# Patient Record
Sex: Male | Born: 1960 | Race: Black or African American | Hispanic: No | Marital: Single | Smoking: Current every day smoker
Health system: Southern US, Community
[De-identification: ages and names within clinical notes are randomized; demographics above are authoritative.]

## PROBLEM LIST (undated history)

## (undated) DIAGNOSIS — A630 Anogenital (venereal) warts: Secondary | ICD-10-CM

## (undated) DIAGNOSIS — B20 Human immunodeficiency virus [HIV] disease: Secondary | ICD-10-CM

## (undated) DIAGNOSIS — M4802 Spinal stenosis, cervical region: Secondary | ICD-10-CM

## (undated) DIAGNOSIS — Z21 Asymptomatic human immunodeficiency virus [HIV] infection status: Secondary | ICD-10-CM

## (undated) HISTORY — PX: TENDON REPAIR: SHX5111

## (undated) HISTORY — DX: Anogenital (venereal) warts: A63.0

---

## 1995-01-10 HISTORY — PX: HERNIA REPAIR: SHX51

## 2001-05-21 ENCOUNTER — Encounter: Payer: Self-pay | Admitting: Emergency Medicine

## 2001-05-21 ENCOUNTER — Emergency Department (HOSPITAL_COMMUNITY): Admission: EM | Admit: 2001-05-21 | Discharge: 2001-05-21 | Payer: Self-pay | Admitting: Emergency Medicine

## 2001-05-23 ENCOUNTER — Emergency Department (HOSPITAL_COMMUNITY): Admission: EM | Admit: 2001-05-23 | Discharge: 2001-05-24 | Payer: Self-pay | Admitting: Emergency Medicine

## 2001-05-24 ENCOUNTER — Encounter: Payer: Self-pay | Admitting: Emergency Medicine

## 2001-05-27 ENCOUNTER — Encounter: Payer: Self-pay | Admitting: Emergency Medicine

## 2001-05-27 ENCOUNTER — Emergency Department (HOSPITAL_COMMUNITY): Admission: EM | Admit: 2001-05-27 | Discharge: 2001-05-27 | Payer: Self-pay | Admitting: Emergency Medicine

## 2002-01-25 ENCOUNTER — Emergency Department (HOSPITAL_COMMUNITY): Admission: EM | Admit: 2002-01-25 | Discharge: 2002-01-26 | Payer: Self-pay | Admitting: Emergency Medicine

## 2002-02-07 ENCOUNTER — Inpatient Hospital Stay (HOSPITAL_COMMUNITY): Admission: AD | Admit: 2002-02-07 | Discharge: 2002-02-11 | Payer: Self-pay | Admitting: Psychiatry

## 2002-03-25 ENCOUNTER — Encounter: Payer: Self-pay | Admitting: Emergency Medicine

## 2002-03-25 ENCOUNTER — Inpatient Hospital Stay (HOSPITAL_COMMUNITY): Admission: EM | Admit: 2002-03-25 | Discharge: 2002-03-31 | Payer: Self-pay | Admitting: Psychiatry

## 2002-04-20 ENCOUNTER — Emergency Department (HOSPITAL_COMMUNITY): Admission: EM | Admit: 2002-04-20 | Discharge: 2002-04-20 | Payer: Self-pay | Admitting: Emergency Medicine

## 2002-06-17 ENCOUNTER — Encounter: Payer: Self-pay | Admitting: *Deleted

## 2002-06-18 ENCOUNTER — Inpatient Hospital Stay (HOSPITAL_COMMUNITY): Admission: EM | Admit: 2002-06-18 | Discharge: 2002-06-26 | Payer: Self-pay | Admitting: Psychiatry

## 2002-06-27 ENCOUNTER — Emergency Department (HOSPITAL_COMMUNITY): Admission: EM | Admit: 2002-06-27 | Discharge: 2002-06-27 | Payer: Self-pay | Admitting: Emergency Medicine

## 2002-07-18 ENCOUNTER — Encounter: Payer: Self-pay | Admitting: Emergency Medicine

## 2002-07-18 ENCOUNTER — Inpatient Hospital Stay (HOSPITAL_COMMUNITY): Admission: EM | Admit: 2002-07-18 | Discharge: 2002-07-25 | Payer: Self-pay | Admitting: Psychiatry

## 2003-02-17 ENCOUNTER — Encounter: Payer: Self-pay | Admitting: *Deleted

## 2003-02-18 ENCOUNTER — Inpatient Hospital Stay (HOSPITAL_COMMUNITY): Admission: EM | Admit: 2003-02-18 | Discharge: 2003-02-24 | Payer: Self-pay | Admitting: Psychiatry

## 2003-02-18 ENCOUNTER — Encounter (HOSPITAL_COMMUNITY): Payer: Self-pay | Admitting: Psychiatry

## 2003-03-25 ENCOUNTER — Emergency Department (HOSPITAL_COMMUNITY): Admission: EM | Admit: 2003-03-25 | Discharge: 2003-03-25 | Payer: Self-pay | Admitting: Emergency Medicine

## 2003-04-04 ENCOUNTER — Emergency Department (HOSPITAL_COMMUNITY): Admission: EM | Admit: 2003-04-04 | Discharge: 2003-04-04 | Payer: Self-pay | Admitting: Emergency Medicine

## 2003-05-12 ENCOUNTER — Emergency Department (HOSPITAL_COMMUNITY): Admission: EM | Admit: 2003-05-12 | Discharge: 2003-05-12 | Payer: Self-pay | Admitting: Emergency Medicine

## 2003-10-14 ENCOUNTER — Inpatient Hospital Stay (HOSPITAL_COMMUNITY): Admission: EM | Admit: 2003-10-14 | Discharge: 2003-10-22 | Payer: Self-pay | Admitting: Psychiatry

## 2003-10-14 ENCOUNTER — Ambulatory Visit: Payer: Self-pay | Admitting: Psychiatry

## 2003-10-26 ENCOUNTER — Ambulatory Visit: Payer: Self-pay | Admitting: Internal Medicine

## 2003-11-05 ENCOUNTER — Ambulatory Visit: Payer: Self-pay | Admitting: Internal Medicine

## 2003-11-17 ENCOUNTER — Ambulatory Visit: Payer: Self-pay | Admitting: Internal Medicine

## 2003-12-09 ENCOUNTER — Emergency Department (HOSPITAL_COMMUNITY): Admission: EM | Admit: 2003-12-09 | Discharge: 2003-12-09 | Payer: Self-pay | Admitting: Emergency Medicine

## 2004-01-31 ENCOUNTER — Inpatient Hospital Stay (HOSPITAL_COMMUNITY): Admission: EM | Admit: 2004-01-31 | Discharge: 2004-02-11 | Payer: Self-pay | Admitting: Psychiatry

## 2004-01-31 ENCOUNTER — Encounter: Payer: Self-pay | Admitting: Emergency Medicine

## 2004-01-31 ENCOUNTER — Ambulatory Visit: Payer: Self-pay | Admitting: Psychiatry

## 2004-03-05 ENCOUNTER — Inpatient Hospital Stay (HOSPITAL_COMMUNITY): Admission: EM | Admit: 2004-03-05 | Discharge: 2004-03-11 | Payer: Self-pay | Admitting: Psychiatry

## 2004-03-05 ENCOUNTER — Ambulatory Visit: Payer: Self-pay | Admitting: Psychiatry

## 2004-03-15 ENCOUNTER — Emergency Department (HOSPITAL_COMMUNITY): Admission: EM | Admit: 2004-03-15 | Discharge: 2004-03-15 | Payer: Self-pay | Admitting: Cardiology

## 2004-04-13 ENCOUNTER — Emergency Department (HOSPITAL_COMMUNITY): Admission: EM | Admit: 2004-04-13 | Discharge: 2004-04-13 | Payer: Self-pay | Admitting: Emergency Medicine

## 2004-05-28 ENCOUNTER — Ambulatory Visit: Payer: Self-pay | Admitting: Psychiatry

## 2004-05-28 ENCOUNTER — Encounter: Payer: Self-pay | Admitting: Emergency Medicine

## 2004-05-28 ENCOUNTER — Inpatient Hospital Stay (HOSPITAL_COMMUNITY): Admission: AD | Admit: 2004-05-28 | Discharge: 2004-06-06 | Payer: Self-pay | Admitting: Psychiatry

## 2004-06-25 ENCOUNTER — Emergency Department (HOSPITAL_COMMUNITY): Admission: EM | Admit: 2004-06-25 | Discharge: 2004-06-25 | Payer: Self-pay | Admitting: Emergency Medicine

## 2004-07-08 ENCOUNTER — Emergency Department (HOSPITAL_COMMUNITY): Admission: EM | Admit: 2004-07-08 | Discharge: 2004-07-08 | Payer: Self-pay | Admitting: Emergency Medicine

## 2004-08-01 ENCOUNTER — Emergency Department (HOSPITAL_COMMUNITY): Admission: EM | Admit: 2004-08-01 | Discharge: 2004-08-01 | Payer: Self-pay | Admitting: Emergency Medicine

## 2004-10-21 ENCOUNTER — Inpatient Hospital Stay (HOSPITAL_COMMUNITY): Admission: EM | Admit: 2004-10-21 | Discharge: 2004-10-23 | Payer: Self-pay | Admitting: Emergency Medicine

## 2004-10-31 ENCOUNTER — Encounter: Admission: RE | Admit: 2004-10-31 | Discharge: 2005-01-29 | Payer: Self-pay | Admitting: Orthopedic Surgery

## 2005-01-14 ENCOUNTER — Emergency Department (HOSPITAL_COMMUNITY): Admission: EM | Admit: 2005-01-14 | Discharge: 2005-01-14 | Payer: Self-pay | Admitting: Emergency Medicine

## 2005-03-03 ENCOUNTER — Emergency Department (HOSPITAL_COMMUNITY): Admission: EM | Admit: 2005-03-03 | Discharge: 2005-03-03 | Payer: Self-pay | Admitting: Emergency Medicine

## 2005-03-05 ENCOUNTER — Emergency Department (HOSPITAL_COMMUNITY): Admission: EM | Admit: 2005-03-05 | Discharge: 2005-03-06 | Payer: Self-pay | Admitting: Emergency Medicine

## 2005-07-16 ENCOUNTER — Emergency Department (HOSPITAL_COMMUNITY): Admission: EM | Admit: 2005-07-16 | Discharge: 2005-07-17 | Payer: Self-pay | Admitting: Emergency Medicine

## 2005-10-19 ENCOUNTER — Ambulatory Visit: Payer: Self-pay | Admitting: Internal Medicine

## 2006-03-16 ENCOUNTER — Inpatient Hospital Stay (HOSPITAL_COMMUNITY): Admission: AD | Admit: 2006-03-16 | Discharge: 2006-03-21 | Payer: Self-pay | Admitting: Psychiatry

## 2006-03-16 ENCOUNTER — Ambulatory Visit: Payer: Self-pay | Admitting: Psychiatry

## 2006-10-22 ENCOUNTER — Ambulatory Visit: Payer: Self-pay | Admitting: Internal Medicine

## 2006-10-22 LAB — CONVERTED CEMR LAB
ALT: 18 units/L (ref 0–53)
AST: 29 units/L (ref 0–37)
Absolute CD4: 289 #/uL — ABNORMAL LOW (ref 381–1469)
Albumin: 4.8 g/dL (ref 3.5–5.2)
Alkaline Phosphatase: 49 units/L (ref 39–117)
BUN: 12 mg/dL (ref 6–23)
Basophils Absolute: 0 10*3/uL (ref 0.0–0.1)
Basophils Relative: 1 % (ref 0–1)
CD4 T Helper %: 16 % — ABNORMAL LOW (ref 32–62)
CO2: 24 meq/L (ref 19–32)
Calcium: 9.7 mg/dL (ref 8.4–10.5)
Chloride: 102 meq/L (ref 96–112)
Creatinine, Ser: 1.19 mg/dL (ref 0.40–1.50)
Eosinophils Absolute: 0.1 10*3/uL (ref 0.0–0.7)
Eosinophils Relative: 2 % (ref 0–5)
Glucose, Bld: 85 mg/dL (ref 70–99)
HCT: 44.7 % (ref 39.0–52.0)
HIV 1 RNA Quant: <50 copies/mL (ref <50)
HIV-1 RNA Quant, Log: <1.7 (ref <1.70)
Hemoglobin: 14 g/dL (ref 13.0–17.0)
Lymphocytes Relative: 43 % (ref 12–46)
Lymphs Abs: 1.8 10*3/uL (ref 0.7–3.3)
MCHC: 31.3 g/dL (ref 30.0–36.0)
MCV: 89.2 fL (ref 78.0–100.0)
Monocytes Absolute: 0.4 10*3/uL (ref 0.2–0.7)
Monocytes Relative: 9 % (ref 3–11)
Neutro Abs: 1.9 10*3/uL (ref 1.7–7.7)
Neutrophils Relative %: 45 % (ref 43–77)
Platelets: 311 10*3/uL (ref 150–400)
Potassium: 4.4 meq/L (ref 3.5–5.3)
RBC: 5.01 M/uL (ref 4.22–5.81)
RDW: 15.2 % — ABNORMAL HIGH (ref 11.5–14.0)
Sodium: 139 meq/L (ref 135–145)
Testosterone: 392.52 ng/dL (ref 350–890)
Total Bilirubin: 0.2 mg/dL — ABNORMAL LOW (ref 0.3–1.2)
Total Lymphocytes %: 43 % (ref 12–46)
Total Protein: 8.1 g/dL (ref 6.0–8.3)
Total lymphocyte count: 1806 cells/mcL (ref 700–3300)
WBC, lymph enumeration: 4.2 10*3/uL (ref 4.0–10.5)
WBC: 4.2 10*3/uL (ref 4.0–10.5)

## 2007-01-30 ENCOUNTER — Ambulatory Visit: Payer: Self-pay | Admitting: Psychiatry

## 2007-01-30 ENCOUNTER — Inpatient Hospital Stay (HOSPITAL_COMMUNITY): Admission: EM | Admit: 2007-01-30 | Discharge: 2007-02-05 | Payer: Self-pay | Admitting: Psychiatry

## 2007-01-30 ENCOUNTER — Other Ambulatory Visit: Payer: Self-pay | Admitting: Emergency Medicine

## 2007-03-15 ENCOUNTER — Inpatient Hospital Stay (HOSPITAL_COMMUNITY): Admission: EM | Admit: 2007-03-15 | Discharge: 2007-03-20 | Payer: Self-pay | Admitting: Emergency Medicine

## 2007-03-15 ENCOUNTER — Ambulatory Visit: Payer: Self-pay | Admitting: Internal Medicine

## 2007-03-20 ENCOUNTER — Inpatient Hospital Stay (HOSPITAL_COMMUNITY): Admission: RE | Admit: 2007-03-20 | Discharge: 2007-03-25 | Payer: Self-pay | Admitting: Psychiatry

## 2007-03-20 ENCOUNTER — Ambulatory Visit: Payer: Self-pay | Admitting: Psychiatry

## 2007-08-28 ENCOUNTER — Ambulatory Visit: Payer: Self-pay | Admitting: Psychiatry

## 2007-08-28 ENCOUNTER — Inpatient Hospital Stay (HOSPITAL_COMMUNITY): Admission: AD | Admit: 2007-08-28 | Discharge: 2007-09-03 | Payer: Self-pay | Admitting: Psychiatry

## 2007-09-17 ENCOUNTER — Emergency Department (HOSPITAL_COMMUNITY): Admission: EM | Admit: 2007-09-17 | Discharge: 2007-09-17 | Payer: Self-pay | Admitting: Emergency Medicine

## 2007-11-04 ENCOUNTER — Other Ambulatory Visit: Payer: Self-pay | Admitting: Emergency Medicine

## 2007-11-04 ENCOUNTER — Ambulatory Visit: Payer: Self-pay | Admitting: Psychiatry

## 2007-11-05 ENCOUNTER — Inpatient Hospital Stay (HOSPITAL_COMMUNITY): Admission: AD | Admit: 2007-11-05 | Discharge: 2007-11-11 | Payer: Self-pay | Admitting: Psychiatry

## 2007-11-18 ENCOUNTER — Ambulatory Visit: Payer: Self-pay | Admitting: Internal Medicine

## 2007-11-18 LAB — CONVERTED CEMR LAB
ALT: 38 units/L (ref 0–53)
AST: 27 units/L (ref 0–37)
Absolute CD4: 307 #/uL — ABNORMAL LOW (ref 381–1469)
Albumin: 4.7 g/dL (ref 3.5–5.2)
Alkaline Phosphatase: 46 units/L (ref 39–117)
BUN: 17 mg/dL (ref 6–23)
Basophils Absolute: 0 10*3/uL (ref 0.0–0.1)
Basophils Relative: 0 % (ref 0–1)
CD4 T Helper %: 18 % — ABNORMAL LOW (ref 32–62)
CO2: 23 meq/L (ref 19–32)
Calcium: 9.8 mg/dL (ref 8.4–10.5)
Chlamydia, Swab/Urine, PCR: NEGATIVE
Chloride: 101 meq/L (ref 96–112)
Creatinine, Ser: 1.22 mg/dL (ref 0.40–1.50)
Eosinophils Absolute: 0.1 10*3/uL (ref 0.0–0.7)
Eosinophils Relative: 1 % (ref 0–5)
GC Probe Amp, Urine: NEGATIVE
Glucose, Bld: 96 mg/dL (ref 70–99)
HCT: 42 % (ref 39.0–52.0)
HIV 1 RNA Quant: 663 copies/mL — ABNORMAL HIGH (ref <48)
HIV-1 RNA Quant, Log: 2.82 — ABNORMAL HIGH (ref <1.68)
Hemoglobin: 13.6 g/dL (ref 13.0–17.0)
Lymphocytes Relative: 28 % (ref 12–46)
Lymphs Abs: 1.7 10*3/uL (ref 0.7–4.0)
MCHC: 32.4 g/dL (ref 30.0–36.0)
MCV: 90.1 fL (ref 78.0–100.0)
Monocytes Absolute: 0.5 10*3/uL (ref 0.1–1.0)
Monocytes Relative: 8 % (ref 3–12)
Neutro Abs: 3.8 10*3/uL (ref 1.7–7.7)
Neutrophils Relative %: 63 % (ref 43–77)
Platelets: 345 10*3/uL (ref 150–400)
Potassium: 4.3 meq/L (ref 3.5–5.3)
RBC: 4.66 M/uL (ref 4.22–5.81)
RDW: 15 % (ref 11.5–15.5)
Sodium: 136 meq/L (ref 135–145)
Total Bilirubin: 0.5 mg/dL (ref 0.3–1.2)
Total Lymphocytes %: 28 % (ref 12–46)
Total Protein: 7.9 g/dL (ref 6.0–8.3)
Total lymphocyte count: 1708 cells/mcL (ref 700–3300)
WBC, lymph enumeration: 6.1 10*3/uL (ref 4.0–10.5)
WBC: 6.1 10*3/uL (ref 4.0–10.5)

## 2007-11-23 ENCOUNTER — Other Ambulatory Visit: Payer: Self-pay | Admitting: Emergency Medicine

## 2007-11-23 ENCOUNTER — Inpatient Hospital Stay (HOSPITAL_COMMUNITY): Admission: EM | Admit: 2007-11-23 | Discharge: 2007-12-02 | Payer: Self-pay | Admitting: *Deleted

## 2008-07-28 ENCOUNTER — Emergency Department (HOSPITAL_COMMUNITY): Admission: EM | Admit: 2008-07-28 | Discharge: 2008-07-28 | Payer: Self-pay | Admitting: Emergency Medicine

## 2008-07-29 ENCOUNTER — Ambulatory Visit: Payer: Self-pay | Admitting: Psychiatry

## 2008-07-29 ENCOUNTER — Encounter: Payer: Self-pay | Admitting: Emergency Medicine

## 2008-07-29 ENCOUNTER — Inpatient Hospital Stay (HOSPITAL_COMMUNITY): Admission: AD | Admit: 2008-07-29 | Discharge: 2008-08-03 | Payer: Self-pay | Admitting: Psychiatry

## 2008-11-09 DIAGNOSIS — B20 Human immunodeficiency virus [HIV] disease: Secondary | ICD-10-CM

## 2009-02-16 ENCOUNTER — Other Ambulatory Visit: Payer: Self-pay | Admitting: Emergency Medicine

## 2009-02-17 ENCOUNTER — Ambulatory Visit: Payer: Self-pay | Admitting: Psychiatry

## 2009-02-17 ENCOUNTER — Inpatient Hospital Stay (HOSPITAL_COMMUNITY): Admission: AD | Admit: 2009-02-17 | Discharge: 2009-02-19 | Payer: Self-pay | Admitting: Psychiatry

## 2009-03-01 ENCOUNTER — Emergency Department (HOSPITAL_COMMUNITY): Admission: EM | Admit: 2009-03-01 | Discharge: 2009-03-01 | Payer: Self-pay | Admitting: Emergency Medicine

## 2009-07-15 ENCOUNTER — Telehealth: Payer: Self-pay | Admitting: Internal Medicine

## 2009-07-15 ENCOUNTER — Encounter: Payer: Self-pay | Admitting: Internal Medicine

## 2009-08-16 ENCOUNTER — Other Ambulatory Visit: Payer: Self-pay

## 2009-08-17 ENCOUNTER — Inpatient Hospital Stay (HOSPITAL_COMMUNITY): Admission: RE | Admit: 2009-08-17 | Discharge: 2009-08-20 | Payer: Self-pay | Admitting: Psychiatry

## 2009-08-17 ENCOUNTER — Ambulatory Visit: Payer: Self-pay | Admitting: Psychiatry

## 2009-12-01 ENCOUNTER — Ambulatory Visit: Payer: Self-pay | Admitting: Internal Medicine

## 2009-12-07 ENCOUNTER — Encounter: Payer: Self-pay | Admitting: Internal Medicine

## 2009-12-10 ENCOUNTER — Encounter: Payer: Self-pay | Admitting: Internal Medicine

## 2009-12-20 ENCOUNTER — Ambulatory Visit: Payer: Self-pay | Admitting: Internal Medicine

## 2009-12-23 ENCOUNTER — Emergency Department (HOSPITAL_COMMUNITY)
Admission: EM | Admit: 2009-12-23 | Discharge: 2009-12-24 | Disposition: A | Discharge status: Other Acute Inpatient Hospital | Payer: Self-pay | Source: Home / Self Care | Admitting: Emergency Medicine

## 2009-12-24 ENCOUNTER — Inpatient Hospital Stay (HOSPITAL_COMMUNITY)
Admission: RE | Admit: 2009-12-24 | Discharge: 2009-12-28 | Payer: Self-pay | Source: Home / Self Care | Attending: Psychiatry | Admitting: Psychiatry

## 2010-01-14 ENCOUNTER — Encounter: Payer: Self-pay | Admitting: Internal Medicine

## 2010-01-15 ENCOUNTER — Emergency Department (HOSPITAL_COMMUNITY)
Admission: EM | Admit: 2010-01-15 | Discharge: 2010-01-16 | Disposition: A | Discharge status: Other Acute Inpatient Hospital | Payer: Self-pay | Source: Home / Self Care | Admitting: Emergency Medicine

## 2010-01-24 LAB — BASIC METABOLIC PANEL
BUN: 15 mg/dL (ref 6–23)
CO2: 23 mEq/L (ref 19–32)
Calcium: 9 mg/dL (ref 8.4–10.5)
Chloride: 102 mEq/L (ref 96–112)
Creatinine, Ser: 1.28 mg/dL (ref 0.4–1.5)
GFR calc Af Amer: >60 mL/min (ref >60)
GFR calc non Af Amer: 60 mL/min — ABNORMAL LOW (ref >60)
Glucose, Bld: 95 mg/dL (ref 70–99)
Potassium: 4.2 mEq/L (ref 3.5–5.1)
Sodium: 136 mEq/L (ref 135–145)

## 2010-01-24 LAB — TRICYCLICS SCREEN, URINE: TCA Scrn: NOT DETECTED

## 2010-01-24 LAB — CBC
HCT: 40 % (ref 39.0–52.0)
Hemoglobin: 13.2 g/dL (ref 13.0–17.0)
MCH: 28.6 pg (ref 26.0–34.0)
MCHC: 33 g/dL (ref 30.0–36.0)
MCV: 86.8 fL (ref 78.0–100.0)
Platelets: 308 10*3/uL (ref 150–400)
RBC: 4.61 MIL/uL (ref 4.22–5.81)
RDW: 14.5 % (ref 11.5–15.5)
WBC: 6 10*3/uL (ref 4.0–10.5)

## 2010-01-24 LAB — DIFFERENTIAL
Basophils Absolute: 0 10*3/uL (ref 0.0–0.1)
Basophils Relative: 0 % (ref 0–1)
Eosinophils Absolute: 0.1 10*3/uL (ref 0.0–0.7)
Eosinophils Relative: 1 % (ref 0–5)
Lymphocytes Relative: 47 % — ABNORMAL HIGH (ref 12–46)
Lymphs Abs: 2.9 10*3/uL (ref 0.7–4.0)
Monocytes Absolute: 0.4 10*3/uL (ref 0.1–1.0)
Monocytes Relative: 6 % (ref 3–12)
Neutro Abs: 2.7 10*3/uL (ref 1.7–7.7)
Neutrophils Relative %: 45 % (ref 43–77)

## 2010-01-24 LAB — RAPID URINE DRUG SCREEN, HOSP PERFORMED
Amphetamines: NOT DETECTED
Barbiturates: NOT DETECTED
Benzodiazepines: NOT DETECTED
Cocaine: POSITIVE — AB
Opiates: POSITIVE — AB
Tetrahydrocannabinol: NOT DETECTED

## 2010-01-24 LAB — ETHANOL: Alcohol, Ethyl (B): 215 mg/dL — ABNORMAL HIGH (ref 0–10)

## 2010-02-08 NOTE — Consult Note (Signed)
Summary: [name]   Imported By: Florinda Marker 12/10/2009 14:43:20  _____________________________________________________________________  External Attachment:    Type:   Image     Comment:   External Document

## 2010-02-08 NOTE — Miscellaneous (Signed)
Summary: Orders Update  Clinical Lists Changes  Orders: Added new Test order of T-Hepatitis B Surface Antigen [phone]) - Signed Added new Test order of T-Hepatitis B Surface Antibody [phone]) - Signed Added new Test order of T-Hepatitis C Antibody ([id number]) - Signed Added new Test order of T-RPR (Syphilis) ([id number]) - Signed Added new Test order of T-CD4SP [facility]) (CD4SP) - Signed Added new Test order of T-HIV Viral Load [phone]) - Signed Added new Test order of T-Lipid Profile ([id number]) - Signed Added new Test order of T-CBC w/Diff ([id number]) - Signed Added new Test order of T-Comprehensive Metabolic Panel ([id number]) - Signed Added new Test order of T-HIV Ab Confirmatory Test/Western Blot [phone]) - Signed Added new Test order of T-Chlamydia  Probe, urine [phone]) - Signed Added new Test order of T-GC Probe, urine [phone]) - Signed Added new Test order of T-Urinalysis ([id number]) - Signed Added new Test order of T-Hepatitis B Core Antibody ([id number]) - Signed Added new Test order of T-HIV Genotype ([id number]) - Signed        Process Orders Check Orders Results:     [facility]: ABN not required for this insurance Order queued for requisitioning for [facility]: July 15, 2009 3:01 PM  Tests Sent for requisitioning (July 15, 2009 3:01 PM):     07/20/2009: [facility] -- T-Hepatitis B Surface Antigen [[id number]] (signed)     07/20/2009: [facility] -- T-Hepatitis B Surface Antibody [[id number]] (signed)     07/20/2009: [facility] -- T-Hepatitis C Antibody [[id number]] (signed)     07/20/2009: [facility] -- T-RPR (Syphilis) [phone] (signed)     07/20/2009: [facility] -- T-HIV Viral Load [phone] (signed)     07/20/2009: [facility] -- T-Lipid Profile [phone] (signed)     07/20/2009: [facility] -- T-CBC w/Diff [[id number]] (signed)     07/20/2009: [facility] -- T-Comprehensive Metabolic Panel [[id number]] (signed)     07/20/2009: [facility] -- T-HIV Ab Confirmatory Test/Western Blot [phone] (signed)     07/20/2009: [facility] -- T-Chlamydia  Probe, urine [phone] (signed)     07/20/2009: [facility] -- T-GC Probe, urine [id number] (signed)     07/20/2009: [facility] -- T-Urinalysis [[id number]] (signed)     07/20/2009: [facility] -- T-Hepatitis B Core Antibody [[id number]] (signed)     07/20/2009: [facility] -- T-HIV Genotype [phone] (signed)

## 2010-02-08 NOTE — Progress Notes (Signed)
Summary: requesting refill  Phone Note Call from Patient   Caller: Patient Reason for Call: Refill Medication Summary of Call: Pt. called requesting refill for HIV meds.  Said he saw Dr. Philipp Deputy a year ago and has been traveling between [place] and [place].  Meds last filled by Dr. Andria Meuse at [facility].  Explained he needed a lab visit and then an appt. with Dr. for 2 weeks later.  Pt. was upset and did not want to schedule appt. Initial call taken by: Wendall Mola CMA Duncan Dull),  July 15, 2009 2:40 PM     Appended Document: requesting refill Pt. called back and scheduled lab and office visit

## 2010-02-08 NOTE — Miscellaneous (Signed)
Summary: HIPAA Restrictions  HIPAA Restrictions   Imported By: Florinda Marker 12/07/2009 09:27:46  _____________________________________________________________________  External Attachment:    Type:   Image     Comment:   External Document

## 2010-02-08 NOTE — Assessment & Plan Note (Signed)
Summary: routine office visit/TY   CC:  pt. to reestablish, seen at [facility] spot on liver, right inguinal hernia, and out of meds.  History of Present Illness: patient is here to reestablish.  He has been seen at [facility] for his HIV care.  He states that he had a study that showed that he may have a hernia.  I told him that we will need to have a release of information so that we can get the results of that x-ray.  He needs refills on his HIV medication as well as his albuterol inhaler.  He also needs labs which we are unable to do today because of the holiday so he will return next week for his labs.  Preventive Screening-Counseling & Management  Alcohol-Tobacco     Alcohol drinks/day: 6     Alcohol type: beer     Smoking Status: current     Packs/Day: 0.5     Year Started: 1990  Caffeine-Diet-Exercise     Caffeine use/day: coffee, soda 2 per day     Does Patient Exercise: yes     Type of exercise: walking  Safety-Violence-Falls     Seat Belt Use: yes      Sexual History:  currently monogamous.        Drug Use:  former.    Comments: pt. declined condoms   Updated Prior Medication List: KALETRA 200-50 MG TABS (LOPINAVIR-RITONAVIR) take two tablets twice a day TRUVADA 200-300 MG TABS (EMTRICITABINE-TENOFOVIR) Take 1 tablet by mouth once a day PROVENTIL HFA 108 (90 BASE) MCG/ACT AERS (ALBUTEROL SULFATE) as directed WELLBUTRIN XL 300 MG XR24H-TAB (BUPROPION HCL) Take 1 tablet by mouth once a day  Current Allergies (reviewed today): ! SULFA Past History:  Past Medical History: Last updated: 11/09/2008 HIV disease  Social History: Sexual History:  currently monogamous Drug Use:  former  Review of Systems  The patient denies anorexia, fever, and weight loss.    Vital Signs:  Patient profile:   50 year old male Height:      72 inches (182.88 cm) Weight:      158.8 pounds (72.18 kg) BMI:     21.62 Temp:     97.7 degrees F (36.50 degrees C) oral Pulse rate:    94 / minute BP sitting:   143 / 86  (right arm)  Vitals Entered By: Wendall Mola CMA Duncan Dull) (December 01, 2009 3:06 PM) CC: pt. to reestablish,  seen at [facility] spot on liver, right inguinal hernia, out of meds Is Patient Diabetic? No Pain Assessment Patient in pain? no      Nutritional Status BMI of 19 -24 = normal Nutritional Status Detail appetite"good"  Have you ever been in a relationship where you felt threatened, hurt or afraid?No   Does patient need assistance? Functional Status Self care Ambulation Normal   Physical Exam  General:  alert, well-developed, well-nourished, and well-hydrated.   Head:  normocephalic and atraumatic.   Mouth:  pharynx pink and moist.  no thrush  Lungs:  normal breath sounds.     Impression & Recommendations:  Problem # 1:  HIV DISEASE (ICD-042) He will return next week for labs he is to continue his current regimen of Truvada and currently truck.  We will also obtain his old records. Diagnostics Reviewed:  WBC: 6.1 (11/18/2007)   Hgb: 13.6 (11/18/2007)   HCT: 42.0 (11/18/2007)   Platelets: 345 (11/18/2007) HIV-1 RNA: 663 (11/18/2007)     Orders: T-Hepatitis B Surface Antibody ([id number])  T-Hepatitis B Surface Antigen [phone]) T-Hepatitis C Viral Load [id number])  Medications Added to Medication List This Visit: 1)  Kaletra 200-50 Mg Tabs (Lopinavir-ritonavir) .... Take two tablets twice a day 2)  Truvada 200-300 Mg Tabs (Emtricitabine-tenofovir) .... Take 1 tablet by mouth once a day 3)  Proventil Hfa 108 (90 Base) Mcg/act Aers (Albuterol sulfate) .... As directed 4)  Wellbutrin Xl 300 Mg Xr24h-tab (Bupropion hcl) .... Take 1 tablet by mouth once a day  Other Orders: T-CD4SP [facility]) (CD4SP) T-HIV Viral Load [phone]) T-Comprehensive Metabolic Panel [phone]) T-CBC w/Diff [id number]) T-RPR (Syphilis) ([id number]) Est. Patient Level III (03474) Pneumococcal Vaccine (25956) Admin 1st Vaccine  (38756)  Patient Instructions: 1)  Please schedule a follow-up appointment in 2 weeks.  Prescriptions: PROVENTIL HFA 108 (90 BASE) MCG/ACT AERS (ALBUTEROL SULFATE) as directed  #1 x 5   Entered and Authorized by:   Yisroel Ramming MD   Signed by:   Yisroel Ramming MD on 12/01/2009   Method used:   Print then Give to Patient   RxID:   [id number] TRUVADA 200-300 MG TABS (EMTRICITABINE-TENOFOVIR) Take 1 tablet by mouth once a day  #30 x 5   Entered and Authorized by:   Yisroel Ramming MD   Signed by:   Yisroel Ramming MD on 12/01/2009   Method used:   Print then Give to Patient   RxID:   [id number] KALETRA 200-50 MG TABS (LOPINAVIR-RITONAVIR) take two tablets twice a day  #120 x 5   Entered and Authorized by:   Yisroel Ramming MD   Signed by:   Yisroel Ramming MD on 12/01/2009   Method used:   Print then Give to Patient   RxID:   [id number]    Immunization History:  Influenza Immunization History:    Influenza:  historical (10/13/2009)  Immunizations Administered:  Pneumonia Vaccine:    Vaccine Type: Pneumovax    Site: right deltoid    Mfr: Merck    Dose: 0.5 ml    Route: IM    Given by: Wendall Mola CMA ( AAMA)    Exp. Date: 05/11/2011    Lot #: [id number]    VIS given: 12/14/08 version given December 01, 2009.

## 2010-02-10 NOTE — Miscellaneous (Signed)
Summary: RW Update  Clinical Lists Changes  Observations: Added new observation of RWPARTICIP: Yes (01/14/2010 10:09)

## 2010-02-10 NOTE — Miscellaneous (Signed)
Summary: [facility].  [employer].   Imported By: Florinda Marker 12/21/2009 09:56:01  _____________________________________________________________________  External Attachment:    Type:   Image     Comment:   External Document

## 2010-02-17 ENCOUNTER — Encounter (INDEPENDENT_AMBULATORY_CARE_PROVIDER_SITE_OTHER): Payer: Self-pay | Admitting: *Deleted

## 2010-02-22 ENCOUNTER — Emergency Department (HOSPITAL_COMMUNITY)
Admission: EM | Admit: 2010-02-22 | Discharge: 2010-02-24 | Disposition: A | Discharge status: Other Acute Inpatient Hospital | Payer: Medicare Other | Attending: Emergency Medicine | Admitting: Emergency Medicine

## 2010-02-22 DIAGNOSIS — Z21 Asymptomatic human immunodeficiency virus [HIV] infection status: Secondary | ICD-10-CM | POA: Insufficient documentation

## 2010-02-22 DIAGNOSIS — F329 Major depressive disorder, single episode, unspecified: Secondary | ICD-10-CM | POA: Insufficient documentation

## 2010-02-22 DIAGNOSIS — R45851 Suicidal ideations: Secondary | ICD-10-CM | POA: Insufficient documentation

## 2010-02-22 DIAGNOSIS — F17200 Nicotine dependence, unspecified, uncomplicated: Secondary | ICD-10-CM | POA: Insufficient documentation

## 2010-02-22 DIAGNOSIS — F3289 Other specified depressive episodes: Secondary | ICD-10-CM | POA: Insufficient documentation

## 2010-02-22 DIAGNOSIS — F1010 Alcohol abuse, uncomplicated: Secondary | ICD-10-CM | POA: Insufficient documentation

## 2010-02-22 DIAGNOSIS — F1020 Alcohol dependence, uncomplicated: Secondary | ICD-10-CM | POA: Insufficient documentation

## 2010-02-22 DIAGNOSIS — F1910 Other psychoactive substance abuse, uncomplicated: Secondary | ICD-10-CM | POA: Insufficient documentation

## 2010-02-22 LAB — RAPID URINE DRUG SCREEN, HOSP PERFORMED
Amphetamines: NOT DETECTED
Tetrahydrocannabinol: NOT DETECTED

## 2010-02-22 LAB — DIFFERENTIAL
Eosinophils Relative: 1 % (ref 0–5)
Lymphocytes Relative: 36 % (ref 12–46)
Lymphs Abs: 1.5 10*3/uL (ref 0.7–4.0)
Monocytes Absolute: 0.3 10*3/uL (ref 0.1–1.0)
Monocytes Relative: 8 % (ref 3–12)
Neutro Abs: 2.3 10*3/uL (ref 1.7–7.7)

## 2010-02-22 LAB — COMPREHENSIVE METABOLIC PANEL
ALT: 24 U/L (ref 0–53)
Albumin: 4.1 g/dL (ref 3.5–5.2)
Alkaline Phosphatase: 45 U/L (ref 39–117)
BUN: 15 mg/dL (ref 6–23)
Calcium: 9.5 mg/dL (ref 8.4–10.5)
Glucose, Bld: 119 mg/dL — ABNORMAL HIGH (ref 70–99)
Potassium: 3.6 mEq/L (ref 3.5–5.1)
Sodium: 133 mEq/L — ABNORMAL LOW (ref 135–145)
Total Protein: 8.2 g/dL (ref 6.0–8.3)

## 2010-02-22 LAB — ETHANOL: Alcohol, Ethyl (B): 14 mg/dL — ABNORMAL HIGH (ref 0–10)

## 2010-02-22 LAB — CBC
HCT: 41.3 % (ref 39.0–52.0)
Hemoglobin: 13.9 g/dL (ref 13.0–17.0)
MCHC: 33.7 g/dL (ref 30.0–36.0)
MCV: 86.2 fL (ref 78.0–100.0)
RDW: 14.1 % (ref 11.5–15.5)

## 2010-02-23 DIAGNOSIS — F1920 Other psychoactive substance dependence, uncomplicated: Secondary | ICD-10-CM

## 2010-02-24 NOTE — Miscellaneous (Signed)
  Clinical Lists Changes  Observations: Added new observation of PAYOR: Private (02/17/2010 14:18)

## 2010-02-24 NOTE — Miscellaneous (Signed)
  Clinical Lists Changes  Observations: Added new observation of RACE: African American (02/17/2010 11:54) Added new observation of LATINO/HISP: No (02/17/2010 11:54)

## 2010-02-24 NOTE — Miscellaneous (Signed)
  Clinical Lists Changes  Observations: Added new observation of [place]: Medicaid (02/17/2010 14:40) Added new observation of COMMERCINSUR: Private (02/17/2010 14:40) Added new observation of PAYOR: More than 1 (02/17/2010 14:40)

## 2010-02-28 ENCOUNTER — Encounter (INDEPENDENT_AMBULATORY_CARE_PROVIDER_SITE_OTHER): Payer: Self-pay | Admitting: *Deleted

## 2010-03-08 NOTE — Miscellaneous (Signed)
  Clinical Lists Changes  Observations: Added new observation of HIV RISK BEH: MSM (02/28/2010 11:04)

## 2010-03-14 ENCOUNTER — Emergency Department (HOSPITAL_COMMUNITY)
Admission: EM | Admit: 2010-03-14 | Discharge: 2010-03-16 | Disposition: A | Discharge status: Home / Self Care | Payer: Medicare Other | Attending: Emergency Medicine | Admitting: Emergency Medicine

## 2010-03-14 DIAGNOSIS — R45851 Suicidal ideations: Secondary | ICD-10-CM | POA: Insufficient documentation

## 2010-03-14 DIAGNOSIS — F1010 Alcohol abuse, uncomplicated: Secondary | ICD-10-CM | POA: Insufficient documentation

## 2010-03-14 LAB — BASIC METABOLIC PANEL
CO2: 28 mEq/L (ref 19–32)
Calcium: 9.3 mg/dL (ref 8.4–10.5)
Creatinine, Ser: 1.08 mg/dL (ref 0.4–1.5)
GFR calc Af Amer: >60 mL/min (ref >60)
GFR calc non Af Amer: >60 mL/min (ref >60)
Sodium: 139 mEq/L (ref 135–145)

## 2010-03-14 LAB — CBC
HCT: 40.5 % (ref 39.0–52.0)
Hemoglobin: 13.1 g/dL (ref 13.0–17.0)
MCH: 28.2 pg (ref 26.0–34.0)
MCHC: 32.3 g/dL (ref 30.0–36.0)
MCV: 87.1 fL (ref 78.0–100.0)
RBC: 4.65 MIL/uL (ref 4.22–5.81)

## 2010-03-14 LAB — DIFFERENTIAL
Basophils Relative: 0 % (ref 0–1)
Lymphs Abs: 2.9 10*3/uL (ref 0.7–4.0)
Monocytes Absolute: 0.4 10*3/uL (ref 0.1–1.0)
Monocytes Relative: 5 % (ref 3–12)
Neutro Abs: 4.1 10*3/uL (ref 1.7–7.7)
Neutrophils Relative %: 55 % (ref 43–77)

## 2010-03-14 LAB — RAPID URINE DRUG SCREEN, HOSP PERFORMED
Amphetamines: NOT DETECTED
Barbiturates: NOT DETECTED
Benzodiazepines: NOT DETECTED
Tetrahydrocannabinol: NOT DETECTED

## 2010-03-14 LAB — ETHANOL: Alcohol, Ethyl (B): 224 mg/dL — ABNORMAL HIGH (ref 0–10)

## 2010-03-15 DIAGNOSIS — F1910 Other psychoactive substance abuse, uncomplicated: Secondary | ICD-10-CM

## 2010-03-15 LAB — COMPREHENSIVE METABOLIC PANEL
ALT: 21 U/L (ref 0–53)
Albumin: 3.6 g/dL (ref 3.5–5.2)
Alkaline Phosphatase: 41 U/L (ref 39–117)
Calcium: 9.2 mg/dL (ref 8.4–10.5)
GFR calc Af Amer: >60 mL/min (ref >60)
Glucose, Bld: 97 mg/dL (ref 70–99)
Potassium: 4.3 mEq/L (ref 3.5–5.1)
Sodium: 138 mEq/L (ref 135–145)
Total Protein: 7.4 g/dL (ref 6.0–8.3)

## 2010-03-16 DIAGNOSIS — F1010 Alcohol abuse, uncomplicated: Secondary | ICD-10-CM

## 2010-03-21 LAB — RAPID URINE DRUG SCREEN, HOSP PERFORMED
Amphetamines: NOT DETECTED
Barbiturates: NOT DETECTED
Benzodiazepines: NOT DETECTED
Tetrahydrocannabinol: NOT DETECTED

## 2010-03-21 LAB — HEPATIC FUNCTION PANEL
ALT: 25 U/L (ref 0–53)
Bilirubin, Direct: 0.1 mg/dL (ref 0.0–0.3)
Indirect Bilirubin: 0.8 mg/dL (ref 0.3–0.9)

## 2010-03-21 LAB — URINALYSIS, ROUTINE W REFLEX MICROSCOPIC
Bilirubin Urine: NEGATIVE
Hgb urine dipstick: NEGATIVE
Nitrite: NEGATIVE
Protein, ur: NEGATIVE mg/dL
Urobilinogen, UA: 0.2 mg/dL (ref 0.0–1.0)

## 2010-03-21 LAB — ETHANOL
Alcohol, Ethyl (B): 235 mg/dL — ABNORMAL HIGH (ref 0–10)
Alcohol, Ethyl (B): 7 mg/dL (ref 0–10)

## 2010-03-21 LAB — CBC
MCH: 28.9 pg (ref 26.0–34.0)
MCV: 85.9 fL (ref 78.0–100.0)
Platelets: 250 10*3/uL (ref 150–400)
RDW: 14.4 % (ref 11.5–15.5)

## 2010-03-21 LAB — BASIC METABOLIC PANEL
BUN: 16 mg/dL (ref 6–23)
CO2: 21 mEq/L (ref 19–32)
Calcium: 9.1 mg/dL (ref 8.4–10.5)
Chloride: 102 mEq/L (ref 96–112)
Creatinine, Ser: 1.5 mg/dL (ref 0.4–1.5)

## 2010-03-21 LAB — DIFFERENTIAL
Basophils Absolute: 0 10*3/uL (ref 0.0–0.1)
Basophils Relative: 0 % (ref 0–1)
Eosinophils Absolute: 0 10*3/uL (ref 0.0–0.7)
Eosinophils Relative: 1 % (ref 0–5)

## 2010-03-25 LAB — URINALYSIS, ROUTINE W REFLEX MICROSCOPIC
Glucose, UA: NEGATIVE mg/dL
Ketones, ur: NEGATIVE mg/dL
pH: 5 (ref 5.0–8.0)

## 2010-03-25 LAB — DIFFERENTIAL
Lymphocytes Relative: 35 % (ref 12–46)
Lymphs Abs: 2.3 10*3/uL (ref 0.7–4.0)
Monocytes Absolute: 0.5 10*3/uL (ref 0.1–1.0)
Monocytes Relative: 7 % (ref 3–12)
Neutro Abs: 3.7 10*3/uL (ref 1.7–7.7)

## 2010-03-25 LAB — CBC
HCT: 39.5 % (ref 39.0–52.0)
Hemoglobin: 13.2 g/dL (ref 13.0–17.0)
MCHC: 33.5 g/dL (ref 30.0–36.0)
WBC: 6.5 10*3/uL (ref 4.0–10.5)

## 2010-03-25 LAB — HEPATIC FUNCTION PANEL
ALT: 28 U/L (ref 0–53)
AST: 37 U/L (ref 0–37)
Alkaline Phosphatase: 35 U/L — ABNORMAL LOW (ref 39–117)
Bilirubin, Direct: 0.1 mg/dL (ref 0.0–0.3)
Total Bilirubin: 0.8 mg/dL (ref 0.3–1.2)

## 2010-03-25 LAB — BASIC METABOLIC PANEL
GFR calc non Af Amer: >60 mL/min (ref >60)
Glucose, Bld: 110 mg/dL — ABNORMAL HIGH (ref 70–99)
Potassium: 3.4 mEq/L — ABNORMAL LOW (ref 3.5–5.1)
Sodium: 136 mEq/L (ref 135–145)

## 2010-03-25 LAB — RAPID URINE DRUG SCREEN, HOSP PERFORMED
Barbiturates: NOT DETECTED
Cocaine: POSITIVE — AB
Opiates: NOT DETECTED

## 2010-03-30 LAB — POCT I-STAT, CHEM 8
BUN: 13 mg/dL (ref 6–23)
Creatinine, Ser: 1.3 mg/dL (ref 0.4–1.5)
Hemoglobin: 14.6 g/dL (ref 13.0–17.0)
Potassium: 3.8 mEq/L (ref 3.5–5.1)
Sodium: 137 mEq/L (ref 135–145)

## 2010-03-30 LAB — COMPREHENSIVE METABOLIC PANEL
ALT: 25 U/L (ref 0–53)
AST: 32 U/L (ref 0–37)
Alkaline Phosphatase: 49 U/L (ref 39–117)
CO2: 24 mEq/L (ref 19–32)
Chloride: 101 mEq/L (ref 96–112)
GFR calc Af Amer: >60 mL/min (ref >60)
GFR calc non Af Amer: >60 mL/min (ref >60)
Potassium: 3.9 mEq/L (ref 3.5–5.1)
Sodium: 135 mEq/L (ref 135–145)
Total Bilirubin: 0.5 mg/dL (ref 0.3–1.2)

## 2010-03-30 LAB — URINALYSIS, ROUTINE W REFLEX MICROSCOPIC
Hgb urine dipstick: NEGATIVE
Protein, ur: NEGATIVE mg/dL
Urobilinogen, UA: 0.2 mg/dL (ref 0.0–1.0)

## 2010-03-30 LAB — CBC
MCHC: 33.3 g/dL (ref 30.0–36.0)
MCV: 88.2 fL (ref 78.0–100.0)
MCV: 88.5 fL (ref 78.0–100.0)
Platelets: 282 10*3/uL (ref 150–400)
RBC: 4.51 MIL/uL (ref 4.22–5.81)
WBC: 8.6 10*3/uL (ref 4.0–10.5)

## 2010-03-30 LAB — ETHANOL: Alcohol, Ethyl (B): 53 mg/dL — ABNORMAL HIGH (ref 0–10)

## 2010-03-30 LAB — POCT CARDIAC MARKERS
CKMB, poc: 2.1 ng/mL (ref 1.0–8.0)
Myoglobin, poc: 66.5 ng/mL (ref 12–200)

## 2010-03-30 LAB — RAPID URINE DRUG SCREEN, HOSP PERFORMED
Amphetamines: NOT DETECTED
Barbiturates: NOT DETECTED

## 2010-03-30 LAB — DIFFERENTIAL
Basophils Absolute: 0.1 10*3/uL (ref 0.0–0.1)
Eosinophils Absolute: 0.1 10*3/uL (ref 0.0–0.7)
Eosinophils Relative: 1 % (ref 0–5)
Lymphocytes Relative: 32 % (ref 12–46)
Lymphs Abs: 1.6 10*3/uL (ref 0.7–4.0)
Neutro Abs: 2.9 10*3/uL (ref 1.7–7.7)
Neutrophils Relative %: 57 % (ref 43–77)

## 2010-04-17 LAB — DIFFERENTIAL
Basophils Absolute: 0 10*3/uL (ref 0.0–0.1)
Eosinophils Relative: 3 % (ref 0–5)
Lymphocytes Relative: 37 % (ref 12–46)
Lymphs Abs: 2.2 10*3/uL (ref 0.7–4.0)
Neutro Abs: 3.2 10*3/uL (ref 1.7–7.7)

## 2010-04-17 LAB — HEPATIC FUNCTION PANEL
ALT: 25 U/L (ref 0–53)
AST: 38 U/L — ABNORMAL HIGH (ref 0–37)
Albumin: 3.8 g/dL (ref 3.5–5.2)
Alkaline Phosphatase: 37 U/L — ABNORMAL LOW (ref 39–117)
Indirect Bilirubin: 0.5 mg/dL (ref 0.3–0.9)
Total Protein: 6.8 g/dL (ref 6.0–8.3)

## 2010-04-17 LAB — CBC
HCT: 40.6 % (ref 39.0–52.0)
Platelets: 295 10*3/uL (ref 150–400)
RDW: 13.8 % (ref 11.5–15.5)
WBC: 5.9 10*3/uL (ref 4.0–10.5)

## 2010-04-17 LAB — BASIC METABOLIC PANEL
BUN: 11 mg/dL (ref 6–23)
Calcium: 8.8 mg/dL (ref 8.4–10.5)
GFR calc non Af Amer: >60 mL/min (ref >60)
Potassium: 4.3 mEq/L (ref 3.5–5.1)
Sodium: 140 mEq/L (ref 135–145)

## 2010-04-17 LAB — URINALYSIS, ROUTINE W REFLEX MICROSCOPIC
Bilirubin Urine: NEGATIVE
Ketones, ur: NEGATIVE mg/dL
Nitrite: NEGATIVE
Protein, ur: NEGATIVE mg/dL

## 2010-04-17 LAB — RAPID URINE DRUG SCREEN, HOSP PERFORMED
Benzodiazepines: NOT DETECTED
Tetrahydrocannabinol: POSITIVE — AB

## 2010-04-17 LAB — ACETAMINOPHEN LEVEL: Acetaminophen (Tylenol), Serum: <10 ug/mL — ABNORMAL LOW (ref 10–30)

## 2010-04-17 LAB — ETHANOL: Alcohol, Ethyl (B): 107 mg/dL — ABNORMAL HIGH (ref 0–10)

## 2010-05-24 NOTE — H&P (Signed)
[name], [name] NO.:  [id number]   MEDICAL RECORD NO.:  [id number]          PATIENT TYPE:  IPS   LOCATION:  0303                          FACILITY:  [facility]   PHYSICIAN:  Anselm Jungling, MD  DATE OF BIRTH:  18-Mar-1960   DATE OF ADMISSION:  01/30/2007  DATE OF DISCHARGE:                       PSYCHIATRIC ADMISSION ASSESSMENT   IDENTIFYING INFORMATION:  This is a 50 year old African American male  who is single.  This is a voluntary admission.   HISTORY OF PRESENT ILLNESS:  This is one of several admissions for this  50 year old gentleman who is well-known to [place], who was last here in  March 2008.  He reports that he developed suicidal thoughts after a  couple of severe losses in his life.  His partner with whom he shared an  apartment died suddenly of a heart attack in November 2008, and then his  nephew, who was 81 years old, was shot to death in [place] along with  his girlfriend and buried last week.  After the loss of his partner and  his nephew's death, he felt unable to go on, relapsed on cocaine  approximately 3 days ago and had been using alcohol to cope after about  a year of abstinence.  He reports he has been drinking somewhere between  two and six 24- ounce beers daily and last drank six 24-ounce beers on  the day prior to admission.  Cocaine use has been sporadic.  For 2 days  he has had suicidal thoughts of overdosing on pills.  He has taken  antidepressants in the past but stopped it after he was feeling better  about 8 months ago.  He is denying any homicidal thoughts today, denying  active suicidal thoughts today.   PAST PSYCHIATRIC HISTORY:  The patient has a history of polysubstance  abuse and depression, at least two prior suicide attempts, at least one  by overdose, the last one in 1996.  He has a history of several  admissions to [facility] with his last one being March 7 to 12, 2008.  Longest  abstinence since his early 47s has been up to 2  years at a time.   SOCIAL HISTORY:  A single African American male, disabled due to a  medical condition,  has supportive family in the area.  Previously was  living with a partner, now having some financial stress on his  disability check, not sure that he can continue paying for the apartment  on his own.  No legal charges.  He has an adequate support network in  his group of friends.  He is aware of resources for HIV support in the  area.   MEDICAL HISTORY:  The patient is followed by Dr. Yisroel Ramming at the  [facility] infectious disease clinic.   CURRENT MEDICAL PROBLEMS:  HIV.   CURRENT MEDICATIONS:  Truvada 1 tablet daily and Kaletra 2 tablets p.o.  b.i.d.   DRUG ALLERGIES:  SULFA, BACTRIM, DAPSONE, AND CLINDAMYCIN.   Positive physical findings, physical exam and review of systems done in  the emergency room.   DIAGNOSTIC STUDIES:  CBC:  WBC 5.2, hemoglobin 12.8, hematocrit 38.9,  platelets 242,000, MCV 87.8.  Chemistry:  Sodium 137, potassium 3.7,  chloride 105, carbon dioxide 27, BUN 13, creatinine 1.4, random glucose  85.  His alcohol level was 70.  Urine drug screen positive for cocaine  and marijuana.   MENTAL STATUS EXAM:  Fully alert male.  Pleasant, cooperative.  Fully  coherent and alert, oriented x4, soft-spoken, appears to be depressed  and sad.  Speech soft in tone, minimal production, somewhat slowed in  pace.  Mood is depressed, feeling a lot of loss with the tragic death of  his nephew,  reflecting on his life a lot, feeling very sad about this.  Thought process logical and coherent.  No hallucinations.  No active  suicidal thoughts today.  Has had suicidal thoughts since the funeral of  his nephew in [place] about 5 days ago.  Cognition is fully preserved.  Insight is adequate.  Impulse control and judgment within normal limits.   AXIS I:  Major depression, recurrent, severe.  Cocaine abuse, rule out  dependence.  ETOH abuse, rule out  dependence.  AXIS II:  Deferred.  AXIS III:  HIV positivity.  AXIS IV:  Moderate financial stressors, severe grief and loss issues.  AXIS V:  Current 42, past year not known.   The plan is to voluntarily admit the patient for a safe detox in 5 days  and to alleviate his suicidal thought. We have started him on a Librium  protocol.  We will continue his routine medications at this time.  We  will check a CD-4 count at his request along with a TSH and his liver  enzymes.  He is in our dual diagnosis program, and he has voiced his  agreement with the plan.  Estimated length of stay is 5 days.      Margaret A. Lorin Picket, N.P.      Anselm Jungling, MD  Electronically Signed    MAS/MEDQ  D:  01/30/2007  T:  01/30/2007  Job:  [id number]

## 2010-05-24 NOTE — Discharge Summary (Signed)
[name], [name] NO.:  [id number]   MEDICAL RECORD NO.:  [id number]          PATIENT TYPE:  IPS   LOCATION:  0404                          FACILITY:  [facility]   PHYSICIAN:  Anselm Jungling, MD  DATE OF BIRTH:  1960/02/18   DATE OF ADMISSION:  11/05/2007  DATE OF DISCHARGE:  11/11/2007                               DISCHARGE SUMMARY   IDENTIFYING DATA/REASON FOR ADMISSION:  The patient is a 50 year old  single African American male, HIV positive, who has had many previous  admissions to our inpatient facility for depressive disorder.  He  returns at this time due to exacerbation of depression with suicidal  ideation.  Please refer to the admission note for further details  pertaining to the symptoms, circumstances and history that led to his  hospitalization.  He was given an initial Axis I diagnosis of major  depressive disorder, recurrent.   MEDICAL/LABORATORY:  The patient was medically and physically assessed  by the psychiatric nurse practitioner.  He was continued on his usual  HIV regimen of Kaletra and Truvada.  There were no acute medical issues  during this brief inpatient psychiatric stay.  Motrin was used on a  p.r.n. basis for mild pain.   HOSPITAL COURSE:  The patient was admitted to the adult inpatient  psychiatric service.  He presented as a well-nourished, normally-  developed adult male who did not display any signs or symptoms of  psychosis or thought disorder.  He was depressed with sad affect.  He  acknowledged that he had been drinking very heavily for several weeks in  response to a downturn in a relationship that was very disappointing to  him.  He was placed on an alcohol detoxification protocol.  Concomitant  with this, his Wellbutrin was held due to seizure risk.  His  detoxification proceeded uneventfully.  He participated in various  therapeutic groups and activities, and was a good participant as  treatment proceeded.  His mood  gradually improved, he showed brighter  affect, better participation and he was absent any suicidal ideation for  most of his inpatient stay.   On the ninth hospital day, he indicated that he felt ready for  discharge.  He agreed to the following aftercare plan.   AFTERCARE:  1. The patient was to follow up at [facility] with      an appointment on November 14, 2007.  2. The patient was also instructed to follow up with Dr. Philipp Deputy for      additional medication prescriptions.   DISCHARGE MEDICATIONS:  1. Motrin 800 mg as needed for pain q.8 h.  2. Truvada 1 tablet daily.  3. Kaletra 2 tablets twice daily.  4. Wellbutrin XL 300 mg daily.   DISCHARGE DIAGNOSES:  AXIS I:  Major depressive disorder, recurrent.  AXIS II:  Deferred.  AXIS III:  Human immunodeficiency virus.  AXIS IV:  Stressors severe.  AXIS V:  Global assessment of functioning on discharge 65.      Anselm Jungling, MD  Electronically Signed     SPB/MEDQ  D:  11/14/2007  T:  11/14/2007  Job:  [id number]

## 2010-05-24 NOTE — H&P (Signed)
[name], [name] NO.:  [id number]   MEDICAL RECORD NO.:  [id number]          PATIENT TYPE:  IPS   LOCATION:  0402                          FACILITY:  [facility]   PHYSICIAN:  Anselm Jungling, MD  DATE OF BIRTH:  1960/06/01   DATE OF ADMISSION:  08/28/2007  DATE OF DISCHARGE:                       PSYCHIATRIC ADMISSION ASSESSMENT   IDENTIFICATION:  A 50 year old African American male who is single.  This is a voluntary admission.   HISTORY OF PRESENT ILLNESS:  [facility] admission for [name] who  presented with suicidal thoughts in the emergency room with a plan to  overdose on his medications.  He had relapsed on alcohol and is now  drinking 24 beers most days of the week, also had relapsed on cocaine  which he was using several times a week.  He said that he continues to  mourn the death of his partner who died in 12/06/2006.  On  presentation, his alcohol level was 14 and his urine drug screen  positive for cocaine.  He denies any homicidal thoughts.   PAST PSYCHIATRIC HISTORY:  [facility] admission, last admission here March 20, 2007 to March 25, 2007, also for alcohol and cocaine abuse and  suicidal thoughts.  He does have a history of one prior suicide attempt  in 1996, and his longest period abstinent from cocaine and alcohol was  two years about seven or eight years ago.  He has a history of  persistent use of alcohol and cocaine.  He has no current active  outpatient mental health treatment arrangement.   SOCIAL HISTORY:  Single African American male.  He says he has some  supportive family in the area, previously living with a partner, and now  having financial stress.  Even though he is on disability, it does not  quite cover all of his expenses.  No legal problems.  He has endorsed a  history of adequate support network with his friends and he is aware of  HIV resources for persons positive for HIV in this area.   CURRENT MEDICAL HISTORY:   Followed by Yisroel Ramming, M.D., at  [facility].  Medical problems are AIDS.   MEDICATIONS:  1. Truvada one tablet daily 200/300 mg.  2. Kaletra 200/50 mg 2 tablets daily at 8 a.m. and 8 p.m.   He has also taken Seroquel in the past to deal with agitation and  previously on Wellbutrin.  Medication compliance is not clear.   POSITIVE PHYSICAL FINDINGS:  Physical exam was done in the emergency  room and is noted in the record.  This is a slim-built African American  male in no distress.  On admission to our unit, 6 feet tall, 158 pounds,  temperature 97.1, pulse 73, respirations 16, blood pressure 147/89.   LABORATORY DATA:  CBC:  WBC 6.0, hemoglobin 12.1, hematocrit 36.5,  platelets 269,000.  Chemistries:  Sodium 137, potassium 3.8, chloride  104, carbon dioxide 24, BUN 17, creatinine 1.30, random glucose 114.  Liver enzymes:  SGOT 66, SGPT 33, alkaline phosphatase 47, and total  bilirubin 0.9.  Albumin 3.9 and  calcium 8.8.  Urine drug screen positive  for cocaine.  Routine urinalysis is unremarkable.   MENTAL STATUS EXAM:  [name] is quite sleepy this morning.  He has been  cooperative with staff, oriented x4.  Speech soft in tone, minimal in  production.  Affect slightly irritable.  He rouses easily from sleep but  gives little by way of history or insight.  He is fully oriented.  Endorses depressed mood and a lot of hopelessness.  No evidence of  psychosis.  Mood is very depressed.  Cognitively he is oriented x4.  Endorsing suicidal thoughts.   DIAGNOSES:  AXIS I:  Depressive disorder NOS, polysubstance abuse.  AXIS II:  Deferred.  AXIS III:  AIDS and rule out transaminitis secondary to alcohol abuse.  AXIS IV:  Protracted grief over relationship loss.  AXIS V:  Current 46, past year not known.   PLAN:  The plan is to voluntarily admit him to alleviate his suicidal  thoughts and give him a safe detoxification from alcohol.  We have  placed him on Librium 25 mg every 6 hours  p.r.n. for withdrawal  symptoms.  He is in our dual diagnosis program.  Will continue his  antiretroviral therapy and at this point will hold his Wellbutrin until  we can evaluate him for a safe withdrawal from alcohol as he makes with  detoxification progress.      Margaret A. Lorin Picket, N.P.      Anselm Jungling, MD  Electronically Signed    MAS/MEDQ  D:  08/29/2007  T:  08/29/2007  Job:  [id number]
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
## 2010-05-24 NOTE — H&P (Signed)
[name], SUR              ACCOUNT NO.:  [id number]   MEDICAL RECORD NO.:  [id number]          PATIENT TYPE:  IPS   LOCATION:  0502                          FACILITY:  [facility]   PHYSICIAN:  Margaret A. Scott, N.P.DATE OF BIRTH:  04-22-1960   DATE OF ADMISSION:  07/29/2008  DATE OF DISCHARGE:                       PSYCHIATRIC ADMISSION ASSESSMENT   DATE OF ASSESSMENT:  July 30, 2008 at 8:40 a.m.   IDENTIFYING INFORMATION:  A 50 year old single male.  This is a  voluntary admission.   HISTORY OF PRESENT ILLNESS:  This is one of several [facility] admissions for  this 50 year old gentleman who is well-known to [place].  He relapsed 3 weeks  ago on cocaine, marijuana and alcohol, grieving the death of his nephew  who was age 29 and shot in [place].  His nephew was involved in the drug  trade.  The patient feels bad about his death.  Relapsed about 3 weeks  ago and has been for the last week snorting cocaine most every day,  smoking marijuana, drinking large amounts of alcohol for the past week  and drinking for a total of 3 weeks.  It was beginning to feel out of  control and had been acutely suicidal for about 5 days, thinking of  overdosing.  He does have a history of previous suicide attempts by  overdose and an attempt at hanging.  Denies any homicidal thought.  Asking for help getting back on his feet.  He has lapsed on taking his  Wellbutrin which has previously worked well for him for the depression,  asking for help getting clean and safely detoxed from alcohol.   PAST PSYCHIATRIC HISTORY:  Last [facility] admission in November of 2009.  Also  [facility] admissions in October, March and August of 2009.  He has a history  of major depressive episode.  Previous trials of Wellbutrin XL 300 mg  daily and Benadryl 100 mg p.o. q.h.s.  Previously followed at  [facility].   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   Basic education with some  college.  Stable relationship until November of 2008 when his partner of  several years died.   FAMILY HISTORY:  Denies a family history of mental illness or substance  abuse.   MEDICAL HISTORY:  Primary care practitioner Yisroel Ramming, M.D.   MEDICAL PROBLEMS:  1. HIV positivity.  2. Acute bronchitis NOS.   CURRENT MEDICATIONS:  Taking Kaletra and Truvada.  Wellbutrin XL 300 mg  daily which he has lapsed for a few days.  Given albuterol inhaler in  the emergency room, 500 mg of azithromycin and Tussionex for cough in  the emergency room.   DRUG ALLERGIES:  SULFA WHICH CAUSED A HIVE REACTION.   POSITIVE PHYSICAL FINDINGS:  Physical exam was done in the emergency  room.  A slight built African-American male who appears healthy, in no  acute distress.  Full physical examination noted in the record.  Alcohol  level 107.  CBC - WBC 5.9, hemoglobin 13.7, hematocrit 40.6 and  platelets 295,000.  Chemistries normal.  Urine drug screen positive for  marijuana and cocaine.   MENTAL STATUS EXAM:  Fully alert male, pleasant, cooperative.  Good eye  contact.  Affect appropriate.  Speech normal.  Gives a detailed history.  Mood depressed.  Thought process logical, coherent.  No evidence of  thought disorder.  Thinking is linear.  Asking for help getting back off  the alcohol safely, achieving abstinence.  Plans to follow up with  [facility].  Complaining of suicidal thoughts between  3 and 5 days a week this week.  Thinking of possibly overdosing.  No  homicidal thoughts.  Cognition is preserved.   IMPRESSION:  AXIS I:  Major depressive disorder NOS.  Alcohol abuse,  rule out dependence.  Cocaine dependence.  AXIS II:  No diagnosis.  AXIS III:  Human immunodeficiency virus positivity.  Acute bronchitis  NOS.  AXIS IV:  Severe issues with social isolation and significant medical  issues.  AXIS V:  Current 46.  Past year 66 estimated.    PLAN:  The plan is to voluntarily admit him to our dual diagnosis unit  with the plan of a safe detox in 4 days.  Will continue his treatment  for bronchitis.  Will start him on a Librium protocol and plan on  restarting his Wellbutrin XL 300 mg daily on the 24th after he makes  some progress with detox.  He has voiced his agreement with the plan.  We are going to check hepatic function panel.      Margaret A. Lorin Picket, N.P.     MAS/MEDQ  D:  07/30/2008  T:  07/30/2008  Job:  [id number]
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
## 2010-05-24 NOTE — Discharge Summary (Signed)
NAMECRIT, OBREMSKI              ACCOUNT NO.:  [id number]   MEDICAL RECORD NO.:  [id number]          PATIENT TYPE:  INP   LOCATION:  5529                         FACILITY:  [facility]   PHYSICIAN:  Wayne A. Sheffield Slider, M.D.    DATE OF BIRTH:  1961/01/01   DATE OF ADMISSION:  03/15/2007  DATE OF DISCHARGE:  03/20/2007                               DISCHARGE SUMMARY   DISCHARGE MEDICATIONS:  1. Seroquel 50 mg p.o. q.h.s.  2. Kaletra (200/50) 2 tabs p.o. q 8 a.m. and q 8 p.m.  3. Truvada (200/300) 1 tab p.o. daily.  4. Multivitamin 1 tab p.o. daily.  5. Acetaminophen 650 mg p.o. q 6 hours p.r.n. pain and fever.  6. Ibuprofen 400 mg p.o. 6 hours p.r.n. pain and fever.   CONSULTS:  None.   PROCEDURES:  1. Chest x-ray on March 6 showed mild chronic bronchiectatic changes.  2. A CT of the chest with contrast also on March 6 showed no active      lung disease and mild nonspecific mediastinal and hilar adenopathy.   DISCHARGE DIAGNOSES:  1. AIDS.  2. Depression with suicidal ideation.  3. Pneumonia.  4. Polysubstance abuse.  5. Alcohol abuse.  6. Tobacco abuse.   LABS:  On admission the patient had a CBC with a white blood count of  4.5, hemoglobin 13.7, hematocrit 41.2, and platelets 277. His alcohol  level on admission was 133, and his drug screen was positive for  cocaine, opiate, and marijuana. Urinalysis at that time was within  normal limits. Rapid strep screen was negative. Influenza A and B virus  antigens were negative. His admission PT and INR were 12.6 and 0.9 with  a PTT of 31. Blood cultures were no growth. CD4 count 200. On March 7th,  his AST was elevated at 110 with an ALT of 86. On March 11, his CBC  showed a white blood count of 4.3 with a hemoglobin of 13.3, hematocrit  39.7, platelets 229. A comprehensive metabolic panel was significant for  a glucose of 115, an AST of 39, and an ALT of 33 with an albumin of 3.3,  otherwise within normal limits.   BRIEF HOSPITAL  COURSE:  This is a 50 year old male with AIDS and  depression with suicidal ideation and pneumonia.  1. AIDS. The patient was maintained on his antiretroviral treatment of      Kaletra 2 tabs twice daily and Truvada 1 tab daily. A CD4 count      came back at 200 which is down from his previous. Of note, he was      likely not taking his antiretrovirals prior to admission so would      expect this to improve with regular use. He should follow up with      his infectious disease doctor to determine whether new therapy is      needed or if his CD4 count continues to drop whether he will need      prophylaxis against PCP.  2. Pneumonia. The patient had a febrile illness when he came in, and  he was treated empirically with azithromycin for atypical      pneumonia; however, viral ideology was also likely. Sputum cultures      were attempted to be obtained, but were not obtained until the day      of discharge.  3. Transaminitis. The patient was admitted with transaminitis which      was likely thought to be due to alcohol. These were followed and      were normal on discharge.  4. Alcohol abuse. The patient was started on Ativan protocol. No signs      and symptoms of withdrawal noted throughout the hospitalization.  5. Depression with suicidal ideation. Of note the patient is not on      any antidepressants at this time. He is on Seroquel. The patient      voiced suicidal ideation every day throughout his admission, and a      sitter was kept at his bedside throughout. Arrangements were made      for his transfer to behavioral health for inpatient admission once      medically stable. He is now medically stable, and we will arrange      transfer.  6. Diarrhea. The patient had diarrhea throughout. Stool studies were      ordered and were still pending on discharge.  7. Polysubstance abuse. The patient's urine drug screen was positive      for multiple drugs based on admission. It should  be followed up as      part of his treatment at behavioral health.   FOLLOWUP:  Transfer is being arranged to behavioral health for suicidal  ideation. The patient is discharged to [facility] in  stable medical condition.      Romero Belling, MD  Electronically Signed      Arnette Norris. Sheffield Slider, M.D.  Electronically Signed    MO/MEDQ  D:  03/20/2007  T:  03/20/2007  Job:  [id number]

## 2010-05-24 NOTE — H&P (Signed)
NAMEBARTLEY, VUOLO NO.:  [id number]   MEDICAL RECORD NO.:  [id number]          PATIENT TYPE:  IPS   LOCATION:  0403                          FACILITY:  [facility]   PHYSICIAN:  Anselm Jungling, MD  DATE OF BIRTH:  17-Aug-1960   DATE OF ADMISSION:  03/20/2007  DATE OF DISCHARGE:                       PSYCHIATRIC ADMISSION ASSESSMENT   TIME OF ASSESSMENT:  11:30 a.m.   IDENTIFYING INFORMATION:  A 50 year old [place] American male.  This is  a voluntary admission.  He is single.   HISTORY OF PRESENT ILLNESS:  This is one of several admissions to [facility]  for this 50 year old who developed suicidal thoughts after relapsing on  alcohol, cocaine, marijuana and opiates.  He has a long history of  polysubstance abuse and last previous admission was in January to [facility],  also for polysubstance abuse and depression.  He has been struggling  with some depression since his partner of several years died suddenly of  a heart attack in November 2008.  While in the emergency room he spiked  a fever and was admitted to the medical unit from March 6 to March 20, 2007, for treatment of pneumonia.   PAST PSYCHIATRIC HISTORY:  He has a history of polysubstance abuse and  depression with at least two prior suicide attempts, one by overdose in  the past.  Last suicide attempt in 1996; has a history of polysubstance  abuse with longest period of complete abstinence has been up to 2 years  at a time, about 7 years ago.   SOCIAL HISTORY:  A single African American male disabled due to multiple  medical problems, has supportive family in the area, now recently has  had some additional financial stressors having lost his partner in  November of last year.  Has reported having a fairly good social support  system among his friends.  No known current legal problems.   PAST MEDICAL HISTORY:  The patient is followed by Dr. Yisroel Ramming at  [facility].  Medical problems  are pneumonia  currently under treatment, currently resolving, and HIV/AIDS.   CURRENT MEDICATIONS:  1. Are Seroquel 50 mg p.o. daily at bedtime.  2. Kaletra 200/50 2 tablets p.o. q. a.m. and 8 p.m.  3. Truvada 200/300 mg 1 tablet p.o. daily.  4. Multivitamin 1 tablet daily.  5. Acetaminophen 650 mg p.o. q.6 h. p.r.n. for pain or fever.  6. Ibuprofen 400 mg p.o. q.6 h. p.r.n. for pain or fever.  7. The patient has also taken Wellbutrin in the past but is not      currently taking it.   DRUG ALLERGIES:  SULFA drugs.   Physical examination was done in the medical unit, please refer to the  discharge summary by Dr. Zachery Dauer at the [facility].   DIAGNOSTIC STUDIES:  CBC revealed WBC 4.5, hemoglobin 13.7, hematocrit  41.2 and platelets 277,000.  Alcohol level on admission was 133.  Urine  drug screen was positive for cocaine, opiates and marijuana.  Urinalysis  was normal.  Rapid Strep was negative.  Influenza A and  B virus antigens  were negative.  Blood cultures revealed no growth.  CD-4 count was 200  compared to 560 in January of this year.  Liver enzymes were mildly  elevated with an SGOT of 110 and ALT of 86.  Metabolic panel was  significant for a glucose of 115 and AST of 39, ALT of 33 at discharge  and an albumin of 3.3.   MENTAL STATUS EXAM:  Fully alert, oriented x4, rather irritable and  dysphoric but has been somewhat reclusive but is making an effort to go  to groups.  Appetite is beginning to return.  No hallucinations.  No  evidence of psychosis.  No active suicidal thoughts today.  Insight is  adequate.  Impulse control and judgment within normal limits.   AXIS I:  Major depression recurrent, severe; EtOH abuse, rule out  dependence, cocaine abuse.  AXIS II:  Deferred.  AXIS III:  HIV, pneumonia.  AXIS IV:  Severe stressors with grief and relationship loss.  AXIS V:  Current 46 and past year 71.   PLAN:  Is to voluntarily admit the patient to  stabilize thinking and  alleviate his suicidal thoughts.  He is enrolled in our dual diagnosis  program and has been participating in groups, appropriate with peers and  staff, will consider restarting his Wellbutrin and estimated length of  stay is 5 days.      Margaret A. Lorin Picket, N.P.      Anselm Jungling, MD  Electronically Signed    MAS/MEDQ  D:  03/22/2007  T:  03/23/2007  Job:  [id number]

## 2010-05-24 NOTE — H&P (Signed)
NAMEREILEY, [name] NO.:  [id number]   MEDICAL RECORD NO.:  [id number]          PATIENT TYPE:  IPS   LOCATION:  0304                          FACILITY:  [facility]   PHYSICIAN:  Jasmine Pang, M.D. DATE OF BIRTH:  10-Jul-1960   DATE OF ADMISSION:  11/23/2007  DATE OF DISCHARGE:                       PSYCHIATRIC ADMISSION ASSESSMENT   This is a voluntary admission to the services of Dr. Milford Cage.   IDENTIFYING INFORMATION:  This is a 50 year old African American male  who just lost his partner to HIV/AIDS.  He presented to the emergency  department at [facility] on November 14 complaining that he had been  suicidal for the past week.  He says he buried his partner yesterday.  He died from HIV.  He reported a planned overdose on pills and requested  that he be readmitted here to [facility].   PAST PSYCHIATRIC HISTORY:  [name] was most recently with [place] October  27 to November 2.   SOCIAL HISTORY:  He gets disability and he just lost his partner.   FAMILY HISTORY:  None.   ALCOHOL AND DRUG HISTORY:  He smokes.  Occasionally he uses alcohol.  He  has no history for withdrawal seizures, DTs or blackouts and he has  snorted cocaine in the past.   PRIMARY CARE PHYSICIAN:  Dr. Philipp Deputy, telephone number is [phone].   MEDICAL PROBLEMS:  HIV.   MEDICATIONS:  He is currently on Kaletra and Truvada.  He has taken  Wellbutrin in the past.  He has been off of those medications recently  but was restarted.  At the time of his discharge on November 2 he was  on:  1. Motrin 800 mg as needed p.o. p.r.n. pain every 8 hours.  2. Truvada 1 tablet daily.  3. Kaletra 2 tablets b.i.d.  4. Wellbutrin XL 300 mg p.o. q. a.m.   DRUG ALLERGIES:  SULFA DRUGS.   POSITIVE PHYSICAL FINDINGS:  He was medically cleared in the ED at [facility]  He had an alcohol level of 5.  His other labs were unremarkable.  His vital signs on admission to the unit show he is 72 inches tall,  weighs  165.  Temperature is 97.2, blood pressure is 128/94 to 130/90,  pulse is 70-84, respirations are 18.   MENTAL STATUS EXAM:  He is alert and oriented.  He is appropriately  groomed, dressed and nourished.  His speech is soft and slow.  His mood  is depressed.  His affect is congruent with grieving the loss of his  partner.  His thought processes were clear, rational and goal oriented.  He states he just needs a few days to get his wits about him.  Judgment  and insight are good.  Concentration and memory are intact.  Intelligence is at least average.   DIAGNOSES:  AXIS I:  Grief reaction, major depressive disorder recurrent  severe without psychotic features.  AXIS II:  Deferred.  AXIS III:  HIV positive.  AXIS IV:  Primary support and grief.  AXIS V:  30.   PLAN:  To admit for safety  and stabilization.  The patient has requested  that he be transferred to care by Dr. Electa Sniff and assuming we can move  his bed over there we will.  He had plans to go to [facility] with other  family for Thanksgiving and we will revisit those plans in a day or two  once he feels less acutely depressed over his partner's death.  No  medication changes today.  Estimated length of stay is 3-5 days.      Mickie Leonarda Salon, P.A.-C.      Jasmine Pang, M.D.  Electronically Signed    MD/MEDQ  D:  11/24/2007  T:  11/24/2007  Job:  [id number]

## 2010-05-27 NOTE — Discharge Summary (Signed)
NAMEKOL, CONSUEGRA NO.:  [id number]   MEDICAL RECORD NO.:  [id number]          PATIENT TYPE:  IPS   LOCATION:  0406                          FACILITY:  [facility]   PHYSICIAN:  Anselm Jungling, MD  DATE OF BIRTH:  Feb 19, 1960   DATE OF ADMISSION:  11/23/2007  DATE OF DISCHARGE:  12/02/2007                               DISCHARGE SUMMARY   IDENTIFYING DATA AND REASON FOR ADMISSION:  The patient is a 50 year old  African American male, single, HIV positive, and this was one of many  inpatient psychiatric admissions for him.  He returned due to depression  that was triggered by the death of a close friend of AIDS.  Please refer  to the admission note for further details pertaining to the symptoms,  circumstances and history that led to his hospitalization.  He was given  an initial Axis I diagnosis of adjustment disorder with depressed mood,  alcohol abuse, last dependence, and bereavement.   MEDICAL AND LABORATORY:  The patient was medically and physically  assessed by the psychiatric nurse practitioner.  He was continued on his  usual HIV medications.  There were no significant medical issues during  this brief stay.   HOSPITAL COURSE:  The patient was admitted to the adult inpatient  psychiatric service.  He presented as a slender, but adequately  nourished and normally-developed African American male who was pleasant  and cooperative.  He was a good historian.  His mood was depressed, with  sad affect.  He denied any suicidal ideation, but indicated a degree of  hopelessness and feeling overwhelmed, and consumed with grief over the  death of his friend.  He verbalized a strong desire for help.  He also  admitted to having relapsed on alcohol and having been drinking heavily.   He was placed on a Librium protocol for alcohol withdrawal, and while  this was in progress, his usual Wellbutrin dose was held, due to  increased seizure risk.  Benadryl was utilized at  bedtime for sleep.  He  participated in various therapeutic groups and activities and was a good  participant.  His detoxification proceeded uneventfully.  He continued  to grieve appropriately.   The patient indicated that he felt much better and ready for discharge  on the tenth hospital day.  He agreed to the following aftercare plan.   AFTERCARE:  The patient was to follow up with the [facility] for  substance abuse/alcohol treatment, and also had a followup appointment  with PHP on December 03, 2007 a 2 p.m.   DISCHARGE MEDICATIONS:  1. Kaletra 2 tablets twice daily.  2. Truvada 1 tablet daily.  3. Wellbutrin XL 300 mg daily.  4. Benadryl 100 mg nightly.   DISCHARGE DIAGNOSES:  AXIS I:  1. Bereavement.  2. Major depressive disorder, recurrent.  3. Alcohol dependence, early remission.  AXIS II:  Deferred.  AXIS III:  Human immunodeficiency virus positive.  AXIS IV:  Stressors severe.  AXIS V:  GAF on discharge 55.      Anselm Jungling, MD  Electronically Signed  SPB/MEDQ  D:  12/12/2007  T:  12/12/2007  Job:  [id number]

## 2010-05-27 NOTE — H&P (Signed)
[name], [name] NO.:  [id number]   MEDICAL RECORD NO.:  [id number]                   PATIENT TYPE:  IPS   LOCATION:  0405                                 FACILITY:  [facility]   PHYSICIAN:  Geoffery Lyons, M.D.                   DATE OF BIRTH:  [birth year]   DATE OF ADMISSION:  06/18/2002  DATE OF DISCHARGE:                         PSYCHIATRIC ADMISSION ASSESSMENT   IDENTIFYING INFORMATION:  This is a 50 year old African-American male who is  single.  This is a voluntary admission.   CHIEF COMPLAINT:  I've given up.   HISTORY OF PRESENT ILLNESS:  This is the third admission to [facility] for this African-American male with a history of  alcohol and cocaine abuse.  He was brought into the emergency room by a  friend after he threatened to overdose on heroin.  He subsequently flushed  the heroin down the toilet and had tried to hang himself on the previous day  but apparently, according to the record, the belt had broken that he was  trying to use.  He is reclusive and suffering from symptoms of nausea this  morning and gives a limited interview.  He endorses hopelessness,  helplessness and has also been experiencing some auditory hallucinations  hearing his name called and it is unclear if they include commands.  He  endorses suicidal thoughts, feeling that he has no reason to go on living  any further.  He has been using cocaine intermittently with his last year  use being on Saturday, June 14, 2002 and he has also been using some alcohol  about four days a week with his last use on Sunday, June 15, 2002.  He denies  any homicidal ideation or paranoia.   PAST PSYCHIATRIC HISTORY:  This is the third [facility] admission for this patient with a history of AIDS and substance abuse  and depression.  He was last here in 19-Apr-2002 following the death of  his partner.  At that time, he was previously  discharged stable on Seroquel.  He has a history of two prior suicide attempts.   SOCIAL HISTORY:  This is a single African-American male who currently lives  alone.  It is unclear if he actually has a regular stable address.  At  times, he is apparently living on the street.  He is followed by case  Production designer, theatre/television/film at [employer].  He denies any current legal charges.  He  reports his greatest stressor is his HIV infection and his prophylactic  treatment, having to go the health department daily for TB prophylaxis.   FAMILY HISTORY:  Not clear.   ALCOHOL/DRUG HISTORY:  The patient has a history of cocaine and alcohol  abuse.  His urine was positive for cocaine in the emergency room.  Alcohol  level was less than 5.  Current usage noted above.  Longest period clean and  sober is unclear.   PAST MEDICAL HISTORY:  The patient is followed by Dr. Petra Kuba through the  [facility] for his prophylaxis for his tuberculosis  and his pulmonary status.  He is followed by Dr. Yisroel Ramming at [facility] for his HIV problems.  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  Past medical history is remarkable for  a right upper lobe infiltrate that developed around March.  We have spoken  with the Health Department about this today and they note that the patient  does have a negative TB skin test and has had negative serial sputum  cultures.  He is on mandatory TB prophylaxis because of the persistent  infiltrate in his status with active AIDS.  He has no evidence of active TB  at this point and they recommend no isolation or any other particular  treatment or care precautions other than his mandatory prophylaxis.   MEDICATIONS:  INH 300 mg p.o. q.d., pyrazinamide 1500 mg daily, pyridoxine  50 mg daily, ethambutol 1200 mg daily.  His antiviral medications are  Kaletra 133.3\33.3, 3 caps p.o. b.i.d., Viread 300 mg p.o. q.d. and   patient to be given dose today, Epivir 300 mg p.o. q.d., Zithromax 500 mg  p.o. q.d. and Mepron suspension 750 mg in 5 ml q.a.m.  The patient is on  Zithromax prophylactically for PCP.   ALLERGIES:  SULFA, CLINDAMYCIN and BACTRIM.   POSITIVE PHYSICAL FINDINGS:  GENERAL:  The patient's physical examination  was done at the [facility] and notes a thin, almost  cachectic, African-American male who is tall, weighed 143 pounds.  He is  approximately 6 feet tall.  VITAL SIGNS:  Vital signs are temperature 97.3, pulse 66, respirations 20,  blood pressure 110/69.  He had some mild nausea.  NEUROLOGIC:  Motor movements are smooth.  Physical examination is otherwise  generally unremarkable with a normal motor exam.   REVIEW OF SYSTEMS:  Today is remarkable for some mild nausea and feeling  somewhat tremulous and nauseous in his stomach.  He has not actually vomited  however.  No evidence of cough and patient denies any regular or productive  cough.   LABORATORY DATA:  CBC is remarkable of WBC of 2100.  His hemoglobin 13.3,  hematocrit 40.4, platelets 160,000.  Metabolic panel is remarkable for  normal electrolytes.  His SGOT is at 43, SGPT 27.  Urine drug screen  remarkable for cocaine.  No other substances.  Alcohol level was less than  5.   MENTAL STATUS EXAM:  This is a fully alert male who is having mild nausea.  He has blunted affect.  He is lying in the bed and opens his eyes for a  moment but, for the rest of the time, turns his face to me and speaks to me  with his eyes closed.  He says very little and is passively cooperative.  He  does show some psychomotor slowing.  Otherwise a normal motor exam.  Speech  is soft with low tone.  Does have some alogia.  Mood is depressed, hopeless  and helpless.  Some very mild irritability.  Thought processes are  remarkable for some thought paucity.  Positive suicidal ideation without any clear plan except possibly overdosing  on his medications.  No clear  homicidal ideation.  No visual hallucinations but patient is positive for  auditory  hallucinations, hearing his name occasionally and some few commands  intermittently to harm himself.  Cognitively, he is intact and oriented x 3.  Intelligence is above average.  Insight fair.  Impulse and judgment guarded.   DIAGNOSES:   AXIS I:  1. Major depressive disorder, recurrent, severe.  2. Cocaine abuse; rule out dependence.   AXIS II:  No diagnosis.   AXIS III:  AIDS.   AXIS IV:  Severe (grief due to loss of his mother, who was deceased in May 16, 2002 and patient just recently learned of this and grief over the loss of  his partner who is deceased in 04-15-02.  The patient also may have a  housing problem which is unclear.  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   AXIS V:  Current 29; past year 48, estimated.   PLAN:  Voluntarily admit the patient with 15-minute checks in place with our  goal of alleviating his suicidal ideation.  We are going to start him on  Librium 25 mg p.o. q.6h. p.r.n. for agitation or withdrawal symptoms.  We  are going to give him the dose now since it is not quite clear.  He could be  having some alcohol withdrawal given his mild nausea.  Meanwhile, we will  also start him on Lexapro 5 mg p.o. daily for his depression.  We have also  started him on Zyprexa 5 mg p.o. q.h.s. and q.6h. p.r.n. for hallucinations.  The Zyprexa has so far been successful in alleviating his hallucinations.  Meanwhile, we will restart his antiviral medication as he has been  previously taking them and we are going to restart his TB prophylaxis.   TENTATIVE LENGTH OF STAY:  Seven to 10 days.     Margaret A. Scott, N.P.                   Geoffery Lyons, M.D.    MAS/MEDQ  D:  06/18/2002  T:  06/18/2002  Job:  [id number]

## 2010-05-27 NOTE — Discharge Summary (Signed)
[name], [name] NO.:  [id number]   MEDICAL RECORD NO.:  [id number]                   PATIENT TYPE:  IPS   LOCATION:  0405                                 FACILITY:  [facility]   PHYSICIAN:  Geoffery Lyons, M.D.                   DATE OF BIRTH:  1960-11-05   DATE OF ADMISSION:  06/18/2002  DATE OF DISCHARGE:  06/26/2002                                 DISCHARGE SUMMARY   CHIEF COMPLAINT AND PRESENTING ILLNESS:  This was the third admission to  [facility]  for this 50 year old African-American male,  single.  Claimed that he had given up.  History of alcohol and cocaine  abuse.  He threatened to overdose on heroin, flushed the heroin down the  toilet and tried to hang himself, but the belt had broken down.  Reclusive,  suffering from symptoms of nausea.  Endorsed hopelessness, helplessness,  some auditory hallucinations hearing his name called.  No reason why to go  on.  He has been using cocaine intermittently and some alcohol.   PAST PSYCHIATRIC HISTORY:  Third time [facility],  history of substance abuse and depression.   ALCOHOL AND DRUG HISTORY:  As already stated, ongoing use of alcohol and  cocaine, and also some use of heroin.   PAST MEDICAL HISTORY:  HIV positive, AIDS, right upper lobe infiltrate.   MEDICATIONS:  TB prophylaxis medication Isoniazid 300 daily, pyrazinamide  1500 mg daily, pyridoxine 50 mg daily, ethambutol  1200 mg daily, Kaletra  133.3/33.3 three tabs twice a day, Viread 300 mg every day, Epivir 300 every  day, Zithromax  500 every day prophylactically, Mepron suspension 750/5 mL.   PHYSICAL EXAMINATION:  Performed, showed a thin, almost cachectic African-  American male, otherwise no acute findings.   MENTAL STATUS EXAM:  Reveals a fully alert male, having mild nausea, blunted  affect, lying in bed, opening his eyes for a moment.  Speaks with his eyes  closed, passively  cooperative.  Some psychomotor slowing.  Speech is soft,  with low tone, some alogia.  Mood is depressed, hopeless, helpless,  irritable.  Some thought paucity, reporting suicidal ideas, no clear plan.  No visual hallucinations, no auditory hallucinations.  Cognition well  preserved.   ADMISSION DIAGNOSES:   AXIS I:  1. Cocaine and alcohol dependence.  2. Major depression, recurrent, with psychotic features.   AXIS II:  No diagnosis.   AXIS III:  AIDS.   AXIS IV:  Severe.   AXIS V:  Global assessment of function upon admission 25, highest global  assessment of function in past year 65.   LABORATORY DATA:  CBC:  White blood cells 2.5, hemoglobin 12.2.  Blood  chemistries were within normal limits.  On lab work, he had a high viral  count and a low T4.   COURSE IN HOSPITAL:  He was admitted  and started on intensive individual and  group psychotherapy.  He was maintained on his medications.  He was placed  on Zyprexa Zydis 5 at night, Ambien 10 at bedtime for sleep.  He was given  some Ativan as needed for anxiety or withdrawal.  He was switched to Librium  25 every 6 hours as needed for withdrawal.  He was given Phenergan, Lexapro  5 mg daily, was kept on his HIV medications and his TB prophylaxis.  He was  hydrated with Gatorade.  Zyprexa was increased to 10 at bedtime and Lexapro  was increased to 10.  Eventually, Lexapro was increased to 20 daily.  Initially very guarded, very resistant, very irritable, feeling sick,  hopeless, helpless, a lot of distress, dysphoric.  But then as  hospitalization progressed, he started turning around, started sleeping  better, was more alert, up and about, started going to groups.  Still  hopeless and helpless because of his situation.  He was going to have a  appointment at the infectious disease clinic.  He was pushing himself to be  better by the time of the appointment.  On June 17, he was much improved, in  full contact with reality.   His affect was brighter, fuller, more positive  and more motivated, willing to pursue follow up and abstain from substances.  There were no suicidal or homicidal ideas, no delusions, so he was  discharged to outpatient follow-up.   DISCHARGE DIAGNOSES:   AXIS I:  1. Major depression, recurrent, with psychotic features.  2. Cocaine and alcohol dependence.   AXIS II:  No diagnosis.   AXIS III:  AIDS.   AXIS IV:  Moderate.   AXIS V:  Global assessment of function upon discharge 50.   DISCHARGE MEDICATIONS:  1. Nembutal 400 3 daily.  2. Vitamin B6 15 mg daily.  3. Isoniazid 300 mg 1 daily.  4. Pyrazinamide 500  three daily.  5. Kaletra 133/33.3 three tabs twice a day.  6. Viread 300 mg 1 daily.  7. Epivir 150 2 daily.  8. Mepron 750/5 mL.  9. Zithromax 600 mg daily.  10.      Zyprexa Zydis 10 at bedtime.  11.     Lexapro 20 daily.  12.      Ambien 10 for sleep.   DISPOSITION:  Follow up at [facility], Dr. Philipp Deputy.                                               Geoffery Lyons, M.D.    IL/MEDQ  D:  07/16/2002  T:  07/17/2002  Job:  [id number]

## 2010-05-27 NOTE — Discharge Summary (Signed)
NAMEKYLEY, LAUREL NO.:  [id number]   MEDICAL RECORD NO.:  [id number]                   PATIENT TYPE:  IPS   LOCATION:  0307                                 FACILITY:  [facility]   PHYSICIAN:  Jeanice Lim, M.D.              DATE OF BIRTH:  April 09, 1960   DATE OF ADMISSION:  02/18/2003  DATE OF DISCHARGE:  02/24/2003                                 DISCHARGE SUMMARY   IDENTIFYING DATA:  A 50 year old single African-American male, voluntarily  admitted, presenting with a history of depression, tried to hang himself  with a belt as per patient.  When failed, went to neighbor's house who took  him to the emergency room.  The patient had 6-10 beers daily.  Mother died  Apr 22, 2024grandmother died December 31, 2022.  Grandmother was best friend and life  support.  The patient also has medical problems.   ADMISSION MEDICATIONS:  Antivirals Epivir, Viread, Zithromax, Mepron.   ALLERGIES:  SULFA, BACTRIM, CLINDAMYCIN, DAPSONE.   PHYSICAL EXAMINATION:  Essentially within normal limits, neurologically  nonfocal.   ROUTINE ADMISSION LABS:  Within normal limits.   MENTAL STATUS EXAM:  Awake, tired, weak appearing male, poor eye contact.  Speech clear, mood tired, depressed.  Affect flat, thought process goal  directed, no hallucinations, cognitively intact.  Judgment and insight fair.   ADMISSION DIAGNOSES:   AXIS I:  1. Major depressive disorder, severe, recurrent.  2. Alcohol abuse.  3. Cocaine abuse.   AXIS II:  Deferred.   AXIS III:  AIDS.   AXIS IV:  Moderate, problems with primary support group and other  psychosocial issues.   AXIS V:  30/60.   HOSPITAL COURSE:  The patient was admitted and ordered routine p.r.n.  medications, underwent further monitoring, and was encouraged to participate  in individual, group and milieu therapy.  The patient was monitored for  safety, given supportive treatment.  The patient initially was isolative,  not  participating in treatment, feeling sick, nauseated, having difficulty  keeping down food, feeling weak and tired as he has in the past when  presenting following cocaine use.  The patient complained of chills, nausea,  vomiting, diarrhea, was unable to get out of bed.  Had diarrhea for 2-4  weeks.  Has a history of  PCP two times in the past and was treated for TB  despite cultures turning out negative.  The patient complained of productive  cough and was quite depressed, with psychomotor retardation and suicidal  ideation, feeling hopeless.  The patient gradually as he received supportive  intervention reported physically feeling better and began participating in  treatment and reported a positive response to clinical interventions and  medication changes.  The patient tolerated optimizing Zoloft and Seroquel,  has a history of responding to Zoloft in the past, reported a slight  increase in insight regarding the impact of substance use on his medical  condition and his mood, specifically.  The patient reported more hope,  improved frustration tolerance.  His affect was brighter and condition  improved at the time of discharge.  There was no dangerous ideation or  psychotic symptoms and the patient reported motivation to be compliant with  the aftercare plan, including remaining abstinent and had a relapse  prevention plan developed.  The patient was given medication education.   DISCHARGE MEDICATIONS:  1. Seroquel 100 mg 1.5 to 2 at 8:30.  2. Ambien 10 mg q.h.s.  3. Zoloft 50 mg q.a.m.  4. Nizoral shampoo applied to scalp each day.  5. Kaletra 3 caps in the morning.  6. Albuterol 2 puffs q.6 p.r.n.  7. Viread 300 mg q.a.m.  8. Mepron 750 mg per 5 mL per day.  9. Zithromax 500 mg 2 tabs once every 7 days.  10.      Epivir 300 mg q.a.m.   DISPOSITION:  The patient was to follow up at [facility],  Monday, February 28 at 11 a.m.   DISCHARGE DIAGNOSES:   AXIS I:   1. Major depressive disorder, severe, recurrent.  2. Alcohol abuse.  3. Cocaine abuse.   AXIS II:  Deferred.   AXIS III:  AIDS.   AXIS IV:  Moderate, problems with primary support group and other  psychosocial issues.   AXIS V:  Global assessment of function on discharge was 50-55.                                               Jeanice Lim, M.D.    JEM/MEDQ  D:  03/28/2003  T:  03/29/2003  Job:  [id number]

## 2010-05-27 NOTE — H&P (Signed)
[name], [name] NO.:  [id number]   MEDICAL RECORD NO.:  [id number]          PATIENT TYPE:  IPS   LOCATION:  0406                          FACILITY:  [facility]   PHYSICIAN:  Geoffery Lyons, M.D.      DATE OF BIRTH:  30-Jun-1960   DATE OF ADMISSION:  05/28/2004  DATE OF DISCHARGE:                         PSYCHIATRIC ADMISSION ASSESSMENT   Apparently the patient presented last evening to the emergency room at  [facility] complaining of suicidal ideation.  He states that he has had  suicidal ideation since Mother's Day.  He is hearing auditory hallucinations  of his deceased mother's voice.  He has a plan to overdose on Seroquel.  He  denies any follow up since his last admission and he had a plan to overdose  on Seroquel.  This is one of numerous admissions to our facility as well as  other facilities.  The patient acknowledges that he could use a case manager  and also is requesting a payee for his SSI check.  I will put in a request  for that with our case manager.   PAST PSYCHIATRIC HISTORY:  Numerous admissions here, [facility] and other  facilities.   SOCIAL HISTORY:  Please his prior records.   FAMILY HISTORY:  He states his father was an alcoholic.  Sister has  schizophrenia.   ALCOHOL AND DRUG HISTORY:  His urine was positive for cocaine.  His alcohol  on this admission was less than 5 although he had much higher alcohol levels  in the past.   PRIMARY CARE Khaleem Burchill:  [facility], Dr. Philipp Deputy.   MEDICAL PROBLEMS:  He does have HIV for which he is treated.   LABORATORY DATA:  His lab work is pending.  I do not have a recent CD-4 cell  count.   MENTAL STATUS EXAM:  He is a little bit sleepy but he is arousable.  He is  oriented x3.  He has appropriate grooming, dressing and he appears to be  adequately nourished.  His speech is not pressured.  His mood is somewhat  irritable.  His affect is congruent.  His thought processes are clear,  rationale and  goal oriented.  He agrees to the fact that he needs a case  Production designer, theatre/television/film.  Judgment and insight are fair.  Concentration and memory are  intact.  At the moment he is denying or suicidal or homicidal ideation.  He  did get into a frackis with another patient last night requiring sedation.  Intelligence is at least average.  He reports experiencing auditory  hallucinations although he does not appear to be responding to internal  stimuli.   DIAGNOSES:  Axis I.  Cocaine dependence, alcohol dependence, major  depressive disorder recurrent, severe with psychotic features secondary to  polysubstance abuse.  Axis II.  Deferred.  Axis III.  Acquired immunodeficiency syndrome with a history for a low CD-4  count.  Axis IV.  Moderate to severe.  Axis V.  30.   PLAN:  Support through withdrawal of substances.  Re-establish compliance  with currently prescribed medication.  Identify a case Production designer, theatre/television/film  for him.  We  will restart his medications as per his last admission in March.  Seroquel  100  to 200 mg at h.s., Wellbutrin XL 300 mg q.a.m., Cymbalta 30 mg a.m. and h.s.  Currently we restarted the meds he brought in with him.  Symmetrel 100 mg  b.i.d., Protonix 40 mg q.a.m., Epivir 150 mg two in the a.m., Viread 300 mg  q.a.m., Kaletra 200/50 two b.i.d. and ibuprofen 800 mg q.8h. p.r.n. shoulder  pain.      MD/MEDQ  D:  05/29/2004  T:  05/29/2004  Job:  [id number]

## 2010-05-27 NOTE — Discharge Summary (Signed)
[name], [name]                          ACCOUNT NO.:  [id number]   MEDICAL RECORD NO.:  [id number]                   PATIENT TYPE:  IPS   LOCATION:  0500                                 FACILITY:  BH   PHYSICIAN:  Jeanice Lim, M.D.              DATE OF BIRTH:  13-Oct-1960   DATE OF ADMISSION:  07/18/2002  DATE OF DISCHARGE:  07/25/2002                                 DISCHARGE SUMMARY   IDENTIFYING DATA:  This is a voluntary admission.  The neighbor found the  patient sitting on the floor, crying, after the patient had attempted to  hang himself.  He acknowledged being depressed.  Partner of 13 years died in  April 14, 2022 of AIDS.  The patient was very distressed and supposed to be on TB  prophylaxis at the time of admission but very low CD4 count.  This was the  fourth admission to [facility].   MEDICATIONS:  1. INH.  2. Pyrazinamide.  3. Pyridoxine.  4. Ethambutol.  5. Nystatin suspension.  6. Antiviral.  7. Kaletra.  8. Mepron.  9. Viread.  10.      Epivir.  11.      Zithromax.   DRUG ALLERGIES:  SULFA DRUGS.   PHYSICAL EXAMINATION:  GENERAL:  Essentially within normal limits.  NEUROLOGIC:  Nonfocal.   LABORATORY DATA:  Routine admission labs: Within normal limits except white  count 2.8, slightly low.   MENTAL STATUS EXAM:  Alert and oriented.  Appearance: Calm, somewhat  unkempt.  Speech was within normal limits.  There was some psychotic content  noted.  Mood: Depressed and although feeling hopeless, was oriented although  tried to be cooperative despite his irritability.  Thought process was clear  and goal directed.  He was specifically requesting help to be placed in a  group home after discharge, not feeling that he would be safe alone.  Cognitive: He was intact.  Judgment and insight were fair to poor.   ADMISSION DIAGNOSES:   AXIS I:  1. Major depressive disorder, recurrent, severe.  2. Substance abuse.  3. Cocaine abuse.  4. Alcohol abuse.  5.  History of noncompliance with medications including for medical     conditions.   AXIS II:  Deferred.   AXIS III:  1. Acquired immunodeficiency syndrome  2. History of pneumocystis.  3. Pneumocystis carinii pneumonia.   AXIS IV:  Severe problems with primary support group, partner recently died.  and suffering from human immunodeficiency virus, late stage.   AXIS V:  Global assessment of functioning was 30/55   HOSPITAL COURSE:  The patient was admitted, ordered routine p.r.n.  medications, underwent further monitoring, and was encouraged to participate  in individual, group, and milieu therapy.  Librium p.r.n. was ordered and  clarification regarding TB medications, which had been ordered by the Health  Department were continued and other medications resumed.  The patient was  restabilized on  Zyprexa and Effexor and gradually reported a positive  response with improvement in mood, was happy to hear his CD4 count had  risen, and the patient appeared motivation to remain abstinent,  participating fully in treatment, and exhibiting a positive response to  medications without side effects.  Zyprexa was further optimized.   CONDITION ON DISCHARGE:  The patient was discharged in improved condition.  Mood was more euthymic.  Affect: Brighter.  Thought processes: Goal  directed.  Thought content: Negative for dangerous ideation or psychotic  symptoms.  The patient reported motivation to be compliant with the  aftercare plan.   DISCHARGE MEDICATIONS:  1. Zyprexa 15 mg q.h.s.  2. Effexor 37.5 mg three q.a.m.  3. Seroquel 100 mg q.h.s.  4. Isoniazid 300 mg q.a.m.  5. Pyrazinamide 500 mg q.a.m.  6. Myambutol 400 mg three q.a.m.  7. Vitamin B6 50 mg in the morning.  8. Nystatin q.i.d.  9. Kaletra three b.i.d.  10.      Mepron daily, 5 mL suspension.  11.      Epivir 150 mg two q.a.m.  12.      Viread 300 mg q.a.m.  13.      Zithromax 600 mg b.i.d.  14.      Eucerin cream t.i.d.    The patient was to continue medical medications as previously prescribed and  psychotropics as adjusted in the hospital.   FOLLOWUP:  The patient was to follow up with [facility] on July 22 at 12:30.   DISCHARGE DIAGNOSES:   AXIS I:  1. Major depressive disorder, recurrent, severe.  2. Substance abuse.  3. Cocaine abuse.  4. Alcohol abuse.  5. History of noncompliance with medications including for medical     conditions.   AXIS II:  Deferred.   AXIS III:  1. Acquired immunodeficiency syndrome  2. History of pneumocystis.  3. Pneumocystis carinii pneumonia.   AXIS IV:  Severe problems with primary support group, partner recently died.  and suffering from human immunodeficiency virus, late stage.   AXIS V:  Global assessment of functioning on discharge was 55.                                               Jeanice Lim, M.D.    JEM/MEDQ  D:  09/01/2002  T:  09/01/2002  Job:  [id number]

## 2010-05-27 NOTE — H&P (Signed)
NAMEJOSIE, MESA NO.:  [id number]   MEDICAL RECORD NO.:  [id number]          PATIENT TYPE:  IPS   LOCATION:  0306                          FACILITY:  BH   PHYSICIAN:  Vic Ripper, P.A.-C.DATE OF BIRTH:  [birth year]   DATE OF ADMISSION:  03/16/2006  DATE OF DISCHARGE:                       PSYCHIATRIC ADMISSION ASSESSMENT   This is a voluntary admission to the services   DICTATION ENDED AT THIS POINT      Vic Ripper, P.A.-C.     MD/MEDQ  D:  03/17/2006  T:  03/17/2006  Job:  [id number]

## 2010-05-27 NOTE — Discharge Summary (Signed)
NAMELLIAM, HOH NO.:  [id number]   MEDICAL RECORD NO.:  [id number]          PATIENT TYPE:  IPS   LOCATION:  0403                          FACILITY:  BH   PHYSICIAN:  Anselm Jungling, MD  DATE OF BIRTH:  22-Dec-1960   DATE OF ADMISSION:  01/30/2007  DATE OF DISCHARGE:  02/05/2007                               DISCHARGE SUMMARY   IDENTIFYING DATA AND REASON FOR ADMISSION:  This was an inpatient  psychiatric admission for [name], a 50 year old single African American  male who was admitted for depression and suicidal ideation, and  substance abuse.  His nephew had just been killed.  In addition, his  partner died at Thanksgiving.  He had been abusing alcohol and cocaine.  He was last with [place] in March 2008.  He had been doing well on  Wellbutrin, but it was discontinued 6-7 months prior to admission.  The  patient is HIV positive, taking chemotherapy for this.  Please refer to  the admission note for further details pertaining to the symptoms,  circumstances and history that led to his hospitalization.  He was given  an initial Axis I diagnosis of major depressive disorder, recurrent, and  alcohol and cocaine abuse, and bereavement.   MEDICAL AND LABORATORY:  The patient is HIV positive.  He was continued  on his usual regimen of Kaletra and Truvada.  He was medically and  physically assessed by the psychiatric nurse practitioner.  His cell  count time of discharge was 560.  There were no acute medical issues.   HOSPITAL COURSE:  The patient was admitted to the adult inpatient  psychiatric service.  He presented as a slender, but adequately  nourished and normally developed [place] American male who was sad, but  very pleasant and cooperative.  There were no signs or symptoms of  psychosis or thought disorder.  He was a good participant in the  treatment program.   Once we were reasonably sure that he would not have any further alcohol  withdrawal  manifestations, we restarted him on Wellbutrin, which was  well tolerated.  He was also given Seroquel 50 mg q.h.s. to address  agitation and night time insomnia.   On the seventh hospital day the patient appeared appropriate for  discharge.  He agreed to the following aftercare plan.   AFTERCARE:  The patient was to follow-up with Dr. Lolly Mustache  in our  outpatient clinic with an appointment on February 16.  He was also to  follow-up with [facility] [address] in  [place].   DISCHARGE MEDICATIONS:  Kaletra 2 tablets twice daily, Truvada 1 tablet  daily, Seroquel 50 mg q.h.s., and Wellbutrin XL 150 mg daily.   DISCHARGE DIAGNOSES:  AXIS I: Major depressive disorder, recurrent, and  bereavement, and history of alcohol and cocaine abuse, currently in  remission.  AXIS II: Deferred.  AXIS III: HIV positive.  AXIS IV: Stressors severe.  AXIS V: GAF on discharge 60.      Anselm Jungling, MD  Electronically Signed     SPB/MEDQ  D:  02/09/2007  T:  02/09/2007  Job:  [id number]

## 2010-05-27 NOTE — Discharge Summary (Signed)
NAMEMITCHELL, IWANICKI NO.:  [id number]   MEDICAL RECORD NO.:  [id number]          PATIENT TYPE:  IPS   LOCATION:  0403                          FACILITY:  BH   PHYSICIAN:  Anselm Jungling, MD  DATE OF BIRTH:  1960-07-29   DATE OF ADMISSION:  03/20/2007  DATE OF DISCHARGE:  03/25/2007                               DISCHARGE SUMMARY   IDENTIFYING DATA AND REASON FOR ADMISSION:  This was the second  inpatient psychiatric admission to [facility] for [name], a 50 year old single  African American male, who was again admitted for depression.  Please  refer to the admission note for further details pertaining to the  symptoms, circumstances and history that led to his hospitalization.  He  was given initial an axis I diagnosis of major depressive disorder,  recurrent.   MEDICAL AND LABORATORY:  The patient is HIV positive.  He was continued  on his usual Kaletra and Truvada.  He was medically and physically  assessed by the psychiatric nurse practitioner.  There were no acute  medical issues.   HOSPITAL COURSE:  The patient was admitted to the adult inpatient  psychiatric service.  He presented as a slender, but normally developed  and adequately nourished [place] American male who was depressed, flat,  but generally pleasant and cooperative.  He was fully oriented.  There  were no signs or symptoms of psychosis or thought disorder.  He was a  good participant in the treatment program.   He was treated with a regimen of Wellbutrin and Seroquel, with Vistaril  at bedtime for sleep.   After approximately 5 days of inpatient treatment, he indicated that he  was feeling significantly improved and ready for discharge and  outpatient aftercare.  He agreed to the following aftercare plan.   AFTERCARE:  The patient was to follow up at the [facility] on April 02, 2007 for an appointment with their psychiatrist.   DISCHARGE MEDICATIONS:  Seroquel 50 mg q.h.s., Vistaril  50 mg q.h.s.,  Wellbutrin XL 300 mg p.o. daily, Kaletra 2 tablets twice daily, and  Truvada 1 tablet daily.   The patient was instructed to follow up with Loura Halt.   DISCHARGE DIAGNOSES:  AXIS I:  Major depressive disorder recurrent.  AXIS II:  Deferred.  AXIS III:  HIV positive.  AXIS IV:  Stressors, severe.  AXIS V:  GAF on discharge 60.     Anselm Jungling, MD  Electronically Signed    SPB/MEDQ  D:  04/19/2007  T:  04/19/2007  Job:  [id number]

## 2010-05-27 NOTE — Discharge Summary (Signed)
NAMESHADOW, SCHEDLER NO.:  [id number]   MEDICAL RECORD NO.:  [id number]          PATIENT TYPE:  IPS   LOCATION:  0400                          FACILITY:  BH   PHYSICIAN:  Jeanice Lim, M.D. DATE OF BIRTH:  12-09-1960   DATE OF ADMISSION:  01/31/2004  DATE OF DISCHARGE:  02/11/2004                                 DISCHARGE SUMMARY   IDENTIFYING DATA:  This is a 50 year old African-American male, well-known  to [facility] due to multiple admissions, presenting with  history of AIDS, noncompliant with HIV medications.  Noted after overdosing  on heroin and drinking and using cocaine, often on the streets using drugs,  not taking medical medications nor psychiatric medications and then becomes  suicidal.  Gets in mood, feeling that life is not worth living and that he  will soon die anyway.  Usually quite an unstable living situation, limited  support system, although there is community resources that have been helping  him.   MEDICATIONS:  Prescribed azithromycin, Viread, fluconazole, lamivudine and  Kaletra.  In the past, Zoloft and Seroquel.  The patient has been  noncompliant with these.   ALLERGIES:  SULFA, DAPSONE, CLINDAMYCIN.   PHYSICAL EXAMINATION:  Physical and neurologic exam within normal limits.   LABORATORY DATA:  Routine admission labs essentially within normal limits.   MENTAL STATUS EXAM:  Irritable affect.  Poor eye contact.  Some passive,  resistant.  Speech within normal limits.  Mood depressed.  Hopeless,  helpless, worthless.  Positive suicidal ideation.  Feeling that there is no  reason for him to go on.  Physically run down.  Fatigued, tired.  Feeling as  if he may die.  Worried that the illness may be progressing and that he is  facing death.  No overt psychotic symptoms.   ADMISSION DIAGNOSES:   AXIS I:  1.  Major depressive disorder, recurrent, severe with history of psychotic      features versus bipolar  disorder, type 2.  2.  Polysubstance abuse.  3.  Cocaine dependence.  4.  Alcohol abuse.  5.  Heroin abuse.   AXIS II:  Deferred.   AXIS III:  1.  AIDS.  2.  Oral candidiasis.   AXIS IV:  Severe (stressors including grief, loss, problems with primary  support group, housing problems, medical problems).   AXIS V:  30/60.   HOSPITAL COURSE:  The patient was admitted and ordered routine p.r.n.  medications and underwent further monitoring.  Was encouraged to participate  in individual, group and milieu therapy.  The patient was started on  Lexapro.  CD4 count checked.  The patient monitored medically as he was  stabilized on psychotropics.  As usual, the patient improved dramatically  after 2-3 days of being substance-free and on medications and sleeping.  Affect became brighter.  Became more motivated.  Willing to get help and to  be compliant with aftercare plan.  Also reporting motivation to do what was  recommended, including moving to [place] to rejoin his family where he has  healthy support and get out of [place].  However, each time this has been  set up, he has not made it to the bus station.  Often when he gets his  check, changing his mind and ending up using it on drugs and ending up in  the same kind of situation.  The patient advised that this pattern is  getting to the point where he may not live through the next episode and that  he will not be readmitted here.  Therefore, should take advantage of the  aftercare plans arranged and he insisted that he would be on the bus and  send a postcard from [place] and this was facilitated for him to get a cab,  to get the check, to get it cashed, to get to the bus station and then  dropped off and his family was expecting him on a specific bus.  Therefore,  things were in place as much as possible.  The patient still had the option  of course of changing his mind which, based on past history, is not  unlikely.  Therefore,  prognosis is guarded.   CONDITION ON DISCHARGE:  The patient was discharged in improved condition  with no dangerous ideation.  Mood improved.  Affect brighter.  No psychotic  symptoms.  Motivated to be compliant with the aftercare plan, which was set  up for him.  Medication education was given.   DISCHARGE MEDICATIONS:  1.  Neosporin ointment.  2.  Eucerin cream.  3.  Lamictal 25 mg b.i.d.  4.  Ambien 10 mg, 1-2 q.h.s. p.r.n.  5.  Valtrex 500 mg b.i.d.  6.  Diflucan 100 mg q.a.m.  7.  Kaletra 250 mg, 2 q.a.m. and 2 at 8 p.m.  8.  Cymbalta 60 mg q.a.m.  9.  Seroquel 200 mg, 2 at 9 p.m.  10. Viread 300 mg q.a.m.  11. Epivir 150 mg, 2 q.a.m.  12. Protonix 40 mg q.a.m.  13. Symmetrel 100 mg b.i.d.  14. Zithromax 600 mg at 1 p.m. and 6 p.m. every Monday.   FOLLOW UP:  The patient was to follow up with the [facility] if not making it to [facility] on Wednesday, February 10, 2004 at 10 a.m.   DISCHARGE DIAGNOSES:   AXIS I:  1.  Major depressive disorder, recurrent, severe with history of psychotic      features versus bipolar disorder, type 2.  2.  Polysubstance abuse.  3.  Cocaine dependence.  4.  Alcohol abuse.  5.  Heroin abuse.   AXIS II:  Deferred.   AXIS III:  1.  AIDS.  2.  Oral candidiasis.   AXIS IV:  Severe (stressors including grief, loss, problems with primary  support group, housing problems, medical problems).   AXIS V:  Global Assessment of Functioning on discharge 55-60.      JEM/MEDQ  D:  03/23/2004  T:  03/23/2004  Job:  [id number]

## 2010-05-27 NOTE — H&P (Signed)
[name], [name] NO.:  [id number]   MEDICAL RECORD NO.:  [id number]                   PATIENT TYPE:  IPS   LOCATION:  0507                                 FACILITY:  [facility]   PHYSICIAN:  Jeanice Lim, M.D.              DATE OF BIRTH:  09-12-1960   DATE OF ADMISSION:  07/18/2002  DATE OF DISCHARGE:                         PSYCHIATRIC ADMISSION ASSESSMENT   TYPE OF ADMISSION:  This is a voluntary admission.  The identifying  information comes from the patient and accompanying record.  Apparently a  neighbor found the patient in his apartment sitting on the floor crying  after he had attempted to hang himself.  He acknowledges being depressed.  He states his partner of 13 years died in 03/31/22 of AIDS.  His mother also  died shortly thereafter on 05-02-02.  He reports no reason to live.  He is tired of all of his meds.  He is treated for AIDS by Dr. Brayton El at  [facility].  He was also diagnosed with Pneumocystis carinii back in  December and is on TB prophylaxis due to this.  Apparently his last CD-4  count was 90.  Yesterday in the emergency room his urine drug screen was  positive for cocaine.  He was noted to have an exudate on his soft palate  and tonsils.  This was negative for strep.  Treatment for presumed oral  thrush was begun.  Today, the patient reports that he would like assistance  in being placed in a group home.  He no longer feels capable of providing  care for himself.  He continues to be depressed and hopeless, however today  he denies being suicidal.   PAST PSYCHIATRIC HISTORY:  This is his fourth admission to the [facility].  He has had prior admissions in [place] as well.  His last  admission here was June 6 to June 26, 2002.   SOCIAL HISTORY:  He acknowledges having finished high school.  He had been  employed as a Restaurant manager, fast food for 32 years.  As already stated,  his partner of 13 years  died in 2022/03/31 also of AIDS.   FAMILY HISTORY:  He states he has a sister who is treated for schizophrenia.  His father does abuse alcohol.  He denies any other relative with mental  illness.   ALCOHOL AND DRUG HISTORY:  At first the patient denies, however when  confronted with his urine drug screen report he remembers using powder  cocaine on Tuesday.  He minimizes any other alcohol or drug use.  He does  acknowledge some tobacco use.   MEDICAL HISTORY AND PRIMARY CARE Renata Gambino:  He states he was first diagnosed  with AIDS.  He never had a HIV diagnosis in 2003.  Apparently this was after  a diagnosis of pneumocystic carinii pneumonia was made in December.  He  states he is anemic, however his CBC in the emergency room does not reveal  that he is anemic.   CURRENT MEDICATIONS:  1. INH 300 mg p.o. q.d.  2. Pyrazinamide 1500 mg p.o. q.d.  3. Pyridoxine 50 mg p.o. q.d.  4. Ethambutol 1200 mg p.o. q.d.  5. He was prescribed Nystatin suspension 550 swish and swallow for two days     q.i.d. and he was to continue it for two days after his symptoms     resolved.  6. Antiviral - he takes Kaletra 133/3/33.33 p.o. b.i.d.  7. Mepron suspension one teaspoon q.d. p.o.  8. Viread 300 mg one p.o. q.d.  9. Epivir 300 mg one p.o. q.d.  10.      Zithromax 600 mg two tablets a week.  11.      At discharge back in June, he was discharged on Lexapro 20 mg p.o.     q.d. and Ambien 10 mg p.o. at h.s.  However, yesterday he was begun on     Effexor 75 mg one p.o. q.d. and Zyprexa 10 mg p.o. at h.s.   DRUG ALLERGIES:  He states he has a drug allergy to SULFA.   PHYSICAL EXAMINATION:  GENERAL:  This is a 50 year old African-American male  who appears to be his stated age.  He also appears chronically ill.  He is  somewhat thin.  His hygiene could be better.  He is somewhat unkempt.  Other  than being thin today, he had no remarkable physical findings.  VITAL SIGNS:  His temperature was 97.8.  His  pulse was 72.  Respirations 20,  and his weight was 151 pounds.  His blood pressure was 124/76.  HEENT:  Revealed some chapped lips.  No exudate was seen today.  He denied  any pain or discomfort in his throat or soft palate area.  CHEST:  Specifically, his chest was clear to auscultation and percussion.  HEART:  Revealed a regular rate and rhythm without murmurs, rubs, or  gallops.  ABDOMEN:  Soft and nontender with no hepatosplenomegaly.  No other masses or  tenderness are noted.  MUSCULOSKELETAL:  Revealed no clubbing, cyanosis, or edema.  Cranial nerves  II-XII are grossly intact.   MENTAL STATUS EXAMINATION:  He is alert and oriented x3.  His appearance and  clothing are somewhat unkempt.  His hygiene could be a little better.  His  speech was normal.  There was no psychotic content noted.  His mood was  depressed.  He does acknowledge feeling hopeless.  He is irritable and is  oriented enough to realize that being cooperative is more helpful than being  irritable.  His thought process is clear.  He was goal oriented.  Specifically, he asked for help in being placed in a group home post  discharge.  Cognitively, his memory and concentration are intact.  His  judgment and insight are poor.  His abstracting ability is concrete.   DIAGNOSES:   AXIS I:  1. Major depressive disorder, recurrent, severe.  2. Substance abuse - cocaine and alcohol.  3. Noncompliance with meds.   AXIS II:  Deferred.   AXIS III:  1. AIDS.  2. History for pneumocystis carinii.   AXIS IV:  Severe.  He had problems with primary support group who have all  recently died.  Problems related to social environment and housing problem.  He also has medical problems obviously with HIV.   AXIS V:  GAF at the moment is  30.    PLAN:  Admit to the floor for withdrawal from alcohol and cocaine.  He is to  provide safety to help resolve suicidal ideation.  Help arrange placement in a group home.  Continue meds  for AIDS and pneumocystis.  Re-establish  antidepressant therapy and compliance.  Tentative length of stay and  discharge plan is four to five days.      Vic Ripper, P.A.-C.               Jeanice Lim, M.D.    MD/MEDQ  D:  07/19/2002  T:  07/19/2002  Job:  [id number]

## 2010-05-27 NOTE — Discharge Summary (Signed)
NAMEKENYATTA, KEIDEL NO.:  [id number]   MEDICAL RECORD NO.:  [id number]          PATIENT TYPE:  IPS   LOCATION:  0400                          FACILITY:  [facility]   PHYSICIAN:  Jeanice Lim, M.D. DATE OF BIRTH:  02-25-1960   DATE OF ADMISSION:  05/28/2004  DATE OF DISCHARGE:  06/06/2004                                 DISCHARGE SUMMARY   IDENTIFYING DATA:  This patient presented in the emergency room at [facility] complaining of suicide ideation and reporting that he had had suicide  ideation since Mother's Day and auditory hallucinations with deceased  mother's voice.  Planned to overdose on Seroquel.  Denied any followup since  last admission.  One of numerous admissions to this facility and other  facilities.  The patient acknowledges that he could use a case manager and  was requesting a payee for his SSI.  Past psych history:  Numerous  admissions here at [facility] and other facilities.  Medical problems:  HIV  for which he is treated.   ROUTINE ADMISSION LABS:  Essentially within normal limits.  CD4 count  pending.   MENTAL STATUS EXAM:  A bit sedated, sleepy, but arousable.  Alert and  oriented x3 when aroused.  Appropriate grooming and dressing, appeared to be  adequately nourished.  Speech not pressured.  Mood somewhat irritable,  affect congruent.  Thought process mostly goal directed, agreed to need for  case manager.  Judgment and insight were fair.  Cognition intact.  Reported  auditory hallucinations but did not appear to be responding to any at this  time.  The patient admitted to conflict with another patient last night  requiring p.r.n. emergency sedation.   ADMISSION DIAGNOSES:   AXIS I:  1.  Cocaine dependence.  2.  Alcohol dependence.  3.  Major depressive disorder, severe, with psychotic features.  4.  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   AXIS II:  Deferred.   AXIS III:  Acquired immunodeficiency syndrome with history of a  low CD4  count.   AXIS IV:  Moderate to severe.   AXIS V:  30/50.   HOSPITAL COURSE:  The patient was admitted and ordered routine p.r.n.  medications, underwent further monitoring, and was encouraged to participate  in individual, group and milieu therapy.  The patient as usual reported  motivation to remain abstinent and live closer to his family or live with  friends, but get out of [facility] which he has said on numerous occasions  before, however.  He reported this in fact what he wanted to do at this  time.  The patient was stabilized on medications after several days of  adjusting medications and recovering with sleep and being detoxed.  The  patient was restarted on medications, reported a positive response and was  discharged in improved condition with no over psychotic symptoms, no  dangerous ideation, mood euthymic and affect stable, thought process goal  directed, apparently future oriented.  Risk/benefit ratio and alternative  treatments regarding medications, risk of substance use on mood and health  condition, and medications and precautions related to  medications were all  reviewed.  The patient again was given medication education and discharged  on:  1.  Protonix 40 mg daily.  2.  Epivir 150 mg 2 tablets daily.  3.  Viread 300 mg daily.  4.  Symmetrel 100 mg b.i.d.  5.  Wellbutrin XL 150 mg q.a.m.  6.  Kaletra 200 2 tablets twice a day.  7.  Eucerin cream 1-3 times daily.  8.  Cymbalta 30 mg daily.  9.  Seroquel 100 mg 1 q.h.s. p.r.n. agitation and as needed p.r.n.      agitation, up to once every 24 hours.   DISPOSITION:  The patient was to have mental health followup in [place],  was planning on going directly to a bus with his check and to get on the bus  with assistance from his case manager.  The patient had followup scheduled  for June 1 in case he did not make it out of [place], which is  unfortunately what usually happens despite a good aftercare  plan in place.  The patient had no risk issues at time of discharge.   DISCHARGE DIAGNOSES:   AXIS I:  1.  Cocaine dependence.  2.  Alcohol dependence.  3.  Major depressive disorder, severe, with psychotic features.  4.  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   AXIS II:  Deferred.   AXIS III:  Acquired immunodeficiency syndrome with history of a low CD4  count.   AXIS IV:  Moderate to severe.   AXIS V:  Global assessment of function on discharge was 55.       JEM/MEDQ  D:  07/12/2004  T:  07/12/2004  Job:  [id number]

## 2010-05-27 NOTE — H&P (Signed)
NAMEDUSAN, LIPFORD NO.:  [id number]   MEDICAL RECORD NO.:  [id number]          PATIENT TYPE:  IPS   LOCATION:  0405                          FACILITY:  [facility]   PHYSICIAN:  Jeanice Lim, M.D. DATE OF BIRTH:  08/14/1960   DATE OF ADMISSION:  03/05/2004  DATE OF DISCHARGE:                         PSYCHIATRIC ADMISSION ASSESSMENT   IDENTIFYING INFORMATION:  A 50 year old single African-American male  voluntarily admitted March 05, 2004.   HISTORY OF PRESENT ILLNESS:  The patient presents with a history of  depression, polysubstance abuse.  The patient reports he had recently been  discharged from [facility] approximately a month ago.  He  states that rather than going to [place] to live with a sister he decided to  move in with someone and relapsed on drinking and cocaine use.  He feels  very hopeless and was wanting to hurt himself.  He has not followed up with  his medical or psychiatric appointments and has not slept for the past few  days and reports decreased appetite.   PAST PSYCHIATRIC HISTORY:  The patient has had multiple admissions to  [facility], was here in January of 2006 for similar symptoms.   SOCIAL HISTORY:  He is a 50 year old single African-American male with no  children.  He lives alone.  He is on disability.   FAMILY HISTORY:  None.   ALCOHOL DRUG HISTORY:  The patient smokes, has been drinking, with his last  drink on March 05, 2004.  Drinks in the morning.  Reports DTs in the  past, denies any seizure activity.   PAST MEDICAL HISTORY:  Primary care Sadia Belfiore is Dr. Elita Quick at  [facility].  Medical problems are HIV.   MEDICATIONS:  Has been on Zithromax taking 600 mg twice a week, Protonix 40  mg daily, Kaletra 2 pills b.i.d., Epivir 300 mg 1 tab daily, Viread 300 mg 1  tab daily.   DRUG ALLERGIES:  SULFA, BACTRIM, DAPSONE, CLINDAMYCIN.   PHYSICAL EXAMINATION:  The patient was assessed at  [facility].  This is a  tired, somewhat unkempt male.  He has a whitish lesions noted to his lips.  His temperature is 97.8, 95 heart rate, 20 respirations, blood pressure is  120/90.  176 pounds.   LABORATORY DATA:  His alcohol level is 130.  Urine drug screen is positive  for cocaine.  Hemoglobin is 12.5, hematocrit 37.3.  TSH is 2.802.   MENTAL STATUS EXAM:  He is a sleepy middle-aged male, unsteady, tired  appearing.  Speech is clear, normal pace and tone.  The patient feels tired  and depressed.  The patient appears the same, very flat and tired appearing,  somewhat of an unsteady gait.  Thought processes are coherent, no evidence  of psychosis.  Cognitive function intact.  Memory is fair, judgment is poor,  insight is poor, poor impulse control.   ADMISSION DIAGNOSES:   AXIS I:  1.  Major depressive disorder, severe, recurrent.  2.  Polysubstance abuse.   AXIS II:  Deferred.   AXIS III:  HIV.   AXIS IV:  Problems related to social environment, lack of support, other  psychosocial problems related to chronic substance abuse and medical  problems.   AXIS V:  Current is 30, estimated this past year 55-60.   PLAN:  Stabile mood and thinking.  The patient will be placed on the 400  hall for close monitoring.  We will resume his medications, work on relapse  prevention.  Case manager is to look at living situation and potential rehab  programs available.  The patient is to be medication compliant with both  psychiatric and medical followup.   TENTATIVE LENGTH OF CARE:  5-7 days.      JO/MEDQ  D:  03/09/2004  T:  03/09/2004  Job:  [id number]
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
## 2010-05-27 NOTE — Discharge Summary (Signed)
NAMESHLOK, RAZ NO.:  [id number]   MEDICAL RECORD NO.:  [id number]          PATIENT TYPE:  IPS   LOCATION:  0502                          FACILITY:  [facility]   PHYSICIAN:  Geoffery Lyons, M.D.      DATE OF BIRTH:  Jan 03, 1961   DATE OF ADMISSION:  07/29/2008  DATE OF DISCHARGE:  08/03/2008                               DISCHARGE SUMMARY   CHIEF COMPLAINT AND PRESENT ILLNESS:  This was one of several admissions  to [facility] for this 50 year old single male  voluntarily admitted.  Relapsed 3 weeks prior to this admission on  cocaine, marijuana, and alcohol, grieving the death of his nephew who  was age 60 and he was shot in [place].  His nephew was apparently  involved in the drug trade.  He feels bad about his death.  He relapsed  about 3 weeks prior to this admission.  Had been for the last week  snorting cocaine most every day, smoking marijuana, drinking large  amounts of alcohol.  He was beginning to feel out of control.  He had  been acutely suicidal for about 5 days, drinking, thinking of  overdosing.  Has history of previous suicide attempts by overdose and an  attempt at hanging.  Asking for help getting him back on his feet.  He  had lapsed on taking his Wellbutrin which previously worked for him.   PAST PSYCHIATRIC HISTORY:  Last admission was November 2009.  History of  major depressive disorder, had been on Wellbutrin.  Followed up through  the [facility].   ALCOHOL AND DRUG HISTORY:  As already stated, persistent use of multiple  substances.   MEDICAL HISTORY:  1. HIV positive.  2. Acute bronchitis.   MEDICATION:  1. Truvada.  2. Kaletra.  3. Wellbutrin which he was not taking.  4. Albuterol inhaler.  5. Azithromycin.  6. Tussionex.   PHYSICAL EXAM:  Failed to show any acute findings.   LABORATORY WORKUP:  SGOT 38, SGPT 25, total bilirubin 0.6.  Alcohol  level 107.  CBC, white blood cells 5.9, hemoglobin 13.7.   blood  chemistry within normal limits.  UDS positive for marijuana and cocaine.   MENTAL STATUS EXAM:  Reveals a fully alert cooperative male.  Good eye  contact.  Speech was normal rate, tempo, and production.  Mood  depressed.  Affect depressed.  In bed not feeling well physically wise.  Concerned with his having relapsed, still grieving the death of his  nephew, pretty overwhelmed but no active thoughts of suicide.  No  homicidal ideas.  No delusions.  Cognition well preserved.   ADMITTING DIAGNOSES:  AXIS I:  1. Major depressive disorder.  2. Alcohol/cocaine abuse, rule out dependence.  AXIS II:  No diagnosis.  AXIS III:  1. HIV positive.  2. Acute bronchitis.  AXIS IV:  Moderate.  AXIS V:  Upon admission, 35.  Highest Global Assessment of Functioning  in the last year 60.   COURSE IN THE HOSPITAL:  He was admitted.  He was placed back on his  medication.  He  was detoxified with Librium.  July 30, 2008, he was  upset, tearful, dealing with the physical symptoms, the tiredness, the  weakness, the acute loss of his nephew, as well as the relapse and his  withdrawal.  July 31, 2008, he was still feeling pretty down, able to  open up and talk about the losses, his frustrations.  He felt he needed  to go to rehab with __________ and he was accepted to go to [facility].  On  August 02, 2008,  he was in full contact with reality.  There were no  active suicidal or homicidal ideas.  No hallucinations.  No delusions.  Objectively, he was better.  He has been able to talk and deal with the  deal with the loss of the nephew.  He was able to get himself back on  medications, get himself back together, and he was looking forward to  pursuing further work on recovery through [facility] in [place], [place].   DISCHARGE DIAGNOSES:  AXIS I:  1. Cocaine/alcohol dependence.  2. Major depressive disorder.  AXIS II:  No diagnosis.  AXIS III:  1. HIV  positive.  2. Bronchitis.  AXIS IV:  Moderate.  AXIS V:  Upon discharge, 50 to 55.   Discharged on:  1. Kaletra 2 tabs twice a day.  2. Truvada 200/300 daily.  3. Wellbutrin XL 300 mg per day.   FOLLOWUP:  Through [facility] in [place], [place].      Geoffery Lyons, M.D.  Electronically Signed     IL/MEDQ  D:  08/31/2008  T:  08/31/2008  Job:  [id number]

## 2010-05-27 NOTE — Discharge Summary (Signed)
NAMECHRLES, SELLEY NO.:  [id number]   MEDICAL RECORD NO.:  [id number]          PATIENT TYPE:  IPS   LOCATION:  0306                          FACILITY:  [facility]   PHYSICIAN:  Jeanice Lim, M.D. DATE OF BIRTH:  05/08/1960   DATE OF ADMISSION:  10/14/2003  DATE OF DISCHARGE:  10/22/2003                                 DISCHARGE SUMMARY   IDENTIFYING DATA:  This is a 50 year old single African-American male  voluntarily admitted.  Presented with a history of depression and suicidal  thoughts.  Been thinking of overdosing on heroin.  Reports recent death of  partner.  Had been drinking up to a six-pack a day for the past four weeks.  Admitted stopped taking HIV medications and had been using cocaine as well  for the last two days.  Presenting very depressed, psychomotor retarded with  multiple physical complaints, feeling weak and there was no point in going  on living.  The patient presenting as he has multiple times in the past,  following substance use, stopping HIV medications and worsening of  depressive symptoms.  The patient was last here in February of 2005 for  depression, drug and alcohol use and was noncompliant with outpatient  treatment.  Currently on disability.  The patient's medical history is  significant for AIDS.  He had been noncompliant with medications for almost  30 days, including antiviral medications and also past antidepressants.   ALLERGIES:  SULFA, BACTRIM, DAPSONE and CLINDAMYCIN.   PHYSICAL EXAMINATION:  Physical and neurologic exam within normal limits.   LABORATORY DATA:  Routine admission labs essentially within normal limits.  Drug screen positive for cocaine.  Alcohol level 42.  Sodium slightly low at  134.  B12 level was 326.   MENTAL STATUS EXAM:  Alert, middle-aged male.  No eye contact.  Speech  clear.  Feeling tired.  Some mild irritability.  Resistant, psychomotor  retarded with poverty of thought and speech.   Complaining of nausea,  weakness and fatigue.  There is no overt psychotic symptoms.  Cognitively  intact.  Judgment and insight were impaired.  He was a poor historian.  Somewhat unwilling to give details.  Presenting again as he has many times  in the past with often very quick improvement after two days of recovering  physically to showing improvement in mood and thinking and judgment and  insight, once he is restarted on medications and remains clean for several  days.   ADMISSION DIAGNOSES:   AXIS I:  1.  Major depressive disorder, recurrent, severe.  2.  Alcohol dependence.  3.  Cocaine dependence.   AXIS II:  Deferred.   AXIS III:  Acquired immune deficiency syndrome.   AXIS IV:  Psychosocial stressors related to severe chronic life-threatening  medical problems, lack of support, history of noncompliance with medical and  psychotropic medications and sequelae of substance use.   AXIS V:  30/55.   HOSPITAL COURSE:  The patient was admitted and ordered routine p.r.n.  medications and underwent further monitoring.  Was encouraged to participate  in individual, group and milieu therapy.  The patient was agreeable to  restart Zoloft and Seroquel.  The patient had had positive response to  medications in the past and, the longer he was abstinent from medications in  the hospital and started on psychotropics, the more dramatic he showed  improvement.  He, again, showed tolerance to medications and, after a couple  of days of multiple physical complaints, began to show a positive response  and tolerance with improvement in mood, brighter affect, sense of future,  hope, resolution of suicidal ideation, improved coping skills, judgment and  insight and awareness of the severity and impact of his addiction on his  mood and medical condition.   CONDITION ON DISCHARGE:  The patient was discharged in improved condition  with no dangerous ideation.  Mood was euthymic.  Affect bright.  Thought  processes goal directed.  Able to problem-solve.  Given medication  education.   DISCHARGE MEDICATIONS:  1.  Multivitamin daily.  2.  Symmetrel 100 mg b.i.d.  3.  Nizoral 2% shampoo daily.  4.  Viread 300 mg daily.  5.  Lamivudine 150 mg, 2 tablets daily.  6.  Protonix 40 mg daily.  7.  Kaletra 133.3/33.3 caps, 3 twice a day.  8.  Zithromax 600 mg after supper, taking 2 once per week.  The patient      reported usually only being able to tolerate one of these once per week.  9.  Lexapro 10 mg q.a.m.  10. Seroquel 100 mg q.h.s.  11. Lamictal 25 mg daily to target recurrent depressive episodes and      possible cyclicity and questionable bipolar 2 due to rapid response to      medications.   FOLLOW UP:  The patient was discharged to follow up with [facility] and [facility]  on [address] in [place].  To go to the walk-in clinic Monday  through Friday, 1-4 p.m.  To abstain from all substances of abuse and to  seek all substance abuse resources available to him.   The patient was discharged, again, in improved condition with no risk  issues.  Again, reporting verbal understanding of the importance of his  compliance with follow-up and the fact that he is likely to do well if he  can remain on medications regarding his medical condition as well as his  mood condition and abstain from substances of abuse.     Jame   JEM/MEDQ  D:  11/29/2003  T:  11/29/2003  Job:  [id number]

## 2010-05-27 NOTE — Discharge Summary (Signed)
NAMEFAIZ, WEBER NO.:  [id number]   MEDICAL RECORD NO.:  [id number]                   PATIENT TYPE:  IPS   LOCATION:  0502                                 FACILITY:  [facility]   PHYSICIAN:  Geoffery Lyons, M.D.                   DATE OF BIRTH:  July 15, 1960   DATE OF ADMISSION:  03/25/2002  DATE OF DISCHARGE:  03/31/2002                                 DISCHARGE SUMMARY   CHIEF COMPLAINT AND PRESENT ILLNESS:  This was the second admission to [facility] for this 50 year old African-American male, single,  voluntarily admitted.  Presented to the emergency room depressed, reporting  that he had been thinking about killing himself.  Partner of 12 years died  of AIDS two days prior to this admission.  After an extended illness,  grandmother died two months prior to this admission and mother has cancer.  Tried cocaine.  __________  before this admission.  Endorsed suicidal  ideation without a plan.   PAST PSYCHIATRIC HISTORY:  Second time at [facility], initially for  depression and substance use, paranoia.  Previous admission to [facility],  __________  and [facility].   ALCOHOL/DRUG HISTORY:  History of alcohol and cocaine abuse.   PAST MEDICAL HISTORY:  HIV positive.   MEDICATIONS:  Viread, Kaletra and Risperdal, although not taking.   PHYSICAL EXAMINATION:  Performed and failed to show any acute findings.   MENTAL STATUS EXAM:  Alert, cooperative male initially irritable and  guarded.  Then focused, pleasant.  Mood depressed.  Affect depressed.  Thought positive for suicidal ruminations, planning to overdose but more  hopeful.  Actively grieving all the losses.  No homicidal ideation.  No  psychosis.  Cognition well-preserved.   ADMISSION DIAGNOSES:   AXIS I:  1. Major depression with psychotic features.  2. Alcohol and cocaine abuse; rule out dependence.   AXIS II:  No diagnosis.   AXIS III:  1. Acquired immune deficiency  syndrome.  2. __________  candidiasis.   AXIS IV:  Moderate.   AXIS V:  Global Assessment of Functioning upon admission 49; highest Global  Assessment of Functioning in the last year 60.   HOSPITAL COURSE:  He was admitted and started intensive individual and group  psychotherapy.  He was given Ambien for sleep and Ativan as needed for  anxiety.  He was kept on the Kaletra, the Viread, Epivir, Zithromax, Mepron.  He was given some Claritin.  He was started on __________  6/25 mg daily and  he was given some Seroquel at bedtime.  On March 31, 2002, he was in full  contact with reality.  His mood had improved.  His affect was bright.  He  was denying any suicidal or homicidal ideation.  No hallucinations.  Mood  was euthymic.  Positive about the disposition of eventually going to stay  with his sister in  [place].  He was endorsing no suicidal or homicidal  ideation, feeling much better, for which he was discharged to outpatient  follow-up.   DISCHARGE DIAGNOSES:   AXIS I:  1. Major depression with psychotic features.  2. Alcohol and cocaine abuse.   AXIS II:  No diagnosis.   AXIS III:  Acquired immune deficiency syndrome.   AXIS IV:  Moderate.   AXIS V:  Global Assessment of Functioning upon discharge 55.   DISCHARGE MEDICATIONS:  1. Claritin-D 1 daily.  2. Kaletra 3 tabs twice a day.  3. Viread 300 mg, 1 daily.  4. Epivir 150 mg, 2 daily.  5. Zithromax 600 mg, 2 every day for seven days.  6. Mepron 750 mg daily.  7. __________ 6/25 at bedtime.  8. Seroquel 25 mg at bedtime.  9. Diflucan 100 mg daily.  10.      Protonix 40 mg daily.  11.      Ativan 0.5 mg every six hours as needed.   FOLLOW UP:  [facility].                                               Geoffery Lyons, M.D.    IL/MEDQ  D:  04/30/2002  T:  05/04/2002  Job:  [id number]

## 2010-05-27 NOTE — H&P (Signed)
[name], [name] NO.:  [id number]   MEDICAL RECORD NO.:  [id number]          PATIENT TYPE:  IPS   LOCATION:  0306                          FACILITY:  [facility]   PHYSICIAN:  Jeanice Lim, M.D. DATE OF BIRTH:  1960/02/26   DATE OF ADMISSION:  10/14/2003  DATE OF DISCHARGE:  10/22/2003                         PSYCHIATRIC ADMISSION ASSESSMENT   IDENTIFYING INFORMATION:  This is a 50 year old single African-American male  voluntarily admitted on October 14, 2003.   HISTORY OF PRESENT ILLNESS:  The patient presents with a history of  depression and suicidal thoughts.  Has been thinking about overdosing on  heroin.  The patient reports a recent death of his partner.  He has also  been drinking up to a six-pack a day for the past four weeks.  Has been  using cocaine as well with his last use two days prior to this admission.  He feels depressed.  He reports he had been having little sleep.  Denied any  psychotic symptoms.   PAST PSYCHIATRIC HISTORY:  The patient was here in February of 2005 for  depression.  Drug and alcohol use.  No current outpatient treatment.   SOCIAL HISTORY:  This is a 50 year old single African-American male with no  children.  Currently on disability.   FAMILY HISTORY:  Unknown.   ALCOHOL/DRUG HISTORY:  The patient smokes.  Has been drinking daily up to a  six-pack for the past four weeks.  Cocaine use as well.   PRIMARY CARE PHYSICIAN:  Unknown.   MEDICAL PROBLEMS:  The patient has AIDS.   MEDICATIONS:  Has been noncompliant with his medications for 30 days with  his antiviral medications and also on a past antidepressant.   ALLERGIES:  SULFA, BACTRIM, DAPSONE and CLINDAMYCIN.   PHYSICAL EXAMINATION:  The patient was assessed at [facility] which was  reviewed.  Vital signs were stable with temperature 98.5, heart rate 103,  respirations 20, blood pressure 110/61.  He is 158 pounds.  He appears well-  nourished.   LABORATORY  DATA:  Urine drug screen is positive for cocaine.  Hemoglobin  12.5.  Alcohol level was 42.  CMET is within normal limits except for sodium  at 134.  B12 level is 326.  Folate is 9.6.   MENTAL STATUS EXAM:  He is an alert, middle-aged male with no eye contact.  Speech is clear.  The patient feels tired.  Affect shows some mild  irritability.  Does not want to be questioned for too long of a length of  time.  Thought processes are coherent.  No evidence of psychosis.  Cognitive  function is intact.  Memory is fair.  Judgment is fair.  Insight is limited.  Poor historian and somewhat unwilling historian.   DIAGNOSES:   AXIS I:  1.  Major depressive disorder.  2.  Alcohol abuse.  3.  Cocaine abuse.   AXIS II:  Deferred.   AXIS III:  Acquired immune deficiency syndrome.   AXIS IV:  Other psychosocial problems related to losses, medical problems,  lack of support.   AXIS V:  Current  30; past year 31.   PLAN:  Admission for depression and suicidal thoughts and substance abuse.  Contract for safety.  Stabilize mood and thinking.  Will work on relapse  while patient is hospitalized.  The patient is to increase coping skills.  Will resume Zoloft and Seroquel, initially for symptoms and patient is to  follow up with [facility] for blood work and monitoring of  medications.  The patient is to remain alcohol and drug-free.  Medication  compliance was discussed with the patient.   TENTATIVE LENGTH OF STAY:  Five to six days.     Jani   JO/MEDQ  D:  10/22/2003  T:  10/23/2003  Job:  [id number]

## 2010-05-27 NOTE — H&P (Signed)
NAMEMAHAMUD, METTS NO.:  [id number]   MEDICAL RECORD NO.:  [id number]                   PATIENT TYPE:  IPS   LOCATION:  0302                                 FACILITY:  [facility]   PHYSICIAN:  Geoffery Lyons, M.D.                   DATE OF BIRTH:  04-02-1960   DATE OF ADMISSION:  02/07/2002  DATE OF DISCHARGE:  02/11/2002                         PSYCHIATRIC ADMISSION ASSESSMENT   IDENTIFYING INFORMATION:  This is a 50 year old African-American male, who  is single.  This is a voluntary admission.   HISTORY OF PRESENT ILLNESS:  This patient presented to mental health  requesting help.  He stated he was going to kill himself by overdosing on  heroin and had gone so far as buying the heroin but had not yet overdosed  himself.  He has a history of alcohol abuse and cocaine abuse and was  treated at alcohol and drug services through last week.  He reports that he  then, shortly after being released, relapsed which he feels was triggered by  grief over his grandmother's death.  The patient also reports he had been  off of medications for an unclear period, approximately 1-2 weeks.  Today,  the patient is resistant and uncooperative with interview.  He is irritable  and guarded and is generally a poor historian.  He turns his head, refuses  to speaks, says he does not want to be bothered with talking in spite of  much encouragement.  His history is primarily taken from the record.  We did  ask him what his goal was today and he reports that his goal is to get out  for a Tuesday physician appointment so that he can get some new medications  to address his HIV.  The patient endorses continued feelings of suicidal  ideation.  He denies that he is having auditory or visual hallucinations  today.   PAST PSYCHIATRIC HISTORY:  The patient was seen at [facility] from January 25, 2002  to January 28, 2002 according to the referral.  This is the patient's first  [facility] admission.   He has a history of prior admissions to [facility],  [facility] and [facility] in the distant past and mental health notes that  he has been previously hospitalized for paranoia, psychosis and cocaine and  alcohol abuse.   SOCIAL HISTORY:  This is a single African-American male who was born in [place].  Moved to [place] and grew up here.  He is currently living in  [place] but is not working.  He has been disabled for several years.  He  does have family in this area.  He denies any current legal charges.   FAMILY HISTORY:  Unclear.   ALCOHOL/DRUG HISTORY:  As noted above.  The patient declines to give any  further details.   PAST MEDICAL HISTORY:  The patient is followed by the  [facility] for  primary care and has been seen in the infectious disease clinic at  [facility] in November for evaluation of his HIV status.  Medical problems:  The patient reports he was diagnosed with AIDS in November of 2002.  He  reports a significant increase in viral load recently and decrease in T  cells.  He was last seen at [facility] in November of 2003 when they drew  labs on them.  He has to return Tuesday to obtain new medication.  His  appointment is Tuesday.  Past medical history is remarkable for a recent  history of Pneumocystis carinii pneumonia x 3.  He also has a history of  acid reflux, he reports.   MEDICATIONS:  In the past have been azithromycin, prophylaxis for PCP,  fluconazole for oral candidiasis and Metron.  The patient also reports, for  his mental health, he was taking Zoloft and Risperdal.  Doses unclear.  The  patient reports he has not taken any medications regularly in at least one  or two weeks.   ALLERGIES:  SULFA DRUGS, CECLOR, CLINDOMYCIN and BACTRIM.   REVIEW OF SYSTEMS:  Generalized body aches and patient reports that he is  coughing and he feels that he has general achiness throughout his chest and  feeling that his chest is tight and that  he is quite fatigued.   POSITIVE PHYSICAL FINDINGS:  The patient declines to cooperate with much of  a physical examination at this time.  Will not submit to full exam.  VITAL SIGNS:  On admission to the unit, temperature 97.7, pulse 87,  respirations 28, blood pressure 120/89.  He is 6 feet tall and weighs 146  pounds.  GENERAL:  This is a thin, somewhat cachectic appearing African-American male  who is in mild distress primarily coughing frequently with a cough  productive of thick sputum, which is gray in color.  HEAD:  Normocephalic and atraumatic.  EENT:  Pupils are generally equal.  Ocular tracking is normal.  His sclerae  is nonicteric.  Hearing is intact to normal voice.  No evidence of  rhinorrhea, nasal stuffiness or facial tenderness.  Oropharynx is remarkable  for some whitish lesions around the edges of his mouth with what appears to  be possibly aphthous ulcers.  He will not give [place] a very good view for a  full oral exam.  His AC nodes are remarkably enlarged between 1-2+.  PC  nodes nonpalpable.  CARDIOVASCULAR:  S1 and S2 heard.  No clicks, murmurs or gallops.  Apical  pulse is synchronous with radial pulse.  No evidence of peripheral edema.  Peripheral pulses are 2+/5.  LUNGS:  Scattered coarse rhonchi and mild finely scattered wheezes but  cleared with deep breathing and coughing.  ABDOMEN:  Flat, nontender.  GENITALIA:  Deferred.  MUSCULOSKELETAL:  Gait is grossly normal.  Arm swing normal.  No joint  swelling or erythema observed.  NEURO:  Full neuro exam not done.  The patient's facial symmetry is  definitely present.  The patient's balance is normal.  Arm swing normal.  Grip strength equal bilaterally.  Motor is smooth.  Sensory appears grossly  intact.  No focal findings.   LABORATORY DATA:  Grossly normal CBC.  His hemoglobin is 12.4, hematocrit 38.7, MCV 88.9, platelets 175,000.  Metabolic panel generally within normal  limits.  Electrolytes normal.  BUN 14,  creatinine 1.1.  His bilirubin is  normal.  His SGOT was mildly elevated at 44.  His  SGPT was normal at 25.  Alcohol level was less than 5.  Urine drug screen positive for cocaine.  TSH  normal at 1.367.   MENTAL STATUS EXAM:  This is a thin, disheveled, fully alert male with an  irritable and guarded affect.  He is resistant to interview.  He does agree  to come to the treatment room with me but is generally uncooperative.  He is  angry that he has to answer questions.  He says he just does not feel well  enough and insists on going back to bed and not speaking to me.  He answers  very few questions.  Speech displays mild pressure.  Affect is guarded and  irritable.  Mood is depressed and irritable.  Thought processes are  remarkable for auditory hallucinations which consist of mumbling voices that  seem to be, when asked directly about this, some vague sounds in the  background, nothing that is distinguishable.  He is positive for suicidal  ideation and displays signs of paranoia.  No evidence of homicidal ideation  or visual hallucinations.  Cognitively, he is intact and oriented x 3.  Intelligence is average.  Insight poor.  Judgment and impulse control  impaired.   DIAGNOSES:   AXIS I:  1. Rule out schizoaffective disorder.  2. Polysubstance abuse and dependence.   AXIS II:  Deferred.   AXIS III:  1. Acquired immune deficiency syndrome by history.  2. Acid reflux.  3. Respiratory infection not otherwise specified.   AXIS IV:  Moderate to severe (medical problems and grief following the death  of his grandmother).   AXIS V:  Current 18; past year 36.   PLAN:  Voluntarily admit the patient with 15-minute checks in place with a  goal of alleviating his suicidal ideation.  We have elected to start him on  Symmetrel 100 mg p.o. b.i.d. for his cocaine dependence and will start him  on Librium 25 mg p.o. q.6h. p.r.n. for any signs of alcohol withdrawal,  although he is  displaying none of these signs so far.  We have placed him on  trazodone 50 mg q.h.s. p.r.n. for insomnia and will restart his Zoloft 25 mg  daily and Risperdal 0.25 mg b.i.d. and 0.5 mg q.h.s.  We will not do any STD  testing of this patient at this time since he reports that he is current and  regular with his primary care Kaylaann Mountz, who he will see Tuesday.  Meanwhile,  for his physical problems, we will restart his azithromycin with 500 mg p.o.  today and will continue him on 250 mg daily until he sees his primary care  doctor.  Will restart his Diflucan at 200 mg p.o. today and then 100 mg p.o.  x 3 weeks.  Will give him Mepron 1500 mg p.o. daily and monitor his vitals  signs every shift for any signs of spike in fever.  Meanwhile, we will give  him some Pepcid AC 20 mg p.o. b.i.d. for GERD.   ESTIMATED LENGTH OF STAY:  Three to five days.    Margaret A. Scott, N.P.                   Geoffery Lyons, M.D.    MAS/MEDQ  D:  02/11/2002  T:  02/11/2002  Job:  [id number]
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
## 2010-05-27 NOTE — Discharge Summary (Signed)
[name], [name] NO.:  [id number]   MEDICAL RECORD NO.:  [id number]                   PATIENT TYPE:  IPS   LOCATION:  0302                                 FACILITY:  [facility]   PHYSICIAN:  Geoffery Lyons, M.D.                   DATE OF BIRTH:  1960-05-16   DATE OF ADMISSION:  02/07/2002  DATE OF DISCHARGE:  02/11/2002                                 DISCHARGE SUMMARY   CHIEF COMPLAINT AND PRESENT ILLNESS:  This was first admission to [facility] for this 50 year old African-American male, single,  voluntarily admitted.  Presented to mental health requesting help, going to  kill himself by overdosing on heroin and had thoughts of finding and buying  the heroin.  History of alcohol abuse and cocaine abuse.  [facility].  After being released, he relapsed,  triggered by grief over his grandmother's death.  Off medications for an  unclear period of time.  Initially irritable and guarded.  Generally poor  historian and turned his head and refused to speak.  Says he does not want  to be bothered.  He is HIV positive and he is going to have an appointment  for new medications for his HIV condition.   PAST PSYCHIATRIC HISTORY:  ADS January 17 through January 20.  First time at  [facility].  Had been at [facility], [place] and [place].   ALCOHOL/DRUG HISTORY:  Extensive use of alcohol, cocaine and heroin.  Declined to give many details.   PAST MEDICAL HISTORY:  AIDS in November of 2002.   MEDICATIONS:  Azithromycin, prophylaxis, fluconazole and Midrin.  Has taken  Zoloft and Risperdal but noncompliant.   PHYSICAL EXAMINATION:  Performed and failed to show any acute findings.   MENTAL STATUS EXAM:  Thin, disheveled, fully alert male with an irritable,  guarded affect.  Resistant to interview.  He does agree to come to the  treatment room but uncooperative and angry.  Does not  feel well enough and  insists on going back to bed.  Answers very few questions.  Mild pressure.  Affect guarded, irritable.  Mood depressed and irritable.  Thought processes  remarkable for auditory hallucinations, mumbling voices.  Still very guarded  about it.  Positive for suicidal ideation and paranoia.  Cognition well-  preserved.   ADMISSION DIAGNOSES:   AXIS I:  1. Schizoaffective disorder.  2. Polysubstance dependence.   AXIS II:  No diagnosis.   AXIS III:  Acquired immune deficiency syndrome.   AXIS IV:  Moderate.   AXIS V:  Global Assessment of Functioning upon admission 25; highest Global  Assessment of Functioning in the last year 64.   LABORATORY DATA:  Albumin 3.3, alkaline phosphatase 34.  Thyroid profile  within normal limits.  Electrolytes within normal limits.  White blood cells  9.2.  Drug screen negative for substances of abuse.   HOSPITAL COURSE:  He was admitted and started intensive individual and group  psychotherapy.  He was placed back on his Zoloft, then Risperdal.  He was  given some Librium 3 p.r.n. and Symmetrel 100 mg twice a day.  Risperdal was  increased to 0.25 mg twice a day and 0.5 mg at night.  Zoloft was  discontinued as well as his azithromycin.  He was very sedated on the  Risperdal so we decreased it back to 0.5 mg at bedtime.  As already stated,  initially very irritable, very angry.  He claimed that the voices started  getting better.  Wanted to be sure he was going to able to keep his  appointment at the infectious disease clinic, which made him very worried.  He complained of nausea and decreased appetite.  Felt that it has to do with  the medication.  We went ahead and decreased the Risperdal and held the  Zoloft.  On February 11, 2002, he was in full contact with reality.  He  denied any homicidal ideation, any suicidal ideation, any hallucinations.  Looking forward to being able to leave and go to the infectious disease   appointment to see what medications they want to give him for his HIV  condition.   DISCHARGE DIAGNOSES:   AXIS I:  1. Schizoaffective disorder.  2. Polysubstance dependence.   AXIS II:  No diagnosis.   AXIS III:  Acquired immune deficiency syndrome.   AXIS IV:  Moderate.   AXIS V:  Global Assessment of Functioning upon discharge 50.   DISCHARGE MEDICATIONS:  Risperdal 0.5 mg at bedtime.   FOLLOW UP:  [facility].                                               Geoffery Lyons, M.D.    IL/MEDQ  D:  03/12/2002  T:  03/13/2002  Job:  [id number]
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
## 2010-05-27 NOTE — Discharge Summary (Signed)
[name], [name] NO.:  [id number]   MEDICAL RECORD NO.:  [id number]         PATIENT TYPE:  BIPS   LOCATION:                                FACILITY:  [facility]   PHYSICIAN:  Jeanice Lim, M.D. DATE OF BIRTH:  1960/10/22   DATE OF ADMISSION:  03/05/2004  DATE OF DISCHARGE:  03/11/2004                                 DISCHARGE SUMMARY   IDENTIFYING DATA:  This is a 50 year old African-American male, well-known  due to multiple admissions.  History of depression, polysubstance abuse.  Had been discharged from [facility] approximately a month ago  and was to go to [place].  Instead, as in the past, unable to leave town  once he got money.  After staying in a shelter for a few nights, he decided  to use this on cocaine and alcohol, wanting to hurt himself, feeling he was  going to die anyway from his medical problems and presenting as he has  presented in the past, very weak, not having eaten for several days.  Likely  having worsening of depression related to rebound after the cocaine and  alcohol use and worsening of his medical condition and lack of compliance  with psychiatric medications.  The patient has a history of multiple  admissions to [facility] and [facility] in between [facility] admissions as well as other hospitals.   MEDICATIONS:  Had been noncompliant but recommended to take Zithromax,  Protonix, Kaletra, Epivir and Viread.  Had previously been on Zoloft,  Risperdal and Seroquel.   ALLERGIES:  SULFA, BACTRIM, DAPSONE and CLINDAMYCIN.   PHYSICAL EXAMINATION:  Somewhat unkempt, whitish lesions noted on the lips  and on the temporal region.  Weight 176 pounds.  Vital signs stable.   LABORATORY DATA:  Alcohol level was 130.  Drug screen positive for cocaine.  Hemoglobin and hematocrit 12.5 and 37.3 and TSH within normal limits at 2.8.   MENTAL STATUS EXAM:  Sleepy, middle-aged, unsteady, tired-appearing.   Speech  clear.  Very depressed.  Hopeless, helpless, worthless, unsteady gait,  feeling flat and no will to go on, might as well let him die because he is  going to die anyway.  Cognitively intact.  Somewhat manipulative.  Reports  depressive symptoms and his medical condition.  Tending to get a reaction  which is to attempt to help him more when it is unclear, due to past  pattern, whether he is wanting help and may have more difficulty giving up  the lifestyle of substance use than he will admit when he comes in reporting  depression.  The patient has a history of poor impulse control, has very  impaired judgment and insight and is clearly suicidal.   ADMISSION DIAGNOSES:   AXIS I:  1.  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  2.  Polysubstance abuse.  3.  Cocaine dependence.  4.  Alcohol dependence.   AXIS II:  Deferred.   AXIS III:  1.  AIDS.  2.  Human immunodeficiency virus positive.  3.  Low CD4 count.   AXIS IV:  Moderate to severe (problems related to social environment, lack  of support, medical problems and substance abuse exacerbating all of above).   AXIS V:  30/55-60.   HOSPITAL COURSE:  The patient was admitted and ordered routine p.r.n.  medications and underwent further monitoring and was monitored for safety  with suicide prevention.  Placed on [address].  Encouraged to participate in  individual, group and milieu therapy including substance abuse treatment and  work on a relapse prevention plan.  The patient was given supportive therapy  and encouraged daily to look at his lifestyle pattern and that he is likely  going to die fairly soon if he does not change things.  The patient, this  time as before, reported motivation to turn things around, to get his life  on track, to stop this because he could no longer keep feeling, getting to  the point where he is feeling that he wants to die.  The patient was   agreeable to make arrangements and talk with the family and telling them the  bus that he would be on and when he would get to [place].  He did, though,  turn down a ride directly to [place] from his preacher because he thought  that he would have to listen to too much preaching.  Instead, went to the  bus station, was to pick up his check and to get a haircut before he went on  the bus.  Therefore, prognosis is quite guarded in light of history of  noncompliance and inability to use self-control or control impulses related  to the severity of his addiction.   CONDITION ON DISCHARGE:  The patient was, however, discharged in improved  condition.  Mood was euthymic.  Affect bright.  The patient no longer was  feeling weak, felt stronger.  Was sleeping well, eating well and motivated  to be compliant with the aftercare plan, verbally promising to send a  postcard from [place] once he got there and thanking the staff.  He sent  thank you cards in the past often just prior to being readmitted.   DISCHARGE MEDICATIONS:  The patient was given medication education regarding  risk/benefit ratio and alternative treatments regarding medications and  discharged on:   1.  Seroquel 100 mg, 1-2 q.h.s.  2.  Wellbutrin XL 300 mg q.a.m.  3.  Cymbalta 30 mg q.a.m. and 6 p.m.  4.  Motrin 600 mg, q.6h. p.r.n. pain up to three times per 24 hours.  5.  Symmetrel 100 mg b.i.d. p.r.n. cravings.  6.  Ambien 10 mg q.h.s. p.r.n.  7.  Lamictal 25 mg, 1 at 8 p.m. for one week and then 2 q.h.s.  8.  Denavir cream every two hours x 2 days.  9.  Epivir 150 mg, 2 q.a.m.  10. Viread 300 mg q.a.m.  11. Valtrex 500 mg b.i.d.  12. Kaletra 200/50 mg, 2 two times a day.  13. Protonix 40 mg q.a.m.   The patient was given samples of Seroquel, Wellbutrin and Cymbalta as well  as Lamictal to assist the patient in every way possible.  Given an account voucher.  A great deal of time was spent by casemanagement working on  this  patient's disposition to try to assist him due to the severity of his  addiction, severity of his medical condition and his prognosis being so  poor.  The patient did appear to respond to this  attention and support and  reported emphatically that he would be on that bus and get to [facility] to be  with his family who could watch him, monitor him and help him get back on  track and healthy again, remaining abstinent from all substances of abuse.  The patient was discharged in improved condition with no dangerous ideation  or risk issues.  No withdrawal symptoms.  No side effects from medications.   DISCHARGE DIAGNOSES:   AXIS I:  1.  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  2.  Polysubstance abuse.  3.  Cocaine dependence.  4.  Alcohol dependence.   AXIS II:  Deferred.   AXIS III:  1.  AIDS.  2.  Human immunodeficiency virus positive.  3.  Low CD4 count.   AXIS IV:  Moderate to severe (problems related to social environment, lack  of support, medical problems and substance abuse exacerbating all of above).   AXIS V:  Global Assessment of Functioning on discharge 60.      JEM/MEDQ  D:  04/10/2004  T:  04/11/2004  Job:  [id number]

## 2010-08-21 ENCOUNTER — Emergency Department (HOSPITAL_COMMUNITY)
Admission: EM | Admit: 2010-08-21 | Discharge: 2010-08-21 | Discharge status: Against Medical Advice | Payer: Medicare Other | Attending: Emergency Medicine | Admitting: Emergency Medicine

## 2010-08-21 ENCOUNTER — Emergency Department (HOSPITAL_COMMUNITY): Payer: Medicare Other

## 2010-08-21 DIAGNOSIS — M7989 Other specified soft tissue disorders: Secondary | ICD-10-CM | POA: Insufficient documentation

## 2010-08-21 DIAGNOSIS — M25529 Pain in unspecified elbow: Secondary | ICD-10-CM | POA: Insufficient documentation

## 2010-09-29 LAB — I-STAT 8, (EC8 V) (CONVERTED LAB)
BUN: 13
Bicarbonate: 25.6 — ABNORMAL HIGH
Chloride: 105
HCT: 45
Hemoglobin: 15.3
Operator id: 282201
Sodium: 137
pCO2, Ven: 42.6 — ABNORMAL LOW

## 2010-09-29 LAB — HEPATIC FUNCTION PANEL
ALT: 17
Albumin: 3.9
Alkaline Phosphatase: 45
Total Bilirubin: 0.7
Total Protein: 7.1

## 2010-09-29 LAB — RAPID URINE DRUG SCREEN, HOSP PERFORMED
Cocaine: POSITIVE — AB
Tetrahydrocannabinol: POSITIVE — AB

## 2010-09-29 LAB — DIFFERENTIAL
Basophils Absolute: 0
Basophils Relative: 0
Eosinophils Relative: 1
Monocytes Absolute: 0.3
Monocytes Relative: 6
Neutro Abs: 2.8

## 2010-09-29 LAB — CBC
HCT: 38.9 — ABNORMAL LOW
Hemoglobin: 12.8 — ABNORMAL LOW
MCHC: 32.8
MCV: 87.8
RBC: 4.43
RDW: 14

## 2010-09-29 LAB — POCT I-STAT CREATININE: Creatinine, Ser: 1.4

## 2010-10-03 LAB — URINALYSIS, ROUTINE W REFLEX MICROSCOPIC
Bilirubin Urine: NEGATIVE
Glucose, UA: NEGATIVE
Ketones, ur: NEGATIVE
Protein, ur: NEGATIVE
pH: 5.5

## 2010-10-03 LAB — CULTURE, BLOOD (ROUTINE X 2): Culture: NO GROWTH

## 2010-10-03 LAB — CBC
HCT: 36.1 — ABNORMAL LOW
HCT: 37.8 — ABNORMAL LOW
Hemoglobin: 12.2 — ABNORMAL LOW
Hemoglobin: 13
MCHC: 32.8
MCHC: 33.6
MCHC: 33.9
MCV: 86.1
MCV: 86.2
MCV: 86.3
MCV: 87.3
Platelets: 189
Platelets: 193
Platelets: 259
Platelets: 277
RBC: 4.5
RBC: 4.5
RBC: 4.55
RBC: 4.77
RDW: 13.7
RDW: 13.9
RDW: 14.1
WBC: 3.4 — ABNORMAL LOW
WBC: 4.3
WBC: 4.5

## 2010-10-03 LAB — T-HELPER CELLS (CD4) COUNT (NOT AT ARMC)
CD4 % Helper T Cell: 19 — ABNORMAL LOW
CD4 T Cell Abs: 200 — ABNORMAL LOW
CD4 T Cell Abs: 380 — ABNORMAL LOW

## 2010-10-03 LAB — DIFFERENTIAL
Basophils Absolute: 0
Basophils Absolute: 0
Basophils Relative: 0
Lymphocytes Relative: 14
Lymphocytes Relative: 42
Lymphocytes Relative: 70 — ABNORMAL HIGH
Lymphs Abs: 0.6 — ABNORMAL LOW
Monocytes Relative: 13 — ABNORMAL HIGH
Monocytes Relative: 14 — ABNORMAL HIGH
Monocytes Relative: 15 — ABNORMAL HIGH
Neutro Abs: 1.4 — ABNORMAL LOW
Neutrophils Relative %: 42 — ABNORMAL LOW
Neutrophils Relative %: 73

## 2010-10-03 LAB — COMPREHENSIVE METABOLIC PANEL
ALT: 33
ALT: 49
AST: 39 — ABNORMAL HIGH
Alkaline Phosphatase: 35 — ABNORMAL LOW
Alkaline Phosphatase: 42
Alkaline Phosphatase: 49
BUN: 10
CO2: 25
CO2: 26
Chloride: 102
Creatinine, Ser: 1.3
GFR calc Af Amer: >60
GFR calc non Af Amer: >60
GFR calc non Af Amer: >60
Glucose, Bld: 102 — ABNORMAL HIGH
Glucose, Bld: 89
Potassium: 3.9
Potassium: 4.1
Potassium: 4.2
Sodium: 136
Sodium: 138
Total Bilirubin: 1.1
Total Protein: 6.2
Total Protein: 6.7

## 2010-10-03 LAB — RAPID URINE DRUG SCREEN, HOSP PERFORMED
Amphetamines: NOT DETECTED
Benzodiazepines: NOT DETECTED
Cocaine: POSITIVE — AB
Tetrahydrocannabinol: POSITIVE — AB

## 2010-10-03 LAB — BASIC METABOLIC PANEL
BUN: 6
BUN: 9
CO2: 27
Calcium: 8.4
Calcium: 8.9
Calcium: 8.9
Creatinine, Ser: 1.14
Creatinine, Ser: 1.22
GFR calc Af Amer: >60
GFR calc non Af Amer: >60
GFR calc non Af Amer: >60
Glucose, Bld: 111 — ABNORMAL HIGH
Sodium: 137

## 2010-10-03 LAB — INFLUENZA A+B VIRUS AG-DIRECT(RAPID)
Inflenza A Ag: NEGATIVE
Influenza B Ag: NEGATIVE

## 2010-10-03 LAB — ETHANOL: Alcohol, Ethyl (B): 133 — ABNORMAL HIGH

## 2010-10-03 LAB — EXPECTORATED SPUTUM ASSESSMENT W GRAM STAIN, RFLX TO RESP C

## 2010-10-03 LAB — P CARINII SMEAR DFA

## 2010-10-03 LAB — RAPID STREP SCREEN (MED CTR MEBANE ONLY): Streptococcus, Group A Screen (Direct): NEGATIVE

## 2010-10-10 LAB — DIFFERENTIAL
Basophils Absolute: 0.1
Basophils Relative: 1
Eosinophils Relative: 1
Monocytes Absolute: 0.4

## 2010-10-10 LAB — CBC
HCT: 37.6 — ABNORMAL LOW
Hemoglobin: 12.4 — ABNORMAL LOW
MCHC: 33
RDW: 14.7

## 2010-10-10 LAB — T-HELPER CELLS (CD4) COUNT (NOT AT ARMC)
CD4 % Helper T Cell: 22 — ABNORMAL LOW
CD4 T Cell Abs: 420

## 2010-10-10 LAB — POCT I-STAT, CHEM 8
Calcium, Ion: 1.12
Glucose, Bld: 69 — ABNORMAL LOW
HCT: 41
Hemoglobin: 13.9

## 2010-10-10 LAB — HEPATIC FUNCTION PANEL
AST: 26
Albumin: 3.7
Bilirubin, Direct: <0.1

## 2010-10-11 LAB — URINALYSIS, ROUTINE W REFLEX MICROSCOPIC
Bilirubin Urine: NEGATIVE
Glucose, UA: NEGATIVE
Hgb urine dipstick: NEGATIVE
Ketones, ur: 15 — AB
Nitrite: NEGATIVE
Protein, ur: NEGATIVE
Specific Gravity, Urine: 1.024
Urobilinogen, UA: 0.2
pH: 5.5

## 2010-10-11 LAB — POCT I-STAT, CHEM 8
BUN: 18
Chloride: 106
Creatinine, Ser: 1.4
Potassium: 4.2
Sodium: 138

## 2010-10-11 LAB — DIFFERENTIAL
Basophils Absolute: 0
Eosinophils Relative: 2
Lymphocytes Relative: 40
Lymphs Abs: 1.6
Neutro Abs: 1.8
Neutrophils Relative %: 47

## 2010-10-11 LAB — RAPID URINE DRUG SCREEN, HOSP PERFORMED
Benzodiazepines: POSITIVE — AB
Tetrahydrocannabinol: NOT DETECTED

## 2010-10-11 LAB — CBC
HCT: 42.2
Platelets: 262
RDW: 14.6
WBC: 3.9 — ABNORMAL LOW

## 2010-10-11 LAB — ETHANOL: Alcohol, Ethyl (B): 5

## 2010-11-24 ENCOUNTER — Other Ambulatory Visit: Payer: Self-pay | Admitting: Internal Medicine

## 2010-11-24 ENCOUNTER — Ambulatory Visit: Payer: Medicare Other | Admitting: Internal Medicine

## 2010-11-24 DIAGNOSIS — Z113 Encounter for screening for infections with a predominantly sexual mode of transmission: Secondary | ICD-10-CM

## 2010-11-24 DIAGNOSIS — Z20828 Contact with and (suspected) exposure to other viral communicable diseases: Secondary | ICD-10-CM

## 2010-11-24 DIAGNOSIS — B20 Human immunodeficiency virus [HIV] disease: Secondary | ICD-10-CM

## 2010-11-24 DIAGNOSIS — Z609 Problem related to social environment, unspecified: Secondary | ICD-10-CM

## 2010-11-24 DIAGNOSIS — Z79899 Other long term (current) drug therapy: Secondary | ICD-10-CM

## 2010-12-12 ENCOUNTER — Other Ambulatory Visit: Payer: Medicare Other

## 2010-12-15 ENCOUNTER — Ambulatory Visit: Payer: Medicare Other | Admitting: Internal Medicine

## 2010-12-26 ENCOUNTER — Ambulatory Visit: Payer: Medicare Other | Admitting: Internal Medicine

## 2010-12-29 ENCOUNTER — Telehealth: Payer: Self-pay | Admitting: *Deleted

## 2010-12-29 NOTE — Telephone Encounter (Signed)
He is coming in tomorrow for labs. Asked that he make a doctor appt when he is here for about 2 weeks from now. Stated he would

## 2010-12-29 NOTE — Telephone Encounter (Signed)
LEFT MESSAGE FOR PATIENT TO CALL AND RESCHEDULE APPOINTMENT ASAP. 

## 2010-12-30 ENCOUNTER — Other Ambulatory Visit: Payer: Medicare Other

## 2011-01-19 ENCOUNTER — Other Ambulatory Visit: Payer: Self-pay | Admitting: Licensed Clinical Social Worker

## 2011-01-19 ENCOUNTER — Telehealth: Payer: Self-pay | Admitting: Licensed Clinical Social Worker

## 2011-01-19 DIAGNOSIS — B20 Human immunodeficiency virus [HIV] disease: Secondary | ICD-10-CM

## 2011-01-19 MED ORDER — LOPINAVIR-RITONAVIR 200-50 MG PO TABS: 2.0000 | ORAL_TABLET | Freq: Two times a day (BID) | ORAL | Status: DC

## 2011-01-19 MED ORDER — EMTRICITABINE-TENOFOVIR DF 200-300 MG PO TABS: 1.0000 | ORAL_TABLET | Freq: Every day | ORAL | Status: DC

## 2011-01-19 NOTE — Telephone Encounter (Signed)
Patient called to reschedule for labs and office visit with Dr. Drue Second. I will call in his rx to [facility] with no refills until he keeps his appointment. He states he only has 6 pills left.

## 2011-01-23 ENCOUNTER — Other Ambulatory Visit: Payer: Medicare Other

## 2011-01-26 ENCOUNTER — Telehealth: Payer: Self-pay | Admitting: Licensed Clinical Social Worker

## 2011-01-26 DIAGNOSIS — B20 Human immunodeficiency virus [HIV] disease: Secondary | ICD-10-CM

## 2011-01-26 MED ORDER — EMTRICITABINE-TENOFOVIR DF 200-300 MG PO TABS: 1.0000 | ORAL_TABLET | Freq: Every day | ORAL | Status: DC

## 2011-01-26 MED ORDER — LOPINAVIR-RITONAVIR 200-50 MG PO TABS: 2.0000 | ORAL_TABLET | Freq: Two times a day (BID) | ORAL | Status: DC

## 2011-01-26 NOTE — Telephone Encounter (Signed)
Called in refill to [facility]

## 2011-01-27 ENCOUNTER — Other Ambulatory Visit: Payer: Medicare Other

## 2011-01-30 ENCOUNTER — Other Ambulatory Visit: Payer: Medicare Other

## 2011-02-16 ENCOUNTER — Ambulatory Visit: Payer: Medicare Other | Admitting: Internal Medicine

## 2011-04-03 ENCOUNTER — Encounter (HOSPITAL_COMMUNITY): Payer: Self-pay | Admitting: *Deleted

## 2011-04-03 ENCOUNTER — Emergency Department (HOSPITAL_COMMUNITY)
Admission: EM | Admit: 2011-04-03 | Discharge: 2011-04-03 | Disposition: A | Discharge status: Home / Self Care | Payer: Medicare Other | Attending: Emergency Medicine | Admitting: Emergency Medicine

## 2011-04-03 DIAGNOSIS — Z21 Asymptomatic human immunodeficiency virus [HIV] infection status: Secondary | ICD-10-CM | POA: Insufficient documentation

## 2011-04-03 DIAGNOSIS — A630 Anogenital (venereal) warts: Secondary | ICD-10-CM | POA: Insufficient documentation

## 2011-04-03 DIAGNOSIS — K625 Hemorrhage of anus and rectum: Secondary | ICD-10-CM | POA: Insufficient documentation

## 2011-04-03 HISTORY — DX: Human immunodeficiency virus (HIV) disease: B20

## 2011-04-03 HISTORY — DX: Asymptomatic human immunodeficiency virus (hiv) infection status: Z21

## 2011-04-03 LAB — POCT I-STAT, CHEM 8
Creatinine, Ser: 1.2 mg/dL (ref 0.50–1.35)
Hemoglobin: 15.6 g/dL (ref 13.0–17.0)
Potassium: 4.3 mEq/L (ref 3.5–5.1)
Sodium: 139 mEq/L (ref 135–145)
TCO2: 27 mmol/L (ref 0–100)

## 2011-04-03 LAB — CBC
Hemoglobin: 13.1 g/dL (ref 13.0–17.0)
MCH: 28.4 pg (ref 26.0–34.0)
MCHC: 32.9 g/dL (ref 30.0–36.0)
Platelets: 233 10*3/uL (ref 150–400)
RBC: 4.62 MIL/uL (ref 4.22–5.81)

## 2011-04-03 LAB — DIFFERENTIAL
Basophils Relative: 0 % (ref 0–1)
Eosinophils Absolute: 0 10*3/uL (ref 0.0–0.7)
Lymphs Abs: 1.7 10*3/uL (ref 0.7–4.0)
Neutro Abs: 2.9 10*3/uL (ref 1.7–7.7)
Neutrophils Relative %: 58 % (ref 43–77)

## 2011-04-03 MED ORDER — OXYCODONE-ACETAMINOPHEN 5-325 MG PO TABS
ORAL_TABLET | ORAL | Status: AC
  Filled 2011-04-03: qty 1

## 2011-04-03 MED ORDER — TRAMADOL HCL 50 MG PO TABS: 50.0000 mg | ORAL_TABLET | Freq: Four times a day (QID) | ORAL | Status: AC | PRN

## 2011-04-03 MED ORDER — DOCUSATE SODIUM 100 MG PO CAPS: 100.0000 mg | ORAL_CAPSULE | Freq: Two times a day (BID) | ORAL | Status: AC

## 2011-04-03 MED ORDER — OXYCODONE-ACETAMINOPHEN 5-325 MG PO TABS
1.0000 | ORAL_TABLET | Freq: Once | ORAL | Status: AC
  Administered 2011-04-03: 1 via ORAL

## 2011-04-03 NOTE — ED Provider Notes (Signed)
Patient screened in triage. He has very large anal warts present with significantly inflammed tissue that is perianal and either hemorrhoid or bleeding wart. Bleeding has been significant and constant, currently continues to bleed.  Rodena Medin, PA-C 04/03/11 1412

## 2011-04-03 NOTE — ED Notes (Signed)
Pt reports "hemorrhoid that busted this am." Uncontrolled bleeding, has "soaked 5 pair of underwear already." Reports pain 10/10.

## 2011-04-03 NOTE — Discharge Instructions (Signed)
Genital Warts Genital warts are a sexually transmitted infection. They may appear as small bumps on the tissues of the genital area. CAUSES  Genital warts are caused by a virus called human papillomavirus (HPV). HPV is the most common sexually transmitted disease (STD) and infection of the sex organs. This infection is spread by having unprotected sex with an infected person. It can be spread by vaginal, anal, and oral sex. Many people do not know they are infected. They may be infected for years without problems. However, even if they do not have problems, they can unknowingly pass the infection to their sexual partners. SYMPTOMS   Itching and irritation in the genital area.   Warts that bleed.   Painful sexual intercourse.  DIAGNOSIS  Warts are usually recognized with the naked eye on the vagina, vulva, perineum, anus, and rectum. Certain tests can also diagnose genital warts, such as:  A Pap test.   A tissue sample (biopsy) exam.   Colposcopy. A magnifying tool is used to examine the vagina and cervix. The HPV cells will change color when certain solutions are used.  TREATMENT  Warts can be removed by:  Applying certain chemicals, such as cantharidin or podophyllin.   Liquid nitrogen freezing (cryotherapy).   Immunotherapy with candida or trichophyton injections.   Laser treatment.   Burning with an electrified probe (electrocautery).   Interferon injections.   Surgery.  PREVENTION  HPV vaccination can help prevent HPV infections that cause genital warts and that cause cancer of the cervix. It is recommended that the vaccination be given to people between the ages 9 to 26 years old. The vaccine might not work as well or might not work at all if you already have HPV. It should not be given to pregnant women. HOME CARE INSTRUCTIONS   It is important to follow your caregiver's instructions. The warts will not go away without treatment. Repeat treatments are often needed to get  rid of warts. Even after it appears that the warts are gone, the normal tissue underneath often remains infected.   Do not try to treat genital warts with medicine used to treat hand warts. This type of medicine is strong and can burn the skin in the genital area, causing more damage.   Tell your past and current sexual partner(s) that you have genital warts. They may be infected also and need treatment.   Avoid sexual contact while being treated.   Do not touch or scratch the warts. The infection may spread to other parts of your body.   Women with genital warts should have a cervical cancer check (Pap test) at least once a year. This type of cancer is slow-growing and can be cured if found early. Chances of developing cervical cancer are increased with HPV.   Inform your obstetrician about your warts in the event of pregnancy. This virus can be passed to the baby's respiratory tract. Discuss this with your caregiver.   Use a condom during sexual intercourse. Following treatment, the use of condoms will help prevent reinfection.   Ask your caregiver about using over-the-counter anti-itch creams.  SEEK MEDICAL CARE IF:   Your treated skin becomes red, swollen, or painful.   You have a fever.   You feel generally ill.   You feel little lumps in and around your genital area.   You are bleeding or have painful sexual intercourse.  MAKE SURE YOU:   Understand these instructions.   Will watch your condition.   Will   get help right away if you are not doing well or get worse.  Document Released: 12/24/1999 Document Revised: 12/15/2010 Document Reviewed: 07/04/2010 [employer] Patient Information 2012 [employer]. 

## 2011-04-03 NOTE — ED Provider Notes (Signed)
History     CSN: [id number]  Arrival date & time 04/03/11  1255   First MD Initiated Contact with Patient 04/03/11 1355      Chief Complaint  Patient presents with  . Hemorrhoids  . Rectal Bleeding    Patient is a 51 y.o. male presenting with hematochezia. The history is provided by the patient.  Rectal Bleeding  The current episode started today. The onset was sudden. The problem occurs continuously. The pain is moderate. The stool is described as hard. There was no prior successful therapy. Associated symptoms include hemorrhoids.  Pt denies vomiiting, or diarrhea.  He has never seen anyone for this before.  He states he has had hemorrhoids for a while but has not had bleeding like this before.  He has pain in the anal area and feels lightheaded.  Past Medical History  Diagnosis Date  . HIV (human immunodeficiency virus infection)   . Hemorrhoids     Past Surgical History  Procedure Date  . Tendon repair   . Hernia repair     No family history on file.  History  Substance Use Topics  . Smoking status: Current Everyday Smoker  . Smokeless tobacco: Not on file  . Alcohol Use: Yes     6-12pk beer almost everyday      Review of Systems  Gastrointestinal: Positive for hematochezia and hemorrhoids.  All other systems reviewed and are negative.    Allergies  Shellfish allergy and Sulfonamide derivatives  Home Medications   Current Outpatient Rx  Name Route Sig Dispense Refill  . BUPROPION HCL ER (XL) 300 MG PO TB24 Oral Take 300 mg by mouth daily.    Marland Kitchen EMTRICITABINE-TENOFOVIR 200-300 MG PO TABS Oral Take 1 tablet by mouth daily. 30 tablet 0  . HYDROXYZINE PAMOATE 25 MG PO CAPS Oral Take 50 mg by mouth at bedtime as needed.    . IBUPROFEN 800 MG PO TABS Oral Take 800 mg by mouth every 8 (eight) hours as needed. For pain    . LOPINAVIR-RITONAVIR 200-50 MG PO TABS Oral Take 2 tablets by mouth 2 (two) times daily. 120 tablet 0    BP 151/98  Pulse 86  Temp(Src)  97.3 F (36.3 C) (Oral)  Resp 20  SpO2 100%  Physical Exam  Nursing note and vitals reviewed. Constitutional: He appears well-developed and well-nourished. No distress.  HENT:  Head: Normocephalic and atraumatic.  Right Ear: External ear normal.  Left Ear: External ear normal.  Eyes: Conjunctivae are normal. Right eye exhibits no discharge. Left eye exhibits no discharge. No scleral icterus.  Neck: Neck supple. No tracheal deviation present.  Cardiovascular: Normal rate, regular rhythm and intact distal pulses.   Pulmonary/Chest: Effort normal and breath sounds normal. No stridor. No respiratory distress. He has no wheezes. He has no rales.  Abdominal: Soft. Bowel sounds are normal. He exhibits no distension. There is no tenderness. There is no rebound and no guarding.  Genitourinary: Rectal exam shows mass and tenderness. Rectal exam shows no external hemorrhoid and no internal hemorrhoid.       Numerous large anal and perianal condyloma, friable with bleeding  Musculoskeletal: He exhibits no edema and no tenderness.  Neurological: He is alert. He has normal strength. No sensory deficit. Cranial nerve deficit:  no gross defecits noted. He exhibits normal muscle tone. He displays no seizure activity. Coordination normal.  Skin: Skin is warm and dry. No rash noted.  Psychiatric: He has a normal mood and affect.  ED Course  Procedures (including critical care time)   Labs Reviewed  CBC  DIFFERENTIAL  POCT I-STAT, CHEM 8   No results found.    MDM  Pt has several large condyloma.  I suspect those are causing his bleeding.  Pt is hemodynamically stable without signs of anemia.  Will attempt to arrange close outpt follow up with GI for possible treatment of the anal lesions.        Celene Kras, MD 04/04/11 [phone]

## 2011-04-03 NOTE — ED Provider Notes (Signed)
Medical screening examination/treatment/procedure(s) were performed by non-physician practitioner and as supervising physician I was immediately available for consultation/collaboration.  Glanda Spanbauer, MD 04/03/11 1518 

## 2011-04-24 ENCOUNTER — Ambulatory Visit (INDEPENDENT_AMBULATORY_CARE_PROVIDER_SITE_OTHER): Payer: Medicare Other | Admitting: Surgery

## 2011-05-08 ENCOUNTER — Other Ambulatory Visit: Payer: Medicare Other

## 2011-05-08 ENCOUNTER — Other Ambulatory Visit: Payer: Self-pay | Admitting: Licensed Clinical Social Worker

## 2011-05-08 ENCOUNTER — Other Ambulatory Visit (HOSPITAL_COMMUNITY)
Admission: RE | Admit: 2011-05-08 | Discharge: 2011-05-08 | Disposition: A | Discharge status: Home / Self Care | Payer: Medicare Other | Source: Ambulatory Visit | Attending: Internal Medicine | Admitting: Internal Medicine

## 2011-05-08 DIAGNOSIS — B20 Human immunodeficiency virus [HIV] disease: Secondary | ICD-10-CM

## 2011-05-08 DIAGNOSIS — Z79899 Other long term (current) drug therapy: Secondary | ICD-10-CM

## 2011-05-08 DIAGNOSIS — Z113 Encounter for screening for infections with a predominantly sexual mode of transmission: Secondary | ICD-10-CM

## 2011-05-08 LAB — LIPID PANEL
HDL: 91 mg/dL (ref >39)
Total CHOL/HDL Ratio: 2.2 Ratio
VLDL: 17 mg/dL (ref 0–40)

## 2011-05-08 LAB — COMPLETE METABOLIC PANEL WITH GFR
ALT: 25 U/L (ref 0–53)
AST: 41 U/L — ABNORMAL HIGH (ref 0–37)
Albumin: 4.4 g/dL (ref 3.5–5.2)
Alkaline Phosphatase: 40 U/L (ref 39–117)
GFR, Est Non African American: 69 mL/min
Glucose, Bld: 81 mg/dL (ref 70–99)
Potassium: 4 mEq/L (ref 3.5–5.3)
Sodium: 139 mEq/L (ref 135–145)
Total Bilirubin: 0.5 mg/dL (ref 0.3–1.2)
Total Protein: 7.2 g/dL (ref 6.0–8.3)

## 2011-05-08 LAB — CBC WITH DIFFERENTIAL/PLATELET
Basophils Absolute: 0 10*3/uL (ref 0.0–0.1)
Eosinophils Absolute: 0.1 10*3/uL (ref 0.0–0.7)
Eosinophils Relative: 2 % (ref 0–5)
Lymphocytes Relative: 30 % (ref 12–46)
MCV: 88.1 fL (ref 78.0–100.0)
Neutrophils Relative %: 57 % (ref 43–77)
Platelets: 288 10*3/uL (ref 150–400)
RBC: 4.7 MIL/uL (ref 4.22–5.81)
RDW: 14.6 % (ref 11.5–15.5)
WBC: 6.2 10*3/uL (ref 4.0–10.5)

## 2011-05-08 LAB — URINALYSIS, ROUTINE W REFLEX MICROSCOPIC
Bilirubin Urine: NEGATIVE
Leukocytes, UA: NEGATIVE
Nitrite: NEGATIVE
Protein, ur: NEGATIVE mg/dL
Specific Gravity, Urine: 1.006 (ref 1.005–1.030)
Urobilinogen, UA: 0.2 mg/dL (ref 0.0–1.0)

## 2011-05-08 LAB — RPR

## 2011-05-08 MED ORDER — LOPINAVIR-RITONAVIR 200-50 MG PO TABS: 2.0000 | ORAL_TABLET | Freq: Two times a day (BID) | ORAL | Status: DC

## 2011-05-08 MED ORDER — EMTRICITABINE-TENOFOVIR DF 200-300 MG PO TABS: 1.0000 | ORAL_TABLET | Freq: Every day | ORAL | Status: DC

## 2011-05-09 LAB — HIV-1 RNA QUANT-NO REFLEX-BLD: HIV-1 RNA Quant, Log: 1.53 {Log} — ABNORMAL HIGH (ref <1.30)

## 2011-05-22 ENCOUNTER — Encounter (INDEPENDENT_AMBULATORY_CARE_PROVIDER_SITE_OTHER): Payer: Self-pay | Admitting: Surgery

## 2011-05-22 ENCOUNTER — Ambulatory Visit (INDEPENDENT_AMBULATORY_CARE_PROVIDER_SITE_OTHER): Payer: Medicare Other | Admitting: Surgery

## 2011-05-22 VITALS — BP 120/79 | HR 78 | Temp 97.6°F | Ht 72.0 in | Wt 157.4 lb

## 2011-05-22 DIAGNOSIS — A630 Anogenital (venereal) warts: Secondary | ICD-10-CM

## 2011-05-22 HISTORY — DX: Anogenital (venereal) warts: A63.0

## 2011-05-22 NOTE — Progress Notes (Signed)
Patient ID: [name], male   DOB: September 12, 1960, 51 y.o.   MRN: [id number]  Chief Complaint  Patient presents with  . Pre-op Exam    eval anal warts    HPI [name] is a 51 y.o. male. 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 He is now having discomfort and some bleeding. He denies any incontinence. He has had no previous surgery on the anal area. HPI  Past Medical History  Diagnosis Date  . HIV (human immunodeficiency virus infection)   . Hemorrhoids     Past Surgical History  Procedure Date  . Tendon repair   . Hernia repair     Family History  Problem Relation Age of Onset  . Cancer Mother     brain  . Cancer Sister     breast    Social History History  Substance Use Topics  . Smoking status: Current Everyday Smoker -- 1.0 packs/day    Types: Cigarettes  . Smokeless tobacco: Not on file  . Alcohol Use: Yes     6-12pk beer almost everyday    Allergies  Allergen Reactions  . Shellfish Allergy   . Sulfa Antibiotics Hives and Swelling  . Sulfonamide Derivatives     Current Outpatient Prescriptions  Medication Sig Dispense Refill  . emtricitabine-tenofovir (TRUVADA) 200-300 MG per tablet Take 1 tablet by mouth daily.  30 tablet  3  . lopinavir-ritonavir (KALETRA) 200-50 MG per tablet Take 2 tablets by mouth 2 (two) times daily.  120 tablet  3  . buPROPion (WELLBUTRIN XL) 300 MG 24 hr tablet Take 300 mg by mouth daily.      . hydrOXYzine (VISTARIL) 25 MG capsule Take 50 mg by mouth at bedtime as needed.      Marland Kitchen ibuprofen (ADVIL,MOTRIN) 800 MG tablet Take 800 mg by mouth every 8 (eight) hours as needed. For pain        Review of Systems Review of Systems  Constitutional: Negative for fever, chills and unexpected weight change.  HENT: Negative for hearing loss, congestion, sore throat, trouble swallowing and voice change.   Eyes: Negative for visual disturbance.  Respiratory:  Negative for cough and wheezing.   Cardiovascular: Negative for chest pain, palpitations and leg swelling.  Gastrointestinal: Positive for blood in stool and rectal pain. Negative for nausea, vomiting, abdominal pain, diarrhea, constipation, abdominal distention and anal bleeding.  Genitourinary: Negative for hematuria and difficulty urinating.  Musculoskeletal: Negative for arthralgias.  Skin: Negative for rash and wound.  Neurological: Negative for seizures, syncope, weakness and headaches.  Hematological: Negative for adenopathy. Does not bruise/bleed easily.  Psychiatric/Behavioral: Negative for confusion.    Blood pressure 120/79, pulse 78, temperature 97.6 F (36.4 C), temperature source Temporal, height 6' (1.829 m), weight 157 lb 6.4 oz (71.396 kg), SpO2 97.00%.  Physical Exam Physical Exam  Constitutional: He is oriented to person, place, and time. He appears well-developed and well-nourished. No distress.  HENT:  Head: Normocephalic and atraumatic.  Right Ear: External ear normal.  Left Ear: External ear normal.  Nose: Nose normal.  Mouth/Throat: Oropharynx is clear and moist. No oropharyngeal exudate.  Eyes: Conjunctivae are normal. Pupils are equal, round, and reactive to light. Right eye exhibits no discharge. Left eye exhibits no discharge. No scleral icterus.  Neck: Normal range of motion. Neck supple. No tracheal deviation present. No thyromegaly present.  Cardiovascular: Normal rate, regular rhythm, normal heart sounds and intact  distal pulses.   No murmur heard. Pulmonary/Chest: Effort normal and breath sounds normal. No respiratory distress. He has no wheezes. He has no rales.  Abdominal: Soft. Bowel sounds are normal. He exhibits no distension. There is no tenderness. There is no rebound.  Genitourinary:       On rectal examination, he has circumferential, large, anal condyloma  Musculoskeletal: Normal range of motion. He exhibits no edema and no tenderness.    Lymphadenopathy:    He has no cervical adenopathy.  Neurological: He is alert and oriented to person, place, and time.  Skin: Skin is warm and dry. He is not diaphoretic. No erythema. No pallor.  Psychiatric: His behavior is normal. Judgment normal.    Data Reviewed   Assessment    Anal condyloma    Plan    Excision in the operating room is recommended given the size and to rule out malignancy as well as control his symptoms. I discussed this with him in detail. I discussed the risks of surgery which includes but is not limited to bleeding, infection, scarring, recurrence, need for further surgery, incontinence, et Karie Soda. He understands and wishes to proceed. Likelihood of success is good       Jarnell Cordaro A 05/22/2011, 10:33 AM

## 2011-05-26 ENCOUNTER — Encounter (HOSPITAL_BASED_OUTPATIENT_CLINIC_OR_DEPARTMENT_OTHER): Payer: Self-pay | Admitting: *Deleted

## 2011-05-26 NOTE — Progress Notes (Signed)
No labs needed Pt told to not drink a lot of beer prior to surg-hydrate well-clear liq 8 hr preop

## 2011-05-30 ENCOUNTER — Ambulatory Visit: Payer: Medicare Other | Admitting: Internal Medicine

## 2011-05-31 ENCOUNTER — Ambulatory Visit (HOSPITAL_BASED_OUTPATIENT_CLINIC_OR_DEPARTMENT_OTHER)
Admission: RE | Admit: 2011-05-31 | Discharge: 2011-05-31 | Disposition: A | Discharge status: Home / Self Care | Payer: Medicare Other | Source: Ambulatory Visit | Attending: Surgery | Admitting: Surgery

## 2011-05-31 ENCOUNTER — Encounter (HOSPITAL_BASED_OUTPATIENT_CLINIC_OR_DEPARTMENT_OTHER): Payer: Self-pay | Admitting: Certified Registered"

## 2011-05-31 ENCOUNTER — Encounter (HOSPITAL_BASED_OUTPATIENT_CLINIC_OR_DEPARTMENT_OTHER)
Admission: RE | Disposition: A | Discharge status: Home / Self Care | Payer: Self-pay | Source: Ambulatory Visit | Attending: Surgery

## 2011-05-31 ENCOUNTER — Encounter (HOSPITAL_BASED_OUTPATIENT_CLINIC_OR_DEPARTMENT_OTHER): Payer: Self-pay | Admitting: *Deleted

## 2011-05-31 SURGERY — FULGURATION, WART, ANUS
Anesthesia: General

## 2011-05-31 MED ORDER — LACTATED RINGERS IV SOLN: INTRAVENOUS | Status: DC

## 2011-05-31 MED ORDER — CEFAZOLIN SODIUM 1-5 GM-% IV SOLN: 1.0000 g | INTRAVENOUS | Status: DC

## 2011-05-31 SURGICAL SUPPLY — 30 items
BLADE SURG 15 STRL LF DISP TIS (BLADE) IMPLANT
BLADE SURG 15 STRL SS (BLADE)
CLEANER CAUTERY TIP 5X5 PAD (MISCELLANEOUS) IMPLANT
CLOTH BEACON ORANGE TIMEOUT ST (SAFETY) IMPLANT
COVER MAYO STAND STRL (DRAPES) IMPLANT
COVER TABLE BACK 60X90 (DRAPES) IMPLANT
DECANTER SPIKE VIAL GLASS SM (MISCELLANEOUS) IMPLANT
DRAPE UTILITY XL STRL (DRAPES) IMPLANT
ELECT NEEDLE TIP 2.8 STRL (NEEDLE) IMPLANT
ELECT REM PT RETURN 9FT ADLT (ELECTROSURGICAL)
ELECTRODE REM PT RTRN 9FT ADLT (ELECTROSURGICAL) IMPLANT
GAUZE SPONGE 4X4 12PLY STRL LF (GAUZE/BANDAGES/DRESSINGS) IMPLANT
GLOVE BIO SURGEON STRL SZ8 (GLOVE) IMPLANT
GLOVE BIOGEL PI IND STRL 7.5 (GLOVE) IMPLANT
GLOVE BIOGEL PI INDICATOR 7.5 (GLOVE)
GOWN PREVENTION PLUS XLARGE (GOWN DISPOSABLE) IMPLANT
NEEDLE HYPO 22GX1.5 SAFETY (NEEDLE) IMPLANT
PACK BASIN DAY SURGERY FS (CUSTOM PROCEDURE TRAY) IMPLANT
PAD CLEANER CAUTERY TIP 5X5 (MISCELLANEOUS)
PENCIL BUTTON HOLSTER BLD 10FT (ELECTRODE) IMPLANT
SHEET MEDIUM DRAPE 40X70 STRL (DRAPES) IMPLANT
SURGILUBE 2OZ TUBE FLIPTOP (MISCELLANEOUS) IMPLANT
SYR CONTROL 10ML LL (SYRINGE) IMPLANT
TOWEL OR 17X24 6PK STRL BLUE (TOWEL DISPOSABLE) IMPLANT
TOWEL OR NON WOVEN STRL DISP B (DISPOSABLE) IMPLANT
TRAY DSU PREP LF (CUSTOM PROCEDURE TRAY) IMPLANT
TRAY PROCTOSCOPIC FIBER OPTIC (SET/KITS/TRAYS/PACK) IMPLANT
TUBING STERILE (MISCELLANEOUS) IMPLANT
UNDERPAD 30X30 INCONTINENT (UNDERPADS AND DIAPERS) IMPLANT
WATER STERILE IRR 1000ML POUR (IV SOLUTION) IMPLANT

## 2011-05-31 NOTE — Progress Notes (Signed)
Case cancelled by Dr Gelene Mink and Dr Magnus Ivan since patient stated he "had a beer on the way over" to the surgery center.

## 2011-06-01 ENCOUNTER — Encounter (HOSPITAL_BASED_OUTPATIENT_CLINIC_OR_DEPARTMENT_OTHER): Payer: Self-pay | Admitting: Surgery

## 2011-06-06 ENCOUNTER — Ambulatory Visit (INDEPENDENT_AMBULATORY_CARE_PROVIDER_SITE_OTHER): Payer: Medicare Other | Admitting: Internal Medicine

## 2011-06-06 ENCOUNTER — Encounter: Payer: Self-pay | Admitting: Internal Medicine

## 2011-06-06 VITALS — BP 141/97 | HR 97 | Temp 98.3°F | Wt 155.0 lb

## 2011-06-06 DIAGNOSIS — Z21 Asymptomatic human immunodeficiency virus [HIV] infection status: Secondary | ICD-10-CM

## 2011-06-06 DIAGNOSIS — B20 Human immunodeficiency virus [HIV] disease: Secondary | ICD-10-CM

## 2011-06-06 MED ORDER — EMTRICITABINE-TENOFOVIR DF 200-300 MG PO TABS: 1.0000 | ORAL_TABLET | Freq: Every day | ORAL | Status: DC

## 2011-06-06 MED ORDER — LOPINAVIR-RITONAVIR 200-50 MG PO TABS: 2.0000 | ORAL_TABLET | Freq: Two times a day (BID) | ORAL | Status: DC

## 2011-06-16 ENCOUNTER — Telehealth: Payer: Self-pay | Admitting: *Deleted

## 2011-06-16 NOTE — Telephone Encounter (Signed)
Received phone call that the patient was admitted to [facility] in [place] from [facility] [facility]. Just an [place] courtesy call.

## 2011-06-22 ENCOUNTER — Telehealth: Payer: Self-pay | Admitting: *Deleted

## 2011-06-22 NOTE — Telephone Encounter (Signed)
[name], a therapist at [facility] called asking for an appt for this man. This is a mental health facility she states. He was just here 06/06/11 and has labs in August & md visit 09/20/11. She has connected him with [facility] in [place] for mental Health. She then said he did not need to be seen now as he has appts scheduled

## 2011-07-04 NOTE — Progress Notes (Signed)
Was here 5/13 No labs needed

## 2011-07-05 NOTE — H&P (Signed)
Patient ID: [name], male DOB: 10-13-1960, 51 y.o. MRN: [id number]  Chief Complaint   Patient presents with   .  Pre-op Exam     eval anal warts    HPI  [name] is a 51 y.o. male. 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 He is now having discomfort and some bleeding. He denies any incontinence. He has had no previous surgery on the anal area. Initial surgery cancelled when patient drank preop. Past Medical History   Diagnosis  Date   .  HIV (human immunodeficiency virus infection)    .  Hemorrhoids     Past Surgical History   Procedure  Date   .  Tendon repair    .  Hernia repair     Family History   Problem  Relation  Age of Onset   .  Cancer  Mother       brain    .  Cancer  Sister       breast    Social History  History   Substance Use Topics   .  Smoking status:  Current Everyday Smoker -- 1.0 packs/day     Types:  Cigarettes   .  Smokeless tobacco:  Not on file   .  Alcohol Use:  Yes      6-12pk beer almost everyday    Allergies   Allergen  Reactions   .  Shellfish Allergy    .  Sulfa Antibiotics  Hives and Swelling   .  Sulfonamide Derivatives     Current Outpatient Prescriptions   Medication  Sig  Dispense  Refill   .  emtricitabine-tenofovir (TRUVADA) 200-300 MG per tablet  Take 1 tablet by mouth daily.  30 tablet  3   .  lopinavir-ritonavir (KALETRA) 200-50 MG per tablet  Take 2 tablets by mouth 2 (two) times daily.  120 tablet  3   .  buPROPion (WELLBUTRIN XL) 300 MG 24 hr tablet  Take 300 mg by mouth daily.     .  hydrOXYzine (VISTARIL) 25 MG capsule  Take 50 mg by mouth at bedtime as needed.     Marland Kitchen  ibuprofen (ADVIL,MOTRIN) 800 MG tablet  Take 800 mg by mouth every 8 (eight) hours as needed. For pain      Review of Systems  Review of Systems  Constitutional: Negative for fever, chills and unexpected weight change.  HENT: Negative for hearing loss, congestion, sore  throat, trouble swallowing and voice change.  Eyes: Negative for visual disturbance.  Respiratory: Negative for cough and wheezing.  Cardiovascular: Negative for chest pain, palpitations and leg swelling.  Gastrointestinal: Positive for blood in stool and rectal pain. Negative for nausea, vomiting, abdominal pain, diarrhea, constipation, abdominal distention and anal bleeding.  Genitourinary: Negative for hematuria and difficulty urinating.  Musculoskeletal: Negative for arthralgias.  Skin: Negative for rash and wound.  Neurological: Negative for seizures, syncope, weakness and headaches.  Hematological: Negative for adenopathy. Does not bruise/bleed easily.  Psychiatric/Behavioral: Negative for confusion.   Blood pressure 120/79, pulse 78, temperature 97.6 F (36.4 C), temperature source Temporal, height 6' (1.829 m), weight 157 lb 6.4 oz (71.396 kg), SpO2 97.00%.  Physical Exam  Physical Exam  Constitutional: He is oriented to person, place, and time. He appears well-developed and well-nourished. No distress.  HENT:  Head: Normocephalic and atraumatic.  Right Ear: External ear normal.  Left Ear: External ear  normal.  Nose: Nose normal.  Mouth/Throat: Oropharynx is clear and moist. No oropharyngeal exudate.  Eyes: Conjunctivae are normal. Pupils are equal, round, and reactive to light. Right eye exhibits no discharge. Left eye exhibits no discharge. No scleral icterus.  Neck: Normal range of motion. Neck supple. No tracheal deviation present. No thyromegaly present.  Cardiovascular: Normal rate, regular rhythm, normal heart sounds and intact distal pulses.  No murmur heard.  Pulmonary/Chest: Effort normal and breath sounds normal. No respiratory distress. He has no wheezes. He has no rales.  Abdominal: Soft. Bowel sounds are normal. He exhibits no distension. There is no tenderness. There is no rebound.  Genitourinary:  On rectal examination, he has circumferential, large, anal  condyloma  Musculoskeletal: Normal range of motion. He exhibits no edema and no tenderness.  Lymphadenopathy:  He has no cervical adenopathy.  Neurological: He is alert and oriented to person, place, and time.  Skin: Skin is warm and dry. He is not diaphoretic. No erythema. No pallor.  Psychiatric: His behavior is normal. Judgment normal.   Data Reviewed  Assessment   Anal condyloma   Plan   Excision in the operating room is recommended given the size and to rule out malignancy as well as control his symptoms. I discussed this with him in detail. I discussed the risks of surgery which includes but is not limited to bleeding, infection, scarring, recurrence, need for further surgery, incontinence, et Karie Soda. He understands and wishes to proceed. Likelihood of success is good   Jenne Sellinger A

## 2011-07-06 ENCOUNTER — Encounter (HOSPITAL_BASED_OUTPATIENT_CLINIC_OR_DEPARTMENT_OTHER): Payer: Self-pay | Admitting: *Deleted

## 2011-07-06 ENCOUNTER — Ambulatory Visit (HOSPITAL_BASED_OUTPATIENT_CLINIC_OR_DEPARTMENT_OTHER)
Admission: RE | Admit: 2011-07-06 | Discharge: 2011-07-06 | Disposition: A | Discharge status: Home / Self Care | Payer: Medicare Other | Source: Ambulatory Visit | Attending: Surgery | Admitting: Surgery

## 2011-07-06 ENCOUNTER — Encounter (HOSPITAL_BASED_OUTPATIENT_CLINIC_OR_DEPARTMENT_OTHER)
Admission: RE | Disposition: A | Discharge status: Home / Self Care | Payer: Self-pay | Source: Ambulatory Visit | Attending: Surgery

## 2011-07-06 ENCOUNTER — Ambulatory Visit (HOSPITAL_BASED_OUTPATIENT_CLINIC_OR_DEPARTMENT_OTHER): Payer: Medicare Other | Admitting: Anesthesiology

## 2011-07-06 ENCOUNTER — Encounter (HOSPITAL_BASED_OUTPATIENT_CLINIC_OR_DEPARTMENT_OTHER): Payer: Self-pay | Admitting: Anesthesiology

## 2011-07-06 DIAGNOSIS — F17200 Nicotine dependence, unspecified, uncomplicated: Secondary | ICD-10-CM | POA: Insufficient documentation

## 2011-07-06 DIAGNOSIS — Z21 Asymptomatic human immunodeficiency virus [HIV] infection status: Secondary | ICD-10-CM | POA: Insufficient documentation

## 2011-07-06 DIAGNOSIS — A630 Anogenital (venereal) warts: Secondary | ICD-10-CM

## 2011-07-06 HISTORY — PX: WART FULGURATION: SHX5245

## 2011-07-06 SURGERY — FULGURATION, WART, ANUS
Anesthesia: General | Site: Rectum | Wound class: Dirty or Infected

## 2011-07-06 MED ORDER — LIDOCAINE HCL (CARDIAC) 20 MG/ML IV SOLN
INTRAVENOUS | Status: DC | PRN
  Administered 2011-07-06: 75 mg via INTRAVENOUS

## 2011-07-06 MED ORDER — FENTANYL CITRATE 0.05 MG/ML IJ SOLN
INTRAMUSCULAR | Status: DC | PRN
  Administered 2011-07-06: 50 ug via INTRAVENOUS
  Administered 2011-07-06: 100 ug via INTRAVENOUS

## 2011-07-06 MED ORDER — PROPOFOL 10 MG/ML IV EMUL
INTRAVENOUS | Status: DC | PRN
  Administered 2011-07-06: 250 mg via INTRAVENOUS

## 2011-07-06 MED ORDER — ONDANSETRON HCL 4 MG/2ML IJ SOLN: 4.0000 mg | Freq: Once | INTRAMUSCULAR | Status: DC | PRN

## 2011-07-06 MED ORDER — SODIUM CHLORIDE 0.9 % IJ SOLN: 3.0000 mL | INTRAMUSCULAR | Status: DC | PRN

## 2011-07-06 MED ORDER — MORPHINE SULFATE 4 MG/ML IJ SOLN: 4.0000 mg | INTRAMUSCULAR | Status: DC | PRN

## 2011-07-06 MED ORDER — OXYCODONE-ACETAMINOPHEN 7.5-325 MG PO TABS: 1.0000 | ORAL_TABLET | ORAL | Status: AC | PRN

## 2011-07-06 MED ORDER — LIDOCAINE 5 % EX OINT: TOPICAL_OINTMENT | CUTANEOUS | Status: DC | PRN

## 2011-07-06 MED ORDER — SODIUM CHLORIDE 0.9 % IV SOLN: 250.0000 mL | INTRAVENOUS | Status: DC | PRN

## 2011-07-06 MED ORDER — ACETAMINOPHEN 10 MG/ML IV SOLN: 1000.0000 mg | Freq: Once | INTRAVENOUS | Status: DC | PRN

## 2011-07-06 MED ORDER — CEFAZOLIN SODIUM 1-5 GM-% IV SOLN
INTRAVENOUS | Status: DC | PRN
  Administered 2011-07-06: 1 g via INTRAVENOUS

## 2011-07-06 MED ORDER — OXYCODONE HCL 5 MG PO TABS: 5.0000 mg | ORAL_TABLET | ORAL | Status: DC | PRN

## 2011-07-06 MED ORDER — ONDANSETRON HCL 4 MG/2ML IJ SOLN
INTRAMUSCULAR | Status: DC | PRN
  Administered 2011-07-06: 4 mg via INTRAVENOUS

## 2011-07-06 MED ORDER — ONDANSETRON HCL 4 MG/2ML IJ SOLN: 4.0000 mg | Freq: Four times a day (QID) | INTRAMUSCULAR | Status: DC | PRN

## 2011-07-06 MED ORDER — DEXAMETHASONE SODIUM PHOSPHATE 4 MG/ML IJ SOLN
INTRAMUSCULAR | Status: DC | PRN
  Administered 2011-07-06: 10 mg via INTRAVENOUS

## 2011-07-06 MED ORDER — ACETAMINOPHEN 650 MG RE SUPP: 650.0000 mg | RECTAL | Status: DC | PRN

## 2011-07-06 MED ORDER — BUPIVACAINE LIPOSOME 1.3 % IJ SUSP
INTRAMUSCULAR | Status: DC | PRN
  Administered 2011-07-06: 20 mL

## 2011-07-06 MED ORDER — ACETAMINOPHEN 325 MG PO TABS: 650.0000 mg | ORAL_TABLET | ORAL | Status: DC | PRN

## 2011-07-06 MED ORDER — KETOROLAC TROMETHAMINE 30 MG/ML IJ SOLN
INTRAMUSCULAR | Status: DC | PRN
  Administered 2011-07-06: 30 mg via INTRAVENOUS

## 2011-07-06 MED ORDER — LACTATED RINGERS IV SOLN
INTRAVENOUS | Status: DC
  Administered 2011-07-06 (×2): via INTRAVENOUS

## 2011-07-06 MED ORDER — HYDROMORPHONE HCL PF 1 MG/ML IJ SOLN: 0.2500 mg | INTRAMUSCULAR | Status: DC | PRN

## 2011-07-06 MED ORDER — MIDAZOLAM HCL 5 MG/5ML IJ SOLN
INTRAMUSCULAR | Status: DC | PRN
  Administered 2011-07-06: 2 mg via INTRAVENOUS

## 2011-07-06 MED ORDER — SODIUM CHLORIDE 0.9 % IJ SOLN: 3.0000 mL | Freq: Two times a day (BID) | INTRAMUSCULAR | Status: DC

## 2011-07-06 SURGICAL SUPPLY — 44 items
BLADE SURG 15 STRL LF DISP TIS (BLADE) ×1 IMPLANT
BLADE SURG 15 STRL SS (BLADE) ×1
CLEANER CAUTERY TIP 5X5 PAD (MISCELLANEOUS) ×1 IMPLANT
CLOTH BEACON ORANGE TIMEOUT ST (SAFETY) ×2 IMPLANT
COVER MAYO STAND STRL (DRAPES) ×2 IMPLANT
COVER TABLE BACK 60X90 (DRAPES) ×2 IMPLANT
DECANTER SPIKE VIAL GLASS SM (MISCELLANEOUS) IMPLANT
DRAPE UTILITY XL STRL (DRAPES) ×2 IMPLANT
DRSG PAD ABDOMINAL 8X10 ST (GAUZE/BANDAGES/DRESSINGS) ×4 IMPLANT
ELECT NEEDLE TIP 2.8 STRL (NEEDLE) ×2 IMPLANT
ELECT REM PT RETURN 9FT ADLT (ELECTROSURGICAL) ×2
ELECTRODE REM PT RTRN 9FT ADLT (ELECTROSURGICAL) ×1 IMPLANT
GAUZE SPONGE 4X4 12PLY STRL LF (GAUZE/BANDAGES/DRESSINGS) ×4 IMPLANT
GLOVE BIO SURGEON STRL SZ7.5 (GLOVE) ×2 IMPLANT
GLOVE BIOGEL PI IND STRL 7.0 (GLOVE) ×1 IMPLANT
GLOVE BIOGEL PI IND STRL 8 (GLOVE) ×1 IMPLANT
GLOVE BIOGEL PI INDICATOR 7.0 (GLOVE) ×1
GLOVE BIOGEL PI INDICATOR 8 (GLOVE) ×1
GLOVE ECLIPSE 6.5 STRL STRAW (GLOVE) ×2 IMPLANT
GLOVE SURG SIGNA 7.5 PF LTX (GLOVE) ×2 IMPLANT
GOWN PREVENTION PLUS XLARGE (GOWN DISPOSABLE) ×6 IMPLANT
NDL SAFETY ECLIPSE 18X1.5 (NEEDLE) ×1 IMPLANT
NEEDLE HYPO 18GX1.5 SHARP (NEEDLE) ×1
NEEDLE HYPO 22GX1.5 SAFETY (NEEDLE) IMPLANT
PACK BASIN DAY SURGERY FS (CUSTOM PROCEDURE TRAY) ×2 IMPLANT
PACK LITHOTOMY IV (CUSTOM PROCEDURE TRAY) ×2 IMPLANT
PAD CLEANER CAUTERY TIP 5X5 (MISCELLANEOUS) ×1
PENCIL BUTTON HOLSTER BLD 10FT (ELECTRODE) ×2 IMPLANT
SHEET MEDIUM DRAPE 40X70 STRL (DRAPES) ×2 IMPLANT
SLEEVE SCD COMPRESS KNEE MED (MISCELLANEOUS) IMPLANT
SPONGE GAUZE 4X4 12PLY (GAUZE/BANDAGES/DRESSINGS) ×2 IMPLANT
SPONGE SURGIFOAM ABS GEL 100 (HEMOSTASIS) ×8 IMPLANT
SURGILUBE 2OZ TUBE FLIPTOP (MISCELLANEOUS) ×8 IMPLANT
SYR 20CC LL (SYRINGE) ×2 IMPLANT
SYR CONTROL 10ML LL (SYRINGE) ×2 IMPLANT
TOWEL OR 17X24 6PK STRL BLUE (TOWEL DISPOSABLE) ×2 IMPLANT
TOWEL OR NON WOVEN STRL DISP B (DISPOSABLE) ×2 IMPLANT
TRAY DSU PREP LF (CUSTOM PROCEDURE TRAY) ×2 IMPLANT
TRAY PROCTOSCOPIC FIBER OPTIC (SET/KITS/TRAYS/PACK) IMPLANT
TUBE CONNECTING 20X1/4 (TUBING) ×2 IMPLANT
TUBING STERILE (MISCELLANEOUS) ×2 IMPLANT
UNDERPAD 30X30 INCONTINENT (UNDERPADS AND DIAPERS) ×2 IMPLANT
WATER STERILE IRR 1000ML POUR (IV SOLUTION) IMPLANT
YANKAUER SUCT BULB TIP NO VENT (SUCTIONS) ×2 IMPLANT

## 2011-07-06 NOTE — Transfer of Care (Signed)
Immediate Anesthesia Transfer of Care Note  Patient: [name]  Procedure(s) Performed: Procedure(s) (LRB): FULGURATION ANAL WART (N/A)  Patient Location: PACU  Anesthesia Type: General  Level of Consciousness: awake, alert  and oriented  Airway & Oxygen Therapy: Patient Spontanous Breathing and Patient connected to nasal cannula oxygen  Post-op Assessment: Report given to PACU RN and Post -op Vital signs reviewed and stable  Post vital signs: Reviewed and stable  Complications: No apparent anesthesia complications

## 2011-07-06 NOTE — Interval H&P Note (Signed)
History and Physical Interval Note:  No change in history or exam  07/06/2011 7:08 AM  [name]  has presented today for surgery, with the diagnosis of anal condyloma  The various methods of treatment have been discussed with the patient and family. After consideration of risks, benefits and other options for treatment, the patient has consented to  Procedure(s) (LRB): EXCISION OF ANAL CONDYLOMA (N/A) as a surgical intervention .  The patient's history has been reviewed, patient examined, no change in status, stable for surgery.  I have reviewed the patients' chart and labs.  Questions were answered to the patient's satisfaction.     Sundra Haddix A

## 2011-07-06 NOTE — Op Note (Signed)
FULGURATION ANAL WART  Procedure Note  Haseeb Fiallos 07/06/2011   Pre-op Diagnosis: anal condyloma     Post-op Diagnosis: same  Procedure(s): EXCISION OF ANAL CONDYLOMA  Surgeon(s): Shelly Rubenstein, MD  Anesthesia: General  Staff:  Aleen Sells, RN - Circulator Joylene Grapes, RN - Scrub Person Nolon Nations Ward, RN - Circulator Assistant  Estimated Blood Loss: Minimal               Specimens: anal condyloma sent to path         Procedure: The patient was brought to the operating room and identified the correct patient. He was placed supine on the operative table and general anesthesia was induced. The patient was then placed in lithotomy position. His perianal area was then prepped and draped in the usual sterile fashion. He had very large condyloma circumferentially around his anal canal and external anal skin. I anesthetize skin circumferentially with bupivacaine. I then excised all the visible condyloma with electrocautery circumferentially. I inserted a retractor into the anal canal and removed several intra-anal condylomata as well. No large mass or hemorrhoids were identified. Once all the condyloma were removed I achieved hemostasis with the electrocautery. I then placed a piece of Gelfoam into the anal canal. I anesthetized the perianal area further with bupivacaine and then gauze was applied. Hemostasis appeared to be achieved. All counts were correct at the end of the case. The patient was then intubated in the operating room and taken in a stable condition to the recovery room. Atiya Yera A   Date: 07/06/2011  Time: 8:07 AM

## 2011-07-06 NOTE — Anesthesia Postprocedure Evaluation (Signed)
  Anesthesia Post-op Note  Patient: [name]  Procedure(s) Performed: Procedure(s) (LRB): FULGURATION ANAL WART (N/A)  Patient Location: PACU  Anesthesia Type: General  Level of Consciousness: awake, alert  and oriented  Airway and Oxygen Therapy: Patient Spontanous Breathing  Post-op Pain: mild  Post-op Assessment: Post-op Vital signs reviewed and Patient's Cardiovascular Status Stable  Post-op Vital Signs: stable  Complications: No apparent anesthesia complications

## 2011-07-06 NOTE — Anesthesia Preprocedure Evaluation (Signed)
Anesthesia Evaluation  Patient identified by MRN, date of birth, ID band Patient awake    Reviewed: Allergy & Precautions, H&P   Airway Mallampati: II      Dental  (+) Teeth Intact   Pulmonary  breath sounds clear to auscultation        Cardiovascular Rhythm:Regular Rate:Normal     Neuro/Psych    GI/Hepatic   Endo/Other    Renal/GU      Musculoskeletal   Abdominal   Peds  Hematology   Anesthesia Other Findings   Reproductive/Obstetrics                           Anesthesia Physical Anesthesia Plan  ASA: III  Anesthesia Plan: General   Post-op Pain Management:    Induction: Intravenous  Airway Management Planned: LMA  Additional Equipment:   Intra-op Plan:   Post-operative Plan:   Informed Consent: I have reviewed the patients History and Physical, chart, labs and discussed the procedure including the risks, benefits and alternatives for the proposed anesthesia with the patient or authorized representative who has indicated his/her understanding and acceptance.   Dental advisory given  Plan Discussed with:   Anesthesia Plan Comments: (Anal condylomata HIV (+)  Plan GA   Kipp Brood, MD)        Anesthesia Quick Evaluation

## 2011-07-06 NOTE — Discharge Instructions (Signed)
[facility]  RECTAL SURGERY POST OP INSTRUCTIONS: POST OP INSTRUCTIONS  Always review your discharge instruction sheet given to you by the facility where your surgery was performed. IF YOU HAVE DISABILITY OR FAMILY LEAVE FORMS, YOU MUST BRING THEM TO THE OFFICE FOR PROCESSING.   DO NOT GIVE THEM TO YOUR DOCTOR.  1. A  prescription for pain medication may be given to you upon discharge.  Take your pain medication as prescribed, if needed.  If narcotic pain medicine is not needed, then you may take acetaminophen (Tylenol) or ibuprofen (Advil) as needed. 2. Take your usually prescribed medications unless otherwise directed. 3. If you need a refill on your pain medication, please contact your pharmacy.  They will contact our office to request authorization. Prescriptions will not be filled after 5 pm or on week-ends. 4. You should follow a light diet the first 48 hours after arrival home, such as soup and crackers, etc.  Be sure to include lots of fluids daily.  Resume your normal diet 2-3 days after surgery.. 5. Most patients will experience some swelling and discomfort in the rectal area. Ice packs, reclining and warm tub soaks will help.  Swelling and discomfort can take several days to resolve.  6. It is common to experience some constipation if taking pain medication after surgery.  Increasing fluid intake and taking a stool softener (such as Colace) will usually help or prevent this problem from occurring.  A mild laxative (Milk of Magnesia or Miralax) should be taken according to package directions if there are no bowel movements after 48 hours. 7. Unless discharge instructions indicate otherwise, leave your bandage dry and in place for 24 hours, or remove the bandage if you have a bowel movement. You may notice a small amount of bleeding with bowel movements for the first few days. You may have some packing in the rectum which will come out over the first day or two. You  will need to wear an absorbent pad or soft cotton gauze in your underwear until the drainage stops.it. 8. ACTIVITIES:  You may resume regular (light) daily activities beginning the next day--such as daily self-care, walking, climbing stairs--gradually increasing activities as tolerated.  You may have sexual intercourse when it is comfortable.  Refrain from any heavy lifting or straining until approved by your doctor. a. You may drive when you are no longer taking prescription pain medication, you can comfortably wear a seatbelt, and you can safely maneuver your car and apply brakes. b. RETURN TO WORK: : ____________________ c.  9. You should see your doctor in the office for a follow-up appointment approximately 2-3 weeks after your surgery.  Make sure that you call for this appointment within a day or two after you arrive home to insure a convenient appointment time. 10. OTHER INSTRUCTIONS:  __________________________________________________________________________________________________________________________________________________________________________________________  WHEN TO CALL YOUR DOCTOR: 1. Fever over 101.0 2. Inability to urinate 3. Nausea and/or vomiting 4. Extreme swelling or bruising 5. Continued bleeding from rectum. 6. Increased pain, redness, or drainage from the incision 7. Constipation  The clinic staff is available to answer your questions during regular business hours.  Please don't hesitate to call and ask to speak to one of the nurses for clinical concerns.  If you have a medical emergency, go to the nearest emergency room or call 911.  A surgeon from [facility] is always on call at the hospital   [address], [place], [place], [place]  [address] ?  [id number], [place], [place]   [address] [phone] ? [phone] ? FAX [phone] Web site: [email or web address]    Call your surgeon if you experience:   1.  Fever over  101.0. 2.  Inability to urinate. 3.  Nausea and/or vomiting. 4.  Extreme swelling or bruising at the surgical site. 5.  Continued bleeding from the incision. 6.  Increased pain, redness or drainage from the incision. 7.  Problems related to your pain medication.    Post Anesthesia Home Care Instructions  Activity: Get plenty of rest for the remainder of the day. A responsible adult should stay with you for 24 hours following the procedure.  For the next 24 hours, DO NOT: -Drive a car -Advertising copywriter -Drink alcoholic beverages -Take any medication unless instructed by your physician -Make any legal decisions or sign important papers.  Meals: Start with liquid foods such as gelatin or soup. Progress to regular foods as tolerated. Avoid greasy, spicy, heavy foods. If nausea and/or vomiting occur, drink only clear liquids until the nausea and/or vomiting subsides. Call your physician if vomiting continues.  Special Instructions/Symptoms: Your throat may feel dry or sore from the anesthesia or the breathing tube placed in your throat during surgery. If this causes discomfort, gargle with warm salt water. The discomfort should disappear within 24 hours.

## 2011-07-06 NOTE — Anesthesia Procedure Notes (Signed)
Procedure Name: LMA Insertion Date/Time: 07/06/2011 7:38 AM Performed by: Zenia Resides D Pre-anesthesia Checklist: Patient identified, Emergency Drugs available, Suction available, Patient being monitored and Timeout performed Patient Re-evaluated:Patient Re-evaluated prior to inductionOxygen Delivery Method: Circle System Utilized Preoxygenation: Pre-oxygenation with 100% oxygen Intubation Type: IV induction Ventilation: Mask ventilation without difficulty LMA: LMA inserted LMA Size: 5.0 Number of attempts: 1 Airway Equipment and Method: bite block Placement Confirmation: positive ETCO2 and breath sounds checked- equal and bilateral Tube secured with: Tape Dental Injury: Teeth and Oropharynx as per pre-operative assessment

## 2011-07-10 ENCOUNTER — Encounter (HOSPITAL_BASED_OUTPATIENT_CLINIC_OR_DEPARTMENT_OTHER): Payer: Self-pay | Admitting: Surgery

## 2011-07-11 ENCOUNTER — Telehealth (INDEPENDENT_AMBULATORY_CARE_PROVIDER_SITE_OTHER): Payer: Self-pay | Admitting: General Surgery

## 2011-07-11 NOTE — Telephone Encounter (Signed)
Pt calling for refill of pain meds.  He understands we will be calling in step-down to Hydrocodone 5/325 mg,  # 30, 1 po Q 4-6 H prn, no refill.  [facility]:  [phone]

## 2011-07-14 ENCOUNTER — Telehealth (INDEPENDENT_AMBULATORY_CARE_PROVIDER_SITE_OTHER): Payer: Self-pay | Admitting: General Surgery

## 2011-07-14 NOTE — Telephone Encounter (Signed)
Ok to call in lidocaine 5%cream  3 week supply with 1 refill

## 2011-07-14 NOTE — Telephone Encounter (Signed)
Pt is calling to request a refill of his Lidocaine cream 5%.  (If OK, call to [facility]: [phone])   Also he reports bleeding with BMs, but stops shortly thereafter.

## 2011-08-03 ENCOUNTER — Ambulatory Visit (INDEPENDENT_AMBULATORY_CARE_PROVIDER_SITE_OTHER): Payer: Medicare Other | Admitting: Surgery

## 2011-08-03 ENCOUNTER — Encounter (INDEPENDENT_AMBULATORY_CARE_PROVIDER_SITE_OTHER): Payer: Self-pay | Admitting: Surgery

## 2011-08-03 VITALS — BP 144/99 | HR 91 | Temp 98.5°F | Ht 72.0 in | Wt 161.0 lb

## 2011-08-03 DIAGNOSIS — Z09 Encounter for follow-up examination after completed treatment for conditions other than malignant neoplasm: Secondary | ICD-10-CM

## 2011-08-03 NOTE — Progress Notes (Signed)
Subjective:     Patient ID: [name], male   DOB: 31-May-1960, 51 y.o.   MRN: [id number]  HPI He is here for his first postop visit status post excision of perianal condyloma circumferentially. He has minimal discomfort and drainage. He complains only of itching  Review of Systems     Objective:   Physical Exam    On exam, his incisions are healing well. Pathology showed no evidence of malignancy or high-grade dysplasia Assessment:     Patient postop status post excision of anal condyloma    Plan:     I will see him back in 3 months

## 2011-08-30 ENCOUNTER — Other Ambulatory Visit: Payer: Self-pay | Admitting: Internal Medicine

## 2011-08-30 DIAGNOSIS — B20 Human immunodeficiency virus [HIV] disease: Secondary | ICD-10-CM

## 2011-09-01 ENCOUNTER — Telehealth (INDEPENDENT_AMBULATORY_CARE_PROVIDER_SITE_OTHER): Payer: Self-pay | Admitting: General Surgery

## 2011-09-01 NOTE — Telephone Encounter (Signed)
PT CALLED EARLIER THIS AM STATING THAT HIS W[employer]LET/ DRIVERS LICENSE AND SS CARD WERE STOLEN AND THAT HE COULD HAVE HIS SS CARD REISSUED IF DR. [facility] SIGNED HIS PAPER WORK FROM THE HOSPITAL REGARDING SURGERY. I TOLD HIM I WOULD CHECK WITH DR. BLACKMAN. I REVIEWED THIS WITH DR. BLACKMAN AND HE SAID PT COULD BRING PAPER WORK TO THE OFFICE AND HE WOULD SIGN IT. I LEFT A MESSAGE ON [facility]'S CELL PHONE THAT HE COULD COME BY THE OFFICE AROUND 1PM TODAY/GY

## 2011-09-06 ENCOUNTER — Other Ambulatory Visit: Payer: Medicare Other

## 2011-09-10 ENCOUNTER — Encounter (HOSPITAL_COMMUNITY): Payer: Self-pay | Admitting: Emergency Medicine

## 2011-09-10 ENCOUNTER — Emergency Department (HOSPITAL_COMMUNITY)
Admission: EM | Admit: 2011-09-10 | Discharge: 2011-09-10 | Disposition: A | Discharge status: Home / Self Care | Payer: Medicare Other | Attending: Emergency Medicine | Admitting: Emergency Medicine

## 2011-09-10 DIAGNOSIS — K089 Disorder of teeth and supporting structures, unspecified: Secondary | ICD-10-CM | POA: Insufficient documentation

## 2011-09-10 DIAGNOSIS — F17200 Nicotine dependence, unspecified, uncomplicated: Secondary | ICD-10-CM | POA: Insufficient documentation

## 2011-09-10 DIAGNOSIS — K047 Periapical abscess without sinus: Secondary | ICD-10-CM

## 2011-09-10 DIAGNOSIS — Z21 Asymptomatic human immunodeficiency virus [HIV] infection status: Secondary | ICD-10-CM | POA: Insufficient documentation

## 2011-09-10 LAB — CBC
HCT: 39.7 % (ref 39.0–52.0)
MCH: 27.9 pg (ref 26.0–34.0)
MCHC: 33.5 g/dL (ref 30.0–36.0)
MCV: 83.4 fL (ref 78.0–100.0)
Platelets: 319 10*3/uL (ref 150–400)
RDW: 14.3 % (ref 11.5–15.5)

## 2011-09-10 LAB — BASIC METABOLIC PANEL
BUN: 7 mg/dL (ref 6–23)
Calcium: 9.3 mg/dL (ref 8.4–10.5)
Chloride: 104 mEq/L (ref 96–112)
Creatinine, Ser: 1.01 mg/dL (ref 0.50–1.35)
GFR calc Af Amer: >90 mL/min (ref >90)
GFR calc non Af Amer: 85 mL/min — ABNORMAL LOW (ref >90)

## 2011-09-10 MED ORDER — OXYCODONE-ACETAMINOPHEN 5-325 MG PO TABS
2.0000 | ORAL_TABLET | Freq: Once | ORAL | Status: AC
  Administered 2011-09-10: 2 via ORAL
  Filled 2011-09-10: qty 2

## 2011-09-10 MED ORDER — CLINDAMYCIN HCL 150 MG PO CAPS: 150.0000 mg | ORAL_CAPSULE | Freq: Four times a day (QID) | ORAL | Status: AC

## 2011-09-10 MED ORDER — CLINDAMYCIN PHOSPHATE 600 MG/50ML IV SOLN
600.0000 mg | Freq: Once | INTRAVENOUS | Status: AC
  Administered 2011-09-10: 600 mg via INTRAVENOUS
  Filled 2011-09-10: qty 50

## 2011-09-10 MED ORDER — IBUPROFEN 800 MG PO TABS: 800.0000 mg | ORAL_TABLET | Freq: Three times a day (TID) | ORAL | Status: AC

## 2011-09-10 MED ORDER — OXYCODONE-ACETAMINOPHEN 5-325 MG PO TABS: 2.0000 | ORAL_TABLET | ORAL | Status: AC | PRN

## 2011-09-10 MED ORDER — SODIUM CHLORIDE 0.9 % IV SOLN
Freq: Once | INTRAVENOUS | Status: AC
  Administered 2011-09-10: 19:00:00 via INTRAVENOUS

## 2011-09-10 NOTE — ED Provider Notes (Signed)
Medical screening examination/treatment/procedure(s) were conducted as a shared visit with non-physician practitioner(s) and myself.  I personally evaluated the patient during the encounter  Eragon T Cyndia Degraff, MD 09/10/11 2338 

## 2011-09-10 NOTE — ED Provider Notes (Signed)
History     CSN: [id number]  Arrival date & time 09/10/11  1313   First MD Initiated Contact with Patient 09/10/11 1747      Chief Complaint  Patient presents with  . Dental Pain    (Consider location/radiation/quality/duration/timing/severity/associated sxs/prior treatment) Patient is a 51 y.o. male presenting with tooth pain. The history is provided by the patient. No language interpreter was used.  Dental PainThe primary symptoms include mouth pain. Primary symptoms do not include dental injury, fever, shortness of breath, sore throat or cough. The symptoms began 3 to 5 days ago. The symptoms are worsening. The symptoms are recurrent. The symptoms occur constantly.  Additional symptoms include: gum swelling, gum tenderness, facial swelling and ear pain. Additional symptoms do not include: dental sensitivity to temperature, trismus, trouble swallowing, pain with swallowing and dry mouth. Medical issues include: alcohol problem and smoking. Associated medical issues comments: +hiv.   51 year old male reports right upper molar pain x3 days with facial swelling. Patient is HIV positive. He has taken over-the-counter medication for pain with no relief. There is swelling to the gum. No pain with eye movement. No fever.  Past Medical History  Diagnosis Date  . HIV (human immunodeficiency virus infection)   . Hemorrhoids   . 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 Tendon repair     2006-lt index  . Hernia repair 97    lt ing  . Wart fulguration 05/31/2011    Procedure: FULGURATION ANAL WART;  Surgeon: Shelly Rubenstein, MD;  Location: [facility];  Service: General;  Laterality: N/A;  excision anal condyloma  . Wart fulguration 07/06/2011    Procedure: FULGURATION ANAL WART;  Surgeon: Shelly Rubenstein, MD;  Location: [facility];  Service: General;  Laterality: N/A;  excision anal condyloma    Family History  Problem Relation  Age of Onset  . Cancer Mother     brain  . Cancer Sister     breast    History  Substance Use Topics  . Smoking status: Current Everyday Smoker -- 1.0 packs/day    Types: Cigarettes  . Smokeless tobacco: Never Used  . Alcohol Use: Yes     6-12pk beer almost everyday      Review of Systems  Constitutional: Negative.  Negative for fever.  HENT: Positive for ear pain and facial swelling. Negative for sore throat, trouble swallowing and neck pain.   Eyes: Negative.  Negative for pain and redness.  Respiratory: Negative.  Negative for cough and shortness of breath.   Cardiovascular: Negative.   Gastrointestinal: Negative.   Neurological: Negative.   Psychiatric/Behavioral: Negative.   All other systems reviewed and are negative.    Allergies  Shellfish allergy; Sulfa antibiotics; and Sulfonamide derivatives  Home Medications   Current Outpatient Rx  Name Route Sig Dispense Refill  . BUPROPION HCL ER (XL) 300 MG PO TB24 Oral Take 300 mg by mouth daily.    Marland Kitchen EMTRICITABINE-TENOFOVIR 200-300 MG PO TABS Oral Take 1 tablet by mouth daily. 30 tablet 5  . HYDROXYZINE PAMOATE 25 MG PO CAPS Oral Take 50 mg by mouth at bedtime as needed. For sleep.    . IBUPROFEN 800 MG PO TABS Oral Take 800 mg by mouth every 8 (eight) hours as needed. For pain    . LOPINAVIR-RITONAVIR 200-50 MG PO TABS Oral Take 2 tablets by mouth 2 (two) times daily. 120 tablet 5    BP 134/95  Pulse 95  Temp 98.1 F (36.7 C) (Oral)  Resp 24  SpO2 100%  Physical Exam  Nursing note and vitals reviewed. Constitutional: He is oriented to person, place, and time. He appears well-developed and well-nourished.  HENT:  Head: Normocephalic.  Eyes: Conjunctivae and EOM are normal. Pupils are equal, round, and reactive to light.  Neck: Normal range of motion. Neck supple.  Cardiovascular: Normal rate.   Pulmonary/Chest: Effort normal and breath sounds normal. No respiratory distress.  Abdominal: Soft. Bowel sounds  are normal. He exhibits no distension.  Musculoskeletal: Normal range of motion.  Neurological: He is alert and oriented to person, place, and time.  Skin: Skin is warm and dry.       Right facial swelling  Psychiatric: He has a normal mood and affect.    ED Course  Procedures (including critical care time)   Labs Reviewed  CBC   No results found.   No diagnosis found.    MDM  Right upper molar pain with gum swelling and facial swelling. HIV positive. IV clindamycin given in the ER. We'll put him on by mouth clindamycin with a recheck tomorrow if not better. Dental followup on Tuesday. Prescription for Percocet for pain. Patient was instructed to follow up on Tuesday said the dentist we have on call and see him that day.        Remi Haggard, NP 09/10/11 1946

## 2011-09-10 NOTE — ED Notes (Signed)
Pt presents w/ right upper rear molar pain, facial swelling and cheek hot to touch. Pt is HIV positive and would like to get this in check before it becomes a problem for him.

## 2011-09-20 ENCOUNTER — Other Ambulatory Visit: Payer: Medicare Other

## 2011-09-20 ENCOUNTER — Ambulatory Visit: Payer: Medicare Other | Admitting: Internal Medicine

## 2011-09-26 ENCOUNTER — Encounter (INDEPENDENT_AMBULATORY_CARE_PROVIDER_SITE_OTHER): Payer: Self-pay | Admitting: Surgery

## 2011-10-09 NOTE — Progress Notes (Signed)
HIV CLINIC NOTE  RFV: routine follow up Subjective:    Patient ID: [name], male    DOB: 04/28/1960, 51 y.o.   MRN: [id number]  HPI [name] is a 51yo Male with HIV, CD 4 count of 350(20%)/VL 34, currently on truvada and kaletra, missing roughly 1-2 doses per month. He also suffers from alcohol dependence/abuse, where he drinks 5 nights a week, anywhere from 4-5 beers per night. He has not blacked out, had DTs, or any hematemesis. No complaints about health presently  Soc hx: lives in apt by himself, works part time cleaning houses, and volunterrins at [employer]  Current Outpatient Prescriptions on File Prior to Visit  Medication Sig Dispense Refill  . buPROPion (WELLBUTRIN XL) 300 MG 24 hr tablet Take 300 mg by mouth daily.      . hydrOXYzine (VISTARIL) 25 MG capsule Take 50 mg by mouth at bedtime as needed. For sleep.      Marland Kitchen ibuprofen (ADVIL,MOTRIN) 800 MG tablet Take 800 mg by mouth every 8 (eight) hours as needed. For pain       Active Ambulatory Problems    Diagnosis Date Noted  . HIV DISEASE 11/09/2008  . 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 No Resolved Ambulatory Problems   Past Medical History  Diagnosis Date  . HIV (human immunodeficiency virus infection)   . Hemorrhoids      Review of Systems Review of Systems  Constitutional: Negative for fever, chills, diaphoresis, activity change, appetite change, fatigue and unexpected weight change.  HENT: Negative for congestion, sore throat, rhinorrhea, sneezing, trouble swallowing and sinus pressure.  Eyes: Negative for photophobia and visual disturbance.  Respiratory: Negative for cough, chest tightness, shortness of breath, wheezing and stridor.  Cardiovascular: Negative for chest pain, palpitations and leg swelling.  Gastrointestinal: Negative for nausea, vomiting, abdominal pain, diarrhea, constipation, blood in stool, abdominal distention and anal bleeding.    Genitourinary: Negative for dysuria, hematuria, flank pain and difficulty urinating.  Musculoskeletal: Negative for myalgias, back pain, joint swelling, arthralgias and gait problem.  Skin: Negative for color change, pallor, rash and wound.  Neurological: Negative for dizziness, tremors, weakness and light-headedness.  Hematological: Negative for adenopathy. Does not bruise/bleed easily.  Psychiatric/Behavioral: Negative for behavioral problems, confusion, sleep disturbance, dysphoric mood, decreased concentration and agitation.       Objective:   Physical Exam  BP 141/97  Pulse 97  Temp 98.3 F (36.8 C) (Oral)  Wt 155 lb (70.308 kg) Physical Exam  Constitutional: He is oriented to person, place, and time. He appears well-developed and well-nourished. No distress.  HENT:  Mouth/Throat: Oropharynx is clear and moist. No oropharyngeal exudate.  Cardiovascular: Normal rate, regular rhythm and normal heart sounds. Exam reveals no gallop and no friction rub.  No murmur heard.  Pulmonary/Chest: Effort normal and breath sounds normal. No respiratory distress. He has no wheezes.  Abdominal: Soft. Bowel sounds are normal. He exhibits no distension. There is no tenderness.  Lymphadenopathy:  He has no cervical adenopathy.  Neurological: He is alert and oriented to person, place, and time.  Skin: Skin is warm and dry. No rash noted. No erythema.  Psychiatric: He has a normal mood and affect. His behavior is normal.       Assessment & Plan:  hiv = continue with current medications, doing surprisingly well with low level viremia despite alcohol dependence  Alcohol dependence= will follow [facility] and consider ruq u/s to see if signs  of cirrhosis.  Health maintenance= to get flu vax at next visit; will place ppd at next visit  rtc in 3 months

## 2011-10-11 ENCOUNTER — Other Ambulatory Visit (INDEPENDENT_AMBULATORY_CARE_PROVIDER_SITE_OTHER): Payer: Medicare Other

## 2011-10-11 ENCOUNTER — Ambulatory Visit (INDEPENDENT_AMBULATORY_CARE_PROVIDER_SITE_OTHER): Payer: Medicare Other

## 2011-10-11 DIAGNOSIS — B20 Human immunodeficiency virus [HIV] disease: Secondary | ICD-10-CM

## 2011-10-11 DIAGNOSIS — Z23 Encounter for immunization: Secondary | ICD-10-CM

## 2011-10-12 LAB — COMPREHENSIVE METABOLIC PANEL
ALT: 30 U/L (ref 0–53)
CO2: 22 mEq/L (ref 19–32)
Creat: 1.13 mg/dL (ref 0.50–1.35)
Glucose, Bld: 102 mg/dL — ABNORMAL HIGH (ref 70–99)
Total Bilirubin: 0.4 mg/dL (ref 0.3–1.2)

## 2011-10-12 LAB — CBC WITH DIFFERENTIAL/PLATELET
Basophils Relative: 0 % (ref 0–1)
Eosinophils Absolute: 0.2 10*3/uL (ref 0.0–0.7)
Eosinophils Relative: 4 % (ref 0–5)
Lymphs Abs: 1.7 10*3/uL (ref 0.7–4.0)
MCH: 27.2 pg (ref 26.0–34.0)
MCHC: 33.8 g/dL (ref 30.0–36.0)
MCV: 80.6 fL (ref 78.0–100.0)
Platelets: 278 10*3/uL (ref 150–400)
RBC: 4.48 MIL/uL (ref 4.22–5.81)

## 2011-10-12 LAB — T-HELPER CELL (CD4) - (RCID CLINIC ONLY)
CD4 % Helper T Cell: 18 % — ABNORMAL LOW (ref 33–55)
CD4 T Cell Abs: 370 uL — ABNORMAL LOW (ref 400–2700)

## 2011-10-31 ENCOUNTER — Ambulatory Visit: Payer: Medicare Other | Admitting: Internal Medicine

## 2011-10-31 ENCOUNTER — Encounter: Payer: Self-pay | Admitting: Internal Medicine

## 2011-10-31 ENCOUNTER — Ambulatory Visit (INDEPENDENT_AMBULATORY_CARE_PROVIDER_SITE_OTHER): Payer: Medicare Other | Admitting: Internal Medicine

## 2011-10-31 VITALS — BP 138/92 | HR 91 | Temp 97.9°F | Ht 72.0 in | Wt 171.0 lb

## 2011-10-31 DIAGNOSIS — Z21 Asymptomatic human immunodeficiency virus [HIV] infection status: Secondary | ICD-10-CM

## 2011-10-31 DIAGNOSIS — B20 Human immunodeficiency virus [HIV] disease: Secondary | ICD-10-CM

## 2011-10-31 MED ORDER — DARUNAVIR ETHANOLATE 800 MG PO TABS: 800.0000 mg | ORAL_TABLET | Freq: Every day | ORAL | Status: DC

## 2011-10-31 MED ORDER — EMTRICITABINE-TENOFOVIR DF 200-300 MG PO TABS: 1.0000 | ORAL_TABLET | Freq: Every day | ORAL | Status: DC

## 2011-10-31 MED ORDER — RITONAVIR 100 MG PO TABS: 100.0000 mg | ORAL_TABLET | Freq: Every day | ORAL | Status: DC

## 2011-10-31 NOTE — Progress Notes (Signed)
HIV CLINIC   RFV: routine follow up Subjective:    Patient ID: [name], male    DOB: 1960/05/09, 51 y.o.   MRN: [id number]  HPI  [name] is a 51yo Male with HIV, CD 4 count of 370/VL <20, currently on truvada and kaletra, missing roughly 1-2 doses per month. Here with nephew. The patient  suffers from alcohol dependence/abuse, where he drinks 5 nights a week, anywhere from 4-5 beers per night. He has not blacked out, had DTs, or any hematemesis. No complaints about health presently   Has been in [place] for the last 3 wks caring for his sister with breast cancer, talking about hospice  Current Outpatient Prescriptions on File Prior to Visit  Medication Sig Dispense Refill  . buPROPion (WELLBUTRIN XL) 300 MG 24 hr tablet Take 300 mg by mouth daily.      . hydrOXYzine (VISTARIL) 25 MG capsule Take 50 mg by mouth at bedtime as needed. For sleep.      Marland Kitchen ibuprofen (ADVIL,MOTRIN) 800 MG tablet Take 800 mg by mouth every 8 (eight) hours as needed. For pain      . lopinavir-ritonavir (KALETRA) 200-50 MG per tablet Take 2 tablets by mouth 2 (two) times daily.  120 tablet  5   Active Ambulatory Problems    Diagnosis Date Noted  . HIV DISEASE 11/09/2008  . 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 No Resolved Ambulatory Problems   Past Medical History  Diagnosis Date  . HIV (human immunodeficiency virus infection)   . Hemorrhoids    S/p resection of anal condyloma in mid June 2013 Soc hx: lives in apt by himself, works part time cleaning houses, and volunterrins at [employer]   Review of Systems Diarrhea; all other 10 point ROS is negative    Objective:   Physical Exam BP 138/92  Pulse 91  Temp 97.9 F (36.6 C) (Oral)  Ht 6' (1.829 m)  Wt 171 lb (77.565 kg)  BMI 23.19 kg/m2 Physical Exam  Constitutional: He is oriented to person, place, and time. He appears well-developed and well-nourished. No distress.  HENT:  Mouth/Throat:  Oropharynx is clear and moist. No oropharyngeal exudate.  Cardiovascular: Normal rate, regular rhythm and normal heart sounds. Exam reveals no gallop and no friction rub.  No murmur heard.  Pulmonary/Chest: Effort normal and breath sounds normal. No respiratory distress. He has no wheezes.  Abdominal: Soft. Bowel sounds are normal. He exhibits no distension. There is no tenderness.  Lymphadenopathy:  He has no cervical adenopathy.  Neurological: He is alert and oriented to person, place, and time.  Skin: Skin is warm and dry. No rash noted. No erythema.  Psychiatric: He has a normal mood and affect. His behavior is normal.          Assessment & Plan:  HIV =continue withTruvada; will switch darunavir 800mg  daily, and norvir 100mg  daily and stop kaletra  Health maintenance = received flu vax today  Alcohol dependence = is contemplating going to rehab/ or o/p program next month  New [facility]: [facility] [address] in [place]

## 2011-11-06 ENCOUNTER — Other Ambulatory Visit (INDEPENDENT_AMBULATORY_CARE_PROVIDER_SITE_OTHER): Payer: Self-pay

## 2011-11-13 ENCOUNTER — Encounter (INDEPENDENT_AMBULATORY_CARE_PROVIDER_SITE_OTHER): Payer: Medicare Other | Admitting: Surgery

## 2011-11-23 ENCOUNTER — Encounter (INDEPENDENT_AMBULATORY_CARE_PROVIDER_SITE_OTHER): Payer: Self-pay | Admitting: Surgery

## 2011-11-28 ENCOUNTER — Encounter (INDEPENDENT_AMBULATORY_CARE_PROVIDER_SITE_OTHER): Payer: Medicare Other | Admitting: Surgery

## 2011-12-14 ENCOUNTER — Ambulatory Visit: Payer: Medicare Other | Admitting: Internal Medicine

## 2011-12-14 ENCOUNTER — Encounter: Payer: Self-pay | Admitting: *Deleted

## 2011-12-14 ENCOUNTER — Telehealth: Payer: Self-pay | Admitting: *Deleted

## 2011-12-14 NOTE — Telephone Encounter (Signed)
Left message on voicemail letting him know he missed his Dr's appointment today and asked him to please reschedule at his earliest convenience. Andree Coss, RN

## 2011-12-18 NOTE — Telephone Encounter (Signed)
error 

## 2012-03-07 ENCOUNTER — Other Ambulatory Visit: Payer: Medicare Other

## 2012-03-07 ENCOUNTER — Other Ambulatory Visit: Payer: Self-pay | Admitting: Internal Medicine

## 2012-03-07 LAB — CBC WITH DIFFERENTIAL/PLATELET
Basophils Absolute: 0 10*3/uL (ref 0.0–0.1)
Basophils Relative: 0 % (ref 0–1)
HCT: 41.9 % (ref 39.0–52.0)
Lymphocytes Relative: 38 % (ref 12–46)
MCHC: 33.7 g/dL (ref 30.0–36.0)
Monocytes Absolute: 0.5 10*3/uL (ref 0.1–1.0)
Neutro Abs: 2.3 10*3/uL (ref 1.7–7.7)
Neutrophils Relative %: 50 % (ref 43–77)
RDW: 15.2 % (ref 11.5–15.5)
WBC: 4.6 10*3/uL (ref 4.0–10.5)

## 2012-03-07 LAB — COMPREHENSIVE METABOLIC PANEL
ALT: 28 U/L (ref 0–53)
Albumin: 4.5 g/dL (ref 3.5–5.2)
BUN: 16 mg/dL (ref 6–23)
CO2: 25 mEq/L (ref 19–32)
Calcium: 9.9 mg/dL (ref 8.4–10.5)
Chloride: 100 mEq/L (ref 96–112)
Creat: 1.18 mg/dL (ref 0.50–1.35)
Potassium: 3.9 mEq/L (ref 3.5–5.3)

## 2012-03-07 LAB — LIPID PANEL
Cholesterol: 209 mg/dL — ABNORMAL HIGH (ref 0–200)
Total CHOL/HDL Ratio: 3.1 Ratio
VLDL: 36 mg/dL (ref 0–40)

## 2012-03-08 LAB — T-HELPER CELL (CD4) - (RCID CLINIC ONLY)
CD4 % Helper T Cell: 15 % — ABNORMAL LOW (ref 33–55)
CD4 T Cell Abs: 290 uL — ABNORMAL LOW (ref 400–2700)

## 2012-03-11 LAB — HIV-1 RNA QUANT-NO REFLEX-BLD: HIV 1 RNA Quant: <20 copies/mL (ref <20)

## 2012-03-21 ENCOUNTER — Telehealth: Payer: Self-pay | Admitting: *Deleted

## 2012-03-21 ENCOUNTER — Ambulatory Visit: Payer: Medicare Other | Admitting: Internal Medicine

## 2012-03-21 NOTE — Telephone Encounter (Signed)
Called patient's listed numbers to notify him of his no-show appointment today.  Phone had a recorded message that stated the "phone was turned off or out of the service area at this time."  Patient did show up for his lab appointment 2 weeks ago. Andree Coss, RN

## 2012-04-09 ENCOUNTER — Encounter: Payer: Self-pay | Admitting: *Deleted

## 2012-11-15 ENCOUNTER — Other Ambulatory Visit: Payer: Self-pay | Admitting: Licensed Clinical Social Worker

## 2012-11-15 DIAGNOSIS — B20 Human immunodeficiency virus [HIV] disease: Secondary | ICD-10-CM

## 2012-11-15 MED ORDER — DARUNAVIR ETHANOLATE 800 MG PO TABS: 800.0000 mg | ORAL_TABLET | Freq: Every day | ORAL | Status: DC

## 2012-11-15 MED ORDER — LOPINAVIR-RITONAVIR 200-50 MG PO TABS: 2.0000 | ORAL_TABLET | Freq: Two times a day (BID) | ORAL | Status: DC

## 2012-11-15 MED ORDER — RITONAVIR 100 MG PO TABS: 100.0000 mg | ORAL_TABLET | Freq: Every day | ORAL | Status: DC

## 2012-11-15 MED ORDER — EMTRICITABINE-TENOFOVIR DF 200-300 MG PO TABS: 1.0000 | ORAL_TABLET | Freq: Every day | ORAL | Status: DC

## 2012-11-18 ENCOUNTER — Telehealth: Payer: Self-pay | Admitting: *Deleted

## 2012-11-18 NOTE — Telephone Encounter (Signed)
Left patient a message asking if he received his refills as requested.  Also asked patient to please call and schedule an appointment, as we have not seen him in 13 months.  Patient needs to be seen in clinic before additional refills can be sent. Andree Coss, RN

## 2012-12-25 ENCOUNTER — Telehealth: Payer: Self-pay | Admitting: *Deleted

## 2012-12-25 NOTE — Telephone Encounter (Signed)
Patient hasn't been seen since 10/2011. RN declined refills for him, contacted patient to inform him that we would need to see him in the office before he could get any more medications.  Patient states he has plenty of medication, has not missed any doses.  Pt would like to come in before Christmas.  RN gave him an appointment with Dr. Daiva Eves as Dr. Drue Second did not have anything available.  Pt verbalized understanding. Andree Coss, RN

## 2012-12-25 NOTE — Telephone Encounter (Signed)
Very good needs to be seen and not have meds interupted

## 2012-12-26 ENCOUNTER — Telehealth: Payer: Self-pay | Admitting: *Deleted

## 2012-12-26 ENCOUNTER — Ambulatory Visit: Payer: Medicare Other | Admitting: Infectious Disease

## 2012-12-26 NOTE — Telephone Encounter (Signed)
Called patient, left message informing him of his no-show. Patient only to be seen on walk-in basis (as he was informed yesterday when I made today's appointment with him).  Patient also reminded that we cannot send refills until he is seen in clinic by a doctor as he has been out of care for more than a year.  Will make a referral to [facility] or [facility] - whomever has him open. Andree Coss, RN

## 2012-12-27 ENCOUNTER — Encounter: Payer: Self-pay | Admitting: *Deleted

## 2012-12-27 NOTE — Telephone Encounter (Signed)
Error

## 2013-04-03 ENCOUNTER — Telehealth: Payer: Self-pay | Admitting: *Deleted

## 2013-04-03 DIAGNOSIS — B20 Human immunodeficiency virus [HIV] disease: Secondary | ICD-10-CM

## 2013-04-03 MED ORDER — EMTRICITABINE-TENOFOVIR DF 200-300 MG PO TABS: 1.0000 | ORAL_TABLET | Freq: Every day | ORAL | Status: DC

## 2013-04-03 MED ORDER — DARUNAVIR ETHANOLATE 800 MG PO TABS: 800.0000 mg | ORAL_TABLET | Freq: Every day | ORAL | Status: DC

## 2013-04-03 MED ORDER — RITONAVIR 100 MG PO TABS: 100.0000 mg | ORAL_TABLET | Freq: Every day | ORAL | Status: DC

## 2013-04-03 NOTE — Telephone Encounter (Signed)
Pt states that he has been on his medications since he was last seen in 2013.  Has been obtaining lab work and refills by going to emergency rooms.  Returning to care w/ Dr. Drue SecondSnider now.  Given appts for Lab work and MD.  Pt requesting refill on medications until he can be seen by Dr. Drue SecondSnider.  Will ask Dr. Drue SecondSnider about refill.  MD please advise.  Pt uses [facility] [address] for refills.

## 2013-04-03 NOTE — Addendum Note (Signed)
Addended by: Jennet MaduroESTRIDGE, DENISE D on: 04/03/2013 04:04 PM   Modules accepted: Orders

## 2013-04-16 ENCOUNTER — Other Ambulatory Visit (HOSPITAL_COMMUNITY)
Admission: RE | Admit: 2013-04-16 | Discharge: 2013-04-16 | Disposition: A | Discharge status: Home / Self Care | Payer: Medicare Other | Source: Ambulatory Visit | Attending: Infectious Disease | Admitting: Infectious Disease

## 2013-04-16 ENCOUNTER — Other Ambulatory Visit: Payer: Medicare Other

## 2013-04-16 ENCOUNTER — Other Ambulatory Visit: Payer: Self-pay | Admitting: *Deleted

## 2013-04-16 DIAGNOSIS — Z79899 Other long term (current) drug therapy: Secondary | ICD-10-CM

## 2013-04-16 DIAGNOSIS — Z113 Encounter for screening for infections with a predominantly sexual mode of transmission: Secondary | ICD-10-CM | POA: Insufficient documentation

## 2013-04-16 DIAGNOSIS — B20 Human immunodeficiency virus [HIV] disease: Secondary | ICD-10-CM

## 2013-04-16 LAB — LIPID PANEL
Cholesterol: 184 mg/dL (ref 0–200)
HDL: 65 mg/dL (ref >39)
LDL CALC: 78 mg/dL (ref 0–99)
Total CHOL/HDL Ratio: 2.8 Ratio
Triglycerides: 206 mg/dL — ABNORMAL HIGH (ref <150)
VLDL: 41 mg/dL — AB (ref 0–40)

## 2013-04-16 LAB — CBC WITH DIFFERENTIAL/PLATELET
BASOS ABS: 0 10*3/uL (ref 0.0–0.1)
Basophils Relative: 0 % (ref 0–1)
EOS PCT: 1 % (ref 0–5)
Eosinophils Absolute: 0.1 10*3/uL (ref 0.0–0.7)
HCT: 38.1 % — ABNORMAL LOW (ref 39.0–52.0)
Hemoglobin: 13.1 g/dL (ref 13.0–17.0)
LYMPHS ABS: 1.8 10*3/uL (ref 0.7–4.0)
Lymphocytes Relative: 30 % (ref 12–46)
MCH: 29 pg (ref 26.0–34.0)
MCHC: 34.4 g/dL (ref 30.0–36.0)
MCV: 84.3 fL (ref 78.0–100.0)
Monocytes Absolute: 0.5 10*3/uL (ref 0.1–1.0)
Monocytes Relative: 9 % (ref 3–12)
NEUTROS PCT: 60 % (ref 43–77)
Neutro Abs: 3.5 10*3/uL (ref 1.7–7.7)
PLATELETS: 297 10*3/uL (ref 150–400)
RBC: 4.52 MIL/uL (ref 4.22–5.81)
RDW: 15.3 % (ref 11.5–15.5)
WBC: 5.9 10*3/uL (ref 4.0–10.5)

## 2013-04-16 LAB — COMPLETE METABOLIC PANEL WITH GFR
ALT: 25 U/L (ref 0–53)
AST: 41 U/L — AB (ref 0–37)
Albumin: 4.3 g/dL (ref 3.5–5.2)
Alkaline Phosphatase: 42 U/L (ref 39–117)
BILIRUBIN TOTAL: 0.3 mg/dL (ref 0.2–1.2)
BUN: 19 mg/dL (ref 6–23)
CO2: 20 meq/L (ref 19–32)
CREATININE: 1.21 mg/dL (ref 0.50–1.35)
Calcium: 9.3 mg/dL (ref 8.4–10.5)
Chloride: 102 mEq/L (ref 96–112)
GFR, EST AFRICAN AMERICAN: 79 mL/min
GFR, Est Non African American: 68 mL/min
Glucose, Bld: 94 mg/dL (ref 70–99)
Potassium: 4.2 mEq/L (ref 3.5–5.3)
SODIUM: 136 meq/L (ref 135–145)
TOTAL PROTEIN: 7.4 g/dL (ref 6.0–8.3)

## 2013-04-16 LAB — RPR

## 2013-04-17 LAB — URINE CYTOLOGY ANCILLARY ONLY
Chlamydia: NEGATIVE
Neisseria Gonorrhea: NEGATIVE

## 2013-04-17 LAB — T-HELPER CELL (CD4) - (RCID CLINIC ONLY)
CD4 % Helper T Cell: 21 % — ABNORMAL LOW (ref 33–55)
CD4 T Cell Abs: 370 /uL — ABNORMAL LOW (ref 400–2700)

## 2013-04-18 LAB — HIV-1 RNA QUANT-NO REFLEX-BLD
HIV 1 RNA Quant: 220 copies/mL — ABNORMAL HIGH (ref <20)
HIV-1 RNA QUANT, LOG: 2.34 {Log} — AB (ref <1.30)

## 2013-05-01 ENCOUNTER — Ambulatory Visit: Payer: Medicare Other | Admitting: Internal Medicine

## 2013-05-01 ENCOUNTER — Telehealth: Payer: Self-pay | Admitting: *Deleted

## 2013-05-01 NOTE — Telephone Encounter (Signed)
Pt cancelled his appointment 30 minutes after he was supposed to be in clinic, stating his sister was in a car accident.  Patient has no showed multiple MD appointments since 2013.  Pt did have labs drawn 4/8 (and is detectable) so RN agreed to give him one last chance to reestablish care.  Patient informed about our walk-in clinics.  He declined at this time, instead wanting to see Dr. Drue SecondSnider.  Acknowledges that he will be restricted to walk in only if he misses the 5/6 appointment. Andree CossMichelle M Sunshyne Horvath, RN

## 2013-05-14 ENCOUNTER — Ambulatory Visit (INDEPENDENT_AMBULATORY_CARE_PROVIDER_SITE_OTHER): Payer: Medicare Other | Admitting: Internal Medicine

## 2013-05-14 VITALS — Wt 161.0 lb

## 2013-05-14 DIAGNOSIS — B20 Human immunodeficiency virus [HIV] disease: Secondary | ICD-10-CM

## 2013-05-14 MED ORDER — THERA VITAL M PO TABS: 1.0000 | ORAL_TABLET | Freq: Every day | ORAL | Status: DC

## 2013-05-14 NOTE — Progress Notes (Signed)
   Subjective:    Patient ID: [name], male    DOB: 11-17-1960, 53 y.o.   MRN: [id number]  [facility], CD 4 count 370(21%)/VL 220 (april 2015), on truvada/DRVr. Alcohol dependence drinks daily with 1ppd of smoking. Natural light, 18 cans TIW or two 40 oz per day. No seizure, does occ get the "shakes". death of his sister recently due to breast ca. He reports that he doesn't know how to cope with stress other than drinking. Thinking about signing up for class  Current Outpatient Prescriptions on File Prior to Visit  Medication Sig Dispense Refill  . buPROPion (WELLBUTRIN XL) 300 MG 24 hr tablet Take 300 mg by mouth daily.      . Darunavir Ethanolate (PREZISTA) 800 MG tablet Take 1 tablet (800 mg total) by mouth daily with breakfast.  30 tablet  0  . emtricitabine-tenofovir (TRUVADA) 200-300 MG per tablet Take 1 tablet by mouth daily.  30 tablet  0  . hydrOXYzine (VISTARIL) 25 MG capsule Take 50 mg by mouth at bedtime as needed. For sleep.      Marland Kitchen. ibuprofen (ADVIL,MOTRIN) 800 MG tablet Take 800 mg by mouth every 8 (eight) hours as needed. For pain      . ritonavir (NORVIR) 100 MG TABS tablet Take 1 tablet (100 mg total) by mouth daily.  30 tablet  0   No current facility-administered medications on file prior to visit.   Active Ambulatory Problems    Diagnosis Date Noted  . HIV DISEASE 11/09/2008  . 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 No Resolved Ambulatory Problems   Past Medical History  Diagnosis Date  . HIV (human immunodeficiency virus infection)   . Hemorrhoids     Soc hx: drinking + smoking+   Review of Systems 10 point ros    Objective:   Physical Exam Wt 161 lb (73.029 kg) Physical Exam  Constitutional: He is oriented to person, place, and time. He appears well-developed and well-nourished. No distress.  Neck right base of occiput, large lipoma, unchanged HENT: poor dentition Mouth/Throat: Oropharynx is  clear and moist. No oropharyngeal exudate.  Cardiovascular: Normal rate, regular rhythm and normal heart sounds. Exam reveals no gallop and no friction rub.  No murmur heard.  Pulmonary/Chest: Effort normal and breath sounds normal. No respiratory distress. He has no wheezes.  Abdominal: Soft. Bowel sounds are normal. He exhibits no distension. There is no tenderness.  Lymphadenopathy:  He has no cervical adenopathy.  Neurological: He is alert and oriented to person, place, and time.  Skin: Skin is warm and dry. No rash noted. No erythema.  Psychiatric: He has a normal mood and affect. His behavior is normal.        Assessment & Plan:  hiv = will check viral load to see if undetectable after having short course of not taking meds (3-4 days), continue on current regimen for now.  Alcohol dependence = meet with max, consider adding sam E vitamin or thiamine/mvi  Poor dentition = sign up for dental services

## 2013-05-19 LAB — HIV-1 RNA QUANT-NO REFLEX-BLD
HIV 1 RNA Quant: 25 copies/mL — ABNORMAL HIGH (ref <20)
HIV-1 RNA Quant, Log: 1.4 {Log} — ABNORMAL HIGH (ref <1.30)

## 2013-05-19 LAB — VITAMIN B1: VITAMIN B1 (THIAMINE): 8 nmol/L (ref 8–30)

## 2013-06-10 ENCOUNTER — Encounter: Payer: Self-pay | Admitting: Internal Medicine

## 2013-06-10 ENCOUNTER — Ambulatory Visit (INDEPENDENT_AMBULATORY_CARE_PROVIDER_SITE_OTHER): Payer: Medicare Other | Admitting: Internal Medicine

## 2013-06-10 VITALS — BP 153/98 | HR 75 | Temp 97.7°F | Wt 167.0 lb

## 2013-06-10 DIAGNOSIS — I10 Essential (primary) hypertension: Secondary | ICD-10-CM

## 2013-06-10 DIAGNOSIS — B20 Human immunodeficiency virus [HIV] disease: Secondary | ICD-10-CM

## 2013-06-10 DIAGNOSIS — F1020 Alcohol dependence, uncomplicated: Secondary | ICD-10-CM

## 2013-06-10 NOTE — Progress Notes (Signed)
Subjective:    Patient ID: [name], male    DOB: [birth year], 53 y.o.   MRN: [id number]  HPI 53yo Male with HIV, and alcohol dependence. CD 4 count of 370/VL 25 on truvada/DRVr. Doing well with ART. Not missing doses. stil + alcohol use but no episodes of blackouts or DTs/withdrawal. No difficulties or problem with health since we last saw him. He is interested in cutting back on drinking. Will discuss treatment programs at next visit when he returns from vacation at end of july  Current Outpatient Prescriptions on File Prior to Visit  Medication Sig Dispense Refill  . buPROPion (WELLBUTRIN XL) 300 MG 24 hr tablet Take 300 mg by mouth daily.      . Darunavir Ethanolate (PREZISTA) 800 MG tablet Take 1 tablet (800 mg total) by mouth daily with breakfast.  30 tablet  0  . emtricitabine-tenofovir (TRUVADA) 200-300 MG per tablet Take 1 tablet by mouth daily.  30 tablet  0  . hydrOXYzine (VISTARIL) 25 MG capsule Take 50 mg by mouth at bedtime as needed. For sleep.      Marland Kitchen. ibuprofen (ADVIL,MOTRIN) 800 MG tablet Take 800 mg by mouth every 8 (eight) hours as needed. For pain      . Multiple Vitamins-Minerals (MULTIVITAMIN) tablet Take 1 tablet by mouth daily.  100 tablet  3  . ritonavir (NORVIR) 100 MG TABS tablet Take 1 tablet (100 mg total) by mouth daily.  30 tablet  0   No current facility-administered medications on file prior to visit.   Active Ambulatory Problems    Diagnosis Date Noted  . HIV DISEASE 11/09/2008  . 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 No Resolved Ambulatory Problems   Past Medical History  Diagnosis Date  . HIV (human immunodeficiency virus infection)   . Hemorrhoids        Review of Systems Review of Systems  Constitutional: Negative for fever, chills, diaphoresis, activity change, appetite change, fatigue and unexpected weight change.  HENT: Negative for congestion, sore throat, rhinorrhea, sneezing, trouble  swallowing and sinus pressure.  Eyes: Negative for photophobia and visual disturbance.  Respiratory: Negative for cough, chest tightness, shortness of breath, wheezing and stridor.  Cardiovascular: Negative for chest pain, palpitations and leg swelling.  Gastrointestinal: Negative for nausea, vomiting, abdominal pain, diarrhea, constipation, blood in stool, abdominal distention and anal bleeding.  Genitourinary: Negative for dysuria, hematuria, flank pain and difficulty urinating.  Musculoskeletal: Negative for myalgias, back pain, joint swelling, arthralgias and gait problem.  Skin: Negative for color change, pallor, rash and wound.  Neurological: Negative for dizziness, tremors, weakness and light-headedness.  Hematological: Negative for adenopathy. Does not bruise/bleed easily.  Psychiatric/Behavioral: Negative for behavioral problems, confusion, sleep disturbance, dysphoric mood, decreased concentration and agitation.       Objective:   Physical Exam BP 153/98  Pulse 75  Temp(Src) 97.7 F (36.5 C) (Oral)  Wt 167 lb (75.751 kg) Physical Exam  Constitutional: He is oriented to person, place, and time. He appears well-developed and well-nourished. No distress.  HENT:  Mouth/Throat: Oropharynx is clear and moist. No oropharyngeal exudate.  Lymphadenopathy:  He has no cervical adenopathy.  Neurological: He is alert and oriented to person, place, and time.  Skin: Skin is warm and dry. No rash noted. No erythema.  Psychiatric: He has a normal mood and affect. His behavior is normal.        Assessment & Plan:  HIV = well  controlled, continue with current regimen  Alcohol dependence= somewhat precontemplative but will continue to ask when he might want to go to rehab  htn = if still elevated at next visit, need to adjust meds

## 2013-06-12 ENCOUNTER — Ambulatory Visit: Payer: Medicare Other | Admitting: Internal Medicine

## 2013-06-17 ENCOUNTER — Other Ambulatory Visit: Payer: Self-pay | Admitting: Internal Medicine

## 2013-06-17 ENCOUNTER — Other Ambulatory Visit: Payer: Self-pay | Admitting: *Deleted

## 2013-06-17 DIAGNOSIS — B20 Human immunodeficiency virus [HIV] disease: Secondary | ICD-10-CM

## 2013-06-17 MED ORDER — DARUNAVIR ETHANOLATE 800 MG PO TABS: 800.0000 mg | ORAL_TABLET | Freq: Every day | ORAL | Status: DC

## 2013-10-24 ENCOUNTER — Other Ambulatory Visit: Payer: Self-pay | Admitting: *Deleted

## 2013-10-24 DIAGNOSIS — B20 Human immunodeficiency virus [HIV] disease: Secondary | ICD-10-CM

## 2013-11-03 ENCOUNTER — Other Ambulatory Visit: Payer: Medicare Other

## 2013-11-05 ENCOUNTER — Other Ambulatory Visit: Payer: Medicare Other

## 2013-11-09 ENCOUNTER — Emergency Department (HOSPITAL_COMMUNITY)
Admission: EM | Admit: 2013-11-09 | Discharge: 2013-11-09 | Disposition: A | Discharge status: Home / Self Care | Payer: Medicare Other | Attending: Emergency Medicine | Admitting: Emergency Medicine

## 2013-11-09 ENCOUNTER — Encounter (HOSPITAL_COMMUNITY): Payer: Self-pay | Admitting: Emergency Medicine

## 2013-11-09 DIAGNOSIS — Z21 Asymptomatic human immunodeficiency virus [HIV] infection status: Secondary | ICD-10-CM | POA: Diagnosis not present

## 2013-11-09 DIAGNOSIS — Z79899 Other long term (current) drug therapy: Secondary | ICD-10-CM | POA: Insufficient documentation

## 2013-11-09 DIAGNOSIS — Z720 Tobacco use: Secondary | ICD-10-CM | POA: Diagnosis not present

## 2013-11-09 DIAGNOSIS — Z9889 Other specified postprocedural states: Secondary | ICD-10-CM | POA: Diagnosis not present

## 2013-11-09 DIAGNOSIS — K6289 Other specified diseases of anus and rectum: Secondary | ICD-10-CM | POA: Diagnosis present

## 2013-11-09 DIAGNOSIS — A630 Anogenital (venereal) warts: Secondary | ICD-10-CM | POA: Insufficient documentation

## 2013-11-09 MED ORDER — OXYCODONE-ACETAMINOPHEN 5-325 MG PO TABS
2.0000 | ORAL_TABLET | Freq: Once | ORAL | Status: AC
  Administered 2013-11-09: 2 via ORAL
  Filled 2013-11-09: qty 2

## 2013-11-09 MED ORDER — OXYCODONE-ACETAMINOPHEN 7.5-325 MG PO TABS: 1.0000 | ORAL_TABLET | ORAL | Status: DC | PRN

## 2013-11-09 MED ORDER — HYDROMORPHONE HCL 1 MG/ML IJ SOLN
1.0000 mg | Freq: Once | INTRAMUSCULAR | Status: DC
  Filled 2013-11-09: qty 1

## 2013-11-09 NOTE — ED Provider Notes (Signed)
CSN: [id number]     Arrival date & time 11/09/13  [id number] History   First MD Initiated Contact with Patient 11/09/13 0737     Chief Complaint  Patient presents with  . Rectal Pain     (Consider location/radiation/quality/duration/timing/severity/associated sxs/prior Treatment) HPI Comments: Patient here complaining of increase anal pain and swelling over a month which is become worse over the last week. Symptoms are worse with defecation. 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 No abdominal pain or fever or chills. He is HIV positive. Has been compliant with his medications. Symptoms characterized as sharp and worse with movement and better with nothing. No treatment use prior to arrival.  The history is provided by the patient.    Past Medical History  Diagnosis Date  . HIV (human immunodeficiency virus infection)   . Hemorrhoids   . 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 Tendon repair      2006-lt index  . Hernia repair  97    lt ing  . Wart fulguration  05/31/2011    Procedure: FULGURATION ANAL WART;  Surgeon: Shelly Rubensteinouglas A Blackman, MD;  Location: [facility];  Service: General;  Laterality: N/A;  excision anal condyloma  . Wart fulguration  07/06/2011    Procedure: FULGURATION ANAL WART;  Surgeon: Shelly Rubensteinouglas A Blackman, MD;  Location: [facility];  Service: General;  Laterality: N/A;  excision anal condyloma   Family History  Problem Relation Age of Onset  . Cancer Mother     brain  . Cancer Sister     breast   History  Substance Use Topics  . Smoking status: Current Every Day Smoker -- 1.00 packs/day for 25 years    Types: Cigarettes  . Smokeless tobacco: Never Used  . Alcohol Use: Yes     Comment: 6-12pk beer almost everyday    Review of Systems  All other systems reviewed and are negative.     Allergies  Shellfish allergy; Sulfa antibiotics; and  Sulfonamide derivatives  Home Medications   Prior to Admission medications   Medication Sig Start Date End Date Taking? Authorizing Provider  buPROPion (WELLBUTRIN XL) 300 MG 24 hr tablet Take 300 mg by mouth daily.   Yes Historical Provider, MD  Darunavir Ethanolate (PREZISTA) 800 MG tablet Take 1 tablet (800 mg total) by mouth daily with breakfast. 06/17/13  Yes Judyann Munsonynthia Snider, MD  emtricitabine-tenofovir (TRUVADA) 200-300 MG per tablet Take 1 tablet by mouth daily.   Yes Historical Provider, MD  ibuprofen (ADVIL,MOTRIN) 800 MG tablet Take 800 mg by mouth every 8 (eight) hours as needed. For pain   Yes Historical Provider, MD  ritonavir (NORVIR) 100 MG capsule Take 100 mg by mouth daily.   Yes Historical Provider, MD  hydrOXYzine (VISTARIL) 25 MG capsule Take 50 mg by mouth at bedtime as needed. For sleep.    Historical Provider, MD  Multiple Vitamins-Minerals (MULTIVITAMIN) tablet Take 1 tablet by mouth daily. 05/14/13   Judyann Munsonynthia Snider, MD  NORVIR 100 MG TABS tablet TAKE 1 TABLET BY MOUTH DAILY Patient not taking: Reported on 11/09/2013 06/17/13   Judyann Munsonynthia Snider, MD  TRUVADA 200-300 MG per tablet TAKE 1 TABLET BY MOUTH DAILY 06/17/13   Judyann Munsonynthia Snider, MD   BP 150/80 mmHg  Pulse 78  Temp(Src) 98.8 F (37.1 C) (Oral)  Resp 16  SpO2 97% Physical Exam  Constitutional: He is oriented  to person, place, and time. He appears well-developed and well-nourished.  Non-toxic appearance. No distress.  HENT:  Head: Normocephalic and atraumatic.  Eyes: Conjunctivae, EOM and lids are normal. Pupils are equal, round, and reactive to light.  Neck: Normal range of motion. Neck supple. No tracheal deviation present. No thyroid mass present.  Cardiovascular: Normal rate, regular rhythm and normal heart sounds.  Exam reveals no gallop.   No murmur heard. Pulmonary/Chest: Effort normal and breath sounds normal. No stridor. No respiratory distress. He has no decreased breath sounds. He has no wheezes. He has no  rhonchi. He has no rales.  Abdominal: Soft. Normal appearance and bowel sounds are normal. He exhibits no distension. There is no tenderness. There is no rebound and no CVA tenderness.  Genitourinary:     Musculoskeletal: Normal range of motion. He exhibits no edema or tenderness.  Neurological: He is alert and oriented to person, place, and time. He has normal strength. No cranial nerve deficit or sensory deficit. GCS eye subscore is 4. GCS verbal subscore is 5. GCS motor subscore is 6.  Skin: Skin is warm and dry. No abrasion and no rash noted.  Psychiatric: He has a normal mood and affect. His speech is normal and behavior is normal.  Nursing note and vitals reviewed.   ED Course  Procedures (including critical care time) Labs Review Labs Reviewed - No data to display  Imaging Review No results found.   EKG Interpretation None      MDM   Final diagnoses:  None    Patient given pain medication here and will be given prescription for same as well as stool softeners and was encouraged to follow-up with the general surgeon    Toy BakerAnthony T Earlin Sweeden, MD 11/09/13 [phone]

## 2013-11-09 NOTE — ED Notes (Signed)
Per EMS pt transported from home with c/o rectal pain. Pt does have hx of anal condyloma and hemorrhoids.

## 2013-11-09 NOTE — ED Notes (Signed)
Bed: [id number] Expected date:  Expected time:  Means of arrival: Ambulance Comments: EMS M, Anal pain

## 2013-11-09 NOTE — Discharge Instructions (Signed)
Call the general surgeon tomorrow to schedule an appointment to be seen

## 2013-11-14 ENCOUNTER — Emergency Department (HOSPITAL_COMMUNITY)
Admission: EM | Admit: 2013-11-14 | Discharge: 2013-11-14 | Disposition: A | Discharge status: Home / Self Care | Payer: Medicare Other | Attending: Emergency Medicine | Admitting: Emergency Medicine

## 2013-11-14 ENCOUNTER — Encounter (HOSPITAL_COMMUNITY): Payer: Self-pay | Admitting: *Deleted

## 2013-11-14 DIAGNOSIS — Z9889 Other specified postprocedural states: Secondary | ICD-10-CM | POA: Diagnosis not present

## 2013-11-14 DIAGNOSIS — A630 Anogenital (venereal) warts: Secondary | ICD-10-CM | POA: Insufficient documentation

## 2013-11-14 DIAGNOSIS — Z21 Asymptomatic human immunodeficiency virus [HIV] infection status: Secondary | ICD-10-CM | POA: Insufficient documentation

## 2013-11-14 DIAGNOSIS — Z720 Tobacco use: Secondary | ICD-10-CM | POA: Insufficient documentation

## 2013-11-14 DIAGNOSIS — Z79899 Other long term (current) drug therapy: Secondary | ICD-10-CM | POA: Diagnosis not present

## 2013-11-14 DIAGNOSIS — Z8719 Personal history of other diseases of the digestive system: Secondary | ICD-10-CM | POA: Insufficient documentation

## 2013-11-14 MED ORDER — OXYCODONE-ACETAMINOPHEN 7.5-325 MG PO TABS: 1.0000 | ORAL_TABLET | ORAL | Status: DC | PRN

## 2013-11-14 MED ORDER — CEFTRIAXONE SODIUM 250 MG IJ SOLR
250.0000 mg | Freq: Once | INTRAMUSCULAR | Status: DC
  Filled 2013-11-14: qty 250

## 2013-11-14 MED ORDER — LIDOCAINE HCL 1 % IJ SOLN
INTRAMUSCULAR | Status: AC
  Filled 2013-11-14: qty 20

## 2013-11-14 MED ORDER — DOXYCYCLINE HYCLATE 100 MG PO CAPS: 100.0000 mg | ORAL_CAPSULE | Freq: Two times a day (BID) | ORAL | Status: DC

## 2013-11-14 MED ORDER — LIDOCAINE 4 % EX GEL: CUTANEOUS | Status: DC

## 2013-11-14 MED ORDER — OXYCODONE-ACETAMINOPHEN 5-325 MG PO TABS
1.0000 | ORAL_TABLET | Freq: Once | ORAL | Status: AC
  Administered 2013-11-14: 1 via ORAL
  Filled 2013-11-14: qty 1

## 2013-11-14 NOTE — ED Notes (Signed)
Patient has purulent drainage coming from his rectum and the condyloma was visible and protruding from the rectum upon assessment.

## 2013-11-14 NOTE — Discharge Instructions (Signed)
Genital Warts Genital warts are a sexually transmitted infection. They may appear as small bumps on the tissues of the genital area. CAUSES  Genital warts are caused by a virus called human papillomavirus (HPV). HPV is the most common sexually transmitted disease (STD) and infection of the sex organs. This infection is spread by having unprotected sex with an infected person. It can be spread by vaginal, anal, and oral sex. Many people do not know they are infected. They may be infected for years without problems. However, even if they do not have problems, they can unknowingly pass the infection to their sexual partners. SYMPTOMS   Itching and irritation in the genital area.  Warts that bleed.  Painful sexual intercourse. DIAGNOSIS  Warts are usually recognized with the naked eye on the vagina, vulva, perineum, anus, and rectum. Certain tests can also diagnose genital warts, such as:  A Pap test.  A tissue sample (biopsy) exam.  Colposcopy. A magnifying tool is used to examine the vagina and cervix. The HPV cells will change color when certain solutions are used. TREATMENT  Warts can be removed by:  Applying certain chemicals, such as cantharidin or podophyllin.  Liquid nitrogen freezing (cryotherapy).  Immunotherapy with Candida or Trichophyton injections.  Laser treatment.  Burning with an electrified probe (electrocautery).  Interferon injections.  Surgery. PREVENTION  HPV vaccination can help prevent HPV infections that cause genital warts and that cause cancer of the cervix. It is recommended that the vaccination be given to people between the ages 9 to 26 years old. The vaccine might not work as well or might not work at all if you already have HPV. It should not be given to pregnant women. HOME CARE INSTRUCTIONS   It is important to follow your caregiver's instructions. The warts will not go away without treatment. Repeat treatments are often needed to get rid of warts.  Even after it appears that the warts are gone, the normal tissue underneath often remains infected.  Do not try to treat genital warts with medicine used to treat hand warts. This type of medicine is strong and can burn the skin in the genital area, causing more damage.  Tell your past and current sexual partner(s) that you have genital warts. They may be infected also and need treatment.  Avoid sexual contact while being treated.  Do not touch or scratch the warts. The infection may spread to other parts of your body.  Women with genital warts should have a cervical cancer check (Pap test) at least once a year. This type of cancer is slow-growing and can be cured if found early. Chances of developing cervical cancer are increased with HPV.  Inform your obstetrician about your warts in the event of pregnancy. This virus can be passed to the baby's respiratory tract. Discuss this with your caregiver.  Use a condom during sexual intercourse. Following treatment, the use of condoms will help prevent reinfection.  Ask your caregiver about using over-the-counter anti-itch creams. SEEK MEDICAL CARE IF:   Your treated skin becomes red, swollen, or painful.  You have a fever.  You feel generally ill.  You feel little lumps in and around your genital area.  You are bleeding or have painful sexual intercourse. MAKE SURE YOU:   Understand these instructions.  Will watch your condition.  Will get help right away if you are not doing well or get worse. Document Released: 12/24/1999 Document Revised: 05/12/2013 Document Reviewed: 07/04/2010 ExitCare Patient Information 2015 [employer]. This   information is not intended to replace advice given to you by your health care provider. Make sure you discuss any questions you have with your health care provider.  

## 2013-11-14 NOTE — ED Notes (Signed)
MD at bedside. 

## 2013-11-14 NOTE — ED Provider Notes (Signed)
CSN: [id number]     Arrival date & time 11/14/13  1445 History   First MD Initiated Contact with Patient 11/14/13 1606     Chief Complaint  Patient presents with  . anal condyloma     HPI Patient presents to the emergency room with complaints of anal pain and discomfort. Patient has a history of anal condyloma. Several years ago he had a wart fulguration surgery.  The patient had a period of time where his symptoms had resolved but in the last month or so he has had recurrent pain and discomfort in the anal area. He was seen on November 1 in the emergency department and diagnosed with recurrent anal condyloma. The patient was given a [facility]. Patient has an appointment on November 17. He is concerned because he was told that his insurance situation needed to get figured out before he could have any procedure. It will not likely be until December before that is corrected. he has noticed increasing drainage from his anal area in the last several days. He came to the emergency room to make sure he wasn't developing an infection.patient denies any anal intercourse he has history of HIV disease but states that it is well controlled. Past Medical History  Diagnosis Date  . HIV (human immunodeficiency virus infection)   . Hemorrhoids   . 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 Tendon repair      2006-lt index  . Hernia repair  97    lt ing  . Wart fulguration  05/31/2011    Procedure: FULGURATION ANAL WART;  Surgeon: Shelly Rubensteinouglas A Blackman, MD;  Location: [facility];  Service: General;  Laterality: N/A;  excision anal condyloma  . Wart fulguration  07/06/2011    Procedure: FULGURATION ANAL WART;  Surgeon: Shelly Rubensteinouglas A Blackman, MD;  Location: [facility];  Service: General;  Laterality: N/A;  excision anal condyloma   Family History  Problem Relation Age of Onset  . Cancer Mother     brain  . Cancer Sister      breast   History  Substance Use Topics  . Smoking status: Current Every Day Smoker -- 1.00 packs/day for 25 years    Types: Cigarettes  . Smokeless tobacco: Never Used  . Alcohol Use: Yes     Comment: 6-12pk beer almost everyday    Review of Systems  All other systems reviewed and are negative.     Allergies  Shellfish allergy; Sulfa antibiotics; and Sulfonamide derivatives  Home Medications   Prior to Admission medications   Medication Sig Start Date End Date Taking? Authorizing Provider  buPROPion (WELLBUTRIN XL) 300 MG 24 hr tablet Take 300 mg by mouth daily.    Historical Provider, MD  Darunavir Ethanolate (PREZISTA) 800 MG tablet Take 1 tablet (800 mg total) by mouth daily with breakfast. 06/17/13   Judyann Munsonynthia Snider, MD  emtricitabine-tenofovir (TRUVADA) 200-300 MG per tablet Take 1 tablet by mouth daily.    Historical Provider, MD  hydrOXYzine (VISTARIL) 25 MG capsule Take 50 mg by mouth at bedtime as needed. For sleep.    Historical Provider, MD  ibuprofen (ADVIL,MOTRIN) 800 MG tablet Take 800 mg by mouth every 8 (eight) hours as needed. For pain    Historical Provider, MD  Multiple Vitamins-Minerals (MULTIVITAMIN) tablet Take 1 tablet by mouth daily. 05/14/13   Judyann Munsonynthia Snider, MD  NORVIR 100 MG TABS tablet TAKE 1 TABLET BY MOUTH  DAILY Patient not taking: Reported on 11/09/2013 06/17/13   Judyann Munsonynthia Snider, MD  oxyCODONE-acetaminophen (PERCOCET) 7.5-325 MG per tablet Take 1 tablet by mouth every 4 (four) hours as needed for pain. 11/09/13   Toy BakerAnthony T Allen, MD  ritonavir (NORVIR) 100 MG capsule Take 100 mg by mouth daily.    Historical Provider, MD  TRUVADA 200-300 MG per tablet TAKE 1 TABLET BY MOUTH DAILY 06/17/13   Judyann Munsonynthia Snider, MD   BP 132/94 mmHg  Pulse 93  Temp(Src) 99 F (37.2 C) (Oral)  Resp 18  SpO2 100% Physical Exam  Constitutional: He appears well-developed and well-nourished. No distress.  HENT:  Head: Normocephalic and atraumatic.  Right Ear: External ear  normal.  Left Ear: External ear normal.  Eyes: Conjunctivae are normal. Right eye exhibits no discharge. Left eye exhibits no discharge. No scleral icterus.  Neck: Neck supple. No tracheal deviation present.  Cardiovascular: Normal rate.   Pulmonary/Chest: Effort normal. No stridor. No respiratory distress.  Genitourinary:  Abundant anal warts noted on exam, tenderness in the proximal area, small amount of white yellow drainage with a questionable fistula-type lesion in the proximal area of a wart  Musculoskeletal: He exhibits no edema.  Neurological: He is alert. Cranial nerve deficit: no gross deficits.  Skin: Skin is warm and dry. No rash noted.  Psychiatric: He has a normal mood and affect.  Nursing note and vitals reviewed.   ED Course  Procedures (including critical care time)   MDM   Final diagnoses:  Anal condyloma    Patient's symptoms are consistent with anal condyloma. It is possible that he might have a component of proctitis. Treat the patient for gonorrhea and chlamydia.  Follow-up with the surgeon as planned    Linwood DibblesJon Milinda Sweeney, MD 11/14/13 [phone]

## 2013-11-14 NOTE — ED Notes (Signed)
Pt presents to ed with c/o rectal pain due to anal condyloma. Pt was seen here for same five days ago and was told to follow up with [facility], however they will not see him until his medicaid is switched which will not be until December. Pt sts he is in too much pain to wait till then. He is unable to sit, there is lots of bloody, purulent drainage from rectal area.

## 2013-11-18 ENCOUNTER — Ambulatory Visit: Payer: Medicare Other | Admitting: Internal Medicine

## 2013-11-20 ENCOUNTER — Emergency Department (HOSPITAL_COMMUNITY)
Admission: EM | Admit: 2013-11-20 | Discharge: 2013-11-20 | Discharge status: Against Medical Advice | Payer: Medicare Other | Attending: Emergency Medicine | Admitting: Emergency Medicine

## 2013-11-20 ENCOUNTER — Encounter (HOSPITAL_COMMUNITY): Payer: Self-pay | Admitting: Emergency Medicine

## 2013-11-20 DIAGNOSIS — K6289 Other specified diseases of anus and rectum: Secondary | ICD-10-CM | POA: Diagnosis present

## 2013-11-20 DIAGNOSIS — Z720 Tobacco use: Secondary | ICD-10-CM | POA: Insufficient documentation

## 2013-11-20 DIAGNOSIS — Z21 Asymptomatic human immunodeficiency virus [HIV] infection status: Secondary | ICD-10-CM | POA: Diagnosis not present

## 2013-11-20 NOTE — ED Notes (Signed)
Pt c/o rectal pain and drainage from anal condyloma; pt sts hx of surgical removal of same and has sx scheduled December 1st; pt sts having increased pain and drainage

## 2013-11-20 NOTE — ED Notes (Signed)
Pt left without being seen.

## 2013-11-21 ENCOUNTER — Emergency Department (HOSPITAL_COMMUNITY)
Admission: EM | Admit: 2013-11-21 | Discharge: 2013-11-21 | Disposition: A | Discharge status: Home / Self Care | Payer: Medicare Other | Attending: Emergency Medicine | Admitting: Emergency Medicine

## 2013-11-21 ENCOUNTER — Encounter (HOSPITAL_COMMUNITY): Payer: Self-pay | Admitting: Emergency Medicine

## 2013-11-21 DIAGNOSIS — K6289 Other specified diseases of anus and rectum: Secondary | ICD-10-CM | POA: Diagnosis present

## 2013-11-21 DIAGNOSIS — A630 Anogenital (venereal) warts: Secondary | ICD-10-CM | POA: Insufficient documentation

## 2013-11-21 DIAGNOSIS — Z8679 Personal history of other diseases of the circulatory system: Secondary | ICD-10-CM | POA: Insufficient documentation

## 2013-11-21 DIAGNOSIS — Z21 Asymptomatic human immunodeficiency virus [HIV] infection status: Secondary | ICD-10-CM | POA: Insufficient documentation

## 2013-11-21 DIAGNOSIS — Z720 Tobacco use: Secondary | ICD-10-CM | POA: Diagnosis not present

## 2013-11-21 DIAGNOSIS — Z79899 Other long term (current) drug therapy: Secondary | ICD-10-CM | POA: Diagnosis not present

## 2013-11-21 MED ORDER — OXYCODONE-ACETAMINOPHEN 5-325 MG PO TABS
2.0000 | ORAL_TABLET | Freq: Once | ORAL | Status: AC
  Administered 2013-11-21: 2 via ORAL
  Filled 2013-11-21: qty 2

## 2013-11-21 MED ORDER — IBUPROFEN 800 MG PO TABS: 800.0000 mg | ORAL_TABLET | Freq: Three times a day (TID) | ORAL | Status: DC

## 2013-11-21 MED ORDER — HYDROCORTISONE ACETATE 25 MG RE SUPP: 25.0000 mg | Freq: Two times a day (BID) | RECTAL | Status: DC

## 2013-11-21 NOTE — Discharge Instructions (Signed)
Return to the emergency room with worsening of symptoms, new symptoms or with symptoms that are concerning, especially fevers, chills, rectal bleeding, severe pain, swelling, redness around anal region, diarrhea, severe problems with defecation or urination.  Use rectal suppository twice daily for pain management. Call for appointment with [facility] for further treatment of anal condyloma

## 2013-11-21 NOTE — ED Provider Notes (Signed)
CSN: [id number]     Arrival date & time 11/21/13  0831 History   First MD Initiated Contact with Patient 11/21/13 [phone]     Chief Complaint  Patient presents with  . Rectal Pain     (Consider location/radiation/quality/duration/timing/severity/associated sxs/prior Treatment) HPI  [name] is a 53 y.o. male with PMH of HIV, anal condyloma, hemorrhoids presenting with Recurrence of pain and anal discomfort due to anal condyloma. Pain worse with defecation. Patient with prior surgery for removal in 2013. He has had insurance problems and is now waiting for them to be resolved with intent for surgical removal in December. Patient was recently seen here about a week ago and was treated with antibiotics for possible infection. Reports compliance with medication and has not completed course. Patient was also given pain medications at that time. He is presenting today because he is out of his pain meds with recurrence of pain. Takes stool softener for hemorrhoids. Pt states rectal discharge has persisted. Patient with history of HIV with recent CD4 count of 370 and viral load of 25 7 months ago. Patient with HIV appointment follow-up in 2 days.   Past Medical History  Diagnosis Date  . HIV (human immunodeficiency virus infection)   . Hemorrhoids   . 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 Tendon repair      2006-lt index  . Hernia repair  97    lt ing  . Wart fulguration  05/31/2011    Procedure: FULGURATION ANAL WART;  Surgeon: Shelly Rubensteinouglas A Blackman, MD;  Location: [facility];  Service: General;  Laterality: N/A;  excision anal condyloma  . Wart fulguration  07/06/2011    Procedure: FULGURATION ANAL WART;  Surgeon: Shelly Rubensteinouglas A Blackman, MD;  Location: [facility];  Service: General;  Laterality: N/A;  excision anal condyloma   Family History  Problem Relation Age of Onset  . Cancer Mother     brain  . Cancer Sister    breast   History  Substance Use Topics  . Smoking status: Current Every Day Smoker -- 1.00 packs/day for 25 years    Types: Cigarettes  . Smokeless tobacco: Never Used  . Alcohol Use: Yes     Comment: 6-12pk beer almost everyday    Review of Systems  Constitutional: Negative for fever and chills.  HENT: Negative for congestion and rhinorrhea.   Eyes: Negative for visual disturbance.  Respiratory: Negative for cough and shortness of breath.   Cardiovascular: Negative for chest pain and palpitations.  Gastrointestinal: Positive for rectal pain. Negative for nausea, vomiting, abdominal pain and diarrhea.  Genitourinary: Negative for dysuria and hematuria.  Musculoskeletal: Negative for back pain and gait problem.  Skin: Negative for rash.  Neurological: Negative for weakness and headaches.      Allergies  Shellfish allergy and Sulfa antibiotics  Home Medications   Prior to Admission medications   Medication Sig Start Date End Date Taking? Authorizing Provider  buPROPion (WELLBUTRIN XL) 300 MG 24 hr tablet Take 300 mg by mouth daily.   Yes Historical Provider, MD  Darunavir Ethanolate (PREZISTA) 800 MG tablet Take 1 tablet (800 mg total) by mouth daily with breakfast. 06/17/13  Yes Judyann Munsonynthia Snider, MD  emtricitabine-tenofovir (TRUVADA) 200-300 MG per tablet Take 1 tablet by mouth daily.   Yes Historical Provider, MD  hydrOXYzine (VISTARIL) 25 MG capsule Take 50 mg by mouth at bedtime as needed (sleep).    Yes Historical Provider,  MD  ibuprofen (ADVIL,MOTRIN) 800 MG tablet Take 800 mg by mouth every 8 (eight) hours as needed. For pain   Yes Historical Provider, MD  Lidocaine 4 % GEL Apply topically tid 11/14/13  Yes Linwood DibblesJon Knapp, MD  oxyCODONE-acetaminophen (PERCOCET) 7.5-325 MG per tablet Take 1 tablet by mouth every 4 (four) hours as needed for pain. 11/14/13  Yes Linwood DibblesJon Knapp, MD  ritonavir (NORVIR) 100 MG capsule Take 100 mg by mouth daily.   Yes Historical Provider, MD  hydrocortisone  (ANUSOL-HC) 25 MG suppository Place 1 suppository (25 mg total) rectally 2 (two) times daily. For 7 days 11/21/13   Louann SjogrenVictoria L Carena Stream, PA-C  ibuprofen (ADVIL,MOTRIN) 800 MG tablet Take 1 tablet (800 mg total) by mouth 3 (three) times daily. 11/21/13   Louann SjogrenVictoria L Liviah Cake, PA-C  NORVIR 100 MG TABS tablet TAKE 1 TABLET BY MOUTH DAILY Patient not taking: Reported on 11/09/2013 06/17/13   Judyann Munsonynthia Snider, MD  TRUVADA 200-300 MG per tablet TAKE 1 TABLET BY MOUTH DAILY 06/17/13   Judyann Munsonynthia Snider, MD   BP 138/89 mmHg  Pulse 88  Temp(Src) 97.5 F (36.4 C) (Oral)  Resp 16  SpO2 95% Physical Exam  Constitutional: He appears well-developed and well-nourished. No distress.  HENT:  Head: Normocephalic and atraumatic.  Eyes: Conjunctivae and EOM are normal. Right eye exhibits no discharge. Left eye exhibits no discharge.  Cardiovascular: Normal rate, regular rhythm and normal heart sounds.   Pulmonary/Chest: Effort normal and breath sounds normal. No respiratory distress. He has no wheezes.  Abdominal: Soft. Bowel sounds are normal. He exhibits no distension. There is no tenderness.  Genitourinary:  External anus with multiple warts with tenderness to palpation. No drainage, blood. No surrounding erythema or swelling. No red streaks.  Neurological: He is alert. He exhibits normal muscle tone. Coordination normal.  Skin: Skin is warm and dry. He is not diaphoretic.  Nursing note and vitals reviewed.   ED Course  Procedures (including critical care time) Labs Review Labs Reviewed - No data to display  Imaging Review No results found.   EKG Interpretation None      Meds given in ED:  Medications  oxyCODONE-acetaminophen (PERCOCET/ROXICET) 5-325 MG per tablet 2 tablet (2 tablets Oral Given 11/21/13 0944)    Discharge Medication List as of 11/21/2013 11:12 AM    START taking these medications   Details  hydrocortisone (ANUSOL-HC) 25 MG suppository Place 1 suppository (25 mg total) rectally 2  (two) times daily. For 7 days, Starting 11/21/2013, Until Discontinued, Print          MDM   Final diagnoses:  Anal condyloma  Rectal pain   Patient with history of anal condyloma seen on multiple occasions here in the ED and given pain medicines. Patient returning with recurrent anal pain and discomfort and has run out of pain meds. Patient with insurance situation and unable to see general surgery for surgical treatment. Insurance will start December 1. No history of fevers, chills, nausea, vomiting. No acute changes with only recurrence of pain. Pain managed in the ED. Prescription for hydrocortisone suppositories for 7 days. Patient to follow-up with surgery. Continue with ibuprofen for pain.   Discussed return precautions with patient. Discussed all results and patient verbalizes understanding and agrees with plan.  Case has been discussed with Dr. Jodi MourningZavitz who agrees with the above plan and to discharge.       Louann SjogrenVictoria L Thatcher Doberstein, PA-C 11/21/13 [address]1142  Anahli Arvanitis L Chidera Dearcos, [place] JerseyPA-C 11/21/13 1142  Enid SkeensJoshua M Zavitz, MD  11/21/13 1616 

## 2013-11-21 NOTE — ED Notes (Signed)
Pt alert and oriented x4. Respirations even and unlabored, bilateral symmetrical rise and fall of chest. Skin warm and dry. In no acute distress. Denies needs.   

## 2013-11-21 NOTE — ED Notes (Signed)
Pt c/o anal pain, that been having puss and hard to have BM. Pt have PMH condyloma.

## 2013-11-24 ENCOUNTER — Other Ambulatory Visit: Payer: Medicare Other

## 2013-11-24 DIAGNOSIS — B20 Human immunodeficiency virus [HIV] disease: Secondary | ICD-10-CM

## 2013-11-24 LAB — CBC WITH DIFFERENTIAL/PLATELET
BASOS ABS: 0 10*3/uL (ref 0.0–0.1)
Basophils Relative: 0 % (ref 0–1)
Eosinophils Absolute: 0.1 10*3/uL (ref 0.0–0.7)
Eosinophils Relative: 1 % (ref 0–5)
HEMATOCRIT: 38.5 % — AB (ref 39.0–52.0)
Hemoglobin: 12.9 g/dL — ABNORMAL LOW (ref 13.0–17.0)
LYMPHS PCT: 37 % (ref 12–46)
Lymphs Abs: 2.4 10*3/uL (ref 0.7–4.0)
MCH: 28.5 pg (ref 26.0–34.0)
MCHC: 33.5 g/dL (ref 30.0–36.0)
MCV: 85 fL (ref 78.0–100.0)
MONO ABS: 0.4 10*3/uL (ref 0.1–1.0)
MONOS PCT: 6 % (ref 3–12)
MPV: 9.6 fL (ref 9.4–12.4)
NEUTROS ABS: 3.6 10*3/uL (ref 1.7–7.7)
NEUTROS PCT: 56 % (ref 43–77)
Platelets: 354 10*3/uL (ref 150–400)
RBC: 4.53 MIL/uL (ref 4.22–5.81)
RDW: 15.1 % (ref 11.5–15.5)
WBC: 6.4 10*3/uL (ref 4.0–10.5)

## 2013-11-25 LAB — COMPLETE METABOLIC PANEL WITH GFR
ALBUMIN: 4.2 g/dL (ref 3.5–5.2)
ALK PHOS: 47 U/L (ref 39–117)
ALT: 19 U/L (ref 0–53)
AST: 30 U/L (ref 0–37)
BUN: 11 mg/dL (ref 6–23)
CHLORIDE: 103 meq/L (ref 96–112)
CO2: 27 mEq/L (ref 19–32)
Calcium: 9.4 mg/dL (ref 8.4–10.5)
Creat: 1.09 mg/dL (ref 0.50–1.35)
GFR, Est African American: >89 mL/min
GFR, Est Non African American: 78 mL/min
GLUCOSE: 83 mg/dL (ref 70–99)
POTASSIUM: 4.4 meq/L (ref 3.5–5.3)
Sodium: 139 mEq/L (ref 135–145)
Total Bilirubin: 0.6 mg/dL (ref 0.2–1.2)
Total Protein: 7.9 g/dL (ref 6.0–8.3)

## 2013-11-25 LAB — T-HELPER CELL (CD4) - (RCID CLINIC ONLY)
CD4 % Helper T Cell: 23 % — ABNORMAL LOW (ref 33–55)
CD4 T Cell Abs: 540 /uL (ref 400–2700)

## 2013-11-26 LAB — HIV-1 RNA QUANT-NO REFLEX-BLD
HIV 1 RNA Quant: 39 copies/mL — ABNORMAL HIGH (ref <20)
HIV-1 RNA Quant, Log: 1.59 {Log} — ABNORMAL HIGH (ref <1.30)

## 2013-12-01 ENCOUNTER — Ambulatory Visit (INDEPENDENT_AMBULATORY_CARE_PROVIDER_SITE_OTHER): Payer: Medicare Other | Admitting: Internal Medicine

## 2013-12-01 ENCOUNTER — Ambulatory Visit (INDEPENDENT_AMBULATORY_CARE_PROVIDER_SITE_OTHER): Payer: Medicare Other | Admitting: *Deleted

## 2013-12-01 ENCOUNTER — Encounter: Payer: Self-pay | Admitting: Internal Medicine

## 2013-12-01 VITALS — BP 153/106 | HR 92 | Temp 98.2°F | Ht 72.0 in | Wt 164.2 lb

## 2013-12-01 DIAGNOSIS — Z23 Encounter for immunization: Secondary | ICD-10-CM

## 2013-12-01 DIAGNOSIS — Z79899 Other long term (current) drug therapy: Secondary | ICD-10-CM

## 2013-12-01 DIAGNOSIS — B20 Human immunodeficiency virus [HIV] disease: Secondary | ICD-10-CM

## 2013-12-01 MED ORDER — OXYCODONE-ACETAMINOPHEN 7.5-325 MG PO TABS: 1.0000 | ORAL_TABLET | ORAL | Status: DC | PRN

## 2013-12-01 NOTE — Progress Notes (Signed)
Patient ID: [name], male   DOB: 1960-04-10, 53 y.o.   MRN: [id number]          Patient Active Problem List   Diagnosis Date Noted  . 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 Human immunodeficiency virus (HIV) disease 11/09/2008    Patient's Medications  New Prescriptions   No medications on file  Previous Medications   BUPROPION (WELLBUTRIN XL) 300 MG 24 HR TABLET    Take 300 mg by mouth daily.   DARUNAVIR ETHANOLATE (PREZISTA) 800 MG TABLET    Take 1 tablet (800 mg total) by mouth daily with breakfast.   HYDROCORTISONE (ANUSOL-HC) 25 MG SUPPOSITORY    Place 1 suppository (25 mg total) rectally 2 (two) times daily. For 7 days   IBUPROFEN (ADVIL,MOTRIN) 800 MG TABLET    Take 1 tablet (800 mg total) by mouth 3 (three) times daily.   NORVIR 100 MG TABS TABLET    TAKE 1 TABLET BY MOUTH DAILY   TRUVADA 200-300 MG PER TABLET    TAKE 1 TABLET BY MOUTH DAILY  Modified Medications   Modified Medication Previous Medication   OXYCODONE-ACETAMINOPHEN (PERCOCET) 7.5-325 MG PER TABLET oxyCODONE-acetaminophen (PERCOCET) 7.5-325 MG per tablet      Take 1 tablet by mouth every 4 (four) hours as needed for pain.    Take 1 tablet by mouth every 4 (four) hours as needed for pain.  Discontinued Medications   EMTRICITABINE-TENOFOVIR (TRUVADA) 200-300 MG PER TABLET    Take 1 tablet by mouth daily.   HYDROXYZINE (VISTARIL) 25 MG CAPSULE    Take 50 mg by mouth at bedtime as needed (sleep).    IBUPROFEN (ADVIL,MOTRIN) 800 MG TABLET    Take 800 mg by mouth every 8 (eight) hours as needed. For pain   LIDOCAINE 4 % GEL    Apply topically tid   RITONAVIR (NORVIR) 100 MG CAPSULE    Take 100 mg by mouth daily.    Subjective: [name] is seen on a work in basis. He recently tripped over a stump and fell flat on his face breaking off to upper teeth. He is also been in the emergency department on 3 occasions this month because of increasing rectal pain from his enlarging condyloma. He has had previous surgery and was  told that the condyloma would probably come back. They have been slowly increasing over the last year. He also received empiric therapy for possible proctitis due to gonorrhea or chlamydia on November 6 in the ED. He received IM ceftriaxone and is currently taking oral doxycycline. However, he denies being sexually active for many years.  He does not recall missing any doses of his Truvada, Prezista were Norvir. He states that he takes them in the morning.  Review of Systems: Pertinent items are noted in HPI.  Past Medical History  Diagnosis Date  . HIV (human immunodeficiency virus infection)   . Hemorrhoids   . 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 Smoking status: Current Every Day Smoker -- 0.90 packs/day for 25 years    Types: Cigarettes  . Smokeless tobacco: Never Used     Comment: recent losses over the past few years  . Alcohol Use: 0.0 oz/week    0 Not specified per week     Comment: 6-12pk beer almost everyday    Family History  Problem Relation Age of Onset  . Cancer Mother     brain  . Cancer Sister     breast  Allergies  Allergen Reactions  . Shellfish Allergy Anaphylaxis and Swelling    Swelling everywhere  . Sulfa Antibiotics Anaphylaxis, Hives, Itching and Swelling    Swelling all over     Objective: Temp: 98.2 F (36.8 C) (11/23 1455) Temp Source: Oral (11/23 1455) BP: 153/106 mmHg (11/23 1502) Pulse Rate: 92 (11/23 1502) Body mass index is 22.27 kg/(m^2).  General: He appears slightly uncomfortable due to pain Oral: He has multiple missing and broken upper teeth Lungs: Clear Cor: Regular S1 and S2 with no murmurs  Lab Results Lab Results  Component Value Date   WBC 6.4 11/24/2013   HGB 12.9* 11/24/2013   HCT 38.5* 11/24/2013   MCV 85.0 11/24/2013   PLT 354 11/24/2013    Lab Results  Component Value Date   CREATININE 1.09 11/24/2013   BUN 11 11/24/2013   NA 139 11/24/2013   K 4.4 11/24/2013   CL 103  11/24/2013   CO2 27 11/24/2013    Lab Results  Component Value Date   ALT 19 11/24/2013   AST 30 11/24/2013   ALKPHOS 47 11/24/2013   BILITOT 0.6 11/24/2013    Lab Results  Component Value Date   CHOL 184 04/16/2013   HDL 65 04/16/2013   LDLCALC 78 04/16/2013   TRIG 206* 04/16/2013   CHOLHDL 2.8 04/16/2013    Lab Results HIV 1 RNA QUANT (copies/mL)  Date Value  11/24/2013 39*  05/14/2013 25*  04/16/2013 220*   CD4 T CELL ABS  Date Value  11/24/2013 540 /uL  04/16/2013 370 /uL*  03/07/2012 290 cmm*     Assessment: His HIV infection is under reasonably good control.  Plan: 1. Continue current antiretroviral regimen 2. Refilled Percocet, #20 3. Referred to dental clinic as soon as possible 4. Gen. surgery follow-up on December 4 for condyloma 5. Follow-up when necessary after lab work in 3 months   Cliffton AstersJohn Bay Jarquin, MD [facility] [phone] pager   [phone] cell 12/01/2013, 3:35 PM

## 2013-12-13 ENCOUNTER — Other Ambulatory Visit: Payer: Self-pay | Admitting: Internal Medicine

## 2013-12-19 ENCOUNTER — Other Ambulatory Visit (INDEPENDENT_AMBULATORY_CARE_PROVIDER_SITE_OTHER): Payer: Self-pay | Admitting: Surgery

## 2014-01-13 ENCOUNTER — Encounter (HOSPITAL_BASED_OUTPATIENT_CLINIC_OR_DEPARTMENT_OTHER): Payer: Self-pay | Admitting: *Deleted

## 2014-01-13 NOTE — Progress Notes (Signed)
No new labs needed-has been here x2 2013 Dr Orvan Falconercampbell did labs 11/15-has cut back alcohol and cigarettes

## 2014-01-20 ENCOUNTER — Emergency Department (HOSPITAL_COMMUNITY)
Admission: EM | Admit: 2014-01-20 | Discharge: 2014-01-20 | Disposition: A | Discharge status: Home / Self Care | Payer: Medicare Other | Attending: Emergency Medicine | Admitting: Emergency Medicine

## 2014-01-20 ENCOUNTER — Ambulatory Visit (HOSPITAL_BASED_OUTPATIENT_CLINIC_OR_DEPARTMENT_OTHER): Admission: RE | Admit: 2014-01-20 | Payer: Medicare Other | Source: Ambulatory Visit | Admitting: Surgery

## 2014-01-20 ENCOUNTER — Encounter (HOSPITAL_COMMUNITY): Payer: Self-pay

## 2014-01-20 DIAGNOSIS — Z79899 Other long term (current) drug therapy: Secondary | ICD-10-CM | POA: Diagnosis not present

## 2014-01-20 DIAGNOSIS — K029 Dental caries, unspecified: Secondary | ICD-10-CM | POA: Insufficient documentation

## 2014-01-20 DIAGNOSIS — Z7952 Long term (current) use of systemic steroids: Secondary | ICD-10-CM | POA: Insufficient documentation

## 2014-01-20 DIAGNOSIS — Z791 Long term (current) use of non-steroidal anti-inflammatories (NSAID): Secondary | ICD-10-CM | POA: Insufficient documentation

## 2014-01-20 DIAGNOSIS — B20 Human immunodeficiency virus [HIV] disease: Secondary | ICD-10-CM

## 2014-01-20 DIAGNOSIS — Z8619 Personal history of other infectious and parasitic diseases: Secondary | ICD-10-CM | POA: Diagnosis not present

## 2014-01-20 DIAGNOSIS — R220 Localized swelling, mass and lump, head: Secondary | ICD-10-CM | POA: Diagnosis present

## 2014-01-20 DIAGNOSIS — Z720 Tobacco use: Secondary | ICD-10-CM | POA: Diagnosis not present

## 2014-01-20 DIAGNOSIS — Z21 Asymptomatic human immunodeficiency virus [HIV] infection status: Secondary | ICD-10-CM | POA: Insufficient documentation

## 2014-01-20 DIAGNOSIS — K047 Periapical abscess without sinus: Secondary | ICD-10-CM | POA: Insufficient documentation

## 2014-01-20 SURGERY — FULGURATION, WART, ANUS
Anesthesia: General

## 2014-01-20 MED ORDER — MIDAZOLAM HCL 2 MG/2ML IJ SOLN
INTRAMUSCULAR | Status: AC
  Filled 2014-01-20: qty 2

## 2014-01-20 MED ORDER — FENTANYL CITRATE 0.05 MG/ML IJ SOLN
INTRAMUSCULAR | Status: AC
  Filled 2014-01-20: qty 6

## 2014-01-20 MED ORDER — OXYCODONE-ACETAMINOPHEN 5-325 MG PO TABS: 2.0000 | ORAL_TABLET | ORAL | Status: DC | PRN

## 2014-01-20 MED ORDER — HYDROMORPHONE HCL 1 MG/ML IJ SOLN
1.0000 mg | Freq: Once | INTRAMUSCULAR | Status: AC
  Administered 2014-01-20: 1 mg via INTRAVENOUS
  Filled 2014-01-20: qty 1

## 2014-01-20 MED ORDER — CLINDAMYCIN PHOSPHATE 600 MG/50ML IV SOLN
600.0000 mg | Freq: Once | INTRAVENOUS | Status: AC
  Administered 2014-01-20: 600 mg via INTRAVENOUS
  Filled 2014-01-20: qty 50

## 2014-01-20 MED ORDER — CLINDAMYCIN HCL 300 MG PO CAPS: 300.0000 mg | ORAL_CAPSULE | Freq: Three times a day (TID) | ORAL | Status: DC

## 2014-01-20 NOTE — H&P (Signed)
[name] is an 54 y.o. male.   Chief Complaint: anal condyloma HPI: this is a patient with HIV and recurrent anal condyloma who presents with worsening perianal pain.  Past Medical History  Diagnosis Date  . HIV (human immunodeficiency virus infection)   . Hemorrhoids   . 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 Tendon repair      2006-lt index  . Hernia repair  97    lt ing  . Wart fulguration  05/31/2011    Procedure: FULGURATION ANAL WART;  Surgeon: Shelly Rubensteinouglas A Jonas Goh, MD;  Location: [facility];  Service: General;  Laterality: N/A;  excision anal condyloma  . Wart fulguration  07/06/2011    Procedure: FULGURATION ANAL WART;  Surgeon: Shelly Rubensteinouglas A Lavance Beazer, MD;  Location: [facility];  Service: General;  Laterality: N/A;  excision anal condyloma    Family History  Problem Relation Age of Onset  . Cancer Mother     brain  . Cancer Sister     breast   Social History:  reports that he has been smoking Cigarettes.  He has a 12.5 pack-year smoking history. He has never used smokeless tobacco. He reports that he drinks alcohol. He reports that he does not use illicit drugs.  Allergies:  Allergies  Allergen Reactions  . Shellfish Allergy Anaphylaxis and Swelling    Swelling everywhere  . Sulfa Antibiotics Anaphylaxis, Hives, Itching and Swelling    Swelling all over     No prescriptions prior to admission    No results found for this or any previous visit (from the past 48 hour(s)). No results found.  Review of Systems  All other systems reviewed and are negative.   Height 6' (1.829 m), weight 164 lb (74.39 kg). Physical Exam  Constitutional: He is oriented to person, place, and time. He appears well-developed. No distress.  HENT:  Head: Normocephalic and atraumatic.  Eyes: Pupils are equal, round, and reactive to light.  Neck: Normal range of motion.  Cardiovascular: Normal rate, regular rhythm  and normal heart sounds.   Respiratory: Effort normal and breath sounds normal.  GI: Soft. There is no tenderness.  Genitourinary:  Anal condyloma  Musculoskeletal: Normal range of motion.  Neurological: He is alert and oriented to person, place, and time.  Skin: Skin is warm and dry.  Psychiatric: His behavior is normal.     Assessment/Plan Anal condyloma  Will plan excision in the OR of the condyloma.  The risks were discussed.   These include but are not limited to bleeding, infection, recurrence, finding cancer, need for further surgery, incontinence, DVT, etc.  He agrees to proceed.  Lamiya Naas A 01/20/2014, 6:16 AM

## 2014-01-20 NOTE — ED Notes (Signed)
Pt states he feels like the swelling is cutting off his breath. [facility].Pt reports upper anterior tooth is broken.

## 2014-01-20 NOTE — Discharge Instructions (Signed)
Dental Abscess A dental abscess is a collection of infected fluid (pus) from a bacterial infection in the inner part of the tooth (pulp). It usually occurs at the end of the tooth's root.  CAUSES   Severe tooth decay.  Trauma to the tooth that allows bacteria to enter into the pulp, such as a broken or chipped tooth. SYMPTOMS   Severe pain in and around the infected tooth.  Swelling and redness around the abscessed tooth or in the mouth or face.  Tenderness.  Pus drainage.  Bad breath.  Bitter taste in the mouth.  Difficulty swallowing.  Difficulty opening the mouth.  Nausea.  Vomiting.  Chills.  Swollen neck glands. DIAGNOSIS   A medical and dental history will be taken.  An examination will be performed by tapping on the abscessed tooth.  X-rays may be taken of the tooth to identify the abscess. TREATMENT The goal of treatment is to eliminate the infection. You may be prescribed antibiotic medicine to stop the infection from spreading. A root canal may be performed to save the tooth. If the tooth cannot be saved, it may be pulled (extracted) and the abscess may be drained.  HOME CARE INSTRUCTIONS  Only take over-the-counter or prescription medicines for pain, fever, or discomfort as directed by your caregiver.  Rinse your mouth (gargle) often with salt water ( tsp salt in 8 oz [250 ml] of warm water) to relieve pain or swelling.  Do not drive after taking pain medicine (narcotics).  Do not apply heat to the outside of your face.  Return to your dentist for further treatment as directed. SEEK MEDICAL CARE IF:  Your pain is not helped by medicine.  Your pain is getting worse instead of better. SEEK IMMEDIATE MEDICAL CARE IF:  You have a fever or persistent symptoms for more than 2-3 days.  You have a fever and your symptoms suddenly get worse.  You have chills or a very bad headache.  You have problems breathing or swallowing.  You have trouble  opening your mouth.  You have swelling in the neck or around the eye. Document Released: 12/26/2004 Document Revised: 09/20/2011 Document Reviewed: 04/05/2010 [facility] Patient Information 2015 [place], [place]. This information is not intended to replace advice given to you by your health care provider. Make sure you discuss any questions you have with your health care provider.  Dental Caries Dental caries (also called tooth decay) is the most common oral disease. It can occur at any age but is more common in children and young adults.  HOW DENTAL CARIES DEVELOPS  The process of decay begins when bacteria and foods (particularly sugars and starches) combine in your mouth to produce plaque. Plaque is a substance that sticks to the hard, outer surface of a tooth (enamel). The bacteria in plaque produce acids that attack enamel. These acids may also attack the root surface of a tooth (cementum) if it is exposed. Repeated attacks dissolve these surfaces and create holes in the tooth (cavities). If left untreated, the acids destroy the other layers of the tooth.  RISK FACTORS  Frequent sipping of sugary beverages.   Frequent snacking on sugary and starchy foods, especially those that easily get stuck in the teeth.   Poor oral hygiene.   Dry mouth.   Substance abuse such as methamphetamine abuse.   Broken or poor-fitting dental restorations.   Eating disorders.   Gastroesophageal reflux disease (GERD).   Certain radiation treatments to the head and neck. SYMPTOMS In  the early stages of dental caries, symptoms are seldom present. Sometimes white, chalky areas may be seen on the enamel or other tooth layers. In later stages, symptoms may include: °· Pits and holes on the enamel. °· Toothache after sweet, hot, or cold foods or drinks are consumed. °· Pain around the tooth. °· Swelling around the tooth. °DIAGNOSIS  °Most of the time, dental caries is detected during a regular dental  checkup. A diagnosis is made after a thorough medical and dental history is taken and the surfaces of your teeth are checked for signs of dental caries. Sometimes special instruments, such as lasers, are used to check for dental caries. Dental X-ray exams may be taken so that areas not visible to the eye (such as between the contact areas of the teeth) can be checked for cavities.  °TREATMENT  °If dental caries is in its early stages, it may be reversed with a fluoride treatment or an application of a remineralizing agent at the dental office. Thorough brushing and flossing at home is needed to aid these treatments. If it is in its later stages, treatment depends on the location and extent of tooth destruction:  °· If a small area of the tooth has been destroyed, the destroyed area will be removed and cavities will be filled with a material such as gold, silver amalgam, or composite resin.   °· If a large area of the tooth has been destroyed, the destroyed area will be removed and a cap (crown) will be fitted over the remaining tooth structure.   °· If the center part of the tooth (pulp) is affected, a procedure called a root canal will be needed before a filling or crown can be placed.   °· If most of the tooth has been destroyed, the tooth may need to be pulled (extracted). °HOME CARE INSTRUCTIONS °You can prevent, stop, or reverse dental caries at home by practicing good oral hygiene. Good oral hygiene includes: °· Thoroughly cleaning your teeth at least twice a day with a toothbrush and dental floss.   °· Using a fluoride toothpaste. A fluoride mouth rinse may also be used if recommended by your dentist or health care provider.   °· Restricting the amount of sugary and starchy foods and sugary liquids you consume.   °· Avoiding frequent snacking on these foods and sipping of these liquids.   °· Keeping regular visits with a dentist for checkups and cleanings. °PREVENTION  °· Practice good oral  hygiene. °· Consider a dental sealant. A dental sealant is a coating material that is applied by your dentist to the pits and grooves of teeth. The sealant prevents food from being trapped in them. It may protect the teeth for several years. °· Ask about fluoride supplements if you live in a community without fluorinated water or with water that has a low fluoride content. Use fluoride supplements as directed by your dentist or health care provider. °· Allow fluoride varnish applications to teeth if directed by your dentist or health care provider. °Document Released: 09/17/2001 Document Revised: 05/12/2013 Document Reviewed: 12/29/2011 °[employer]® Patient Information ©2015 [employer]. This information is not intended to replace advice given to you by your health care provider. Make sure you discuss any questions you have with your health care provider. ° °

## 2014-01-20 NOTE — ED Provider Notes (Signed)
CSN: [id number]     Arrival date & time 01/20/14  0920 History   First MD Initiated Contact with Patient 01/20/14 [phone]     Chief Complaint  Patient presents with  . Facial Swelling      HPI Pt here for facial swelling to the right face. Has had pain to it for the past 3 days and swelling last night but bigger today.  Patient has several known dental caries.  Patient's HIV positive and is currently taking medication for HIV.  Patient denies any recent fever.  Patient has an appointment for dental surgery scheduled for the 20th of this month. Past Medical History  Diagnosis Date  . HIV (human immunodeficiency virus infection)   . Hemorrhoids   . 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 Tendon repair      2006-lt index  . Hernia repair  97    lt ing  . Wart fulguration  05/31/2011    Procedure: FULGURATION ANAL WART;  Surgeon: Shelly Rubensteinouglas A Blackman, MD;  Location: [facility];  Service: General;  Laterality: N/A;  excision anal condyloma  . Wart fulguration  07/06/2011    Procedure: FULGURATION ANAL WART;  Surgeon: Shelly Rubensteinouglas A Blackman, MD;  Location: [facility];  Service: General;  Laterality: N/A;  excision anal condyloma   Family History  Problem Relation Age of Onset  . Cancer Mother     brain  . Cancer Sister     breast   History  Substance Use Topics  . Smoking status: Current Every Day Smoker -- 0.50 packs/day for 25 years    Types: Cigarettes  . Smokeless tobacco: Never Used     Comment: recent losses over the past few years  . Alcohol Use: 0.0 oz/week    0 Not specified per week     Comment: 6-12pk beer almost everyday-cutting-not daily now    Review of Systems  All other systems reviewed and are negative  Allergies  Shellfish allergy and Sulfa antibiotics  Home Medications   Prior to Admission medications   Medication Sig Start Date End Date Taking? Authorizing Provider  NORVIR 100 MG TABS  tablet TAKE 1 TABLET BY MOUTH DAILY 12/15/13  Yes Cliffton AstersJohn Campbell, MD  PREZISTA 800 MG tablet TAKE 1 TABLET BY MOUTH DAILY WITH BREAKFAST. 12/15/13  Yes Cliffton AstersJohn Campbell, MD  TRUVADA 200-300 MG per tablet TAKE 1 TABLET BY MOUTH DAILY 12/15/13  Yes Cliffton AstersJohn Campbell, MD  clindamycin (CLEOCIN) 300 MG capsule Take 1 capsule (300 mg total) by mouth 3 (three) times daily. 01/20/14   Nelia Shiobert L Katherine Tout, MD  hydrocortisone (ANUSOL-HC) 25 MG suppository Place 1 suppository (25 mg total) rectally 2 (two) times daily. For 7 days Patient not taking: Reported on 12/01/2013 11/21/13   Louann SjogrenVictoria L Creech, PA-C  ibuprofen (ADVIL,MOTRIN) 800 MG tablet Take 1 tablet (800 mg total) by mouth 3 (three) times daily. Patient not taking: Reported on 01/20/2014 11/21/13   Louann SjogrenVictoria L Creech, PA-C  oxyCODONE-acetaminophen (PERCOCET) 7.5-325 MG per tablet Take 1 tablet by mouth every 4 (four) hours as needed for pain. Patient not taking: Reported on 01/20/2014 12/01/13   Cliffton AstersJohn Campbell, MD  oxyCODONE-acetaminophen (PERCOCET/ROXICET) 5-325 MG per tablet Take 2 tablets by mouth every 4 (four) hours as needed for moderate pain or severe pain. 01/20/14   Nelia Shiobert L Demoni Gergen, MD   BP 162/97 mmHg  Pulse 90  Temp(Src) 98 F (36.7 C) (Oral)  Resp 17  Ht   (1.905 m)  Wt 164 lb (74.39 kg)  BMI 20.50 kg/m2  SpO2 98% Physical Exam  Constitutional: He is oriented to person, place, and time. He appears well-developed and well-nourished. No distress.  HENT:  Head: Normocephalic and atraumatic.  Mouth/Throat: Dental caries present.    No drainable abscess seen or palpated  Eyes: Pupils are equal, round, and reactive to light.  Neck: Normal range of motion.  Cardiovascular: Normal rate and intact distal pulses.   Pulmonary/Chest: No respiratory distress.  Abdominal: Normal appearance. He exhibits no distension.  Musculoskeletal: Normal range of motion.  Neurological: He is alert and oriented to person, place, and time. No cranial nerve deficit.    Skin: Skin is warm and dry. No rash noted.  Psychiatric: He has a normal mood and affect. His behavior is normal.  Nursing note and vitals reviewed.   ED Course  Procedures (including critical care time) Medications  clindamycin (CLEOCIN) IVPB 600 mg (600 mg Intravenous New Bag/Given 01/20/14 1030)  HYDROmorphone (DILAUDID) injection 1 mg (1 mg Intravenous Given 01/20/14 1031)       MDM   Final diagnoses:  Dental caries  Dental abscess  HIV (human immunodeficiency virus infection)        Nelia Shi, MD 01/20/14 1104

## 2014-01-20 NOTE — ED Notes (Signed)
Pt here for facial swelling to the right face. Has had pain to it for the past 3 days and swelling last night but bigger today.

## 2014-01-22 ENCOUNTER — Encounter (HOSPITAL_BASED_OUTPATIENT_CLINIC_OR_DEPARTMENT_OTHER): Payer: Self-pay | Admitting: Surgery

## 2014-02-16 ENCOUNTER — Other Ambulatory Visit: Payer: Self-pay | Admitting: Infectious Diseases

## 2014-02-16 DIAGNOSIS — B20 Human immunodeficiency virus [HIV] disease: Secondary | ICD-10-CM

## 2014-03-16 ENCOUNTER — Other Ambulatory Visit (INDEPENDENT_AMBULATORY_CARE_PROVIDER_SITE_OTHER): Payer: Self-pay | Admitting: Surgery

## 2014-04-01 ENCOUNTER — Other Ambulatory Visit (HOSPITAL_COMMUNITY): Payer: Self-pay | Admitting: *Deleted

## 2014-04-01 ENCOUNTER — Inpatient Hospital Stay (HOSPITAL_COMMUNITY)
Admission: RE | Admit: 2014-04-01 | Discharge: 2014-04-01 | Disposition: A | Discharge status: Home / Self Care | Payer: Medicare Other | Source: Ambulatory Visit

## 2014-04-01 MED ORDER — CEFAZOLIN SODIUM-DEXTROSE 2-3 GM-% IV SOLR: 2.0000 g | INTRAVENOUS | Status: DC

## 2014-04-01 NOTE — H&P (Signed)
  [name] is an 54 y.o. male.  Chief Complaint: anal condyloma HPI: this is a patient with HIV and recurrent anal condyloma who presents with worsening perianal pain.  Surgery originally cancelled for oral abscess now resolved.  Past Medical History  Diagnosis Date  . HIV (human immunodeficiency virus infection)   . Hemorrhoids   . 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 Tendon repair      2006-lt index  . Hernia repair  97    lt ing  . Wart fulguration  05/31/2011    Procedure: FULGURATION ANAL WART; Surgeon: Shelly Rubensteinouglas A Alica Shellhammer, MD; Location: [facility]; Service: General; Laterality: N/A; excision anal condyloma  . Wart fulguration  07/06/2011    Procedure: FULGURATION ANAL WART; Surgeon: Shelly Rubensteinouglas A Jalexis Breed, MD; Location: [facility]; Service: General; Laterality: N/A; excision anal condyloma    Family History  Problem Relation Age of Onset  . Cancer Mother     brain  . Cancer Sister     breast   Social History:  reports that he has been smoking Cigarettes. He has a 12.5 pack-year smoking history. He has never used smokeless tobacco. He reports that he drinks alcohol. He reports that he does not use illicit drugs.  Allergies:  Allergies  Allergen Reactions  . Shellfish Allergy Anaphylaxis and Swelling    Swelling everywhere  . Sulfa Antibiotics Anaphylaxis, Hives, Itching and Swelling    Swelling all over     No prescriptions prior to admission     Lab Results Last 48 Hours    No results found for this or any previous visit (from the past 48 hour(s)).    Imaging Results (Last 48 hours)    No results found.    Review of Systems  All other systems reviewed and are negative.   Height 6' (1.829 m), weight 164 lb (74.39 kg). Physical Exam  Constitutional: He is oriented to person, place, and  time. He appears well-developed. No distress.  HENT:  Head: Normocephalic and atraumatic.  Eyes: Pupils are equal, round, and reactive to light.  Neck: Normal range of motion.  Cardiovascular: Normal rate, regular rhythm and normal heart sounds.  Respiratory: Effort normal and breath sounds normal.  GI: Soft. There is no tenderness.  Genitourinary:  Anal condyloma  Musculoskeletal: Normal range of motion.  Neurological: He is alert and oriented to person, place, and time.  Skin: Skin is warm and dry.  Psychiatric: His behavior is normal.     Assessment/Plan Anal condyloma  Will plan excision in the OR of the condyloma. The risks were discussed. These include but are not limited to bleeding, infection, recurrence, finding cancer, need for further surgery, incontinence, DVT, etc. He agrees to proceed.  Lonzo Saulter A

## 2014-04-01 NOTE — Pre-Procedure Instructions (Signed)
[name]  04/01/2014   Your procedure is scheduled on:  04/02/14  Report to [facility] short stay admitting at 530 AM.per dr  Call this number if you have problems the morning of surgery: [phone]   Remember:   Do not eat food or drink liquids after midnight.   Take these medicines the morning of surgery with A SIP OF WATER: cleocin, pain med if needed, prezista,norvir, truvada  STOP all herbel meds, nsaids (aleve,naproxen,advil,ibuprofen) now including aspirin, vitamins   Do not wear jewelry, make-up or nail polish.  Do not wear lotions, powders, or perfumes. You may wear deodorant.  Do not shave 48 hours prior to surgery. Men may shave face and neck.  Do not bring valuables to the hospital.  [facility] is not responsible                  for any belongings or valuables.               Contacts, dentures or bridgework may not be worn into surgery.  Leave suitcase in the car. After surgery it may be brought to your room.  For patients admitted to the hospital, discharge time is determined by your                treatment team.               Patients discharged the day of surgery will not be allowed to drive  home.  Name and phone number of your driver:   Special Instructions:  Special Instructions: [facility] - Preparing for Surgery  Before surgery, you can play an important role.  Because skin is not sterile, your skin needs to be as free of germs as possible.  You can reduce the number of germs on you skin by washing with CHG (chlorahexidine gluconate) soap before surgery.  CHG is an antiseptic cleaner which kills germs and bonds with the skin to continue killing germs even after washing.  Please DO NOT use if you have an allergy to CHG or antibacterial soaps.  If your skin becomes reddened/irritated stop using the CHG and inform your nurse when you arrive at Short Stay.  Do not shave (including legs and underarms) for at least 48 hours prior to the first CHG shower.  You  may shave your face.  Please follow these instructions carefully:   1.  Shower with CHG Soap the night before surgery and the morning of Surgery.  2.  If you choose to wash your hair, wash your hair first as usual with your normal shampoo.  3.  After you shampoo, rinse your hair and body thoroughly to remove the Shampoo.  4.  Use CHG as you would any other liquid soap.  You can apply chg directly  to the skin and wash gently with scrungie or a clean washcloth.  5.  Apply the CHG Soap to your body ONLY FROM THE NECK DOWN.  Do not use on open wounds or open sores.  Avoid contact with your eyes ears, mouth and genitals (private parts).  Wash genitals (private parts)       with your normal soap.  6.  Wash thoroughly, paying special attention to the area where your surgery will be performed.  7.  Thoroughly rinse your body with warm water from the neck down.  8.  DO NOT shower/wash with your normal soap after using and rinsing off the CHG Soap.  9.  Pat yourself dry  with a clean towel.            10.  Wear clean pajamas.            11.  Place clean sheets on your bed the night of your first shower and do not sleep with pets.  Day of Surgery  Do not apply any lotions/deodorants the morning of surgery.  Please wear clean clothes to the hospital/surgery center.   Please read over the following fact sheets that you were given: Pain Booklet, Coughing and Deep Breathing and Surgical Site Infection Prevention

## 2014-04-01 NOTE — Progress Notes (Signed)
Patient no show for pradmit appt,. I called to see if coming for appt and pt stated"was going to have to cancel surgery due to no transportation/family here to take him to hospital and home tomorrow". I asked if he had called dr office and he stated he "was going to call". I called surgery desk and informed them.

## 2014-04-02 ENCOUNTER — Encounter (HOSPITAL_COMMUNITY): Admission: RE | Payer: Self-pay | Source: Ambulatory Visit

## 2014-04-02 ENCOUNTER — Ambulatory Visit (HOSPITAL_COMMUNITY): Admission: RE | Admit: 2014-04-02 | Payer: Medicare Other | Source: Ambulatory Visit | Admitting: Surgery

## 2014-04-02 SURGERY — FULGURATION, WART, ANUS
Anesthesia: General

## 2014-04-02 NOTE — Interval H&P Note (Signed)
History and Physical Interval Note:  04/02/2014 7:02 AM  [name]  has presented today for surgery, with the diagnosis of anal condyloma  The various methods of treatment have been discussed with the patient and family. After consideration of risks, benefits and other options for treatment, the patient has consented to  Procedure(s): EXCISION ANAL CONDYLOMA (N/A) as a surgical intervention .  The patient's history has been reviewed, patient examined, no change in status, stable for surgery.  I have reviewed the patient's chart and labs.  Questions were answered to the patient's satisfaction.     Inmer Nix A

## 2014-04-20 ENCOUNTER — Other Ambulatory Visit: Payer: Self-pay | Admitting: *Deleted

## 2014-04-21 NOTE — Patient Instructions (Signed)
[name]  04/21/2014   Your procedure is scheduled on:  04/23/2014    Report [name] and follow signs to               [name].  Call this number if you have problems the morning of surgery [phone]   Remember:  Do not eat food or drink liquids :After Midnight.     Take these medicines the morning of surgery with A SIP OF WATER:  none                               You may not have any metal on your body including hair pins and              piercings  Do not wear jewelry,, lotions, powders or perfumes., deodorant         .              Men may shave face and neck.   Do not bring valuables to the hospital. [name].  Contacts, dentures or bridgework may not be worn into surgery.       Patients discharged the day of surgery will not be allowed to drive home.  Name and phone number of your driver:  Special Instructions: coughing and deep breathing exercises, leg exercises               Please read over the following fact sheets you were given: _____________________________________________________________________             [facility] - Preparing for [facility] Before surgery, you can play an important role.  Because skin is not sterile, your skin needs to be as free of germs as possible.  You can reduce the number of germs on your skin by washing with CHG (chlorahexidine gluconate) soap before surgery.  CHG is an antiseptic cleaner which kills germs and bonds with the skin to continue killing germs even after washing. Please DO NOT use if you have an allergy to CHG or antibacterial soaps.  If your skin becomes reddened/irritated stop using the CHG and inform your nurse when you arrive at Short Stay. Do not shave (including legs and underarms) for at least 48 hours prior to the first CHG shower.  You may shave your face/neck. Please follow these  instructions carefully:  1.  Shower with CHG Soap the night before surgery and the  morning of Surgery.  2.  If you choose to wash your hair, wash your hair first as usual with your  normal  shampoo.  3.  After you shampoo, rinse your hair and body thoroughly to remove the  shampoo.                           4.  Use CHG as you would any other liquid soap.  You can apply chg directly  to the skin and wash                       Gently with a scrungie or clean washcloth.  5.  Apply the CHG Soap to your body  ONLY FROM THE NECK DOWN.   Do not use on face/ open                           Wound or open sores. Avoid contact with eyes, ears mouth and genitals (private parts).                       Wash face,  Genitals (private parts) with your normal soap.             6.  Wash thoroughly, paying special attention to the area where your surgery  will be performed.  7.  Thoroughly rinse your body with warm water from the neck down.  8.  DO NOT shower/wash with your normal soap after using and rinsing off  the CHG Soap.                9.  Pat yourself dry with a clean towel.            10.  Wear clean pajamas.            11.  Place clean sheets on your bed the night of your first shower and do not  sleep with pets. Day of Surgery : Do not apply any lotions/deodorants the morning of surgery.  Please wear clean clothes to the hospital/surgery center.  FAILURE TO FOLLOW THESE INSTRUCTIONS MAY RESULT IN THE CANCELLATION OF YOUR SURGERY PATIENT SIGNATURE_________________________________  NURSE SIGNATURE__________________________________  ________________________________________________________________________

## 2014-04-22 ENCOUNTER — Inpatient Hospital Stay (HOSPITAL_COMMUNITY)
Admission: RE | Admit: 2014-04-22 | Discharge: 2014-04-22 | Disposition: A | Discharge status: Home / Self Care | Payer: Medicare Other | Source: Ambulatory Visit

## 2014-04-22 ENCOUNTER — Encounter (HOSPITAL_COMMUNITY): Payer: Self-pay

## 2014-04-23 ENCOUNTER — Ambulatory Visit (HOSPITAL_COMMUNITY): Payer: Medicare Other | Admitting: Certified Registered Nurse Anesthetist

## 2014-04-23 ENCOUNTER — Encounter (HOSPITAL_COMMUNITY): Payer: Self-pay | Admitting: Anesthesiology

## 2014-04-23 ENCOUNTER — Encounter (HOSPITAL_COMMUNITY)
Admission: RE | Disposition: A | Discharge status: Home / Self Care | Payer: Self-pay | Source: Ambulatory Visit | Attending: Surgery

## 2014-04-23 ENCOUNTER — Inpatient Hospital Stay (HOSPITAL_COMMUNITY)
Admission: RE | Admit: 2014-04-23 | Discharge: 2014-04-26 | DRG: 579 | Disposition: A | Discharge status: Home / Self Care | Payer: Medicare Other | Source: Ambulatory Visit | Attending: Surgery | Admitting: Surgery

## 2014-04-23 DIAGNOSIS — Z882 Allergy status to sulfonamides status: Secondary | ICD-10-CM

## 2014-04-23 DIAGNOSIS — K91840 Postprocedural hemorrhage and hematoma of a digestive system organ or structure following a digestive system procedure: Secondary | ICD-10-CM | POA: Diagnosis not present

## 2014-04-23 DIAGNOSIS — Y92239 Unspecified place in hospital as the place of occurrence of the external cause: Secondary | ICD-10-CM

## 2014-04-23 DIAGNOSIS — F17210 Nicotine dependence, cigarettes, uncomplicated: Secondary | ICD-10-CM | POA: Diagnosis present

## 2014-04-23 DIAGNOSIS — Z91013 Allergy to seafood: Secondary | ICD-10-CM

## 2014-04-23 DIAGNOSIS — Y838 Other surgical procedures as the cause of abnormal reaction of the patient, or of later complication, without mention of misadventure at the time of the procedure: Secondary | ICD-10-CM | POA: Diagnosis not present

## 2014-04-23 DIAGNOSIS — B20 Human immunodeficiency virus [HIV] disease: Secondary | ICD-10-CM | POA: Diagnosis present

## 2014-04-23 DIAGNOSIS — I959 Hypotension, unspecified: Secondary | ICD-10-CM | POA: Diagnosis not present

## 2014-04-23 DIAGNOSIS — A630 Anogenital (venereal) warts: Principal | ICD-10-CM | POA: Diagnosis present

## 2014-04-23 HISTORY — PX: WART FULGURATION: SHX5245

## 2014-04-23 LAB — CBC
HCT: 41.5 % (ref 39.0–52.0)
Hemoglobin: 13.3 g/dL (ref 13.0–17.0)
MCH: 28.6 pg (ref 26.0–34.0)
MCHC: 32 g/dL (ref 30.0–36.0)
MCV: 89.2 fL (ref 78.0–100.0)
PLATELETS: 292 10*3/uL (ref 150–400)
RBC: 4.65 MIL/uL (ref 4.22–5.81)
RDW: 14.7 % (ref 11.5–15.5)
WBC: 6.1 10*3/uL (ref 4.0–10.5)

## 2014-04-23 LAB — CBC WITH DIFFERENTIAL/PLATELET
BASOS ABS: 0 10*3/uL (ref 0.0–0.1)
BASOS PCT: 0 % (ref 0–1)
EOS ABS: 0.1 10*3/uL (ref 0.0–0.7)
Eosinophils Relative: 1 % (ref 0–5)
HCT: 36.3 % — ABNORMAL LOW (ref 39.0–52.0)
Hemoglobin: 11.1 g/dL — ABNORMAL LOW (ref 13.0–17.0)
Lymphocytes Relative: 26 % (ref 12–46)
Lymphs Abs: 3.2 10*3/uL (ref 0.7–4.0)
MCH: 27.7 pg (ref 26.0–34.0)
MCHC: 30.6 g/dL (ref 30.0–36.0)
MCV: 90.5 fL (ref 78.0–100.0)
Monocytes Absolute: 1.2 10*3/uL — ABNORMAL HIGH (ref 0.1–1.0)
Monocytes Relative: 9 % (ref 3–12)
NEUTROS PCT: 64 % (ref 43–77)
Neutro Abs: 8.1 10*3/uL — ABNORMAL HIGH (ref 1.7–7.7)
PLATELETS: 286 10*3/uL (ref 150–400)
RBC: 4.01 MIL/uL — ABNORMAL LOW (ref 4.22–5.81)
RDW: 14.7 % (ref 11.5–15.5)
WBC: 12.5 10*3/uL — ABNORMAL HIGH (ref 4.0–10.5)

## 2014-04-23 LAB — BASIC METABOLIC PANEL
Anion gap: 7 (ref 5–15)
BUN: 13 mg/dL (ref 6–23)
CALCIUM: 8.8 mg/dL (ref 8.4–10.5)
CHLORIDE: 102 mmol/L (ref 96–112)
CO2: 28 mmol/L (ref 19–32)
CREATININE: 1.24 mg/dL (ref 0.50–1.35)
GFR calc Af Amer: 75 mL/min — ABNORMAL LOW (ref >90)
GFR calc non Af Amer: 65 mL/min — ABNORMAL LOW (ref >90)
Glucose, Bld: 87 mg/dL (ref 70–99)
Potassium: 3.7 mmol/L (ref 3.5–5.1)
Sodium: 137 mmol/L (ref 135–145)

## 2014-04-23 SURGERY — FULGURATION, WART, ANUS
Anesthesia: General

## 2014-04-23 MED ORDER — PHENYLEPHRINE HCL 10 MG/ML IJ SOLN
INTRAMUSCULAR | Status: DC | PRN
  Administered 2014-04-23: 40 ug via INTRAVENOUS
  Administered 2014-04-23 (×2): 80 ug via INTRAVENOUS

## 2014-04-23 MED ORDER — PHENYLEPHRINE 40 MCG/ML (10ML) SYRINGE FOR IV PUSH (FOR BLOOD PRESSURE SUPPORT)
PREFILLED_SYRINGE | INTRAVENOUS | Status: AC
  Filled 2014-04-23: qty 10

## 2014-04-23 MED ORDER — ONDANSETRON HCL 4 MG/2ML IJ SOLN
INTRAMUSCULAR | Status: AC
  Filled 2014-04-23: qty 2

## 2014-04-23 MED ORDER — MEPERIDINE HCL 50 MG/ML IJ SOLN
6.2500 mg | INTRAMUSCULAR | Status: DC | PRN
  Administered 2014-04-23 (×2): 12.5 mg via INTRAVENOUS

## 2014-04-23 MED ORDER — DIBUCAINE 1 % RE OINT
TOPICAL_OINTMENT | RECTAL | Status: DC | PRN
  Administered 2014-04-23: 1 via RECTAL

## 2014-04-23 MED ORDER — HYDROMORPHONE HCL 1 MG/ML IJ SOLN
INTRAMUSCULAR | Status: AC
  Filled 2014-04-23: qty 1

## 2014-04-23 MED ORDER — RITONAVIR 100 MG PO TABS
100.0000 mg | ORAL_TABLET | Freq: Every day | ORAL | Status: DC
  Administered 2014-04-23 – 2014-04-26 (×4): 100 mg via ORAL
  Filled 2014-04-23 (×7): qty 1

## 2014-04-23 MED ORDER — DIBUCAINE 1 % RE OINT
TOPICAL_OINTMENT | RECTAL | Status: AC
  Filled 2014-04-23: qty 28

## 2014-04-23 MED ORDER — ACETAMINOPHEN 500 MG PO TABS
1000.0000 mg | ORAL_TABLET | Freq: Four times a day (QID) | ORAL | Status: AC
  Administered 2014-04-23 – 2014-04-24 (×3): 1000 mg via ORAL
  Filled 2014-04-23 (×3): qty 2

## 2014-04-23 MED ORDER — LOPERAMIDE HCL 2 MG PO CAPS
2.0000 mg | ORAL_CAPSULE | ORAL | Status: DC | PRN
  Administered 2014-04-23: 2 mg via ORAL
  Filled 2014-04-23: qty 1

## 2014-04-23 MED ORDER — SODIUM CHLORIDE 0.9 % IJ SOLN
INTRAMUSCULAR | Status: AC
  Filled 2014-04-23: qty 10

## 2014-04-23 MED ORDER — LIDOCAINE HCL (CARDIAC) 20 MG/ML IV SOLN
INTRAVENOUS | Status: AC
  Filled 2014-04-23: qty 5

## 2014-04-23 MED ORDER — OXYCODONE-ACETAMINOPHEN 5-325 MG PO TABS
1.0000 | ORAL_TABLET | ORAL | Status: DC | PRN
  Administered 2014-04-23: 2 via ORAL
  Filled 2014-04-23 (×2): qty 2

## 2014-04-23 MED ORDER — LABETALOL HCL 5 MG/ML IV SOLN: 5.0000 mg | INTRAVENOUS | Status: DC | PRN

## 2014-04-23 MED ORDER — FENTANYL CITRATE 0.05 MG/ML IJ SOLN
INTRAMUSCULAR | Status: AC
  Filled 2014-04-23: qty 5

## 2014-04-23 MED ORDER — SODIUM CHLORIDE 0.9 % IV SOLN
INTRAVENOUS | Status: DC
  Administered 2014-04-23: 13:00:00 via INTRAVENOUS
  Administered 2014-04-24: 50 mL/h via INTRAVENOUS

## 2014-04-23 MED ORDER — HYDROMORPHONE HCL 1 MG/ML IJ SOLN
0.2500 mg | INTRAMUSCULAR | Status: DC | PRN
  Administered 2014-04-23 (×4): 0.5 mg via INTRAVENOUS

## 2014-04-23 MED ORDER — HYDROMORPHONE HCL 1 MG/ML IJ SOLN
1.0000 mg | INTRAMUSCULAR | Status: DC | PRN
  Administered 2014-04-23 – 2014-04-25 (×7): 1 mg via INTRAVENOUS
  Filled 2014-04-23 (×7): qty 1

## 2014-04-23 MED ORDER — LIDOCAINE HCL (CARDIAC) 20 MG/ML IV SOLN
INTRAVENOUS | Status: DC | PRN
  Administered 2014-04-23: 80 mg via INTRAVENOUS

## 2014-04-23 MED ORDER — ONDANSETRON HCL 4 MG/2ML IJ SOLN
INTRAMUSCULAR | Status: DC | PRN
  Administered 2014-04-23: 4 mg via INTRAVENOUS

## 2014-04-23 MED ORDER — FENTANYL CITRATE 0.05 MG/ML IJ SOLN: 25.0000 ug | INTRAMUSCULAR | Status: DC | PRN

## 2014-04-23 MED ORDER — BUPIVACAINE LIPOSOME 1.3 % IJ SUSP
20.0000 mL | Freq: Once | INTRAMUSCULAR | Status: AC
  Administered 2014-04-23: 30 mL
  Filled 2014-04-23: qty 20

## 2014-04-23 MED ORDER — PROPOFOL 10 MG/ML IV BOLUS
INTRAVENOUS | Status: AC
  Filled 2014-04-23: qty 20

## 2014-04-23 MED ORDER — FENTANYL CITRATE 0.05 MG/ML IJ SOLN
INTRAMUSCULAR | Status: DC | PRN
  Administered 2014-04-23 (×5): 50 ug via INTRAVENOUS

## 2014-04-23 MED ORDER — LABETALOL HCL 5 MG/ML IV SOLN
5.0000 mg | Freq: Once | INTRAVENOUS | Status: AC
  Administered 2014-04-23: 5 mg via INTRAVENOUS

## 2014-04-23 MED ORDER — LABETALOL HCL 5 MG/ML IV SOLN
INTRAVENOUS | Status: AC
  Filled 2014-04-23: qty 4

## 2014-04-23 MED ORDER — BUPIVACAINE-EPINEPHRINE (PF) 0.5% -1:200000 IJ SOLN
INTRAMUSCULAR | Status: AC
  Filled 2014-04-23: qty 30

## 2014-04-23 MED ORDER — LACTATED RINGERS IV SOLN
INTRAVENOUS | Status: DC | PRN
Start: 2014-04-23 — End: 2014-04-23
  Administered 2014-04-23 (×2): via INTRAVENOUS

## 2014-04-23 MED ORDER — SODIUM CHLORIDE 0.9 % IV SOLN
3.0000 g | Freq: Four times a day (QID) | INTRAVENOUS | Status: DC
  Administered 2014-04-23 (×2): 3 g via INTRAVENOUS
  Filled 2014-04-23 (×3): qty 3

## 2014-04-23 MED ORDER — DARUNAVIR ETHANOLATE 800 MG PO TABS
800.0000 mg | ORAL_TABLET | Freq: Every day | ORAL | Status: DC
  Administered 2014-04-23 – 2014-04-26 (×4): 800 mg via ORAL
  Filled 2014-04-23 (×7): qty 1

## 2014-04-23 MED ORDER — EMTRICITABINE-TENOFOVIR DF 200-300 MG PO TABS
1.0000 | ORAL_TABLET | Freq: Every day | ORAL | Status: DC
  Administered 2014-04-23 – 2014-04-26 (×4): 1 via ORAL
  Filled 2014-04-23 (×5): qty 1

## 2014-04-23 MED ORDER — ONDANSETRON HCL 4 MG/2ML IJ SOLN: 4.0000 mg | Freq: Four times a day (QID) | INTRAMUSCULAR | Status: DC | PRN

## 2014-04-23 MED ORDER — ONDANSETRON HCL 4 MG PO TABS: 4.0000 mg | ORAL_TABLET | Freq: Four times a day (QID) | ORAL | Status: DC | PRN

## 2014-04-23 MED ORDER — SODIUM CHLORIDE 0.9 % IV BOLUS (SEPSIS)
1000.0000 mL | Freq: Once | INTRAVENOUS | Status: AC
  Administered 2014-04-23: 1000 mL via INTRAVENOUS

## 2014-04-23 MED ORDER — LACTATED RINGERS IV SOLN: INTRAVENOUS | Status: DC

## 2014-04-23 MED ORDER — KETOROLAC TROMETHAMINE 30 MG/ML IJ SOLN
INTRAMUSCULAR | Status: DC | PRN
  Administered 2014-04-23: 30 mg via INTRAVENOUS

## 2014-04-23 MED ORDER — EPHEDRINE SULFATE 50 MG/ML IJ SOLN
INTRAMUSCULAR | Status: AC
  Filled 2014-04-23: qty 1

## 2014-04-23 MED ORDER — RITONAVIR 100 MG PO TABS
100.0000 mg | ORAL_TABLET | Freq: Every day | ORAL | Status: DC
  Filled 2014-04-23: qty 1

## 2014-04-23 MED ORDER — MIDAZOLAM HCL 2 MG/2ML IJ SOLN
INTRAMUSCULAR | Status: AC
  Filled 2014-04-23: qty 2

## 2014-04-23 MED ORDER — MEPERIDINE HCL 50 MG/ML IJ SOLN
INTRAMUSCULAR | Status: AC
Start: 2014-04-23 — End: 2014-04-23
  Filled 2014-04-23: qty 1

## 2014-04-23 MED ORDER — MIDAZOLAM HCL 5 MG/5ML IJ SOLN
INTRAMUSCULAR | Status: DC | PRN
  Administered 2014-04-23: 2 mg via INTRAVENOUS

## 2014-04-23 MED ORDER — PROPOFOL 10 MG/ML IV BOLUS
INTRAVENOUS | Status: DC | PRN
  Administered 2014-04-23: 200 mg via INTRAVENOUS

## 2014-04-23 MED ORDER — KETOROLAC TROMETHAMINE 30 MG/ML IJ SOLN
INTRAMUSCULAR | Status: AC
  Filled 2014-04-23: qty 1

## 2014-04-23 MED ORDER — SODIUM CHLORIDE 0.9 % IV BOLUS (SEPSIS)
1000.0000 mL | Freq: Once | INTRAVENOUS | Status: AC
  Administered 2014-04-22 – 2014-04-23 (×2): 1000 mL via INTRAVENOUS

## 2014-04-23 MED ORDER — SODIUM CHLORIDE 0.9 % IJ SOLN
INTRAMUSCULAR | Status: DC | PRN
  Administered 2014-04-23: 10 mL

## 2014-04-23 SURGICAL SUPPLY — 17 items
BLADE SURG SZ10 CARB STEEL (BLADE) ×3 IMPLANT
ELECT REM PT RETURN 9FT ADLT (ELECTROSURGICAL) ×3
ELECTRODE REM PT RTRN 9FT ADLT (ELECTROSURGICAL) ×1 IMPLANT
GAUZE SPONGE 4X4 12PLY STRL (GAUZE/BANDAGES/DRESSINGS) ×3 IMPLANT
GLOVE BIOGEL PI IND STRL 7.0 (GLOVE) IMPLANT
GLOVE BIOGEL PI INDICATOR 7.0 (GLOVE)
GLOVE SURG SIGNA 7.5 PF LTX (GLOVE) ×12 IMPLANT
GOWN STRL REUS W/TWL LRG LVL3 (GOWN DISPOSABLE) ×3 IMPLANT
GOWN STRL REUS W/TWL XL LVL3 (GOWN DISPOSABLE) ×3 IMPLANT
KIT BASIN OR (CUSTOM PROCEDURE TRAY) ×3 IMPLANT
NS IRRIG 1000ML POUR BTL (IV SOLUTION) ×3 IMPLANT
PACK LITHOTOMY IV (CUSTOM PROCEDURE TRAY) ×3 IMPLANT
PENCIL BUTTON HOLSTER BLD 10FT (ELECTRODE) ×3 IMPLANT
SPONGE SURGIFOAM ABS GEL 100 (HEMOSTASIS) ×3 IMPLANT
SUT CHROMIC 2 0 SH (SUTURE) ×6 IMPLANT
TOWEL OR 17X26 10 PK STRL BLUE (TOWEL DISPOSABLE) ×3 IMPLANT
YANKAUER SUCT BULB TIP 10FT TU (MISCELLANEOUS) ×3 IMPLANT

## 2014-04-23 NOTE — Anesthesia Preprocedure Evaluation (Addendum)
Anesthesia Evaluation  Patient identified by MRN, date of birth, ID band Patient awake  General Assessment Comment:HIV positive  Reviewed: Allergy & Precautions, NPO status , Patient's Chart, lab work & pertinent test results  Airway Mallampati: II  TM Distance: >3 FB Neck ROM: Full    Dental  (+) Dental Advisory Given, Poor Dentition Mostly broken teeth at gumline in upper jaw. One broken tooth (? Right upper incisor) visible. Very poor dentition. He denies loose teeth.:   Pulmonary Current Smoker,  breath sounds clear to auscultation  Pulmonary exam normal       Cardiovascular negative cardio ROS  Rhythm:Regular Rate:Normal     Neuro/Psych negative neurological ROS  negative psych ROS   GI/Hepatic negative GI ROS, Neg liver ROS,   Endo/Other  negative endocrine ROS  Renal/GU negative Renal ROS  negative genitourinary   Musculoskeletal negative musculoskeletal ROS (+)   Abdominal   Peds negative pediatric ROS (+)  Hematology negative hematology ROS (+)   Anesthesia Other Findings   Reproductive/Obstetrics negative OB ROS                            Anesthesia Physical Anesthesia Plan  ASA: II  Anesthesia Plan: General   Post-op Pain Management:    Induction: Intravenous  Airway Management Planned: LMA  Additional Equipment:   Intra-op Plan:   Post-operative Plan: Extubation in OR  Informed Consent: I have reviewed the patients History and Physical, chart, labs and discussed the procedure including the risks, benefits and alternatives for the proposed anesthesia with the patient or authorized representative who has indicated his/her understanding and acceptance.   Dental advisory given  Plan Discussed with: CRNA  Anesthesia Plan Comments:         Anesthesia Quick Evaluation

## 2014-04-23 NOTE — Op Note (Signed)
NAMMichail Jewels:  Olivares, Devontae              ACCOUNT NO.:  [id number]  MEDICAL RECORD NO.:  [id number]  LOCATION:  1537                         [facility]  PHYSICIAN:  Abigail Miyamotoouglas Yordan Martindale, M.D. DATE OF BIRTH:  08-May-1960  DATE OF PROCEDURE:  04/23/2014 DATE OF DISCHARGE:                              OPERATIVE REPORT   PREOPERATIVE DIAGNOSIS:  Anal condyloma.  POSTOPERATIVE DIAGNOSIS:  Anal condyloma.  PROCEDURE:  Excision and fulguration of anal condyloma.  SURGEON:  Abigail Miyamotoouglas Breland Elders, M.D.  ANESTHESIA:  General and injectable Exparel.  ESTIMATED BLOOD LOSS:  Minimal.  INDICATIONS:  This is a 54 year old gentleman who is HIV positive who has had anal condyloma excise in the past.  He has now had significant recurrence.  His surgery has been delayed multiple because of infection in other areas and patient noncompliance.  He now presents to the operating room for excision of a condyloma.  FINDINGS:  The patient was found to have circumferential anal condyloma. There was a large firm area posteriorly just to the left of the midline. There was a chronically inflamed tissue with small amount of purulence as well.  This was worrisome for potentially developing malignancy.  PROCEDURE IN DETAIL:  The patient was brought to the operating room, identified as [employer] Re[employer]ello.  He was placed supine on the operating table and general anesthesia was induced.  He was then placed in lithotomy position.  His perianal area was then prepped and draped in usual sterile fashion.  I inserted a retractor into the anal canal.  He had a very large fulgurating condyloma just to the left of the posterior midline.  I grasped it with Allis clamp and excised it with electrocautery.  It appeared to go fairly deeper into the mucosa as suspected.  This was worrisome that he could be developing a malignancy. I try to excise most of the anal condyloma but I am not sure if I was successful or not or not giving the  amount of inflamed tissue as well. There was a small area of induration on the superficial anal skin with some draining purulence.  I excised this area with the cautery and then achieved hemostasis with the cautery further.  I then closed the defect with a running interlocked 2-0 chromic suture.  I then reinforced this with a 2-0 chromic suture.  I then fulgurated the remaining external condyloma which were all quite small circumferentially around the anoderm and perianal skin.  No other intraanal pathology was identified. I then injected Exparel circumferentially.  I placed a piece of Gelfoam into the anal canal.  I then placed an ointment over the wound as well.  Mesh panties were then applied.  The patient tolerated the procedure well.  All the counts were correct at the end of procedure.  The patient was then extubated in the operating room and taken in stable condition to recovery room.     Abigail Miyamotoouglas Zimri Brennen, M.D.     DB/MEDQ  D:  04/23/2014  T:  04/23/2014  Job:  [id number]

## 2014-04-23 NOTE — H&P (Signed)
  [name] is an 54 y.o. male.  Chief Complaint: anal condyloma HPI: this is a patient with HIV and recurrent anal condyloma who presents with worsening perianal pain. Surgery originally cancelled for oral abscess now resolved.  Past Medical History  Diagnosis Date  . HIV (human immunodeficiency virus infection)   . Hemorrhoids   . 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 Tendon repair      2006-lt index  . Hernia repair  97    lt ing  . Wart fulguration  05/31/2011    Procedure: FULGURATION ANAL WART; Surgeon: Shelly Rubensteinouglas A Chananya Canizalez, MD; Location: [facility]; Service: General; Laterality: N/A; excision anal condyloma  . Wart fulguration  07/06/2011    Procedure: FULGURATION ANAL WART; Surgeon: Shelly Rubensteinouglas A Raneshia Derick, MD; Location: [facility]; Service: General; Laterality: N/A; excision anal condyloma    Family History  Problem Relation Age of Onset  . Cancer Mother     brain  . Cancer Sister     breast   Social History: reports that he has been smoking Cigarettes. He has a 12.5 pack-year smoking history. He has never used smokeless tobacco. He reports that he drinks alcohol. He reports that he does not use illicit drugs.  Allergies:  Allergies  Allergen Reactions  . Shellfish Allergy Anaphylaxis and Swelling    Swelling everywhere  . Sulfa Antibiotics Anaphylaxis, Hives, Itching and Swelling    Swelling all over     No prescriptions prior to admission     Lab Results Last 48 Hours    No results found for this or any previous visit (from the past 48 hour(s)).    Imaging Results (Last 48 hours)    No results found.    Review of Systems  All other systems reviewed and are negative.   Height 6' (1.829 m), weight  164 lb (74.39 kg). Physical Exam  Constitutional: He is oriented to person, place, and time. He appears well-developed. No distress.  HENT:  Head: Normocephalic and atraumatic.  Eyes: Pupils are equal, round, and reactive to light.  Neck: Normal range of motion.  Cardiovascular: Normal rate, regular rhythm and normal heart sounds.  Respiratory: Effort normal and breath sounds normal.  GI: Soft. There is no tenderness.  Genitourinary:  Anal condyloma  Musculoskeletal: Normal range of motion.  Neurological: He is alert and oriented to person, place, and time.  Skin: Skin is warm and dry.  Psychiatric: His behavior is normal.    Assessment/Plan Anal condyloma  Will plan excision in the OR of the condyloma. The risks were discussed. These include but are not limited to bleeding, infection, recurrence, finding cancer, need for further surgery, incontinence, DVT, etc. He agrees to proceed.

## 2014-04-23 NOTE — Transfer of Care (Signed)
Immediate Anesthesia Transfer of Care Note  Patient: [name]  Procedure(s) Performed: Procedure(s): EXCISION OF ANAL CONDYLOMA (N/A)  Patient Location: PACU  Anesthesia Type:General  Level of Consciousness:  Awake, patient cooperative and responds to stimulation  Airway & Oxygen Therapy:Patient Spontanous Breathing and Patient connected to face mask oxgen  Post-op Assessment:  Report given to PACU RN and Post -op Vital signs reviewed and stable  Post vital signs:  Reviewed and stable  Last Vitals:  Filed Vitals:   04/23/14 0731  BP: 146/100  Pulse:   Temp:   Resp:     Complications: No apparent anesthesia complications

## 2014-04-23 NOTE — Progress Notes (Addendum)
Pt states he has driver (WilliamButler) i cannot reach this person by phone, so i am unable to confirm this. Also pt states he will have someone to stay with him at home,but he was on cell phone stating to someone that he does not have anyone to stay with him.  To repeat, patient does state to me that he will have someone to stay with him at home. Will notify Dr Magnus IvanBlackman  0801 no new orders from dr Magnus Ivanblackman

## 2014-04-23 NOTE — Anesthesia Postprocedure Evaluation (Signed)
  Anesthesia Post-op Note  Patient: [name]  Procedure(s) Performed: Procedure(s) (LRB): EXCISION OF ANAL CONDYLOMA (N/A)  Patient Location: PACU  Anesthesia Type: General  Level of Consciousness: awake and alert   Airway and Oxygen Therapy: Patient Spontanous Breathing  Post-op Pain: mild  Post-op Assessment: Post-op Vital signs reviewed, Patient's Cardiovascular Status Stable, Respiratory Function Stable, Patent Airway and No signs of Nausea or vomiting  Last Vitals:  Filed Vitals:   04/23/14 0731  BP: 146/100  Pulse:   Temp:   Resp:     Post-op Vital Signs: stable   Complications: No apparent anesthesia complications. BP near baseline but elevated. No h/o hypertension. Treated with labetalol.

## 2014-04-23 NOTE — Op Note (Signed)
EXCISION OF ANAL CONDYLOMA  Procedure Note  [name] 04/23/2014   Pre-op Diagnosis: Anal Condyloma     Post-op Diagnosis: same  Procedure(s): EXCISION OF ANAL CONDYLOMA  Surgeon(s): Abigail Miyamotoouglas Ezzie Senat, MD  Anesthesia: General  Staff:  Circulator: Dominga FerryMelissa J Tewksbury, RN Scrub Person: Audrie GallusMisty M Tuttle, CST  Estimated Blood Loss: Minimal               Specimens: sent to path          [facility],[name]   Date: 04/23/2014  Time: 9:26 AM

## 2014-04-23 NOTE — Anesthesia Procedure Notes (Signed)
Procedure Name: LMA Insertion Date/Time: 04/23/2014 8:49 AM Performed by: Epimenio SarinJARVELA, Ignace Mandigo R Pre-anesthesia Checklist: Patient identified, Emergency Drugs available, Suction available, Patient being monitored and Timeout performed Patient Re-evaluated:Patient Re-evaluated prior to inductionOxygen Delivery Method: Circle system utilized Preoxygenation: Pre-oxygenation with 100% oxygen Intubation Type: IV induction Ventilation: Mask ventilation without difficulty LMA: LMA with gastric port inserted LMA Size: 5.0 Number of attempts: 1 Dental Injury: Teeth and Oropharynx as per pre-operative assessment

## 2014-04-24 ENCOUNTER — Encounter (HOSPITAL_COMMUNITY)
Admission: RE | Disposition: A | Discharge status: Home / Self Care | Payer: Self-pay | Source: Ambulatory Visit | Attending: Surgery

## 2014-04-24 ENCOUNTER — Encounter (HOSPITAL_COMMUNITY): Payer: Self-pay | Admitting: General Surgery

## 2014-04-24 ENCOUNTER — Observation Stay (HOSPITAL_COMMUNITY): Payer: Medicare Other | Admitting: Anesthesiology

## 2014-04-24 DIAGNOSIS — Z882 Allergy status to sulfonamides status: Secondary | ICD-10-CM | POA: Diagnosis not present

## 2014-04-24 DIAGNOSIS — I959 Hypotension, unspecified: Secondary | ICD-10-CM | POA: Diagnosis not present

## 2014-04-24 DIAGNOSIS — Y838 Other surgical procedures as the cause of abnormal reaction of the patient, or of later complication, without mention of misadventure at the time of the procedure: Secondary | ICD-10-CM | POA: Diagnosis not present

## 2014-04-24 DIAGNOSIS — F17210 Nicotine dependence, cigarettes, uncomplicated: Secondary | ICD-10-CM | POA: Diagnosis present

## 2014-04-24 DIAGNOSIS — B20 Human immunodeficiency virus [HIV] disease: Secondary | ICD-10-CM | POA: Diagnosis present

## 2014-04-24 DIAGNOSIS — K91840 Postprocedural hemorrhage and hematoma of a digestive system organ or structure following a digestive system procedure: Secondary | ICD-10-CM | POA: Diagnosis not present

## 2014-04-24 DIAGNOSIS — Y92239 Unspecified place in hospital as the place of occurrence of the external cause: Secondary | ICD-10-CM | POA: Diagnosis not present

## 2014-04-24 DIAGNOSIS — A630 Anogenital (venereal) warts: Secondary | ICD-10-CM | POA: Diagnosis present

## 2014-04-24 DIAGNOSIS — Z91013 Allergy to seafood: Secondary | ICD-10-CM | POA: Diagnosis not present

## 2014-04-24 HISTORY — PX: RECTAL EXAM UNDER ANESTHESIA: SHX6399

## 2014-04-24 LAB — CBC
HCT: 22.9 % — ABNORMAL LOW (ref 39.0–52.0)
HEMATOCRIT: 24.5 % — AB (ref 39.0–52.0)
HEMOGLOBIN: 7.1 g/dL — AB (ref 13.0–17.0)
Hemoglobin: 8.2 g/dL — ABNORMAL LOW (ref 13.0–17.0)
MCH: 27.8 pg (ref 26.0–34.0)
MCH: 28.6 pg (ref 26.0–34.0)
MCHC: 31 g/dL (ref 30.0–36.0)
MCHC: 33.5 g/dL (ref 30.0–36.0)
MCV: 85.4 fL (ref 78.0–100.0)
MCV: 89.8 fL (ref 78.0–100.0)
PLATELETS: 192 10*3/uL (ref 150–400)
Platelets: 125 10*3/uL — ABNORMAL LOW (ref 150–400)
RBC: 2.87 MIL/uL — ABNORMAL LOW (ref 4.22–5.81)
RBC: 2.55 MIL/uL — AB (ref 4.22–5.81)
RDW: 14.6 % (ref 11.5–15.5)
RDW: 16 % — ABNORMAL HIGH (ref 11.5–15.5)
WBC: 12.5 10*3/uL — ABNORMAL HIGH (ref 4.0–10.5)
WBC: 12.1 10*3/uL — AB (ref 4.0–10.5)

## 2014-04-24 LAB — PREPARE RBC (CROSSMATCH)

## 2014-04-24 LAB — LACTIC ACID, PLASMA: LACTIC ACID, VENOUS: 1.2 mmol/L (ref 0.5–2.0)

## 2014-04-24 LAB — ABO/RH: ABO/RH(D): A POS

## 2014-04-24 SURGERY — EXAM UNDER ANESTHESIA, RECTUM
Anesthesia: General | Site: Rectum

## 2014-04-24 MED ORDER — LOPERAMIDE HCL 2 MG PO CAPS: 2.0000 mg | ORAL_CAPSULE | Freq: Once | ORAL | Status: DC

## 2014-04-24 MED ORDER — SODIUM CHLORIDE 0.9 % IV SOLN: 250.0000 mL | INTRAVENOUS | Status: DC | PRN

## 2014-04-24 MED ORDER — LACTATED RINGERS IV SOLN
INTRAVENOUS | Status: DC | PRN
  Administered 2014-04-24: 01:00:00 via INTRAVENOUS

## 2014-04-24 MED ORDER — HYDROCORTISONE ACE-PRAMOXINE 2.5-1 % RE CREA
1.0000 "application " | TOPICAL_CREAM | Freq: Four times a day (QID) | RECTAL | Status: DC
  Administered 2014-04-25 – 2014-04-26 (×5): 1 via RECTAL
  Filled 2014-04-24 (×2): qty 30

## 2014-04-24 MED ORDER — SODIUM CHLORIDE 0.9 % IV SOLN
INTRAVENOUS | Status: DC | PRN
  Administered 2014-04-24: 02:00:00 via INTRAVENOUS

## 2014-04-24 MED ORDER — PHENYLEPHRINE HCL 10 MG/ML IJ SOLN
INTRAMUSCULAR | Status: AC
  Filled 2014-04-24: qty 1

## 2014-04-24 MED ORDER — OXYCODONE-ACETAMINOPHEN 10-325 MG PO TABS
ORAL_TABLET | ORAL | Status: DC
Start: 2014-04-24 — End: 2014-04-26

## 2014-04-24 MED ORDER — DIPHENHYDRAMINE HCL 50 MG/ML IJ SOLN: 12.5000 mg | Freq: Four times a day (QID) | INTRAMUSCULAR | Status: DC | PRN

## 2014-04-24 MED ORDER — SODIUM CHLORIDE 0.9 % IV SOLN: Freq: Once | INTRAVENOUS | Status: DC

## 2014-04-24 MED ORDER — 0.9 % SODIUM CHLORIDE (POUR BTL) OPTIME
TOPICAL | Status: DC | PRN
  Administered 2014-04-24: 1000 mL

## 2014-04-24 MED ORDER — PIPERACILLIN-TAZOBACTAM 3.375 G IVPB
3.3750 g | Freq: Three times a day (TID) | INTRAVENOUS | Status: DC
  Administered 2014-04-24 – 2014-04-26 (×6): 3.375 g via INTRAVENOUS
  Filled 2014-04-24 (×8): qty 50

## 2014-04-24 MED ORDER — GELATIN ABSORBABLE 12-7 MM EX MISC
CUTANEOUS | Status: DC | PRN
  Administered 2014-04-24: 1

## 2014-04-24 MED ORDER — ACETAMINOPHEN 500 MG PO TABS
1000.0000 mg | ORAL_TABLET | Freq: Three times a day (TID) | ORAL | Status: DC
  Administered 2014-04-24 – 2014-04-26 (×5): 1000 mg via ORAL
  Filled 2014-04-24 (×8): qty 2

## 2014-04-24 MED ORDER — LACTATED RINGERS IV BOLUS (SEPSIS): 1000.0000 mL | Freq: Three times a day (TID) | INTRAVENOUS | Status: DC | PRN

## 2014-04-24 MED ORDER — DIBUCAINE 1 % RE OINT
TOPICAL_OINTMENT | RECTAL | Status: DC | PRN
  Administered 2014-04-24: 1 via RECTAL

## 2014-04-24 MED ORDER — OXYCODONE HCL 5 MG PO TABS
5.0000 mg | ORAL_TABLET | ORAL | Status: DC | PRN
  Administered 2014-04-24 (×2): 5 mg via ORAL
  Administered 2014-04-25 – 2014-04-26 (×7): 10 mg via ORAL
  Filled 2014-04-24 (×4): qty 2
  Filled 2014-04-24: qty 1
  Filled 2014-04-24 (×3): qty 2
  Filled 2014-04-24: qty 1

## 2014-04-24 MED ORDER — FENTANYL CITRATE (PF) 100 MCG/2ML IJ SOLN
INTRAMUSCULAR | Status: DC | PRN
  Administered 2014-04-24: 100 ug via INTRAVENOUS

## 2014-04-24 MED ORDER — WITCH HAZEL-GLYCERIN EX PADS: 1.0000 "application " | MEDICATED_PAD | CUTANEOUS | Status: DC | PRN

## 2014-04-24 MED ORDER — LIDOCAINE HCL (CARDIAC) 20 MG/ML IV SOLN
INTRAVENOUS | Status: AC
  Filled 2014-04-24: qty 5

## 2014-04-24 MED ORDER — LIDOCAINE 5 % EX OINT: 1.0000 "application " | TOPICAL_OINTMENT | CUTANEOUS | Status: DC | PRN

## 2014-04-24 MED ORDER — SODIUM CHLORIDE 0.9 % IV SOLN
Freq: Once | INTRAVENOUS | Status: AC
  Administered 2014-04-24: 10 mL/h via INTRAVENOUS

## 2014-04-24 MED ORDER — PHENYLEPHRINE HCL 10 MG/ML IJ SOLN
INTRAMUSCULAR | Status: DC | PRN
  Administered 2014-04-24: 80 ug via INTRAVENOUS

## 2014-04-24 MED ORDER — SODIUM CHLORIDE 0.9 % IV SOLN
Freq: Once | INTRAVENOUS | Status: AC
  Administered 2014-04-25: 10 mL/h via INTRAVENOUS

## 2014-04-24 MED ORDER — SODIUM CHLORIDE 0.9 % IJ SOLN: 3.0000 mL | INTRAMUSCULAR | Status: DC | PRN

## 2014-04-24 MED ORDER — FENTANYL CITRATE (PF) 100 MCG/2ML IJ SOLN
INTRAMUSCULAR | Status: AC
  Filled 2014-04-24: qty 2

## 2014-04-24 MED ORDER — PROPOFOL 10 MG/ML IV BOLUS
INTRAVENOUS | Status: AC
  Filled 2014-04-24: qty 20

## 2014-04-24 MED ORDER — SODIUM CHLORIDE 0.9 % IV SOLN
Freq: Once | INTRAVENOUS | Status: AC
  Administered 2014-04-24: 13:00:00 via INTRAVENOUS

## 2014-04-24 MED ORDER — ETOMIDATE 2 MG/ML IV SOLN
INTRAVENOUS | Status: DC | PRN
  Administered 2014-04-24: 16 mg via INTRAVENOUS

## 2014-04-24 MED ORDER — DIBUCAINE 1 % RE OINT
TOPICAL_OINTMENT | RECTAL | Status: AC
  Filled 2014-04-24: qty 28

## 2014-04-24 MED ORDER — SUCCINYLCHOLINE CHLORIDE 20 MG/ML IJ SOLN
INTRAMUSCULAR | Status: DC | PRN
  Administered 2014-04-24: 100 mg via INTRAVENOUS

## 2014-04-24 MED ORDER — SODIUM CHLORIDE 0.9 % IJ SOLN
3.0000 mL | Freq: Two times a day (BID) | INTRAMUSCULAR | Status: DC
  Administered 2014-04-24 – 2014-04-26 (×4): 3 mL via INTRAVENOUS

## 2014-04-24 MED ORDER — LIDOCAINE HCL (CARDIAC) 20 MG/ML IV SOLN
INTRAVENOUS | Status: DC | PRN
  Administered 2014-04-24: 50 mg via INTRAVENOUS

## 2014-04-24 SURGICAL SUPPLY — 10 items
ELECT PENCIL ROCKER SW 15FT (MISCELLANEOUS) ×3 IMPLANT
ELECT REM PT RETURN 9FT ADLT (ELECTROSURGICAL) ×3
ELECTRODE REM PT RTRN 9FT ADLT (ELECTROSURGICAL) ×1 IMPLANT
KIT BASIN OR (CUSTOM PROCEDURE TRAY) ×3 IMPLANT
PACK BASIC (CUSTOM PROCEDURE TRAY) ×3 IMPLANT
PAD ABD 8X10 STRL (GAUZE/BANDAGES/DRESSINGS) ×3 IMPLANT
SHEET LAVH (DRAPES) ×3 IMPLANT
SPONGE SURGIFOAM ABS GEL 100 (HEMOSTASIS) ×3 IMPLANT
SUT CHROMIC 3 0 SH 27 (SUTURE) ×6 IMPLANT
YANKAUER SUCT BULB TIP 10FT TU (MISCELLANEOUS) ×3 IMPLANT

## 2014-04-24 NOTE — Progress Notes (Signed)
Pt reports feeling weak, still having bloody stools. Ordered another liter of NS bolus, stat CBC. Called rapid reponse. Once received Cbc, notified MD, pt transferred to ICU.

## 2014-04-24 NOTE — Progress Notes (Signed)
After the bolus pt's bp increased to 130/74. Pt still having bloody stools. Will continue to monitor,.

## 2014-04-24 NOTE — Progress Notes (Signed)
Pt having intemittent rectal bleeding Pt's bp had dropped to  109/70, P 88. Pt stated that every time he has a stool, he has bleeding from his rectum. Also said that the Unasyn antibiotic was upsetting his stomach, and that was causing him to have a lot of stools.Notified MD on call, received order to d/c Unasyn, give a one time dose of Imodium to slow his bowels, and give him a 1 liter NS Bolus. Also notified her that pt's Hgb was 1940 was 11.1. Received order for CBC in am.

## 2014-04-24 NOTE — Progress Notes (Signed)
UR completed 

## 2014-04-24 NOTE — Progress Notes (Signed)
ANTIBIOTIC CONSULT NOTE - INITIAL  Pharmacy Consult for Zosyn Indication: leukocytosis following surgery  Allergies  Allergen Reactions  . Shellfish Allergy Anaphylaxis and Swelling    Swelling everywhere  . Sulfa Antibiotics Anaphylaxis, Hives, Itching and Swelling    Swelling all over     Patient Measurements: Height: 6' (182.9 cm) Weight: 162 lb (73.483 kg) IBW/kg (Calculated) : 77.6  Vital Signs: Temp: 99 F (37.2 C) (04/15 0630) Temp Source: Oral (04/15 0630) BP: 110/67 mmHg (04/15 0700) Pulse Rate: 97 (04/15 0700) Intake/Output from previous day: 04/14 0701 - 04/15 0700 In: 10404.2 [P.O.:1320; I.V.:5654.2; Blood:1430; IV Piggyback:2000] Out: 1350 [Urine:750; Blood:600] Intake/Output from this shift:    Labs:  Recent Labs  04/23/14 0715 04/23/14 1940 04/23/14 2349  WBC 6.1 12.5* 12.1*  HGB 13.3 11.1* 7.1*  PLT 292 286 192  CREATININE 1.24  --   --    Estimated Creatinine Clearance: 71.6 mL/min (by C-G formula based on Cr of 1.24). No results for input(s): VANCOTROUGH, VANCOPEAK, VANCORANDOM, GENTTROUGH, GENTPEAK, GENTRANDOM, TOBRATROUGH, TOBRAPEAK, TOBRARND, AMIKACINPEAK, AMIKACINTROU, AMIKACIN in the last 72 hours.   Microbiology: No results found for this or any previous visit (from the past 720 hour(s)).  Medical History: Past Medical History  Diagnosis Date  . HIV (human immunodeficiency virus infection)   . Hemorrhoids   . 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  Patient experienced intermittent rectal bleeding postop and returned to OR 4/15 for ligation of bleeding vessels.  WBC elevated.  Pharmacy consulted to dose Zosyn for possible infection.    4/15 >> Zosyn >>  Tmax: 99.6 WBC: 12.1 Renal: SCr 1.24, CrCl~71 ml/min  No micro data.  Goal of Therapy:  Doses adjusted per renal function Eradication  of infection  Plan:  Zosyn 3.375g IV q8h (4 hour infusion time).   Clance BollRunyon, Johanna Stafford 04/24/2014,8:26 AM

## 2014-04-24 NOTE — Progress Notes (Signed)
Day of Surgery  Subjective: Returned to the OR last night for bleeding at surgical site Stable this morning  Objective: Vital signs in last 24 hours: Temp:  [97.5 F (36.4 C)-99.6 F (37.6 C)] 99 F (37.2 C) (04/15 0630) Pulse Rate:  [70-109] 97 (04/15 0700) Resp:  [14-22] 15 (04/15 0700) BP: (67-164)/(41-110) 110/67 mmHg (04/15 0700) SpO2:  [97 %-100 %] 98 % (04/15 0700) Last BM Date: 04/24/14  Intake/Output from previous day: 04/14 0701 - 04/15 0700 In: 10404.2 [P.O.:1320; I.V.:5654.2; Blood:1430; IV Piggyback:2000] Out: 1350 [Urine:750; Blood:600] Intake/Output this shift:    On toilet.  No gross bleeding.  Lab Results:   Recent Labs  04/23/14 1940 04/23/14 2349  WBC 12.5* 12.1*  HGB 11.1* 7.1*  HCT 36.3* 22.9*  PLT 286 192   BMET  Recent Labs  04/23/14 0715  NA 137  K 3.7  CL 102  CO2 28  GLUCOSE 87  BUN 13  CREATININE 1.24  CALCIUM 8.8   PT/INR No results for input(s): LABPROT, INR in the last 72 hours. ABG No results for input(s): PHART, HCO3 in the last 72 hours.  Invalid input(s): PCO2, PO2  Studies/Results: No results found.  Anti-infectives: Anti-infectives    Start     Dose/Rate Route Frequency Ordered Stop   04/23/14 1800  ritonavir (NORVIR) tablet 100 mg  Status:  Discontinued     100 mg Oral Daily 04/23/14 1119 04/23/14 1124   04/23/14 1800  Darunavir Ethanolate (PREZISTA) tablet 800 mg     800 mg Oral Daily with breakfast 04/23/14 1119     04/23/14 1800  emtricitabine-tenofovir (TRUVADA) 200-300 MG per tablet 1 tablet     1 tablet Oral Daily 04/23/14 1119     04/23/14 1800  ritonavir (NORVIR) tablet 100 mg     100 mg Oral Daily with breakfast 04/23/14 1124     04/23/14 1200  Ampicillin-Sulbactam (UNASYN) 3 g in sodium chloride 0.9 % 100 mL IVPB  Status:  Discontinued     3 g 100 mL/hr over 60 Minutes Intravenous Every 6 hours 04/23/14 1119 04/23/14 2151      Assessment/Plan: s/p Procedure(s): , ligation of bleeding vessels  (N/A)  Will repeat CBC at noon I explained to him the OR findings.  I am worried this may represent cancer but still could just be chronic infection.     Alisi Lupien A 04/24/2014

## 2014-04-24 NOTE — Progress Notes (Signed)
Patient ID: [name], male   DOB: 11-20-1960, 54 y.o.   MRN: [id number]  No further bleeding.  Had a BM  Will transfer to floor.  Will give one more unit of PRBC

## 2014-04-24 NOTE — Anesthesia Procedure Notes (Signed)
Procedure Name: Intubation Date/Time: 04/24/2014 1:24 AM Performed by: Leroy LibmanEARDON, Orlyn Odonoghue L Patient Re-evaluated:Patient Re-evaluated prior to inductionOxygen Delivery Method: Circle system utilized Preoxygenation: Pre-oxygenation with 100% oxygen Intubation Type: IV induction, Cricoid Pressure applied and Rapid sequence Laryngoscope Size: Miller and 3 Grade View: Grade I Tube type: Oral Tube size: 7.5 mm Number of attempts: 1 Airway Equipment and Method: Stylet Placement Confirmation: ETT inserted through vocal cords under direct vision,  positive ETCO2 and breath sounds checked- equal and bilateral Secured at: 22 cm Tube secured with: Tape Dental Injury: Teeth and Oropharynx as per pre-operative assessment

## 2014-04-24 NOTE — Anesthesia Preprocedure Evaluation (Signed)
Anesthesia Evaluation  Patient identified by MRN, date of birth, ID band Patient awake  General Assessment Comment:HIV positive  Reviewed: Allergy & Precautions, NPO status , Patient's Chart, lab work & pertinent test results  Airway Mallampati: I  TM Distance: >3 FB Neck ROM: Full    Dental  (+) Dental Advisory Given, Poor Dentition Mostly broken teeth at gumline in upper jaw. One broken tooth (? Right upper incisor) visible. Very poor dentition. He denies loose teeth.:   Pulmonary Current Smoker,  breath sounds clear to auscultation  Pulmonary exam normal       Cardiovascular negative cardio ROS  Rhythm:Regular Rate:Normal     Neuro/Psych negative neurological ROS  negative psych ROS   GI/Hepatic negative GI ROS, Neg liver ROS,   Endo/Other  negative endocrine ROS  Renal/GU      Musculoskeletal   Abdominal   Peds  Hematology negative hematology ROS (+)   Anesthesia Other Findings   Reproductive/Obstetrics negative OB ROS                             Anesthesia Physical Anesthesia Plan  ASA: III and emergent  Anesthesia Plan: General ETT   Post-op Pain Management:    Induction: Intravenous  Airway Management Planned: Oral ETT  Additional Equipment:   Intra-op Plan:   Post-operative Plan: Extubation in OR  Informed Consent: I have reviewed the patients History and Physical, chart, labs and discussed the procedure including the risks, benefits and alternatives for the proposed anesthesia with the patient or authorized representative who has indicated his/her understanding and acceptance.     Plan Discussed with: CRNA and Surgeon  Anesthesia Plan Comments:         Anesthesia Quick Evaluation

## 2014-04-24 NOTE — Op Note (Signed)
PRE-OPERATIVE DIAGNOSIS: rectal bleeding s/p EUA, excision of anal mass, and fulguration of anal condyloma  POST-OPERATIVE DIAGNOSIS:  Same  PROCEDURE:  Procedure(s): EUA, oversew bleeding vessel  SURGEON:  Surgeon(s): Almond LintFaera Barba Solt, MD  ANESTHESIA:   general  DRAINS: none   LOCAL MEDICATIONS USED:  NONE  SPECIMEN:  No Specimen  DISPOSITION OF SPECIMEN:  N/A  COUNTS:  YES  DICTATION: .Dragon Dictation  PLAN OF CARE: Admit to inpatient   PATIENT DISPOSITION:  ICU, extubated, getting blood  FINDINGS:  Proximal portion of posterior excision was bleeding.    EBL: minimal surgical blood loss, around 1 L blood loss from clots coming from rectum preoperatively.    PROCEDURE:   Patient was identified in the holding area and taken the operating room where he was placed supine on the operating room table. General anesthesia was induced. The patient was placed into low lithotomy position. His perianal area was prepped and draped in standard fashion. Timeout was performed according to the surgical safety checklist. When all was correct, we continued.  He had significant amount of clotting coming from his rectum. A small anal speculum was inserted into his rectum and the blood was suctioned. A laparotomy sponge was inserted into the anus and pressed down posteriorly. The bullet anoscope was then used to identify the source of bleeding. The area of bleeding did seem to be the proximal aspect of the posterior defect. It appeared that this was tight and the suture had pulled out. The proximal aspect was suture ligated with a 3-0 chromic. An additional 3-0 chromic was then run for approximately a centimeter and a half of the defect. The distal aspect of the defect was not closed as it was tight. There was another small area of bleeding on the left lateral aspect that was closed in figure-of-eight fashion.   The area was reinspected several times over the next 10 minutes. There was no evidence of  continued bleeding. Gelfoam and dibucaine ointment were placed in the anal canal. The patient then had mesh underwear and ABDs used for dressing. He was awakened from anesthesia and taken to the ICU extubated. Needle, sponge, and instrument counts were correct.

## 2014-04-24 NOTE — Interval H&P Note (Signed)
History and Physical Interval Note:  04/24/2014 12:48 AM  [name]  has presented today for surgery, with the diagnosis of rectal bleeding.  The various methods of treatment have been discussed with the patient and family. After consideration of risks, benefits and other options for treatment, the patient has consented to  Procedure(s): RECTAL EXAM UNDER ANESTHESIA (N/A) as a surgical intervention .  The patient's history has been reviewed, patient examined, no change in status, stable for surgery.  I have reviewed the patient's chart and labs.  Questions were answered to the patient's satisfaction.     [name]

## 2014-04-24 NOTE — Transfer of Care (Signed)
Immediate Anesthesia Transfer of Care Note  Patient: [name]  Procedure(s) Performed: Procedure(s): , ligation of bleeding vessels (N/A)  Patient Location: ICU  Anesthesia Type:General  Level of Consciousness: awake, alert  and oriented  Airway & Oxygen Therapy: Patient Spontanous Breathing and Patient connected to face mask oxygen  Post-op Assessment: Report given to RN and Post -op Vital signs reviewed and stable  Post vital signs: Reviewed and stable  Last Vitals:  Filed Vitals:   04/24/14 0050  BP: 93/60  Pulse: 101  Temp:   Resp: 15    Complications: No apparent anesthesia complications

## 2014-04-24 NOTE — Progress Notes (Signed)
eLink Physician-Brief Progress Note Patient Name: Valentino Nosenthony Senk DOB: 07/24/1960 MRN: [id number]   Date of Service  04/24/2014  HPI/Events of Note  Arrived from floor, bleeding on going from below HR 107, MAP jst back 68, pt awake Being type and crossed stat, Gen surgeon en rout  eICU Interventions  Send type and Tx 2 units STAT as able, may need to change to emergnency relase Send LA Tele Bolus      Intervention Category Major Interventions: Hypotension - evaluation and management;Hypovolemia - evaluation and treatment with fluids  Nelda BucksFEINSTEIN,Kiaraliz Rafuse J. 04/24/2014, 12:41 AM

## 2014-04-25 MED ORDER — HYDROMORPHONE HCL 1 MG/ML IJ SOLN: 1.0000 mg | INTRAMUSCULAR | Status: DC | PRN

## 2014-04-25 NOTE — Progress Notes (Signed)
Currently in BR taking sitz bath. Pt reported it was the third one for the day. Encouraged and reassured.

## 2014-04-25 NOTE — Progress Notes (Signed)
1 Day Post-Op  Subjective: Still having a lot of pain.  No significant bleeding.  Had a BM yesterday.  Pathology showed anal condyloma-discussed with him.  Objective: Vital signs in last 24 hours: Temp:  [97.8 F (36.6 C)-100.6 F (38.1 C)] 99 F (37.2 C) (04/16 0650) Pulse Rate:  [82-92] 85 (04/16 0650) Resp:  [15-23] 18 (04/16 0650) BP: (98-147)/(56-92) 139/92 mmHg (04/16 0650) SpO2:  [95 %-100 %] 99 % (04/16 0650) Last BM Date: 04/24/14  Intake/Output from previous day: 04/15 0701 - 04/16 0700 In: 2900 [P.O.:1920; I.V.:600; Blood:330; IV Piggyback:50] Out: 2750 [Urine:2750] Intake/Output this shift:    PE: General- In NAD Anorectal-dried bloody drainage around anus  Lab Results:   Recent Labs  04/23/14 2349 04/24/14 1023  WBC 12.1* 12.5*  HGB 7.1* 8.2*  HCT 22.9* 24.5*  PLT 192 125*   BMET  Recent Labs  04/23/14 0715  NA 137  K 3.7  CL 102  CO2 28  GLUCOSE 87  BUN 13  CREATININE 1.24  CALCIUM 8.8   PT/INR No results for input(s): LABPROT, INR in the last 72 hours. Comprehensive Metabolic Panel:    Component Value Date/Time   NA 137 04/23/2014 0715   NA 139 11/24/2013 1611   K 3.7 04/23/2014 0715   K 4.4 11/24/2013 1611   CL 102 04/23/2014 0715   CL 103 11/24/2013 1611   CO2 28 04/23/2014 0715   CO2 27 11/24/2013 1611   BUN 13 04/23/2014 0715   BUN 11 11/24/2013 1611   CREATININE 1.24 04/23/2014 0715   CREATININE 1.09 11/24/2013 1611   CREATININE 1.21 04/16/2013 1057   CREATININE 1.01 09/10/2011 1825   GLUCOSE 87 04/23/2014 0715   GLUCOSE 83 11/24/2013 1611   CALCIUM 8.8 04/23/2014 0715   CALCIUM 9.4 11/24/2013 1611   AST 30 11/24/2013 1611   AST 41* 04/16/2013 1057   ALT 19 11/24/2013 1611   ALT 25 04/16/2013 1057   ALKPHOS 47 11/24/2013 1611   ALKPHOS 42 04/16/2013 1057   BILITOT 0.6 11/24/2013 1611   BILITOT 0.3 04/16/2013 1057   PROT 7.9 11/24/2013 1611   PROT 7.4 04/16/2013 1057   ALBUMIN 4.2 11/24/2013 1611   ALBUMIN 4.3  04/16/2013 1057     Studies/Results: No results found.  Anti-infectives: Anti-infectives    Start     Dose/Rate Route Frequency Ordered Stop   04/24/14 1000  piperacillin-tazobactam (ZOSYN) IVPB 3.375 g     3.375 g 12.5 mL/hr over 240 Minutes Intravenous Every 8 hours 04/24/14 0825     04/23/14 1800  ritonavir (NORVIR) tablet 100 mg  Status:  Discontinued     100 mg Oral Daily 04/23/14 1119 04/23/14 1124   04/23/14 1800  Darunavir Ethanolate (PREZISTA) tablet 800 mg     800 mg Oral Daily with breakfast 04/23/14 1119     04/23/14 1800  emtricitabine-tenofovir (TRUVADA) 200-300 MG per tablet 1 tablet     1 tablet Oral Daily 04/23/14 1119     04/23/14 1800  ritonavir (NORVIR) tablet 100 mg     100 mg Oral Daily with breakfast 04/23/14 1124     04/23/14 1200  Ampicillin-Sulbactam (UNASYN) 3 g in sodium chloride 0.9 % 100 mL IVPB  Status:  Discontinued     3 g 100 mL/hr over 60 Minutes Intravenous Every 6 hours 04/23/14 1119 04/23/14 2151      Assessment Active Problems:   Anal condyloma s/p excision and fulguration 04/23/14 with takeback for bleeding 4/15-no further bleeding; still  requiring IV narcotic for pain control   HIV disease-on home meds.   LOS: 1 day   Plan: Try to transition to oral pain meds.  Start sitz bath.   [name] 04/25/2014

## 2014-04-26 MED ORDER — AMOXICILLIN-POT CLAVULANATE 875-125 MG PO TABS: 1.0000 | ORAL_TABLET | Freq: Two times a day (BID) | ORAL | Status: DC

## 2014-04-26 MED ORDER — OXYCODONE HCL 5 MG PO TABS: 5.0000 mg | ORAL_TABLET | ORAL | Status: DC | PRN

## 2014-04-26 NOTE — Discharge Instructions (Signed)
ANORECTAL SURGERY:  POST OPERATIVE INSTRUCTIONS  1. Take your usually prescribed home medications unless otherwise directed. 2. DIET: Follow a light bland diet the first 24 hours after arrival home, such as soup, liquids, crackers, etc.  Be sure to include lots of fluids daily.  Avoid fast food or heavy meals as your are more likely to get nauseated.  Eat a low fat the next few days after surgery.   3. PAIN CONTROL: a. Pain is best controlled by a usual combination of three different methods TOGETHER: i. Ice/Heat ii. Over the counter pain medication iii. Prescription pain medication b. Most patients will experience some swelling and discomfort in the anus/rectal area. and incisions.  Ice packs or heat (30-60 minutes up to 6 times a day) will help. Use ice for the first few days to help decrease swelling and bruising, then switch to heat such as warm towels, sitz baths, warm baths, etc to help relax tight/sore spots and speed recovery.  Some people prefer to use ice alone, heat alone, alternating between ice & heat.  Experiment to what works for you.  Swelling and bruising can take several weeks to resolve.   c. It is helpful to take an over-the-counter pain medication regularly for the first few weeks.  Choose one of the following that works best for you: i. Naproxen (Aleve, etc)  Two 220mg  tabs twice a day ii. Ibuprofen (Advil, etc) Three 200mg  tabs four times a day (every meal & bedtime) iii. Acetaminophen (Tylenol, etc) 500-650mg  four times a day (every meal & bedtime) d. A  prescription for pain medication (such as oxycodone, hydrocodone, etc) should be given to you upon discharge.  Take your pain medication as prescribed.  i. If you are having problems/concerns with the prescription medicine (does not control pain, nausea, vomiting, rash, itching, etc), please call us [phone] to see if we need to switch you to a different pain medicine that will work better for you and/or control your  side effect better. ii. If you need a refill on your pain medication, please contact your pharmacy.  They will contact our office to request authorization. Prescriptions will not be filled after 5 pm or on week-ends.  Use a Sitz Bath 4-8 times a day for relief A sitz bath is a warm water bath taken in the sitting position that covers only the hips and buttocks. It may be used for either healing or hygiene purposes. Sitz baths are also used to relieve pain, itching, or muscle spasms. The water may contain medicine. Moist heat will help you heal and relax.  HOME CARE INSTRUCTIONS  Take 3 to 4 sitz baths a day.  Fill the bathtub half full with warm water.  Sit in the water and open the drain a little.  Turn on the warm water to keep the tub half full. Keep the water running constantly.  Soak in the water for 15 to 20 minutes.  After the sitz bath, pat the affected area dry first. SEEK MEDICAL CARE IF:  You get worse instead of better. Stop the sitz baths if you get worse.   4. KEEP YOUR BOWELS REGULAR a. The goal is one bowel movement a day b. Avoid getting constipated.  Between the surgery and the pain medications, it is common to experience some constipation.  Increasing fluid intake and taking a fiber supplement (such as Metamucil, Citrucel, FiberCon, MiraLax, etc) 1-2 times a day regularly will usually help prevent this problem from occurring.  A  mild laxative (prune juice, Milk of Magnesia, MiraLax, etc) should be taken according to package directions if there are no bowel movements after 48 hours.  Take a stool softener twice a day. c. Watch out for diarrhea.  If you have many loose bowel movements, simplify your diet to bland foods & liquids for a few days.  Stop any stool softeners and decrease your fiber supplement.  Switching to mild anti-diarrheal medications (Kayopectate, Pepto Bismol) can help.  If this worsens or does not improve, please call us.  5. Wound Care a. Remove your  bandages the day after surgery.  Unless discharge instructions indicate otherwise, leave your bandage dry and in place overnight.  Remove the bandage during your first bowel movement.   b. Allow the wound packing to fall out over the next few days.  You can trim exposed gauze / ribbon as it falls out.  You do not need to repack the wound unless instructed otherwise.  Wear an absorbent pad or soft cotton gauze in your underwear as needed to catch any drainage and help keep the area  c. Keep the area clean and dry.  Bathe / shower every day.  Keep the area clean by showering / bathing over the incision / wound.   It is okay to soak an open wound to help wash it.  Wet wipes or showers / gentle washing after bowel movements is often less traumatic than regular toilet paper. d. Bonita Quin may have some styrofoam-like soft packing in the rectum which will come out with the first bowel movement.  e. You will often notice bleeding with bowel movements.  This should slow down by the end of the first week of surgery f. Expect some drainage.  This should slow down, too, by the end of the first week of surgery.  Wear an absorbent pad or soft cotton gauze in your underwear until the drainage stops. 6. ACTIVITIES as tolerated:   a. You may resume regular (light) daily activities beginning the next day--such as daily self-care, walking, climbing stairs--gradually increasing activities as tolerated.  If you can walk 30 minutes without difficulty, it is safe to try more intense activity such as jogging, treadmill, bicycling, low-impact aerobics, swimming, etc. b. Save the most intensive and strenuous activity for last such as sit-ups, heavy lifting, contact sports, etc  Refrain from any heavy lifting or straining until you are off narcotics for pain control.   c. DO NOT PUSH THROUGH PAIN.  Let pain be your guide: If it hurts to do something, don't do it.  Pain is your body warning you to avoid that activity for another week until  the pain goes down. d. You may drive when you are no longer taking prescription pain medication, you can comfortably sit for long periods of time, and you can safely maneuver your car and apply brakes. e. Bonita Quin may have sexual intercourse when it is comfortable.  7. FOLLOW UP in our office a. Please call [facility] at [phone] to set up an appointment to see your surgeon in the office for a follow-up appointment approximately 2 weeks after your surgery. b. Make sure that you call for this appointment the day you arrive home to insure a convenient appointment time. 10. IF YOU HAVE DISABILITY OR FAMILY LEAVE FORMS, BRING THEM TO THE OFFICE FOR PROCESSING.  DO NOT GIVE THEM TO YOUR DOCTOR.        WHEN TO CALL us [phone]: 1. Poor pain control 2. Reactions /  problems with new medications (rash/itching, nausea, etc)  3. Fever over 101.5 F (38.5 C) 4. Inability to urinate 5. Nausea and/or vomiting 6. Worsening swelling or bruising 7. Continued bleeding from incision. 8. Increased pain, redness, or drainage from the incision  The clinic staff is available to answer your questions during regular business hours (8:30am-5pm).  Please dont hesitate to call and ask to speak to one of our nurses for clinical concerns.   A surgeon from [facility] is always on call at the hospitals   If you have a medical emergency, go to the nearest emergency room or call 911.    [facility] [address], [address], [place], [place]  [address] ? MAIN: [phone] ? TOLL FREE: [phone] ? FAX [phone] [email or web address]  Managing Pain  Pain after surgery or related to activity is often due to strain/injury to muscle, tendon, nerves and/or incisions.  This pain is usually short-term and will improve in a few months.   Many people find it helpful to do the following things TOGETHER to help speed the process of healing and to get back to regular  activity more quickly:  1. Avoid heavy physical activity at first a. No lifting greater than 20 pounds at first, then increase to lifting as tolerated over the next few weeks b. Do not push through the pain.  Listen to your body and avoid positions and maneuvers than reproduce the pain.  Wait a few days before trying something more intense c. Walking is okay as tolerated, but go slowly and stop when getting sore.  If you can walk 30 minutes without stopping or pain, you can try more intense activity (running, jogging, aerobics, cycling, swimming, treadmill, sex, sports, weightlifting, etc ) d. Remember: If it hurts to do it, then dont do it!  2. Take Anti-inflammatory medication a. Choose ONE of the following over-the-counter medications: i.            Acetaminophen 500mg  tabs (Tylenol) 1-2 pills with every meal and just before bedtime (avoid if you have liver problems) ii.            Naproxen 220mg  tabs (ex. Aleve) 1-2 pills twice a day (avoid if you have kidney, stomach, IBD, or bleeding problems) iii. Ibuprofen 200mg  tabs (ex. Advil, Motrin) 3-4 pills with every meal and just before bedtime (avoid if you have kidney, stomach, IBD, or bleeding problems) b. Take with food/snack around the clock for 1-2 weeks i. This helps the muscle and nerve tissues become less irritable and calm down faster  3. Use a Heating pad or Ice/Cold Pack a. 4-6 times a day b. May use warm bath/hottub  or showers  4. Try Gentle Massage and/or Stretching  a. at the area of pain many times a day b. stop if you feel pain - do not overdo it  Try these steps together to help you body heal faster and avoid making things get worse.  Doing just one of these things may not be enough.    If you are not getting better after two weeks or are noticing you are getting worse, contact our office for further advice; we may need to re-evaluate you & see what other things we can do to help.  GETTING TO GOOD BOWEL  HEALTH. Irregular bowel habits such as constipation and diarrhea can lead to many problems over time.  Having one soft bowel movement a day is the most important way to prevent further  problems.  The anorectal canal is designed to handle stretching and feces to safely manage our ability to get rid of solid waste (feces, poop, stool) out of our body.  BUT, hard constipated stools can act like ripping concrete bricks and diarrhea can be a burning fire to this very sensitive area of our body, causing inflamed hemorrhoids, anal fissures, increasing risk is perirectal abscesses, abdominal pain/bloating, an making irritable bowel worse.     The goal: ONE SOFT BOWEL MOVEMENT A DAY!  To have soft, regular bowel movements:   Drink at least 8 tall glasses of water a day.    Take plenty of fiber.  Fiber is the undigested part of plant food that passes into the colon, acting s natures broom to encourage bowel motility and movement.  Fiber can absorb and hold large amounts of water. This results in a larger, bulkier stool, which is soft and easier to pass. Work gradually over several weeks up to 6 servings a day of fiber (25g a day even more if needed) in the form of: o Vegetables -- Root (potatoes, carrots, turnips), leafy green (lettuce, salad greens, celery, spinach), or cooked high residue (cabbage, broccoli, etc) o Fruit -- Fresh (unpeeled skin & pulp), Dried (prunes, apricots, cherries, etc ),  or stewed ( applesauce)  o Whole grain breads, pasta, etc (whole wheat)  o Bran cereals   Bulking Agents -- This type of water-retaining fiber generally is easily obtained each day by one of the following:  o Psyllium bran -- The psyllium plant is remarkable because its ground seeds can retain so much water. This product is available as Metamucil, Konsyl, Effersyllium, Per Diem Fiber, or the less expensive generic preparation in drug and health food stores. Although labeled a laxative, it really is not a laxative.   o Methylcellulose -- This is another fiber derived from wood which also retains water. It is available as Citrucel. o Polyethylene Glycol - and artificial fiber commonly called Miralax or Glycolax.  It is helpful for people with gassy or bloated feelings with regular fiber o Flax Seed - a less gassy fiber than psyllium  No reading or other relaxing activity while on the toilet. If bowel movements take longer than 5 minutes, you are too constipated  AVOID CONSTIPATION.  High fiber and water intake usually takes care of this.  Sometimes a laxative is needed to stimulate more frequent bowel movements, but   Laxatives are not a good long-term solution as it can wear the colon out. o Osmotics (Milk of Magnesia, Fleets phosphosoda, Magnesium citrate, MiraLax, GoLytely) are safer than  o Stimulants (Senokot, Castor Oil, Dulcolax, Ex Lax)    o Do not take laxatives for more than 7days in a row.   IF SEVERELY CONSTIPATED, try a Bowel Retraining Program: o Do not use laxatives.  o Eat a diet high in roughage, such as bran cereals and leafy vegetables.  o Drink six (6) ounces of prune or apricot juice each morning.  o Eat two (2) large servings of stewed fruit each day.  o Take one (1) heaping tablespoon of a psyllium-based bulking agent twice a day. Use sugar-free sweetener when possible to avoid excessive calories.  o Eat a normal breakfast.  o Set aside 15 minutes after breakfast to sit on the toilet, but do not strain to have a bowel movement.  o If you do not have a bowel movement by the third day, use an enema and repeat the above steps.  Controlling diarrhea o Switch to liquids and simpler foods for a few days to avoid stressing your intestines further. o Avoid dairy products (especially milk & ice cream) for a short time.  The intestines often can lose the ability to digest lactose when stressed. o Avoid foods that cause gassiness or bloating.  Typical foods include beans and other  legumes, cabbage, broccoli, and dairy foods.  Every person has some sensitivity to other foods, so listen to our body and avoid those foods that trigger problems for you. o Adding fiber (Citrucel, Metamucil, psyllium, Miralax) gradually can help thicken stools by absorbing excess fluid and retrain the intestines to act more normally.  Slowly increase the dose over a few weeks.  Too much fiber too soon can backfire and cause cramping & bloating. o Probiotics (such as active yogurt, Align, etc) may help repopulate the intestines and colon with normal bacteria and calm down a sensitive digestive tract.  Most studies show it to be of mild help, though, and such products can be costly. o Medicines: - Bismuth subsalicylate (ex. Kayopectate, Pepto Bismol) every 30 minutes for up to 6 doses can help control diarrhea.  Avoid if pregnant. - Loperamide (Immodium) can slow down diarrhea.  Start with two tablets (  total) first and then try one tablet every 6 hours.  Avoid if you are having fevers or severe pain.  If you are not better or start feeling worse, stop all medicines and call your doctor for advice o Call your doctor if you are getting worse or not better.  Sometimes further testing (cultures, endoscopy, X-ray studies, bloodwork, etc) may be needed to help diagnose and treat the cause of the diarrhea.  Recommended surgeon followup physical exam schedule for condyloma/warts: -every 6 months until negative exam x2, then -every Year until negative exam x2, then -as needed thereafter   Genital Warts Genital warts are a sexually transmitted infection. They may appear as small bumps on the tissues of the genital area. CAUSES  Genital warts are caused by a virus called human papillomavirus (HPV). HPV is the most common sexually transmitted disease (STD) and infection of the sex organs. This infection is spread by having unprotected sex with an infected person. It can be spread by vaginal, anal, and oral  sex. Many people do not know they are infected. They may be infected for years without problems. However, even if they do not have problems, they can unknowingly pass the infection to their sexual partners. SYMPTOMS   Itching and irritation in the genital area.Anal wart   Warts that bleed.  Painful sexual intercourse. DIAGNOSIS  Warts are usually recognized with the naked eye on the vagina, vulva, perineum, anus, and rectum. Certain tests can also diagnose genital warts, such as:  A Pap test.  A tissue sample (biopsy) exam.  Colposcopy. A magnifying tool is used to examine the vagina and cervix. The HPV cells will change color when certain solutions are used. TREATMENT  Warts can be removed by:  Applying certain chemicals, such as cantharidin or podophyllin.  Liquid nitrogen freezing (cryotherapy).  Immunotherapy with candida or trichophyton injections.  Laser treatment.  Burning with an electrified probe (electrocautery).  Interferon injections.  Surgery. PREVENTION  HPV vaccination can help prevent HPV infections that cause genital warts and that cause cancer of the cervix. It is recommended that the vaccination be given to people between the ages 45 to 27 years old. The vaccine might not work as well or might not work at  all if you already have HPV. It should not be given to pregnant women. HOME CARE INSTRUCTIONS   It is important to follow your caregiver's instructions. The warts will not go away without treatment. Repeat treatments are often needed to get rid of warts. Even after it appears that the warts are gone, the normal tissue underneath often remains infected.  Do not try to treat genital warts with medicine used to treat hand warts. This type of medicine is strong and can burn the skin in the genital area, causing more damage.  Tell your past and current sexual partner(s) that you have genital warts. They may be infected also and need treatment.  Avoid sexual  contact while being treated.  Do not touch or scratch the warts. The infection may spread to other parts of your body.  Women with genital warts should have a cervical cancer check (Pap test) at least once a year. This type of cancer is slow-growing and can be cured if found early. Chances of developing cervical cancer are increased with HPV.  Inform your obstetrician about your warts in the event of pregnancy. This virus can be passed to the baby's respiratory tract. Discuss this with your caregiver.  Use a condom during sexual intercourse. Following treatment, the use of condoms will help prevent reinfection.  Ask your caregiver about using over-the-counter anti-itch creams. SEEK MEDICAL CARE IF:   Your treated skin becomes red, swollen, or painful.  You have a fever.  You feel generally ill.  You feel little lumps in and around your genital area.  You are bleeding or have painful sexual intercourse. MAKE SURE YOU:   Understand these instructions.  Will watch your condition.  Will get help right away if you are not doing well or get worse.

## 2014-04-26 NOTE — Progress Notes (Signed)
2 Days Post-Op  Subjective: Pain control much better.  Had a BM with no blood.  Objective: Vital signs in last 24 hours: Temp:  [97.5 F (36.4 C)-99.6 F (37.6 C)] 98.5 F (36.9 C) (04/17 0647) Pulse Rate:  [82-84] 82 (04/17 0647) Resp:  [18] 18 (04/17 0647) BP: (125-132)/(74-76) 128/76 mmHg (04/17 0647) SpO2:  [98 %-99 %] 98 % (04/17 0647) Last BM Date: 04/24/14  Intake/Output from previous day: 04/16 0701 - 04/17 0700 In: 250 [IV Piggyback:250] Out: 2250 [Urine:2250] Intake/Output this shift:    PE: General- In NAD Anorectal-dried bloody drainage around anus, open wound is clean  Lab Results:   Recent Labs  04/23/14 2349 04/24/14 1023  WBC 12.1* 12.5*  HGB 7.1* 8.2*  HCT 22.9* 24.5*  PLT 192 125*   BMET No results for input(s): NA, K, CL, CO2, GLUCOSE, BUN, CREATININE, CALCIUM in the last 72 hours. PT/INR No results for input(s): LABPROT, INR in the last 72 hours. Comprehensive Metabolic Panel:    Component Value Date/Time   NA 137 04/23/2014 0715   NA 139 11/24/2013 1611   K 3.7 04/23/2014 0715   K 4.4 11/24/2013 1611   CL 102 04/23/2014 0715   CL 103 11/24/2013 1611   CO2 28 04/23/2014 0715   CO2 27 11/24/2013 1611   BUN 13 04/23/2014 0715   BUN 11 11/24/2013 1611   CREATININE 1.24 04/23/2014 0715   CREATININE 1.09 11/24/2013 1611   CREATININE 1.21 04/16/2013 1057   CREATININE 1.01 09/10/2011 1825   GLUCOSE 87 04/23/2014 0715   GLUCOSE 83 11/24/2013 1611   CALCIUM 8.8 04/23/2014 0715   CALCIUM 9.4 11/24/2013 1611   AST 30 11/24/2013 1611   AST 41* 04/16/2013 1057   ALT 19 11/24/2013 1611   ALT 25 04/16/2013 1057   ALKPHOS 47 11/24/2013 1611   ALKPHOS 42 04/16/2013 1057   BILITOT 0.6 11/24/2013 1611   BILITOT 0.3 04/16/2013 1057   PROT 7.9 11/24/2013 1611   PROT 7.4 04/16/2013 1057   ALBUMIN 4.2 11/24/2013 1611   ALBUMIN 4.3 04/16/2013 1057     Studies/Results: No results found.  Anti-infectives: Anti-infectives    Start      Dose/Rate Route Frequency Ordered Stop   04/24/14 1000  piperacillin-tazobactam (ZOSYN) IVPB 3.375 g     3.375 g 12.5 mL/hr over 240 Minutes Intravenous Every 8 hours 04/24/14 0825     04/23/14 1800  ritonavir (NORVIR) tablet 100 mg  Status:  Discontinued     100 mg Oral Daily 04/23/14 1119 04/23/14 1124   04/23/14 1800  Darunavir Ethanolate (PREZISTA) tablet 800 mg     800 mg Oral Daily with breakfast 04/23/14 1119     04/23/14 1800  emtricitabine-tenofovir (TRUVADA) 200-300 MG per tablet 1 tablet     1 tablet Oral Daily 04/23/14 1119     04/23/14 1800  ritonavir (NORVIR) tablet 100 mg     100 mg Oral Daily with breakfast 04/23/14 1124     04/23/14 1200  Ampicillin-Sulbactam (UNASYN) 3 g in sodium chloride 0.9 % 100 mL IVPB  Status:  Discontinued     3 g 100 mL/hr over 60 Minutes Intravenous Every 6 hours 04/23/14 1119 04/23/14 2151      Assessment Active Problems:   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   LOS: 2 days   Plan: Discharge.  Instructions given to him.  [name] 04/26/2014

## 2014-04-26 NOTE — Progress Notes (Signed)
2 Days Post-Op  Subjective: S  Objective: Vital signs in last 24 hours: Temp:  [97.5 F (36.4 C)-99.6 F (37.6 C)] 98.5 F (36.9 C) (04/17 0647) Pulse Rate:  [82-84] 82 (04/17 0647) Resp:  [18] 18 (04/17 0647) BP: (125-132)/(74-76) 128/76 mmHg (04/17 0647) SpO2:  [98 %-99 %] 98 % (04/17 0647) Last BM Date: 04/24/14  Intake/Output from previous day: 04/16 0701 - 04/17 0700 In: 250 [IV Piggyback:250] Out: 2250 [Urine:2250] Intake/Output this shift:    PE: General- In NAD Anorectal-dried bloody drainage around anus  Lab Results:   Recent Labs  04/23/14 2349 04/24/14 1023  WBC 12.1* 12.5*  HGB 7.1* 8.2*  HCT 22.9* 24.5*  PLT 192 125*   BMET No results for input(s): NA, K, CL, CO2, GLUCOSE, BUN, CREATININE, CALCIUM in the last 72 hours. PT/INR No results for input(s): LABPROT, INR in the last 72 hours. Comprehensive Metabolic Panel:    Component Value Date/Time   NA 137 04/23/2014 0715   NA 139 11/24/2013 1611   K 3.7 04/23/2014 0715   K 4.4 11/24/2013 1611   CL 102 04/23/2014 0715   CL 103 11/24/2013 1611   CO2 28 04/23/2014 0715   CO2 27 11/24/2013 1611   BUN 13 04/23/2014 0715   BUN 11 11/24/2013 1611   CREATININE 1.24 04/23/2014 0715   CREATININE 1.09 11/24/2013 1611   CREATININE 1.21 04/16/2013 1057   CREATININE 1.01 09/10/2011 1825   GLUCOSE 87 04/23/2014 0715   GLUCOSE 83 11/24/2013 1611   CALCIUM 8.8 04/23/2014 0715   CALCIUM 9.4 11/24/2013 1611   AST 30 11/24/2013 1611   AST 41* 04/16/2013 1057   ALT 19 11/24/2013 1611   ALT 25 04/16/2013 1057   ALKPHOS 47 11/24/2013 1611   ALKPHOS 42 04/16/2013 1057   BILITOT 0.6 11/24/2013 1611   BILITOT 0.3 04/16/2013 1057   PROT 7.9 11/24/2013 1611   PROT 7.4 04/16/2013 1057   ALBUMIN 4.2 11/24/2013 1611   ALBUMIN 4.3 04/16/2013 1057     Studies/Results: No results found.  Anti-infectives: Anti-infectives    Start     Dose/Rate Route Frequency Ordered Stop   04/24/14 1000   piperacillin-tazobactam (ZOSYN) IVPB 3.375 g     3.375 g 12.5 mL/hr over 240 Minutes Intravenous Every 8 hours 04/24/14 0825     04/23/14 1800  ritonavir (NORVIR) tablet 100 mg  Status:  Discontinued     100 mg Oral Daily 04/23/14 1119 04/23/14 1124   04/23/14 1800  Darunavir Ethanolate (PREZISTA) tablet 800 mg     800 mg Oral Daily with breakfast 04/23/14 1119     04/23/14 1800  emtricitabine-tenofovir (TRUVADA) 200-300 MG per tablet 1 tablet     1 tablet Oral Daily 04/23/14 1119     04/23/14 1800  ritonavir (NORVIR) tablet 100 mg     100 mg Oral Daily with breakfast 04/23/14 1124     04/23/14 1200  Ampicillin-Sulbactam (UNASYN) 3 g in sodium chloride 0.9 % 100 mL IVPB  Status:  Discontinued     3 g 100 mL/hr over 60 Minutes Intravenous Every 6 hours 04/23/14 1119 04/23/14 2151      Assessment Active Problems:   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   LOS: 2 days   Plan: Try to transition to oral pain meds.  Start sitz bath.   [name] 04/26/2014

## 2014-04-27 ENCOUNTER — Encounter (HOSPITAL_COMMUNITY): Payer: Self-pay | Admitting: Surgery

## 2014-04-27 NOTE — Anesthesia Postprocedure Evaluation (Signed)
  Anesthesia Post-op Note  Patient: [name]  Procedure(s) Performed: Procedure(s): , ligation of bleeding vessels (N/A)  Patient Location: SICU  Anesthesia Type:General  Level of Consciousness: awake and sedated  Airway and Oxygen Therapy: Patient Spontanous Breathing  Post-op Pain: mild  Post-op Assessment: Post-op Vital signs reviewed  Post-op Vital Signs: stable  Last Vitals:  Filed Vitals:   04/26/14 0647  BP: 128/76  Pulse: 82  Temp: 36.9 C  Resp: 18    Complications: No apparent anesthesia complications

## 2014-04-28 LAB — TYPE AND SCREEN
ABO/RH(D): A POS
Antibody Screen: NEGATIVE
UNIT DIVISION: 0
UNIT DIVISION: 0
Unit division: 0
Unit division: 0
Unit division: 0
Unit division: 0

## 2014-04-29 NOTE — Progress Notes (Signed)
Pt called to nurse's station asking for help.  Nurse went to see pt and noted a trail of blood from bed to bathroom; pt standing in bathroom with noted blood in toilet, around toilet and on wall. Pt stated he had trouble getting OOB by himself because he was hooked to everything and he almost did not make it to bathroom.  Pt reminded to called staff with assistance getting OOB. Pt had pushed packing out of rectum.  MD on called, Dr. Donell BeersByerly called at approximately 6:30pm. New orders given to get CBC and obtain a set of vitals and to monitor.  Nurse reported to Diane (7p-7a nurse) of pt's status and told to call Dr. Donell BeersByerly back with results of CBC.

## 2014-05-04 ENCOUNTER — Ambulatory Visit: Payer: Medicare Other | Admitting: Internal Medicine

## 2014-05-05 ENCOUNTER — Other Ambulatory Visit: Payer: Self-pay | Admitting: *Deleted

## 2014-05-05 DIAGNOSIS — B20 Human immunodeficiency virus [HIV] disease: Secondary | ICD-10-CM

## 2014-05-05 MED ORDER — EMTRICITABINE-TENOFOVIR DF 200-300 MG PO TABS: 1.0000 | ORAL_TABLET | Freq: Every day | ORAL | Status: DC

## 2014-05-05 MED ORDER — DARUNAVIR ETHANOLATE 800 MG PO TABS: ORAL_TABLET | ORAL | Status: DC

## 2014-05-05 MED ORDER — RITONAVIR 100 MG PO TABS: 100.0000 mg | ORAL_TABLET | Freq: Every day | ORAL | Status: DC

## 2014-05-07 ENCOUNTER — Other Ambulatory Visit: Payer: Medicare Other

## 2014-05-14 NOTE — Discharge Summary (Signed)
Physician Discharge Summary  Patient ID: [name] MRN: [id number] DOB/AGE: Dec 06, 1960 54 y.o.  Admit date: 04/23/2014 Discharge date: 04/26/2014  Admission Diagnoses:  Anal condyloma  Discharge Diagnoses:    Anal condyloma   Postoperative bleeding   HIV infection  Discharged Condition: good  Hospital Course: he underwent excision of anal condyloma not. He developed postoperative bleeding and early the morning of April 15 was taken back to the operating room for control of that. He had no further bleeding after that. By his second postoperative day his pain was controlled, he had a bowel movement, there was no evidence of bleeding and he was able to be discharged.  Consults: None  Discharge Exam: Blood pressure 128/76, pulse 82, temperature 98.5 F (36.9 C), temperature source Oral, resp. rate 18, height 6' (1.829 m), weight 73.483 kg (162 lb), SpO2 98 %.   Disposition: 01-Home or Self Care     Medication List    STOP taking these medications        NORVIR 100 MG Tabs tablet  Generic drug:  ritonavir     PREZISTA 800 MG tablet  Generic drug:  Darunavir Ethanolate     TRUVADA 200-300 MG per tablet  Generic drug:  emtricitabine-tenofovir      TAKE these medications        amoxicillin-clavulanate 875-125 MG per tablet  Commonly known as:  AUGMENTIN  Take 1 tablet by mouth 2 (two) times daily.     clindamycin 300 MG capsule  Commonly known as:  CLEOCIN  Take 1 capsule (300 mg total) by mouth 3 (three) times daily.     hydrocortisone 25 MG suppository  Commonly known as:  ANUSOL-HC  Place 1 suppository (25 mg total) rectally 2 (two) times daily. For 7 days     ibuprofen 800 MG tablet  Commonly known as:  ADVIL,MOTRIN  Take 1 tablet (800 mg total) by mouth 3 (three) times daily.     lidocaine 5 % ointment  Commonly known as:  XYLOCAINE  Apply 1 application topically as needed.     oxyCODONE 5 MG immediate release tablet  Commonly known as:  Oxy  IR/ROXICODONE  Take 1-2 tablets (5-10 mg total) by mouth every 4 (four) hours as needed for moderate pain, severe pain or breakthrough pain.     oxyCODONE-acetaminophen 5-325 MG per tablet  Commonly known as:  PERCOCET/ROXICET  Take 2 tablets by mouth every 4 (four) hours as needed for moderate pain or severe pain.           Follow-up Information    [facility]. Schedule an appointment as soon as possible for a visit in 10 days.   Specialty:  General Surgery   Why:  For wound re-check, To follow up after your hospital stay, To follow up after your operation   Contact information:   [address] [place] [id number] [phone]       Signed: Adolph PollackOSENBOWER,Neola Worrall J 05/14/2014, 2:44 PM

## 2014-06-16 ENCOUNTER — Emergency Department (HOSPITAL_COMMUNITY): Payer: Medicare Other

## 2014-06-16 ENCOUNTER — Encounter (HOSPITAL_COMMUNITY): Payer: Self-pay | Admitting: Emergency Medicine

## 2014-06-16 ENCOUNTER — Emergency Department (HOSPITAL_COMMUNITY)
Admission: EM | Admit: 2014-06-16 | Discharge: 2014-06-16 | Disposition: A | Discharge status: Home / Self Care | Payer: Medicare Other | Attending: Emergency Medicine | Admitting: Emergency Medicine

## 2014-06-16 DIAGNOSIS — M25561 Pain in right knee: Secondary | ICD-10-CM | POA: Insufficient documentation

## 2014-06-16 DIAGNOSIS — B20 Human immunodeficiency virus [HIV] disease: Secondary | ICD-10-CM | POA: Diagnosis not present

## 2014-06-16 DIAGNOSIS — Z7952 Long term (current) use of systemic steroids: Secondary | ICD-10-CM | POA: Diagnosis not present

## 2014-06-16 DIAGNOSIS — Z792 Long term (current) use of antibiotics: Secondary | ICD-10-CM | POA: Diagnosis not present

## 2014-06-16 DIAGNOSIS — Z791 Long term (current) use of non-steroidal anti-inflammatories (NSAID): Secondary | ICD-10-CM | POA: Insufficient documentation

## 2014-06-16 DIAGNOSIS — Z720 Tobacco use: Secondary | ICD-10-CM | POA: Diagnosis not present

## 2014-06-16 DIAGNOSIS — Z79899 Other long term (current) drug therapy: Secondary | ICD-10-CM | POA: Insufficient documentation

## 2014-06-16 DIAGNOSIS — M25461 Effusion, right knee: Secondary | ICD-10-CM | POA: Insufficient documentation

## 2014-06-16 DIAGNOSIS — Z8719 Personal history of other diseases of the digestive system: Secondary | ICD-10-CM | POA: Insufficient documentation

## 2014-06-16 MED ORDER — OXYCODONE-ACETAMINOPHEN 5-325 MG PO TABS: 1.0000 | ORAL_TABLET | Freq: Four times a day (QID) | ORAL | Status: DC | PRN

## 2014-06-16 MED ORDER — OXYCODONE-ACETAMINOPHEN 5-325 MG PO TABS
1.0000 | ORAL_TABLET | Freq: Once | ORAL | Status: AC
  Administered 2014-06-16: 1 via ORAL
  Filled 2014-06-16: qty 1

## 2014-06-16 NOTE — Discharge Instructions (Signed)
Please follow-up with orthopedic surgeon as attached to the information. Please contact them first seen tomorrow morning and schedule follow-up appointment. If you're unable to be seen by them please [facility] for further evaluation and management. Please monitor for new or worsening signs or symptoms or return immediately if any present. Please use medication only as directed, avoid drinking and driving while taking it.

## 2014-06-16 NOTE — ED Provider Notes (Signed)
CSN: [id number]     Arrival date & time 06/16/14  1650 History  This chart was scribed for Eyvonne Mechanic, PA-C, working with Benjiman Core, MD by Octavia Heir, ED Scribe. This patient was seen in room WTR7/WTR7 and the patient's care was started at 6:16 PM.    Chief Complaint  Patient presents with  . Knee Pain      The history is provided by the patient. No language interpreter was used.  HPI Comments: [name] is a 54 y.o. male with a PMHx of HIV presents to the Emergency Department complaining of constant, gradual worsening right knee pain onset 2 weeks ago. He states the pain is a "throbbing, stabbing" feeling. Patient notes that his knee swells and goes back down throughout the day, he notes this is worse at the end of the day..Pt notes ambulating and standing makes the pain worse. Pt has notes taking many treatments tried to alleviate the pain with no relief. Pt denies any injury to his knee.  Pt reports drinking alcohol 2-3 times a week. Pt is currently on HIV medication. Pt says his last infectious diease appointment was 4 months ago and his count is 735, undetectable. Patient reports that he's been feeling well other than intermittent swelling  denies fever chills, nausea, abdominal pain, vomiting, penile discharge, change in diets, and history of IV drug use. Pt is not on blood thinners. No history of gout.  Past Medical History  Diagnosis Date  . HIV (human immunodeficiency virus infection)   . Hemorrhoids   . 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 Tendon repair      2006-lt index  . Hernia repair  97    lt ing  . Wart fulguration  07/06/2011    Procedure: FULGURATION ANAL WART;  Surgeon: Shelly Rubenstein, MD;  Location: [facility];  Service: General;  Laterality: N/A;  excision anal condyloma  . Wart fulguration N/A 04/23/2014    Procedure: EXCISION OF ANAL CONDYLOMA;  Surgeon: Abigail Miyamoto, MD;  Location:  [facility];  Service: General;  Laterality: N/A;  . Rectal exam under anesthesia N/A 04/24/2014    Procedure:  ligation of bleeding vessels;  Surgeon: Almond Lint, MD;  Location: [facility];  Service: General;  Laterality: N/A;   Family History  Problem Relation Age of Onset  . Cancer Mother     brain  . Cancer Sister     breast   History  Substance Use Topics  . Smoking status: Current Every Day Smoker -- 0.50 packs/day for 25 years    Types: Cigarettes  . Smokeless tobacco: Never Used  . Alcohol Use: 0.0 oz/week    0 Standard drinks or equivalent per week     Comment: occasional beer     Review of Systems  All other systems reviewed and are negative.   Allergies  Shellfish allergy and Sulfa antibiotics  Home Medications   Prior to Admission medications   Medication Sig Start Date End Date Taking? Authorizing Provider  amoxicillin-clavulanate (AUGMENTIN) 875-125 MG per tablet Take 1 tablet by mouth 2 (two) times daily. 04/26/14   Avel Peace, MD  clindamycin (CLEOCIN) 300 MG capsule Take 1 capsule (300 mg total) by mouth 3 (three) times daily. Patient not taking: Reported on 04/22/2014 01/20/14   Nelva Nay, MD  Darunavir Ethanolate (PREZISTA) 800 MG tablet TAKE 1 TABLET BY MOUTH DAILY WITH BREAKFAST. 05/05/14   [name], MD  emtricitabine-tenofovir [facility])  200-300 MG per tablet Take 1 tablet by mouth daily. 05/05/14   Judyann Munsonynthia Snider, MD  hydrocortisone (ANUSOL-HC) 25 MG suppository Place 1 suppository (25 mg total) rectally 2 (two) times daily. For 7 days Patient not taking: Reported on 12/01/2013 11/21/13   Oswaldo ConroyVictoria Creech, PA-C  ibuprofen (ADVIL,MOTRIN) 800 MG tablet Take 1 tablet (800 mg total) by mouth 3 (three) times daily. Patient not taking: Reported on 01/20/2014 11/21/13   Oswaldo ConroyVictoria Creech, PA-C  lidocaine (XYLOCAINE) 5 % ointment Apply 1 application topically as needed. 04/24/14   Abigail Miyamotoouglas Blackman, MD  oxyCODONE (OXY IR/ROXICODONE) 5 MG immediate release tablet  Take 1-2 tablets (5-10 mg total) by mouth every 4 (four) hours as needed for moderate pain, severe pain or breakthrough pain. 04/26/14   Avel Peaceodd Rosenbower, MD  oxyCODONE-acetaminophen (PERCOCET/ROXICET) 5-325 MG per tablet Take 2 tablets by mouth every 4 (four) hours as needed for moderate pain or severe pain. 01/20/14   Nelva Nayobert Beaton, MD  ritonavir (NORVIR) 100 MG TABS tablet Take 1 tablet (100 mg total) by mouth daily. 05/05/14   Judyann Munsonynthia Snider, MD   Triage vitals: BP 146/91 mmHg  Pulse 100  Temp(Src) 98.1 F (36.7 C) (Oral)  Resp 18  SpO2 100% Physical Exam  Constitutional: He appears well-developed and well-nourished. No distress.  HENT:  Head: Normocephalic and atraumatic.  Eyes: Right eye exhibits no discharge. Left eye exhibits no discharge.  Pulmonary/Chest: Effort normal. No respiratory distress.  Musculoskeletal:  Small amount of right knee effusion,  tenderness to palpation along medial lateral joint lines Knee is not warm to touch, no signs of cellulitis or forms of infection Pt has 90's of flexion remainder limited due to pain  No valgus varus laxity, patellar grind negative, Lachman's negative, posterior drawer negative crepitus with range of motion.  Neurological: He is alert. Coordination normal.  Skin: No rash noted. He is not diaphoretic.  Psychiatric: He has a normal mood and affect. His behavior is normal.  Nursing note and vitals reviewed.   ED Course  Procedures  DIAGNOSTIC STUDIES: Oxygen Saturation is 100% on RA, normal by my interpretation.  COORDINATION OF CARE:    Labs Review Labs Reviewed - No data to display  Imaging Review No results found.   EKG Interpretation None      MDM   Final diagnoses:  Right knee pain    Labs: none  Imaging: DG knee showed medial compartment narrowing joint effusion no significant findings   Consults: none indicated   Therapeutics: Percocet   Assessment: joint effusion   Plan: patient presents with right  knee pain and effusion that has been intermittent over the last 2 weeks, this is worse with ambulation and decreased with limited walking or activity. Patient has no systemic symptoms, joint is not red swollen; unlikely septic joint, no history of gout, no change in dietary habits. Knee aspiration was discussed with the patient for further evaluation, he reports significant history of surgery and fever needles and was hesitant for me to do the joint aspiration. Joint effusion at this time was very minimal, I discussed the need for joint aspiration and follow-up evaluation patient reported that he would rather have his knee evaluated by a orthopedic surgeon and aspirated at that time if necessary. I stressed the importance of knee aspiration and need for follow-up evaluation. Patient verbalizes understanding and agreement for today's plan. Him a short course of pain medication, instructions to rest and ice, elevate the knee. He was given contact information and instructed to immediately  contact orthopedics for follow-up evaluation as soon as possible. Patient agreed to follow-up. Patient appeared in no acute distress, was nontoxic, vital signs stable, was ambulating with minimal difficulty at this time.   I personally performed the services described in this documentation, which was scribed in my presence. The recorded information has been reviewed and is accurate.  Eyvonne Mechanic, PA-C 06/17/14 1245  Rolland Porter, MD 06/26/14 [phone]

## 2014-06-16 NOTE — ED Notes (Signed)
Pt c/o rt knee swelling and pain w/o injury x 1 wk.

## 2014-06-25 ENCOUNTER — Other Ambulatory Visit: Payer: Medicare Other

## 2014-08-06 ENCOUNTER — Ambulatory Visit: Payer: Medicare Other | Admitting: Internal Medicine

## 2014-08-11 ENCOUNTER — Other Ambulatory Visit: Payer: Medicare Other

## 2014-09-23 ENCOUNTER — Encounter (HOSPITAL_COMMUNITY): Payer: Self-pay | Admitting: Emergency Medicine

## 2014-09-23 ENCOUNTER — Emergency Department (HOSPITAL_COMMUNITY)
Admission: EM | Admit: 2014-09-23 | Discharge: 2014-09-23 | Disposition: A | Discharge status: Home / Self Care | Payer: Medicare Other | Attending: Emergency Medicine | Admitting: Emergency Medicine

## 2014-09-23 DIAGNOSIS — Z79899 Other long term (current) drug therapy: Secondary | ICD-10-CM | POA: Diagnosis not present

## 2014-09-23 DIAGNOSIS — Z21 Asymptomatic human immunodeficiency virus [HIV] infection status: Secondary | ICD-10-CM | POA: Diagnosis not present

## 2014-09-23 DIAGNOSIS — Z9889 Other specified postprocedural states: Secondary | ICD-10-CM | POA: Diagnosis not present

## 2014-09-23 DIAGNOSIS — K088 Other specified disorders of teeth and supporting structures: Secondary | ICD-10-CM | POA: Diagnosis present

## 2014-09-23 DIAGNOSIS — K047 Periapical abscess without sinus: Secondary | ICD-10-CM | POA: Diagnosis not present

## 2014-09-23 DIAGNOSIS — Z8619 Personal history of other infectious and parasitic diseases: Secondary | ICD-10-CM | POA: Insufficient documentation

## 2014-09-23 DIAGNOSIS — Z8719 Personal history of other diseases of the digestive system: Secondary | ICD-10-CM | POA: Insufficient documentation

## 2014-09-23 DIAGNOSIS — Z720 Tobacco use: Secondary | ICD-10-CM | POA: Insufficient documentation

## 2014-09-23 MED ORDER — TRAMADOL HCL 50 MG PO TABS: 50.0000 mg | ORAL_TABLET | Freq: Four times a day (QID) | ORAL | Status: DC | PRN

## 2014-09-23 MED ORDER — TRAMADOL HCL 50 MG PO TABS
50.0000 mg | ORAL_TABLET | Freq: Once | ORAL | Status: AC
  Administered 2014-09-23: 50 mg via ORAL
  Filled 2014-09-23: qty 1

## 2014-09-23 MED ORDER — AMOXICILLIN-POT CLAVULANATE 875-125 MG PO TABS
1.0000 | ORAL_TABLET | Freq: Once | ORAL | Status: AC
  Administered 2014-09-23: 1 via ORAL
  Filled 2014-09-23: qty 1

## 2014-09-23 MED ORDER — AMOXICILLIN-POT CLAVULANATE 875-125 MG PO TABS: 1.0000 | ORAL_TABLET | Freq: Two times a day (BID) | ORAL | Status: DC

## 2014-09-23 NOTE — ED Provider Notes (Signed)
CSN: [id number]   Arrival date & time 09/23/14 2016  History  This chart was scribed for non-physician practitioner, Earley Favor, NP working with Leta Baptist, MD by Bethel Born, ED Scribe. This patient was seen in room WTR8/WTR8 and the patient's care was started at 9:10 PM.  No chief complaint on file.   HPI The history is provided by the patient. No language interpreter was used.   [name] is a 54 y.o. male with PMHx of HIV who presents to the Emergency Department complaining of constant left-sided dental pain with onset today after having 6 teeth extracted yesterday. He rates the pain 10/10 in severity and Percocet was completed yesterday. Associated symptoms include left facial swelling, left facial pain down into the neck, and left ear pain. He is not on an antibiotic. He has scheduled f/u at the infectious disease clinic in 2 weeks.   Past Medical History  Diagnosis Date  . HIV (human immunodeficiency virus infection)   . Hemorrhoids   . 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 Tendon repair      2006-lt index  . Hernia repair  97    lt ing  . Wart fulguration  07/06/2011    Procedure: FULGURATION ANAL WART;  Surgeon: Shelly Rubenstein, MD;  Location: [facility];  Service: General;  Laterality: N/A;  excision anal condyloma  . Wart fulguration N/A 04/23/2014    Procedure: EXCISION OF ANAL CONDYLOMA;  Surgeon: Abigail Miyamoto, MD;  Location: [facility];  Service: General;  Laterality: N/A;  . Rectal exam under anesthesia N/A 04/24/2014    Procedure:  ligation of bleeding vessels;  Surgeon: Almond Lint, MD;  Location: [facility];  Service: General;  Laterality: N/A;    Family History  Problem Relation Age of Onset  . Cancer Mother     brain  . Cancer Sister     breast    Social History  Substance Use Topics  . Smoking status: Current Every Day Smoker -- 0.50 packs/day for 25 years    Types: Cigarettes  .  Smokeless tobacco: Never Used  . Alcohol Use: 0.0 oz/week    0 Standard drinks or equivalent per week     Comment: occasional beer      Review of Systems  HENT: Positive for dental problem, ear pain and facial swelling. Negative for trouble swallowing. Ear discharge: left.        Left dental pain  Left facial swelling  Neurological: Negative for dizziness and headaches.  All other systems reviewed and are negative.   Home Medications   Prior to Admission medications   Medication Sig Start Date End Date Taking? Authorizing Provider  amoxicillin-clavulanate (AUGMENTIN) 875-125 MG per tablet Take 1 tablet by mouth 2 (two) times daily. 09/23/14   Earley Favor, NP  clindamycin (CLEOCIN) 300 MG capsule Take 1 capsule (300 mg total) by mouth 3 (three) times daily. Patient not taking: Reported on 04/22/2014 01/20/14   Nelva Nay, MD  Darunavir Ethanolate (PREZISTA) 800 MG tablet TAKE 1 TABLET BY MOUTH DAILY WITH BREAKFAST. 05/05/14   [name], MD  emtricitabine-tenofovir (TRUVADA) 200-300 MG per tablet Take 1 tablet by mouth daily. 05/05/14   [name], MD  hydrocortisone (ANUSOL-HC) 25 MG suppository Place 1 suppository (25 mg total) rectally 2 (two) times daily. For 7 days Patient not taking: Reported on 12/01/2013 11/21/13   Oswaldo Conroy, PA-C  ibuprofen (ADVIL,MOTRIN) 800 MG tablet Take 1  tablet (800 mg total) by mouth 3 (three) times daily. Patient not taking: Reported on 01/20/2014 11/21/13   Oswaldo Conroy, PA-C  lidocaine (XYLOCAINE) 5 % ointment Apply 1 application topically as needed. 04/24/14   Abigail Miyamoto, MD  oxyCODONE (OXY IR/ROXICODONE) 5 MG immediate release tablet Take 1-2 tablets (5-10 mg total) by mouth every 4 (four) hours as needed for moderate pain, severe pain or breakthrough pain. 04/26/14   Avel Peace, MD  oxyCODONE-acetaminophen (PERCOCET/ROXICET) 5-325 MG per tablet Take 1 tablet by mouth every 6 (six) hours as needed for severe pain. 06/16/14    Eyvonne Mechanic, PA-C  ritonavir (NORVIR) 100 MG TABS tablet Take 1 tablet (100 mg total) by mouth daily. 05/05/14   [name], MD  traMADol (ULTRAM) 50 MG tablet Take 1 tablet (50 mg total) by mouth every 6 (six) hours as needed. 09/23/14   Earley Favor, NP    Allergies  Shellfish allergy and Sulfa antibiotics  Triage Vitals: BP 150/98 mmHg  Pulse 83  Temp(Src) 97.9 F (36.6 C) (Oral)  Resp 18  SpO2 100%  Physical Exam  Constitutional: He is oriented to person, place, and time. He appears well-developed and well-nourished.  HENT:  Head: Normocephalic and atraumatic.    Right Ear: External ear normal.  Left Ear: External ear normal.  Mouth/Throat:    Sutures visualized.  Mild gum swelling no purulent discharge  Eyes: Pupils are equal, round, and reactive to light.  Neck: Normal range of motion.  Cardiovascular: Normal rate and regular rhythm.   Pulmonary/Chest: Effort normal.  Musculoskeletal: Normal range of motion.  Lymphadenopathy:    He has cervical adenopathy.  Neurological: He is alert and oriented to person, place, and time.  Skin: Skin is warm.  Nursing note and vitals reviewed.   ED Course  Procedures  DIAGNOSTIC STUDIES: Oxygen Saturation is 100% on RA,  normal by my interpretation.    COORDINATION OF CARE: 9:13 PM Discussed treatment plan with pt at bedside and pt agreed to plan.  Labs Review- Labs Reviewed - No data to display  Imaging Review No results found.     Will start patient on Augmentin, Ultram and start him to call his dentist in the morning.  Follow-up with ID clinic as scheduled in 2 weeks   MDM   Final diagnoses:  Dental infection    I personally performed the services described in this documentation, which was scribed in my presence. The recorded information has been reviewed and is accurate.      Earley Favor, NP 09/23/14 1610  Leta Baptist, MD 09/25/14 [phone]

## 2014-09-23 NOTE — Discharge Instructions (Signed)
Dental Care and Dentist Visits Dental care supports good overall health. Regular dental visits can also help you avoid dental pain, bleeding, infection, and other more serious health problems in the future. It is important to keep the mouth healthy because diseases in the teeth, gums, and other oral tissues can spread to other areas of the body. Some problems, such as diabetes, heart disease, and pre-term labor have been associated with poor oral health.  See your dentist every 6 months. If you experience emergency problems such as a toothache or broken tooth, go to the dentist right away. If you see your dentist regularly, you may catch problems early. It is easier to be treated for problems in the early stages.  WHAT TO EXPECT AT A DENTIST VISIT  Your dentist will look for many common oral health problems and recommend proper treatment. At your regular dental visit, you can expect:  Gentle cleaning of the teeth and gums. This includes scraping and polishing. This helps to remove the sticky substance around the teeth and gums (plaque). Plaque forms in the mouth shortly after eating. Over time, plaque hardens on the teeth as tartar. If tartar is not removed regularly, it can cause problems. Cleaning also helps remove stains.  Periodic X-rays. These pictures of the teeth and supporting bone will help your dentist assess the health of your teeth.  Periodic fluoride treatments. Fluoride is a natural mineral shown to help strengthen teeth. Fluoride treatmentinvolves applying a fluoride gel or varnish to the teeth. It is most commonly done in children.  Examination of the mouth, tongue, jaws, teeth, and gums to look for any oral health problems, such as:  Cavities (dental caries). This is decay on the tooth caused by plaque, sugar, and acid in the mouth. It is best to catch a cavity when it is small.  Inflammation of the gums caused by plaque buildup (gingivitis).  Problems with the mouth or malformed  or misaligned teeth.  Oral cancer or other diseases of the soft tissues or jaws. KEEP YOUR TEETH AND GUMS HEALTHY For healthy teeth and gums, follow these general guidelines as well as your dentist's specific advice:  Have your teeth professionally cleaned at the dentist every 6 months.  Brush twice daily with a fluoride toothpaste.  Floss your teeth daily.  Ask your dentist if you need fluoride supplements, treatments, or fluoride toothpaste.  Eat a healthy diet. Reduce foods and drinks with added sugar.  Avoid smoking. TREATMENT FOR ORAL HEALTH PROBLEMS If you have oral health problems, treatment varies depending on the conditions present in your teeth and gums.  Your caregiver will most likely recommend good oral hygiene at each visit.  For cavities, gingivitis, or other oral health disease, your caregiver will perform a procedure to treat the problem. This is typically done at a separate appointment. Sometimes your caregiver will refer you to another dental specialist for specific tooth problems or for surgery. SEEK IMMEDIATE DENTAL CARE IF:  You have pain, bleeding, or soreness in the gum, tooth, jaw, or mouth area.  A permanent tooth becomes loose or separated from the gum socket.  You experience a blow or injury to the mouth or jaw area. Document Released: 09/07/2010 Document Revised: 03/20/2011 Document Reviewed: 09/07/2010 [facility] Patient Information 2015 [place], [place]. This information is not intended to replace advice given to you by your health care provider. Make sure you discuss any questions you have with your health care provider. You have been started on an antibiotic  You  may have the start of an infection and this is more of a prevention,  Call your dentist in the morning

## 2014-09-23 NOTE — ED Notes (Signed)
Pt states had 6 teeth extracted yesterday. Today c/o left sided facial swelling, difficulty swallowing.

## 2014-11-12 ENCOUNTER — Other Ambulatory Visit: Payer: Medicare Other

## 2014-11-13 ENCOUNTER — Other Ambulatory Visit: Payer: Medicare Other

## 2015-02-23 ENCOUNTER — Other Ambulatory Visit: Payer: Medicare Other

## 2015-02-26 ENCOUNTER — Other Ambulatory Visit: Payer: Medicare Other

## 2015-02-26 DIAGNOSIS — B20 Human immunodeficiency virus [HIV] disease: Secondary | ICD-10-CM

## 2015-02-26 DIAGNOSIS — Z113 Encounter for screening for infections with a predominantly sexual mode of transmission: Secondary | ICD-10-CM

## 2015-02-26 DIAGNOSIS — Z79899 Other long term (current) drug therapy: Secondary | ICD-10-CM

## 2015-02-26 LAB — CBC WITH DIFFERENTIAL/PLATELET
Basophils Absolute: 0 10*3/uL (ref 0.0–0.1)
Basophils Relative: 0 % (ref 0–1)
Eosinophils Absolute: 0 10*3/uL (ref 0.0–0.7)
Eosinophils Relative: 0 % (ref 0–5)
HCT: 39.2 % (ref 39.0–52.0)
HEMOGLOBIN: 12.9 g/dL — AB (ref 13.0–17.0)
LYMPHS ABS: 1.6 10*3/uL (ref 0.7–4.0)
LYMPHS PCT: 36 % (ref 12–46)
MCH: 27.7 pg (ref 26.0–34.0)
MCHC: 32.9 g/dL (ref 30.0–36.0)
MCV: 84.1 fL (ref 78.0–100.0)
MPV: 9.9 fL (ref 8.6–12.4)
Monocytes Absolute: 0.5 10*3/uL (ref 0.1–1.0)
Monocytes Relative: 10 % (ref 3–12)
NEUTROS ABS: 2.4 10*3/uL (ref 1.7–7.7)
Neutrophils Relative %: 54 % (ref 43–77)
Platelets: 316 10*3/uL (ref 150–400)
RBC: 4.66 MIL/uL (ref 4.22–5.81)
RDW: 15.3 % (ref 11.5–15.5)
WBC: 4.5 10*3/uL (ref 4.0–10.5)

## 2015-02-26 LAB — COMPREHENSIVE METABOLIC PANEL
ALT: 19 U/L (ref 9–46)
AST: 30 U/L (ref 10–35)
Albumin: 4.4 g/dL (ref 3.6–5.1)
Alkaline Phosphatase: 41 U/L (ref 40–115)
BUN: 12 mg/dL (ref 7–25)
CALCIUM: 9.4 mg/dL (ref 8.6–10.3)
CHLORIDE: 103 mmol/L (ref 98–110)
CO2: 24 mmol/L (ref 20–31)
CREATININE: 1.04 mg/dL (ref 0.70–1.33)
Glucose, Bld: 85 mg/dL (ref 65–99)
Potassium: 4 mmol/L (ref 3.5–5.3)
SODIUM: 136 mmol/L (ref 135–146)
Total Bilirubin: 0.7 mg/dL (ref 0.2–1.2)
Total Protein: 7.3 g/dL (ref 6.1–8.1)

## 2015-02-26 LAB — LIPID PANEL
CHOL/HDL RATIO: 2.1 ratio (ref <=5.0)
Cholesterol: 207 mg/dL — ABNORMAL HIGH (ref 125–200)
HDL: 97 mg/dL (ref >=40)
LDL CALC: 79 mg/dL (ref <130)
Triglycerides: 154 mg/dL — ABNORMAL HIGH (ref <150)
VLDL: 31 mg/dL — ABNORMAL HIGH (ref <30)

## 2015-02-26 LAB — T-HELPER CELL (CD4) - (RCID CLINIC ONLY)
CD4 % Helper T Cell: 19 % — ABNORMAL LOW (ref 33–55)
CD4 T Cell Abs: 310 /uL — ABNORMAL LOW (ref 400–2700)

## 2015-02-27 LAB — RPR

## 2015-03-01 LAB — HIV-1 RNA QUANT-NO REFLEX-BLD: HIV-1 RNA Quant, Log: <1.3 Log copies/mL (ref <1.30)

## 2015-03-09 ENCOUNTER — Ambulatory Visit: Payer: Medicare Other | Admitting: Internal Medicine

## 2015-03-16 ENCOUNTER — Ambulatory Visit (INDEPENDENT_AMBULATORY_CARE_PROVIDER_SITE_OTHER): Payer: Medicare Other | Admitting: Internal Medicine

## 2015-03-16 VITALS — Wt 160.0 lb

## 2015-03-16 DIAGNOSIS — B20 Human immunodeficiency virus [HIV] disease: Secondary | ICD-10-CM | POA: Diagnosis present

## 2015-03-16 MED ORDER — DARUNAVIR-COBICISTAT 800-150 MG PO TABS
1.0000 | ORAL_TABLET | Freq: Every day | ORAL | Status: DC
Start: 2015-03-16 — End: 2016-03-29

## 2015-03-16 MED ORDER — EMTRICITABINE-TENOFOVIR AF 200-25 MG PO TABS: 1.0000 | ORAL_TABLET | Freq: Every day | ORAL | Status: DC

## 2015-03-16 NOTE — Progress Notes (Signed)
Patient ID: [name], male   DOB: 1960-07-27, 55 y.o.   MRN: [id number]       RFV: follow up with HIV disease  Patient ID: [name], male   DOB: 1960-07-27, 55 y.o.   MRN: [id number]  HPI 55yo M with HIV disease, Cd 4 count of 310/LV<20, truvada-DRVr. Doing well with medication. He reports that he has not had difficulty with adherence. In good health. Occasional nasal drainage, but no recent flu like illness.  Outpatient Encounter Prescriptions as of 03/16/2015  Medication Sig  . Darunavir Ethanolate (PREZISTA) 800 MG tablet TAKE 1 TABLET BY MOUTH DAILY WITH BREAKFAST.  Marland Kitchen. emtricitabine-tenofovir (TRUVADA) 200-300 MG per tablet Take 1 tablet by mouth daily.  Marland Kitchen. ibuprofen (ADVIL,MOTRIN) 800 MG tablet Take 1 tablet (800 mg total) by mouth 3 (three) times daily.  . ritonavir (NORVIR) 100 MG TABS tablet Take 1 tablet (100 mg total) by mouth daily.  . traMADol (ULTRAM) 50 MG tablet Take 1 tablet (50 mg total) by mouth every 6 (six) hours as needed.  . hydrocortisone (ANUSOL-HC) 25 MG suppository Place 1 suppository (25 mg total) rectally 2 (two) times daily. For 7 days (Patient not taking: Reported on 12/01/2013)  . [DISCONTINUED] amoxicillin-clavulanate (AUGMENTIN) 875-125 MG per tablet Take 1 tablet by mouth 2 (two) times daily.  . [DISCONTINUED] clindamycin (CLEOCIN) 300 MG capsule Take 1 capsule (300 mg total) by mouth 3 (three) times daily. (Patient not taking: Reported on 04/22/2014)  . [DISCONTINUED] lidocaine (XYLOCAINE) 5 % ointment Apply 1 application topically as needed.  . [DISCONTINUED] oxyCODONE (OXY IR/ROXICODONE) 5 MG immediate release tablet Take 1-2 tablets (5-10 mg total) by mouth every 4 (four) hours as needed for moderate pain, severe pain or breakthrough pain.  . [DISCONTINUED] oxyCODONE-acetaminophen (PERCOCET/ROXICET) 5-325 MG per tablet Take 1 tablet by mouth every 6 (six) hours as needed for severe pain.   No facility-administered encounter medications on file as of  03/16/2015.     Patient Active Problem List   Diagnosis Date Noted  . 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 Human immunodeficiency virus (HIV) disease (HCC) 11/09/2008     Health Maintenance Due  Topic Date Due  . Hepatitis C Screening  01963-07-19  . TETANUS/TDAP  02/07/1980  . COLONOSCOPY  02/07/2011  . INFLUENZA VACCINE  08/10/2014     Review of Systems Review of Systems  Constitutional: Negative for fever, chills, diaphoresis, activity change, appetite change, fatigue and unexpected weight change.  HENT: Negative for congestion, sore throat, rhinorrhea, sneezing, trouble swallowing and sinus pressure.  Eyes: Negative for photophobia and visual disturbance.  Respiratory: Negative for cough, chest tightness, shortness of breath, wheezing and stridor.  Cardiovascular: Negative for chest pain, palpitations and leg swelling.  Gastrointestinal: Negative for nausea, vomiting, abdominal pain, diarrhea, constipation, blood in stool, abdominal distention and anal bleeding.  Genitourinary: Negative for dysuria, hematuria, flank pain and difficulty urinating.  Musculoskeletal: Negative for myalgias, back pain, joint swelling, arthralgias and gait problem.  Skin: Negative for color change, pallor, rash and wound.  Neurological: Negative for dizziness, tremors, weakness and light-headedness.  Hematological: Negative for adenopathy. Does not bruise/bleed easily.  Psychiatric/Behavioral: Negative for behavioral problems, confusion, sleep disturbance, dysphoric mood, decreased concentration and agitation.    Physical Exam   Wt 160 lb (72.576 kg) Physical Exam  Constitutional: He is oriented to person, place, and time. He appears well-developed and well-nourished. No distress.  HENT:  Mouth/Throat: Oropharynx is clear and moist. No oropharyngeal exudate.  Cardiovascular: Normal rate, regular rhythm and normal  heart sounds. Exam reveals no gallop and no friction rub.  No murmur heard.    Pulmonary/Chest: Effort normal and breath sounds normal. No respiratory distress. He has no wheezes.  Abdominal: Soft. Bowel sounds are normal. He exhibits no distension. There is no tenderness.  Lymphadenopathy:  He has no cervical adenopathy.  Neurological: He is alert and oriented to person, place, and time.  Skin: Skin is warm and dry. No rash noted. No erythema.  Psychiatric: He has a normal mood and affect. His behavior is normal.     Lab Results  Component Value Date   CD4TCELL 19* 02/26/2015   Lab Results  Component Value Date   CD4TABS 310* 02/26/2015   CD4TABS 540 11/24/2013   CD4TABS 370* 04/16/2013   Lab Results  Component Value Date   HIV1RNAQUANT <20 02/26/2015   No results found for: HEPBSAB No results found for: RPR  CBC Lab Results  Component Value Date   WBC 4.5 02/26/2015   RBC 4.66 02/26/2015   HGB 12.9* 02/26/2015   HCT 39.2 02/26/2015   PLT 316 02/26/2015   MCV 84.1 02/26/2015   MCH 27.7 02/26/2015   MCHC 32.9 02/26/2015   RDW 15.3 02/26/2015   LYMPHSABS 1.6 02/26/2015   MONOABS 0.5 02/26/2015   EOSABS 0.0 02/26/2015   BASOSABS 0.0 02/26/2015   BMET Lab Results  Component Value Date   NA 136 02/26/2015   K 4.0 02/26/2015   CL 103 02/26/2015   CO2 24 02/26/2015   GLUCOSE 85 02/26/2015   BUN 12 02/26/2015   CREATININE 1.04 02/26/2015   CALCIUM 9.4 02/26/2015   GFRNONAA 65* 04/23/2014   GFRAA 75* 04/23/2014     Assessment and Plan  HIV disease = Will change to descovy prezcobix. rtc in 4-6 wk to testing to see how he is doing  Hx of anal condyloma = at next visit, will do pap and refer to Dr. Ninetta Lights for HRA  Health maintenance = flu vaccine in the Fall

## 2015-04-20 ENCOUNTER — Other Ambulatory Visit: Payer: Medicare Other

## 2015-05-04 ENCOUNTER — Ambulatory Visit: Payer: Medicare Other | Admitting: Internal Medicine

## 2015-07-07 ENCOUNTER — Other Ambulatory Visit: Payer: Self-pay | Admitting: Internal Medicine

## 2015-07-07 DIAGNOSIS — Z21 Asymptomatic human immunodeficiency virus [HIV] infection status: Secondary | ICD-10-CM

## 2015-07-14 ENCOUNTER — Other Ambulatory Visit: Payer: Medicare Other

## 2015-07-15 ENCOUNTER — Other Ambulatory Visit: Payer: Medicare Other

## 2015-07-18 DIAGNOSIS — F319 Bipolar disorder, unspecified: Secondary | ICD-10-CM | POA: Insufficient documentation

## 2015-07-19 DIAGNOSIS — F1994 Other psychoactive substance use, unspecified with psychoactive substance-induced mood disorder: Secondary | ICD-10-CM | POA: Insufficient documentation

## 2015-07-22 ENCOUNTER — Encounter (HOSPITAL_COMMUNITY): Payer: Self-pay | Admitting: Emergency Medicine

## 2015-07-22 ENCOUNTER — Emergency Department (HOSPITAL_COMMUNITY)
Admission: EM | Admit: 2015-07-22 | Discharge: 2015-07-22 | Disposition: A | Discharge status: Home / Self Care | Payer: Medicare Other | Attending: Emergency Medicine | Admitting: Emergency Medicine

## 2015-07-22 DIAGNOSIS — R51 Headache: Secondary | ICD-10-CM | POA: Diagnosis present

## 2015-07-22 DIAGNOSIS — Z5321 Procedure and treatment not carried out due to patient leaving prior to being seen by health care provider: Secondary | ICD-10-CM | POA: Insufficient documentation

## 2015-07-22 NOTE — ED Notes (Signed)
Pt c/o facial pain/headache x 3 days. Denies n/v, denies phonophobia, mild photophobia. Pt also noted to have nasal congestion, pt states this has been ongoing for 3-4 days.

## 2015-07-22 NOTE — ED Notes (Signed)
Called pt 3x with no response. Assumed pt left prior to being seen

## 2015-07-23 DIAGNOSIS — E872 Acidosis, unspecified: Secondary | ICD-10-CM | POA: Insufficient documentation

## 2015-07-23 DIAGNOSIS — F14229 Cocaine dependence with intoxication, unspecified: Secondary | ICD-10-CM | POA: Insufficient documentation

## 2015-07-23 DIAGNOSIS — Z720 Tobacco use: Secondary | ICD-10-CM | POA: Insufficient documentation

## 2015-07-23 DIAGNOSIS — R45851 Suicidal ideations: Secondary | ICD-10-CM | POA: Insufficient documentation

## 2015-07-27 DIAGNOSIS — R45851 Suicidal ideations: Secondary | ICD-10-CM | POA: Insufficient documentation

## 2015-07-27 DIAGNOSIS — F1410 Cocaine abuse, uncomplicated: Secondary | ICD-10-CM | POA: Insufficient documentation

## 2015-08-04 ENCOUNTER — Ambulatory Visit (INDEPENDENT_AMBULATORY_CARE_PROVIDER_SITE_OTHER): Payer: Medicare Other | Admitting: Internal Medicine

## 2015-08-04 ENCOUNTER — Encounter: Payer: Self-pay | Admitting: Internal Medicine

## 2015-08-04 VITALS — BP 137/89 | HR 79 | Temp 97.9°F | Ht 72.0 in | Wt 170.4 lb

## 2015-08-04 DIAGNOSIS — B20 Human immunodeficiency virus [HIV] disease: Secondary | ICD-10-CM

## 2015-08-04 DIAGNOSIS — F329 Major depressive disorder, single episode, unspecified: Secondary | ICD-10-CM | POA: Diagnosis not present

## 2015-08-04 DIAGNOSIS — F32A Depression, unspecified: Secondary | ICD-10-CM

## 2015-08-04 NOTE — Progress Notes (Signed)
Patient ID: [name], male   DOB: 1960-06-18, 55 y.o.   MRN: [id number]      RFV: hiv follow up  Patient ID: [name], male   DOB: 09-01-1960, 55 y.o.   MRN: [id number]  HPI  55yo M with HIV disease, CD 4 count of 310/VL<20 on prezcobix-descovy. Doing well with new regimen. He was recently hospitalized for acute bereavement due to death of childhood friend who has known for 54yr. Unexpected death due to stroke. The patient states that he voluntarily admitted himself to [facility]. His CD 4 count and viral load checked at that time where he remains undectable and CD 4 count now in the 500s. He continues to take wellbutrin for depression. No thoughts to harm himself.  Outpatient Encounter Prescriptions as of 08/04/2015  Medication Sig  . buPROPion (WELLBUTRIN XL) 300 MG 24 hr tablet Take 300 mg by mouth daily.  . darunavir-cobicistat (PREZCOBIX) 800-150 MG tablet Take 1 tablet by mouth daily. Swallow whole. Do NOT crush, break or chew tablets. Take with food.  Marland Kitchen emtricitabine-tenofovir AF (DESCOVY) 200-25 MG tablet Take 1 tablet by mouth daily.  Marland Kitchen ibuprofen (ADVIL,MOTRIN) 800 MG tablet Take 800 mg by mouth every 8 (eight) hours as needed for headache, mild pain or moderate pain.   . hydrocortisone (ANUSOL-HC) 25 MG suppository Place 1 suppository (25 mg total) rectally 2 (two) times daily. For 7 days (Patient not taking: Reported on 12/01/2013)  . hydrOXYzine (VISTARIL) 50 MG capsule Take 50 mg by mouth 2 (two) times daily as needed for anxiety or itching.    No facility-administered encounter medications on file as of 08/04/2015.      Patient Active Problem List   Diagnosis Date Noted  . 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 Human immunodeficiency virus (HIV) disease (HCC) 11/09/2008     Health Maintenance Due  Topic Date Due  . Hepatitis C Screening  February 17, 1960  . TETANUS/TDAP  02/07/1980  . COLONOSCOPY  02/07/2011     Review of Systems +depression otherwise 10 point ROS is  negative Physical Exam   BP 137/89 (BP Location: Right Arm, Patient Position: Sitting, Cuff Size: Normal)   Pulse 79   Temp 97.9 F (36.6 C) (Oral)   Ht 6' (1.829 m)   Wt 170 lb 6.4 oz (77.3 kg)   SpO2 98%   BMI 23.11 kg/m  Physical Exam  Constitutional: He is oriented to person, place, and time. He appears well-developed and well-nourished. No distress.  HENT:  Mouth/Throat: Oropharynx is clear and moist. No oropharyngeal exudate.  Cardiovascular: Normal rate, regular rhythm and normal heart sounds. Exam reveals no gallop and no friction rub.  No murmur heard.  Pulmonary/Chest: Effort normal and breath sounds normal. No respiratory distress. He has no wheezes.  Abdominal: Soft. Bowel sounds are normal. He exhibits no distension. There is no tenderness.  Lymphadenopathy:  He has no cervical adenopathy.  Neurological: He is alert and oriented to person, place, and time.  Skin: Skin is warm and dry. No rash noted. No erythema.  Psychiatric: He has a normal mood and affect. His behavior is normal.    Lab Results  Component Value Date   CD4TCELL 19 (L) 02/26/2015   Lab Results  Component Value Date   CD4TABS 310 (L) 02/26/2015   CD4TABS 540 11/24/2013   CD4TABS 370 (L) 04/16/2013   Lab Results  Component Value Date   HIV1RNAQUANT <20 02/26/2015   No results found for: HEPBSAB No results found for: RPR  CBC Lab  Results  Component Value Date   WBC 4.5 02/26/2015   RBC 4.66 02/26/2015   HGB 12.9 (L) 02/26/2015   HCT 39.2 02/26/2015   PLT 316 02/26/2015   MCV 84.1 02/26/2015   MCH 27.7 02/26/2015   MCHC 32.9 02/26/2015   RDW 15.3 02/26/2015   LYMPHSABS 1.6 02/26/2015   MONOABS 0.5 02/26/2015   EOSABS 0.0 02/26/2015   BASOSABS 0.0 02/26/2015   BMET Lab Results  Component Value Date   NA 136 02/26/2015   K 4.0 02/26/2015   CL 103 02/26/2015   CO2 24 02/26/2015   GLUCOSE 85 02/26/2015   BUN 12 02/26/2015   CREATININE 1.04 02/26/2015   CALCIUM 9.4 02/26/2015    GFRNONAA 65 (L) 04/23/2014   GFRAA 75 (L) 04/23/2014     Assessment and Plan  HIV disease =well controlled, though had drop in CD 4 count which may be due to acute illness at time of testing. Continue with current regimen. Will repeat in 3 months  Depression = offered for him to do counseling at clinic to help with management of symptoms. Does not have any plans for harm to self. He states that [facility] will [facility] with [facility]  Health maintenance = flu vaccine in the fall

## 2015-08-05 ENCOUNTER — Telehealth: Payer: Self-pay

## 2015-08-05 NOTE — Telephone Encounter (Signed)
Patient declined Dr. Feliz Beam order to be scheduled for anuscopy clinic at this time due to going on a vacation the whole month of August. Explained the importance of having one due to his history of having a condyloma. Patient stated that he had a bad experience with his condyloma removal surgery and "needed to have blood because he bled out so bad."  Patient stated he will think about it and will discuss it again at next visit. Rejeana Brock, LPN

## 2015-11-10 ENCOUNTER — Ambulatory Visit: Payer: Medicare Other | Admitting: Internal Medicine

## 2016-03-20 ENCOUNTER — Ambulatory Visit: Payer: Medicare Other | Admitting: Internal Medicine

## 2016-03-29 ENCOUNTER — Other Ambulatory Visit: Payer: Self-pay | Admitting: *Deleted

## 2016-03-29 ENCOUNTER — Ambulatory Visit: Payer: Medicare Other | Admitting: Internal Medicine

## 2016-03-29 DIAGNOSIS — B20 Human immunodeficiency virus [HIV] disease: Secondary | ICD-10-CM

## 2016-03-29 MED ORDER — EMTRICITABINE-TENOFOVIR AF 200-25 MG PO TABS: 1.0000 | ORAL_TABLET | Freq: Every day | ORAL | 0 refills | Status: DC

## 2016-03-29 MED ORDER — DARUNAVIR-COBICISTAT 800-150 MG PO TABS: 1.0000 | ORAL_TABLET | Freq: Every day | ORAL | 0 refills | Status: DC

## 2016-03-30 ENCOUNTER — Other Ambulatory Visit: Payer: Medicare Other

## 2016-06-16 ENCOUNTER — Other Ambulatory Visit: Payer: Medicare Other

## 2016-06-16 ENCOUNTER — Other Ambulatory Visit: Payer: Self-pay | Admitting: *Deleted

## 2016-06-16 DIAGNOSIS — Z21 Asymptomatic human immunodeficiency virus [HIV] infection status: Secondary | ICD-10-CM

## 2016-06-16 DIAGNOSIS — B20 Human immunodeficiency virus [HIV] disease: Secondary | ICD-10-CM

## 2016-06-16 LAB — COMPLETE METABOLIC PANEL WITH GFR
ALK PHOS: 40 U/L (ref 40–115)
ALT: 21 U/L (ref 9–46)
AST: 26 U/L (ref 10–35)
Albumin: 4.4 g/dL (ref 3.6–5.1)
BUN: 13 mg/dL (ref 7–25)
CALCIUM: 9.5 mg/dL (ref 8.6–10.3)
CO2: 28 mmol/L (ref 20–31)
Chloride: 100 mmol/L (ref 98–110)
Creat: 1.11 mg/dL (ref 0.70–1.33)
GFR, EST AFRICAN AMERICAN: 86 mL/min (ref >=60)
GFR, EST NON AFRICAN AMERICAN: 74 mL/min (ref >=60)
Glucose, Bld: 87 mg/dL (ref 65–99)
POTASSIUM: 4 mmol/L (ref 3.5–5.3)
Sodium: 135 mmol/L (ref 135–146)
Total Bilirubin: 0.4 mg/dL (ref 0.2–1.2)
Total Protein: 7.3 g/dL (ref 6.1–8.1)

## 2016-06-16 LAB — CBC WITH DIFFERENTIAL/PLATELET
BASOS ABS: 0 {cells}/uL (ref 0–200)
Basophils Relative: 0 %
EOS PCT: 1 %
Eosinophils Absolute: 56 cells/uL (ref 15–500)
HEMATOCRIT: 39.5 % (ref 38.5–50.0)
Hemoglobin: 13.2 g/dL (ref 13.2–17.1)
LYMPHS ABS: 2072 {cells}/uL (ref 850–3900)
Lymphocytes Relative: 37 %
MCH: 27.6 pg (ref 27.0–33.0)
MCHC: 33.4 g/dL (ref 32.0–36.0)
MCV: 82.6 fL (ref 80.0–100.0)
MONO ABS: 504 {cells}/uL (ref 200–950)
MPV: 9.9 fL (ref 7.5–12.5)
Monocytes Relative: 9 %
NEUTROS PCT: 53 %
Neutro Abs: 2968 cells/uL (ref 1500–7800)
Platelets: 349 10*3/uL (ref 140–400)
RBC: 4.78 MIL/uL (ref 4.20–5.80)
RDW: 15.3 % — ABNORMAL HIGH (ref 11.0–15.0)
WBC: 5.6 10*3/uL (ref 3.8–10.8)

## 2016-06-16 LAB — T-HELPER CELL (CD4) - (RCID CLINIC ONLY)
CD4 T CELL HELPER: 18 % — AB (ref 33–55)
CD4 T Cell Abs: 370 /uL — ABNORMAL LOW (ref 400–2700)

## 2016-06-16 MED ORDER — EMTRICITABINE-TENOFOVIR AF 200-25 MG PO TABS: 1.0000 | ORAL_TABLET | Freq: Every day | ORAL | 0 refills | Status: DC

## 2016-06-16 MED ORDER — DARUNAVIR-COBICISTAT 800-150 MG PO TABS: 1.0000 | ORAL_TABLET | Freq: Every day | ORAL | 0 refills | Status: DC

## 2016-06-20 LAB — HIV-1 RNA QUANT-NO REFLEX-BLD
HIV 1 RNA QUANT: 26 {copies}/mL — AB
HIV-1 RNA Quant, Log: 1.41 Log copies/mL — ABNORMAL HIGH

## 2016-07-19 ENCOUNTER — Ambulatory Visit: Payer: Medicare Other | Admitting: Internal Medicine

## 2016-09-18 ENCOUNTER — Inpatient Hospital Stay (HOSPITAL_COMMUNITY)
Admission: AD | Admit: 2016-09-18 | Discharge: 2016-09-21 | DRG: 885 | Disposition: A | Discharge status: Home / Self Care | Payer: Medicare Other | Source: Intra-hospital | Attending: Psychiatry | Admitting: Psychiatry

## 2016-09-18 ENCOUNTER — Emergency Department (HOSPITAL_COMMUNITY)
Admission: EM | Admit: 2016-09-18 | Discharge: 2016-09-18 | Disposition: A | Discharge status: Other Acute Inpatient Hospital | Payer: Medicare Other | Attending: Emergency Medicine | Admitting: Emergency Medicine

## 2016-09-18 ENCOUNTER — Encounter (HOSPITAL_COMMUNITY): Payer: Self-pay | Admitting: *Deleted

## 2016-09-18 DIAGNOSIS — F1010 Alcohol abuse, uncomplicated: Secondary | ICD-10-CM | POA: Insufficient documentation

## 2016-09-18 DIAGNOSIS — B20 Human immunodeficiency virus [HIV] disease: Secondary | ICD-10-CM | POA: Diagnosis present

## 2016-09-18 DIAGNOSIS — Y906 Blood alcohol level of 120-199 mg/100 ml: Secondary | ICD-10-CM | POA: Diagnosis not present

## 2016-09-18 DIAGNOSIS — F10239 Alcohol dependence with withdrawal, unspecified: Secondary | ICD-10-CM | POA: Diagnosis present

## 2016-09-18 DIAGNOSIS — F322 Major depressive disorder, single episode, severe without psychotic features: Secondary | ICD-10-CM | POA: Diagnosis not present

## 2016-09-18 DIAGNOSIS — F10129 Alcohol abuse with intoxication, unspecified: Secondary | ICD-10-CM | POA: Diagnosis present

## 2016-09-18 DIAGNOSIS — Z23 Encounter for immunization: Secondary | ICD-10-CM | POA: Diagnosis present

## 2016-09-18 DIAGNOSIS — F419 Anxiety disorder, unspecified: Secondary | ICD-10-CM | POA: Diagnosis present

## 2016-09-18 DIAGNOSIS — G4700 Insomnia, unspecified: Secondary | ICD-10-CM | POA: Diagnosis present

## 2016-09-18 DIAGNOSIS — F1910 Other psychoactive substance abuse, uncomplicated: Secondary | ICD-10-CM | POA: Insufficient documentation

## 2016-09-18 DIAGNOSIS — F17210 Nicotine dependence, cigarettes, uncomplicated: Secondary | ICD-10-CM | POA: Diagnosis present

## 2016-09-18 DIAGNOSIS — F1990 Other psychoactive substance use, unspecified, uncomplicated: Secondary | ICD-10-CM

## 2016-09-18 DIAGNOSIS — R45851 Suicidal ideations: Secondary | ICD-10-CM | POA: Diagnosis present

## 2016-09-18 DIAGNOSIS — F332 Major depressive disorder, recurrent severe without psychotic features: Secondary | ICD-10-CM | POA: Diagnosis present

## 2016-09-18 DIAGNOSIS — Z79899 Other long term (current) drug therapy: Secondary | ICD-10-CM | POA: Insufficient documentation

## 2016-09-18 DIAGNOSIS — F1490 Cocaine use, unspecified, uncomplicated: Secondary | ICD-10-CM | POA: Diagnosis present

## 2016-09-18 DIAGNOSIS — F10229 Alcohol dependence with intoxication, unspecified: Secondary | ICD-10-CM | POA: Diagnosis not present

## 2016-09-18 DIAGNOSIS — F329 Major depressive disorder, single episode, unspecified: Secondary | ICD-10-CM | POA: Diagnosis present

## 2016-09-18 LAB — RAPID URINE DRUG SCREEN, HOSP PERFORMED
Amphetamines: NOT DETECTED
BARBITURATES: NOT DETECTED
Benzodiazepines: NOT DETECTED
Cocaine: POSITIVE — AB
Opiates: NOT DETECTED
Tetrahydrocannabinol: NOT DETECTED

## 2016-09-18 LAB — COMPREHENSIVE METABOLIC PANEL
ALT: 26 U/L (ref 17–63)
AST: 36 U/L (ref 15–41)
Albumin: 4.7 g/dL (ref 3.5–5.0)
Alkaline Phosphatase: 38 U/L (ref 38–126)
Anion gap: 10 (ref 5–15)
BILIRUBIN TOTAL: 0.7 mg/dL (ref 0.3–1.2)
BUN: 11 mg/dL (ref 6–20)
CHLORIDE: 104 mmol/L (ref 101–111)
CO2: 22 mmol/L (ref 22–32)
CREATININE: 1.01 mg/dL (ref 0.61–1.24)
Calcium: 9.2 mg/dL (ref 8.9–10.3)
GFR calc Af Amer: >60 mL/min (ref >60)
GLUCOSE: 82 mg/dL (ref 65–99)
POTASSIUM: 4.4 mmol/L (ref 3.5–5.1)
Sodium: 136 mmol/L (ref 135–145)
TOTAL PROTEIN: 8 g/dL (ref 6.5–8.1)

## 2016-09-18 LAB — CBC
HEMATOCRIT: 40.5 % (ref 39.0–52.0)
HEMOGLOBIN: 13.3 g/dL (ref 13.0–17.0)
MCH: 27.5 pg (ref 26.0–34.0)
MCHC: 32.8 g/dL (ref 30.0–36.0)
MCV: 83.9 fL (ref 78.0–100.0)
Platelets: 285 10*3/uL (ref 150–400)
RBC: 4.83 MIL/uL (ref 4.22–5.81)
RDW: 14.4 % (ref 11.5–15.5)
WBC: 5.9 10*3/uL (ref 4.0–10.5)

## 2016-09-18 LAB — SALICYLATE LEVEL

## 2016-09-18 LAB — ETHANOL: ALCOHOL ETHYL (B): 196 mg/dL — AB (ref <5)

## 2016-09-18 LAB — ACETAMINOPHEN LEVEL: Acetaminophen (Tylenol), Serum: <10 ug/mL — ABNORMAL LOW (ref 10–30)

## 2016-09-18 MED ORDER — VITAMIN B-1 100 MG PO TABS
100.0000 mg | ORAL_TABLET | Freq: Every day | ORAL | Status: DC
  Administered 2016-09-18: 100 mg via ORAL
  Filled 2016-09-18: qty 1

## 2016-09-18 MED ORDER — ALUM & MAG HYDROXIDE-SIMETH 200-200-20 MG/5ML PO SUSP: 30.0000 mL | ORAL | Status: DC | PRN

## 2016-09-18 MED ORDER — LORAZEPAM 2 MG/ML IJ SOLN: 0.0000 mg | Freq: Four times a day (QID) | INTRAMUSCULAR | Status: DC

## 2016-09-18 MED ORDER — HYDROXYZINE HCL 25 MG PO TABS
25.0000 mg | ORAL_TABLET | Freq: Three times a day (TID) | ORAL | Status: DC | PRN
Start: 2016-09-18 — End: 2016-09-19

## 2016-09-18 MED ORDER — ACETAMINOPHEN 325 MG PO TABS: 650.0000 mg | ORAL_TABLET | Freq: Four times a day (QID) | ORAL | Status: DC | PRN

## 2016-09-18 MED ORDER — LORAZEPAM 1 MG PO TABS: 0.0000 mg | ORAL_TABLET | Freq: Four times a day (QID) | ORAL | Status: DC

## 2016-09-18 MED ORDER — LORAZEPAM 2 MG/ML IJ SOLN: 0.0000 mg | Freq: Two times a day (BID) | INTRAMUSCULAR | Status: DC

## 2016-09-18 MED ORDER — LORAZEPAM 1 MG PO TABS: 0.0000 mg | ORAL_TABLET | Freq: Two times a day (BID) | ORAL | Status: DC

## 2016-09-18 MED ORDER — THIAMINE HCL 100 MG/ML IJ SOLN: 100.0000 mg | Freq: Every day | INTRAMUSCULAR | Status: DC

## 2016-09-18 MED ORDER — MAGNESIUM HYDROXIDE 400 MG/5ML PO SUSP: 30.0000 mL | Freq: Every day | ORAL | Status: DC | PRN

## 2016-09-18 MED ORDER — TRAZODONE HCL 50 MG PO TABS: 50.0000 mg | ORAL_TABLET | Freq: Every evening | ORAL | Status: DC | PRN

## 2016-09-18 NOTE — ED Notes (Signed)
Regular Diet has been ordered. 

## 2016-09-18 NOTE — ED Notes (Signed)
Per Ala DachFord, TTS pt [facility]. Pt will be going to new floor that is due to open later today

## 2016-09-18 NOTE — Progress Notes (Signed)
Per Malva LimesLinsey Strader , [facility], [facility] ; Accepting provider is Ferne ReusJustina Okonkwo, NP; Attending provider is Dr. Jama Flavorsobos.  Malva LimesLinsey Strader, [facility] will notify the patient's nurse when the bed is ready.   Number for report is [phone].   Asher MuirJamie, RN notified.   Baldo DaubJolan Birgit Nowling MSW, LCSWA CSW Disposition [phone]

## 2016-09-18 NOTE — ED Notes (Signed)
Patient was Given A Sprite and A Regular Diet was Ordered for [name] LeeDinner.

## 2016-09-18 NOTE — ED Notes (Signed)
Patient was given a coke and cookies for snack, and A Lunch order was taken.

## 2016-09-18 NOTE — ED Provider Notes (Signed)
[facility]-EMERGENCY DEPT Provider Note   CSN: [id number] Arrival date & time: 09/18/16  0105     History   Chief Complaint Chief Complaint  Patient presents with  . Psychiatric Evaluation    HPI [name] is a 56 y.o. male.  Patient presents to the ED with a chief complaint of SI.  He states that he recently lost 2 of his immediate family members in a car accident a couple of months ago.  He states that he has been coping with alcohol and drugs.  He reports that he would like to commit suicide, but without a clear plan.  He states that he drinks about 18 beers per day and supplements with street drugs.  He denies any physical complaints.  There are no other associated symptoms.   The history is provided by the patient. No language interpreter was used.    Past Medical History:  Diagnosis Date  . 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 Hemorrhoids   . HIV (human immunodeficiency virus infection) [facility](HCC)     Patient Active Problem List   Diagnosis Date Noted  . 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 Human immunodeficiency virus (HIV) disease (HCC) 11/09/2008    Past Surgical History:  Procedure Laterality Date  . HERNIA REPAIR  97   lt ing  . RECTAL EXAM UNDER ANESTHESIA N/A 04/24/2014   Procedure:  ligation of bleeding vessels;  Surgeon: Almond LintFaera Byerly, MD;  Location: [facility];  Service: General;  Laterality: N/A;  . TENDON REPAIR     2006-lt index  . WART FULGURATION  07/06/2011   Procedure: FULGURATION ANAL WART;  Surgeon: Shelly Rubensteinouglas A Blackman, MD;  Location: [facility];  Service: General;  Laterality: N/A;  excision anal condyloma  . WART FULGURATION N/A 04/23/2014   Procedure: EXCISION OF ANAL CONDYLOMA;  Surgeon: Abigail Miyamotoouglas Blackman, MD;  Location: [facility];  Service: General;  Laterality: N/A;       Home Medications    Prior to Admission medications   Medication Sig Start Date End Date Taking? Authorizing Provider  buPROPion (WELLBUTRIN XL) 300 MG 24 hr tablet Take 300 mg by  mouth daily. 07/20/15   [provider]  darunavir-cobicistat (PREZCOBIX) 800-150 MG tablet Take 1 tablet by mouth daily. Swallow whole. Do NOT crush, break or chew tablets. Take with food. 06/16/16   Judyann MunsonSnider, Cynthia, MD  emtricitabine-tenofovir AF (DESCOVY) 200-25 MG tablet Take 1 tablet by mouth daily. 06/16/16   Judyann MunsonSnider, Cynthia, MD  hydrocortisone (ANUSOL-HC) 25 MG suppository Place 1 suppository (25 mg total) rectally 2 (two) times daily. For 7 days Patient not taking: Reported on 12/01/2013 11/21/13   Oswaldo Conroyreech, Victoria, PA-C  hydrOXYzine (VISTARIL) 50 MG capsule Take 50 mg by mouth 2 (two) times daily as needed for anxiety or itching.  06/28/15   [provider]  ibuprofen (ADVIL,MOTRIN) 800 MG tablet Take 800 mg by mouth every 8 (eight) hours as needed for headache, mild pain or moderate pain.     [provider]    Family History Family History  Problem Relation Age of Onset  . Cancer Mother        brain  . Cancer Sister        breast    Social History Social History  Substance Use Topics  . Smoking status: Current Every Day Smoker    Packs/day: 0.50    Years: 25.00    Types: Cigarettes  . Smokeless tobacco: Never Used  . Alcohol use 0.0 oz/week  Comment: occasional beer      Allergies   Shellfish allergy and Sulfa antibiotics   Review of Systems Review of Systems  All other systems reviewed and are negative.    Physical Exam Updated Vital Signs BP 137/89 (BP Location: Right Arm)   Pulse 85   Temp 97.9 F (36.6 C) (Oral)   Resp 16   Ht 6' (1.829 m)   Wt 78 kg (172 lb)   SpO2 98%   BMI 23.33 kg/m   Physical Exam  Constitutional: He is oriented to person, place, and time. He appears well-developed and well-nourished.  HENT:  Head: Normocephalic and atraumatic.  Eyes: Pupils are equal, round, and reactive to light. Conjunctivae and EOM are normal. Right eye exhibits no discharge. Left eye exhibits no discharge. No scleral icterus.    Neck: Normal range of motion. Neck supple. No JVD present.  Cardiovascular: Normal rate, regular rhythm and normal heart sounds.  Exam reveals no gallop and no friction rub.   No murmur heard. Pulmonary/Chest: Effort normal and breath sounds normal. No respiratory distress. He has no wheezes. He has no rales. He exhibits no tenderness.  Abdominal: Soft. He exhibits no distension and no mass. There is no tenderness. There is no rebound and no guarding.  Musculoskeletal: Normal range of motion. He exhibits no edema or tenderness.  Neurological: He is alert and oriented to person, place, and time.  Skin: Skin is warm and dry.  Psychiatric: He has a normal mood and affect. His behavior is normal. Judgment and thought content normal.  Nursing note and vitals reviewed.    ED Treatments / Results  Labs (all labs ordered are listed, but only abnormal results are displayed) Labs Reviewed  CBC  COMPREHENSIVE METABOLIC PANEL  ETHANOL  RAPID URINE DRUG SCREEN, HOSP PERFORMED    EKG  EKG Interpretation None       Radiology No results found.  Procedures Procedures (including critical care time)  Medications Ordered in ED Medications - No data to display   Initial Impression / Assessment and Plan / ED Course  I have reviewed the triage vital signs and the nursing notes.  Pertinent labs & imaging results that were available during my care of the patient were reviewed by me and considered in my medical decision making (see chart for details).    Medically clear for psychiatric evaluation of SI.  Patient accepted at [facility].  Awaiting bed.  Final Clinical Impressions(s) / ED Diagnoses   Final diagnoses:  Suicidal ideation  Alcohol abuse  Polysubstance abuse    New Prescriptions New Prescriptions   No medications on file     Roxy Horseman, Cordelia Poche 09/18/16 [id number]    Gilda Crease, MD 09/18/16 [phone]

## 2016-09-18 NOTE — ED Notes (Signed)
The pt is c/o drinking alcohol 30 a day  He last drank yesterday.  He smoked cocaine 2 days ago

## 2016-09-18 NOTE — ED Notes (Addendum)
[facility] reports that [facility] have a bed today at [facility] for time once bed is available.

## 2016-09-18 NOTE — ED Notes (Signed)
Patient ate 100% of his meal. 

## 2016-09-18 NOTE — BH Assessment (Signed)
Tele Assessment Note   Patient Name: [name] MRN: [id number] Referring Physician: Roxy Horseman, PA-C Location of Patient:  [facility] Location of Provider: Behavioral Health TTS Department  [name] is an 56 y.o. single male who presents unaccompanied to [facility] reporting symptoms of depression, alcohol abuse and suicidal ideation. Pt says his mother and sister were killed one month ago in a motor vehicle accident. Pt says he has had a problem with alcohol for years but since their deaths he has been drinking more heavily. Pt reports symptoms including crying spells, social withdrawal, loss of interest in usual pleasures, irritability, decreased concentration, decreased sleep, decreased appetite and feelings of guilt and hopelessness. He reports suicidal ideation with no plan. He identifies his remaining family as a deterrent to acting on suicidal thoughts. Pt denies any history of suicide attempts. Pt denies any history of intentional self-injurious behavior. Pt denies current homicidal ideation or history of violence. Pt denies any history of psychotic symptoms.  Pt reports he is drinking approximately 18 cans of beer daily. He says his last drink was a few hours ago. Pt's blood alcohol level is 196. Pt's urine drug screen is positive for cocaine. Pt says he doesn't normally use cocaine but someone gave him some recently and he took it because he was depressed. Pt states he experiences withdrawal symptoms when he stops drinking including tremors, sweats, nausea, vomiting, diarrhea and blackouts. Pt denies history of seizure. Pt states the longest he has gone without drinking is two weeks.  Pt identifies the deaths of his mother and sister as his primary stressor. He also says he was diagnosed with HIV in 2002 and that dealing with the disease is stressful. Pt reports he lives alone and is on disability. He identifies his younger sister and brother as his primary supports. Pt  denies legal problems. Pt denies history of abuse. Pt states he received inpatient detox services years ago. He denies any history of inpatient or outpatient mental health treatment.  Pt is dressed in hospital scrubs, alert, oriented x4 with normal speech and normal motor behavior. Eye contact is good. Pt's mood is depressed and affect is congruent with mood. Thought process is coherent and relevant. There is no indication Pt is currently responding to internal stimuli or experiencing delusional thought content. Pt was cooperative throughout assessment. He says he is willing to sign voluntarily into a psychiatric facility.   Diagnosis: Major Depressive Disorder, Single Episode, Severe Without Psychotic Features; Alcohol Use Disorder, Severe  Past Medical History:  Past Medical History:  Diagnosis Date  . 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 Hemorrhoids   . HIV (human immunodeficiency virus infection) (HCC)     Past Surgical History:  Procedure Laterality Date  . HERNIA REPAIR  97   lt ing  . RECTAL EXAM UNDER ANESTHESIA N/A 04/24/2014   Procedure:  ligation of bleeding vessels;  Surgeon: Almond Lint, MD;  Location: [facility];  Service: General;  Laterality: N/A;  . TENDON REPAIR     2006-lt index  . WART FULGURATION  07/06/2011   Procedure: FULGURATION ANAL WART;  Surgeon: Shelly Rubenstein, MD;  Location: [facility];  Service: General;  Laterality: N/A;  excision anal condyloma  . WART FULGURATION N/A 04/23/2014   Procedure: EXCISION OF ANAL CONDYLOMA;  Surgeon: Abigail Miyamoto, MD;  Location: [facility];  Service: General;  Laterality: N/A;    Family History:  Family History  Problem Relation Age of Onset  . Cancer Mother  brain  . Cancer Sister        breast    Social History:  reports that he has been smoking Cigarettes.  He has a 12.50 pack-year smoking history. He has never used smokeless tobacco. He reports that he drinks alcohol. He reports that he does not use  drugs.  Additional Social History:  Alcohol / Drug Use Pain Medications: See MAR Prescriptions: See MAR Over the Counter: See MAR History of alcohol / drug use?: Yes (Pt reports he used cocaine one time recently. Denies regular use.) Longest period of sobriety (when/how long): Two weeks Negative Consequences of Use: Financial, Personal relationships Withdrawal Symptoms: Tremors, Sweats, Nausea / Vomiting, Diarrhea, Blackouts Substance #1 Name of Substance 1: Alcohol 1 - Age of First Use: 27 1 - Amount (size/oz): 18 cans of beer 1 - Frequency: daily 1 - Duration: Ongoing for years 1 - Last Use / Amount: 09/17/16  CIWA: CIWA-Ar BP: 137/89 Pulse Rate: 85 Nausea and Vomiting: no nausea and no vomiting Tactile Disturbances: none Tremor: no tremor Auditory Disturbances: not present Paroxysmal Sweats: no sweat visible Visual Disturbances: not present Anxiety: no anxiety, at ease Headache, Fullness in Head: none present Agitation: normal activity Orientation and Clouding of Sensorium: oriented and can do serial additions CIWA-Ar Total: 0 COWS:    PATIENT STRENGTHS: (choose at least two) Ability for insight Average or above average intelligence Capable of independent living Communication skills Financial means General fund of knowledge Motivation for treatment/growth Supportive family/friends  Allergies:  Allergies  Allergen Reactions  . Shellfish Allergy Anaphylaxis and Swelling    Swelling everywhere  . Sulfa Antibiotics Anaphylaxis, Hives, Itching and Swelling    Swelling all over     Home Medications:  (Not in a hospital admission)  OB/GYN Status:  No LMP for male patient.  General Assessment Data Location of Assessment: [facility] ED TTS Assessment: In system Is this a Tele or Face-to-Face Assessment?: Tele Assessment Is this an Initial Assessment or a Re-assessment for this encounter?: Initial Assessment Marital status: Single Maiden name: NA Is patient  pregnant?: No Pregnancy Status: No Living Arrangements: Alone Can pt return to current living arrangement?: Yes Admission Status: Voluntary Is patient capable of signing voluntary admission?: Yes Referral Source: Self/Family/Friend Insurance type: Medicare and Medicaid     Crisis Care Plan Living Arrangements: Alone Legal Guardian: Other: (self) Name of Psychiatrist: None Name of Therapist: None  Education Status Is patient currently in school?: No Current Grade: NA Highest grade of school patient has completed: 12 Name of school: NA Contact person: NA  Risk to self with the past 6 months Suicidal Ideation: Yes-Currently Present Has patient been a risk to self within the past 6 months prior to admission? : Yes Suicidal Intent: No Has patient had any suicidal intent within the past 6 months prior to admission? : No Is patient at risk for suicide?: Yes Suicidal Plan?: No Has patient had any suicidal plan within the past 6 months prior to admission? : No Access to Means: No What has been your use of drugs/alcohol within the last 12 months?: Pt reports heavy alcohol use Previous Attempts/Gestures: No How many times?: 0 Other Self Harm Risks: None Triggers for Past Attempts: None known Intentional Self Injurious Behavior: None Family Suicide History: No Recent stressful life event(s): Loss (Comment) (Mother and sister died in MVA) Persecutory voices/beliefs?: No Depression: Yes Depression Symptoms: Despondent, Tearfulness, Isolating, Guilt, Loss of interest in usual pleasures, Feeling worthless/self pity, Feeling angry/irritable Substance abuse history and/or treatment  for substance abuse?: Yes Suicide prevention information given to non-admitted patients: Not applicable  Risk to Others within the past 6 months Homicidal Ideation: No Does patient have any lifetime risk of violence toward others beyond the six months prior to admission? : No Thoughts of Harm to Others:  No Current Homicidal Intent: No Current Homicidal Plan: No Access to Homicidal Means: No Identified Victim: None History of harm to others?: No Assessment of Violence: None Noted Violent Behavior Description: Pt denies history of violence Does patient have access to weapons?: No Criminal Charges Pending?: No Does patient have a court date: No Is patient on probation?: No  Psychosis Hallucinations: None noted Delusions: None noted  Mental Status Report Appearance/Hygiene: In scrubs Eye Contact: Good Motor Activity: Unremarkable Speech: Logical/coherent Level of Consciousness: Alert Mood: Depressed Affect: Depressed Anxiety Level: Minimal Thought Processes: Coherent, Relevant Judgement: Unimpaired Orientation: Person, Place, Time, Situation, Appropriate for developmental age Obsessive Compulsive Thoughts/Behaviors: None  Cognitive Functioning Concentration: Normal Memory: Recent Intact, Remote Intact IQ: Average Insight: Good Impulse Control: Fair Appetite: Fair Weight Loss: 0 Weight Gain: 0 Sleep: Decreased Total Hours of Sleep: 4 Vegetative Symptoms: None  ADLScreening [facility] Assessment Services) Patient's cognitive ability adequate to safely complete daily activities?: Yes Patient able to express need for assistance with ADLs?: Yes Independently performs ADLs?: Yes (appropriate for developmental age)  Prior Inpatient Therapy Prior Inpatient Therapy: Yes Prior Therapy Dates: "years ago" Prior Therapy Facilty/Provider(s): Unknown Reason for Treatment: Alcohol detox  Prior Outpatient Therapy Prior Outpatient Therapy: No Prior Therapy Dates: NA Prior Therapy Facilty/Provider(s): NA Reason for Treatment: NA Does patient have an ACCT team?: No Does patient have Intensive In-House Services?  : No Does patient have [facility] services? : No Does patient have P4CC services?: No  ADL Screening (condition at time of admission) Patient's cognitive ability adequate to  safely complete daily activities?: Yes Is the patient deaf or have difficulty hearing?: No Does the patient have difficulty seeing, even when wearing glasses/contacts?: No Does the patient have difficulty concentrating, remembering, or making decisions?: No Patient able to express need for assistance with ADLs?: Yes Does the patient have difficulty dressing or bathing?: No Independently performs ADLs?: Yes (appropriate for developmental age) Does the patient have difficulty walking or climbing stairs?: No Weakness of Legs: None Weakness of Arms/Hands: None  Home Assistive Devices/Equipment Home Assistive Devices/Equipment: None    Abuse/Neglect Assessment (Assessment to be complete while patient is alone) Physical Abuse: Denies Verbal Abuse: Denies Sexual Abuse: Denies Exploitation of patient/patient's resources: Denies Self-Neglect: Denies     Merchant navy officer (For Healthcare) Does Patient Have a Medical Advance Directive?: No Would patient like information on creating a medical advance directive?: No - Patient declined    Additional Information 1:1 In Past 12 Months?: No CIRT Risk: No Elopement Risk: No Does patient have medical clearance?: Yes     Disposition: Binnie Rail, AC at [facility], confirmed adult bed will be available later today. Gave clinical report to Nira Conn, NP who said Pt meets criteria for inpatient dual-diagnosis treatment and accepted Pt to the service of Dr. Carmon Ginsberg. Cobos when bed is available. Notified Roxy Horseman, PA-C and Gladstone Lighter, RN of acceptance and bed situation.  Disposition Initial Assessment Completed for this Encounter: Yes Disposition of Patient: Inpatient treatment program Type of inpatient treatment program: Adult  This service was provided via telemedicine using a 2-way, interactive audio and video technology.  Names of all persons participating in this telemedicine service and their role in this encounter. -  None  Harlin RainFord  Ellis Patsy BaltimoreWarrick Jr, LPC, [facility], [facility] Triage Specialist [phone]  Pamalee LeydenWarrick Jr, Jeraline Marcinek Ellis 09/18/2016 4:06 AM

## 2016-09-18 NOTE — ED Triage Notes (Signed)
The pt reports that  He is depressed and suicidal

## 2016-09-18 NOTE — ED Notes (Signed)
Breakfast ordered regular  

## 2016-09-18 NOTE — ED Notes (Signed)
Pt given snack pack and caffeine free coke

## 2016-09-18 NOTE — ED Notes (Signed)
Pt wanded by security, pt's medication logged and secured by pharm tech

## 2016-09-18 NOTE — ED Notes (Signed)
Pt given graham crackers and peanut butter for snack. Pt also given 2 warm blankets

## 2016-09-18 NOTE — ED Notes (Signed)
Sprite given 

## 2016-09-18 NOTE — ED Notes (Signed)
Patient didn't want what was ordered for breakfast.  Eggs and Sausage was ordered for Pt.

## 2016-09-18 NOTE — Progress Notes (Signed)
[name] is a 56 year old male being admitted voluntarily to [facility] from [facility].  He came to the ED for increasing depression, suicidal ideation and alcohol abuse.  He reported recent stressors was losing his mother and sister in a motor vehicle accident last month.  His drinking has increased over the past month.  He is drinking 18 beers per day.  He denies any pain or discomfort and appears to be in no physical distress.  He denies suicidal ideation at this time and will contract for safety on the unit.  He denies HI or A/V hallucinations.  Oriented him to the unit.  Admission paperwork completed and signed.  Belongings searched and secured in locker # 50.  Skin assessment completed and noted dry skin on both feet.  Q 15 minute checks initiated for safety.  We will monitor the progress towards his goals.

## 2016-09-18 NOTE — ED Notes (Signed)
Attempted Report 

## 2016-09-18 NOTE — Progress Notes (Signed)
Patient ID: [name], male   DOB: [birth year], 56 y.o.   MRN: [id number] Per State regulations 482.30 this chart was reviewed for medical necessity with respect to the patient's admission/duration of stay.    Next review date: 09/22/16  Thurman CoyerEric Jacaria Colburn, BSN, RN-BC  Case Manager

## 2016-09-19 ENCOUNTER — Encounter (HOSPITAL_COMMUNITY): Payer: Self-pay

## 2016-09-19 DIAGNOSIS — B20 Human immunodeficiency virus [HIV] disease: Secondary | ICD-10-CM

## 2016-09-19 DIAGNOSIS — F1990 Other psychoactive substance use, unspecified, uncomplicated: Secondary | ICD-10-CM

## 2016-09-19 DIAGNOSIS — F332 Major depressive disorder, recurrent severe without psychotic features: Principal | ICD-10-CM

## 2016-09-19 DIAGNOSIS — R45851 Suicidal ideations: Secondary | ICD-10-CM

## 2016-09-19 MED ORDER — CHLORDIAZEPOXIDE HCL 25 MG PO CAPS: 25.0000 mg | ORAL_CAPSULE | ORAL | Status: DC

## 2016-09-19 MED ORDER — ADULT MULTIVITAMIN W/MINERALS CH
1.0000 | ORAL_TABLET | Freq: Every day | ORAL | Status: DC
  Administered 2016-09-19 – 2016-09-21 (×2): 1 via ORAL
  Filled 2016-09-19 (×5): qty 1

## 2016-09-19 MED ORDER — CHLORDIAZEPOXIDE HCL 25 MG PO CAPS
25.0000 mg | ORAL_CAPSULE | Freq: Three times a day (TID) | ORAL | Status: DC
  Administered 2016-09-20: 25 mg via ORAL
  Filled 2016-09-19: qty 1

## 2016-09-19 MED ORDER — BUPROPION HCL ER (XL) 300 MG PO TB24
300.0000 mg | ORAL_TABLET | ORAL | Status: DC
  Administered 2016-09-19 – 2016-09-21 (×3): 300 mg via ORAL
  Filled 2016-09-19 (×5): qty 1

## 2016-09-19 MED ORDER — IBUPROFEN 800 MG PO TABS
800.0000 mg | ORAL_TABLET | Freq: Three times a day (TID) | ORAL | Status: DC | PRN
  Administered 2016-09-19 (×2): 800 mg via ORAL
  Filled 2016-09-19 (×2): qty 1

## 2016-09-19 MED ORDER — ONDANSETRON 4 MG PO TBDP
4.0000 mg | ORAL_TABLET | Freq: Four times a day (QID) | ORAL | Status: DC | PRN
  Administered 2016-09-20: 4 mg via ORAL
  Filled 2016-09-19: qty 1

## 2016-09-19 MED ORDER — LOPERAMIDE HCL 2 MG PO CAPS: 2.0000 mg | ORAL_CAPSULE | ORAL | Status: DC | PRN

## 2016-09-19 MED ORDER — BACLOFEN 10 MG PO TABS: 10.0000 mg | ORAL_TABLET | Freq: Three times a day (TID) | ORAL | Status: DC | PRN

## 2016-09-19 MED ORDER — CHLORDIAZEPOXIDE HCL 25 MG PO CAPS
25.0000 mg | ORAL_CAPSULE | Freq: Four times a day (QID) | ORAL | Status: AC
  Administered 2016-09-19 (×4): 25 mg via ORAL
  Filled 2016-09-19 (×4): qty 1

## 2016-09-19 MED ORDER — CHLORDIAZEPOXIDE HCL 25 MG PO CAPS: 25.0000 mg | ORAL_CAPSULE | Freq: Every day | ORAL | Status: DC

## 2016-09-19 MED ORDER — EMTRICITABINE-TENOFOVIR AF 200-25 MG PO TABS
1.0000 | ORAL_TABLET | Freq: Every day | ORAL | Status: DC
  Administered 2016-09-19 – 2016-09-21 (×3): 1 via ORAL
  Filled 2016-09-19 (×5): qty 1

## 2016-09-19 MED ORDER — HYDROXYZINE HCL 25 MG PO TABS
25.0000 mg | ORAL_TABLET | Freq: Four times a day (QID) | ORAL | Status: DC | PRN
  Administered 2016-09-19 – 2016-09-20 (×3): 25 mg via ORAL
  Filled 2016-09-19 (×3): qty 1

## 2016-09-19 MED ORDER — CHLORDIAZEPOXIDE HCL 25 MG PO CAPS: 25.0000 mg | ORAL_CAPSULE | Freq: Four times a day (QID) | ORAL | Status: DC | PRN

## 2016-09-19 MED ORDER — DARUNAVIR-COBICISTAT 800-150 MG PO TABS
1.0000 | ORAL_TABLET | Freq: Every day | ORAL | Status: DC
  Administered 2016-09-19 – 2016-09-21 (×3): 1 via ORAL
  Filled 2016-09-19 (×5): qty 1

## 2016-09-19 MED ORDER — THIAMINE HCL 100 MG/ML IJ SOLN
100.0000 mg | Freq: Once | INTRAMUSCULAR | Status: DC
  Filled 2016-09-19: qty 2

## 2016-09-19 MED ORDER — VITAMIN B-1 100 MG PO TABS
100.0000 mg | ORAL_TABLET | Freq: Every day | ORAL | Status: DC
  Administered 2016-09-21: 100 mg via ORAL
  Filled 2016-09-19 (×4): qty 1

## 2016-09-19 NOTE — H&P (Signed)
Psychiatric Admission Assessment Adult  Patient Identification: [name] MRN:  [id number] Date of Evaluation:  09/19/2016 Chief Complaint:  Worsening depression with suicidal thoughts  Principal Diagnosis: MDD                                        SUD Diagnosis:   Patient Active Problem List   Diagnosis Date Noted  . MDD (major depressive disorder), recurrent severe, without psychosis ([place]) [F33.2] 09/18/2016  . 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 Human immunodeficiency virus (HIV) disease ([place]) [B20] 11/09/2008   History of Present Illness:  56 y.o AAM, single, lives alone, unemployed. Background history of depression and chronic medical issues as above. Self presented to the ER seeking help. Relapsed on alcohol and substances in the past month. Has been overwhelmed by recent deaths in his family. Has been more depressed lately. Was having suicidal thoughts at home. BAL was 196 mg/dl. UDS was positive for cocaine.   Patient was in bed and could not come out of his room. Says he is not feeling great. He is aching and coming off substances. Says he had lost motivation to do things since his mom and sister died in a MVA a month ago. He is crying a lot, he is not eating, he is not sleeping. He has been drinking alcohol on a daily basis. He has been using cocaine frequently. Says he has been having worsening suicidal thoughts as life has lost it's meaning for him.  Patient says he came in to seek help before he does anything to self. No current plans. No associated hallucinations. No delusional preoccupation. No passivity phenomena. No associated homicidal or violent thoughts. Not reporting any other stressors at home. No access to weapons.   Total Time spent with patient: 1 hour  Past Psychiatric History: Long history of Unipolar Depression. Says he has been managed by his PCP. He does respond to bupropion. Says he quit taking it for a while. He has had short admission in the past. Says all  was in context of substance use. Denies any past history of suicidal attempt. Denies any past history of mania. No past history of violent behavior.   Is the patient at risk to self? Yes.    Has the patient been a risk to self in the past 6 months? Yes.    Has the patient been a risk to self within the distant past? Yes.    Is the patient a risk to others? No.  Has the patient been a risk to others in the past 6 months? No.  Has the patient been a risk to others within the distant past? No.   Prior Inpatient Therapy:   Prior Outpatient Therapy:    Alcohol Screening: 1. How often do you have a drink containing alcohol?: 4 or more times a week 2. How many drinks containing alcohol do you have on a typical day when you are drinking?: 10 or more 3. How often do you have six or more drinks on one occasion?: Daily or almost daily Preliminary Score: 8 4. How often during the last year have you found that you were not able to stop drinking once you had started?: Daily or almost daily 5. How often during the last year have you failed to do what was normally expected from you becasue of drinking?: Weekly 6. How often during the last year have you  needed a first drink in the morning to get yourself going after a heavy drinking session?: Daily or almost daily 7. How often during the last year have you had a feeling of guilt of remorse after drinking?: Weekly 8. How often during the last year have you been unable to remember what happened the night before because you had been drinking?: Weekly 9. Have you or someone else been injured as a result of your drinking?: No 10. Has a relative or friend or a doctor or another health worker been concerned about your drinking or suggested you cut down?: Yes, during the last year Alcohol Use Disorder Identification Test Final Score (AUDIT): 33 Brief Intervention: AUDIT score less than 7 or less-screening does not suggest unhealthy drinking-brief intervention not  indicated Substance Abuse History in the last 12 months:  Yes.   Consequences of Substance Abuse: Increased blood pressure, worsening depression.  Previous Psychotropic Medications: Yes  Psychological Evaluations: Yes  Past Medical History:  Past Medical History:  Diagnosis Date  . 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 Hemorrhoids   . HIV (human immunodeficiency virus infection) ([place])     Past Surgical History:  Procedure Laterality Date  . HERNIA REPAIR  97   lt ing  . RECTAL EXAM UNDER ANESTHESIA N/A 04/24/2014   Procedure:  ligation of bleeding vessels;  Surgeon: Stark Klein, MD;  Location: [facility];  Service: General;  Laterality: N/A;  . TENDON REPAIR     2006-lt index  . WART FULGURATION  07/06/2011   Procedure: FULGURATION ANAL WART;  Surgeon: Harl Bowie, MD;  Location: [facility];  Service: General;  Laterality: N/A;  excision anal condyloma  . WART FULGURATION N/A 04/23/2014   Procedure: EXCISION OF ANAL CONDYLOMA;  Surgeon: Coralie Keens, MD;  Location: [facility];  Service: General;  Laterality: N/A;   Family History:  Family History  Problem Relation Age of Onset  . Cancer Mother        brain  . Cancer Sister        breast   Family Psychiatric  History: None Tobacco Screening: Have you used any form of tobacco in the last 30 days? (Cigarettes, Smokeless Tobacco, Cigars, and/or Pipes): Yes Tobacco use, Select all that apply: 4 or less cigarettes per day Are you interested in Tobacco Cessation Medications?: No, patient refused Counseled patient on smoking cessation including recognizing danger situations, developing coping skills and basic information about quitting provided: Refused/Declined practical counseling Social History:  History  Alcohol Use  . 0.0 oz/week    Comment: occasional beer      History  Drug Use No    Additional Social History:      Lives alone, no kids, not in a relationship, no support. On SSI. No legal issues. No military  experience. Allergies:   Allergies  Allergen Reactions  . Shellfish Allergy Anaphylaxis and Swelling    Swelling everywhere  . Sulfa Antibiotics Anaphylaxis, Hives, Itching and Swelling    Swelling all over    Lab Results:  Results for orders placed or performed during the hospital encounter of 09/18/16 (from the past 48 hour(s))  Comprehensive metabolic panel     Status: None   Collection Time: 09/18/16  1:19 AM  Result Value Ref Range   Sodium 136 135 - 145 mmol/L   Potassium 4.4 3.5 - 5.1 mmol/L   Chloride 104 101 - 111 mmol/L   CO2 22 22 - 32 mmol/L   Glucose, Bld 82 65 - 99  mg/dL   BUN 11 6 - 20 mg/dL   Creatinine, Ser 1.01 0.61 - 1.24 mg/dL   Calcium 9.2 8.9 - 10.3 mg/dL   Total Protein 8.0 6.5 - 8.1 g/dL   Albumin 4.7 3.5 - 5.0 g/dL   AST 36 15 - 41 U/L   ALT 26 17 - 63 U/L   Alkaline Phosphatase 38 38 - 126 U/L   Total Bilirubin 0.7 0.3 - 1.2 mg/dL   GFR calc non Af Amer >60 >60 mL/min   GFR calc Af Amer >60 >60 mL/min    Comment: (NOTE) The eGFR has been calculated using the CKD EPI equation. This calculation has not been validated in all clinical situations. eGFR's persistently <60 mL/min signify possible Chronic Kidney Disease.    Anion gap 10 5 - 15  Ethanol     Status: Abnormal   Collection Time: 09/18/16  1:19 AM  Result Value Ref Range   Alcohol, Ethyl (B) 196 (H) <5 mg/dL    Comment:        LOWEST DETECTABLE LIMIT FOR SERUM ALCOHOL IS 5 mg/dL FOR MEDICAL PURPOSES ONLY   cbc     Status: None   Collection Time: 09/18/16  1:19 AM  Result Value Ref Range   WBC 5.9 4.0 - 10.5 K/uL   RBC 4.83 4.22 - 5.81 MIL/uL   Hemoglobin 13.3 13.0 - 17.0 g/dL   HCT 40.5 39.0 - 52.0 %   MCV 83.9 78.0 - 100.0 fL   MCH 27.5 26.0 - 34.0 pg   MCHC 32.8 30.0 - 36.0 g/dL   RDW 14.4 11.5 - 15.5 %   Platelets 285 150 - 400 K/uL  Acetaminophen level     Status: Abnormal   Collection Time: 09/18/16  1:27 AM  Result Value Ref Range   Acetaminophen (Tylenol), Serum <10 (L)  10 - 30 ug/mL    Comment:        THERAPEUTIC CONCENTRATIONS VARY SIGNIFICANTLY. A RANGE OF 10-30 ug/mL MAY BE AN EFFECTIVE CONCENTRATION FOR MANY PATIENTS. HOWEVER, SOME ARE BEST TREATED AT CONCENTRATIONS OUTSIDE THIS RANGE. ACETAMINOPHEN CONCENTRATIONS >150 ug/mL AT 4 HOURS AFTER INGESTION AND >50 ug/mL AT 12 HOURS AFTER INGESTION ARE OFTEN ASSOCIATED WITH TOXIC REACTIONS.   Salicylate level     Status: None   Collection Time: 09/18/16  1:27 AM  Result Value Ref Range   Salicylate Lvl <8.3 2.8 - 30.0 mg/dL  Rapid urine drug screen (hospital performed)     Status: Abnormal   Collection Time: 09/18/16  1:37 AM  Result Value Ref Range   Opiates NONE DETECTED NONE DETECTED   Cocaine POSITIVE (A) NONE DETECTED   Benzodiazepines NONE DETECTED NONE DETECTED   Amphetamines NONE DETECTED NONE DETECTED   Tetrahydrocannabinol NONE DETECTED NONE DETECTED   Barbiturates NONE DETECTED NONE DETECTED    Comment:        DRUG SCREEN FOR MEDICAL PURPOSES ONLY.  IF CONFIRMATION IS NEEDED FOR ANY PURPOSE, NOTIFY LAB WITHIN 5 DAYS.        LOWEST DETECTABLE LIMITS FOR URINE DRUG SCREEN Drug Class       Cutoff (ng/mL) Amphetamine      1000 Barbiturate      200 Benzodiazepine   151 Tricyclics       761 Opiates          300 Cocaine          300 THC              50  Blood Alcohol level:  Lab Results  Component Value Date   ETH 196 (H) 09/18/2016   ETH (H) 03/14/2010    224        LOWEST DETECTABLE LIMIT FOR SERUM ALCOHOL IS 5 mg/dL FOR MEDICAL PURPOSES ONLY    Metabolic Disorder Labs:  No results found for: HGBA1C, MPG No results found for: PROLACTIN Lab Results  Component Value Date   CHOL 207 (H) 02/26/2015   TRIG 154 (H) 02/26/2015   HDL 97 02/26/2015   CHOLHDL 2.1 02/26/2015   VLDL 31 (H) 02/26/2015   LDLCALC 79 02/26/2015   LDLCALC 78 04/16/2013    Current Medications: Current Facility-Administered Medications  Medication Dose Route Frequency Provider Last  Rate Last Dose  . acetaminophen (TYLENOL) tablet 650 mg  650 mg Oral Q6H PRN Okonkwo, Justina A, NP      . alum & mag hydroxide-simeth (MAALOX/MYLANTA) 200-200-20 MG/5ML suspension 30 mL  30 mL Oral Q4H PRN Okonkwo, Justina A, NP      . baclofen (LIORESAL) tablet 10 mg  10 mg Oral TID PRN Laverle Hobby, PA-C      . buPROPion (WELLBUTRIN XL) 24 hr tablet 300 mg  300 mg Oral BH-q7a Simon, Spencer E, PA-C   300 mg at 09/19/16 7903  . chlordiazePOXIDE (LIBRIUM) capsule 25 mg  25 mg Oral Q6H PRN Laverle Hobby, PA-C      . chlordiazePOXIDE (LIBRIUM) capsule 25 mg  25 mg Oral QID Patriciaann Clan E, PA-C   25 mg at 09/19/16 1212   Followed by  . [START ON 09/20/2016] chlordiazePOXIDE (LIBRIUM) capsule 25 mg  25 mg Oral TID Laverle Hobby, PA-C       Followed by  . [START ON 09/21/2016] chlordiazePOXIDE (LIBRIUM) capsule 25 mg  25 mg Oral BH-qamhs Simon, Spencer E, PA-C       Followed by  . [START ON 09/22/2016] chlordiazePOXIDE (LIBRIUM) capsule 25 mg  25 mg Oral Daily Simon, Maurine Minister, PA-C      . darunavir-cobicistat (PREZCOBIX) 800-150 MG per tablet 1 tablet  1 tablet Oral Daily Laverle Hobby, PA-C   1 tablet at 09/19/16 0916  . emtricitabine-tenofovir AF (DESCOVY) 200-25 MG per tablet 1 tablet  1 tablet Oral Daily Laverle Hobby, PA-C   1 tablet at 09/19/16 0916  . hydrOXYzine (ATARAX/VISTARIL) tablet 25 mg  25 mg Oral Q6H PRN Laverle Hobby, PA-C   25 mg at 09/19/16 8333  . ibuprofen (ADVIL,MOTRIN) tablet 800 mg  800 mg Oral Q8H PRN Laverle Hobby, PA-C   800 mg at 09/19/16 1214  . loperamide (IMODIUM) capsule 2-4 mg  2-4 mg Oral PRN Patriciaann Clan E, PA-C      . magnesium hydroxide (MILK OF MAGNESIA) suspension 30 mL  30 mL Oral Daily PRN Okonkwo, Justina A, NP      . multivitamin with minerals tablet 1 tablet  1 tablet Oral Daily Laverle Hobby, PA-C   1 tablet at 09/19/16 0917  . ondansetron (ZOFRAN-ODT) disintegrating tablet 4 mg  4 mg Oral Q6H PRN Patriciaann Clan E, PA-C      .  thiamine (B-1) injection 100 mg  100 mg Intramuscular Once Laverle Hobby, PA-C      . [START ON 09/20/2016] thiamine (VITAMIN B-1) tablet 100 mg  100 mg Oral Daily Simon, Spencer E, PA-C      . traZODone (DESYREL) tablet 50 mg  50 mg Oral QHS PRN Vicenta Aly, NP  PTA Medications: Prescriptions Prior to Admission  Medication Sig Dispense Refill Last Dose  . baclofen (LIORESAL) 10 MG tablet Take 10 mg by mouth 3 (three) times daily as needed for muscle spasms.    at prn  . buPROPion (WELLBUTRIN XL) 300 MG 24 hr tablet Take 300 mg by mouth every morning.    Not Taking at Unknown time  . darunavir-cobicistat (PREZCOBIX) 800-150 MG tablet Take 1 tablet by mouth daily. Swallow whole. Do NOT crush, break or chew tablets. Take with food. 30 tablet 0 09/17/2016 at Unknown time  . emtricitabine-tenofovir AF (DESCOVY) 200-25 MG tablet Take 1 tablet by mouth daily. 30 tablet 0 09/17/2016 at Unknown time  . ibuprofen (ADVIL,MOTRIN) 800 MG tablet Take 800 mg by mouth every 8 (eight) hours as needed for headache, mild pain or moderate pain.     at prn    Musculoskeletal: Strength & Muscle Tone: Unable to assess at this time.  Gait & Station: unable to stand Patient leans: N/A  Psychiatric Specialty Exam: Physical Exam  Constitutional: He is oriented to person, place, and time. He appears distressed.  Respiratory: Effort normal.  Neurological: He is alert and oriented to person, place, and time.  Skin: He is not diaphoretic.  Psychiatric:  As above    ROS  Blood pressure 136/86, pulse 73, temperature 98.1 F (36.7 C), temperature source Oral, resp. rate 20, height 6' (1.829 m), weight 74.2 kg (163 lb 8 oz).Body mass index is 22.17 kg/m.  General Appearance: In hospital clothing, poor personal hygiene, minimal engagement, appears to be in distress. No tremors.  Eye Contact:  Minimal  Speech:  Decreased rate and tone  Volume:  Decreased  Mood:  Depressed  Affect:  Blunted and mood  congruent  Thought Process:  Decreased speed of thought. Linear and goal directed.  Orientation:  Full (Time, Place, and Person)  Thought Content:  Rumination  Suicidal Thoughts:  Off and on thoughts. No intent to act in here.  Homicidal Thoughts:  No  Memory:  Immediate;   Fair Recent;   Fair Remote;   Fair  Judgement:  Fair  Insight:  Fair  Psychomotor Activity:  Decreased  Concentration:  Concentration: Fair and Attention Span: Fair  Recall:  [employer] of Knowledge:  Fair  Language:  Good  Akathisia:  Negative  Handed:    AIMS (if indicated):     Assets:  Desire for Improvement Housing Resilience  ADL's:  Impaired  Cognition:  Impaired,  Mild  Sleep:  Number of Hours: 6.75    Treatment Plan Summary:  Unipolar depression in context of chronic medical illness and substance use disorder. Recent episode precipitated by unexpected death of his family members. It has been perpetuated by increased substance use and lack of support. Patient is withdrawing from alcohol and is coming off cocaine. He is physically frail and dysphoric. He remains at risk to self. We plan to supervise detox and reestablish his antidepressant medication.   Psychiatric: MDD Recurrent SUD  Medical: HIV  Psychosocial:  Bereavement Chronic medical illness Social isolation Limited support   PLAN: 1. Alcohol withdrawal protocol 2. Bupropion XL 300 mg daily 3. Encourage unit groups and activities 4. Monitor mood, behavior and interaction with peers 5. Motivational enhancement  6. SW would facilitate aftercare.    Observation Level/Precautions:  15 minute checks  Laboratory:    Psychotherapy:    Medications:    Consultations:    Discharge Concerns:    Estimated LOS:  Other:  Physician Treatment Plan for Primary Diagnosis: <principal problem not specified> Long Term Goal(s): Improvement in symptoms so as ready for discharge  Short Term Goals: Ability to identify changes in lifestyle  to reduce recurrence of condition will improve, Ability to verbalize feelings will improve, Ability to disclose and discuss suicidal ideas, Ability to demonstrate self-control will improve, Ability to identify and develop effective coping behaviors will improve, Ability to maintain clinical measurements within normal limits will improve, Compliance with prescribed medications will improve and Ability to identify triggers associated with substance abuse/mental health issues will improve  Physician Treatment Plan for Secondary Diagnosis: Active Problems:   MDD (major depressive disorder), recurrent severe, without psychosis ([place])  Long Term Goal(s): Improvement in symptoms so as ready for discharge  Short Term Goals: Ability to identify changes in lifestyle to reduce recurrence of condition will improve, Ability to verbalize feelings will improve, Ability to disclose and discuss suicidal ideas, Ability to demonstrate self-control will improve, Ability to identify and develop effective coping behaviors will improve, Ability to maintain clinical measurements within normal limits will improve, Compliance with prescribed medications will improve and Ability to identify triggers associated with substance abuse/mental health issues will improve  I certify that inpatient services furnished can reasonably be expected to improve the patient's condition.    [place], MD 9/11/20182:58 PM

## 2016-09-19 NOTE — [facility] Counselor (Signed)
Adult Comprehensive Assessment  Patient ID: [name], male   DOB: 1960/10/20, 56 y.o.   MRN: [id number]  Information Source: Information source: Patient  Current Stressors:  Educational / Learning stressors: graduated high school Employment / Job issues:  on disability Family Relationships: poor-single; no kids; parents deceased--mother and sistered died in car accident last month Surveyor, quantityinancial / Lack of resources (include bankruptcy): disability; medicare, and [employer] Housing / Lack of housing: living in apt in [place] by himself  Physical health (include injuries & life threatening diseases): HIV positive Social relationships: poor-few social supports Substance abuse: alcohol-heavy use ongoing for years "My use increased after last month when my mother and sister died" Bereavement / Loss: pt's mother and sister died in car accident last month.   Living/Environment/Situation:  Living Arrangements: Alone Living conditions (as described by patient or guardian): living in apt.  How long has patient lived in current situation?: several years  What is atmosphere in current home: Comfortable  Family History:  Marital status: Single Are you sexually active?: No What is your sexual orientation?: pt declined to comment Has your sexual activity been affected by drugs, alcohol, medication, or emotional stress?: n/a  Does patient have children?: No  Childhood History:  By whom was/is the patient raised?: Mother, Grandparents Additional childhood history information: mother and grandmother were primary caretakers; father in and out in childhood.  Description of patient's relationship with caregiver when they were a child: close to all caregivers  Patient's description of current relationship with people who raised him/her: close to father-He died of cancer. mother died last month-pt is grieving and drinking heavily How were you disciplined when you got in trouble as a child/adolescent?:  n/a  Does patient have siblings?: Yes Number of Siblings: 1 Description of patient's current relationship with siblings: sister-died in car accident last month with his mother.  Did patient suffer any verbal/emotional/physical/sexual abuse as a child?: No Did patient suffer from severe childhood neglect?: No Has patient ever been sexually abused/assaulted/raped as an adolescent or adult?: No Was the patient ever a victim of a crime or a disaster?: No Witnessed domestic violence?: No Has patient been effected by domestic violence as an adult?: No  Education:  Highest grade of school patient has completed: 12 Currently a student?: No Name of school: NA Learning disability?: No  Employment/Work Situation:   Employment situation: On disability Why is patient on disability: HIV How long has patient been on disability: several years Patient's job has been impacted by current illness: No What is the longest time patient has a held a job?: pt declined to say Where was the patient employed at that time?: pt declined to say  Has patient ever been in the [employer] and Companymilitary?: No Has patient ever served in combat?: No Did You Receive Any Psychiatric Treatment/Services While in Equities traderthe Military?: No Are There Guns or Other Weapons in Your Home?: No Are These Weapons Safely Secured?:  (n/a)  Financial Resources:   Financial resources: Insurance claims handlereceives SSDI, Medicare, Medicaid Does patient have a representative payee or guardian?: No  Alcohol/Substance Abuse:   What has been your use of drugs/alcohol within the last 12 months?: heavy alcohol abuse-ongoing. use increased over the past month after death of family members If attempted suicide, did drugs/alcohol play a role in this?: No Alcohol/Substance Abuse Treatment Hx: Past Tx, Inpatient If yes, describe treatment: hospital several years ago; [facility] years ago.  Has alcohol/substance abuse ever caused legal problems?: No  Social Support System:   Patient's  Community Support System: Poor Museum/gallery exhibitions officer System: few friends in community Type of faith/religion: christian How does patient's faith help to cope with current illness?: prayer   Leisure/Recreation:   Leisure and Hobbies: "nothing"  Strengths/Needs:   What things does the patient do well?: "I don't know." In what areas does patient struggle / problems for patient: coping with grief/loss; alcohol addiction is ongoing; limited insight; limited social supports   Discharge Plan:   Does patient have access to transportation?: Yes (bus) Will patient be returning to same living situation after discharge?: No Plan for living situation after discharge: pt hoping for direct [facility] or [facility]. "I'd rather [facility]."  Currently receiving community mental health services: No If no, would patient like referral for services when discharged?: Yes (What county?) Medical sales representative) Does patient have financial barriers related to discharge medications?: No  Summary/Recommendations:   Summary and Recommendations (to be completed by the evaluator): Patient is 56yo male living in [place], [place] He presents to the hospital seeking treatment for SI, depression, and for alcohol detox. Patient currently denies SI/HI/AVH. He has a diagnosis of MDD, recurrent, severe, and Alcohol Use Disorder, severe. Patient is hoping to get into residential treatment from here. Recommendations for patient include: crisis stabilization, therapeutic milieu, encourage group attendance and participation, medication management for detox/mood stabilization, and development of comprehensive mental wellness/sobriety plan.   Ledell Peoples Smart LCSW 09/19/2016 4:02 PM

## 2016-09-19 NOTE — Progress Notes (Signed)
Nursing Note 09/19/2016 [id number]  Data Reports sleeping poor without PRN sleep med.  Rates depression 8/10, hopelessness 8/10, and anxiety 8/10. Affect depressed "I feel awful today."  Denies HI, SI, AVH.  C/O generalized aches which he is taking PRN ibuprofen for.   Action Spoke with patient 1:1, nurse offered support to patient throughout shift.  PRN ibuprofen.  Continues to be monitored on 15 minute checks for safety.  Response Ibuprofen effective.  Remains safe and appropriate on unit.

## 2016-09-19 NOTE — [facility] Suicide Risk Assessment (Signed)
[facility] INPATIENT:  Family/Significant Other Suicide Prevention Education  Suicide Prevention Education:  Patient Refusal for Family/Significant Other Suicide Prevention Education: The patient [name] has refused to provide written consent for family/significant other to be provided Family/Significant Other Suicide Prevention Education during admission and/or prior to discharge.  Physician notified.  SPE completed with pt, as pt refused to consent to family contact. SPI pamphlet provided to pt and pt was encouraged to share information with support network, ask questions, and talk about any concerns relating to SPE. Pt denies access to guns/firearms and verbalized understanding of information provided. Mobile Crisis information also provided to pt.   [name] Endres N Smart LCSW 09/19/2016, 4:02 PM

## 2016-09-19 NOTE — B[facility] Suicide Risk Assessment (Signed)
[facility]   Nursing information obtained from:  Patient Demographic factors:  Male, Unemployed, Living alone Current Mental Status:  Suicidal ideation indicated by patient Loss Factors:  Loss of significant relationship Historical Factors:  Family history of mental illness or substance abuse, Anniversary of important loss Risk Reduction Factors:  NA  Total Time spent with patient: 30 minutes Principal Problem: MDD (major depressive disorder), recurrent severe, without psychosis (HCC) Diagnosis:   Patient Active Problem List   Diagnosis Date Noted  . Substance use disorder [F19.90] 09/19/2016  . MDD (major depressive disorder), recurrent severe, without psychosis (HCC) [F33.2] 09/18/2016  . 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 Human immunodeficiency virus (HIV) disease (HCC) [B20] 11/09/2008   Subjective Data:  56 y.o AAM, single, lives alone, unemployed. Background history of depression and chronic medical issues as above. Self presented to the ER seeking help. Relapsed on alcohol and substances in the past month. Has been overwhelmed by recent deaths in his family. Has been more depressed lately. Was having suicidal thoughts at home. BAL was 196 mg/dl. UDS was positive for cocaine.  Grief, limited support and maladaptive coping with substances. No associated psychosis. No associated homicidal thoughts. No associated thoughts of violence. No access to weapons. Wants to get better. Cooperative with care.   Continued Clinical Symptoms:  Alcohol Use Disorder Identification Test Final Score (AUDIT): 33 The "Alcohol Use Disorders Identification Test", Guidelines for Use in Primary Care, Second Edition.  World Science writerHealth Organization [facility](WHO). Score between 0-7:  no or low risk or alcohol related problems. Score between 8-15:  moderate risk of alcohol related problems. Score between 16-19:  high risk of alcohol related problems. Score 20 or above:  warrants further  diagnostic evaluation for alcohol dependence and treatment.   CLINICAL FACTORS:   Depression:   Severe Alcohol/Substance Abuse/Dependencies   Musculoskeletal: Strength & Muscle Tone: within normal limits Gait & Station: normal Patient leans: N/A  Psychiatric Specialty Exam: Physical Exam  ROS  Blood pressure 136/86, pulse 73, temperature 98.1 F (36.7 C), temperature source Oral, resp. rate 20, height 6' (1.829 m), weight 74.2 kg (163 lb 8 oz).Body mass index is 22.17 kg/m.  General Appearance: As in H&P  Eye Contact:  As in H&P  Speech:  As in H&P  Volume:  As in H&P  Mood:  As in H&P  Affect:  As in H&P  Thought Process:  As in H&P  Orientation:  As in H&P  Thought Content:  As in H&P  Suicidal Thoughts:  As in H&P  Homicidal Thoughts:  As in H&P  Memory:  As in H&P  Judgement:  As in H&P  Insight:  As in H&P  Psychomotor Activity:  As in H&P  Concentration:  As in H&P  Recall:  As in H&P  Fund of Knowledge:  As in H&P  Language:  As in H&P  Akathisia:  As in H&P  Handed:  As in H&P  AIMS (if indicated):     Assets:  As in H&P  ADL's:  As in H&P  Cognition:  As in H&P  Sleep:  Number of Hours: 6.75      COGNITIVE FEATURES THAT CONTRIBUTE TO RISK:  Loss of executive function    SUICIDE RISK:   Mild:  Suicidal ideation of limited frequency, intensity, duration, and specificity.  There are no identifiable plans, no associated intent, mild dysphoria and related symptoms, good self-control (both objective and subjective assessment), few other risk factors, and identifiable  protective factors, including available and accessible social support.  PLAN OF CARE:  As in H&P  I certify that inpatient services furnished can reasonably be expected to improve the patient's condition.   Georgiann Cocker, MD 09/19/2016, 3:29 PM
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
## 2016-09-20 MED ORDER — INFLUENZA VAC SPLIT QUAD 0.5 ML IM SUSY
0.5000 mL | PREFILLED_SYRINGE | INTRAMUSCULAR | Status: AC
  Administered 2016-09-21: 0.5 mL via INTRAMUSCULAR
  Filled 2016-09-20: qty 0.5

## 2016-09-20 NOTE — Progress Notes (Signed)
Recreation Therapy Notes  Date: 09/20/16 Time: 0930 Location: 300 [address] Dayroom  Group Topic: Stress Management  Goal Area(s) Addresses:  Patient will verbalize importance of using healthy stress management.  Patient will identify positive emotions associated with healthy stress management.   Behavioral Response: Engaged  Intervention: Stress Management  Activity :  Guided Imagery.  LRT introduced the stress management technique of guided imagery.  LRT read a script to allow patients to participate in the activity.  Patients were to follow along as LRT read the script to engage in the activity.  Education:  Stress Management, Discharge Planning.   Education Outcome: Acknowledges edcuation/In group clarification offered/Needs additional education  Clinical Observations/Feedback: Pt attended group.   Caroll RancherMarjette Carlon Davidson, LRT/CTRS         Caroll RancherLindsay, Handsome Anglin A 09/20/2016 11:58 AM

## 2016-09-20 NOTE — BHH Group Notes (Signed)
LCSW Group Therapy Note   09/20/2016 1:15pm   Type of Therapy and Topic:  Group Therapy:  Overcoming Obstacles   Participation Level:  Did Not Attend-pt invited. Chose to remain in bed.    Description of Group:    In this group patients will be encouraged to explore what they see as obstacles to their own wellness and recovery. They will be guided to discuss their thoughts, feelings, and behaviors related to these obstacles. The group will process together ways to cope with barriers, with attention given to specific choices patients can make. Each patient will be challenged to identify changes they are motivated to make in order to overcome their obstacles. This group will be process-oriented, with patients participating in exploration of their own experiences as well as giving and receiving support and challenge from other group members.   Therapeutic Goals: 1. Patient will identify personal and current obstacles as they relate to admission. 2. Patient will identify barriers that currently interfere with their wellness or overcoming obstacles.  3. Patient will identify feelings, thought process and behaviors related to these barriers. 4. Patient will identify two changes they are willing to make to overcome these obstacles:      Summary of Patient Progress   x   Therapeutic Modalities:   Cognitive Behavioral Therapy Solution Focused Therapy Motivational Interviewing Relapse Prevention Therapy  Ledell PeoplesHeather N Smart, LCSW 09/20/2016 10:56 AM

## 2016-09-20 NOTE — Progress Notes (Signed)
Psychoeducational Group Note  Date:  09/20/2016 Time:  2226  Group Topic/Focus:  Wrap-Up Group:   The focus of this group is to help patients review their daily goal of treatment and discuss progress on daily workbooks.  Participation Level: Did Not Attend  Participation Quality:  Not Applicable  Affect:  Not Applicable  Cognitive:  Not Applicable  Insight:  Not Applicable  Engagement in Group: Not Applicable  Additional Comments:  Patient did not attend group and remained in the hallway for the most part.   Hazle CocaGOODMAN, Ily Denno S 09/20/2016, 10:26 PM
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
## 2016-09-20 NOTE — Progress Notes (Signed)
Patient ID: Valentino Nosenthony Mara, male   DOB: 02/07/1960, 56 y.o.   MRN: [id number]  Pt currently presents with a flat affect and anxious behavior. Pt mood is irritable, forwards little to Clinical research associatewriter. Pt reports to writer that their goal is to "get ready to go home tomorrow." Pt states "I think I am ready to leave." Pt reports good sleep without any current medication. Reports on going anxiety.   Pt provided with scheduled and as needed medications per providers orders. Pt's labs and vitals were monitored throughout the night. Pt given a 1:1 about emotional and mental status. Pt supported and encouraged to express concerns and questions. Pt educated on medications.  Pt's safety ensured with 15 minute and environmental checks. Pt currently denies SI/HI and A/V hallucinations. Pt verbally agrees to seek staff if SI/HI or A/VH occurs and to consult with staff before acting on any harmful thoughts. Will continue POC.

## 2016-09-20 NOTE — [facility] Group Notes (Signed)
[facility] 09/20/2016 1:15pm  Type of Therapy: Mental Health Association Presentation  Participation Level: DID NOT ATTEND/invited. Chose to remain in bed.   Summary of Progress/Problems: Mental Health Association [facility]) Speaker came to talk about his personal journey with mental health. The pt processed ways by which to relate to the speaker. MHA speaker provided handouts and educational information pertaining to groups and services offered [facility]. Pt was engaged in speaker's presentation and was receptive to resources provided.    Pulte HomesHeather N Smart, LCSW 09/20/2016 10:28 AM

## 2016-09-20 NOTE — Progress Notes (Signed)
[facility] referral faxed. Pt also on [facility] Waitlist.   Trula SladeHeather Smart, MSW, LCSW Clinical Social Worker 09/20/2016 8:38 AM

## 2016-09-20 NOTE — Progress Notes (Signed)
Nursing Note 09/20/2016 [id number]  Data Reports sleeping fair without PRN sleep med.  Rates depression 3/10, hopelessness 0/10, and anxiety 0/10. Affect depressed. Denies HI, SI, AVH.  Patient continues to spend most of time in the room, did come out for meals, and briefly in dayroom this AM.  Irritable in afternoon, started yelling group of peers in dayroom blaming, "One of y'all are telling people I'm in with the counselor when they call for me, at least have the common decency to come tell me in my room."  Redirected and behavior stopped.   Action Spoke with patient 1:1, nurse offered support to patient throughout shift.  Continues to be monitored on 15 minute checks for safety.  Response Somewhat irritable in afternoon, conflict in milieu, but none observed afterwards.

## 2016-09-20 NOTE — Progress Notes (Addendum)
[facility] MD Progress Note  09/20/2016 11:53 AM [name]  MRN:  [id number] Subjective:   56 y.o AAM, single, lives alone, unemployed. Background history of depression and chronic medical issues as above. Self presented to the ER seeking help. Relapsed on alcohol and substances in the past month. Has been overwhelmed by recent deaths in his family. Has been more depressed lately. Was having suicidal thoughts at home. BAL was 196 mg/dl. UDS was positive for cocaine.   Chart reviewed today. Patient discussed at team today.  Staff reports that he had been isolating self until this morning. He groomed self this morning and has been coming out more. He has repeatedly denied any abnormal perception. He denies any suicidal thoughts lately. Tells staff that he just needed a place to come off the drugs. Has been asking for discharge. Transient elevation in blood pressure likely related to cocaine.  SW has made referrals to [facility] and [facility].   Seen today. Was out at the dayroom interacting with peers. Tells me that he feels better now. Says he has no desire to kill himself. States that he has been dealing with addiction for many years and knows when to come in for help. Says that was what he did. Says he has a lot that is going on for him. He has other family relatives and his younger sister. Patient says he just spoke to his cousin. Says his cousin is being evicted. Patient states that he has some of his clothes at his place. He wanted to go get them today. Patient denies violent thoughts. Denies any homicidal thoughts. Denies any abnormal perception. No delusional preoccupation. Patient denies any craving for substances. I explored effects of substances on his mental health with him. Patient is in denial about the negative effects. Says it helps him cope. No motivation to stay sober. Risks especially on effects of substances with respect to suicide was discussed with him.  Principal Problem: MDD (major  depressive disorder), recurrent severe, without psychosis (HCC)                                   SIMD Diagnosis:   Patient Active Problem List   Diagnosis Date Noted  . Substance use disorder [F19.90] 09/19/2016  . MDD (major depressive disorder), recurrent severe, without psychosis (HCC) [F33.2] 09/18/2016  . 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 Human immunodeficiency virus (HIV) disease (HCC) [B20] 11/09/2008   Total Time spent with patient: 20 minutes  Past Psychiatric History: As in H&P  Past Medical History:  Past Medical History:  Diagnosis Date  . 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 Hemorrhoids   . HIV (human immunodeficiency virus infection) (HCC)     Past Surgical History:  Procedure Laterality Date  . HERNIA REPAIR  97   lt ing  . RECTAL EXAM UNDER ANESTHESIA N/A 04/24/2014   Procedure:  ligation of bleeding vessels;  Surgeon: Almond Lint, MD;  Location: [facility];  Service: General;  Laterality: N/A;  . TENDON REPAIR     2006-lt index  . WART FULGURATION  07/06/2011   Procedure: FULGURATION ANAL WART;  Surgeon: Shelly Rubenstein, MD;  Location: [facility];  Service: General;  Laterality: N/A;  excision anal condyloma  . WART FULGURATION N/A 04/23/2014   Procedure: EXCISION OF ANAL CONDYLOMA;  Surgeon: Abigail Miyamoto, MD;  Location: [facility];  Service: General;  Laterality: N/A;   Family History:  Family  History  Problem Relation Age of Onset  . Cancer Mother        brain  . Cancer Sister        breast   Family Psychiatric  History: As in H&P Social History:  History  Alcohol Use  . 0.0 oz/week    Comment: occasional beer      History  Drug Use No    Social History   Social History  . Marital status: Single    Spouse name: N/A  . Number of children: N/A  . Years of education: N/A   Social History Main Topics  . Smoking status: Current Every Day Smoker    Packs/day: 0.50    Years: 25.00    Types: Cigarettes  . Smokeless tobacco: Never Used   . Alcohol use 0.0 oz/week     Comment: occasional beer   . Drug use: No  . Sexual activity: No   Other Topics Concern  . None   Social History Narrative  . None   Additional Social History:      Sleep: Good  Appetite:  Good  Current Medications: Current Facility-Administered Medications  Medication Dose Route Frequency Provider Last Rate Last Dose  . acetaminophen (TYLENOL) tablet 650 mg  650 mg Oral Q6H PRN Okonkwo, Justina A, NP      . alum & mag hydroxide-simeth (MAALOX/MYLANTA) 200-200-20 MG/5ML suspension 30 mL  30 mL Oral Q4H PRN Okonkwo, Justina A, NP      . baclofen (LIORESAL) tablet 10 mg  10 mg Oral TID PRN Kerry HoughSimon, Spencer E, PA-C      . buPROPion (WELLBUTRIN XL) 24 hr tablet 300 mg  300 mg Oral BH-q7a Simon, Spencer E, PA-C   300 mg at 09/20/16 [id number]  . chlordiazePOXIDE (LIBRIUM) capsule 25 mg  25 mg Oral Q6H PRN Kerry HoughSimon, Spencer E, PA-C      . chlordiazePOXIDE (LIBRIUM) capsule 25 mg  25 mg Oral TID Donell SievertSimon, Spencer E, PA-C   25 mg at 09/20/16 0933   Followed by  . [START ON 09/21/2016] chlordiazePOXIDE (LIBRIUM) capsule 25 mg  25 mg Oral BH-qamhs Simon, Spencer E, PA-C       Followed by  . [START ON 09/22/2016] chlordiazePOXIDE (LIBRIUM) capsule 25 mg  25 mg Oral Daily Simon, Mena GoesSpencer E, PA-C      . darunavir-cobicistat (PREZCOBIX) 800-150 MG per tablet 1 tablet  1 tablet Oral Daily Kerry HoughSimon, Spencer E, PA-C   1 tablet at 09/20/16 0933  . emtricitabine-tenofovir AF (DESCOVY) 200-25 MG per tablet 1 tablet  1 tablet Oral Daily Kerry HoughSimon, Spencer E, PA-C   1 tablet at 09/20/16 0933  . hydrOXYzine (ATARAX/VISTARIL) tablet 25 mg  25 mg Oral Q6H PRN Kerry HoughSimon, Spencer E, PA-C   25 mg at 09/19/16 2126  . ibuprofen (ADVIL,MOTRIN) tablet 800 mg  800 mg Oral Q8H PRN Kerry HoughSimon, Spencer E, PA-C   800 mg at 09/19/16 2126  . [START ON 09/21/2016] Influenza vac split quadrivalent PF (FLUARIX) injection 0.5 mL  0.5 mL Intramuscular Tomorrow-1000 Laurieanne Galloway A, MD      . loperamide (IMODIUM) capsule  2-4 mg  2-4 mg Oral PRN Donell SievertSimon, Spencer E, PA-C      . magnesium hydroxide (MILK OF MAGNESIA) suspension 30 mL  30 mL Oral Daily PRN Okonkwo, Justina A, NP      . multivitamin with minerals tablet 1 tablet  1 tablet Oral Daily Kerry HoughSimon, Spencer E, PA-C   Stopped at 09/20/16 0933  . ondansetron (ZOFRAN-ODT) disintegrating  tablet 4 mg  4 mg Oral Q6H PRN Kerry Hough, PA-C   4 mg at 09/20/16 1610  . thiamine (B-1) injection 100 mg  100 mg Intramuscular Once Donell Sievert E, PA-C      . thiamine (VITAMIN B-1) tablet 100 mg  100 mg Oral Daily Kerry Hough, PA-C   Stopped at 09/20/16 9604  . traZODone (DESYREL) tablet 50 mg  50 mg Oral QHS PRN Okonkwo, Justina A, NP        Lab Results: No results found for this or any previous visit (from the past 48 hour(s)).  Blood Alcohol level:  Lab Results  Component Value Date   ETH 196 (H) 09/18/2016   ETH (H) 03/14/2010    224        LOWEST DETECTABLE LIMIT FOR SERUM ALCOHOL IS 5 mg/dL FOR MEDICAL PURPOSES ONLY    Metabolic Disorder Labs: No results found for: HGBA1C, MPG No results found for: PROLACTIN Lab Results  Component Value Date   CHOL 207 (H) 02/26/2015   TRIG 154 (H) 02/26/2015   HDL 97 02/26/2015   CHOLHDL 2.1 02/26/2015   VLDL 31 (H) 02/26/2015   LDLCALC 79 02/26/2015   LDLCALC 78 04/16/2013    Physical Findings: AIMS: Facial and Oral Movements Muscles of Facial Expression: None, normal Lips and Perioral Area: None, normal Jaw: None, normal Tongue: None, normal,Extremity Movements Upper (arms, wrists, hands, fingers): None, normal Lower (legs, knees, ankles, toes): None, normal, Trunk Movements Neck, shoulders, hips: None, normal, Overall Severity Severity of abnormal movements (highest score from questions above): None, normal Incapacitation due to abnormal movements: None, normal Patient's awareness of abnormal movements (rate only patient's report): No Awareness, Dental Status Current problems with teeth and/or  dentures?: No Does patient usually wear dentures?: No  CIWA:  CIWA-Ar Total: 10 COWS:     Musculoskeletal: Strength & Muscle Tone: within normal limits Gait & Station: normal Patient leans: N/A  Psychiatric Specialty Exam: Physical Exam  Constitutional: He appears well-developed and well-nourished.  Respiratory: Effort normal.  Neurological: He is alert.  Psychiatric:  As above    ROS  Blood pressure 132/82, pulse 68, temperature (!) 97.5 F (36.4 C), temperature source Oral, resp. rate 16, height 6' (1.829 m), weight 74.2 kg (163 lb 8 oz).Body mass index is 22.17 kg/m.  General Appearance: Neatly dressed, calm and cooperative. Good relatedness. Appropriate behavior. No tremors, not sweaty. Not internally distracted.   Eye Contact:  Good  Speech:  Clear and Coherent and Normal Rate  Volume:  Normal  Mood:  Euthymic  Affect:  Appropriate and Full Range  Thought Process:  Goal Directed and Linear  Orientation:  Full (Time, Place, and Person)  Thought Content:  Future oriented. No negative rumination. No hallucination in any modality.   Suicidal Thoughts:  No  Homicidal Thoughts:  No  Memory:  Immediate;   Good Recent;   Good Remote;   Good  Judgement:  Good  Insight:  Fair  Psychomotor Activity:  Normal  Concentration:  Concentration: Good and Attention Span: Good  Recall:  Good  Fund of Knowledge:  Good  Language:  Good  Akathisia:  Negative  Handed:    AIMS (if indicated):     Assets:  Communication Skills Desire for Improvement Financial Resources/Insurance Housing Resilience Social Support Transportation  ADL's:  Intact  Cognition:  WNL  Sleep:  Number of Hours: 6.5     Treatment Plan Summary: Effects of substances seems to have worn off. His  mood has dramatically improved. Patient is no longer expressing any suicidal thoughts. He is engaging more. Patient has limited insight on the role of substances in his mental health. He does not seem motivated to  engage in any relapse preventive measure at this time. We plan to evaluate him overnight. Hopeful discharge tomorrow if he stay stable.   Psychiatric: MDD Recurrent SUD  Medical: HIV  Psychosocial:  Bereavement Chronic medical illness Social isolation Limited support   PLAN: 1. Continue current regimen 2. Continue to monitor mood, behavior and interaction with peers 3. Encourage unit groups and activities  Georgiann Cocker, MD 09/20/2016, 11:53 AM

## 2016-09-20 NOTE — Progress Notes (Signed)
D: Pt observed in room resting with eyes closed. Pt at the time of assessment endorsed moderate anxiety and depression. Pt also complained of generalized body pain-see MAR. Pt denied SI, HI or AVH; "I am feeling better; I need to catchup on sleep"  A: Medications including sleep med ordered and offered as prescribed. Pt given the opportunity to ask questions and state concerns. Support, encouragement, and safe environment provided. R: Pt was med compliant. All patient's questions and concerns addressed. 15-minute safety checks continue. Safety checks continue. Pt attended AA meeting.

## 2016-09-21 MED ORDER — BUPROPION HCL ER (XL) 300 MG PO TB24: 300.0000 mg | ORAL_TABLET | ORAL | 0 refills | Status: DC

## 2016-09-21 MED ORDER — TRAZODONE HCL 50 MG PO TABS: 50.0000 mg | ORAL_TABLET | Freq: Every evening | ORAL | 0 refills | Status: DC | PRN

## 2016-09-21 MED ORDER — IBUPROFEN 800 MG PO TABS: 800.0000 mg | ORAL_TABLET | Freq: Three times a day (TID) | ORAL | 0 refills | Status: DC | PRN

## 2016-09-21 MED ORDER — DARUNAVIR-COBICISTAT 800-150 MG PO TABS: 1.0000 | ORAL_TABLET | Freq: Every day | ORAL | 0 refills | Status: DC

## 2016-09-21 MED ORDER — EMTRICITABINE-TENOFOVIR AF 200-25 MG PO TABS: 1.0000 | ORAL_TABLET | Freq: Every day | ORAL | 0 refills | Status: DC

## 2016-09-21 MED ORDER — BACLOFEN 10 MG PO TABS: 10.0000 mg | ORAL_TABLET | Freq: Three times a day (TID) | ORAL | 0 refills | Status: DC | PRN

## 2016-09-21 NOTE — Progress Notes (Signed)
  [facility] :  Will you be returning to the same living situation after discharge:  Yes,  pt returning home. At discharge, do you have transportation home?: Yes,  pt has access to transportation. Do you have the ability to pay for your medications: Yes,  pt has insurance.  Release of information consent forms completed and in the chart;  Patient's signature needed at discharge.  Patient to Follow up at: Follow-up Information    [facility] Follow up.   Specialty:  Behavioral Health Why:  Walk in within 7 days of hospital/rehab discharge to be assessed for outpatient mental health services including: medication management and counseling. Walk in hours: Mon-Fri 8am-9am. Please bring photo ID and medicaid/medicare cards. Thank you.  Contact informationElpidio Eric: [address] [place] [address] [phone]        [facility] Follow up.   Specialty:  Addiction Medicine Why:  Referral faxed: 09/20/16. Please continue to call and check for bed availability with Shayla. Contact information: [address] [place] [address] [phone]        [facility] Follow up.   Why:  You have been placed on waitlist as of 09/20/16. Facility will call when a bed becomes available. They require a 30 day supply of medications and proof of [place] residency/[place]icaid card. Thank you.  Contact information: [address] [place] [address] [phone]           Next level of care provider has [facility] Link:no  Safety Planning and Suicide Prevention discussed: Yes,  with pt.  Have you used any form of tobacco in the last 30 days? (Cigarettes, Smokeless Tobacco, Cigars, and/or Pipes): Yes  Has patient been referred to the Quitline?: Patient refused referral  Patient has been referred for addiction treatment: Yes  Jonathon JordanLynn B Gregorio Worley, MSW, LCSWA 09/21/2016, 9:39 AM

## 2016-09-21 NOTE — Progress Notes (Signed)
Patient ID: [name], male   DOB: 03-21-1960, 56 y.o.   MRN: [id number] Patient discharged to home/selfcare.  Patient denies SI, HI and AVH upon discharge and states that he desires for long term treatment for alcohol rehab and he would pursue it on an outpatient status.  Patient acknowledged understanding of all discharge instructions.

## 2016-09-21 NOTE — Plan of Care (Signed)
Problem: Coping: Goal: Ability to verbalize feelings will improve Outcome: Progressing Pt verbalizes feelings of anxiety surrounding discharge plans 09/21/16
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
## 2016-09-24 ENCOUNTER — Emergency Department (HOSPITAL_COMMUNITY)
Admission: EM | Admit: 2016-09-24 | Discharge: 2016-09-25 | Disposition: A | Discharge status: Home / Self Care | Payer: Medicare Other | Attending: Emergency Medicine | Admitting: Emergency Medicine

## 2016-09-24 ENCOUNTER — Encounter (HOSPITAL_COMMUNITY): Payer: Self-pay | Admitting: Emergency Medicine

## 2016-09-24 ENCOUNTER — Ambulatory Visit (HOSPITAL_COMMUNITY)
Admission: AD | Admit: 2016-09-24 | Discharge: 2016-09-24 | Disposition: A | Discharge status: Home / Self Care | Payer: Medicare Other | Source: Intra-hospital | Attending: Psychiatry | Admitting: Psychiatry

## 2016-09-24 ENCOUNTER — Emergency Department (HOSPITAL_COMMUNITY): Payer: Medicare Other

## 2016-09-24 DIAGNOSIS — R0602 Shortness of breath: Secondary | ICD-10-CM | POA: Diagnosis not present

## 2016-09-24 DIAGNOSIS — B20 Human immunodeficiency virus [HIV] disease: Secondary | ICD-10-CM | POA: Diagnosis not present

## 2016-09-24 DIAGNOSIS — R45851 Suicidal ideations: Secondary | ICD-10-CM | POA: Diagnosis not present

## 2016-09-24 DIAGNOSIS — Z79899 Other long term (current) drug therapy: Secondary | ICD-10-CM | POA: Diagnosis not present

## 2016-09-24 DIAGNOSIS — Z046 Encounter for general psychiatric examination, requested by authority: Secondary | ICD-10-CM | POA: Diagnosis not present

## 2016-09-24 DIAGNOSIS — F1014 Alcohol abuse with alcohol-induced mood disorder: Secondary | ICD-10-CM | POA: Insufficient documentation

## 2016-09-24 DIAGNOSIS — F1010 Alcohol abuse, uncomplicated: Secondary | ICD-10-CM | POA: Diagnosis not present

## 2016-09-24 DIAGNOSIS — F1920 Other psychoactive substance dependence, uncomplicated: Secondary | ICD-10-CM | POA: Diagnosis not present

## 2016-09-24 DIAGNOSIS — F329 Major depressive disorder, single episode, unspecified: Secondary | ICD-10-CM | POA: Diagnosis present

## 2016-09-24 DIAGNOSIS — F17210 Nicotine dependence, cigarettes, uncomplicated: Secondary | ICD-10-CM | POA: Insufficient documentation

## 2016-09-24 LAB — CBC WITH DIFFERENTIAL/PLATELET
BASOS ABS: 0 10*3/uL (ref 0.0–0.1)
BASOS PCT: 0 %
EOS ABS: 0.1 10*3/uL (ref 0.0–0.7)
EOS PCT: 2 %
HCT: 40 % (ref 39.0–52.0)
Hemoglobin: 13.6 g/dL (ref 13.0–17.0)
Lymphocytes Relative: 48 %
Lymphs Abs: 1.8 10*3/uL (ref 0.7–4.0)
MCH: 28.4 pg (ref 26.0–34.0)
MCHC: 34 g/dL (ref 30.0–36.0)
MCV: 83.5 fL (ref 78.0–100.0)
MONO ABS: 0.3 10*3/uL (ref 0.1–1.0)
Monocytes Relative: 7 %
Neutro Abs: 1.6 10*3/uL — ABNORMAL LOW (ref 1.7–7.7)
Neutrophils Relative %: 43 %
PLATELETS: 250 10*3/uL (ref 150–400)
RBC: 4.79 MIL/uL (ref 4.22–5.81)
RDW: 14.7 % (ref 11.5–15.5)
WBC: 3.7 10*3/uL — ABNORMAL LOW (ref 4.0–10.5)

## 2016-09-24 LAB — RAPID URINE DRUG SCREEN, HOSP PERFORMED
Amphetamines: NOT DETECTED
BARBITURATES: NOT DETECTED
Benzodiazepines: POSITIVE — AB
Cocaine: POSITIVE — AB
Opiates: NOT DETECTED
Tetrahydrocannabinol: NOT DETECTED

## 2016-09-24 LAB — I-STAT CG4 LACTIC ACID, ED: Lactic Acid, Venous: 1.83 mmol/L (ref 0.5–1.9)

## 2016-09-24 MED ORDER — IOPAMIDOL (ISOVUE-300) INJECTION 61%
INTRAVENOUS | Status: AC
  Administered 2016-09-25: 75 mL via INTRAVENOUS
  Filled 2016-09-24: qty 75

## 2016-09-24 NOTE — ED Notes (Signed)
Bed: UJ81 Expected date:  Expected time:  Means of arrival:  Comments: 56 yo M/[facility] pt n/v/etoh

## 2016-09-24 NOTE — ED Provider Notes (Signed)
  Face-to-face evaluation   History: He presents for evaluation of depression with suicidal ideation, and suicidal ideation and plan to overdose on drugs.  This is a recurrent problem.  Physical exam: Alert, calm, anxious.  No respiratory distress.  Moves arms and legs equally.  Medical screening examination/treatment/procedure(s) were conducted as a shared visit with non-physician practitioner(s) and myself.  I personally evaluated the patient during the encounter    Mancel Bale, MD 09/27/16 [phone]

## 2016-09-24 NOTE — ED Provider Notes (Signed)
[facility]-EMERGENCY DEPT Provider Note   CSN: [id number] Arrival date & time: 09/24/16  2217     History   Chief Complaint No chief complaint on file.   HPI   Blood pressure (!) 151/97, pulse 71, temperature (!) 97.4 F (36.3 C), temperature source Oral, resp. rate 16, SpO2 100 %.  [name] is a 56 y.o. male with past medical history significant for HIV (last CD4 count 370 on 06/16/2016) alcohol abuse with alcohol withdrawal seizures complaining of suicidal ideation with a plan to overdose. Patient had prior suicide attempt: He hung himself and overdosed in the past. He states that he plans to overdose on Wellbutrin and oxycodone today. He denies any attempt. He denies auditory or visual hallucinations, homicidal ideation. He last drank this afternoon he had 24 beers. He states that he drinks daily. He would like help with detox. On review of systems he notes a tactile fever with headache and shortness of breath with no chest pain. He also denies cough, increasing peripheral edema.  Past Medical History:  Diagnosis Date  . 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 Hemorrhoids   . HIV (human immunodeficiency virus infection) [facility])     Patient Active Problem List   Diagnosis Date Noted  . Substance use disorder 09/19/2016  . 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 Human immunodeficiency virus (HIV) disease (HCC) 11/09/2008    Past Surgical History:  Procedure Laterality Date  . HERNIA REPAIR  97   lt ing  . RECTAL EXAM UNDER ANESTHESIA N/A 04/24/2014   Procedure:  ligation of bleeding vessels;  Surgeon: Almond Lint, MD;  Location: [facility];  Service: General;  Laterality: N/A;  . TENDON REPAIR     2006-lt index  . WART FULGURATION  07/06/2011   Procedure: FULGURATION ANAL WART;  Surgeon: Shelly Rubenstein, MD;  Location: [facility];  Service: General;  Laterality: N/A;  excision anal condyloma  .  WART FULGURATION N/A 04/23/2014   Procedure: EXCISION OF ANAL CONDYLOMA;  Surgeon: Abigail Miyamoto, MD;  Location: [facility];  Service: General;  Laterality: N/A;       Home Medications    Prior to Admission medications   Medication Sig Start Date End Date Taking? Authorizing Provider  baclofen (LIORESAL) 10 MG tablet Take 1 tablet (10 mg total) by mouth 3 (three) times daily as needed for muscle spasms. 09/21/16  Yes Armandina Stammer I, NP  buPROPion (WELLBUTRIN XL) 300 MG 24 hr tablet Take 1 tablet (300 mg total) by mouth every morning. For depression 09/21/16  Yes Nwoko,  Kindred I, NP  darunavir-cobicistat (PREZCOBIX) 800-150 MG tablet Take 1 tablet by mouth daily. Swallow whole. Do NOT crush, break or chew tablets. Take with food: For HIV infection 09/21/16  Yes Nwoko,  Kindred I, NP  emtricitabine-tenofovir AF (DESCOVY) 200-25 MG tablet Take 1 tablet by mouth daily. For HIV infection 09/21/16  Yes Nwoko,  Kindred I, NP  ibuprofen (ADVIL,MOTRIN) 800 MG tablet Take 1 tablet (800 mg total) by mouth every 8 (eight) hours as needed for headache, mild pain or moderate pain. 09/21/16  Yes Armandina Stammer I, NP  traZODone (DESYREL) 50 MG tablet Take 1 tablet (50 mg total) by mouth at bedtime as needed for sleep. 09/21/16  Yes Sanjuana Kava, NP    Family History Family History  Problem Relation Age of Onset  . Cancer Mother        brain  . Cancer Sister  breast    Social History Social History  Substance Use Topics  . Smoking status: Current Every Day Smoker    Packs/day: 0.50    Years: 25.00    Types: Cigarettes  . Smokeless tobacco: Never Used  . Alcohol use 0.0 oz/week     Comment: occasional beer      Allergies   Shellfish allergy and Sulfa antibiotics   Review of Systems Review of Systems  A complete review of systems was obtained and all systems are negative except as noted in the HPI and PMH.    Physical Exam Updated Vital Signs BP (!) 151/97 (BP Location: Left Arm)   Pulse 71    Temp (!) 97.4 F (36.3 C) (Oral)   Resp 16   SpO2 100%   Physical Exam  Constitutional: He is oriented to person, place, and time. He appears well-developed and well-nourished. No distress.  HENT:  Head: Normocephalic and atraumatic.  Mouth/Throat: Oropharynx is clear and moist.  Eyes: Pupils are equal, round, and reactive to light. Conjunctivae and EOM are normal.  No TTP of maxillary or frontal sinuses  No TTP or induration of temporal arteries bilaterally  Neck: Normal range of motion. Neck supple. No JVD present. No tracheal deviation present.  3 x 7 cm nontender lesion to right lateral neck consistent with lipoma  FROM to C-spine. Pt can touch chin to chest without discomfort. No TTP of midline cervical spine.   Cardiovascular: Normal rate, regular rhythm and intact distal pulses.   Radial pulse equal bilaterally  Pulmonary/Chest: Effort normal and breath sounds normal. No stridor. No respiratory distress. He has no wheezes. He has no rales. He exhibits no tenderness.  Abdominal: Soft. Bowel sounds are normal. He exhibits no distension and no mass. There is no tenderness. There is no rebound and no guarding.  Musculoskeletal: Normal range of motion. He exhibits no edema or tenderness.  No calf asymmetry, superficial collaterals, palpable cords, edema, Homans sign negative bilaterally.    Neurological: He is alert and oriented to person, place, and time. No cranial nerve deficit.  II-Visual fields grossly intact. III/IV/VI-Extraocular movements intact.  Pupils reactive bilaterally. V/VII-Smile symmetric, equal eyebrow raise,  facial sensation intact VIII- Hearing grossly intact IX/X-Normal gag XI-bilateral shoulder shrug XII-midline tongue extension Motor: 5/5 bilaterally with normal tone and bulk Cerebellar: Normal finger-to-nose  and normal heel-to-shin test.   Romberg negative Ambulates with a coordinated gait   Skin: Skin is warm. He is not diaphoretic.    Psychiatric: He has a normal mood and affect.  Nursing note and vitals reviewed.    ED Treatments / Results  Labs (all labs ordered are listed, but only abnormal results are displayed) Labs Reviewed  CBC WITH DIFFERENTIAL/PLATELET - Abnormal; Notable for the following:       Result Value   WBC 3.7 (*)    Neutro Abs 1.6 (*)    All other components within normal limits  COMPREHENSIVE METABOLIC PANEL - Abnormal; Notable for the following:    AST 46 (*)    All other components within normal limits  SALICYLATE LEVEL - Abnormal; Notable for the following:    Salicylate Lvl <0.7 (*)    All other components within normal limits  ACETAMINOPHEN LEVEL - Abnormal; Notable for the following:    Acetaminophen (Tylenol), Serum <10 (*)    All other components within normal limits  ETHANOL - Abnormal; Notable for the following:    Alcohol, Ethyl (B) 157 (*)    All other  components within normal limits  RAPID URINE DRUG SCREEN, HOSP PERFORMED - Abnormal; Notable for the following:    Cocaine POSITIVE (*)    Benzodiazepines POSITIVE (*)    All other components within normal limits  CULTURE, BLOOD (ROUTINE X 2)  CULTURE, BLOOD (ROUTINE X 2)  TROPONIN I  BRAIN NATRIURETIC PEPTIDE  I-STAT CG4 LACTIC ACID, ED    EKG  EKG Interpretation None       Radiology Dg Chest 2 View  Result Date: 09/24/2016 CLINICAL DATA:  Dyspnea EXAM: CHEST  2 VIEW COMPARISON:  01/16/2010 FINDINGS: The heart size and mediastinal contours are within normal limits. Both lungs are clear. Mild elevation of the left hemidiaphragm. Right subcoracoid lamellated loose body. 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 IMPRESSION: 1. No active cardiopulmonary disease. 2. Chronic subcoracoid loose body about the right shoulder. Electronically Signed   By: Tollie Eth M.D.   On: 09/24/2016 23:07    Procedures Procedures (including critical care time)  Medications Ordered in ED Medications  iopamidol (ISOVUE-300) 61 %  injection (not administered)  baclofen (LIORESAL) tablet 10 mg (not administered)  buPROPion (WELLBUTRIN XL) 24 hr tablet 300 mg (not administered)  darunavir-cobicistat (PREZCOBIX) 800-150 MG per tablet 1 tablet (not administered)  emtricitabine-tenofovir AF (DESCOVY) 200-25 MG per tablet 1 tablet (not administered)  ibuprofen (ADVIL,MOTRIN) tablet 800 mg (not administered)  traZODone (DESYREL) tablet 50 mg (not administered)  LORazepam (ATIVAN) injection 0-4 mg (not administered)    Or  LORazepam (ATIVAN) tablet 0-4 mg (not administered)  LORazepam (ATIVAN) injection 0-4 mg (not administered)    Or  LORazepam (ATIVAN) tablet 0-4 mg (not administered)  thiamine (VITAMIN B-1) tablet 100 mg (not administered)    Or  thiamine (B-1) injection 100 mg (not administered)  ibuprofen (ADVIL,MOTRIN) tablet 600 mg (not administered)  zolpidem (AMBIEN) tablet 5 mg (not administered)  ondansetron (ZOFRAN) tablet 4 mg (not administered)  alum & mag hydroxide-simeth (MAALOX/MYLANTA) 200-200-20 MG/5ML suspension 30 mL (not administered)  nicotine (NICODERM CQ - dosed in mg/24 hours) patch 21 mg (not administered)  iopamidol (ISOVUE-300) 61 % injection 75 mL (not administered)     Initial Impression / Assessment and Plan / ED Course  I have reviewed the triage vital signs and the nursing notes.  Pertinent labs & imaging results that were available during my care of the patient were reviewed by me and considered in my medical decision making (see chart for details).     Vitals:   09/24/16 2226 09/24/16 2236  BP: (!) 151/97   Pulse: 71   Resp: 16   Temp: (!) 97.4 F (36.3 C)   TempSrc: Oral   SpO2: 100% 100%    Medications  iopamidol (ISOVUE-300) 61 % injection (not administered)  baclofen (LIORESAL) tablet 10 mg (not administered)  buPROPion (WELLBUTRIN XL) 24 hr tablet 300 mg (not administered)  darunavir-cobicistat (PREZCOBIX) 800-150 MG per tablet 1 tablet (not administered)    emtricitabine-tenofovir AF (DESCOVY) 200-25 MG per tablet 1 tablet (not administered)  ibuprofen (ADVIL,MOTRIN) tablet 800 mg (not administered)  traZODone (DESYREL) tablet 50 mg (not administered)  LORazepam (ATIVAN) injection 0-4 mg (not administered)    Or  LORazepam (ATIVAN) tablet 0-4 mg (not administered)  LORazepam (ATIVAN) injection 0-4 mg (not administered)    Or  LORazepam (ATIVAN) tablet 0-4 mg (not administered)  thiamine (VITAMIN B-1) tablet 100 mg (not administered)    Or  thiamine (B-1) injection 100 mg (not administered)  ibuprofen (ADVIL,MOTRIN) tablet 600 mg (not administered)  zolpidem (AMBIEN) tablet 5 mg (not administered)  ondansetron (ZOFRAN) tablet 4 mg (not administered)  alum & mag hydroxide-simeth (MAALOX/MYLANTA) 200-200-20 MG/5ML suspension 30 mL (not administered)  nicotine (NICODERM CQ - dosed in mg/24 hours) patch 21 mg (not administered)  iopamidol (ISOVUE-300) 61 % injection 75 mL (not administered)    [name] is 56 y.o. male presenting with Alcohol withdrawal and depression with suicidal ideation and intent to kill himself by overdose. This patient has a low CD4 count of 300. Nonfocal neurologic exam however patient does report headache and fever, given that will obtain CT with and without contrast. Patient is reporting shortness of breath with no chest pain. No tachypnea, tachycardia, patient saturating well on room air, lung sounds are clear. No peripheral edema. EKG with no acute findings. Troponin proBNP are negative, chest x-rays without infiltrate.  Pending head CT with and without contrast case signed out to PA Leapheart at shift change: CT negative will consult TTS and place moved to psych ED orders.  Final Clinical Impressions(s) / ED Diagnoses   Final diagnoses:  Suicidal ideation  Alcohol abuse    New Prescriptions New Prescriptions   No medications on file     Kaylyn Lim 09/25/16 Jesse Fall,  MD 09/27/16 [phone]

## 2016-09-24 NOTE — H&P (Signed)
Behavioral Health Medical Screening Exam  [name] is an 56 y.o. male.  Total Time spent with patient: 15 minutes  Psychiatric Specialty Exam: Physical Exam  Constitutional: He is oriented to person, place, and time. He appears well-developed and well-nourished. No distress.  HENT:  Head: Normocephalic and atraumatic.  Right Ear: External ear normal.  Left Ear: External ear normal.  Eyes: Conjunctivae are normal. Right eye exhibits no discharge. Left eye exhibits no discharge. No scleral icterus.  Cardiovascular: Normal rate, regular rhythm and normal heart sounds.   Respiratory: Effort normal and breath sounds normal. No respiratory distress.  Musculoskeletal:       Right knee: He exhibits decreased range of motion.  Neurological: He is alert and oriented to person, place, and time.  Skin: Skin is warm and dry. He is not diaphoretic.  Psychiatric: His mood appears anxious. Thought content is not paranoid and not delusional. Cognition and memory are normal. He expresses impulsivity and inappropriate judgment. He exhibits a depressed mood. He expresses suicidal ideation. He expresses no homicidal ideation. He expresses suicidal plans.    Review of Systems  Respiratory: Positive for shortness of breath. Negative for cough and wheezing.   Cardiovascular: Negative for chest pain and palpitations.  Gastrointestinal: Positive for diarrhea. Negative for nausea and vomiting.  Musculoskeletal: Positive for joint pain.  Neurological: Positive for weakness.  Psychiatric/Behavioral: Positive for depression, substance abuse and suicidal ideas. Negative for hallucinations and memory loss. The patient is nervous/anxious and has insomnia.   All other systems reviewed and are negative.   Blood pressure (!) 158/88, pulse 71, temperature 98.6 F (37 C), resp. rate 16, SpO2 100 %.There is no height or weight on file to calculate BMI.  General Appearance: Disheveled  Eye Contact:  Good  Speech:   Clear and Coherent and Normal Rate  Volume:  Normal  Mood:  Depressed, Hopeless, Irritable and Worthless  Affect:  Congruent and Depressed  Thought Process:  Coherent and Goal Directed  Orientation:  Full (Time, Place, and Person)  Thought Content:  Logical and Hallucinations: None  Suicidal Thoughts:  Yes.  with intent/plan  Homicidal Thoughts:  No  Memory:  Immediate;   Good Recent;   Good  Judgement:  Intact  Insight:  Fair  Psychomotor Activity:  Normal  Concentration: Concentration: Good and Attention Span: Good  Recall:  Good  Fund of Knowledge:Good  Language: Good  Akathisia:  No  Handed:  Right  AIMS (if indicated):     Assets:  Communication Skills Desire for Improvement Resilience  Sleep:       Musculoskeletal: Strength & Muscle Tone: within normal limits Gait & Station: unsteady   Blood pressure (!) 158/88, pulse 71, temperature 98.6 F (37 C), resp. rate 16, SpO2 100 %.  Recommendations:  Based on my evaluation the patient does not appear to have an emergency medical condition.Patient had multiple episodes of diarrhea soon after arriving [facility].  Reports diarrhea for several days. Denies any n/v. Will send to ED.   Jackelyn Poling, NP 09/24/2016, 10:52 PM

## 2016-09-24 NOTE — ED Triage Notes (Signed)
Pt seeking help with depression and ETOH and drug abuse. Thoughts of hurting himself.

## 2016-09-25 ENCOUNTER — Encounter (HOSPITAL_COMMUNITY): Payer: Self-pay

## 2016-09-25 ENCOUNTER — Emergency Department (HOSPITAL_COMMUNITY): Payer: Medicare Other

## 2016-09-25 DIAGNOSIS — F1014 Alcohol abuse with alcohol-induced mood disorder: Secondary | ICD-10-CM

## 2016-09-25 DIAGNOSIS — F17210 Nicotine dependence, cigarettes, uncomplicated: Secondary | ICD-10-CM

## 2016-09-25 DIAGNOSIS — F1920 Other psychoactive substance dependence, uncomplicated: Secondary | ICD-10-CM

## 2016-09-25 LAB — COMPREHENSIVE METABOLIC PANEL
ALK PHOS: 39 U/L (ref 38–126)
ALT: 33 U/L (ref 17–63)
AST: 46 U/L — ABNORMAL HIGH (ref 15–41)
Albumin: 4.5 g/dL (ref 3.5–5.0)
Anion gap: 11 (ref 5–15)
BILIRUBIN TOTAL: 0.7 mg/dL (ref 0.3–1.2)
BUN: 17 mg/dL (ref 6–20)
CALCIUM: 9.1 mg/dL (ref 8.9–10.3)
CO2: 22 mmol/L (ref 22–32)
CREATININE: 1.01 mg/dL (ref 0.61–1.24)
Chloride: 104 mmol/L (ref 101–111)
Glucose, Bld: 78 mg/dL (ref 65–99)
Potassium: 3.6 mmol/L (ref 3.5–5.1)
Sodium: 137 mmol/L (ref 135–145)
Total Protein: 7.7 g/dL (ref 6.5–8.1)

## 2016-09-25 LAB — BRAIN NATRIURETIC PEPTIDE: B Natriuretic Peptide: 28.5 pg/mL (ref 0.0–100.0)

## 2016-09-25 LAB — TROPONIN I

## 2016-09-25 LAB — ETHANOL: Alcohol, Ethyl (B): 157 mg/dL — ABNORMAL HIGH (ref <5)

## 2016-09-25 LAB — ACETAMINOPHEN LEVEL

## 2016-09-25 LAB — SALICYLATE LEVEL

## 2016-09-25 MED ORDER — EMTRICITABINE-TENOFOVIR AF 200-25 MG PO TABS
1.0000 | ORAL_TABLET | Freq: Every day | ORAL | Status: DC
  Administered 2016-09-25: 1 via ORAL
  Filled 2016-09-25: qty 1

## 2016-09-25 MED ORDER — DARUNAVIR-COBICISTAT 800-150 MG PO TABS
1.0000 | ORAL_TABLET | Freq: Every day | ORAL | Status: DC
  Administered 2016-09-25: 1 via ORAL
  Filled 2016-09-25: qty 1

## 2016-09-25 MED ORDER — LORAZEPAM 2 MG/ML IJ SOLN: 0.0000 mg | Freq: Two times a day (BID) | INTRAMUSCULAR | Status: DC

## 2016-09-25 MED ORDER — LORAZEPAM 1 MG PO TABS
0.0000 mg | ORAL_TABLET | Freq: Four times a day (QID) | ORAL | Status: DC
  Filled 2016-09-25: qty 1

## 2016-09-25 MED ORDER — BUPROPION HCL ER (XL) 150 MG PO TB24
300.0000 mg | ORAL_TABLET | ORAL | Status: DC
  Administered 2016-09-25: 300 mg via ORAL
  Filled 2016-09-25: qty 2

## 2016-09-25 MED ORDER — TRAZODONE HCL 50 MG PO TABS: 50.0000 mg | ORAL_TABLET | Freq: Every evening | ORAL | Status: DC | PRN

## 2016-09-25 MED ORDER — ZOLPIDEM TARTRATE 5 MG PO TABS: 5.0000 mg | ORAL_TABLET | Freq: Every evening | ORAL | Status: DC | PRN

## 2016-09-25 MED ORDER — IBUPROFEN 800 MG PO TABS: 800.0000 mg | ORAL_TABLET | Freq: Three times a day (TID) | ORAL | Status: DC | PRN

## 2016-09-25 MED ORDER — LORAZEPAM 1 MG PO TABS
0.0000 mg | ORAL_TABLET | Freq: Four times a day (QID) | ORAL | Status: DC
  Administered 2016-09-25: 1 mg via ORAL

## 2016-09-25 MED ORDER — THIAMINE HCL 100 MG/ML IJ SOLN: 100.0000 mg | Freq: Every day | INTRAMUSCULAR | Status: DC

## 2016-09-25 MED ORDER — IOPAMIDOL (ISOVUE-300) INJECTION 61%
75.0000 mL | Freq: Once | INTRAVENOUS | Status: AC | PRN
  Administered 2016-09-25: 75 mL via INTRAVENOUS

## 2016-09-25 MED ORDER — LORAZEPAM 2 MG/ML IJ SOLN: 0.0000 mg | Freq: Four times a day (QID) | INTRAMUSCULAR | Status: DC

## 2016-09-25 MED ORDER — BACLOFEN 10 MG PO TABS: 10.0000 mg | ORAL_TABLET | Freq: Three times a day (TID) | ORAL | Status: DC | PRN

## 2016-09-25 MED ORDER — VITAMIN B-1 100 MG PO TABS
100.0000 mg | ORAL_TABLET | Freq: Every day | ORAL | Status: DC
  Administered 2016-09-25: 100 mg via ORAL

## 2016-09-25 MED ORDER — LORAZEPAM 1 MG PO TABS: 0.0000 mg | ORAL_TABLET | Freq: Two times a day (BID) | ORAL | Status: DC

## 2016-09-25 MED ORDER — NICOTINE 21 MG/24HR TD PT24
21.0000 mg | MEDICATED_PATCH | Freq: Every day | TRANSDERMAL | Status: DC
  Filled 2016-09-25: qty 1

## 2016-09-25 MED ORDER — VITAMIN B-1 100 MG PO TABS
100.0000 mg | ORAL_TABLET | Freq: Every day | ORAL | Status: DC
  Filled 2016-09-25: qty 1

## 2016-09-25 MED ORDER — IBUPROFEN 200 MG PO TABS: 600.0000 mg | ORAL_TABLET | Freq: Three times a day (TID) | ORAL | Status: DC | PRN

## 2016-09-25 MED ORDER — ONDANSETRON HCL 4 MG PO TABS: 4.0000 mg | ORAL_TABLET | Freq: Three times a day (TID) | ORAL | Status: DC | PRN

## 2016-09-25 MED ORDER — ALUM & MAG HYDROXIDE-SIMETH 200-200-20 MG/5ML PO SUSP: 30.0000 mL | Freq: Four times a day (QID) | ORAL | Status: DC | PRN

## 2016-09-25 NOTE — ED Notes (Signed)
Pt d/c home per MD order. Discharge summary reviewed with pt. Pt verbalizes understanding. Pt denies SI/HI/AVH. Personal property returned to pt. Pt signed e-signature. Ambulatory off unit with [facility].  

## 2016-09-25 NOTE — [facility] Suicide Risk Assessment (Signed)
Suicide Risk Assessment  Discharge Assessment   [facility] Discharge Suicide Risk Assessment   Principal Problem: Alcohol abuse with alcohol-induced mood disorder [facility]) Discharge Diagnoses:  Patient Active Problem List   Diagnosis Date Noted  . Alcohol abuse with alcohol-induced mood disorder (HCC) [F10.14] 09/25/2016  . Polysubstance dependence (HCC) [F19.20] 09/25/2016  . Substance use disorder [F19.90] 09/19/2016  . MDD (major depressive disorder), recurrent severe, without psychosis (HCC) [F33.2] 09/18/2016  . 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 Human immunodeficiency virus (HIV) disease (HCC) [B20] 11/09/2008    Total Time spent with patient: 45 minutes  Musculoskeletal: Strength & Muscle Tone: within normal limits Gait & Station: normal Patient leans: N/A  Psychiatric Specialty Exam: Physical Exam ROS Blood pressure 119/77, pulse 66, temperature (!) 97.4 F (36.3 C), temperature source Oral, resp. rate 16, SpO2 100 %.There is no height or weight on file to calculate BMI. General Appearance: Casual Eye Contact:  Fair Speech:  Clear and Coherent Volume:  Normal Mood:  Depressed Affect:  Congruent and Depressed Thought Process:  Coherent, Goal Directed and Linear Orientation:  Full (Time, Place, and Person) Thought Content:  Logical Suicidal Thoughts:  No Homicidal Thoughts:  No Memory:  Immediate;   Good Recent;   Fair Remote;   Fair Judgement:  Fair Insight:  Present Psychomotor Activity:  Normal Concentration:  Concentration: Good and Attention Span: Good Recall:  Good Fund of Knowledge:  Fair Language:  Good Akathisia:  No Handed:  Right AIMS (if indicated):    Assets:  Architect Housing Resilience Social Support ADL's:  Intact Cognition:  WNL  Mental Status Per Nursing Assessment::   On Admission:   suicidal and "high" on drugs and alcohol  Demographic Factors:  Male, Low socioeconomic status and  Unemployed  Loss Factors: Financial problems/change in socioeconomic status  Historical Factors: Impulsivity  Risk Reduction Factors:   Sense of responsibility to family  Continued Clinical Symptoms:  Depression:   Impulsivity Alcohol/Substance Abuse/Dependencies More than one psychiatric diagnosis Previous Psychiatric Diagnoses and Treatments  Cognitive Features That Contribute To Risk:  Closed-mindedness    Suicide Risk:  Minimal: No identifiable suicidal ideation.  Patients presenting with no risk factors but with morbid ruminations; may be classified as minimal risk based on the severity of the depressive symptoms  Follow-up Information    [facility]. Go on 09/27/2016.   Why:  10:00 to see financial counselor and have labs drawn Contact information: [address] [id number] mc [place] [address] [phone]          Plan Of Care/Follow-up recommendations:  Activity:  as tolerated Diet:  Heart Healthy  Laveda Abbe, NP 09/25/2016, 11:49 AM

## 2016-09-25 NOTE — BH Assessment (Addendum)
Tele Assessment Note   Patient Name: [name] MRN: [id number] Referring Physician: Wallace Keller Location of Patient: [place] Location of Provider: Behavioral Health TTS Department  [name] is an 56 y.o. male who presents to the ED voluntarily. Pt initially came to [facility] as a walk-in but was sent to [facility] via EMS due to experiencing multiple episodes of diarrhea soon after arriving to [facility]. Pt reports he has been experiencing suicidal ideations since his mother and sister died last month. Pt reports he had a plan to OD on his medication.   Pt reports he is a chronic alcoholic and has been binge drinking up to an 18-pack a day for months. Pt stated "I want to give up and kill myself but I also want help." Pt reports he snorted 6 lines of cocaine 2 days ago but reports he "normally doesn't use cocaine." Pt was recently d/c from [facility] on 09/21/16 with instructions to follow up with [facility]. Pt reports [facility] does not have beds and pt is currently on the wait list. Pt reports he continued calling [facility] but kept being told they did not have any beds and pt became depressed and wanted to kill himself.   Pt reports he has trouble falling asleep, not taking his medications regularly, and eating only one time a day. Pt denies HI and denies AVH at present.  Per Nira Conn, NP pt meets criteria for inpt treatment. TTS to seek placement. No appropriate beds at [facility].   Diagnosis: MDD, recurrent, severe w/o psychosis; Cocaine Use D/O; Alcohol Use D/O  Past Medical History:  Past Medical History:  Diagnosis Date  . 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 Hemorrhoids   . HIV (human immunodeficiency virus infection) (HCC)     Past Surgical History:  Procedure Laterality Date  . HERNIA REPAIR  97   lt ing  . RECTAL EXAM UNDER ANESTHESIA N/A 04/24/2014   Procedure:  ligation of bleeding vessels;  Surgeon: Almond Lint, MD;  Location[facility];  Service: General;  Laterality: N/A;  . TENDON REPAIR      2006-lt index  . WART FULGURATION  07/06/2011   Procedure: FULGURATION ANAL WART;  Surgeon: Shelly Rubenstein, MD;  Location: [facility];  Service: General;  Laterality: N/A;  excision anal condyloma  . WART FULGURATION N/A 04/23/2014   Procedure: EXCISION OF ANAL CONDYLOMA;  Surgeon: Abigail Miyamoto, MD;  [facility];  Service: General;  Laterality: N/A;    Family History:  Family History  Problem Relation Age of Onset  . Cancer Mother        brain  . Cancer Sister        breast    Social History:  reports that he has been smoking Cigarettes.  He has a 12.50 pack-year smoking history. He has never used smokeless tobacco. He reports that he drinks alcohol. He reports that he does not use drugs.  Additional Social History:  Alcohol / Drug Use Pain Medications: See MAR Prescriptions: See MAR Over the Counter: See MAR History of alcohol / drug use?: Yes Substance #1 Name of Substance 1: Alcohol 1 - Age of First Use: 27 1 - Amount (size/oz): 18 cans of beer 1 - Frequency: daily 1 - Duration: Ongoing for years 1 - Last Use / Amount: 09/24/16 Substance #2 Name of Substance 2: Cocaine 2 - Age of First Use: unknown, when asked how old pt was when he first began using cocaine pt stated "years ago, I normally  don't" did not provide age of first use  2 - Amount (size/oz): 6 lines 2 - Frequency: pt reports "not often" 2 - Duration: ongoing 2 - Last Use / Amount: 09/23/16  CIWA: CIWA-Ar BP: 127/77 Pulse Rate: 80 Nausea and Vomiting: no nausea and no vomiting Tactile Disturbances: none Tremor: no tremor Auditory Disturbances: not present Paroxysmal Sweats: no sweat visible Visual Disturbances: not present Anxiety: two Headache, Fullness in Head: very mild Agitation: normal activity Orientation and Clouding of Sensorium: oriented and can do serial additions CIWA-Ar Total: 3 COWS:    PATIENT STRENGTHS: (choose at least two) Capable of independent  living Metallurgist fund of knowledge Motivation for treatment/growth  Allergies:  Allergies  Allergen Reactions  . Shellfish Allergy Anaphylaxis and Swelling    Swelling everywhere  . Sulfa Antibiotics Anaphylaxis, Hives, Itching and Swelling    Swelling all over     Home Medications:  (Not in a hospital admission)  OB/GYN Status:  No LMP for male patient.  General Assessment Data [facility] of Assessment: [facility] TTS Assessment: In system Is this a Tele or Face-to-Face Assessment?: Tele Assessment Is this an Initial Assessment or a Re-assessment for this encounter?: Initial Assessment Marital status: Single Is patient pregnant?: No Pregnancy Status: No Living Arrangements: Alone Can pt return to current living arrangement?: Yes Admission Status: Voluntary Is patient capable of signing voluntary admission?: Yes Referral Source: Self/Family/Friend Insurance type: Medicare     Crisis Care Plan Living Arrangements: Alone Name of Psychiatrist: None Name of Therapist: None  Education Status Is patient currently in school?: No Highest grade of school patient has completed: 12th  Risk to self with the past 6 months Suicidal Ideation: Yes-Currently Present Has patient been a risk to self within the past 6 months prior to admission? : Yes Suicidal Intent: Yes-Currently Present Has patient had any suicidal intent within the past 6 months prior to admission? : Yes Is patient at risk for suicide?: Yes Suicidal Plan?: Yes-Currently Present Has patient had any suicidal plan within the past 6 months prior to admission? : Yes Specify Current Suicidal Plan: pt reports a plan to OD on medication Access to Means: Yes Specify Access to Suicidal Means: pt states he has access to his medication What has been your use of drugs/alcohol within the last 12 months?: reports to daily alcohol use and some cocaine use Previous Attempts/Gestures: Yes How many  times?: 2 (multiple attempts) Triggers for Past Attempts: Unpredictable Intentional Self Injurious Behavior: None Family Suicide History: No Recent stressful life event(s): Loss (Comment), Other (Comment) (sister and mom passed, ongoing alcohol addiction) Persecutory voices/beliefs?: No Depression: Yes Depression Symptoms: Despondent, Insomnia, Tearfulness, Isolating, Fatigue, Feeling angry/irritable, Loss of interest in usual pleasures, Feeling worthless/self pity, Guilt Substance abuse history and/or treatment for substance abuse?: Yes Suicide prevention information given to non-admitted patients: Not applicable  Risk to Others within the past 6 months Homicidal Ideation: No Does patient have any lifetime risk of violence toward others beyond the six months prior to admission? : No Thoughts of Harm to Others: No Current Homicidal Intent: No Current Homicidal Plan: No Access to Homicidal Means: No History of harm to others?: No Assessment of Violence: None Noted Does patient have access to weapons?: No Criminal Charges Pending?: No Does patient have a court date: No Is patient on probation?: No  Psychosis Hallucinations: None noted Delusions: None noted  Mental Status Report Appearance/Hygiene: In scrubs Eye Contact: Good Motor Activity: Freedom of movement Speech: Logical/coherent Level  of Consciousness: Alert Mood: Depressed, Helpless, Worthless, low self-esteem, Despair Affect: Depressed Anxiety Level: None Thought Processes: Coherent, Relevant Judgement: Partial Orientation: Person, Place, Situation, Time, Appropriate for developmental age Obsessive Compulsive Thoughts/Behaviors: None  Cognitive Functioning Concentration: Normal Memory: Remote Intact, Recent Intact IQ: Average Insight: Poor Impulse Control: Poor Appetite: Poor Sleep: Decreased Total Hours of Sleep: 5 Vegetative Symptoms: None  ADLScreening [facility] Assessment Services) Patient's cognitive  ability adequate to safely complete daily activities?: Yes Patient able to express need for assistance with ADLs?: Yes Independently performs ADLs?: Yes (appropriate for developmental age)  Prior Inpatient Therapy Prior Inpatient Therapy: Yes Prior Therapy Dates: 2018, 2017, 2011 and mult others  Prior Therapy Facilty/Provider(s): [facility], [place] Reason for Treatment: MDD, ALCOHOL  Prior Outpatient Therapy Prior Outpatient Therapy: No Does patient have an ACCT team?: No Does patient have Intensive In-House Services?  : No Does patient have [facility] services? : No Does patient have P4CC services?: No  ADL Screening (condition at time of admission) Patient's cognitive ability adequate to safely complete daily activities?: Yes Is the patient deaf or have difficulty hearing?: No Does the patient have difficulty seeing, even when wearing glasses/contacts?: No Does the patient have difficulty concentrating, remembering, or making decisions?: No Patient able to express need for assistance with ADLs?: Yes Does the patient have difficulty dressing or bathing?: No Independently performs ADLs?: Yes (appropriate for developmental age) Does the patient have difficulty walking or climbing stairs?: No Weakness of Legs: None Weakness of Arms/Hands: None  Home Assistive Devices/Equipment Home Assistive Devices/Equipment: None    Abuse/Neglect Assessment (Assessment to be complete while patient is alone) Physical Abuse: Denies Verbal Abuse: Denies Sexual Abuse: Denies Exploitation of patient/patient's resources: Denies Self-Neglect: Denies     Merchant navy officer (For Healthcare) Does Patient Have a Medical Advance Directive?: No Would patient like information on creating a medical advance directive?: No - Patient declined Nutrition Screen- [facility] Adult/[facility] Patient's home diet:  (pt. given soda, sandwich and ravioli.)  Additional Information 1:1 In Past 12 Months?: No CIRT Risk: No Elopement  Risk: No Does patient have medical clearance?: Yes     Disposition:  Disposition Initial Assessment Completed for this Encounter: Yes Disposition of Patient: Inpatient treatment program Type of inpatient treatment program: Adult (per Nira Conn, NP)  This service was provided via telemedicine using a 2-way, interactive audio and video technology.  Names of all persons participating in this telemedicine service and their role in this encounter. Name: Princess Bruins Role: TTS Counselor  Name: [name] Role: Patient          Karolee Ohs 09/25/2016 4:44 AM

## 2016-09-25 NOTE — Consult Note (Signed)
[place] Psychiatry Consult   Reason for Consult:  Suicidal Ideation Referring Physician:  EDP Patient Identification: [name] MRN:  [id number] Principal Diagnosis: Alcohol abuse with alcohol-induced mood disorder [facility]) Diagnosis:   Patient Active Problem List   Diagnosis Date Noted  . Alcohol abuse with alcohol-induced mood disorder ([place]) [F10.14] 09/25/2016  . Polysubstance dependence ([place]) [F19.20] 09/25/2016  . Substance use disorder [F19.90] 09/19/2016  . MDD (major depressive disorder), recurrent severe, without psychosis ([place]) [F33.2] 09/18/2016  . 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 Human immunodeficiency virus (HIV) disease ([place]) [B20] 11/09/2008    Total Time spent with patient: 45 minutes  Subjective:   [name] is a 56 y.o. male patient admitted with suicidal ideation with substance abuse: alcohol, benzos, and cocaine.  HPI:  Pt was seen and chart reviewed with treatment team and Dr Darleene Cleaver. Pt stated he drinks a lot and uses cocaine and benzos and becomes suicidal when he is "high" on substances. UDS positive for benzos and cocaine, BAL 157 on admission. Pt is HIV positive and has an appointment with [facility] on 09/27/16. Pt was discharged from [facility] on 09-21-16 with instructions to follow up at [facility] but he did not follow up. Today,  Pt denies suicidal/homicidal ideation, denies auditory/visual hallucinations and does not appear to be responding to internal stimuli. Pt denies paranoia, psychosis and mood swings. Pt is psychiatrically clear for discharge.   Past Psychiatric History: As above  Risk to Self: None Risk to Others: None Prior Inpatient Therapy: Prior Inpatient Therapy: Yes Prior Therapy Dates: 2018, 2017, 2011 and mult others  Prior Therapy Facilty/Provider(s): [facility], [place] Reason for Treatment: MDD, ALCOHOL Prior Outpatient Therapy: Prior Outpatient Therapy: No Does patient have an ACCT team?: No Does patient have Intensive In-House Services?   : No Does patient have [facility] services? : No Does patient have P4CC services?: No  Past Medical History:  Past Medical History:  Diagnosis Date  . 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 Hemorrhoids   . HIV (human immunodeficiency virus infection) ([place])     Past Surgical History:  Procedure Laterality Date  . HERNIA REPAIR  97   lt ing  . RECTAL EXAM UNDER ANESTHESIA N/A 04/24/2014   Procedure:  ligation of bleeding vessels;  Surgeon: Stark Klein, MD;  [facility];  Service: General;  Laterality: N/A;  . TENDON REPAIR     2006-lt index  . WART FULGURATION  07/06/2011   Procedure: FULGURATION ANAL WART;  Surgeon: Harl Bowie, MD;  Location: [place];  Service: General;  Laterality: N/A;  excision anal condyloma  . WART FULGURATION N/A 04/23/2014   Procedure: EXCISION OF ANAL CONDYLOMA;  Surgeon: Coralie Keens, MD;  [facility];  Service: General;  Laterality: N/A;   Family History:  Family History  Problem Relation Age of Onset  . Cancer Mother        brain  . Cancer Sister        breast   Family Psychiatric  History: Unknown Social History:  History  Alcohol Use  . 0.0 oz/week    Comment: occasional beer      History  Drug Use No    Social History   Social History  . Marital status: Single    Spouse name: N/A  . Number of children: N/A  . Years of education: N/A   Social History Main Topics  . Smoking status: Current Every Day Smoker    Packs/day: 0.50    Years: 25.00  Types: Cigarettes  . Smokeless tobacco: Never Used  . Alcohol use 0.0 oz/week     Comment: occasional beer   . Drug use: No  . Sexual activity: No   Other Topics Concern  . None   Social History Narrative  . None   Additional Social History:    Allergies:   Allergies  Allergen Reactions  . Shellfish Allergy Anaphylaxis and Swelling    Swelling everywhere  . Sulfa Antibiotics Anaphylaxis, Hives, Itching and Swelling    Swelling all over     Labs:   Results for orders placed or performed during the hospital encounter of 09/24/16 (from the past 48 hour(s))  Rapid urine drug screen (hospital performed)     Status: Abnormal   Collection Time: 09/24/16 10:48 PM  Result Value Ref Range   Opiates NONE DETECTED NONE DETECTED   Cocaine POSITIVE (A) NONE DETECTED   Benzodiazepines POSITIVE (A) NONE DETECTED   Amphetamines NONE DETECTED NONE DETECTED   Tetrahydrocannabinol NONE DETECTED NONE DETECTED   Barbiturates NONE DETECTED NONE DETECTED    Comment:        DRUG SCREEN FOR MEDICAL PURPOSES ONLY.  IF CONFIRMATION IS NEEDED FOR ANY PURPOSE, NOTIFY LAB WITHIN 5 DAYS.        LOWEST DETECTABLE LIMITS FOR URINE DRUG SCREEN Drug Class       Cutoff (ng/mL) Amphetamine      1000 Barbiturate      200 Benzodiazepine   616 Tricyclics       073 Opiates          300 Cocaine          300 THC              50   CBC with Differential     Status: Abnormal   Collection Time: 09/24/16 11:12 PM  Result Value Ref Range   WBC 3.7 (L) 4.0 - 10.5 K/uL   RBC 4.79 4.22 - 5.81 MIL/uL   Hemoglobin 13.6 13.0 - 17.0 g/dL   HCT 40.0 39.0 - 52.0 %   MCV 83.5 78.0 - 100.0 fL   MCH 28.4 26.0 - 34.0 pg   MCHC 34.0 30.0 - 36.0 g/dL   RDW 14.7 11.5 - 15.5 %   Platelets 250 150 - 400 K/uL   Neutrophils Relative % 43 %   Neutro Abs 1.6 (L) 1.7 - 7.7 K/uL   Lymphocytes Relative 48 %   Lymphs Abs 1.8 0.7 - 4.0 K/uL   Monocytes Relative 7 %   Monocytes Absolute 0.3 0.1 - 1.0 K/uL   Eosinophils Relative 2 %   Eosinophils Absolute 0.1 0.0 - 0.7 K/uL   Basophils Relative 0 %   Basophils Absolute 0.0 0.0 - 0.1 K/uL  Comprehensive metabolic panel     Status: Abnormal   Collection Time: 09/24/16 11:12 PM  Result Value Ref Range   Sodium 137 135 - 145 mmol/L   Potassium 3.6 3.5 - 5.1 mmol/L   Chloride 104 101 - 111 mmol/L   CO2 22 22 - 32 mmol/L   Glucose, Bld 78 65 - 99 mg/dL   BUN 17 6 - 20 mg/dL   Creatinine, Ser 1.01 0.61 - 1.24 mg/dL   Calcium 9.1 8.9  - 10.3 mg/dL   Total Protein 7.7 6.5 - 8.1 g/dL   Albumin 4.5 3.5 - 5.0 g/dL   AST 46 (H) 15 - 41 U/L   ALT 33 17 - 63 U/L   Alkaline Phosphatase 39 38 -  126 U/L   Total Bilirubin 0.7 0.3 - 1.2 mg/dL   GFR calc non Af Amer >60 >60 mL/min   GFR calc Af Amer >60 >60 mL/min    Comment: (NOTE) The eGFR has been calculated using the CKD EPI equation. This calculation has not been validated in all clinical situations. eGFR's persistently <60 mL/min signify possible Chronic Kidney Disease.    Anion gap 11 5 - 15  Salicylate level     Status: Abnormal   Collection Time: 09/24/16 11:12 PM  Result Value Ref Range   Salicylate Lvl <0.7 (L) 2.8 - 30.0 mg/dL  Acetaminophen level     Status: Abnormal   Collection Time: 09/24/16 11:12 PM  Result Value Ref Range   Acetaminophen (Tylenol), Serum <10 (L) 10 - 30 ug/mL    Comment:        THERAPEUTIC CONCENTRATIONS VARY SIGNIFICANTLY. A RANGE OF 10-30 ug/mL MAY BE AN EFFECTIVE CONCENTRATION FOR MANY PATIENTS. HOWEVER, SOME ARE BEST TREATED AT CONCENTRATIONS OUTSIDE THIS RANGE. ACETAMINOPHEN CONCENTRATIONS >150 ug/mL AT 4 HOURS AFTER INGESTION AND >50 ug/mL AT 12 HOURS AFTER INGESTION ARE OFTEN ASSOCIATED WITH TOXIC REACTIONS.   Ethanol     Status: Abnormal   Collection Time: 09/24/16 11:12 PM  Result Value Ref Range   Alcohol, Ethyl (B) 157 (H) <5 mg/dL    Comment:        LOWEST DETECTABLE LIMIT FOR SERUM ALCOHOL IS 5 mg/dL FOR MEDICAL PURPOSES ONLY   Troponin I     Status: None   Collection Time: 09/24/16 11:12 PM  Result Value Ref Range   Troponin I <0.03 <0.03 ng/mL  Brain natriuretic peptide     Status: None   Collection Time: 09/24/16 11:12 PM  Result Value Ref Range   B Natriuretic Peptide 28.5 0.0 - 100.0 pg/mL  I-Stat CG4 Lactic Acid, ED     Status: None   Collection Time: 09/24/16 11:28 PM  Result Value Ref Range   Lactic Acid, Venous 1.83 0.5 - 1.9 mmol/L    Current Facility-Administered Medications  Medication  Dose Route Frequency Provider Last Rate Last Dose  . alum & mag hydroxide-simeth (MAALOX/MYLANTA) 200-200-20 MG/5ML suspension 30 mL  30 mL Oral Q6H PRN Pisciotta, Nicole, PA-C      . baclofen (LIORESAL) tablet 10 mg  10 mg Oral TID PRN Pisciotta, Joni Reining, PA-C      . buPROPion (WELLBUTRIN XL) 24 hr tablet 300 mg  300 mg Oral BH-q7a Pisciotta, Nicole, PA-C   300 mg at 09/25/16 0835  . darunavir-cobicistat (PREZCOBIX) 800-150 MG per tablet 1 tablet  1 tablet Oral Daily Pisciotta, Joni Reining, PA-C   1 tablet at 09/25/16 0917  . emtricitabine-tenofovir AF (DESCOVY) 200-25 MG per tablet 1 tablet  1 tablet Oral Daily Pisciotta, Joni Reining, PA-C   1 tablet at 09/25/16 0917  . ibuprofen (ADVIL,MOTRIN) tablet 600 mg  600 mg Oral Q8H PRN Pisciotta, Nicole, PA-C      . ibuprofen (ADVIL,MOTRIN) tablet 800 mg  800 mg Oral Q8H PRN Pisciotta, Joni Reining, PA-C      . LORazepam (ATIVAN) injection 0-4 mg  0-4 mg Intravenous Q6H Pisciotta, Nicole, PA-C       Or  . LORazepam (ATIVAN) tablet 0-4 mg  0-4 mg Oral Q6H Pisciotta, Nicole, PA-C      . [START ON 09/27/2016] LORazepam (ATIVAN) injection 0-4 mg  0-4 mg Intravenous Q12H Pisciotta, Nicole, PA-C       Or  . [START ON 09/27/2016] LORazepam (ATIVAN) tablet 0-4 mg  0-4 mg Oral Q12H Pisciotta, Nicole, PA-C      . nicotine (NICODERM CQ - dosed in mg/24 hours) patch 21 mg  21 mg Transdermal Daily Pisciotta, Nicole, PA-C      . ondansetron (ZOFRAN) tablet 4 mg  4 mg Oral Q8H PRN Pisciotta, Nicole, PA-C      . thiamine (VITAMIN B-1) tablet 100 mg  100 mg Oral Daily Demetrios Loll T, PA-C   100 mg at 09/25/16 5587   Or  . thiamine (B-1) injection 100 mg  100 mg Intravenous Daily Leaphart, Kenneth T, PA-C      . traZODone (DESYREL) tablet 50 mg  50 mg Oral QHS PRN Pisciotta, Nicole, PA-C      . zolpidem (AMBIEN) tablet 5 mg  5 mg Oral QHS PRN Pisciotta, Joni Reining, PA-C       Current Outpatient Prescriptions  Medication Sig Dispense Refill  . baclofen (LIORESAL) 10 MG tablet Take 1  tablet (10 mg total) by mouth 3 (three) times daily as needed for muscle spasms. 1 each 0  . buPROPion (WELLBUTRIN XL) 300 MG 24 hr tablet Take 1 tablet (300 mg total) by mouth every morning. For depression 10 tablet 0  . darunavir-cobicistat (PREZCOBIX) 800-150 MG tablet Take 1 tablet by mouth daily. Swallow whole. Do NOT crush, break or chew tablets. Take with food: For HIV infection 1 tablet 0  . emtricitabine-tenofovir AF (DESCOVY) 200-25 MG tablet Take 1 tablet by mouth daily. For HIV infection 1 tablet 0  . ibuprofen (ADVIL,MOTRIN) 800 MG tablet Take 1 tablet (800 mg total) by mouth every 8 (eight) hours as needed for headache, mild pain or moderate pain. 1 tablet 0  . traZODone (DESYREL) 50 MG tablet Take 1 tablet (50 mg total) by mouth at bedtime as needed for sleep. 30 tablet 0    Musculoskeletal: Strength & Muscle Tone: within normal limits Gait & Station: normal Patient leans: N/A  Psychiatric Specialty Exam: Physical Exam  ROS  Blood pressure 119/77, pulse 66, temperature (!) 97.4 F (36.3 C), temperature source Oral, resp. rate 16, SpO2 100 %.There is no height or weight on file to calculate BMI.  General Appearance: Casual  Eye Contact:  Fair  Speech:  Clear and Coherent  Volume:  Normal  Mood:  Depressed  Affect:  Congruent and Depressed  Thought Process:  Coherent, Goal Directed and Linear  Orientation:  Full (Time, Place, and Person)  Thought Content:  Logical  Suicidal Thoughts:  No  Homicidal Thoughts:  No  Memory:  Immediate;   Good Recent;   Fair Remote;   Fair  Judgement:  Fair  Insight:  Present  Psychomotor Activity:  Normal  Concentration:  Concentration: Good and Attention Span: Good  Recall:  Good  Fund of Knowledge:  Fair  Language:  Good  Akathisia:  No  Handed:  Right  AIMS (if indicated):     Assets:  Architect Housing Resilience Social Support  ADL's:  Intact  Cognition:  WNL  Sleep:         Treatment Plan Summary: Plan Discharge Home  Follow up with [facility] for therapy and substance abuse treatment. Follow up with [facility] for assistance with community resources for substance abuse.  Avoid the use of alcohol and illicit drugs. Take medications only as prescribed  Disposition: No evidence of imminent risk to self or others at present.   Patient does not meet criteria for psychiatric inpatient admission. Supportive therapy provided about ongoing  stressors. Discussed crisis plan, support from social network, calling 911, coming to the Emergency Department, and calling Suicide Hotline.  Ethelene Hal, NP 09/25/2016 11:47 AM  Patient seen face-to-face for psychiatric evaluation, chart reviewed and case discussed with the physician extender and developed treatment plan. Reviewed the information documented and agree with the treatment plan. Corena Pilgrim, MD

## 2016-09-25 NOTE — ED Notes (Signed)
This Clinical research associate assist PT to restroom. PT Off balance with no dizziness

## 2016-09-25 NOTE — ED Notes (Signed)
Pt admitted to room #40. Pt reports depression and that he is currently waiting for a bed at [facility]. Pt denies SI/HI/AVH. Encouragement and support provided. Special checks q 15 mins in place for safety. Video monitoring in place. Will continue to monitor.

## 2016-09-25 NOTE — BHH Suicide Risk Assessment (Deleted)
Suicide Risk Assessment  Discharge Assessment   [facility] Discharge Suicide Risk Assessment   Principal Problem: <principal problem not specified> Discharge Diagnoses:  Patient Active Problem List   Diagnosis Date Noted  . Substance use disorder [F19.90] 09/19/2016  . MDD (major depressive disorder), recurrent severe, without psychosis (HCC) [F33.2] 09/18/2016  . 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 Human immunodeficiency virus (HIV) disease (HCC) [B20] 11/09/2008    Total Time spent with patient: 45 minutes  Musculoskeletal: Strength & Muscle Tone: within normal limits Gait & Station: normal Patient leans: N/A  Psychiatric Specialty Exam:   Blood pressure 119/77, pulse 66, temperature (!) 97.4 F (36.3 C), temperature source Oral, resp. rate 16, SpO2 100 %.There is no height or weight on file to calculate BMI.  General Appearance: Casual  Eye Contact::  Fair  Speech:  Clear and Coherent409  Volume:  Normal  Mood:  Depressed  Affect:  Congruent and Depressed  Thought Process:  Coherent, Goal Directed and Linear  Orientation:  Full (Time, Place, and Person)  Thought Content:  Logical  Suicidal Thoughts:  No  Homicidal Thoughts:  No  Memory:  Immediate;   Good Recent;   Fair Remote;   Fair  Judgement:  Poor  Insight:  Present  Psychomotor Activity:  Normal  Concentration:  Good  Recall:  Good  Fund of Knowledge:Fair  Language: Good  Akathisia:  No  Handed:  Right  AIMS (if indicated):     Assets:  Architect Housing Resilience  Sleep:     Cognition: WNL  ADL's:  Intact   Mental Status Per Nursing Assessment::   On Admission:     Demographic Factors:  Male, Low socioeconomic status and Unemployed  Loss Factors: Financial problems/change in socioeconomic status  Historical Factors: Impulsivity  Risk Reduction Factors:   Sense of responsibility to family  Continued Clinical Symptoms:  Depression:    Impulsivity Alcohol/Substance Abuse/Dependencies More than one psychiatric diagnosis Previous Psychiatric Diagnoses and Treatments Medical Diagnoses and Treatments/Surgeries  Cognitive Features That Contribute To Risk:  Closed-mindedness    Suicide Risk:  Minimal: No identifiable suicidal ideation.  Patients presenting with no risk factors but with morbid ruminations; may be classified as minimal risk based on the severity of the depressive symptoms    Plan Of Care/Follow-up recommendations:  Activity:  as tolerated Diet:  Heart Healthy  Laveda Abbe, NP 09/25/2016, 10:35 AM

## 2016-09-25 NOTE — BH Assessment (Signed)
Per Dr. Jannifer Franklin and Nanine Means, DNP, patient is psychiatrically cleared. IVC rescinded by Dr. Jannifer Franklin. Patient to discharge with substance abuse outpatient referrals and peer support services. Peer support contacted and agreed to meet with patient. Peer support specialist  will further assist with setting up substance abuse services.   Dr. Jannifer Franklin and Elta Guadeloupe, NP also recommended case management to assist with patients medical needs (e.g HIV+). Case management Durward Mallard) contacted and verfied that patient already has a appointment with Infectious Disease 09/27/2016 (Appointment is at 10am).

## 2016-09-25 NOTE — Progress Notes (Signed)
TTS called cart multiple times and continues to disconnect. Urbano Heir, Secondary school teacher who states she will place cart in the room.  Princess Bruins, MSW, LCSWA TTS Specialist [phone]

## 2016-09-25 NOTE — ED Provider Notes (Signed)
Care assumed from previous provider PA Pisciotta. Please see their note for further details to include full history and physical. To summarize in short pt is a 56 year-old [place] American male history of HIV, alcohol abuse, alcohol withdrawal seizures presents to the ED with suicidal ideations with plan to overdose. Patient with prior attempts. Of note patient did complain of tactile fever with headaches and shortness of breath with no chest pain. Denies any cough or peripheral edema.. Case discussed, plan agreed upon. At care hand off was awaiting CT head. This was unremarkable. Chest x-ray shows no signs of focal infiltrate. Lab work is reassuring. Patient has no focal neuro deficits. Vital signs are reassuring. EKG with no acute findings. Negative troponin, BNP and chest x-ray. Given that CT head was unremarkable with no other focal findings of prior PA felt that patient can be medically cleared for TTS disposition. TTS was consult. Psych hold orders were placed. Home meds were ordered.       Rise Mu, PA-C 09/25/16 0247    Nicanor Alcon, April, MD 09/25/16 [phone]

## 2016-09-25 NOTE — Progress Notes (Signed)
TTS spoke with [name] to set up cart in the room in order to assess pt. Carmen requested 5 minutes to set up the cart.  Princess Bruins, MSW, LCSWA TTS Specialist [phone]

## 2016-09-25 NOTE — BH Assessment (Signed)
BHH Assessment Progress Note   Per Nira Conn, NP pt meets criteria for inpt treatment. TTS to seek placement. No appropriate beds at [facility]. TTS attempted to contact RN and EDP however when calling [facility], was placed on hold by nurse secretary Lennox Laity for several minutes. No answer when calling RN or EDP.  Princess Bruins, MSW, LCSWA TTS Specialist [phone]

## 2016-09-25 NOTE — Progress Notes (Signed)
TTS called again and spoke with Dr. Nicanor Alcon, April, MD to advise of disposition.  Princess Bruins, MSW, LCSWA TTS Specialist [phone]

## 2016-09-25 NOTE — BH Assessment (Addendum)
Received a call from [place] with peer support. Sts that patient is ok to discharge today and will have a bed available tommorow. Writer did try to make contact with Shayla at [facility] to inquire if any information was needed such as labs, ED notes, MAR, etc. Left a voicemail.  Patient provided with the following information:   [facility] Address: [address], [place], [place] [address] [phone]

## 2016-09-25 NOTE — ED Notes (Signed)
Pt. In burgundy scrubs. Pt. wanded by security. Pt. Has 1 red bag with soiled clothes and 1 red suitcase and 1 black bookbag. Pt. Belongings in blue zone on RESUS side.

## 2016-09-25 NOTE — Discharge Planning (Signed)
[name] J. Lucretia Roers, RN, BSN, [place] [phone]  [facility] set up appointment with [facility] on 9/19.  Spoke with pt at bedside and advised to please arrive 15 min early and take a picture ID and your current medications.  Pt verbalizes understanding of keeping appointment.

## 2016-09-27 ENCOUNTER — Other Ambulatory Visit: Payer: Self-pay

## 2016-09-30 LAB — CULTURE, BLOOD (ROUTINE X 2)
Culture: NO GROWTH
Culture: NO GROWTH
Special Requests: ADEQUATE

## 2016-10-10 ENCOUNTER — Emergency Department (HOSPITAL_COMMUNITY): Payer: Medicare Other

## 2016-10-10 ENCOUNTER — Emergency Department (HOSPITAL_COMMUNITY)
Admission: EM | Admit: 2016-10-10 | Discharge: 2016-10-10 | Disposition: A | Discharge status: Home / Self Care | Payer: Medicare Other | Attending: Emergency Medicine | Admitting: Emergency Medicine

## 2016-10-10 ENCOUNTER — Ambulatory Visit: Payer: Self-pay | Admitting: Internal Medicine

## 2016-10-10 DIAGNOSIS — F17210 Nicotine dependence, cigarettes, uncomplicated: Secondary | ICD-10-CM | POA: Diagnosis not present

## 2016-10-10 DIAGNOSIS — B20 Human immunodeficiency virus [HIV] disease: Secondary | ICD-10-CM | POA: Insufficient documentation

## 2016-10-10 DIAGNOSIS — Z79899 Other long term (current) drug therapy: Secondary | ICD-10-CM | POA: Insufficient documentation

## 2016-10-10 DIAGNOSIS — M25561 Pain in right knee: Secondary | ICD-10-CM | POA: Diagnosis not present

## 2016-10-10 DIAGNOSIS — G8929 Other chronic pain: Secondary | ICD-10-CM

## 2016-10-10 MED ORDER — IBUPROFEN 800 MG PO TABS
800.0000 mg | ORAL_TABLET | Freq: Once | ORAL | Status: AC
  Administered 2016-10-10: 800 mg via ORAL
  Filled 2016-10-10: qty 1

## 2016-10-10 NOTE — ED Triage Notes (Signed)
Pt presents with right anterior knee pain x 9 months, but has gotten worse this week. Cool to touch, but visibly inflamed.

## 2016-10-10 NOTE — Discharge Instructions (Signed)
Your xray today showed that you have right knee osteoarthritis. There is no fracture.   Please use Ibuprofen  800 mg every 6 hours as needed for pain and inflammation. Applying heat to the knee will also help with your pain.  Please use the knee sleeve we have given you today for stability.   Please return to the emergency department if you develop fever,  are unable to flex and extend the knee due to to pain,  if you notice that the joint is red/swollen/warm to the touch.  Also return for any new or worsening symptoms.

## 2016-10-10 NOTE — ED Provider Notes (Signed)
[facility]-EMERGENCY DEPT Provider Note   CSN: [id number] Arrival date & time: 10/10/16  1931     History   Chief Complaint Chief Complaint  Patient presents with  . Knee Pain    HPI Twan Harkin is a 56 y.o. male.  HPI   Mr. Dirr Is a 56 year old male with a history of HIV (currently on antiretrovirals, last CD4 count 370), depression, polysubstance dependence who presents to the emergency department for evaluation of right knee pain. Patient states that he has had right knee pain and swelling for the past 8 months, but says that the pain and swelling has worsened over the past week. States that the pain is 10/10 in severity, "throbbing" in nature, constant and worsened with walking. Reports taking ibuprofen 800 mg for the pain, with minimal relief. He denies injury to the joint, no previous surgeries on this joint. He denies fever, numbness, weakness, erythema, warmth, joint pain elsewhere. He is able to ambulate independently, although painful.  Past Medical History:  Diagnosis Date  . 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 Hemorrhoids   . HIV (human immunodeficiency virus infection) [facility])     Patient Active Problem List   Diagnosis Date Noted  . Alcohol abuse with alcohol-induced mood disorder (HCC) 09/25/2016  . Polysubstance dependence (HCC) 09/25/2016  . Substance use disorder 09/19/2016  . 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 Human immunodeficiency virus (HIV) disease (HCC) 11/09/2008    Past Surgical History:  Procedure Laterality Date  . HERNIA REPAIR  97   lt ing  . RECTAL EXAM UNDER ANESTHESIA N/A 04/24/2014   Procedure:  ligation of bleeding vessels;  Surgeon: Almond Lint, MD;  Location: [facility];  Service: General;  Laterality: N/A;  . TENDON REPAIR     2006-lt index  . WART FULGURATION  07/06/2011   Procedure: FULGURATION ANAL WART;  Surgeon: Shelly Rubenstein, MD;  Location: [facility];  Service: General;  Laterality: N/A;  excision anal condyloma  . WART FULGURATION N/A 04/23/2014   Procedure: EXCISION OF ANAL CONDYLOMA;  Surgeon: Abigail Miyamoto, MD;  Location: [facility];  Service: General;  Laterality: N/A;       Home Medications    Prior to Admission medications   Medication Sig Start Date End Date Taking? Authorizing Provider  baclofen (LIORESAL) 10 MG tablet Take 1 tablet (10 mg total) by mouth 3 (three) times daily as needed for muscle spasms. 09/21/16   Armandina Stammer I, NP  buPROPion (WELLBUTRIN XL) 300 MG 24 hr tablet Take 1 tablet (300 mg total) by mouth every morning. For depression 09/21/16   Armandina Stammer I, NP  darunavir-cobicistat (PREZCOBIX) 800-150 MG tablet Take 1 tablet by mouth daily. Swallow whole. Do NOT crush, break or chew tablets. Take with food: For HIV infection 09/21/16   Armandina Stammer I, NP  emtricitabine-tenofovir AF (DESCOVY) 200-25 MG tablet Take 1 tablet by mouth daily. For HIV infection 09/21/16   Armandina Stammer I, NP  ibuprofen (ADVIL,MOTRIN) 800 MG tablet Take 1 tablet (800 mg total) by mouth every 8 (eight) hours as needed for headache, mild pain or moderate pain. 09/21/16   Armandina Stammer I, NP  traZODone (DESYREL) 50 MG tablet Take 1 tablet (50 mg total) by mouth at bedtime as needed for sleep. 09/21/16   Sanjuana Kava, NP    Family History Family History  Problem Relation Age of Onset  . Cancer Mother  brain  . Cancer Sister        breast    Social History Social History  Substance Use Topics  . Smoking status: Current Every Day Smoker    Packs/day: 0.50    Years: 25.00    Types: Cigarettes  . Smokeless tobacco: Never Used  . Alcohol use 0.0 oz/week     Comment: occasional beer      Allergies   Shellfish allergy and Sulfa antibiotics   Review of Systems Review of Systems  Constitutional: Negative for chills, fatigue and fever.  Musculoskeletal: Positive for arthralgias (right knee joint) and joint  swelling. Negative for back pain and gait problem.  Skin: Negative for color change, rash and wound.  Neurological: Negative for weakness and numbness.  Psychiatric/Behavioral: Negative for agitation.     Physical Exam Updated Vital Signs BP 129/81   Pulse 93   Temp 98.3 F (36.8 C)   Resp 16   SpO2 97%   Physical Exam  Constitutional: He is oriented to person, place, and time. He appears well-developed and well-nourished. No distress.  HENT:  Head: Normocephalic and atraumatic.  Eyes: Right eye exhibits no discharge. Left eye exhibits no discharge.  Pulmonary/Chest: Effort normal. No respiratory distress.  Musculoskeletal:  Right knee with tenderness to palpation of lateral aspect of knee joint. Full ROM, although painful. No joint line tenderness. Mild swelling appreciated in the right knee joint, no effusion. No abnormal alignment or patellar mobility. No bruising, erythema or warmth overlaying the joint. No varus/valgus laxity. Negative drawer's, Lachman's and McMurray's.  No crepitus.  2+ DP pulses bilaterally. All compartments are soft.   Neurological: He is alert and oriented to person, place, and time. Coordination normal.  Distal sensation intact to soft touch in bilateral lower extremities.  Skin: Skin is warm and dry. Capillary refill takes less than 2 seconds. No rash noted. He is not diaphoretic. No erythema.  Psychiatric: He has a normal mood and affect. His behavior is normal.  Nursing note and vitals reviewed.    ED Treatments / Results  Labs (all labs ordered are listed, but only abnormal results are displayed) Labs Reviewed - No data to display  EKG  EKG Interpretation None       Radiology Dg Knee Complete 4 Views Right  Result Date: 10/10/2016 CLINICAL DATA:  Anterior right knee pain for 9 months, worsened this week. Visible inflammation. EXAM: RIGHT KNEE - COMPLETE 4+ VIEW COMPARISON:  June 16, 2014 FINDINGS: Tricompartmental degenerative changes  are identified, most prominent in the medial compartment with loss of joint space. No fracture, dislocation, or joint effusion. IMPRESSION: Tricompartmental degenerative changes, most prominent in the medial compartment. Electronically Signed   By: Gerome Sam III M.D   On: 10/10/2016 20:55    Procedures Procedures (including critical care time)  Medications Ordered in ED Medications  ibuprofen (ADVIL,MOTRIN) tablet 800 mg (800 mg Oral Given 10/10/16 2155)     Initial Impression / Assessment and Plan / ED Course  I have reviewed the triage vital signs and the nursing notes.  Pertinent labs & imaging results that were available during my care of the patient were reviewed by me and considered in my medical decision making (see chart for details).      X-ray of the right knee reveals chronic degenerative changes, no fracture. No warmth, erythema, effusion. Patient has full range of motion of the right knee, no fever. Given these exam findings do not suspect septic joint. Patient has been counseled  on symptomatic management including NSAIDs. Have given him a knee sleeve for stability and instructions to follow up with orthopedics. Have counseled patient on strict return precautions including development of fever, erythema/swelling/warmth over the joint. Patient voices understanding and agrees to the above plan. Patient is able to ambulate independently.  Final Clinical Impressions(s) / ED Diagnoses   Final diagnoses:  Chronic pain of right knee    New Prescriptions New Prescriptions   No medications on file     Lawrence Marseilles 10/11/16 Arie Sabina, MD 10/11/16 2121

## 2016-11-09 ENCOUNTER — Ambulatory Visit: Payer: Self-pay | Admitting: Internal Medicine

## 2016-12-14 ENCOUNTER — Ambulatory Visit: Payer: Self-pay | Admitting: Internal Medicine

## 2017-02-22 ENCOUNTER — Other Ambulatory Visit: Payer: Self-pay | Admitting: *Deleted

## 2017-02-22 DIAGNOSIS — B20 Human immunodeficiency virus [HIV] disease: Secondary | ICD-10-CM

## 2017-02-26 ENCOUNTER — Telehealth: Payer: Self-pay | Admitting: *Deleted

## 2017-02-26 DIAGNOSIS — B20 Human immunodeficiency virus [HIV] disease: Secondary | ICD-10-CM

## 2017-02-26 NOTE — Telephone Encounter (Signed)
Patient has not seen you since 08/04/15 and wants refills on his medication. He has a visit scheduled for 03/10/17 please advise if ok to refill. Advised him will call back once [place] Village[place]ider responds.

## 2017-02-27 MED ORDER — DARUNAVIR-COBICISTAT 800-150 MG PO TABS: 1.0000 | ORAL_TABLET | Freq: Every day | ORAL | 0 refills | Status: DC

## 2017-02-27 MED ORDER — EMTRICITABINE-TENOFOVIR AF 200-25 MG PO TABS: 1.0000 | ORAL_TABLET | Freq: Every day | ORAL | 0 refills | Status: DC

## 2017-02-27 NOTE — Telephone Encounter (Signed)
Patient called back today very upset his call had not been returned.  He is almost out of medications.  His reason for no office visit is rehab and says he was on medication for the duration of treatment.   He has labs scheduled for 02-28-17 and office visit in March 2019 with Dr Drue SecondSnider.   Patient was advised he will need to sign a release for records while he was away.  He was advised he will only be approved for this one time 30 day supply.   Laurell Josephsammy K King, RN

## 2017-02-27 NOTE — Addendum Note (Signed)
Addended by: Laurell JosephsKING, Abdou Stocks K on: 02/27/2017 04:22 PM   Modules accepted: Orders

## 2017-02-28 ENCOUNTER — Other Ambulatory Visit: Payer: Self-pay | Admitting: *Deleted

## 2017-02-28 ENCOUNTER — Other Ambulatory Visit: Payer: Self-pay

## 2017-02-28 DIAGNOSIS — B20 Human immunodeficiency virus [HIV] disease: Secondary | ICD-10-CM

## 2017-02-28 MED ORDER — DARUNAVIR-COBICISTAT 800-150 MG PO TABS: 1.0000 | ORAL_TABLET | Freq: Every day | ORAL | 0 refills | Status: DC

## 2017-02-28 MED ORDER — EMTRICITABINE-TENOFOVIR AF 200-25 MG PO TABS: 1.0000 | ORAL_TABLET | Freq: Every day | ORAL | 0 refills | Status: DC

## 2017-02-28 NOTE — Progress Notes (Signed)
Previous refills set as "no print." Patient called today for his 30 day supply. [facility] Andree CossHowell, Kasumi Ditullio M, RN

## 2017-03-12 ENCOUNTER — Other Ambulatory Visit: Payer: Self-pay

## 2017-03-14 ENCOUNTER — Ambulatory Visit: Payer: Self-pay | Admitting: Internal Medicine

## 2017-03-14 ENCOUNTER — Other Ambulatory Visit: Payer: Medicare Other

## 2017-03-14 DIAGNOSIS — B20 Human immunodeficiency virus [HIV] disease: Secondary | ICD-10-CM

## 2017-03-14 LAB — COMPLETE METABOLIC PANEL WITH GFR
AG Ratio: 1.5 (calc) (ref 1.0–2.5)
ALBUMIN MSPROF: 4.3 g/dL (ref 3.6–5.1)
ALT: 31 U/L (ref 9–46)
AST: 39 U/L — AB (ref 10–35)
Alkaline phosphatase (APISO): 39 U/L — ABNORMAL LOW (ref 40–115)
BUN: 14 mg/dL (ref 7–25)
CALCIUM: 9.7 mg/dL (ref 8.6–10.3)
CO2: 27 mmol/L (ref 20–32)
CREATININE: 1.26 mg/dL (ref 0.70–1.33)
Chloride: 103 mmol/L (ref 98–110)
GFR, EST NON AFRICAN AMERICAN: 63 mL/min/{1.73_m2} (ref >=60)
GFR, Est African American: 73 mL/min/{1.73_m2} (ref >=60)
GLOBULIN: 2.9 g/dL (ref 1.9–3.7)
Glucose, Bld: 72 mg/dL (ref 65–99)
Potassium: 4.3 mmol/L (ref 3.5–5.3)
Sodium: 139 mmol/L (ref 135–146)
Total Bilirubin: 0.5 mg/dL (ref 0.2–1.2)
Total Protein: 7.2 g/dL (ref 6.1–8.1)

## 2017-03-14 LAB — CBC WITH DIFFERENTIAL/PLATELET
BASOS PCT: 0.3 %
Basophils Absolute: 17 cells/uL (ref 0–200)
EOS ABS: 52 {cells}/uL (ref 15–500)
Eosinophils Relative: 0.9 %
HEMATOCRIT: 39.1 % (ref 38.5–50.0)
Hemoglobin: 13.1 g/dL — ABNORMAL LOW (ref 13.2–17.1)
Lymphs Abs: 1839 cells/uL (ref 850–3900)
MCH: 27.8 pg (ref 27.0–33.0)
MCHC: 33.5 g/dL (ref 32.0–36.0)
MCV: 82.8 fL (ref 80.0–100.0)
MPV: 11.1 fL (ref 7.5–12.5)
Monocytes Relative: 8.8 %
Neutro Abs: 3381 cells/uL (ref 1500–7800)
Neutrophils Relative %: 58.3 %
Platelets: 259 10*3/uL (ref 140–400)
RBC: 4.72 10*6/uL (ref 4.20–5.80)
RDW: 14.8 % (ref 11.0–15.0)
TOTAL LYMPHOCYTE: 31.7 %
WBC: 5.8 10*3/uL (ref 3.8–10.8)
WBCMIX: 510 {cells}/uL (ref 200–950)

## 2017-03-15 LAB — T-HELPER CELL (CD4) - (RCID CLINIC ONLY)
CD4 % Helper T Cell: 23 % — ABNORMAL LOW (ref 33–55)
CD4 T Cell Abs: 470 /uL (ref 400–2700)

## 2017-03-16 LAB — HIV-1 RNA QUANT-NO REFLEX-BLD
HIV 1 RNA QUANT: DETECTED {copies}/mL — AB
HIV-1 RNA Quant, Log: <1.3 Log copies/mL — AB

## 2017-03-27 ENCOUNTER — Ambulatory Visit (INDEPENDENT_AMBULATORY_CARE_PROVIDER_SITE_OTHER): Payer: Medicare Other | Admitting: Family

## 2017-03-27 ENCOUNTER — Ambulatory Visit: Payer: Medicare Other

## 2017-03-27 ENCOUNTER — Encounter: Payer: Self-pay | Admitting: Family

## 2017-03-27 ENCOUNTER — Ambulatory Visit: Payer: Self-pay | Admitting: Internal Medicine

## 2017-03-27 VITALS — BP 156/90 | HR 89 | Temp 97.9°F | Ht 73.0 in | Wt 163.8 lb

## 2017-03-27 DIAGNOSIS — M1711 Unilateral primary osteoarthritis, right knee: Secondary | ICD-10-CM | POA: Diagnosis not present

## 2017-03-27 DIAGNOSIS — B20 Human immunodeficiency virus [HIV] disease: Secondary | ICD-10-CM | POA: Diagnosis present

## 2017-03-27 MED ORDER — DARUNAVIR-COBICISTAT 800-150 MG PO TABS: 1.0000 | ORAL_TABLET | Freq: Every day | ORAL | 5 refills | Status: DC

## 2017-03-27 MED ORDER — EMTRICITABINE-TENOFOVIR AF 200-25 MG PO TABS: 1.0000 | ORAL_TABLET | Freq: Every day | ORAL | 5 refills | Status: DC

## 2017-03-27 NOTE — Patient Instructions (Addendum)
Very nice to meet you.   Please continue to take your Descovy and Prezcobix.   We will refer you to orthopedics for further evaluation of your knee.  Plan to follow up with Dr. Drue SecondSnider or myself in about 6 months or sooner if needed.

## 2017-03-27 NOTE — Progress Notes (Signed)
Subjective:    Patient ID: [name], male    DOB: [birth year], 57 y.o.   MRN: [id number]  Chief Complaint  Patient presents with  . Follow-up     HPI:  [name] is a 57 y.o. male who presents today for follow up of his HIV disease.   1.) HIV - [name] was last seen in the office in July 2017 and was well maintained on Prezcobix and Descovy with a viral load that was undetectable and a CD4 count of 310.  He most recently completed blood work on 3/6 and was found to have a viral load that remains undetectable and a CD4 count of 470. He reports taking the Prezcobix and Descovy as prescribed with no adverse side effects. Since his most recent office visit he has had several visits to the ED and was admitted to the hospital with suicidal ideations. He continues to take trazodone and bupropion. He has been in rehabilitation where he has been receiving his medication.   Denies fevers, chills, night sweats, nausea, vomiting, diarrhea, neck pain/stiffness, headaches, lesions/rashes, or changes in vision.    Lab Results  Component Value Date   CD4TCELL 23 (L) 03/14/2017   CD4TABS 470 03/14/2017   Lab Results  Component Value Date   HIV1RNAQUANT <20 DETECTED (A) 03/14/2017     2.) Right knee pain - this is a new problem. He is having pain located in his right knee that has been going on for about 2 years. Pain is described as achy and exacerbated by the weather. There are occasional sharp pains. Modifying factors include Motrin which has helped on occasion. He is requesting to see an orthopedic. No specific trauma or injury.   Allergies  Allergen Reactions  . Shellfish Allergy Anaphylaxis and Swelling    Swelling everywhere  . Sulfa Antibiotics Anaphylaxis, Hives, Itching and Swelling    Swelling all over       Outpatient Medications Prior to Visit  Medication Sig Dispense Refill  . buPROPion (WELLBUTRIN XL) 300 MG 24 hr tablet Take 1 tablet (300 mg total) by mouth  every morning. For depression 10 tablet 0  . ibuprofen (ADVIL,MOTRIN) 800 MG tablet Take 1 tablet (800 mg total) by mouth every 8 (eight) hours as needed for headache, mild pain or moderate pain. 1 tablet 0  . darunavir-cobicistat (PREZCOBIX) 800-150 MG tablet Take 1 tablet by mouth daily. Swallow whole. Do NOT crush, break or chew tablets. Take with food, take with Descovy. 30 tablet 0  . emtricitabine-tenofovir AF (DESCOVY) 200-25 MG tablet Take 1 tablet by mouth daily. Take with Prezcobix. 30 tablet 0  . baclofen (LIORESAL) 10 MG tablet Take 1 tablet (10 mg total) by mouth 3 (three) times daily as needed for muscle spasms. (Patient not taking: Reported on 03/27/2017) 1 each 0  . traZODone (DESYREL) 50 MG tablet Take 1 tablet (50 mg total) by mouth at bedtime as needed for sleep. (Patient not taking: Reported on 03/27/2017) 30 tablet 0   No facility-administered medications prior to visit.      Past Medical History:  Diagnosis Date  . 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 Hemorrhoids   . HIV (human immunodeficiency virus infection) (HCC)      Past Surgical History:  Procedure Laterality Date  . HERNIA REPAIR  97   lt ing  . RECTAL EXAM UNDER ANESTHESIA N/A 04/24/2014   Procedure:  ligation of bleeding vessels;  Surgeon: [name] Lint, MD;  Location: [facility];  Service: General;  Laterality: N/A;  . TENDON REPAIR     2006-lt index  . WART FULGURATION  07/06/2011   Procedure: FULGURATION ANAL WART;  Surgeon: Shelly Rubensteinouglas A Blackman, MD;  Location: [facility];  Service: General;  Laterality: N/A;  excision anal condyloma  . WART FULGURATION N/A 04/23/2014   Procedure: EXCISION OF ANAL CONDYLOMA;  Surgeon: Abigail Miyamotoouglas Blackman, MD;  Location: [facility];  Service: General;  Laterality: N/A;     Review of Systems  Constitutional: Negative for activity change, appetite change, diaphoresis, fatigue, fever and unexpected weight change.  HENT: Negative for congestion, sinus pressure and sore throat.     Respiratory: Negative for cough, chest tightness, shortness of breath and wheezing.   Cardiovascular: Negative for chest pain and leg swelling.  Gastrointestinal: Negative for abdominal pain, constipation, diarrhea, nausea and vomiting.  Genitourinary: Negative for dysuria, flank pain, frequency, genital sores, hematuria and urgency.  Neurological: Negative for weakness and headaches.      Objective:    BP (!) 156/90 (BP Location: Right Arm, Patient Position: Sitting, Cuff Size: Normal)   Pulse 89   Temp 97.9 F (36.6 C) (Oral)   Ht 6\' 1"  (1.854 m)   Wt 163 lb 12 oz (74.3 kg)   BMI 21.60 kg/m  Nursing note and vital signs reviewed.  Physical Exam  Constitutional: He is oriented to person, place, and time. He appears well-developed and well-nourished. No distress.  HENT:  Mouth/Throat: Oropharynx is clear and moist. No oropharyngeal exudate.  Eyes: Conjunctivae are normal. Pupils are equal, round, and reactive to light.  Neck: Neck supple.  Cardiovascular: Normal rate, regular rhythm, normal heart sounds and intact distal pulses. Exam reveals no gallop and no friction rub.  No murmur heard. Pulmonary/Chest: Effort normal and breath sounds normal. No respiratory distress. He has no wheezes. He has no rales.  Abdominal: Soft. Bowel sounds are normal. He exhibits no distension. There is no tenderness.  Musculoskeletal:  Right knee - no obvious deformity, discoloration, or edema. Tenderness of the medial joint line with mild decrease in range of motion. Distal pulses and sensation are intact and appropriate.  Lymphadenopathy:    He has no cervical adenopathy.  Neurological: He is alert and oriented to person, place, and time.  Skin: Skin is warm and dry.  Psychiatric: He has a normal mood and affect. His behavior is normal. Judgment and thought content normal.       Assessment & Plan:   Problem List Items Addressed This Visit      Musculoskeletal and Integument    Tricompartment osteoarthritis of right knee    Progressively worsening knee pain with x-rays confirming tricompartmental arthritis and would likely benefit from an orthopedic evaluation and possible cortisone injection. Referral to orthopedics placed. In the meantime encouraged to continue to use ice and home exercise therapy pending referral.       Relevant Orders   Ambulatory referral to Orthopedics     Other   HIV disease (HCC) - Primary    Stable with current Descovy and Prezcobix as his viral load is undetectable and CD4 count is 470. No evidence of OI. Will check RPR with next blood work. Continue Descovy and Prezcobix with follow up labs and office visit in 6 months or sooner if needed.       Relevant Medications   emtricitabine-tenofovir AF (DESCOVY) 200-25 MG tablet   darunavir-cobicistat (PREZCOBIX) 800-150 MG tablet   Other Relevant Orders   HIV 1 RNA quant-no reflex-bld   T-helper  cell (CD4)- ([facility] clinic only)   RPR       I have discontinued Elmar Tromp's traZODone. I am also having him maintain his baclofen, buPROPion, ibuprofen, emtricitabine-tenofovir AF, and darunavir-cobicistat.   Meds ordered this encounter  Medications  . emtricitabine-tenofovir AF (DESCOVY) 200-25 MG tablet    Sig: Take 1 tablet by mouth daily. Take with Prezcobix.    Dispense:  30 tablet    Refill:  5    Order Specific Question:   Supervising Provider    Answer:   [name] [[id number]]  . darunavir-cobicistat (PREZCOBIX) 800-150 MG tablet    Sig: Take 1 tablet by mouth daily. Swallow whole. Do NOT crush, break or chew tablets. Take with food, take with Descovy.    Dispense:  30 tablet    Refill:  5    Will give additional refills at next office visit    Order Specific Question:   Supervising Provider    Answer:   [name] [[id number]]     Follow-up: Return in about 6 months (around 09/27/2017), or if symptoms worsen or fail to improve.   Marcos Eke, MSN, [facility]  [facility]

## 2017-03-27 NOTE — Assessment & Plan Note (Signed)
Stable with current Descovy and Prezcobix as his viral load is undetectable and CD4 count is 470. No evidence of OI. Will check RPR with next blood work. Continue Descovy and Prezcobix with follow up labs and office visit in 6 months or sooner if needed.

## 2017-03-27 NOTE — Assessment & Plan Note (Signed)
Progressively worsening knee pain with x-rays confirming tricompartmental arthritis and would likely benefit from an orthopedic evaluation and possible cortisone injection. Referral to orthopedics placed. In the meantime encouraged to continue to use ice and home exercise therapy pending referral.

## 2017-06-27 ENCOUNTER — Other Ambulatory Visit: Payer: Self-pay

## 2017-06-27 ENCOUNTER — Emergency Department (HOSPITAL_COMMUNITY): Payer: Medicare Other

## 2017-06-27 ENCOUNTER — Encounter (HOSPITAL_COMMUNITY): Payer: Self-pay

## 2017-06-27 ENCOUNTER — Inpatient Hospital Stay (HOSPITAL_COMMUNITY)
Admission: EM | Admit: 2017-06-27 | Discharge: 2017-06-29 | DRG: 552 | Disposition: A | Discharge status: Home / Self Care | Payer: Medicare Other | Attending: Internal Medicine | Admitting: Internal Medicine

## 2017-06-27 ENCOUNTER — Inpatient Hospital Stay (HOSPITAL_COMMUNITY): Payer: Medicare Other

## 2017-06-27 DIAGNOSIS — R531 Weakness: Secondary | ICD-10-CM | POA: Diagnosis not present

## 2017-06-27 DIAGNOSIS — Z79899 Other long term (current) drug therapy: Secondary | ICD-10-CM

## 2017-06-27 DIAGNOSIS — M1711 Unilateral primary osteoarthritis, right knee: Secondary | ICD-10-CM | POA: Diagnosis present

## 2017-06-27 DIAGNOSIS — R52 Pain, unspecified: Secondary | ICD-10-CM

## 2017-06-27 DIAGNOSIS — Z91013 Allergy to seafood: Secondary | ICD-10-CM | POA: Diagnosis not present

## 2017-06-27 DIAGNOSIS — F1014 Alcohol abuse with alcohol-induced mood disorder: Secondary | ICD-10-CM | POA: Diagnosis present

## 2017-06-27 DIAGNOSIS — Z882 Allergy status to sulfonamides status: Secondary | ICD-10-CM

## 2017-06-27 DIAGNOSIS — M4802 Spinal stenosis, cervical region: Principal | ICD-10-CM | POA: Diagnosis present

## 2017-06-27 DIAGNOSIS — R29898 Other symptoms and signs involving the musculoskeletal system: Secondary | ICD-10-CM

## 2017-06-27 DIAGNOSIS — Z538 Procedure and treatment not carried out for other reasons: Secondary | ICD-10-CM | POA: Diagnosis not present

## 2017-06-27 DIAGNOSIS — N179 Acute kidney failure, unspecified: Secondary | ICD-10-CM | POA: Diagnosis present

## 2017-06-27 DIAGNOSIS — G992 Myelopathy in diseases classified elsewhere: Secondary | ICD-10-CM | POA: Diagnosis present

## 2017-06-27 DIAGNOSIS — B20 Human immunodeficiency virus [HIV] disease: Secondary | ICD-10-CM | POA: Diagnosis present

## 2017-06-27 DIAGNOSIS — F329 Major depressive disorder, single episode, unspecified: Secondary | ICD-10-CM | POA: Diagnosis present

## 2017-06-27 DIAGNOSIS — F17210 Nicotine dependence, cigarettes, uncomplicated: Secondary | ICD-10-CM | POA: Diagnosis present

## 2017-06-27 DIAGNOSIS — R200 Anesthesia of skin: Secondary | ICD-10-CM | POA: Diagnosis present

## 2017-06-27 DIAGNOSIS — Y906 Blood alcohol level of 120-199 mg/100 ml: Secondary | ICD-10-CM | POA: Diagnosis present

## 2017-06-27 LAB — I-STAT CHEM 8, ED
BUN: 15 mg/dL (ref 6–20)
CHLORIDE: 102 mmol/L (ref 101–111)
CREATININE: 1.4 mg/dL — AB (ref 0.61–1.24)
Calcium, Ion: 1.22 mmol/L (ref 1.15–1.40)
GLUCOSE: 68 mg/dL (ref 65–99)
HEMATOCRIT: 48 % (ref 39.0–52.0)
Hemoglobin: 16.3 g/dL (ref 13.0–17.0)
POTASSIUM: 4.5 mmol/L (ref 3.5–5.1)
Sodium: 139 mmol/L (ref 135–145)
TCO2: 26 mmol/L (ref 22–32)

## 2017-06-27 LAB — COMPREHENSIVE METABOLIC PANEL
ALBUMIN: 5 g/dL (ref 3.5–5.0)
ALT: 27 U/L (ref 17–63)
AST: 32 U/L (ref 15–41)
Alkaline Phosphatase: 42 U/L (ref 38–126)
Anion gap: 8 (ref 5–15)
BUN: 17 mg/dL (ref 6–20)
CHLORIDE: 104 mmol/L (ref 101–111)
CO2: 29 mmol/L (ref 22–32)
CREATININE: 1.19 mg/dL (ref 0.61–1.24)
Calcium: 9.8 mg/dL (ref 8.9–10.3)
GFR calc Af Amer: >60 mL/min (ref >60)
GFR calc non Af Amer: >60 mL/min (ref >60)
GLUCOSE: 72 mg/dL (ref 65–99)
POTASSIUM: 4.5 mmol/L (ref 3.5–5.1)
Sodium: 141 mmol/L (ref 135–145)
Total Bilirubin: 1.4 mg/dL — ABNORMAL HIGH (ref 0.3–1.2)
Total Protein: 8.8 g/dL — ABNORMAL HIGH (ref 6.5–8.1)

## 2017-06-27 LAB — RAPID URINE DRUG SCREEN, HOSP PERFORMED
AMPHETAMINES: NOT DETECTED
BENZODIAZEPINES: NOT DETECTED
COCAINE: NOT DETECTED
Opiates: NOT DETECTED
TETRAHYDROCANNABINOL: NOT DETECTED

## 2017-06-27 LAB — URINALYSIS, ROUTINE W REFLEX MICROSCOPIC
BILIRUBIN URINE: NEGATIVE
Glucose, UA: NEGATIVE mg/dL
HGB URINE DIPSTICK: NEGATIVE
KETONES UR: NEGATIVE mg/dL
Leukocytes, UA: NEGATIVE
Nitrite: NEGATIVE
PROTEIN: NEGATIVE mg/dL
Specific Gravity, Urine: 1.003 — ABNORMAL LOW (ref 1.005–1.030)
pH: 5 (ref 5.0–8.0)

## 2017-06-27 LAB — DIFFERENTIAL
BASOS ABS: 0 10*3/uL (ref 0.0–0.1)
BASOS PCT: 0 %
EOS ABS: 0.1 10*3/uL (ref 0.0–0.7)
Eosinophils Relative: 1 %
LYMPHS ABS: 2.2 10*3/uL (ref 0.7–4.0)
Lymphocytes Relative: 39 %
Monocytes Absolute: 0.5 10*3/uL (ref 0.1–1.0)
Monocytes Relative: 8 %
NEUTROS ABS: 2.9 10*3/uL (ref 1.7–7.7)
NEUTROS PCT: 52 %

## 2017-06-27 LAB — PROTIME-INR
INR: 0.93
Prothrombin Time: 12.4 seconds (ref 11.4–15.2)

## 2017-06-27 LAB — CBC
HEMATOCRIT: 43.5 % (ref 39.0–52.0)
HEMOGLOBIN: 14.5 g/dL (ref 13.0–17.0)
MCH: 28.9 pg (ref 26.0–34.0)
MCHC: 33.3 g/dL (ref 30.0–36.0)
MCV: 86.7 fL (ref 78.0–100.0)
Platelets: 295 10*3/uL (ref 150–400)
RBC: 5.02 MIL/uL (ref 4.22–5.81)
RDW: 14.1 % (ref 11.5–15.5)
WBC: 5.6 10*3/uL (ref 4.0–10.5)

## 2017-06-27 LAB — HEMOGLOBIN A1C
HEMOGLOBIN A1C: 6 % — AB (ref 4.8–5.6)
MEAN PLASMA GLUCOSE: 125.5 mg/dL

## 2017-06-27 LAB — TSH: TSH: 1.47 u[IU]/mL (ref 0.350–4.500)

## 2017-06-27 LAB — ETHANOL: Alcohol, Ethyl (B): 140 mg/dL — ABNORMAL HIGH (ref <10)

## 2017-06-27 LAB — APTT: APTT: 29 s (ref 24–36)

## 2017-06-27 LAB — VITAMIN B12: VITAMIN B 12: 283 pg/mL (ref 180–914)

## 2017-06-27 LAB — I-STAT TROPONIN, ED: Troponin i, poc: 0 ng/mL (ref 0.00–0.08)

## 2017-06-27 MED ORDER — EMTRICITABINE-TENOFOVIR AF 200-25 MG PO TABS
1.0000 | ORAL_TABLET | Freq: Every day | ORAL | Status: DC
  Administered 2017-06-28 – 2017-06-29 (×2): 1 via ORAL
  Filled 2017-06-27 (×3): qty 1

## 2017-06-27 MED ORDER — THIAMINE HCL 100 MG/ML IJ SOLN: 100.0000 mg | Freq: Every day | INTRAMUSCULAR | Status: DC

## 2017-06-27 MED ORDER — ADULT MULTIVITAMIN W/MINERALS CH
1.0000 | ORAL_TABLET | Freq: Every day | ORAL | Status: DC
  Administered 2017-06-28 – 2017-06-29 (×2): 1 via ORAL
  Filled 2017-06-27 (×2): qty 1

## 2017-06-27 MED ORDER — LORAZEPAM 2 MG/ML IJ SOLN: 1.0000 mg | Freq: Once | INTRAMUSCULAR | Status: DC

## 2017-06-27 MED ORDER — ACETAMINOPHEN 325 MG PO TABS: 650.0000 mg | ORAL_TABLET | Freq: Four times a day (QID) | ORAL | Status: DC | PRN

## 2017-06-27 MED ORDER — ONDANSETRON HCL 4 MG/2ML IJ SOLN: 4.0000 mg | Freq: Four times a day (QID) | INTRAMUSCULAR | Status: DC | PRN

## 2017-06-27 MED ORDER — LORAZEPAM 1 MG PO TABS: 1.0000 mg | ORAL_TABLET | Freq: Four times a day (QID) | ORAL | Status: DC | PRN

## 2017-06-27 MED ORDER — ONDANSETRON HCL 4 MG PO TABS: 4.0000 mg | ORAL_TABLET | Freq: Four times a day (QID) | ORAL | Status: DC | PRN

## 2017-06-27 MED ORDER — TRAMADOL HCL 50 MG PO TABS
50.0000 mg | ORAL_TABLET | Freq: Four times a day (QID) | ORAL | Status: DC | PRN
  Administered 2017-06-28 – 2017-06-29 (×3): 50 mg via ORAL
  Filled 2017-06-27 (×3): qty 1

## 2017-06-27 MED ORDER — FOLIC ACID 1 MG PO TABS
1.0000 mg | ORAL_TABLET | Freq: Every day | ORAL | Status: DC
  Administered 2017-06-28 – 2017-06-29 (×2): 1 mg via ORAL
  Filled 2017-06-27 (×2): qty 1

## 2017-06-27 MED ORDER — BUPROPION HCL ER (XL) 150 MG PO TB24
300.0000 mg | ORAL_TABLET | Freq: Every day | ORAL | Status: DC
  Administered 2017-06-28 – 2017-06-29 (×2): 300 mg via ORAL
  Filled 2017-06-27: qty 2
  Filled 2017-06-27: qty 1

## 2017-06-27 MED ORDER — VITAMIN B-1 100 MG PO TABS
100.0000 mg | ORAL_TABLET | Freq: Every day | ORAL | Status: DC
  Administered 2017-06-28 – 2017-06-29 (×2): 100 mg via ORAL
  Filled 2017-06-27 (×2): qty 1

## 2017-06-27 MED ORDER — ACETAMINOPHEN 650 MG RE SUPP: 650.0000 mg | Freq: Four times a day (QID) | RECTAL | Status: DC | PRN

## 2017-06-27 MED ORDER — ENOXAPARIN SODIUM 40 MG/0.4ML ~~LOC~~ SOLN
40.0000 mg | SUBCUTANEOUS | Status: DC
  Administered 2017-06-27: 40 mg via SUBCUTANEOUS
  Filled 2017-06-27: qty 0.4

## 2017-06-27 MED ORDER — LORAZEPAM 2 MG/ML IJ SOLN
1.0000 mg | Freq: Four times a day (QID) | INTRAMUSCULAR | Status: DC | PRN
  Administered 2017-06-27: 1 mg via INTRAVENOUS
  Filled 2017-06-27: qty 1

## 2017-06-27 MED ORDER — DARUNAVIR-COBICISTAT 800-150 MG PO TABS
1.0000 | ORAL_TABLET | Freq: Every day | ORAL | Status: DC
  Administered 2017-06-28 – 2017-06-29 (×2): 1 via ORAL
  Filled 2017-06-27 (×3): qty 1

## 2017-06-27 MED ORDER — SENNOSIDES-DOCUSATE SODIUM 8.6-50 MG PO TABS: 1.0000 | ORAL_TABLET | Freq: Every evening | ORAL | Status: DC | PRN

## 2017-06-27 NOTE — ED Notes (Signed)
ED TO INPATIENT HANDOFF REPORT  Name/Age/Gender [name] 57 y.o. male  Code Status Code Status History    Date Active Date Inactive Code Status Order ID Comments User Context   09/25/2016 0242 09/25/2016 1709 Full Code [id number]  Doristine Devoid, PA-C ED   09/25/2016 0105 09/25/2016 0242 Full Code [id number]  Monico Blitz, PA-C ED   09/18/2016 2306 09/21/2016 1952 Full Code [id number]  Vicenta Aly, NP Inpatient   09/18/2016 0224 09/18/2016 2303 Full Code [id number]  Montine Circle, PA-C ED   04/23/2014 1119 04/26/2014 1514 Full Code [id number]  Coralie Keens, MD Inpatient      Home/SNF/Other Home  Chief Complaint left side numbness/right leg pain  Level of Care/Admitting Diagnosis ED Disposition    ED Disposition Condition [facility] Area: [facility] [[id number]]  Level of Care: Telemetry [5]  Admit to tele based on following criteria: Other see comments  Comments: stroke work up  Diagnosis: Weakness of left hand [315176]  Admitting Physician: Dessa Phi [[id number]]  Attending Physician: Dessa Phi [phone]  Estimated length of stay: past midnight tomorrow  Certification:: I certify this patient will need inpatient services for at least 2 midnights  PT Class (Do Not Modify): Inpatient [101]  PT Acc Code (Do Not Modify): Private [1]       Medical History Past Medical History:  Diagnosis Date  . 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 Hemorrhoids   . HIV (human immunodeficiency virus infection) (B and E)     Allergies Allergies  Allergen Reactions  . Shellfish Allergy Anaphylaxis and Swelling    Swelling everywhere  . Sulfa Antibiotics Anaphylaxis, Hives, Itching and Swelling    Swelling all over     IV Location/Drains/Wounds Patient Lines/Drains/Airways Status   Active Line/Drains/Airways    Name:   Placement date:   Placement time:   Site:   Days:   Peripheral IV 09/24/16 Left Hand   09/24/16    2332    Hand   276    Peripheral IV 06/27/17 Right Antecubital   06/27/17    1331    Antecubital   less than 1   Incision (Closed) 04/23/14 Rectum Other (Comment)   04/23/14    0919     1161   Incision (Closed) 04/24/14 Rectum   04/24/14    0205     1160   Wound Fissure Rectum RN applied 4x4 repeatedly after using the bathroom. Mod saturation of blood noted   -    -    Rectum             Labs/Imaging Results for orders placed or performed during the hospital encounter of 06/27/17 (from the past 48 hour(s))  Ethanol     Status: Abnormal   Collection Time: 06/27/17  1:30 PM  Result Value Ref Range   Alcohol, Ethyl (B) 140 (H) <10 mg/dL    Comment: (NOTE) Lowest detectable limit for serum alcohol is 10 mg/dL. For medical purposes only. Performed at [facility], [place] [address], [place], [place] [address]   Protime-INR     Status: None   Collection Time: 06/27/17  1:30 PM  Result Value Ref Range   Prothrombin Time 12.4 11.4 - 15.2 seconds   INR 0.93     Comment: Performed at [facility], [place] [address] [place], [place] [address]  APTT     Status: None   Collection Time: 06/27/17  1:30 PM  Result Value Ref Range  aPTT 29 24 - 36 seconds    Comment: Performed at [facility], [place] [address], [place], [place] [address]  CBC     Status: None   Collection Time: 06/27/17  1:30 PM  Result Value Ref Range   WBC 5.6 4.0 - 10.5 K/uL   RBC 5.02 4.22 - 5.81 MIL/uL   Hemoglobin 14.5 13.0 - 17.0 g/dL   HCT 43.5 39.0 - 52.0 %   MCV 86.7 78.0 - 100.0 fL   MCH 28.9 26.0 - 34.0 pg   MCHC 33.3 30.0 - 36.0 g/dL   RDW 14.1 11.5 - 15.5 %   Platelets 295 150 - 400 K/uL    Comment: Performed at [facility], [place] [address] [place], [place] [address]  Differential     Status: None   Collection Time: 06/27/17  1:30 PM  Result Value Ref Range   Neutrophils Relative % 52 %   Neutro Abs 2.9 1.7 - 7.7 K/uL   Lymphocytes Relative 39 %   Lymphs  Abs 2.2 0.7 - 4.0 K/uL   Monocytes Relative 8 %   Monocytes Absolute 0.5 0.1 - 1.0 K/uL   Eosinophils Relative 1 %   Eosinophils Absolute 0.1 0.0 - 0.7 K/uL   Basophils Relative 0 %   Basophils Absolute 0.0 0.0 - 0.1 K/uL    Comment: Performed at [facility], [place] [address] [place], [place] [address]  Comprehensive metabolic panel     Status: Abnormal   Collection Time: 06/27/17  1:30 PM  Result Value Ref Range   Sodium 141 135 - 145 mmol/L   Potassium 4.5 3.5 - 5.1 mmol/L   Chloride 104 101 - 111 mmol/L   CO2 29 22 - 32 mmol/L   Glucose, Bld 72 65 - 99 mg/dL   BUN 17 6 - 20 mg/dL   Creatinine, Ser 1.19 0.61 - 1.24 mg/dL   Calcium 9.8 8.9 - 10.3 mg/dL   Total Protein 8.8 (H) 6.5 - 8.1 g/dL   Albumin 5.0 3.5 - 5.0 g/dL   AST 32 15 - 41 U/L   ALT 27 17 - 63 U/L   Alkaline Phosphatase 42 38 - 126 U/L   Total Bilirubin 1.4 (H) 0.3 - 1.2 mg/dL   GFR calc non Af Amer >60 >60 mL/min   GFR calc Af Amer >60 >60 mL/min    Comment: (NOTE) The eGFR has been calculated using the CKD EPI equation. This calculation has not been validated in all clinical situations. eGFR's persistently <60 mL/min signify possible Chronic Kidney Disease.    Anion gap 8 5 - 15    Comment: Performed at [facility], [place] [address], [place],  [address]  I-stat troponin, ED     Status: None   Collection Time: 06/27/17  1:34 PM  Result Value Ref Range   Troponin i, poc 0.00 0.00 - 0.08 ng/mL   Comment 3            Comment: Due to the release kinetics of cTnI, a negative result within the first hours of the onset of symptoms does not rule out myocardial infarction with certainty. If myocardial infarction is still suspected, repeat the test at appropriate intervals.   I-Stat Chem 8, ED     Status: Abnormal   Collection Time: 06/27/17  1:36 PM  Result Value Ref Range   Sodium 139 135 - 145 mmol/L   Potassium 4.5 3.5 - 5.1 mmol/L  Chloride 102 101 - 111 mmol/L    BUN 15 6 - 20 mg/dL   Creatinine, Ser 1.40 (H) 0.61 - 1.24 mg/dL   Glucose, Bld 68 65 - 99 mg/dL   Calcium, Ion 1.22 1.15 - 1.40 mmol/L   TCO2 26 22 - 32 mmol/L   Hemoglobin 16.3 13.0 - 17.0 g/dL   HCT 48.0 39.0 - 52.0 %  Urine rapid drug screen (hosp performed)     Status: Abnormal   Collection Time: 06/27/17  2:29 PM  Result Value Ref Range   Opiates NONE DETECTED NONE DETECTED   Cocaine NONE DETECTED NONE DETECTED   Benzodiazepines NONE DETECTED NONE DETECTED   Amphetamines NONE DETECTED NONE DETECTED   Tetrahydrocannabinol NONE DETECTED NONE DETECTED   Barbiturates (A) NONE DETECTED    Result not available. Reagent lot number recalled by manufacturer.    Comment: Performed at [facility], [place] [address], [place], [place] [address]  Urinalysis, Routine w reflex microscopic     Status: Abnormal   Collection Time: 06/27/17  2:29 PM  Result Value Ref Range   Color, Urine STRAW (A) YELLOW   APPearance CLEAR CLEAR   Specific Gravity, Urine 1.003 (L) 1.005 - 1.030   pH 5.0 5.0 - 8.0   Glucose, UA NEGATIVE NEGATIVE mg/dL   Hgb urine dipstick NEGATIVE NEGATIVE   Bilirubin Urine NEGATIVE NEGATIVE   Ketones, ur NEGATIVE NEGATIVE mg/dL   Protein, ur NEGATIVE NEGATIVE mg/dL   Nitrite NEGATIVE NEGATIVE   Leukocytes, UA NEGATIVE NEGATIVE    Comment: Performed at [place] [address] [place], [place] [address]   Ct Head Wo Contrast  Result Date: 06/27/2017 CLINICAL DATA:  57 year old male with left side numbness and weakness 4 days. Hand weakness. HIV positive. EXAM: CT HEAD WITHOUT CONTRAST TECHNIQUE: Contiguous axial images were obtained from the base of the skull through the vertex without intravenous contrast. COMPARISON:  Head CTs 09/25/2016 and earlier. FINDINGS: Brain: Cerebral volume is stable and within normal limits. No midline shift, ventriculomegaly, mass effect, evidence of mass lesion, intracranial hemorrhage or evidence of  cortically based acute infarction. Gray-white matter differentiation throughout the brain appears stable and within normal limits. No cortical encephalomalacia identified. Vascular: No suspicious intracranial vascular hyperdensity. Skull: Negative. Sinuses/Orbits: Visualized paranasal sinuses and mastoids are stable and well pneumatized. Other: Visualized orbits and scalp soft tissues are within normal limits. IMPRESSION: Stable and normal noncontrast CT appearance of the brain. No acute intracranial abnormality identified. Electronically Signed   By: Genevie Ann M.D.   On: 06/27/2017 14:47    Pending Labs [employer] (From admission, onward)   Start     Ordered   Signed and Held  CBC  (enoxaparin (LOVENOX)    CrCl >/= 30 ml/min)  Once,   R    Comments:  Baseline for enoxaparin therapy IF NOT ALREADY DRAWN.  Notify MD if PLT < 100 K.    Signed and Held   Signed and Held  Creatinine, serum  (enoxaparin (LOVENOX)    CrCl >/= 30 ml/min)  Once,   R    Comments:  Baseline for enoxaparin therapy IF NOT ALREADY DRAWN.    Signed and Held   Signed and Held  Creatinine, serum  (enoxaparin (LOVENOX)    CrCl >/= 30 ml/min)  Weekly,   R    Comments:  while on enoxaparin therapy    Signed and Held   Signed and Held  CBC  Tomorrow morning,  R     Signed and Held   Signed and Held  Basic metabolic panel  Tomorrow morning,   R     Signed and Held   Signed and Held  Hemoglobin A1c  Once,   R     Signed and Held   Signed and Held  TSH  Once,   R     Signed and Held   Signed and Held  Vitamin B12  Once,   R     Signed and Held   Signed and Held  RPR  Once,   R     Signed and Held      Vitals/Pain Today's Vitals   06/27/17 1320 06/27/17 1330 06/27/17 1338 06/27/17 1400  BP:  (!) 130/92  131/88  Pulse:    69  Resp:  16  18  Temp:   98 F (36.7 C)   TempSrc:      SpO2:    98%  Weight:      Height:      PainSc: 10-Worst pain ever       Isolation Precautions No active  isolations  Medications Medications - No data to display  Mobility walks with device

## 2017-06-27 NOTE — ED Triage Notes (Addendum)
Patient reports that he has had left sided numbness and weakness x 4 days. Patient states when he tries to smoke he has had the cigarette fall because he was unable to grip the cigarette. Patient also c/o right knee pain.

## 2017-06-27 NOTE — ED Notes (Signed)
Pt's urine sample along with urine culture sent to lab at this time.

## 2017-06-27 NOTE — Consult Note (Signed)
   [facility] Tele[facility]urology Consult Services  Date of Service: 06/27/17  Impression:  Acute left face, arm, and leg weakness/numbness - concerning for right parietal stroke. ?lack of involvement of the face also raises suspicion for C spine lesion. Recommend stat MRI brain and C spine. Will need further workup based upon the results.   Recommendations:  MRI brain and C spine May need further workup based upon the results Inpatient neuro eval Discussed with ED staff  ---------------------------------------------------------------------  CC: left sided numbness/weakness  History of Present Illness:  57 yo man with a hx of HIV and etoh abuse that presents with L sided weakness/numbness starting approx 5 days ago. He has a chronically weak right leg. He notes that he has been dropping his phone and cigarettes from the left hand. He also notes numbness on the left face. No urinary incontinence but he has had urgency with some close calls. No LOC/convulsion. No trouble speaking/swallowing. No vision changes. He takes not thinners.   Diagnostic Testing:  CT head unremarkable  Vital Signs:   Vitals:   06/27/17 1330 06/27/17 1338  BP: (!) 130/92   Pulse:    Resp: 16   Temp:  98 F (36.7 C)  SpO2:       Exam:  Mental Status:  Awake, alert, oriented  Naming: Intact Repetition: Intact   Speech: fluent  Cranial Nerves:  Pupils: Equal round and reactive to light Extraocular movements: Intact in all cardinal gaze Ptosis: Absent Visual fields: Intact to finger counting Facial sensation: Intact to pin and light touch Facial movements: Intact and symmetric    Motor Exam:  No drift in in B UE: able to hold B LE against gravity, but strained.  Tremor/Abnormal Movements:  Resting tremor: Absent Intention tremor: Absent Postural tremor: Absent  Sensory Exam:   Light touch: Intact    Coordination:   Finger to nose: Intact  Medical Decision Making:  - Extensive number of  diagnosis or management options are considered above.   - Extensive amount of complex data reviewed.   - High risk of complication and/or morbidity or mortality are associated with differential diagnostic considerations above.  - There may be uncertain outcome and increased probability of prolonged functional impairment or high probability of severe prolonged functional impairment associated with some of these differential diagnosis.   Medical Data Reviewed:  1.Data reviewed include clinical labs, radiology,  Medical Tests;   2.Tests results discussed w/performing or interpreting physician;   3.Obtaining/reviewing old medical records;  4.Obtaining case history from another source;  5.Independent review of image, tracing or specimen.    Patient was informed the Neurology Consult would happen via TeleHealth consult by way of interactive audio and video telecommunications and consented to receiving care in this manner.

## 2017-06-27 NOTE — ED Notes (Signed)
Teleneuro assessment in progress ?

## 2017-06-27 NOTE — H&P (Signed)
History and Physical    [name] [id number] DOB: 08/05/1960 DOA: 06/27/2017  PCP: [name], MD  Patient coming from: Home  Chief Complaint: 5 days of left sided weakness, numbness   HPI: [name] is a 57 y.o. male with medical history significant of HIV and right knee OA who presents with 5 day history of left sided numbness, tingling, weakness.  He states that the numbness begins from left side of his neck that radiates down his left arm and into all 5 fingers of his left hand.  He also notes numbness in the entire left leg.  He admits to weakness of the left upper extremity and left lower extremity.  He states that he has a difficult time lifting his left leg to ambulate and has been needing to use a friend's cane for the past 5 days.  He has a chronic right knee osteoarthritis and right leg weakness that is chronic in nature.  He has no other complaints of slurred speech, visual deficits.  Denies any recent illnesses, denies any fevers, chills, chest pain, cough, shortness of breath, nausea, vomiting, diarrhea, abdominal pain.  He states that in the past 5 days, he has fallen because of weakness of his left lower extremity.  ED Course: CT head was stable with no acute intracranial abnormality seen. Teleneurology evaluated patient and recommends MRI brain and c spine which has been ordered and pending at this time.   Review of Systems: As per HPI otherwise 10 point review of systems negative.   Past Medical History:  Diagnosis Date  . 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 Hemorrhoids   . HIV (human immunodeficiency virus infection) (HCC)     Past Surgical History:  Procedure Laterality Date  . HERNIA REPAIR  97   lt ing  . RECTAL EXAM UNDER ANESTHESIA N/A 04/24/2014   Procedure:  ligation of bleeding vessels;  Surgeon: Almond Lint, MD;  Location: [facility];  Service: General;  Laterality: N/A;  . TENDON REPAIR     2006-lt index  . WART FULGURATION  07/06/2011   Procedure:  FULGURATION ANAL WART;  Surgeon: Shelly Rubenstein, MD;  Location: [facility];  Service: General;  Laterality: N/A;  excision anal condyloma  . WART FULGURATION N/A 04/23/2014   Procedure: EXCISION OF ANAL CONDYLOMA;  Surgeon: Abigail Miyamoto, MD;  Location: [facility];  Service: General;  Laterality: N/A;     reports that he has been smoking cigarettes.  He has a 12.50 pack-year smoking history. He has never used smokeless tobacco. He reports that he drinks about 4.2 - 4.8 oz of alcohol per week. He reports that he does not use drugs.  Allergies  Allergen Reactions  . Shellfish Allergy Anaphylaxis and Swelling    Swelling everywhere  . Sulfa Antibiotics Anaphylaxis, Hives, Itching and Swelling    Swelling all over     Family History  Problem Relation Age of Onset  . Cancer Mother        brain  . Cancer Sister        breast    Prior to Admission medications   Medication Sig Start Date End Date Taking? Authorizing Provider  buPROPion (WELLBUTRIN XL) 300 MG 24 hr tablet Take 1 tablet (300 mg total) by mouth every morning. For depression 09/21/16  Yes Nwoko, Nicole Kindred I, NP  darunavir-cobicistat (PREZCOBIX) 800-150 MG tablet Take 1 tablet by mouth daily. Swallow whole. Do NOT crush, break or chew tablets. Take with food, take with Descovy. 03/27/17  Yes Veryl Speak, FNP  diphenhydrAMINE (BENADRYL) 50 MG tablet Take 100 mg by mouth at bedtime as needed for sleep.   Yes [provider]  emtricitabine-tenofovir AF (DESCOVY) 200-25 MG tablet Take 1 tablet by mouth daily. Take with Prezcobix. 03/27/17  Yes Veryl Speak, FNP  ibuprofen (ADVIL,MOTRIN) 800 MG tablet Take 1 tablet (800 mg total) by mouth every 8 (eight) hours as needed for headache, mild pain or moderate pain. 09/21/16  Yes Armandina Stammer I, NP  baclofen (LIORESAL) 10 MG tablet Take 1 tablet (10 mg total) by mouth 3 (three) times daily as needed for muscle spasms. Patient not taking: Reported on 03/27/2017  09/21/16   Armandina Stammer I, NP    Physical Exam: Vitals:   06/27/17 1300 06/27/17 1330 06/27/17 1338 06/27/17 1400  BP: (!) 134/94 (!) 130/92  131/88  Pulse: 82   69  Resp: (!) 25 16  18   Temp:   98 F (36.7 C)   TempSrc:      SpO2: 98%   98%  Weight:      Height:        Constitutional: NAD, calm, comfortable Eyes: PERRL, lids and conjunctivae normal ENMT: Mucous membranes are moist. Posterior pharynx clear of any exudate or lesions.Normal dentition.  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 Normal respiratory effort. No accessory muscle use.  Cardiovascular: Regular rate and rhythm, no murmurs / rubs / gallops. No extremity edema.  Abdomen: no tenderness, no masses palpated. No hepatosplenomegaly. Bowel sounds positive.  Musculoskeletal: no clubbing / cyanosis. No joint deformity upper and lower extremities. No contractures. Normal muscle tone.  Skin: no rashes, lesions, ulcers. No induration Neurologic: CN 2-12 grossly intact. +LUE with diminished sensation compared to RUE. Strength LUE 5/5, strength RUE 5/5 on exam. Diminished grip strength of left hand. Strength LLE 3/5, strength RLE 4/5 on exam.  Psychiatric: Normal judgment and insight. Alert and oriented x 3. Normal mood.   Labs on Admission: I have personally reviewed following labs and imaging studies  CBC: Recent Labs  Lab 06/27/17 1330 06/27/17 1336  WBC 5.6  --   NEUTROABS 2.9  --   HGB 14.5 16.3  HCT 43.5 48.0  MCV 86.7  --   PLT 295  --    Basic Metabolic Panel: Recent Labs  Lab 06/27/17 1330 06/27/17 1336  NA 141 139  K 4.5 4.5  CL 104 102  CO2 29  --   GLUCOSE 72 68  BUN 17 15  CREATININE 1.19 1.40*  CALCIUM 9.8  --    GFR: Estimated Creatinine Clearance: 64.7 mL/min (A) (by C-G formula based on SCr of 1.4 mg/dL  (H)). Liver Function Tests: Recent Labs  Lab 06/27/17 1330  AST 32  ALT 27  ALKPHOS 42  BILITOT 1.4*  PROT 8.8*  ALBUMIN 5.0   No results for input(s): LIPASE, AMYLASE in the last 168 hours. No results for input(s): AMMONIA in the last 168 hours. Coagulation Profile: Recent Labs  Lab 06/27/17 1330  INR 0.93   Cardiac Enzymes: No results for input(s): CKTOTAL, CKMB, CKMBINDEX, TROPONINI in the last 168 hours. BNP (last 3 results) No results for input(s): PROBNP in the last 8760 hours. HbA1C: No results for input(s): HGBA1C in the last 72 hours. CBG: No results for input(s): GLUCAP in the last 168 hours. Lipid Profile: No  results for input(s): CHOL, HDL, LDLCALC, TRIG, CHOLHDL, LDLDIRECT in the last 72 hours. Thyroid Function Tests: No results for input(s): TSH, T4TOTAL, FREET4, T3FREE, THYROIDAB in the last 72 hours. Anemia Panel: No results for input(s): VITAMINB12, FOLATE, FERRITIN, TIBC, IRON, RETICCTPCT in the last 72 hours. Urine analysis:    Component Value Date/Time   COLORURINE STRAW (A) 06/27/2017 1429   APPEARANCEUR CLEAR 06/27/2017 1429   LABSPEC 1.003 (L) 06/27/2017 1429   PHURINE 5.0 06/27/2017 1429   GLUCOSEU NEGATIVE 06/27/2017 1429   HGBUR NEGATIVE 06/27/2017 1429   BILIRUBINUR NEGATIVE 06/27/2017 1429   KETONESUR NEGATIVE 06/27/2017 1429   PROTEINUR NEGATIVE 06/27/2017 1429   UROBILINOGEN 0.2 05/08/2011 0915   NITRITE NEGATIVE 06/27/2017 1429   LEUKOCYTESUR NEGATIVE 06/27/2017 1429   Sepsis Labs: !!!!!!!!!!!!!!!!!!!!!!!!!!!!!!!!!!!!!!!!!!!! @LABRCNTIP (procalcitonin:4,lacticidven:4) )No results found for this or any previous visit (from the past 240 hour(s)).   Radiological Exams on Admission: Ct Head Wo Contrast  Result Date: 06/27/2017 CLINICAL DATA:  57 year old male with left side numbness and weakness 4 days. Hand weakness. HIV positive. EXAM: CT HEAD WITHOUT CONTRAST TECHNIQUE: Contiguous axial images were obtained from the base of the  skull through the vertex without intravenous contrast. COMPARISON:  Head CTs 09/25/2016 and earlier. FINDINGS: Brain: Cerebral volume is stable and within normal limits. No midline shift, ventriculomegaly, mass effect, evidence of mass lesion, intracranial hemorrhage or evidence of cortically based acute infarction. Gray-white matter differentiation throughout the brain appears stable and within normal limits. No cortical encephalomalacia identified. Vascular: No suspicious intracranial vascular hyperdensity. Skull: Negative. Sinuses/Orbits: Visualized paranasal sinuses and mastoids are stable and well pneumatized. Other: Visualized orbits and scalp soft tissues are within normal limits. IMPRESSION: Stable and normal noncontrast CT appearance of the brain. No acute intracranial abnormality identified. Electronically Signed   By: Odessa FlemingH  Hall M.D.   On: 06/27/2017 14:47    EKG: Independently reviewed. Normal sinus rhythm with LVH   Assessment/Plan Principal Problem:   Left-sided weakness Active Problems:   HIV disease (HCC)   Alcohol abuse with alcohol-induced mood disorder (HCC)   Left sided numbness and weakness -CT head negative  -TeleNeuro evaluated patient in ED, recommend MRI brain and C-spine -Depending on test results, [facility] if +stroke  -Check RPR, vit B12, TSH, Ha1c  -PT OT   HIV -Followed by Dr. Drue SecondSnider ([place]) -Continue prezcobix, descovy   Alcohol abuse -EtOH 140 on admission  -CIWA   Depression -Continue wellbutrin     DVT prophylaxis: Lovenox Code Status: Full  Family Communication: No family at bedside Disposition Plan: Pending further work up CiscoConsults called: Tele Neurology called by EDP  Admission status: Inpatient    * I certify that at the point of admission it is my clinical judgment that the patient will require inpatient hospital care spanning beyond 2 midnights from the point of admission due to high intensity of service, high  risk for further deterioration and high frequency of surveillance required.*   Noralee StainJennifer Zafir Schauer, DO [facility] [email or web address] Password [email or web address] 06/27/2017, 4:16 PM

## 2017-06-27 NOTE — ED Notes (Signed)
Writer informed Selena BattenKim, RN about delay in report/ transport due to pt being in MRI at this time.

## 2017-06-27 NOTE — ED Provider Notes (Signed)
[facility]-EMERGENCY DEPT Provider Note   CSN: [id number] Arrival date & time: 06/27/17  1121     History   Chief Complaint Chief Complaint  Patient presents with  . left sided numbness  . Knee Pain    HPI [name] is a 57 y.o. male.  HPI  56 year old male with history of HIV and alcohol abuse presents with weakness on the left side.  He is complained of left-sided weakness and numbness.  States this started about 5 days ago.  He states that grip strength on his left hand is severely limited and he has been dropping his phone and cigarettes.  He notes diffuse numbness and tingling from his left shoulder down to his hand as well as in his left leg.  He has chronic right leg weakness that he states is been attributed to arthritis.  This is been ongoing for about 2 years.  However now he is having a hard time moving his left leg.  He denies a headache.  He states he is having some left-sided neck pain that has been going on the similar time.  Also some left low back pain during this time.  No fevers.  He has chronic right lateral neck swelling that is unchanged.  He denies any chest pain or shortness of breath.  No abdominal pain.  No slurred speech.  Past Medical History:  Diagnosis Date  . 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 Hemorrhoids   . HIV (human immunodeficiency virus infection) [facility])     Patient Active Problem List   Diagnosis Date Noted  . Left-sided weakness 06/27/2017  . Tricompartment osteoarthritis of right knee 03/27/2017  . Alcohol abuse with alcohol-induced mood disorder (HCC) 09/25/2016  . Polysubstance dependence (HCC) 09/25/2016  . Substance use disorder 09/19/2016  . 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 HIV disease (HCC) 11/09/2008    Past Surgical History:  Procedure Laterality Date  . HERNIA REPAIR  97   lt ing  . RECTAL EXAM UNDER ANESTHESIA N/A  04/24/2014   Procedure:  ligation of bleeding vessels;  Surgeon: Almond Lint, MD;  Location: [facility];  Service: General;  Laterality: N/A;  . TENDON REPAIR     2006-lt index  . WART FULGURATION  07/06/2011   Procedure: FULGURATION ANAL WART;  Surgeon: Shelly Rubenstein, MD;  Location: [facility];  Service: General;  Laterality: N/A;  excision anal condyloma  . WART FULGURATION N/A 04/23/2014   Procedure: EXCISION OF ANAL CONDYLOMA;  Surgeon: Abigail Miyamoto, MD;  Location: [facility];  Service: General;  Laterality: N/A;        Home Medications    Prior to Admission medications   Medication Sig Start Date End Date Taking? Authorizing Provider  buPROPion (WELLBUTRIN XL) 300 MG 24 hr tablet Take 1 tablet (300 mg total) by mouth every morning. For depression 09/21/16  Yes Nwoko, Nicole Kindred I, NP  darunavir-cobicistat (PREZCOBIX) 800-150 MG tablet Take 1 tablet by mouth daily. Swallow whole. Do NOT crush, break or chew tablets. Take with food, take with Descovy. 03/27/17  Yes Veryl Speak, FNP  diphenhydrAMINE (BENADRYL) 50 MG tablet Take 100 mg by mouth at bedtime as needed for sleep.   Yes [provider]  emtricitabine-tenofovir AF (DESCOVY) 200-25 MG tablet Take 1 tablet by mouth daily. Take with Prezcobix. 03/27/17  Yes Veryl Speak, FNP  ibuprofen (ADVIL,MOTRIN) 800 MG tablet Take 1 tablet (800 mg  total) by mouth every 8 (eight) hours as needed for headache, mild pain or moderate pain. 09/21/16  Yes Sanjuana KavaNwoko, Agnes I, NP    Family History Family History  Problem Relation Age of Onset  . Cancer Mother        brain  . Cancer Sister        breast    Social History Social History   Tobacco Use  . Smoking status: Current Every Day Smoker    Packs/day: 0.50    Years: 25.00    Pack years: 12.50    Types: Cigarettes  . Smokeless tobacco: Never Used  Substance Use Topics  . Alcohol use: Yes    Alcohol/week: 4.2 - 4.8 oz    Types: 7 - 8 Cans of beer per week     Comment: a case every 3 to 4 days  . Drug use: No     Allergies   Shellfish allergy and Sulfa antibiotics   Review of Systems Review of Systems  Constitutional: Negative for fever.  Eyes: Negative for visual disturbance.  Respiratory: Negative for shortness of breath.   Cardiovascular: Negative for chest pain.  Gastrointestinal: Negative for abdominal pain.  Musculoskeletal: Positive for back pain (left low back) and neck pain (left sided).  Neurological: Positive for weakness and numbness. Negative for headaches.  All other systems reviewed and are negative.    Physical Exam Updated Vital Signs BP 131/88   Pulse 69   Temp 98 F (36.7 C)   Resp 18   Ht 6' (1.829 m)   Wt 78 kg (172 lb)   SpO2 98%   BMI 23.33 kg/m   Physical Exam  Constitutional: He is oriented to person, place, and time. He appears well-developed and well-nourished. No distress.  HENT:  Head: Normocephalic and atraumatic.  Right Ear: External ear normal.  Left Ear: External ear normal.  Nose: Nose normal.  Eyes: Right eye exhibits no discharge. Left eye exhibits no discharge.  Neck: Normal range of motion. Neck supple. No spinous process tenderness and no muscular tenderness present. Normal range of motion present.    Cardiovascular: Normal rate, regular rhythm and normal heart sounds.  Pulses:      Radial pulses are 2+ on the right side, and 2+ on the left side.       Dorsalis pedis pulses are 2+ on the right side, and 2+ on the left side.  Pulmonary/Chest: Effort normal and breath sounds normal.  Abdominal: Soft. He exhibits no distension. There is no tenderness.  Musculoskeletal: He exhibits no edema.       Cervical back: He exhibits no tenderness.       Thoracic back: He exhibits no tenderness.       Lumbar back: He exhibits no tenderness.  Neurological: He is alert and oriented to person, place, and time.  Reflex Scores:      Bicep reflexes are 2+ on the right side and 1+ on the left  side.      Patellar reflexes are 2+ on the right side and 2+ on the left side.      Achilles reflexes are 2+ on the right side and 2+ on the left side. Cn 3-12 grossly intact. 5/5 strength in RUE. 5/5 strength in LUE except for hand which has decreased grip strength. RLE 3/5. Able to get off bed for 1 second then drops. Same for LLE. Decreased sensation throughout LUE, LLE  Skin: Skin is warm and dry. He is not diaphoretic.  Nursing note and vitals reviewed.    ED Treatments / Results  Labs (all labs ordered are listed, but only abnormal results are displayed) Labs Reviewed  ETHANOL - Abnormal; Notable for the following components:      Result Value   Alcohol, Ethyl (B) 140 (*)    All other components within normal limits  COMPREHENSIVE METABOLIC PANEL - Abnormal; Notable for the following components:   Total Protein 8.8 (*)    Total Bilirubin 1.4 (*)    All other components within normal limits  RAPID URINE DRUG SCREEN, HOSP PERFORMED - Abnormal; Notable for the following components:   Barbiturates   (*)    Value: Result not available. Reagent lot number recalled by manufacturer.   All other components within normal limits  URINALYSIS, ROUTINE W REFLEX MICROSCOPIC - Abnormal; Notable for the following components:   Color, Urine STRAW (*)    Specific Gravity, Urine 1.003 (*)    All other components within normal limits  I-STAT CHEM 8, ED - Abnormal; Notable for the following components:   Creatinine, Ser 1.40 (*)    All other components within normal limits  PROTIME-INR  APTT  CBC  DIFFERENTIAL  I-STAT TROPONIN, ED    EKG EKG Interpretation  Date/Time:  Wednesday June 27 2017 13:33:48 EDT Ventricular Rate:  69 PR Interval:    QRS Duration: 96 QT Interval:  437 QTC Calculation: 469 R Axis:   52 Text Interpretation:  Sinus rhythm Borderline short PR interval Left ventricular hypertrophy no significant change since Sept 2018 Confirmed by Pricilla Loveless [id number]) on  06/27/2017 1:42:33 PM   Radiology Ct Head Wo Contrast  Result Date: 06/27/2017 CLINICAL DATA:  57 year old male with left side numbness and weakness 4 days. Hand weakness. HIV positive. EXAM: CT HEAD WITHOUT CONTRAST TECHNIQUE: Contiguous axial images were obtained from the base of the skull through the vertex without intravenous contrast. COMPARISON:  Head CTs 09/25/2016 and earlier. FINDINGS: Brain: Cerebral volume is stable and within normal limits. No midline shift, ventriculomegaly, mass effect, evidence of mass lesion, intracranial hemorrhage or evidence of cortically based acute infarction. Gray-white matter differentiation throughout the brain appears stable and within normal limits. No cortical encephalomalacia identified. Vascular: No suspicious intracranial vascular hyperdensity. Skull: Negative. Sinuses/Orbits: Visualized paranasal sinuses and mastoids are stable and well pneumatized. Other: Visualized orbits and scalp soft tissues are within normal limits. IMPRESSION: Stable and normal noncontrast CT appearance of the brain. No acute intracranial abnormality identified. Electronically Signed   By: Odessa Fleming M.D.   On: 06/27/2017 14:47    Procedures Procedures (including critical care time)  Medications Ordered in ED Medications - No data to display   Initial Impression / Assessment and Plan / ED Course  I have reviewed the triage vital signs and the nursing notes.  Pertinent labs & imaging results that were available during my care of the patient were reviewed by me and considered in my medical decision making (see chart for details).     CT head and initial lab work is unremarkable.  I discussed with tele-neurology, Dr. Suzie Portela, who has evaluated patient via tele-neurology and recommends MRI head and C-spine without contrast. He also advises admission.  Dr. Alvino Chapel of the hospitalist service will admit. Low concern for infectious process.  Final Clinical Impressions(s) / ED Diagnoses    Final diagnoses:  Left sided numbness  Left hand weakness    ED Discharge Orders    None       Pricilla Loveless, MD  06/27/17 1620  

## 2017-06-27 NOTE — ED Notes (Signed)
Patient reported he had already urinated and put poured it in the sink. Informed patient to keep the urine in the urinal provided. Will collect a urine sample when he uses the urinal again.

## 2017-06-27 NOTE — ED Notes (Signed)
Patient ambulated with cane assistance from room 20 to room 17 and back to stretcher. Patient uses his cane on his right side. Patient is slightly unsteady and it appears his left left does not move as easily has his right leg. Patient does have to pick both legs up when getting back on stretcher.

## 2017-06-28 ENCOUNTER — Inpatient Hospital Stay (HOSPITAL_COMMUNITY): Payer: Medicare Other

## 2017-06-28 DIAGNOSIS — B20 Human immunodeficiency virus [HIV] disease: Secondary | ICD-10-CM

## 2017-06-28 DIAGNOSIS — F1014 Alcohol abuse with alcohol-induced mood disorder: Secondary | ICD-10-CM

## 2017-06-28 DIAGNOSIS — M4802 Spinal stenosis, cervical region: Principal | ICD-10-CM

## 2017-06-28 LAB — BASIC METABOLIC PANEL
Anion gap: 8 (ref 5–15)
BUN: 24 mg/dL — ABNORMAL HIGH (ref 6–20)
CHLORIDE: 105 mmol/L (ref 101–111)
CO2: 26 mmol/L (ref 22–32)
Calcium: 9.2 mg/dL (ref 8.9–10.3)
Creatinine, Ser: 1.32 mg/dL — ABNORMAL HIGH (ref 0.61–1.24)
GFR calc non Af Amer: 59 mL/min — ABNORMAL LOW (ref >60)
Glucose, Bld: 120 mg/dL — ABNORMAL HIGH (ref 65–99)
Potassium: 3.8 mmol/L (ref 3.5–5.1)
Sodium: 139 mmol/L (ref 135–145)

## 2017-06-28 LAB — CBC
HEMATOCRIT: 38 % — AB (ref 39.0–52.0)
HEMOGLOBIN: 12.5 g/dL — AB (ref 13.0–17.0)
MCH: 28.3 pg (ref 26.0–34.0)
MCHC: 32.9 g/dL (ref 30.0–36.0)
MCV: 86.2 fL (ref 78.0–100.0)
Platelets: 256 10*3/uL (ref 150–400)
RBC: 4.41 MIL/uL (ref 4.22–5.81)
RDW: 14.3 % (ref 11.5–15.5)
WBC: 5.1 10*3/uL (ref 4.0–10.5)

## 2017-06-28 LAB — RPR: RPR Ser Ql: NONREACTIVE

## 2017-06-28 MED ORDER — LORAZEPAM 1 MG PO TABS: 1.0000 mg | ORAL_TABLET | ORAL | Status: DC | PRN

## 2017-06-28 NOTE — Progress Notes (Signed)
Spoke with nurse Delice Bisonara RN regarding patient's IV status. At this time patient does not have any intravenous fluids ordered or procedures ordered that require vascular access. VAST instructed nurse that if patient receives orders that require IV access to notify VAST team. Tomasita MorrowHeather Maxton Noreen, RN VAST

## 2017-06-28 NOTE — Progress Notes (Signed)
OT Cancellation Note  Patient Details Name: [name] MRN: [id number] DOB: 12/10/1960   Cancelled Treatment:    Reason Eval/Treat Not Completed: Other (comment). [facility]  OT will check on him over there after NS consult/plan.   Arliss Frisina 06/28/2017, 1:14 PM  Marica OtterMaryellen Arlinda Barcelona, OTR/L [phone] 06/28/2017

## 2017-06-28 NOTE — Progress Notes (Signed)
Patient requested to walk in hallway this morning with cane, patient presented steady gait and no abnormalities while ambulating, allowed patient to ambulate with cane. Secretary informed this Clinical research associatewriter that she saw patient getting on elevator, security was contacted to be on the lookout for patient and have him come back to his room. Patients personal belongings remain in the room at this time.

## 2017-06-28 NOTE — Progress Notes (Signed)
This Clinical research associatewriter was present when patient was advised by attending MD to take nothing by mouth per Neurology PA.  Patient verbalized understanding. Patient's tray was removed from bedside table and placed near door for pick-up.  When this writer returned to confirm to patient that [facility] would be [facility],  Patient stated to this writer, " They ain't bout to just start cutting on me, that's why I went on and ate my food."  This writer stated to patient that his plan would now be changed.  Receiving RN in short stay notified and states she will notify anesthesia.  Awaiting call back from receiving floor RN to give report.

## 2017-06-28 NOTE — Consult Note (Signed)
Chief Complaint   Chief Complaint  Patient presents with  . left sided numbness  . Knee Pain    HPI   HPI: [name] is a 57 y.o. male who presented to ER yesterday due to left sided numbness/tingling and weakness. Symptoms started abruptly about 5 days ago. He came to ER yesterday for evaluation and CT head and brain MRI negative for acute abnormality. MRI C spine showed severe spinal stenosis so [facility] was consulted.  He continues to have left sided weakness (mostly in LUE) and numbness/tingling in extremities. Denies weakness on right side with exception of right knee weakness. He was diagnosed with OA in right knee and has been having pain and decreased strength since. He endorses low back pain, but denies neck pain, intrascapular pain, UE pains. He has a history of etoh abuse and HIV. He is not on blood thinners.  Patient Active Problem List   Diagnosis Date Noted  . Left-sided weakness 06/27/2017  . Tricompartment osteoarthritis of right knee 03/27/2017  . Alcohol abuse with alcohol-induced mood disorder ([place]) 09/25/2016  . Polysubstance dependence ([place]) 09/25/2016  . Substance use disorder 09/19/2016  . MDD (major depressive disorder), recurrent severe, without psychosis ([place]) 09/18/2016  . 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 HIV disease ([place]) 11/09/2008    PMH: Past Medical History:  Diagnosis Date  . 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 Hemorrhoids   . HIV (human immunodeficiency virus infection) ([place])     PSH: Past Surgical History:  Procedure Laterality Date  . HERNIA REPAIR  97   lt ing  . RECTAL EXAM UNDER ANESTHESIA N/A 04/24/2014   Procedure:  ligation of bleeding vessels;  Surgeon: Stark Klein, MD;  Location: [facility];  Service: General;  Laterality: N/A;  . TENDON REPAIR     2006-lt index  . WART FULGURATION  07/06/2011   Procedure: FULGURATION ANAL WART;  Surgeon: Harl Bowie, MD;  Location: [facility];  Service: General;  Laterality: N/A;  excision  anal condyloma  . WART FULGURATION N/A 04/23/2014   Procedure: EXCISION OF ANAL CONDYLOMA;  Surgeon: Coralie Keens, MD;  Location: [facility];  Service: General;  Laterality: N/A;    Medications Prior to Admission  Medication Sig Dispense Refill Last Dose  . buPROPion (WELLBUTRIN XL) 300 MG 24 hr tablet Take 1 tablet (300 mg total) by mouth every morning. For depression 10 tablet 0 06/26/2017 at Unknown time  . darunavir-cobicistat (PREZCOBIX) 800-150 MG tablet Take 1 tablet by mouth daily. Swallow whole. Do NOT crush, break or chew tablets. Take with food, take with Descovy. 30 tablet 5 06/26/2017 at Unknown time  . diphenhydrAMINE (BENADRYL) 50 MG tablet Take 100 mg by mouth at bedtime as needed for sleep.   Past Week at Unknown time  . emtricitabine-tenofovir AF (DESCOVY) 200-25 MG tablet Take 1 tablet by mouth daily. Take with Prezcobix. 30 tablet 5 06/26/2017 at Unknown time  . ibuprofen (ADVIL,MOTRIN) 800 MG tablet Take 1 tablet (800 mg total) by mouth every 8 (eight) hours as needed for headache, mild pain or moderate pain. 1 tablet 0 06/26/2017 at Unknown time    SH: Social History   Tobacco Use  . Smoking status: Current Every Day Smoker    Packs/day: 0.50    Years: 25.00    Pack years: 12.50    Types: Cigarettes  . Smokeless tobacco: Never Used  Substance Use Topics  . Alcohol use: Yes    Alcohol/week: 4.2 - 4.8 oz    Types:  7 - 8 Cans of beer per week    Comment: a case every 3 to 4 days  . Drug use: No    MEDS: Prior to Admission medications   Medication Sig Start Date End Date Taking? Authorizing Provider  buPROPion (WELLBUTRIN XL) 300 MG 24 hr tablet Take 1 tablet (300 mg total) by mouth every morning. For depression 09/21/16  Yes N[place], Herbert Pun I, NP  darunavir-cobicistat (PREZCOBIX) 800-150 MG tablet Take 1 tablet by mouth daily. Swallow whole. Do NOT crush, break or chew tablets. Take with food, take with Descovy. 03/27/17  Yes Golden Circle, FNP  diphenhydrAMINE  (BENADRYL) 50 MG tablet Take 100 mg by mouth at bedtime as needed for sleep.   Yes [provider]  emtricitabine-tenofovir AF (DESCOVY) 200-25 MG tablet Take 1 tablet by mouth daily. Take with Prezcobix. 03/27/17  Yes Golden Circle, FNP  ibuprofen (ADVIL,MOTRIN) 800 MG tablet Take 1 tablet (800 mg total) by mouth every 8 (eight) hours as needed for headache, mild pain or moderate pain. 09/21/16  Yes Encarnacion Slates, NP    ALLERGY: Allergies  Allergen Reactions  . Shellfish Allergy Anaphylaxis and Swelling    Swelling everywhere  . Sulfa Antibiotics Anaphylaxis, Hives, Itching and Swelling    Swelling all over     Social History   Tobacco Use  . Smoking status: Current Every Day Smoker    Packs/day: 0.50    Years: 25.00    Pack years: 12.50    Types: Cigarettes  . Smokeless tobacco: Never Used  Substance Use Topics  . Alcohol use: Yes    Alcohol/week: 4.2 - 4.8 oz    Types: 7 - 8 Cans of beer per week    Comment: a case every 3 to 4 days     Family History  Problem Relation Age of Onset  . Cancer Mother        brain  . Cancer Sister        breast     ROS   Review of Systems  Constitutional: Negative.   HENT: Negative.   Eyes: Negative.   Respiratory: Negative.   Cardiovascular: Negative.   Gastrointestinal: Negative.   Genitourinary: Negative.   Musculoskeletal: Positive for back pain and joint pain (right knee (chronic)). Negative for falls, myalgias and neck pain.       Positive gait instability  Skin: Negative.   Neurological: Positive for tingling (left sided), sensory change (left sided) and weakness. Negative for dizziness, tremors, speech change, focal weakness, seizures, loss of consciousness and headaches.    Exam   Vitals:   06/28/17 0547 06/28/17 1338  BP: 125/90 (!) 145/90  Pulse: 71 70  Resp: 16 14  Temp: 98.2 F (36.8 C)   SpO2: 100% 99%   General appearance: WDWN, NAD, sitting on edge of bed, cane at bedside Eyes: PERRL,  Fundoscopic: normal Cardiovascular: Regular rate and rhythm without murmurs, rubs, gallops. No edema or variciosities. Distal pulses normal. Pulmonary: Clear to auscultation Musculoskeletal:     Gait: did not examine    Muscle tone upper extremities: Normal    Muscle tone lower extremities: Normal    Motor exam: Upper Extremities Deltoid Bicep Tricep Grip  Right 5/5 5/5 5/5 5/5  Left 4/5 4/5 4/5 4/5   Lower Extremity IP Quad PF DF EHL  Right 5/5 5/5 5/5 5/5 5/5  Left 5/5 5/5 5/5 5/5 5/5   Neurological Awake, alert, oriented Memory and concentration grossly intact Speech fluent, appropriate  CNII: Visual fields normal CNIII/IV/VI: EOMI CNV: Facial sensation normal CNVII: Symmetric, normal strength CNVIII: Grossly normal CNIX: Normal palate movement CNXI: Trap and SCM strength normal CN XII: Tongue protrusion normal Sensation grossly intact to LT DTR: increased Coordination (finger/nose & heel/shin): Normal  Results - Imaging/Labs   Results for orders placed or performed during the hospital encounter of 06/27/17 (from the past 48 hour(s))  Ethanol     Status: Abnormal   Collection Time: 06/27/17  1:30 PM  Result Value Ref Range   Alcohol, Ethyl (B) 140 (H) <10 mg/dL    Comment: (NOTE) Lowest detectable limit for serum alcohol is 10 mg/dL. For medical purposes only. Performed at [facility], [place] [address], [place], [place] [address]   Protime-INR     Status: None   Collection Time: 06/27/17  1:30 PM  Result Value Ref Range   Prothrombin Time 12.4 11.4 - 15.2 seconds   INR 0.93     Comment: Performed at [facility], [place] [address], [place], [place] [address]  APTT     Status: None   Collection Time: 06/27/17  1:30 PM  Result Value Ref Range   aPTT 29 24 - 36 seconds    Comment: Performed at [facility], [place] [address], [place], [place] [address]  CBC     Status: None   Collection Time: 06/27/17  1:30 PM   Result Value Ref Range   WBC 5.6 4.0 - 10.5 K/uL   RBC 5.02 4.22 - 5.81 MIL/uL   Hemoglobin 14.5 13.0 - 17.0 g/dL   HCT 43.5 39.0 - 52.0 %   MCV 86.7 78.0 - 100.0 fL   MCH 28.9 26.0 - 34.0 pg   MCHC 33.3 30.0 - 36.0 g/dL   RDW 14.1 11.5 - 15.5 %   Platelets 295 150 - 400 K/uL    Comment: Performed at [facility], [place] [address], [place], [place] [address]  Differential     Status: None   Collection Time: 06/27/17  1:30 PM  Result Value Ref Range   Neutrophils Relative % 52 %   Neutro Abs 2.9 1.7 - 7.7 K/uL   Lymphocytes Relative 39 %   Lymphs Abs 2.2 0.7 - 4.0 K/uL   Monocytes Relative 8 %   Monocytes Absolute 0.5 0.1 - 1.0 K/uL   Eosinophils Relative 1 %   Eosinophils Absolute 0.1 0.0 - 0.7 K/uL   Basophils Relative 0 %   Basophils Absolute 0.0 0.0 - 0.1 K/uL    Comment: Performed at [facility], [place] [address], [place], [place] [address]  Comprehensive metabolic panel     Status: Abnormal   Collection Time: 06/27/17  1:30 PM  Result Value Ref Range   Sodium 141 135 - 145 mmol/L   Potassium 4.5 3.5 - 5.1 mmol/L   Chloride 104 101 - 111 mmol/L   CO2 29 22 - 32 mmol/L   Glucose, Bld 72 65 - 99 mg/dL   BUN 17 6 - 20 mg/dL   Creatinine, Ser 1.19 0.61 - 1.24 mg/dL   Calcium 9.8 8.9 - 10.3 mg/dL   Total Protein 8.8 (H) 6.5 - 8.1 g/dL   Albumin 5.0 3.5 - 5.0 g/dL   AST 32 15 - 41 U/L   ALT 27 17 - 63 U/L   Alkaline Phosphatase 42 38 - 126 U/L   Total Bilirubin 1.4 (H) 0.3 - 1.2 mg/dL   GFR calc non Af  Amer >60 >60 mL/min   GFR calc Af Amer >60 >60 mL/min    Comment: (NOTE) The eGFR has been calculated using the CKD EPI equation. This calculation has not been validated in all clinical situations. eGFR's persistently <60 mL/min signify possible Chronic Kidney Disease.    Anion gap 8 5 - 15    Comment: Performed at [facility], [place] [address] [place], [place] [address]  I-stat troponin, ED     Status: None    Collection Time: 06/27/17  1:34 PM  Result Value Ref Range   Troponin i, poc 0.00 0.00 - 0.08 ng/mL   Comment 3            Comment: Due to the release kinetics of cTnI, a negative result within the first hours of the onset of symptoms does not rule out myocardial infarction with certainty. If myocardial infarction is still suspected, repeat the test at appropriate intervals.   I-Stat Chem 8, ED     Status: Abnormal   Collection Time: 06/27/17  1:36 PM  Result Value Ref Range   Sodium 139 135 - 145 mmol/L   Potassium 4.5 3.5 - 5.1 mmol/L   Chloride 102 101 - 111 mmol/L   BUN 15 6 - 20 mg/dL   Creatinine, Ser 1.40 (H) 0.61 - 1.24 mg/dL   Glucose, Bld 68 65 - 99 mg/dL   Calcium, Ion 1.22 1.15 - 1.40 mmol/L   TCO2 26 22 - 32 mmol/L   Hemoglobin 16.3 13.0 - 17.0 g/dL   HCT 48.0 39.0 - 52.0 %  Urine rapid drug screen (hosp performed)     Status: Abnormal   Collection Time: 06/27/17  2:29 PM  Result Value Ref Range   Opiates NONE DETECTED NONE DETECTED   Cocaine NONE DETECTED NONE DETECTED   Benzodiazepines NONE DETECTED NONE DETECTED   Amphetamines NONE DETECTED NONE DETECTED   Tetrahydrocannabinol NONE DETECTED NONE DETECTED   Barbiturates (A) NONE DETECTED    Result not available. Reagent lot number recalled by manufacturer.    Comment: Performed at [facility], [place] [address] [place], [place] [address]  Urinalysis, Routine w reflex microscopic     Status: Abnormal   Collection Time: 06/27/17  2:29 PM  Result Value Ref Range   Color, Urine STRAW (A) YELLOW   APPearance CLEAR CLEAR   Specific Gravity, Urine 1.003 (L) 1.005 - 1.030   pH 5.0 5.0 - 8.0   Glucose, UA NEGATIVE NEGATIVE mg/dL   Hgb urine dipstick NEGATIVE NEGATIVE   Bilirubin Urine NEGATIVE NEGATIVE   Ketones, ur NEGATIVE NEGATIVE mg/dL   Protein, ur NEGATIVE NEGATIVE mg/dL   Nitrite NEGATIVE NEGATIVE   Leukocytes, UA NEGATIVE NEGATIVE    Comment: Performed at [place] [address], [place], [place] [address]  Hemoglobin A1c     Status: Abnormal   Collection Time: 06/27/17  5:35 PM  Result Value Ref Range   Hgb A1c MFr Bld 6.0 (H) 4.8 - 5.6 %    Comment: (NOTE) Pre diabetes:          5.7%-6.4% Diabetes:              >6.4% Glycemic control for   <7.0% adults with diabetes    Mean Plasma Glucose 125.5 mg/dL    Comment: Performed at [place] [address], [place], [place] [address]  TSH     Status: None   Collection Time: 06/27/17  5:35 PM  Result Value Ref Range   TSH 1.470 0.350 - 4.500 uIU/mL    Comment: Performed by a 3rd Generation assay with a functional sensitivity of <=0.01 uIU/mL. Performed at [facility], [place] [address], [place], [place] [address]   RPR     Status: None   Collection Time: 06/27/17  6:40 PM  Result Value Ref Range   RPR Ser Ql Non Reactive Non Reactive    Comment: (NOTE) Performed At: [facility] [place], [place] [id number] Rush Farmer MD [id number] Performed at [facility], [place] [address], [place], [place] [address]   Vitamin B12     Status: None   Collection Time: 06/27/17  6:45 PM  Result Value Ref Range   Vitamin B-12 283 180 - 914 pg/mL    Comment: (NOTE) This assay is not validated for testing neonatal or myeloproliferative syndrome specimens for Vitamin B12 levels. Performed at [facility], [place] [address] [place], [place] [address]   CBC     Status: Abnormal   Collection Time: 06/28/17  4:26 AM  Result Value Ref Range   WBC 5.1 4.0 - 10.5 K/uL   RBC 4.41 4.22 - 5.81 MIL/uL   Hemoglobin 12.5 (L) 13.0 - 17.0 g/dL    Comment: REPEATED TO VERIFY DELTA CHECK NOTED    HCT 38.0 (L) 39.0 - 52.0 %   MCV 86.2 78.0 - 100.0 fL   MCH 28.3 26.0 - 34.0 pg   MCHC 32.9 30.0 - 36.0 g/dL   RDW 14.3 11.5 - 15.5 %   Platelets 256 150 - 400 K/uL    Comment: Performed at [facility], [place]  [address], [place], [place] [address]  Basic metabolic panel     Status: Abnormal   Collection Time: 06/28/17  4:26 AM  Result Value Ref Range   Sodium 139 135 - 145 mmol/L   Potassium 3.8 3.5 - 5.1 mmol/L   Chloride 105 101 - 111 mmol/L   CO2 26 22 - 32 mmol/L   Glucose, Bld 120 (H) 65 - 99 mg/dL   BUN 24 (H) 6 - 20 mg/dL   Creatinine, Ser 1.32 (H) 0.61 - 1.24 mg/dL   Calcium 9.2 8.9 - 10.3 mg/dL   GFR calc non Af Amer 59 (L) >60 mL/min   GFR calc Af Amer >60 >60 mL/min    Comment: (NOTE) The eGFR has been calculated using the CKD EPI equation. This calculation has not been validated in all clinical situations. eGFR's persistently <60 mL/min signify possible Chronic Kidney Disease.    Anion gap 8 5 - 15    Comment: Performed at [facility], [place] [address], [place], [place] [address]    Ct Head Wo Contrast  Result Date: 06/27/2017 CLINICAL DATA:  57 year old male with left side numbness and weakness 4 days. Hand weakness. HIV positive. EXAM: CT HEAD WITHOUT CONTRAST TECHNIQUE: Contiguous axial images were obtained from the base of the skull through the vertex without intravenous contrast. COMPARISON:  Head CTs 09/25/2016 and earlier. FINDINGS: Brain: Cerebral volume is stable and within normal limits. No midline shift, ventriculomegaly, mass effect, evidence of mass lesion, intracranial hemorrhage or evidence of cortically based acute infarction. Gray-white matter differentiation throughout the brain appears stable and within normal limits. No cortical encephalomalacia identified. Vascular: No suspicious intracranial vascular hyperdensity. Skull: Negative. Sinuses/Orbits: Visualized paranasal sinuses and mastoids are stable and well pneumatized. Other: Visualized orbits and scalp soft tissues are within  normal limits. IMPRESSION: Stable and normal noncontrast CT appearance of the brain. No acute intracranial abnormality identified. Electronically Signed   By: Genevie Ann M.D.    On: 06/27/2017 14:47   Mr Brain Wo Contrast (neuro Protocol)  Result Date: 06/27/2017 CLINICAL DATA:  Initial evaluation for 5 day history of left-sided numbness. EXAM: MRI HEAD WITHOUT CONTRAST MRI CERVICAL SPINE WITHOUT CONTRAST TECHNIQUE: Multiplanar, multiecho pulse sequences of the brain and surrounding structures, and cervical spine, to include the craniocervical junction and cervicothoracic junction, were obtained without intravenous contrast. COMPARISON:  Prior CT from earlier the same day. FINDINGS: MRI HEAD FINDINGS Brain: Generalized age-related cerebral atrophy. Few scattered mild noted within the periventricular and deep white matter both cerebral hemispheres, nonspecific, but felt to be within normal limits for age. No evidence for acute or subacute ischemia. Gray-white matter differentiation maintained. No evidence for cortical infarction. No evidence for acute or chronic intracranial hemorrhage. No mass lesion, midline shift or mass effect. No hydrocephalus. No extra-axial fluid collection. Pituitary gland within normal limits. Vascular: Major intracranial vascular flow voids are maintained. Hypoplastic left vertebral artery not visualized. Skull and upper cervical spine: Craniocervical junction within normal limits. Bone marrow signal intensity normal. No scalp soft tissue abnormality. Sinuses/Orbits: Globes and orbital soft tissues within normal limits. Paranasal sinuses are largely clear. No mastoid effusion. Other: None. MRI CERVICAL SPINE FINDINGS Alignment: Straightening with reversal of the normal cervical lordosis, apex at C3-4. Trace chronic facet mediated retrolisthesis of C3 on C4, C5 on C6, C6 on C7. Vertebrae: Vertebral body heights maintained without evidence for acute or chronic fracture. Bone marrow signal intensity within normal limits. No discrete or worrisome osseous lesions. Heterogeneous signal intensity within the C3 through C6 vertebral bodies consistent with degenerative  changes. Cord: Abnormal T2 signal intensity within the bilateral cervical spinal cord at the level of C3-4, consistent with myelomalacia (series 3, image 8). Changes slightly more prominent on the left. Signal intensity within the cervical spinal cord otherwise normal. Posterior Fossa, vertebral arteries, paraspinal tissues: Craniocervical junction within normal limits. Paraspinous and prevertebral soft tissues normal. Normal intravascular flow void within the right vertebral artery. Hypoplastic left vertebral artery not well visualized. Disc levels: C2-C3: Negative interspace. Prominent right-sided facet degeneration. Resultant mild right C3 foraminal stenosis. No significant canal narrowing. C3-C4: Chronic diffuse degenerative disc osteophyte with intervertebral disc space narrowing. Prominent posterior component effaces the ventral CSF with severe flattening of the cervical spinal cord. Severe stenosis with the thecal sac measuring 3-4 mm at its most narrow point. Associated myelomalacia noted. Severe bilateral C4 foraminal stenosis. C4-C5: Chronic diffuse degenerative disc osteophyte with bilateral uncovertebral spurring. Flattening of the ventral CSF with moderate spinal stenosis. Associated cord flattening without cord signal changes, greater on the right. Severe right with moderate left C5 foraminal stenosis. C5-C6: Diffuse disc bulge with right worse than left uncovertebral spurring. Resultant mild spinal stenosis without significant cord deformity. Severe bilateral C6 foraminal narrowing, right worse than left. C6-C7: Diffuse disc bulge with bilateral uncovertebral spurring. Flattening of the ventral CSF. Mild facet hypertrophy. Resultant mild to moderate spinal stenosis without cord deformity. Severe bilateral C7 foraminal stenosis. C7-T1: Mild facet hypertrophy. No significant canal stenosis. Mild bilateral C8 foraminal narrowing. Upper thoracic spine demonstrates no significant finding. IMPRESSION: MRI  HEAD IMPRESSION Normal brain MRI for age.  No acute intracranial abnormality. MRI CERVICAL SPINE IMPRESSION 1. Severe degenerative disc osteophyte at C3-4 with resultant severe spinal stenosis and impingement of the cervical spinal cord. Associated cord signal abnormality at  this level consistent with myelomalacia. Finding could result in patient's symptoms. 2. 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 Severe bilateral C4 foraminal stenosis, severe right with moderate left C5 foraminal narrowing, with severe bilateral C6 and C7 foraminal stenosis Electronically Signed   By: Jeannine Boga M.D.   On: 06/27/2017 21:44   Mr Cervical Spine Wo Contrast  Result Date: 06/27/2017 CLINICAL DATA:  Initial evaluation for 5 day history of left-sided numbness. EXAM: MRI HEAD WITHOUT CONTRAST MRI CERVICAL SPINE WITHOUT CONTRAST TECHNIQUE: Multiplanar, multiecho pulse sequences of the brain and surrounding structures, and cervical spine, to include the craniocervical junction and cervicothoracic junction, were obtained without intravenous contrast. COMPARISON:  Prior CT from earlier the same day. FINDINGS: MRI HEAD FINDINGS Brain: Generalized age-related cerebral atrophy. Few scattered mild noted within the periventricular and deep white matter both cerebral hemispheres, nonspecific, but felt to be within normal limits for age. No evidence for acute or subacute ischemia. Gray-white matter differentiation maintained. No evidence for cortical infarction. No evidence for acute or chronic intracranial hemorrhage. No mass lesion, midline shift or mass effect. No hydrocephalus. No extra-axial fluid collection. Pituitary gland within normal limits. Vascular: Major intracranial vascular flow voids are maintained. Hypoplastic left vertebral artery not visualized. Skull and upper cervical spine: Craniocervical junction  within normal limits. Bone marrow signal intensity normal. No scalp soft tissue abnormality. Sinuses/Orbits: Globes and orbital soft tissues within normal limits. Paranasal sinuses are largely clear. No mastoid effusion. Other: None. MRI CERVICAL SPINE FINDINGS Alignment: Straightening with reversal of the normal cervical lordosis, apex at C3-4. Trace chronic facet mediated retrolisthesis of C3 on C4, C5 on C6, C6 on C7. Vertebrae: Vertebral body heights maintained without evidence for acute or chronic fracture. Bone marrow signal intensity within normal limits. No discrete or worrisome osseous lesions. Heterogeneous signal intensity within the C3 through C6 vertebral bodies consistent with degenerative changes. Cord: Abnormal T2 signal intensity within the bilateral cervical spinal cord at the level of C3-4, consistent with myelomalacia (series 3, image 8). Changes slightly more prominent on the left. Signal intensity within the cervical spinal cord otherwise normal. Posterior Fossa, vertebral arteries, paraspinal tissues: Craniocervical junction within normal limits. Paraspinous and prevertebral soft tissues normal. Normal intravascular flow void within the right vertebral artery. Hypoplastic left vertebral artery not well visualized. Disc levels: C2-C3: Negative interspace. Prominent right-sided facet degeneration. Resultant mild right C3 foraminal stenosis. No significant canal narrowing. C3-C4: Chronic diffuse degenerative disc osteophyte with intervertebral disc space narrowing. Prominent posterior component effaces the ventral CSF with severe flattening of the cervical spinal cord. Severe stenosis with the thecal sac measuring 3-4 mm at its most narrow point. Associated myelomalacia noted. Severe bilateral C4 foraminal stenosis. C4-C5: Chronic diffuse degenerative disc osteophyte with bilateral uncovertebral spurring. Flattening of the ventral CSF with moderate spinal stenosis. Associated cord flattening  without cord signal changes, greater on the right. Severe right with moderate left C5 foraminal stenosis. C5-C6: Diffuse disc bulge with right worse than left uncovertebral spurring. Resultant mild spinal stenosis without significant cord deformity. Severe bilateral C6 foraminal narrowing, right worse than left. C6-C7: Diffuse disc bulge with bilateral uncovertebral spurring. Flattening of the ventral CSF. Mild facet hypertrophy. Resultant mild to moderate spinal stenosis without cord deformity. Severe bilateral C7 foraminal stenosis. C7-T1: Mild facet hypertrophy. No significant canal stenosis. Mild bilateral C8 foraminal narrowing. Upper thoracic spine demonstrates no significant finding. IMPRESSION: MRI HEAD IMPRESSION Normal brain MRI  for age.  No acute intracranial abnormality. MRI CERVICAL SPINE IMPRESSION 1. Severe degenerative disc osteophyte at C3-4 with resultant severe spinal stenosis and impingement of the cervical spinal cord. Associated cord signal abnormality at this level consistent with myelomalacia. Finding could result in patient's symptoms. 2. 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 Severe bilateral C4 foraminal stenosis, severe right with moderate left C5 foraminal narrowing, with severe bilateral C6 and C7 foraminal stenosis Electronically Signed   By: Jeannine Boga M.D.   On: 06/27/2017 21:44    Impression/Plan   57 y.o. male with cervical spinal stenosis with myelopathy secondary to multilevel cervical degenerative changes most prominent at C3-4 and C4-5. At C3-4 there is a severe spinal stenosis due to severe disc osteophyte complex. This causes cord impingement. There is also cord signal abnormality. At C4-5 there is moderate spinal stenosis with cord flattening. No cord changes at this level. He has LUE weakness on exam but otherwise, grossly normal strength.  I have  reviewed the C spine MRI with Mr Fentress in detail and discussed with him the surgical recommendation for C3-4 and C4-5 ACDF. I discussed at length with him the details of the procedure including risks and benefits, post procedure expectations, recovery, prognosis, etc. We also talked about alternatives and not operating as well as the risks and benefits associated with that. He would like to take some time to think about the procedure. I have answered all of his questions. Attending, Dr Kathyrn Sheriff who will perform the surgery, will follow up with him today or tomorrow. I have communicated this with patient who states understanding.

## 2017-06-28 NOTE — Evaluation (Signed)
Physical Therapy Evaluation Patient Details Name: [name] MRN: [id number] DOB: 1960/08/03 Today's Date: 06/28/2017   History of Present Illness  57 yo male admitted with L sided weakness, falls. MRI (+) c3-c4 severe spinal stenosis with impingment of spinal cord. Hx of HIV, OA, ETOH abuse, polysubstance abuse.     Clinical Impression  On eval, pt was Min guard assist for mobility. He walked ~500 feet with use of his straight cane. Pt continues to report L side sensation and strength deficits. He also has some balance deficits. He does not feel he is anywhere near baseline with respect to his mobility. At this time, will recommend OP PT f/u if his symptoms persist.     Follow Up Recommendations Outpatient PT    Equipment Recommendations  None recommended by PT    Recommendations for Other Services could possibly benefit from f/u OP neurology if discharged and symptoms persist    Precautions / Restrictions Precautions Precautions: Fall Restrictions Weight Bearing Restrictions: No      Mobility  Bed Mobility Overal bed mobility: Modified Independent                Transfers Overall transfer level: Modified independent                  Ambulation/Gait Ambulation/Gait assistance: Min guard Gait Distance (Feet): 500 Feet Assistive device: Straight cane Gait Pattern/deviations: Decreased dorsiflexion - right;Staggering left;Staggering right;Drifts right/left Gait velocity: Pt requesting quad cane but I don't think that's the appropriate AD. He has a fair gait speed at present and the quad cane will only slow him down even more.    General Gait Details: close guard for safety. Intermittent unsteadiness noted. Pt tolerated distance well. Some LE weakness noted.   Stairs            Wheelchair Mobility    Modified Rankin (Stroke Patients Only)       Balance Overall balance assessment: Needs assistance         Standing balance support: Single  extremity supported Standing balance-Leahy Scale: Poor Standing balance comment: Pt requires at least 1 point of support to safely ambulate                             Pertinent Vitals/Pain Pain Assessment: Faces Pain Location: back, shoulders Pain Intervention(s): Monitored during session;Repositioned    Home Living Family/patient expects to be discharged to:: Private residence     Type of Home: Apartment Home Access: Stairs to enter Entrance Stairs-Rails: Right Entrance Stairs-Number of Steps: 4 Home Layout: One level Home Equipment: None Additional Comments: borrowed cane    Prior Function Level of Independence: Independent               Hand Dominance        Extremity/Trunk Assessment   Upper Extremity Assessment Upper Extremity Assessment: Defer to OT evaluation    Lower Extremity Assessment Lower Extremity Assessment: RLE deficits/detail;LLE deficits/detail RLE Deficits / Details: at lesast: hip flex 3+/5, knee ext 3+/5, DF/PF 3+/5 (unsure if pt was putting forth full effort) LLE Deficits / Details: at least: hip flex 3+/5, knee ext 3+/5, DF/PF 3+/5 (unsure if pt was putting forth full effort) LLE Sensation: decreased light touch       Communication   Communication: No difficulties  Cognition Arousal/Alertness: Awake/alert Behavior During Therapy: WFL for tasks assessed/performed Overall Cognitive Status: Within Functional Limits for tasks assessed  General Comments      Exercises     Assessment/Plan    PT Assessment Patient needs continued PT services  PT Problem List Decreased balance;Decreased mobility;Decreased strength;Decreased knowledge of use of DME       PT Treatment Interventions DME instruction;Gait training;Functional mobility training;Therapeutic activities;Balance training;Patient/family education;Therapeutic exercise;Stair training    PT Goals (Current goals  can be found in the Care Plan section)  Acute Rehab PT Goals Patient Stated Goal: to regain independence and PLOF PT Goal Formulation: With patient Time For Goal Achievement: 07/12/17 Potential to Achieve Goals: Good    Frequency Min 3X/week   Barriers to discharge        Co-evaluation               AM-PAC PT "6 Clicks" Daily Activity  Outcome Measure Difficulty turning over in bed (including adjusting bedclothes, sheets and blankets)?: None Difficulty moving from lying on back to sitting on the side of the bed? : None Difficulty sitting down on and standing up from a chair with arms (e.g., wheelchair, bedside commode, etc,.)?: None Help needed moving to and from a bed to chair (including a wheelchair)?: A Little Help needed walking in hospital room?: A Little Help needed climbing 3-5 steps with a railing? : A Little 6 Click Score: 21    End of Session Equipment Utilized During Treatment: Gait belt Activity Tolerance: Patient tolerated treatment well Patient left: in bed;with call bell/phone within reach   PT Visit Diagnosis: Other abnormalities of gait and mobility (R26.89);Unsteadiness on feet (R26.81);Muscle weakness (generalized) (M62.81);History of falling (Z91.81)    Time: [id number] PT Time Calculation (min) (ACUTE ONLY): 18 min   Charges:   PT Evaluation $PT Eval Moderate Complexity: 1 Mod     PT G Codes:          Rebeca AlertJannie Emmalena Canny, MPT Pager: [phone]

## 2017-06-28 NOTE — Progress Notes (Signed)
PROGRESS NOTE    [name]  ZOX:[id number] DOB: June 27, 1960 DOA: 06/27/2017 PCP: [name], MD    Brief Narrative:  57 year old male presented with left-sided weakness and numbness.  Patient does have the significant past medical history of for HIV, right knee osteoarthritis, and history of alcohol abuse.  Presents with 5-day history of left-sided numbness, persistent and worsening, associated with weakness and falls.  Reported paresthesias on his left neck that radiates into his left arm.  On his initial physical examination blood pressure 134/96, heart rate 82, respiratory rate 25, oxygenation 98%.  Moist mucous membranes, lungs clear to auscultation bilaterally, heart S1-S2 present rhythmic, abdomen soft nontender, no lower extremity edema.  Positive left-sided weakness, 4 out of 5.  Sodium 141, potassium 4.5, chloride 104, bicarb 29, glucose 92, BUN 17, creatinine 1.19, wbc 5,6, hemoglobin 14.5 hematocrit 43.5, platelets 295, urine analysis specific gravity 1.003, negative for infection.  Urine drug screen negative.  CT negative for acute changes.  EKG with sinus rhythm, normal axis, normal intervals, positive LVH.   Patient was admitted to the hospital with a working diagnosis of left-sided focal neurologic deficit, to rule out CVA/cervical spine disease.    Assessment & Plan:   Principal Problem:   Left-sided weakness Active Problems:   HIV disease (HCC)   Alcohol abuse with alcohol-induced mood disorder (HCC)  1.  Severe spinal stenosis, C3-C4 with impingement of the cervical spinal cord.  Patient with worsening symptoms, on the left upper and lower extremity. Continue pain control with acetaminophen and physical therapy evaluation. Case discussed with neurology, with recommendations for neurosurgery consultation.   Case discussed with Neurosurgery, with recommendations to transfer to [facility] for further evaluation and likely surgical intervention.   2. AKI. Suspected  pre-renal, renal function with serum cr down to 1,32 from 1,40, patient tolerating po well, will follow renal panel in am. Avoid hypotension or nephrotoxic medications.   2. Right knee pain. Persistent pain, prior radiographic evaluation, with osteoarthritis, will repeat knee films to compare, may need knee MRI.   3. HIV. Continue antiretroviral therapy, seems to be stable, no signs of opportunistic infections, patient not on prophylactic antibiotics, follow with ID clinic.   4. Alcohol abuse. No signs of withdrawal, will continue neuro checks per unit protocol. Will continue with as needed lorazepam for anxiety, will hold on CIWA protocol for now.    5. Depression. Continue with Wellbutrin.   DVT prophylaxis: enoxaparin   Code Status: full Family Communication: no family at the bedside  Disposition Plan:  Pending neurosurgery evaluation at [facility].    Consultants:   Neurosurgery  Procedures:     Antimicrobials:       Subjective: Patient continue to have weakness and paresthesias on the left upper extremity and left lower extremity, no headache, no nausea or vomiting, no neck pain.   Objective: Vitals:   06/27/17 1400 06/27/17 1728 06/27/17 2124 06/28/17 0547  BP: 131/88 (!) 144/100 (!) 141/90 125/90  Pulse: 69 73 93 71  Resp: 18  20 16   Temp:  98.4 F (36.9 C) 98.1 F (36.7 C) 98.2 F (36.8 C)  TempSrc:  Oral Oral Oral  SpO2: 98% 98% 100% 100%  Weight:      Height:       No intake or output data in the 24 hours ending 06/28/17 1159 Filed Weights   06/27/17 1144  Weight: 78 kg (172 lb)    Examination:   General: Not in pain or dyspnea  Neurology:  Awake and alert, non focal. Decreased hand grip on the left hand with decreased strength on the left leg, 4/5 power.   E ENT: no pallor, no icterus, oral mucosa moist Cardiovascular: No JVD. S1-S2 present, rhythmic, no gallops, rubs, or murmurs. No lower extremity edema. Pulmonary: vesicular breath sounds  bilaterally, adequate air movement, no wheezing, rhonchi or rales. Gastrointestinal. Abdomen flat, no organomegaly, non tender, no rebound or guarding Skin. No rashes Musculoskeletal: no joint deformities     Data Reviewed: I have personally reviewed following labs and imaging studies  CBC: Recent Labs  Lab 06/27/17 1330 06/27/17 1336 06/28/17 0426  WBC 5.6  --  5.1  NEUTROABS 2.9  --   --   HGB 14.5 16.3 12.5*  HCT 43.5 48.0 38.0*  MCV 86.7  --  86.2  PLT 295  --  256   Basic Metabolic Panel: Recent Labs  Lab 06/27/17 1330 06/27/17 1336 06/28/17 0426  NA 141 139 139  K 4.5 4.5 3.8  CL 104 102 105  CO2 29  --  26  GLUCOSE 72 68 120*  BUN 17 15 24*  CREATININE 1.19 1.40* 1.32*  CALCIUM 9.8  --  9.2   GFR: Estimated Creatinine Clearance: 68.6 mL/min (A) (by C-G formula based on SCr of 1.32 mg/dL (H)). Liver Function Tests: Recent Labs  Lab 06/27/17 1330  AST 32  ALT 27  ALKPHOS 42  BILITOT 1.4*  PROT 8.8*  ALBUMIN 5.0   No results for input(s): LIPASE, AMYLASE in the last 168 hours. No results for input(s): AMMONIA in the last 168 hours. Coagulation Profile: Recent Labs  Lab 06/27/17 1330  INR 0.93   Cardiac Enzymes: No results for input(s): CKTOTAL, CKMB, CKMBINDEX, TROPONINI in the last 168 hours. BNP (last 3 results) No results for input(s): PROBNP in the last 8760 hours. HbA1C: Recent Labs    06/27/17 1735  HGBA1C 6.0*   CBG: No results for input(s): GLUCAP in the last 168 hours. Lipid Profile: No results for input(s): CHOL, HDL, LDLCALC, TRIG, CHOLHDL, LDLDIRECT in the last 72 hours. Thyroid Function Tests: Recent Labs    06/27/17 1735  TSH 1.470   Anemia Panel: Recent Labs    06/27/17 1845  VITAMINB12 283      Radiology Studies: I have reviewed all of the imaging during this hospital visit personally     Scheduled Meds: . buPROPion  300 mg Oral Daily  . darunavir-cobicistat  1 tablet Oral Q breakfast  .  emtricitabine-tenofovir AF  1 tablet Oral Q breakfast  . enoxaparin (LOVENOX) injection  40 mg Subcutaneous Q24H  . folic acid  1 mg Oral Daily  . LORazepam  1 mg Intravenous Once  . multivitamin with minerals  1 tablet Oral Daily  . thiamine  100 mg Oral Daily   Or  . thiamine  100 mg Intravenous Daily   Continuous Infusions:   LOS: 1 day        Keali Mccraw Annett Gulaaniel Pavneet Markwood, MD [facility] Pager [phone]

## 2017-06-28 NOTE — Progress Notes (Addendum)
Received call from Dr Ella JubileeArrien regarding patient.  57 year old, hx HIV and etoh abuse, who presented to ER yesterday c/o left sided weakness and reported obvious weakness LUE and LLE on exam. MRI brain and C Spine ordered and significant for multilevel degenerative changes most significant at C3-4 where there is severe spinal stenosis and impingement of spinal cord. There is evidence of cord signal abnormality as well as moderate cerivcal stenosis at C4-5.  [facility]. Confirmed with nursing that patient has not yet eaten lunch. Will make NPO. Will undergo C3-4 and C4-5 ACDF later today due to severe spinal stenosis.   Full consult to follow.  Addendum Patient decided to eat. See progress note from nursing. Surgery cancelled for today. See consult note.

## 2017-06-28 NOTE — Progress Notes (Signed)
Patient returned to room at this time, informed patient that he can walk, but he needs to stay on the unit. Patient verbalizes understanding, and states "I was just walking" no needs voiced at this time.

## 2017-06-28 NOTE — Progress Notes (Signed)
Patient arrive to the department via [facility]. Patient is alert and oriented x4. Patient is refusing to change into a hospital gown or allow an assessment to be completed at this time as he is "frustrated about not knowing what is going on with him." Will continue to monitor.

## 2017-06-29 DIAGNOSIS — R531 Weakness: Secondary | ICD-10-CM

## 2017-06-29 LAB — BASIC METABOLIC PANEL
Anion gap: 4 — ABNORMAL LOW (ref 5–15)
BUN: 14 mg/dL (ref 6–20)
CHLORIDE: 107 mmol/L (ref 101–111)
CO2: 28 mmol/L (ref 22–32)
Calcium: 9.1 mg/dL (ref 8.9–10.3)
Creatinine, Ser: 1.25 mg/dL — ABNORMAL HIGH (ref 0.61–1.24)
GFR calc Af Amer: >60 mL/min (ref >60)
GFR calc non Af Amer: >60 mL/min (ref >60)
GLUCOSE: 97 mg/dL (ref 65–99)
POTASSIUM: 4.1 mmol/L (ref 3.5–5.1)
Sodium: 139 mmol/L (ref 135–145)

## 2017-06-29 MED ORDER — ACETAMINOPHEN 325 MG PO TABS: 650.0000 mg | ORAL_TABLET | Freq: Four times a day (QID) | ORAL | Status: DC | PRN

## 2017-06-29 MED ORDER — THIAMINE HCL 100 MG PO TABS: 100.0000 mg | ORAL_TABLET | Freq: Every day | ORAL | 0 refills | Status: DC

## 2017-06-29 MED ORDER — FOLIC ACID 1 MG PO TABS: 1.0000 mg | ORAL_TABLET | Freq: Every day | ORAL | 0 refills | Status: DC

## 2017-06-29 NOTE — Progress Notes (Signed)
Neurosurgery Progress Note  No issues overnight.  No worsening numbness or weakness Would like to go home if possible  EXAM:  BP (!) 136/95 (BP Location: Left Arm)   Pulse 74   Temp 98 F (36.7 C) (Oral)   Resp 18   Ht 6' (1.829 m)   Wt 78 kg (172 lb)   SpO2 100%   BMI 23.33 kg/m   Awake, alert, oriented  Speech fluent, appropriate  MAEW with good strength, R>L  PLAN Stable this am from NS perspective Patient is agreeable to surgery but would like to be discharged home and then return back as an outpt for surgery. Believe this is reasonable. Will have office f/u with patient to schedule - tentatively for Tuesday.

## 2017-06-29 NOTE — Discharge Instructions (Signed)
Follow with Primary MD Judyann MunsonSnider, Cynthia, MD   Get CBC, CMP, 2checked  by Primary MD next visit.    Activity: As tolerated with Full fall precautions use walker/cane & assistance as needed   Disposition Home    Diet: Regular diet  For Heart failure patients - Check your Weight same time everyday, if you gain over 2 pounds, or you develop in leg swelling, experience more shortness of breath or chest pain, call your Primary MD immediately. Follow Cardiac Low Salt Diet and 1.5 lit/day fluid restriction.   On your next visit with your primary care physician please Get Medicines reviewed and adjusted.   Please request your Prim.MD to go over all Hospital Tests and Procedure/Radiological results at the follow up, please get all Hospital records sent to your Prim MD by signing hospital release before you go home.   If you experience worsening of your admission symptoms, develop shortness of breath, life threatening emergency, suicidal or homicidal thoughts you must seek medical attention immediately by calling 911 or calling your MD immediately  if symptoms less severe.  You Must read complete instructions/literature along with all the possible adverse reactions/side effects for all the Medicines you take and that have been prescribed to you. Take any new Medicines after you have completely understood and accpet all the possible adverse reactions/side effects.   Do not drive, operating heavy machinery, perform activities at heights, swimming or participation in water activities or provide baby sitting services if your were admitted for syncope or siezures until you have seen by Primary MD or a Neurologist and advised to do so again.  Do not drive when taking Pain medications.    Do not take more than prescribed Pain, Sleep and Anxiety Medications  Special Instructions: If you have smoked or chewed Tobacco  in the last 2 yrs please stop smoking, stop any regular Alcohol  and or any  Recreational drug use.  Wear Seat belts while driving.   Please note  You were cared for by a hospitalist during your hospital stay. If you have any questions about your discharge medications or the care you received while you were in the hospital after you are discharged, you can call the unit and asked to speak with the hospitalist on call if the hospitalist that took care of you is not available. Once you are discharged, your primary care physician will handle any further medical issues. Please note that NO REFILLS for any discharge medications will be authorized once you are discharged, as it is imperative that you return to your primary care physician (or establish a relationship with a primary care physician if you do not have one) for your aftercare needs so that they can reassess your need for medications and monitor your lab values.

## 2017-06-29 NOTE — Progress Notes (Signed)
Pt alert and oriented  for discharge going home with family at the bedside, no peripheral IV line, given health teachings, next appointment, due meds explained and understood, given all his personal belongings, no complain of pain at this time.

## 2017-06-29 NOTE — Discharge Summary (Signed)
[name], is a 57 y.o. male  DOB August 08, 1960  MRN [id number].  Admission date:  06/27/2017  Admitting Physician  Noralee Stain, DO  Discharge Date:  06/29/2017   Primary MD  [name], MD  Recommendations for primary care physician for things to follow:  -Patient to follow with neurosurgery as an outpatient   Admission Diagnosis  Left hand weakness [R29.898] Left sided numbness [R20.0]   Discharge Diagnosis  Left hand weakness [R29.898] Left sided numbness [R20.0]    Principal Problem:   Left-sided weakness Active Problems:   HIV disease (HCC)   Alcohol abuse with alcohol-induced mood disorder [facility])      Past Medical History:  Diagnosis Date  . 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 Hemorrhoids   . HIV (human immunodeficiency virus infection) (HCC)     Past Surgical History:  Procedure Laterality Date  . HERNIA REPAIR  97   lt ing  . RECTAL EXAM UNDER ANESTHESIA N/A 04/24/2014   Procedure:  ligation of bleeding vessels;  Surgeon: Almond Lint, MD;  Location: [facility];  Service: General;  Laterality: N/A;  . TENDON REPAIR     2006-lt index  . WART FULGURATION  07/06/2011   Procedure: FULGURATION ANAL WART;  Surgeon: Shelly Rubenstein, MD;  Location: [facility];  Service: General;  Laterality: N/A;  excision anal condyloma  . WART FULGURATION N/A 04/23/2014   Procedure: EXCISION OF ANAL CONDYLOMA;  Surgeon: Abigail Miyamoto, MD;  Location: [facility];  Service: General;  Laterality: N/A;       History of present illness and  Hospital Course:     Kindly see H&P for history of present illness and admission details, please review complete Labs, Consult reports and Test reports for all details in brief  HPI  from the history and physical done on the day of admission  HPI: [name] is a 57 y.o. male with medical history significant of HIV and right knee OA who presents  with 5 day history of left sided numbness, tingling, weakness.  He states that the numbness begins from left side of his neck that radiates down his left arm and into all 5 fingers of his left hand.  He also notes numbness in the entire left leg.  He admits to weakness of the left upper extremity and left lower extremity.  He states that he has a difficult time lifting his left leg to ambulate and has been needing to use a friend's cane for the past 5 days.  He has a chronic right knee osteoarthritis and right leg weakness that is chronic in nature.  He has no other complaints of slurred speech, visual deficits.  Denies any recent illnesses, denies any fevers, chills, chest pain, cough, shortness of breath, nausea, vomiting, diarrhea, abdominal pain.  He states that in the past 5 days, he has fallen because of weakness of his left lower extremity.  ED Course: CT head was stable with no acute intracranial abnormality seen. Teleneurology evaluated patient and recommends MRI brain  and c spine which has been ordered and pending at this time.      Hospital Course    Severe spinal stenosis, C3-C4 with impingement of the cervical spinal cord.  Patient with worsening symptoms, on the left upper and lower extremity.  He was seen by neurosurgery who offered surgical intervention, patient would like to finish the work-up as an outpatient, he is supposed to see neurosurgery next Tuesday as an outpatient.   HIV. Continue antiretroviral therapy, seems to be stable, no signs of opportunistic infections, patient not on prophylactic antibiotics, follow with ID clinic.   Alcohol abuse.  No signs of withdrawal during hospital stay, he will be discharged on thiamine and folic acid  Depression. Continue with Wellbutrin.      Discharge Condition: stable   Follow UP  Follow-up Information    Costella, Darci Current, PA-C Follow up.   Specialty:  Physician Assistant Contact information: [address]  [place] [address] [phone]             Discharge Instructions  and  Discharge Medications     Discharge Instructions    Diet - low sodium heart healthy   Complete by:  As directed    Discharge instructions   Complete by:  As directed    Follow with Primary MD [name], MD   Get CBC, CMP, 2checked  by Primary MD next visit.    Activity: As tolerated with Full fall precautions use walker/cane & assistance as needed   Disposition Home    Diet: Regular diet  For Heart failure patients - Check your Weight same time everyday, if you gain over 2 pounds, or you develop in leg swelling, experience more shortness of breath or chest pain, call your Primary MD immediately. Follow Cardiac Low Salt Diet and 1.5 lit/day fluid restriction.   On your next visit with your primary care physician please Get Medicines reviewed and adjusted.   Please request your Prim.MD to go over all Hospital Tests and Procedure/Radiological results at the follow up, please get all Hospital records sent to your Prim MD by signing hospital release before you go home.   If you experience worsening of your admission symptoms, develop shortness of breath, life threatening emergency, suicidal or homicidal thoughts you must seek medical attention immediately by calling 911 or calling your MD immediately  if symptoms less severe.  You Must read complete instructions/literature along with all the possible adverse reactions/side effects for all the Medicines you take and that have been prescribed to you. Take any new Medicines after you have completely understood and accpet all the possible adverse reactions/side effects.   Do not drive, operating heavy machinery, perform activities at heights, swimming or participation in water activities or provide baby sitting services if your were admitted for syncope or siezures until you have seen by Primary MD or a Neurologist and advised to do so again.  Do  not drive when taking Pain medications.    Do not take more than prescribed Pain, Sleep and Anxiety Medications  Special Instructions: If you have smoked or chewed Tobacco  in the last 2 yrs please stop smoking, stop any regular Alcohol  and or any Recreational drug use.  Wear Seat belts while driving.   Please note  You were cared for by a hospitalist during your hospital stay. If you have any questions about your discharge medications or the care you received while you were in the hospital after you are discharged, you  can call the unit and asked to speak with the hospitalist on call if the hospitalist that took care of you is not available. Once you are discharged, your primary care physician will handle any further medical issues. Please note that NO REFILLS for any discharge medications will be authorized once you are discharged, as it is imperative that you return to your primary care physician (or establish a relationship with a primary care physician if you do not have one) for your aftercare needs so that they can reassess your need for medications and monitor your lab values.   Increase activity slowly   Complete by:  As directed      Allergies as of 06/29/2017      Reactions   Shellfish Allergy Anaphylaxis, Swelling   Swelling everywhere   Sulfa Antibiotics Anaphylaxis, Hives, Itching, Swelling   Swelling all over       Medication List    STOP taking these medications   ibuprofen 800 MG tablet Commonly known as:  ADVIL,MOTRIN     TAKE these medications   acetaminophen 325 MG tablet Commonly known as:  TYLENOL Take 2 tablets (650 mg total) by mouth every 6 (six) hours as needed for mild pain (or Fever >/= 101).   buPROPion 300 MG 24 hr tablet Commonly known as:  WELLBUTRIN XL Take 1 tablet (300 mg total) by mouth every morning. For depression   darunavir-cobicistat 800-150 MG tablet Commonly known as:  PREZCOBIX Take 1 tablet by mouth daily. Swallow whole. Do NOT  crush, break or chew tablets. Take with food, take with Descovy.   diphenhydrAMINE 50 MG tablet Commonly known as:  BENADRYL Take 100 mg by mouth at bedtime as needed for sleep.   emtricitabine-tenofovir AF 200-25 MG tablet Commonly known as:  DESCOVY Take 1 tablet by mouth daily. Take with Prezcobix.   folic acid 1 MG tablet Commonly known as:  FOLVITE Take 1 tablet (1 mg total) by mouth daily. Start taking on:  06/30/2017   thiamine 100 MG tablet Take 1 tablet (100 mg total) by mouth daily. Start taking on:  06/30/2017         Diet and Activity recommendation: See Discharge Instructions above   Consults obtained -  neurosurgery   Major procedures and Radiology Reports - PLEASE review detailed and final reports for all details, in brief -     Dg Knee 1-2 Views Right  Result Date: 06/28/2017 CLINICAL DATA:  Progressive knee pain for 2 years. History of arthritis. EXAM: RIGHT KNEE - 1-2 VIEW COMPARISON:  RIGHT knee radiograph October 10, 2017 FINDINGS: Stable mild tricompartmental joint space narrowing with mild marginal spurring and periarticular sclerosis. No fracture deformity or dislocation. No destructive bony lesions. Soft tissue planes are non suspicious. IMPRESSION: 1. No acute fracture deformity dislocation. 2. Stable mild tricompartmental osteoarthrosis. Electronically Signed   By: Awilda Metro M.D.   On: 06/28/2017 18:37   Ct Head Wo Contrast  Result Date: 06/27/2017 CLINICAL DATA:  57 year old male with left side numbness and weakness 4 days. Hand weakness. HIV positive. EXAM: CT HEAD WITHOUT CONTRAST TECHNIQUE: Contiguous axial images were obtained from the base of the skull through the vertex without intravenous contrast. COMPARISON:  Head CTs 09/25/2016 and earlier. FINDINGS: Brain: Cerebral volume is stable and within normal limits. No midline shift, ventriculomegaly, mass effect, evidence of mass lesion, intracranial hemorrhage or evidence of cortically  based acute infarction. Gray-white matter differentiation throughout the brain appears stable and within normal limits. No cortical  encephalomalacia identified. Vascular: No suspicious intracranial vascular hyperdensity. Skull: Negative. Sinuses/Orbits: Visualized paranasal sinuses and mastoids are stable and well pneumatized. Other: Visualized orbits and scalp soft tissues are within normal limits. IMPRESSION: Stable and normal noncontrast CT appearance of the brain. No acute intracranial abnormality identified. Electronically Signed   By: Odessa Fleming M.D.   On: 06/27/2017 14:47   Mr Brain Wo Contrast (neuro Protocol)  Result Date: 06/27/2017 CLINICAL DATA:  Initial evaluation for 5 day history of left-sided numbness. EXAM: MRI HEAD WITHOUT CONTRAST MRI CERVICAL SPINE WITHOUT CONTRAST TECHNIQUE: Multiplanar, multiecho pulse sequences of the brain and surrounding structures, and cervical spine, to include the craniocervical junction and cervicothoracic junction, were obtained without intravenous contrast. COMPARISON:  Prior CT from earlier the same day. FINDINGS: MRI HEAD FINDINGS Brain: Generalized age-related cerebral atrophy. Few scattered mild noted within the periventricular and deep white matter both cerebral hemispheres, nonspecific, but felt to be within normal limits for age. No evidence for acute or subacute ischemia. Gray-white matter differentiation maintained. No evidence for cortical infarction. No evidence for acute or chronic intracranial hemorrhage. No mass lesion, midline shift or mass effect. No hydrocephalus. No extra-axial fluid collection. Pituitary gland within normal limits. Vascular: Major intracranial vascular flow voids are maintained. Hypoplastic left vertebral artery not visualized. Skull and upper cervical spine: Craniocervical junction within normal limits. Bone marrow signal intensity normal. No scalp soft tissue abnormality. Sinuses/Orbits: Globes and orbital soft tissues within  normal limits. Paranasal sinuses are largely clear. No mastoid effusion. Other: None. MRI CERVICAL SPINE FINDINGS Alignment: Straightening with reversal of the normal cervical lordosis, apex at C3-4. Trace chronic facet mediated retrolisthesis of C3 on C4, C5 on C6, C6 on C7. Vertebrae: Vertebral body heights maintained without evidence for acute or chronic fracture. Bone marrow signal intensity within normal limits. No discrete or worrisome osseous lesions. Heterogeneous signal intensity within the C3 through C6 vertebral bodies consistent with degenerative changes. Cord: Abnormal T2 signal intensity within the bilateral cervical spinal cord at the level of C3-4, consistent with myelomalacia (series 3, image 8). Changes slightly more prominent on the left. Signal intensity within the cervical spinal cord otherwise normal. Posterior Fossa, vertebral arteries, paraspinal tissues: Craniocervical junction within normal limits. Paraspinous and prevertebral soft tissues normal. Normal intravascular flow void within the right vertebral artery. Hypoplastic left vertebral artery not well visualized. Disc levels: C2-C3: Negative interspace. Prominent right-sided facet degeneration. Resultant mild right C3 foraminal stenosis. No significant canal narrowing. C3-C4: Chronic diffuse degenerative disc osteophyte with intervertebral disc space narrowing. Prominent posterior component effaces the ventral CSF with severe flattening of the cervical spinal cord. Severe stenosis with the thecal sac measuring 3-4 mm at its most narrow point. Associated myelomalacia noted. Severe bilateral C4 foraminal stenosis. C4-C5: Chronic diffuse degenerative disc osteophyte with bilateral uncovertebral spurring. Flattening of the ventral CSF with moderate spinal stenosis. Associated cord flattening without cord signal changes, greater on the right. Severe right with moderate left C5 foraminal stenosis. C5-C6: Diffuse disc bulge with right worse  than left uncovertebral spurring. Resultant mild spinal stenosis without significant cord deformity. Severe bilateral C6 foraminal narrowing, right worse than left. C6-C7: Diffuse disc bulge with bilateral uncovertebral spurring. Flattening of the ventral CSF. Mild facet hypertrophy. Resultant mild to moderate spinal stenosis without cord deformity. Severe bilateral C7 foraminal stenosis. C7-T1: Mild facet hypertrophy. No significant canal stenosis. Mild bilateral C8 foraminal narrowing. Upper thoracic spine demonstrates no significant finding. IMPRESSION: MRI HEAD IMPRESSION Normal brain MRI for age.  No acute intracranial  abnormality. MRI CERVICAL SPINE IMPRESSION 1. Severe degenerative disc osteophyte at C3-4 with resultant severe spinal stenosis and impingement of the cervical spinal cord. Associated cord signal abnormality at this level consistent with myelomalacia. Finding could result in patient's symptoms. 2. 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 Severe bilateral C4 foraminal stenosis, severe right with moderate left C5 foraminal narrowing, with severe bilateral C6 and C7 foraminal stenosis Electronically Signed   By: Rise Mu M.D.   On: 06/27/2017 21:44   Mr Cervical Spine Wo Contrast  Result Date: 06/27/2017 CLINICAL DATA:  Initial evaluation for 5 day history of left-sided numbness. EXAM: MRI HEAD WITHOUT CONTRAST MRI CERVICAL SPINE WITHOUT CONTRAST TECHNIQUE: Multiplanar, multiecho pulse sequences of the brain and surrounding structures, and cervical spine, to include the craniocervical junction and cervicothoracic junction, were obtained without intravenous contrast. COMPARISON:  Prior CT from earlier the same day. FINDINGS: MRI HEAD FINDINGS Brain: Generalized age-related cerebral atrophy. Few scattered mild noted within the periventricular and deep white matter both cerebral  hemispheres, nonspecific, but felt to be within normal limits for age. No evidence for acute or subacute ischemia. Gray-white matter differentiation maintained. No evidence for cortical infarction. No evidence for acute or chronic intracranial hemorrhage. No mass lesion, midline shift or mass effect. No hydrocephalus. No extra-axial fluid collection. Pituitary gland within normal limits. Vascular: Major intracranial vascular flow voids are maintained. Hypoplastic left vertebral artery not visualized. Skull and upper cervical spine: Craniocervical junction within normal limits. Bone marrow signal intensity normal. No scalp soft tissue abnormality. Sinuses/Orbits: Globes and orbital soft tissues within normal limits. Paranasal sinuses are largely clear. No mastoid effusion. Other: None. MRI CERVICAL SPINE FINDINGS Alignment: Straightening with reversal of the normal cervical lordosis, apex at C3-4. Trace chronic facet mediated retrolisthesis of C3 on C4, C5 on C6, C6 on C7. Vertebrae: Vertebral body heights maintained without evidence for acute or chronic fracture. Bone marrow signal intensity within normal limits. No discrete or worrisome osseous lesions. Heterogeneous signal intensity within the C3 through C6 vertebral bodies consistent with degenerative changes. Cord: Abnormal T2 signal intensity within the bilateral cervical spinal cord at the level of C3-4, consistent with myelomalacia (series 3, image 8). Changes slightly more prominent on the left. Signal intensity within the cervical spinal cord otherwise normal. Posterior Fossa, vertebral arteries, paraspinal tissues: Craniocervical junction within normal limits. Paraspinous and prevertebral soft tissues normal. Normal intravascular flow void within the right vertebral artery. Hypoplastic left vertebral artery not well visualized. Disc levels: C2-C3: Negative interspace. Prominent right-sided facet degeneration. Resultant mild right C3 foraminal stenosis. No  significant canal narrowing. C3-C4: Chronic diffuse degenerative disc osteophyte with intervertebral disc space narrowing. Prominent posterior component effaces the ventral CSF with severe flattening of the cervical spinal cord. Severe stenosis with the thecal sac measuring 3-4 mm at its most narrow point. Associated myelomalacia noted. Severe bilateral C4 foraminal stenosis. C4-C5: Chronic diffuse degenerative disc osteophyte with bilateral uncovertebral spurring. Flattening of the ventral CSF with moderate spinal stenosis. Associated cord flattening without cord signal changes, greater on the right. Severe right with moderate left C5 foraminal stenosis. C5-C6: Diffuse disc bulge with right worse than left uncovertebral spurring. Resultant mild spinal stenosis without significant cord deformity. Severe bilateral C6 foraminal narrowing, right worse than left. C6-C7: Diffuse disc bulge with bilateral uncovertebral spurring. Flattening of the ventral CSF. Mild facet hypertrophy. Resultant mild to moderate spinal stenosis without cord deformity. Severe bilateral C7  foraminal stenosis. C7-T1: Mild facet hypertrophy. No significant canal stenosis. Mild bilateral C8 foraminal narrowing. Upper thoracic spine demonstrates no significant finding. IMPRESSION: MRI HEAD IMPRESSION Normal brain MRI for age.  No acute intracranial abnormality. MRI CERVICAL SPINE IMPRESSION 1. Severe degenerative disc osteophyte at C3-4 with resultant severe spinal stenosis and impingement of the cervical spinal cord. Associated cord signal abnormality at this level consistent with myelomalacia. Finding could result in patient's symptoms. 2. 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 Severe bilateral C4 foraminal stenosis, severe right with moderate left C5 foraminal narrowing, with severe bilateral C6 and C7 foraminal stenosis  Electronically Signed   By: Rise MuBenjamin  McClintock M.D.   On: 06/27/2017 21:44    Micro Results    No results found for this or any previous visit (from the past 240 hour(s)).     Today   Subjective:   Valentino NoseAnthony [name] today has no headache,no chest or  abdominal pain,no new weakness tingling or numbness, he wants to go home today.   Objective:   Blood pressure 124/90, pulse 74, temperature 98.1 F (36.7 C), temperature source Oral, resp. rate 18, height 6' (1.829 m), weight 78 kg (172 lb), SpO2 99 %.   Intake/Output Summary (Last 24 hours) at 06/29/2017 1345 Last data filed at 06/29/2017 0508 Gross per 24 hour  Intake 360 ml  Output -  Net 360 ml    Exam Awake Alert, Oriented x 3, No new F.N deficits, Normal affect Symmetrical Chest wall movement, Good air movement bilaterally, CTAB RRR,No Gallops,Rubs or new Murmurs, No Parasternal Heave +ve B.Sounds, Abd Soft, Non tender, No organomegaly appriciated, No rebound -guarding or rigidity. No Cyanosis, Clubbing or edema, No new Rash or bruise  Data Review   CBC w Diff:  Lab Results  Component Value Date   WBC 5.1 06/28/2017   HGB 12.5 (L) 06/28/2017   HCT 38.0 (L) 06/28/2017   PLT 256 06/28/2017   LYMPHOPCT 39 06/27/2017   MONOPCT 8 06/27/2017   EOSPCT 1 06/27/2017   BASOPCT 0 06/27/2017    CMP:  Lab Results  Component Value Date   NA 139 06/29/2017   K 4.1 06/29/2017   CL 107 06/29/2017   CO2 28 06/29/2017   BUN 14 06/29/2017   CREATININE 1.25 (H) 06/29/2017   CREATININE 1.26 03/14/2017   PROT 8.8 (H) 06/27/2017   ALBUMIN 5.0 06/27/2017   BILITOT 1.4 (H) 06/27/2017   ALKPHOS 42 06/27/2017   AST 32 06/27/2017   ALT 27 06/27/2017  .   Total Time in preparing paper work, data evaluation and todays exam - 30 minutes  Huey Bienenstockawood Gwendelyn Lanting M.D on 06/29/2017 at 1:45 PM  [facility]   Office  [phone]

## 2017-06-29 NOTE — Progress Notes (Signed)
Patient requested for IV to be taken out of arm. Pt states "it's hurting really bad". Pt would like for IV to stay out until further assessment by MD.

## 2017-06-29 NOTE — Evaluation (Signed)
Occupational Therapy Evaluation and Discharge Patient Details Name: [name] MRN: [id number] DOB: 08-22-1960 Today's Date: 06/29/2017    History of Present Illness 57 yo male admitted with L sided weakness, falls. MRI (+) c3-c4 severe spinal stenosis with impingment of spinal cord. Hx of HIV, OA, ETOH abuse, polysubstance abuse.    Clinical Impression   Pt is functioning independently in ADL and ambulating with a cane. He has many concerns and questions regarding ACDF and is awaiting neurosurgeon's visit. No current OT needs, but did educate in cervical precautions.    Follow Up Recommendations  No OT follow up    Equipment Recommendations  None recommended by OT    Recommendations for Other Services       Precautions / Restrictions Precautions Precautions: Fall Precaution Comments: educated in cervical precautions, although not formally ordered Restrictions Weight Bearing Restrictions: No      Mobility Bed Mobility Overal bed mobility: Independent                Transfers Overall transfer level: Modified independent Equipment used: Straight cane                  Balance             Standing balance-Leahy Scale: Good                             ADL either performed or assessed with clinical judgement   ADL Overall ADL's : Independent                                             Vision Baseline Vision/History: No visual deficits Patient Visual Report: No change from baseline       Perception     Praxis      Pertinent Vitals/Pain Pain Assessment: No/denies pain     Hand Dominance Right   Extremity/Trunk Assessment Upper Extremity Assessment Upper Extremity Assessment: Overall WFL for tasks assessed   Lower Extremity Assessment Lower Extremity Assessment: Defer to PT evaluation       Communication Communication Communication: No difficulties   Cognition Arousal/Alertness:  Awake/alert Behavior During Therapy: WFL for tasks assessed/performed Overall Cognitive Status: Within Functional Limits for tasks assessed                                     General Comments       Exercises     Shoulder Instructions      Home Living Family/patient expects to be discharged to:: Private residence Living Arrangements: Alone Available Help at Discharge: Family;Available PRN/intermittently Type of Home: Apartment Home Access: Stairs to enter Entrance Stairs-Number of Steps: 4 Entrance Stairs-Rails: Right Home Layout: One level     Bathroom Shower/Tub: Chief Strategy Officer: Standard     Home Equipment: Cane - single point   Additional Comments: borrowed cane      Prior Functioning/Environment Level of Independence: Independent                 OT Problem List:        OT Treatment/Interventions:      OT Goals(Current goals can be found in the care plan section) Acute Rehab OT Goals Patient Stated Goal: figure out what is  going on with his neck  OT Frequency:     Barriers to D/C:            Co-evaluation              AM-PAC PT "6 Clicks" Daily Activity     Outcome Measure Help from another person eating meals?: None Help from another person taking care of personal grooming?: None Help from another person toileting, which includes using toliet, bedpan, or urinal?: None Help from another person bathing (including washing, rinsing, drying)?: None Help from another person to put on and taking off regular upper body clothing?: None Help from another person to put on and taking off regular lower body clothing?: None 6 Click Score: 24   End of Session    Activity Tolerance: Patient tolerated treatment well Patient left: in bed;with call bell/phone within reach  OT Visit Diagnosis: Muscle weakness (generalized) (M62.81)                Time: [id number] OT Time Calculation (min): 27 min Charges:  OT General  Charges $OT Visit: 1 Visit OT Evaluation $OT Eval Low Complexity: 1 Low OT Treatments $Self Care/Home Management : 8-22 mins G-Codes:     06/29/2017 Martie RoundJulie [place], OTR/L Pager: [phone]  Iran PlanasMayberry, [place] BailiffJulie Lynn 06/29/2017, 10:27 AM

## 2017-07-02 ENCOUNTER — Other Ambulatory Visit: Payer: Self-pay | Admitting: Neurosurgery

## 2017-07-02 NOTE — Progress Notes (Signed)
Attempted several times to contact pt. Unable to reach him, left detailed pre-op instructions on pt's voicemail.

## 2017-07-03 ENCOUNTER — Ambulatory Visit (HOSPITAL_COMMUNITY)
Admission: RE | Admit: 2017-07-03 | Discharge: 2017-07-03 | Disposition: A | Discharge status: Home / Self Care | Payer: Medicare Other | Source: Ambulatory Visit | Attending: Neurosurgery | Admitting: Neurosurgery

## 2017-07-03 ENCOUNTER — Encounter (HOSPITAL_COMMUNITY)
Admission: RE | Disposition: A | Discharge status: Home / Self Care | Payer: Self-pay | Source: Ambulatory Visit | Attending: Neurosurgery

## 2017-07-03 DIAGNOSIS — Z5309 Procedure and treatment not carried out because of other contraindication: Secondary | ICD-10-CM | POA: Insufficient documentation

## 2017-07-03 SURGERY — ANTERIOR CERVICAL DECOMPRESSION/DISCECTOMY FUSION 2 LEVELS
Anesthesia: General

## 2017-07-03 MED ORDER — SUGAMMADEX SODIUM 200 MG/2ML IV SOLN
INTRAVENOUS | Status: AC
  Filled 2017-07-03: qty 2

## 2017-07-03 MED ORDER — MIDAZOLAM HCL 2 MG/2ML IJ SOLN
INTRAMUSCULAR | Status: AC
  Filled 2017-07-03: qty 2

## 2017-07-03 MED ORDER — MUPIROCIN 2 % EX OINT: 1.0000 "application " | TOPICAL_OINTMENT | Freq: Once | CUTANEOUS | Status: DC

## 2017-07-03 MED ORDER — DEXAMETHASONE SODIUM PHOSPHATE 10 MG/ML IJ SOLN
INTRAMUSCULAR | Status: AC
  Filled 2017-07-03: qty 1

## 2017-07-03 MED ORDER — BUPIVACAINE HCL (PF) 0.5 % IJ SOLN
INTRAMUSCULAR | Status: AC
  Filled 2017-07-03: qty 30

## 2017-07-03 MED ORDER — LIDOCAINE-EPINEPHRINE 1 %-1:100000 IJ SOLN
INTRAMUSCULAR | Status: AC
  Filled 2017-07-03: qty 1

## 2017-07-03 MED ORDER — PROPOFOL 10 MG/ML IV BOLUS
INTRAVENOUS | Status: AC
  Filled 2017-07-03: qty 20

## 2017-07-03 MED ORDER — THROMBIN 5000 UNITS EX SOLR
CUTANEOUS | Status: AC
  Filled 2017-07-03: qty 5000

## 2017-07-03 MED ORDER — THROMBIN 20000 UNITS EX SOLR
CUTANEOUS | Status: AC
  Filled 2017-07-03: qty 20000

## 2017-07-03 MED ORDER — LACTATED RINGERS IV SOLN: INTRAVENOUS | Status: DC

## 2017-07-03 MED ORDER — MUPIROCIN 2 % EX OINT
TOPICAL_OINTMENT | CUTANEOUS | Status: AC
  Filled 2017-07-03: qty 22

## 2017-07-03 MED ORDER — ONDANSETRON HCL 4 MG/2ML IJ SOLN
INTRAMUSCULAR | Status: AC
  Filled 2017-07-03: qty 2

## 2017-07-03 MED ORDER — PHENYLEPHRINE 40 MCG/ML (10ML) SYRINGE FOR IV PUSH (FOR BLOOD PRESSURE SUPPORT)
PREFILLED_SYRINGE | INTRAVENOUS | Status: AC
  Filled 2017-07-03: qty 10

## 2017-07-03 MED ORDER — FENTANYL CITRATE (PF) 250 MCG/5ML IJ SOLN
INTRAMUSCULAR | Status: AC
  Filled 2017-07-03: qty 5

## 2017-07-03 NOTE — OR Nursing (Signed)
Surgery cancelled per Dr Conchita ParisNundkumar due to patient not being NPO.

## 2017-07-03 NOTE — Progress Notes (Signed)
Dr Conchita ParisNundkumar in to speak to patient re: NPO status. Case to be rescheduled to Friday 6/28. Contact information reviewed in [facility]. Patient verbalized understanding to remain NPO after midnight Thursday. Patient dressed and walked to [place] entrance with his sister.

## 2017-07-04 ENCOUNTER — Other Ambulatory Visit: Payer: Self-pay | Admitting: Neurosurgery

## 2017-07-05 ENCOUNTER — Encounter (HOSPITAL_COMMUNITY): Payer: Self-pay | Admitting: *Deleted

## 2017-07-05 ENCOUNTER — Other Ambulatory Visit: Payer: Self-pay

## 2017-07-05 NOTE — Progress Notes (Signed)
Pt denies SOB, chest pain, and being under the care of a cardiologist. Pt denies having a stress test, echo and cardiac cath. Pt made aware to stop taking Aspirin (unless advised otherwise by surgeon), vitamins, fish oil and herbal medications. Do not take any NSAIDs ie: Ibuprofen, Advil, Naproxen (Aleve), Motrin, BC and Goody Powder. Pt verbalized understanding of all pre-op instructions.

## 2017-07-06 ENCOUNTER — Ambulatory Visit (HOSPITAL_COMMUNITY): Payer: Medicare Other | Admitting: Certified Registered Nurse Anesthetist

## 2017-07-06 ENCOUNTER — Encounter (HOSPITAL_COMMUNITY)
Admission: RE | Disposition: A | Discharge status: Home Health | Payer: Self-pay | Source: Home / Self Care | Attending: Neurosurgery

## 2017-07-06 ENCOUNTER — Ambulatory Visit (HOSPITAL_COMMUNITY): Payer: Medicare Other

## 2017-07-06 ENCOUNTER — Encounter (HOSPITAL_COMMUNITY): Payer: Self-pay | Admitting: General Practice

## 2017-07-06 ENCOUNTER — Other Ambulatory Visit: Payer: Self-pay

## 2017-07-06 ENCOUNTER — Inpatient Hospital Stay (HOSPITAL_COMMUNITY)
Admission: RE | Admit: 2017-07-06 | Discharge: 2017-07-07 | DRG: 473 | Disposition: A | Discharge status: Home Health | Payer: Medicare Other | Attending: Neurosurgery | Admitting: Neurosurgery

## 2017-07-06 DIAGNOSIS — M4802 Spinal stenosis, cervical region: Secondary | ICD-10-CM

## 2017-07-06 DIAGNOSIS — Z91013 Allergy to seafood: Secondary | ICD-10-CM

## 2017-07-06 DIAGNOSIS — Z882 Allergy status to sulfonamides status: Secondary | ICD-10-CM | POA: Diagnosis not present

## 2017-07-06 DIAGNOSIS — G8194 Hemiplegia, unspecified affecting left nondominant side: Secondary | ICD-10-CM | POA: Diagnosis present

## 2017-07-06 DIAGNOSIS — Z21 Asymptomatic human immunodeficiency virus [HIV] infection status: Secondary | ICD-10-CM

## 2017-07-06 DIAGNOSIS — F17210 Nicotine dependence, cigarettes, uncomplicated: Secondary | ICD-10-CM | POA: Diagnosis present

## 2017-07-06 DIAGNOSIS — Z79899 Other long term (current) drug therapy: Secondary | ICD-10-CM

## 2017-07-06 DIAGNOSIS — M4712 Other spondylosis with myelopathy, cervical region: Principal | ICD-10-CM | POA: Diagnosis present

## 2017-07-06 DIAGNOSIS — G992 Myelopathy in diseases classified elsewhere: Secondary | ICD-10-CM | POA: Diagnosis present

## 2017-07-06 HISTORY — DX: Spinal stenosis, cervical region: M48.02

## 2017-07-06 HISTORY — PX: ANTERIOR CERVICAL DECOMP/DISCECTOMY FUSION: SHX1161

## 2017-07-06 LAB — TYPE AND SCREEN
ABO/RH(D): A POS
Antibody Screen: NEGATIVE

## 2017-07-06 LAB — SURGICAL PCR SCREEN
MRSA, PCR: NEGATIVE
STAPHYLOCOCCUS AUREUS: NEGATIVE

## 2017-07-06 LAB — ABO/RH: ABO/RH(D): A POS

## 2017-07-06 SURGERY — ANTERIOR CERVICAL DECOMPRESSION/DISCECTOMY FUSION 2 LEVELS
Anesthesia: General

## 2017-07-06 MED ORDER — GABAPENTIN 300 MG PO CAPS
300.0000 mg | ORAL_CAPSULE | Freq: Three times a day (TID) | ORAL | Status: DC
  Administered 2017-07-06 – 2017-07-07 (×3): 300 mg via ORAL
  Filled 2017-07-06 (×3): qty 1

## 2017-07-06 MED ORDER — CEFAZOLIN SODIUM-DEXTROSE 2-4 GM/100ML-% IV SOLN
2.0000 g | INTRAVENOUS | Status: AC
  Administered 2017-07-06: 2 g via INTRAVENOUS

## 2017-07-06 MED ORDER — EPHEDRINE SULFATE 50 MG/ML IJ SOLN
INTRAMUSCULAR | Status: AC
  Filled 2017-07-06: qty 1

## 2017-07-06 MED ORDER — SODIUM CHLORIDE 0.9% FLUSH
3.0000 mL | Freq: Two times a day (BID) | INTRAVENOUS | Status: DC
  Administered 2017-07-06: 3 mL via INTRAVENOUS

## 2017-07-06 MED ORDER — HYDROMORPHONE HCL 1 MG/ML IJ SOLN
INTRAMUSCULAR | Status: AC
  Administered 2017-07-06: 0.5 mg via INTRAVENOUS
  Filled 2017-07-06: qty 1

## 2017-07-06 MED ORDER — HYDROMORPHONE HCL 1 MG/ML IJ SOLN
0.5000 mg | INTRAMUSCULAR | Status: AC | PRN
  Administered 2017-07-06 (×4): 0.5 mg via INTRAVENOUS

## 2017-07-06 MED ORDER — DEXAMETHASONE SODIUM PHOSPHATE 4 MG/ML IJ SOLN
INTRAMUSCULAR | Status: DC | PRN
  Administered 2017-07-06: 10 mg via INTRAVENOUS

## 2017-07-06 MED ORDER — ROCURONIUM BROMIDE 50 MG/5ML IV SOLN
INTRAVENOUS | Status: AC
  Filled 2017-07-06: qty 1

## 2017-07-06 MED ORDER — SENNOSIDES-DOCUSATE SODIUM 8.6-50 MG PO TABS: 1.0000 | ORAL_TABLET | Freq: Every evening | ORAL | Status: DC | PRN

## 2017-07-06 MED ORDER — THROMBIN 5000 UNITS EX SOLR
CUTANEOUS | Status: AC
  Filled 2017-07-06: qty 5000

## 2017-07-06 MED ORDER — PROPOFOL 10 MG/ML IV BOLUS
INTRAVENOUS | Status: DC | PRN
  Administered 2017-07-06: 180 mg via INTRAVENOUS

## 2017-07-06 MED ORDER — HYDROMORPHONE HCL 1 MG/ML IJ SOLN
INTRAMUSCULAR | Status: AC
  Filled 2017-07-06: qty 1

## 2017-07-06 MED ORDER — SODIUM CHLORIDE 0.9 % IV SOLN
INTRAVENOUS | Status: DC | PRN
  Administered 2017-07-06: 13:00:00

## 2017-07-06 MED ORDER — SUGAMMADEX SODIUM 200 MG/2ML IV SOLN
INTRAVENOUS | Status: DC | PRN
  Administered 2017-07-06: 200 mg via INTRAVENOUS

## 2017-07-06 MED ORDER — MENTHOL 3 MG MT LOZG: 1.0000 | LOZENGE | OROMUCOSAL | Status: DC | PRN

## 2017-07-06 MED ORDER — ACETAMINOPHEN 500 MG PO TABS
1000.0000 mg | ORAL_TABLET | Freq: Four times a day (QID) | ORAL | Status: DC
  Administered 2017-07-06 – 2017-07-07 (×3): 1000 mg via ORAL
  Filled 2017-07-06 (×3): qty 2

## 2017-07-06 MED ORDER — MIDAZOLAM HCL 2 MG/2ML IJ SOLN
INTRAMUSCULAR | Status: AC
  Filled 2017-07-06: qty 2

## 2017-07-06 MED ORDER — ONDANSETRON HCL 4 MG/2ML IJ SOLN: 4.0000 mg | Freq: Four times a day (QID) | INTRAMUSCULAR | Status: DC | PRN

## 2017-07-06 MED ORDER — MIDAZOLAM HCL 5 MG/5ML IJ SOLN
INTRAMUSCULAR | Status: DC | PRN
  Administered 2017-07-06: 2 mg via INTRAVENOUS

## 2017-07-06 MED ORDER — LACTATED RINGERS IV SOLN
INTRAVENOUS | Status: DC
  Administered 2017-07-06 (×2): via INTRAVENOUS

## 2017-07-06 MED ORDER — SUGAMMADEX SODIUM 200 MG/2ML IV SOLN
INTRAVENOUS | Status: AC
  Filled 2017-07-06: qty 4

## 2017-07-06 MED ORDER — FENTANYL CITRATE (PF) 250 MCG/5ML IJ SOLN
INTRAMUSCULAR | Status: AC
  Filled 2017-07-06: qty 5

## 2017-07-06 MED ORDER — ACETAMINOPHEN 325 MG PO TABS: 650.0000 mg | ORAL_TABLET | ORAL | Status: DC | PRN

## 2017-07-06 MED ORDER — ONDANSETRON HCL 4 MG/2ML IJ SOLN: 4.0000 mg | Freq: Once | INTRAMUSCULAR | Status: DC | PRN

## 2017-07-06 MED ORDER — HYDROMORPHONE HCL 1 MG/ML IJ SOLN
0.5000 mg | INTRAMUSCULAR | Status: AC | PRN
  Administered 2017-07-06 (×2): 0.5 mg via INTRAVENOUS

## 2017-07-06 MED ORDER — LIDOCAINE-EPINEPHRINE 1 %-1:100000 IJ SOLN
INTRAMUSCULAR | Status: AC
  Filled 2017-07-06: qty 1

## 2017-07-06 MED ORDER — PROPOFOL 10 MG/ML IV BOLUS
INTRAVENOUS | Status: AC
  Filled 2017-07-06: qty 20

## 2017-07-06 MED ORDER — SENNA 8.6 MG PO TABS
1.0000 | ORAL_TABLET | Freq: Two times a day (BID) | ORAL | Status: DC
  Administered 2017-07-06 – 2017-07-07 (×2): 8.6 mg via ORAL
  Filled 2017-07-06 (×2): qty 1

## 2017-07-06 MED ORDER — CHLORHEXIDINE GLUCONATE CLOTH 2 % EX PADS: 6.0000 | MEDICATED_PAD | Freq: Once | CUTANEOUS | Status: DC

## 2017-07-06 MED ORDER — MUPIROCIN 2 % EX OINT
1.0000 "application " | TOPICAL_OINTMENT | Freq: Once | CUTANEOUS | Status: AC
  Administered 2017-07-06: 1 via TOPICAL

## 2017-07-06 MED ORDER — ACETAMINOPHEN 650 MG RE SUPP: 650.0000 mg | RECTAL | Status: DC | PRN

## 2017-07-06 MED ORDER — DEXAMETHASONE SODIUM PHOSPHATE 10 MG/ML IJ SOLN
INTRAMUSCULAR | Status: AC
  Filled 2017-07-06: qty 1

## 2017-07-06 MED ORDER — ROCURONIUM BROMIDE 100 MG/10ML IV SOLN
INTRAVENOUS | Status: DC | PRN
  Administered 2017-07-06: 10 mg via INTRAVENOUS
  Administered 2017-07-06 (×2): 20 mg via INTRAVENOUS
  Administered 2017-07-06: 50 mg via INTRAVENOUS

## 2017-07-06 MED ORDER — PHENYLEPHRINE HCL 10 MG/ML IJ SOLN
INTRAMUSCULAR | Status: DC | PRN
  Administered 2017-07-06: 80 ug via INTRAVENOUS
  Administered 2017-07-06: 120 ug via INTRAVENOUS

## 2017-07-06 MED ORDER — PHENYLEPHRINE 40 MCG/ML (10ML) SYRINGE FOR IV PUSH (FOR BLOOD PRESSURE SUPPORT)
PREFILLED_SYRINGE | INTRAVENOUS | Status: AC
  Filled 2017-07-06: qty 10

## 2017-07-06 MED ORDER — 0.9 % SODIUM CHLORIDE (POUR BTL) OPTIME
TOPICAL | Status: DC | PRN
  Administered 2017-07-06: 1000 mL

## 2017-07-06 MED ORDER — PHENOL 1.4 % MT LIQD: 1.0000 | OROMUCOSAL | Status: DC | PRN

## 2017-07-06 MED ORDER — METHOCARBAMOL 500 MG PO TABS
ORAL_TABLET | ORAL | Status: AC
  Filled 2017-07-06: qty 1

## 2017-07-06 MED ORDER — GELATIN ABSORBABLE MT POWD
OROMUCOSAL | Status: DC | PRN
  Administered 2017-07-06: 13:00:00 via TOPICAL

## 2017-07-06 MED ORDER — DOCUSATE SODIUM 100 MG PO CAPS
100.0000 mg | ORAL_CAPSULE | Freq: Two times a day (BID) | ORAL | Status: DC
  Administered 2017-07-06 – 2017-07-07 (×2): 100 mg via ORAL
  Filled 2017-07-06 (×2): qty 1

## 2017-07-06 MED ORDER — LIDOCAINE HCL (CARDIAC) PF 100 MG/5ML IV SOSY
PREFILLED_SYRINGE | INTRAVENOUS | Status: DC | PRN
  Administered 2017-07-06: 80 mg via INTRAVENOUS

## 2017-07-06 MED ORDER — DARUNAVIR-COBICISTAT 800-150 MG PO TABS
1.0000 | ORAL_TABLET | Freq: Every day | ORAL | Status: DC
  Administered 2017-07-06: 1 via ORAL
  Filled 2017-07-06: qty 1

## 2017-07-06 MED ORDER — EMTRICITABINE-TENOFOVIR AF 200-25 MG PO TABS
1.0000 | ORAL_TABLET | Freq: Every day | ORAL | Status: DC
  Administered 2017-07-06 – 2017-07-07 (×2): 1 via ORAL
  Filled 2017-07-06 (×2): qty 1

## 2017-07-06 MED ORDER — LIDOCAINE-EPINEPHRINE 1 %-1:100000 IJ SOLN
INTRAMUSCULAR | Status: DC | PRN
  Administered 2017-07-06: 4 mL

## 2017-07-06 MED ORDER — HYDROMORPHONE HCL 1 MG/ML IJ SOLN
0.2500 mg | INTRAMUSCULAR | Status: DC | PRN
  Administered 2017-07-06 (×4): 0.5 mg via INTRAVENOUS

## 2017-07-06 MED ORDER — ONDANSETRON HCL 4 MG/2ML IJ SOLN
INTRAMUSCULAR | Status: AC
  Filled 2017-07-06: qty 4

## 2017-07-06 MED ORDER — METHOCARBAMOL 1000 MG/10ML IJ SOLN
500.0000 mg | Freq: Four times a day (QID) | INTRAMUSCULAR | Status: DC | PRN
  Filled 2017-07-06: qty 5

## 2017-07-06 MED ORDER — BUPIVACAINE HCL (PF) 0.5 % IJ SOLN
INTRAMUSCULAR | Status: AC
  Filled 2017-07-06: qty 30

## 2017-07-06 MED ORDER — LIDOCAINE 2% (20 MG/ML) 5 ML SYRINGE
INTRAMUSCULAR | Status: AC
  Filled 2017-07-06: qty 10

## 2017-07-06 MED ORDER — ROCURONIUM BROMIDE 50 MG/5ML IV SOLN
INTRAVENOUS | Status: AC
  Filled 2017-07-06: qty 4

## 2017-07-06 MED ORDER — HYDROMORPHONE HCL 1 MG/ML IJ SOLN
0.5000 mg | INTRAMUSCULAR | Status: DC | PRN
  Administered 2017-07-06: 1 mg via INTRAVENOUS
  Filled 2017-07-06: qty 1

## 2017-07-06 MED ORDER — BUPIVACAINE HCL 0.5 % IJ SOLN
INTRAMUSCULAR | Status: DC | PRN
  Administered 2017-07-06: 5 mL

## 2017-07-06 MED ORDER — METHOCARBAMOL 500 MG PO TABS
500.0000 mg | ORAL_TABLET | Freq: Four times a day (QID) | ORAL | Status: DC | PRN
  Administered 2017-07-06 – 2017-07-07 (×3): 500 mg via ORAL
  Filled 2017-07-06 (×2): qty 1

## 2017-07-06 MED ORDER — OXYCODONE HCL 5 MG PO TABS
5.0000 mg | ORAL_TABLET | ORAL | Status: DC | PRN
  Administered 2017-07-06 – 2017-07-07 (×4): 10 mg via ORAL
  Filled 2017-07-06 (×4): qty 2

## 2017-07-06 MED ORDER — SODIUM CHLORIDE 0.9 % IV SOLN
INTRAVENOUS | Status: DC
  Administered 2017-07-06: 18:00:00 via INTRAVENOUS

## 2017-07-06 MED ORDER — MUPIROCIN 2 % EX OINT
TOPICAL_OINTMENT | CUTANEOUS | Status: AC
  Administered 2017-07-06: 1 via TOPICAL
  Filled 2017-07-06: qty 22

## 2017-07-06 MED ORDER — FLEET ENEMA 7-19 GM/118ML RE ENEM: 1.0000 | ENEMA | Freq: Once | RECTAL | Status: DC | PRN

## 2017-07-06 MED ORDER — HYDROMORPHONE HCL 1 MG/ML IJ SOLN: 2.0000 mg | INTRAMUSCULAR | Status: DC | PRN

## 2017-07-06 MED ORDER — HYDROCODONE-ACETAMINOPHEN 5-325 MG PO TABS
1.0000 | ORAL_TABLET | ORAL | Status: DC | PRN
  Administered 2017-07-06: 1 via ORAL
  Filled 2017-07-06: qty 1

## 2017-07-06 MED ORDER — DEXTROSE 5 % IV SOLN
INTRAVENOUS | Status: DC | PRN
  Administered 2017-07-06: 25 ug/min via INTRAVENOUS

## 2017-07-06 MED ORDER — ONDANSETRON HCL 4 MG/2ML IJ SOLN
INTRAMUSCULAR | Status: DC | PRN
  Administered 2017-07-06: 4 mg via INTRAVENOUS

## 2017-07-06 MED ORDER — CEFAZOLIN SODIUM-DEXTROSE 2-4 GM/100ML-% IV SOLN
2.0000 g | Freq: Three times a day (TID) | INTRAVENOUS | Status: AC
Start: 2017-07-06 — End: 2017-07-07
  Administered 2017-07-06 – 2017-07-07 (×2): 2 g via INTRAVENOUS
  Filled 2017-07-06 (×2): qty 100

## 2017-07-06 MED ORDER — FENTANYL CITRATE (PF) 250 MCG/5ML IJ SOLN
INTRAMUSCULAR | Status: DC | PRN
  Administered 2017-07-06: 50 ug via INTRAVENOUS
  Administered 2017-07-06: 200 ug via INTRAVENOUS

## 2017-07-06 MED ORDER — BISACODYL 10 MG RE SUPP: 10.0000 mg | Freq: Every day | RECTAL | Status: DC | PRN

## 2017-07-06 MED ORDER — SODIUM CHLORIDE 0.9% FLUSH: 3.0000 mL | INTRAVENOUS | Status: DC | PRN

## 2017-07-06 MED ORDER — ONDANSETRON HCL 4 MG PO TABS: 4.0000 mg | ORAL_TABLET | Freq: Four times a day (QID) | ORAL | Status: DC | PRN

## 2017-07-06 MED ORDER — CEFAZOLIN SODIUM-DEXTROSE 2-4 GM/100ML-% IV SOLN
INTRAVENOUS | Status: AC
  Filled 2017-07-06: qty 100

## 2017-07-06 MED ORDER — MEPERIDINE HCL 50 MG/ML IJ SOLN: 6.2500 mg | INTRAMUSCULAR | Status: DC | PRN

## 2017-07-06 SURGICAL SUPPLY — 66 items
ADH SKN CLS APL DERMABOND .7 (GAUZE/BANDAGES/DRESSINGS) ×1
BAG DECANTER FOR FLEXI CONT (MISCELLANEOUS) ×3 IMPLANT
BENZOIN TINCTURE PRP APPL 2/3 (GAUZE/BANDAGES/DRESSINGS) IMPLANT
BLADE CLIPPER SURG (BLADE) IMPLANT
BLADE SURG 11 STRL SS (BLADE) ×3 IMPLANT
BLADE ULTRA TIP 2M (BLADE) IMPLANT
BNDG GAUZE ELAST 4 BULKY (GAUZE/BANDAGES/DRESSINGS) IMPLANT
BUR MATCHSTICK NEURO 3.0 LAGG (BURR) ×3 IMPLANT
CAGE EXPANSE 8X6X6 (Cage) ×6 IMPLANT
CAGE PEEK 6X14X11 (Cage) ×6 IMPLANT
CANISTER SUCT 3000ML PPV (MISCELLANEOUS) ×3 IMPLANT
CARTRIDGE OIL MAESTRO DRILL (MISCELLANEOUS) ×1 IMPLANT
CLOSURE WOUND 1/2 X4 (GAUZE/BANDAGES/DRESSINGS)
DECANTER SPIKE VIAL GLASS SM (MISCELLANEOUS) ×6 IMPLANT
DERMABOND ADVANCED (GAUZE/BANDAGES/DRESSINGS) ×2
DERMABOND ADVANCED .7 DNX12 (GAUZE/BANDAGES/DRESSINGS) ×1 IMPLANT
DIFFUSER DRILL AIR PNEUMATIC (MISCELLANEOUS) ×3 IMPLANT
DRAIN CHANNEL 10M FLAT 3/4 FLT (DRAIN) IMPLANT
DRAPE C-ARM 42X72 X-RAY (DRAPES) ×6 IMPLANT
DRAPE HALF SHEET 40X57 (DRAPES) IMPLANT
DRAPE LAPAROTOMY 100X72 PEDS (DRAPES) ×3 IMPLANT
DRAPE MICROSCOPE LEICA (MISCELLANEOUS) ×3 IMPLANT
DRSG OPSITE 4X5.5 SM (GAUZE/BANDAGES/DRESSINGS) ×6 IMPLANT
DRSG OPSITE POSTOP 3X4 (GAUZE/BANDAGES/DRESSINGS) IMPLANT
DRSG OPSITE POSTOP 4X6 (GAUZE/BANDAGES/DRESSINGS) ×3 IMPLANT
DURAPREP 6ML APPLICATOR 50/CS (WOUND CARE) ×3 IMPLANT
ELECT COATED BLADE 2.86 ST (ELECTRODE) ×3 IMPLANT
ELECT REM PT RETURN 9FT ADLT (ELECTROSURGICAL) ×3
ELECTRODE REM PT RTRN 9FT ADLT (ELECTROSURGICAL) ×1 IMPLANT
EVACUATOR SILICONE 100CC (DRAIN) IMPLANT
GAUZE SPONGE 4X4 16PLY XRAY LF (GAUZE/BANDAGES/DRESSINGS) IMPLANT
GLOVE BIO SURGEON STRL SZ7.5 (GLOVE) IMPLANT
GLOVE BIOGEL PI IND STRL 7.5 (GLOVE) ×2 IMPLANT
GLOVE BIOGEL PI INDICATOR 7.5 (GLOVE) ×4
GLOVE ECLIPSE 7.0 STRL STRAW (GLOVE) ×3 IMPLANT
GLOVE EXAM NITRILE LRG STRL (GLOVE) IMPLANT
GLOVE EXAM NITRILE XL STR (GLOVE) IMPLANT
GLOVE EXAM NITRILE XS STR PU (GLOVE) IMPLANT
GOWN STRL REUS W/ TWL LRG LVL3 (GOWN DISPOSABLE) ×2 IMPLANT
GOWN STRL REUS W/ TWL XL LVL3 (GOWN DISPOSABLE) IMPLANT
GOWN STRL REUS W/TWL 2XL LVL3 (GOWN DISPOSABLE) IMPLANT
GOWN STRL REUS W/TWL LRG LVL3 (GOWN DISPOSABLE) ×4
GOWN STRL REUS W/TWL XL LVL3 (GOWN DISPOSABLE)
HEMOSTAT POWDER KIT SURGIFOAM (HEMOSTASIS) ×3 IMPLANT
KIT BASIN OR (CUSTOM PROCEDURE TRAY) ×3 IMPLANT
KIT TURNOVER KIT B (KITS) ×3 IMPLANT
NEEDLE HYPO 22GX1.5 SAFETY (NEEDLE) ×3 IMPLANT
NEEDLE SPNL 22GX3.5 QUINCKE BK (NEEDLE) ×3 IMPLANT
NS IRRIG 1000ML POUR BTL (IV SOLUTION) ×3 IMPLANT
OIL CARTRIDGE MAESTRO DRILL (MISCELLANEOUS) ×3
PACK LAMINECTOMY NEURO (CUSTOM PROCEDURE TRAY) ×3 IMPLANT
PAD ARMBOARD 7.5X6 YLW CONV (MISCELLANEOUS) ×9 IMPLANT
PLATE 2 40XLCK NS SPNE CVD (Plate) ×1 IMPLANT
PLATE 2 ATLANTIS TRANS (Plate) ×2 IMPLANT
RUBBERBAND STERILE (MISCELLANEOUS) ×6 IMPLANT
SCREW SELF TAP VAR 4.0X13 (Screw) ×18 IMPLANT
SPONGE INTESTINAL PEANUT (DISPOSABLE) ×3 IMPLANT
SPONGE SURGIFOAM ABS GEL 100 (HEMOSTASIS) ×3 IMPLANT
STRIP CLOSURE SKIN 1/2X4 (GAUZE/BANDAGES/DRESSINGS) IMPLANT
SUT ETHILON 3 0 FSL (SUTURE) IMPLANT
SUT VIC AB 3-0 SH 8-18 (SUTURE) ×3 IMPLANT
SUT VICRYL 3-0 RB1 18 ABS (SUTURE) ×9 IMPLANT
TAPE CLOTH 3X10 TAN LF (GAUZE/BANDAGES/DRESSINGS) ×3 IMPLANT
TOWEL GREEN STERILE (TOWEL DISPOSABLE) ×3 IMPLANT
TOWEL GREEN STERILE FF (TOWEL DISPOSABLE) ×3 IMPLANT
WATER STERILE IRR 1000ML POUR (IV SOLUTION) ×3 IMPLANT

## 2017-07-06 NOTE — Plan of Care (Signed)

## 2017-07-06 NOTE — Anesthesia Preprocedure Evaluation (Addendum)
Anesthesia Evaluation  Patient identified by MRN, date of birth, ID band Patient awake    Reviewed: Allergy & Precautions, NPO status , Patient's Chart, lab work & pertinent test results  Airway Mallampati: I  TM Distance: >3 FB Neck ROM: Full    Dental  (+) Poor Dentition, Missing, Chipped Teeth broken off at gum line:   Pulmonary Current Smoker,    Pulmonary exam normal breath sounds clear to auscultation       Cardiovascular negative cardio ROS Normal cardiovascular exam Rhythm:Regular Rate:Normal     Neuro/Psych PSYCHIATRIC DISORDERS Depression negative neurological ROS     GI/Hepatic negative GI ROS, (+)     substance abuse  , Polysubstance abuse Hx/o Anal condyloma    Endo/Other  negative endocrine ROS  Renal/GU Renal InsufficiencyRenal disease  negative genitourinary   Musculoskeletal  (+) Arthritis , Osteoarthritis,  Cervical stenosis C3-C5 with myelopathy   Abdominal   Peds  Hematology  (+) anemia , HIV,   Anesthesia Other Findings   Reproductive/Obstetrics                           Anesthesia Physical Anesthesia Plan  ASA: II  Anesthesia Plan: General   Post-op Pain Management:    Induction: Intravenous  PONV Risk Score and Plan: 3 and Midazolam, Dexamethasone, Ondansetron and Treatment may vary due to age or medical condition  Airway Management Planned: Oral ETT  Additional Equipment:   Intra-op Plan:   Post-operative Plan: Extubation in OR  Informed Consent: I have reviewed the patients History and Physical, chart, labs and discussed the procedure including the risks, benefits and alternatives for the proposed anesthesia with the patient or authorized representative who has indicated his/her understanding and acceptance.   Dental advisory given  Plan Discussed with: CRNA, Anesthesiologist and Surgeon  Anesthesia Plan Comments:         Anesthesia Quick  Evaluation

## 2017-07-06 NOTE — Op Note (Signed)
NEUROSURGERY OPERATIVE NOTE   PREOP DIAGNOSIS: Cervical Spondylosis with myelopathy, C3-4, C4-5  POSTOP DIAGNOSIS: Same  PROCEDURE: 1. Discectomy at C3-4, C4-5 for decompression of spinal cord and exiting nerve roots  2. Placement of intervertebral biomechanical device, Medtronic PTC PEEK 88m lordotic @ C3-4, C4-5 3. Placement of anterior instrumentation consisting of interbody plate and screws - Medtronic 47mAtlantis translational plate 4. Use of morselized bone allograft  5. Arthrodesis C3-4, C4-5, anterior interbody technique  6. Use of intraoperative microscope  SURGEON: Dr. NeConsuella LoseMD  ASSISTANT: Dr. HeCharlie PitterMD  ANESTHESIA: General Endotracheal  EBL: 25cc  SPECIMENS: None  DRAINS: None  COMPLICATIONS: None immediate  CONDITION: Hemodynamically stable to PACU  HISTORY: [name] a [age].o. man initially seen as an inpatient consultation resenting with about 5 days of left-sided arm and leg weakness.  His symptoms were relatively mild, he was able to walk with a cane.  His MRI demonstrated severe cervical stenosis with T2 signal change within the cord at C3-4, with moderately severe stenosis at C4-5.  Patient wished to be discharged and come back for elective ACDF.  We did discuss the treatment options including my recommendation for surgery.  Risks of the operation were discussed in detail.  After all questions were answered informed consent was obtained and witnessed.  PROCEDURE IN DETAIL: The patient was brought to the operating room and transferred to the operative table. After induction of general anesthesia, the patient was positioned on the operative table in the supine position with all pressure points meticulously padded. The skin of the neck was then prepped and draped in the usual sterile fashion.  After timeout was conducted, the skin was infiltrated with local anesthetic. Skin incision was then made sharply and Bovie electrocautery was  used to dissect the subcutaneous tissue until the platysma was identified. The platysma was then divided and undermined. The sternocleidomastoid muscle was then identified and, utilizing natural fascial planes in the neck, the prevertebral fascia was identified and the carotid sheath was retracted laterally and the trachea and esophagus retracted medially. Again using fluoroscopy, the correct disc spaces were identified. Bovie electrocautery was used to dissect in the subperiosteal plane and elevate the bilateral longus coli muscles. Table mounted retractors were then placed. At this point, the microscope was draped and brought into the field, and the remainder of the case was done under the microscope using microdissecting technique.  The C3-4 disc space was incised sharply and rongeurs were use to initially complete a discectomy. I did remove large anterior osteophytes especially on the left side. The high-speed drill was then used to complete discectomy until the posterior annulus was identified and removed and the posterior longitudinal ligament was identified. Again seen was a large posterior osteophyte arising primarily from the inferior portion of C3 and smaller osteophyte from C4. Using a nerve hook, the PLL was elevated, and Kerrison rongeurs were used to remove the posterior longitudinal ligament and the ventral thecal sac was identified. Using a combination of curettes and rongeurs, complete decompression of the thecal sac and exiting nerve roots at this level was completed by removal of spondylotic tissue and the large osteophytes. Decompression was verified using micro-nerve hook.  At this point, a 31m91mnterbody cage was sized and packed with morcellized bone allograft. This was then inserted and tapped into place.  Attention was then turned to the C4-5 level. In a similar fashion, discectomy was completed initially with curettes and rongeurs, and completed with the  drill. The PLL was again  identified, elevated and incised. Using Kerrison rongeurs, decompression of the spinal cord and exiting roots at the C4-5 level was completed and confirmed with a dissector.  A 30m interbody cage was then sized and filled with bone allograft, and tapped into place. Position of the interbody devices was then confirmed with fluoroscopy.  After placement of the intervertebral devices, the 491manterior cervical plate was selected, and placed across the interspaces. Using a high-speed drill, the cortex of the cervical vertebral bodies was punctured, and screws inserted in the C3, C4, C5 levels. Final fluoroscopic images in lateral projection were taken to confirm good hardware placement.  At this point, after all counts were verified to be correct, meticulous hemostasis was secured using a combination of bipolar electrocautery and passive hemostatics. The platysma muscle was then closed using interrupted 3-0 Vicryl sutures, and the skin was closed with a interrupted subcuticular stitch. Sterile dressings were then applied and the drapes removed.  The patient tolerated the procedure well and was extubated in the room and taken to the postanesthesia care unit in stable condition.

## 2017-07-06 NOTE — Progress Notes (Signed)
Orthopedic Tech Progress Note Patient Details:  [name] 06/01/1960 [id number]  Ortho Devices Type of Ortho Device: Soft collar Ortho Device/Splint Location: neck Ortho Device/Splint Interventions: Application   Post Interventions Patient Tolerated: Well Instructions Provided: Care of device   [name], [name] 07/06/2017, 4:40 PM

## 2017-07-06 NOTE — Transfer of Care (Signed)
Immediate Anesthesia Transfer of Care Note  Patient: [name]  Procedure(s) Performed: ANTERIOR CERVICAL DECOMPRESSION/DISCECTOMY FUSION C3-4/C4-5 2 LEVELS (N/A )  Patient Location: PACU  Anesthesia Type:General  Level of Consciousness: awake, alert  and oriented  Airway & Oxygen Therapy: Patient Spontanous Breathing  Post-op Assessment: Report given to RN, Post -op Vital signs reviewed and stable and Patient moving all extremities X 4  Post vital signs: Reviewed and stable  Last Vitals:  Vitals Value Taken Time  BP 161/100 07/06/2017  3:13 PM  Temp    Pulse 80 07/06/2017  3:13 PM  Resp 21 07/06/2017  3:13 PM  SpO2 99 % 07/06/2017  3:13 PM  Vitals shown include unvalidated device data.  Last Pain:  Vitals:   07/06/17 1057  TempSrc: Oral  PainSc: 2       Patients Stated Pain Goal: 3 (07/06/17 1057)  Complications: No apparent anesthesia complications

## 2017-07-06 NOTE — Anesthesia Procedure Notes (Signed)
Procedure Name: Intubation Date/Time: 07/06/2017 12:35 PM Performed by: Glynda Jaeger, CRNA Pre-anesthesia Checklist: Patient identified, Patient being monitored, Timeout performed, Emergency Drugs available and Suction available Patient Re-evaluated:Patient Re-evaluated prior to induction Oxygen Delivery Method: Circle System Utilized Preoxygenation: Pre-oxygenation with 100% oxygen Induction Type: IV induction Ventilation: Mask ventilation without difficulty Laryngoscope Size: Mac, 3 and 4 Grade View: Grade III Tube type: Oral Tube size: 7.5 mm Number of attempts: 1 Airway Equipment and Method: Stylet Placement Confirmation: ETT inserted through vocal cords under direct vision,  positive ETCO2 and breath sounds checked- equal and bilateral Secured at: 22 cm Tube secured with: Tape Dental Injury: Teeth and Oropharynx as per pre-operative assessment

## 2017-07-06 NOTE — Anesthesia Postprocedure Evaluation (Signed)
Anesthesia Post Note  Patient: [name]  Procedure(s) Performed: ANTERIOR CERVICAL DECOMPRESSION/DISCECTOMY FUSION C3-4/C4-5 2 LEVELS (N/A )     Patient location during evaluation: PACU Anesthesia Type: General Level of consciousness: awake and alert Pain management: pain level controlled Vital Signs Assessment: post-procedure vital signs reviewed and stable Respiratory status: spontaneous breathing, nonlabored ventilation, respiratory function stable and patient connected to nasal cannula oxygen Cardiovascular status: blood pressure returned to baseline and stable Postop Assessment: no apparent nausea or vomiting Anesthetic complications: no    Last Vitals:  Vitals:   07/06/17 1600 07/06/17 1615  BP: (!) 168/104 (!) 156/89  Pulse: 90 92  Resp: 20 20  Temp:    SpO2: 96% 97%    Last Pain:  Vitals:   07/06/17 1625  TempSrc:   PainSc: 9     LLE Motor Response: Purposeful movement;Responds to commands (07/06/17 1615) LLE Sensation: No numbness;No tingling (07/06/17 1615) RLE Motor Response: Purposeful movement;Responds to commands (07/06/17 1615) RLE Sensation: No numbness;No tingling (07/06/17 1615)      Rayah Fines A.

## 2017-07-06 NOTE — H&P (Signed)
Chief Complaint   Left sided weakness  HPI   HPI: [name] is a 57 y.o. male who presented to ER on 06/27/2017 due to leftValentino Nose sided numbness/tingling and weakness. Symptoms started abruptly about 5 days prior to presentation to ER. Work up included MRI C Spine which was significant for multilevel cervical degenerative changes most prominent at C3-4 and C4-5. At C3-4 there is a severe spinal stenosis due to severe disc osteophyte complex. This causes cord impingement. There is also cord signal abnormality. At C4-5 there is moderate spinal stenosis with cord flattening. No cord changes at this level. It was recommended that he undergo surgical decompression and fusion by ACDF in order to prevent worsening and provide meaningful chance for recovery. He presents today for surgery. He is without any concerns.   Patient Active Problem List   Diagnosis Date Noted  . Left-sided weakness 06/27/2017  . Tricompartment osteoarthritis of right knee 03/27/2017  . Alcohol abuse with alcohol-induced mood disorder (HCC) 09/25/2016  . Polysubstance dependence (HCC) 09/25/2016  . Substance use disorder 09/19/2016  . 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 HIV disease (HCC) 11/09/2008    PMH: Past Medical History:  Diagnosis Date  . 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 Hemorrhoids   . HIV (human immunodeficiency virus infection) (HCC)   . Stenosis of cervical spine    withb myelopathy    PSH: Past Surgical History:  Procedure Laterality Date  . HERNIA REPAIR  97   lt ing  . RECTAL EXAM UNDER ANESTHESIA N/A 04/24/2014   Procedure:  ligation of bleeding vessels;  Surgeon: Almond LintFaera Byerly, MD;  Location: [facility];  Service: General;  Laterality: N/A;  . TENDON REPAIR     2006-lt index  . WART FULGURATION  07/06/2011   Procedure: FULGURATION ANAL WART;  Surgeon: Shelly Rubensteinouglas A Blackman, MD;  Location: [facility];  Service:  General;  Laterality: N/A;  excision anal condyloma  . WART FULGURATION N/A 04/23/2014   Procedure: EXCISION OF ANAL CONDYLOMA;  Surgeon: Abigail Miyamotoouglas Blackman, MD;  Location: [facility];  Service: General;  Laterality: N/A;    No medications prior to admission.    SH: Social History   Tobacco Use  . Smoking status: Current Every Day Smoker    Packs/day: 0.50    Years: 25.00    Pack years: 12.50    Types: Cigarettes  . Smokeless tobacco: Never Used  Substance Use Topics  . Alcohol use: Yes    Alcohol/week: 4.2 - 4.8 oz    Types: 7 - 8 Cans of beer per week    Comment: a case every 3 to 4 days  . Drug use: No    MEDS: Prior to Admission medications   Medication Sig Start Date End Date Taking? Authorizing Provider  acetaminophen (TYLENOL) 325 MG tablet Take 2 tablets (650 mg total) by mouth every 6 (six) hours as needed for mild pain (or Fever >/= 101). 06/29/17   Elgergawy, Leana Roeawood S, MD  buPROPion (WELLBUTRIN XL) 300 MG 24 hr tablet Take 1 tablet (300 mg total) by mouth every morning. For depression 09/21/16   Armandina StammerNwoko, Agnes I, NP  darunavir-cobicistat (PREZCOBIX) 800-150 MG tablet Take 1 tablet by mouth daily. Swallow whole. Do NOT crush, break or chew tablets. Take with food, take with Descovy. 03/27/17   Veryl Speakalone, Gregory D, FNP  diphenhydrAMINE (BENADRYL) 50 MG tablet Take 100 mg by mouth at bedtime as needed for sleep.  [provider]  emtricitabine-tenofovir AF (DESCOVY) 200-25 MG tablet Take 1 tablet by mouth daily. Take with Prezcobix. 03/27/17   Veryl Speak, FNP  folic acid (FOLVITE) 1 MG tablet Take 1 tablet (1 mg total) by mouth daily. 06/30/17   Elgergawy, Leana Roe, MD  ibuprofen (ADVIL,MOTRIN) 800 MG tablet Take 800 mg by mouth every 8 (eight) hours as needed.    [provider]  thiamine 100 MG tablet Take 1 tablet (100 mg total) by mouth daily. 06/30/17   Elgergawy, Leana Roe, MD    ALLERGY: Allergies  Allergen Reactions  . Shellfish Allergy Anaphylaxis  and Swelling    Swelling everywhere  . Sulfa Antibiotics Anaphylaxis, Hives, Itching and Swelling    Swelling all over     Social History   Tobacco Use  . Smoking status: Current Every Day Smoker    Packs/day: 0.50    Years: 25.00    Pack years: 12.50    Types: Cigarettes  . Smokeless tobacco: Never Used  Substance Use Topics  . Alcohol use: Yes    Alcohol/week: 4.2 - 4.8 oz    Types: 7 - 8 Cans of beer per week    Comment: a case every 3 to 4 days     Family History  Problem Relation Age of Onset  . Cancer Mother        brain  . Cancer Sister        breast     ROS   ROS  Exam   There were no vitals filed for this visit. General appearance: WDWN, NAD Eyes: PERRL, Fundoscopic: normal Cardiovascular: Regular rate and rhythm without murmurs, rubs, gallops. No edema or variciosities. Distal pulses normal. Pulmonary: Clear to auscultation Musculoskeletal:     Gait: ambulates with cane favoring right leg (chronic right knee pain attributed to OA)    Muscle tone upper extremities: Normal    Muscle tone lower extremities: Normal    Motor exam:        - RUE/RLE: 5/5       - LUE/LLE: mild weakness, 4+/5 diffusely Neurological Awake, alert, oriented Memory and concentration grossly intact Speech fluent, appropriate CNII: Visual fields normal CNIII/IV/VI: EOMI CNV: Facial sensation normal CNVII: Symmetric, normal strength CNVIII: Grossly normal CNIX: Normal palate movement CNXI: Trap and SCM strength normal CN XII: Tongue protrusion normal Sensation grossly intact to LT DTR: Normal Coordination (finger/nose & heel/shin): Normal  Results - Imaging/Labs   No results found for this or any previous visit (from the past 48 hour(s)).  No results found.   Impression/Plan   57 y.o. male with mild left hemiparesis related to severe stenosis with evidence of spinal cord signal change at C3-4 and C4-5. Recommendation is for surgical decompression and fusion by ACDF  in order to prevent worsening and provide meaningful chance for recovery. The risks of surgery were reviewed with the patient in detail including risk of spinal cord/nerve injury resulting in arm/leg/bowel/bladder dysfunction possibly numbness, tingling, weakness, or paralysis, dysphagia, bleeding, stroke, infection, and possible need for reoperation. General risks of anesthesia were also reviewed including heart attack, stroke, and blood clot DVT/PE. All his questions were answered and he indicated willingness to proceed with surgery. Consent signed.

## 2017-07-07 LAB — CBC
HCT: 37 % — ABNORMAL LOW (ref 39.0–52.0)
Hemoglobin: 11.6 g/dL — ABNORMAL LOW (ref 13.0–17.0)
MCH: 27.3 pg (ref 26.0–34.0)
MCHC: 31.4 g/dL (ref 30.0–36.0)
MCV: 87.1 fL (ref 78.0–100.0)
Platelets: 263 10*3/uL (ref 150–400)
RBC: 4.25 MIL/uL (ref 4.22–5.81)
RDW: 13.9 % (ref 11.5–15.5)
WBC: 10.2 10*3/uL (ref 4.0–10.5)

## 2017-07-07 LAB — BASIC METABOLIC PANEL
Anion gap: 7 (ref 5–15)
BUN: 10 mg/dL (ref 6–20)
CO2: 26 mmol/L (ref 22–32)
Calcium: 8.8 mg/dL — ABNORMAL LOW (ref 8.9–10.3)
Chloride: 101 mmol/L (ref 98–111)
Creatinine, Ser: 0.96 mg/dL (ref 0.61–1.24)
GFR calc Af Amer: >60 mL/min (ref >60)
GFR calc non Af Amer: >60 mL/min (ref >60)
Glucose, Bld: 112 mg/dL — ABNORMAL HIGH (ref 70–99)
Potassium: 4.2 mmol/L (ref 3.5–5.1)
Sodium: 134 mmol/L — ABNORMAL LOW (ref 135–145)

## 2017-07-07 LAB — PROTIME-INR
INR: 1.02
Prothrombin Time: 13.4 s (ref 11.4–15.2)

## 2017-07-07 LAB — APTT: aPTT: 29 s (ref 24–36)

## 2017-07-07 MED ORDER — OXYCODONE HCL 5 MG PO TABS: 5.0000 mg | ORAL_TABLET | ORAL | 0 refills | Status: DC | PRN

## 2017-07-07 NOTE — Progress Notes (Signed)
Patient is discharged from room 3C02 at this time. Alert and in stable condition. IV site d/c'd and instructions read to patient with understanding verbalized. Left unit via wheelchair with all belongings at side. 

## 2017-07-07 NOTE — Discharge Instructions (Signed)
Wound Care Leave incision open to air. You may shower. Do not scrub directly on incision.  Do not put any creams, lotions, or ointments on incision. Activity Walk each and every day, increasing distance each day. No lifting greater than 5 lbs.  Avoid excessive neck motion. No driving for 2 weeks; may ride as a passenger locally. Wear neck brace at all times except when showering.  If provided soft collar, may wear for comfort unless otherwise instructed. Diet Resume your normal diet.  Return to Work Will be discussed at you follow up appointment. Call Your Doctor If Any of These Occur Redness, drainage, or swelling at the wound.  Temperature greater than 101 degrees. Severe pain not relieved by pain medication. Increased difficulty swallowing. Incision starts to come apart. Follow Up Appt Call today for appointment in 2-3 weeks ([phone]) or for problems.  If you have any hardware placed in your spine, you will need an x-ray before your appointment.  

## 2017-07-07 NOTE — Evaluation (Signed)
Physical Therapy Evaluation Patient Details Name: [name] MRN: [id number] DOB: 1960-03-16 Today's Date: 07/07/2017   History of Present Illness  Pt is a 57 y/o male who presented to the ED on 06/27/17 with L sided weakness, falls. 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 He is now s/p C3-C5 ACDF ON 07/06/17. Hx of HIV, OA, ETOH abuse, polysubstance abuse.   Clinical Impression  Pt admitted with above diagnosis. Pt currently with functional limitations due to the deficits listed below (see PT Problem List). At the time of PT eval pt was able to perform transfers and ambulation with gross min guard assist to supervision for safety. Pt has been mobilizing with a SPC PTA however feel he is much safer with the RW. As pt lives alone, HHPT would be beneficial to maximize functional independence and safety within the home environment. Acutely, pt will benefit from skilled PT to increase their independence and safety with mobility to allow discharge to the venue listed below.       Follow Up Recommendations Home health PT;Supervision for mobility/OOB    Equipment Recommendations  Rolling walker with 5" wheels    Recommendations for Other Services       Precautions / Restrictions Precautions Precautions: Fall;Cervical Precaution Booklet Issued: Yes (comment) Precaution Comments: Reviewed handout in detail although needs further reinforcement for precautions Restrictions Weight Bearing Restrictions: No      Mobility  Bed Mobility Overal bed mobility: Needs Assistance Bed Mobility: Rolling;Sidelying to Sit;Sit to Sidelying Rolling: Modified independent (Device/Increase time) Sidelying to sit: Supervision     Sit to sidelying: Supervision General bed mobility comments: Attempted bed mobility x4 for log roll technique however unable to properly perform. Feel pt is physically able to, but is having difficulty sequencing at this time.   Transfers Overall  transfer level: Needs assistance Equipment used: Rolling walker (2 wheeled) Transfers: Sit to/from Stand Sit to Stand: Min guard         General transfer comment: VC's for hand placement on seated surface for safety. No assist required however close guard provided for safety.   Ambulation/Gait Ambulation/Gait assistance: Min guard Gait Distance (Feet): 350 Feet Assistive device: Rolling walker (2 wheeled) Gait Pattern/deviations: Decreased dorsiflexion - right;Drifts right/left;Step-through pattern Gait velocity: Decreased Gait velocity interpretation: <1.31 ft/sec, indicative of household ambulator General Gait Details: Hands-on guarding for safety. Pt was able to improve posture with VC's however unable to maintain. Pt appears unsteady even with BUE support but no physical assist provided.   Stairs Stairs: Yes Stairs assistance: Min guard Stair Management: Two rails;Step to pattern;Forwards Number of Stairs: 5 General stair comments: Attempted leading with RLE - pt circumducting to advance RLE to next step. Feel pt may be safer leading with LLE however pt declined stating he does "not want to push that leg too much"  Wheelchair Mobility    Modified Rankin (Stroke Patients Only)       Balance Overall balance assessment: Needs assistance Sitting-balance support: No upper extremity supported;Feet supported Sitting balance-Leahy Scale: Fair     Standing balance support: No upper extremity supported;During functional activity Standing balance-Leahy Scale: Poor Standing balance comment: Reaching for outside support without the walker.                              Pertinent Vitals/Pain Pain Assessment: 0-10 Pain Score: 7  Pain Location: neck, shoulders Pain Descriptors / Indicators: Operative site guarding;Discomfort Pain Intervention(s): Limited  activity within patient's tolerance;Monitored during session;Repositioned    Home Living Family/patient expects  to be discharged to:: Private residence Living Arrangements: Alone Available Help at Discharge: Family;Available PRN/intermittently Type of Home: Apartment Home Access: Stairs to enter Entrance Stairs-Rails: Right Entrance Stairs-Number of Steps: 5 Home Layout: One level Home Equipment: Cane - single point Additional Comments: borrowed cane    Prior Function Level of Independence: Independent with assistive device(s)         Comments: SPC     Hand Dominance   Dominant Hand: Right    Extremity/Trunk Assessment   Upper Extremity Assessment Upper Extremity Assessment: Defer to OT evaluation    Lower Extremity Assessment Lower Extremity Assessment: RLE deficits/detail;LLE deficits/detail RLE Deficits / Details: Noted decreased strength and difficulty with knee flexion especially on stairs. Pt reports OA in R knee.  LLE Deficits / Details: Decreased strength consistent with pre-op diagnosis. Pt reports LLE feels much better since surgery but does not want to stress it too much at this time.    Cervical / Trunk Assessment Cervical / Trunk Assessment: Other exceptions Cervical / Trunk Exceptions: s/p surgery  Communication   Communication: No difficulties  Cognition Arousal/Alertness: Awake/alert Behavior During Therapy: WFL for tasks assessed/performed Overall Cognitive Status: Impaired/Different from baseline Area of Impairment: Memory;Safety/judgement;Awareness;Problem solving                     Memory: Decreased recall of precautions   Safety/Judgement: Decreased awareness of safety;Decreased awareness of deficits Awareness: Emergent Problem Solving: Slow processing;Decreased initiation;Requires verbal cues;Difficulty sequencing General Comments: At times, required commands to be given in several different ways before pt was able to understand what the therapist was asking him to do. Poor recall of precautions throughout session.       General Comments       Exercises     Assessment/Plan    PT Assessment Patient needs continued PT services  PT Problem List Decreased balance;Decreased mobility;Decreased strength;Decreased knowledge of use of DME;Decreased activity tolerance;Decreased cognition;Decreased safety awareness;Decreased knowledge of precautions;Pain       PT Treatment Interventions DME instruction;Gait training;Stair training;Functional mobility training;Therapeutic activities;Therapeutic exercise;Neuromuscular re-education;Patient/family education    PT Goals (Current goals can be found in the Care Plan section)  Acute Rehab PT Goals PT Goal Formulation: With patient Time For Goal Achievement: 07/15/17 Potential to Achieve Goals: Good    Frequency Min 5X/week   Barriers to discharge Decreased caregiver support Sister available PRN    Co-evaluation               AM-PAC PT "6 Clicks" Daily Activity  Outcome Measure Difficulty turning over in bed (including adjusting bedclothes, sheets and blankets)?: None Difficulty moving from lying on back to sitting on the side of the bed? : A Little Difficulty sitting down on and standing up from a chair with arms (e.g., wheelchair, bedside commode, etc,.)?: A Little Help needed moving to and from a bed to chair (including a wheelchair)?: A Little Help needed walking in hospital room?: A Little Help needed climbing 3-5 steps with a railing? : A Little 6 Click Score: 19    End of Session Equipment Utilized During Treatment: Gait belt Activity Tolerance: Patient tolerated treatment well Patient left: in bed;with call bell/phone within reach Nurse Communication: Mobility status PT Visit Diagnosis: Other abnormalities of gait and mobility (R26.89);Unsteadiness on feet (R26.81);Muscle weakness (generalized) (M62.81);History of falling (Z91.81)    Time: [id number] PT Time Calculation (min) (ACUTE ONLY): 23 min   Charges:  PT Evaluation $PT Eval Moderate Complexity: 1  Mod PT Treatments $Gait Training: 8-22 mins   PT G Codes:        Conni Slipper, PT, DPT Acute Rehabilitation Services Pager: [phone]   Marylynn Pearson 07/07/2017, 9:34 AM

## 2017-07-07 NOTE — Evaluation (Signed)
Occupational Therapy Evaluation Patient Details Name: [name] MRN: [id number] DOB: 07-Apr-1960 Today's Date: 07/07/2017    History of Present Illness Pt is a 57 y/o male who presented to the ED on 06/27/17 with L sided weakness, falls. 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 He is now s/p C3-C5 ACDF ON 07/06/17. Hx of HIV, OA, ETOH abuse, polysubstance abuse.    Clinical Impression   Pt admitted with the above, and demonstrates the below listed deficits.  He demonstrates poor awareness of precautions and safety concerns.   He is able to perform ADLs with min guard assist, and reacher was provided, however, unsure he will be able to carry over precautions into his home environment.  Recommend he use a seat in the shower as he is at risk for falls.  He lives alone, and will have intermittent supervision/assist from sister, however, he really would benefit from 24 hour supervision, which he states he does not have available. Recommend HHOT.     Follow Up Recommendations  Home health OT;Supervision/Assistance - 24 hour(Recommend 24 hour supervision, but pt does not have this )    Equipment Recommendations  None recommended by OT    Recommendations for Other Services       Precautions / Restrictions Precautions Precautions: Fall;Cervical Precaution Booklet Issued: Yes (comment) Precaution Comments: Pt unable to recall precautions that were discussed with him during PT.  Reviewed cervical precautions and reinforced safety  Required Braces or Orthoses: Other Brace/Splint(soft collar ) Restrictions Weight Bearing Restrictions: No      Mobility Bed Mobility Overal bed mobility: Needs Assistance Bed Mobility: Rolling;Sidelying to Sit;Sit to Sidelying Rolling: Modified independent (Device/Increase time) Sidelying to sit: Supervision     Sit to sidelying: Supervision General bed mobility comments: Pt sitting EOB, and he deferred practice    Transfers Overall transfer level: Needs assistance Equipment used: Rolling walker (2 wheeled) Transfers: Sit to/from Stand Sit to Stand: Min guard         General transfer comment: min guard for safety     Balance Overall balance assessment: Needs assistance Sitting-balance support: No upper extremity supported;Feet supported Sitting balance-Leahy Scale: Good     Standing balance support: No upper extremity supported;During functional activity Standing balance-Leahy Scale: Poor Standing balance comment: requires UE support                            ADL either performed or assessed with clinical judgement   ADL Overall ADL's : Needs assistance/impaired Eating/Feeding: Supervision/ safety;Sitting   Grooming: Wash/dry hands;Wash/dry face;Oral care;Brushing hair;Min guard;Standing Grooming Details (indicate cue type and reason): Pt instructed to avoid bending during grooming and to bring items to him face; use cups to spit into when brushing teeth  Upper Body Bathing: Supervision/ safety;Sitting   Lower Body Bathing: Sit to/from stand;Min guard Lower Body Bathing Details (indicate cue type and reason): Pt instructed in use of LH sponge for LB ADLs. and instructed pt to sit to shower  Upper Body Dressing : Supervision/safety;Sitting Upper Body Dressing Details (indicate cue type and reason): Strongly cautioned pt to avoid neck movement when donning/doffing shirt.  He was able to perform with direct supervision  Lower Body Dressing: Min guard;Sit to/from stand Lower Body Dressing Details (indicate cue type and reason): Pt instructed how to perform LB ADLs without bending forward, then proceeded to bend forward despite being instructed not to  Toilet Transfer: Min guard;Ambulation;Comfort height toilet;Grab bars;RW  Toileting- ArchitectClothing Manipulation and Hygiene: Min guard;Sit to/from stand   Tub/ Shower Transfer: Tub transfer;Min guard;Ambulation;Shower Personal assistantseat;Rolling  walker Tub/Shower Transfer Details (indicate cue type and reason): discussed options for tub seat with pt.  He reports he has a plastic outside chair and rubber mat he can use  Functional mobility during ADLs: Min guard;Rolling walker General ADL Comments: reviewed safety with IADLs, and reinforced back precautions.  Pt provided with reacher.  Also discussed safe methods for feeding dogs - use reacher to retrieve food bowls and fill them at the counter.  He was unable to recall this instruction provided by PT earlier      Vision         Perception     Praxis      Pertinent Vitals/Pain Pain Assessment: 0-10 Pain Score: 7  Pain Location: neck, shoulders Pain Descriptors / Indicators: Operative site guarding;Discomfort Pain Intervention(s): Monitored during session     Hand Dominance Right   Extremity/Trunk Assessment Upper Extremity Assessment Upper Extremity Assessment: LUE deficits/detail LUE Deficits / Details: Pt indicates mild "numbness"  Lt UE that is much better.  No functional deficits noted    Lower Extremity Assessment Lower Extremity Assessment: Defer to PT evaluation RLE Deficits / Details: Noted decreased strength and difficulty with knee flexion especially on stairs. Pt reports OA in R knee.  LLE Deficits / Details: Decreased strength consistent with pre-op diagnosis. Pt reports LLE feels much better since surgery but does not want to stress it too much at this time.   Cervical / Trunk Assessment Cervical / Trunk Assessment: Other exceptions Cervical / Trunk Exceptions: s/p surgery   Communication Communication Communication: No difficulties   Cognition Arousal/Alertness: Awake/alert Behavior During Therapy: WFL for tasks assessed/performed Overall Cognitive Status: No family/caregiver present to determine baseline cognitive functioning Area of Impairment: Memory;Safety/judgement;Awareness;Problem solving;Attention                   Current Attention  Level: Selective(frequently self distracts ) Memory: Decreased recall of precautions;Decreased short-term memory   Safety/Judgement: Decreased awareness of safety;Decreased awareness of deficits Awareness: Emergent Problem Solving: Slow processing;Requires verbal cues;Difficulty sequencing General Comments: Pt frequently self distracts, and pays poor attention to therapist and instructions.  He requires instructions to be repeated multiple times, and then proceeds to do what he has been told to avoid    General Comments  Pt instructed how to don/doff cervical collar as well as how to clean collar     Exercises     Shoulder Instructions      Home Living Family/patient expects to be discharged to:: Private residence Living Arrangements: Alone Available Help at Discharge: Family;Available PRN/intermittently Type of Home: Apartment Home Access: Stairs to enter Entrance Stairs-Number of Steps: 5 Entrance Stairs-Rails: Right Home Layout: One level     Bathroom Shower/Tub: Chief Strategy OfficerTub/shower unit   Bathroom Toilet: Standard Bathroom Accessibility: Yes   Home Equipment: Cane - single point   Additional Comments: borrowed cane.  Has two small dogs       Prior Functioning/Environment Level of Independence: Independent with assistive device(s)        Comments: SPC        OT Problem List: Decreased strength;Decreased activity tolerance;Impaired balance (sitting and/or standing);Decreased cognition;Decreased safety awareness;Decreased knowledge of use of DME or AE;Decreased knowledge of precautions;Pain      OT Treatment/Interventions:      OT Goals(Current goals can be found in the care plan section) Acute Rehab OT Goals Patient Stated Goal: to go home  and get better  OT Goal Formulation: All assessment and education complete, DC therapy  OT Frequency:     Barriers to D/C:            Co-evaluation              AM-PAC PT "6 Clicks" Daily Activity     Outcome Measure  Help from another person eating meals?: None Help from another person taking care of personal grooming?: A Little Help from another person toileting, which includes using toliet, bedpan, or urinal?: A Little Help from another person bathing (including washing, rinsing, drying)?: A Little Help from another person to put on and taking off regular upper body clothing?: A Little Help from another person to put on and taking off regular lower body clothing?: A Little 6 Click Score: 19   End of Session Equipment Utilized During Treatment: Cervical collar Nurse Communication: Mobility status;Precautions  Activity Tolerance: Patient tolerated treatment well Patient left: in bed;with call bell/phone within reach(Pt sitting EOB )  OT Visit Diagnosis: Unsteadiness on feet (R26.81);Muscle weakness (generalized) (M62.81);Pain Pain - part of body: (neck )                Time: 0272-5366 OT Time Calculation (min): 22 min Charges:  OT General Charges $OT Visit: 1 Visit OT Evaluation $OT Eval Moderate Complexity: 1 Mod G-Codes:     [employer], OTR/L [phone]   Jeani Hawking M 07/07/2017, 11:56 AM

## 2017-07-07 NOTE — Care Management Note (Signed)
Case Management Note  Patient Details  Name: [name] MRN: [id number] Date of Birth: 15-Oct-1960  Subjective/Objective:        Pt to d/c home with home therapy.  [facility]            Action/Plan: Jerm[facility] with [facility] accepted referral.  [facility]  Expected Discharge Date:  07/07/17               Expected Discharge Plan:  Home w Home Health Services  In-House Referral:  NA  Discharge planning Services  CM Consult  Post Acute Care Choice:  Durable Medical Equipment, Home Health Choice offered to:  Patient  DME Arranged:  Walker rolling DME Agency:  [facility]  [facility]  Status of Service:  Completed, signed off  If discussed at Long Length of Stay Meetings, dates discussed:    Additional Comments:  Deveron Furlongshley  Gunter Conde, RN 07/07/2017, 10:29 AM

## 2017-07-07 NOTE — Discharge Summary (Addendum)
  Physician Discharge Summary  Patient ID: [name] MRN: [id number] DOB/AGE: [birth year] 57 y.o. Estimated body mass index is 23.33 kg/m as calculated from the following:   Height as of this encounter: 6' (1.829 m).   Weight as of this encounter: 78 kg (172 lb).   Admit date: 07/06/2017 Discharge date: 07/07/2017  Admission Diagnoses:cervical spondylosis  Discharge Diagnoses: same Active Problems:   Stenosis of cervical spine with myelopathy [facility](HCC)   Discharged Condition: good  Hospital Course: patient admitted hospital underwent ACDF. Postoperative patient did very well recovered before was emanating voiding spontaneously tolerating regular diet although did have some soreness in his throat. Neurologically was improved arms felt better. Patient be discharged scheduled follow-up in one to 2 weeks  Consults: Significant Diagnostic Studies: Treatments:ACDF C3-4 C4-5 Discharge Exam: Blood pressure 132/86, pulse 70, temperature 98.6 F (37 C), temperature source Oral, resp. rate 20, height 6' (1.829 m), weight 78 kg (172 lb), SpO2 99 %. Strength 5 out of 5 wound clean dry and intact  Disposition: home   Allergies as of 07/07/2017      Reactions   Shellfish Allergy Anaphylaxis, Swelling   Swelling everywhere   Sulfa Antibiotics Anaphylaxis, Hives, Itching, Swelling   Swelling all over       Medication List    TAKE these medications   acetaminophen 325 MG tablet Commonly known as:  TYLENOL Take 2 tablets (650 mg total) by mouth every 6 (six) hours as needed for mild pain (or Fever >/= 101).   buPROPion 300 MG 24 hr tablet Commonly known as:  WELLBUTRIN XL Take 1 tablet (300 mg total) by mouth every morning. For depression   darunavir-cobicistat 800-150 MG tablet Commonly known as:  PREZCOBIX Take 1 tablet by mouth daily. Swallow whole. Do NOT crush, break or chew tablets. Take with food, take with Descovy.   diphenhydrAMINE 50 MG tablet Commonly known as:   BENADRYL Take 100 mg by mouth at bedtime as needed for sleep.   emtricitabine-tenofovir AF 200-25 MG tablet Commonly known as:  DESCOVY Take 1 tablet by mouth daily. Take with Prezcobix.   folic acid 1 MG tablet Commonly known as:  FOLVITE Take 1 tablet (1 mg total) by mouth daily.   ibuprofen 800 MG tablet Commonly known as:  ADVIL,MOTRIN Take 800 mg by mouth every 8 (eight) hours as needed.   oxyCODONE 5 MG immediate release tablet Commonly known as:  Oxy IR/ROXICODONE Take 1-2 tablets (5-10 mg total) by mouth every 3 (three) hours as needed for severe pain ((score 7 to 10)).   thiamine 100 MG tablet Take 1 tablet (100 mg total) by mouth daily.      Patient seen and examined.  Agree with plan for home therapy.  Signed: CRAM,GARY P 07/07/2017, 7:27 AM

## 2017-07-08 ENCOUNTER — Inpatient Hospital Stay (HOSPITAL_COMMUNITY)
Admission: EM | Admit: 2017-07-08 | Discharge: 2017-07-10 | DRG: 394 | Disposition: A | Discharge status: Home / Self Care | Payer: Medicare Other | Attending: Neurosurgery | Admitting: Neurosurgery

## 2017-07-08 ENCOUNTER — Encounter (HOSPITAL_COMMUNITY): Payer: Self-pay

## 2017-07-08 DIAGNOSIS — Z882 Allergy status to sulfonamides status: Secondary | ICD-10-CM

## 2017-07-08 DIAGNOSIS — K9189 Other postprocedural complications and disorders of digestive system: Secondary | ICD-10-CM | POA: Diagnosis not present

## 2017-07-08 DIAGNOSIS — G8918 Other acute postprocedural pain: Secondary | ICD-10-CM

## 2017-07-08 DIAGNOSIS — Y838 Other surgical procedures as the cause of abnormal reaction of the patient, or of later complication, without mention of misadventure at the time of the procedure: Secondary | ICD-10-CM | POA: Diagnosis present

## 2017-07-08 DIAGNOSIS — F17210 Nicotine dependence, cigarettes, uncomplicated: Secondary | ICD-10-CM | POA: Diagnosis present

## 2017-07-08 DIAGNOSIS — R1310 Dysphagia, unspecified: Secondary | ICD-10-CM | POA: Diagnosis not present

## 2017-07-08 DIAGNOSIS — Z91013 Allergy to seafood: Secondary | ICD-10-CM

## 2017-07-08 DIAGNOSIS — B20 Human immunodeficiency virus [HIV] disease: Secondary | ICD-10-CM | POA: Diagnosis present

## 2017-07-08 LAB — CBC
HCT: 34.4 % — ABNORMAL LOW (ref 39.0–52.0)
Hemoglobin: 10.6 g/dL — ABNORMAL LOW (ref 13.0–17.0)
MCH: 27.1 pg (ref 26.0–34.0)
MCHC: 30.8 g/dL (ref 30.0–36.0)
MCV: 88 fL (ref 78.0–100.0)
Platelets: 245 K/uL (ref 150–400)
RBC: 3.91 MIL/uL — ABNORMAL LOW (ref 4.22–5.81)
RDW: 14.1 % (ref 11.5–15.5)
WBC: 10.5 K/uL (ref 4.0–10.5)

## 2017-07-08 LAB — BASIC METABOLIC PANEL
ANION GAP: 7 (ref 5–15)
BUN: 11 mg/dL (ref 6–20)
CHLORIDE: 103 mmol/L (ref 98–111)
CO2: 27 mmol/L (ref 22–32)
Calcium: 8.9 mg/dL (ref 8.9–10.3)
Creatinine, Ser: 0.98 mg/dL (ref 0.61–1.24)
Glucose, Bld: 97 mg/dL (ref 70–99)
POTASSIUM: 3.9 mmol/L (ref 3.5–5.1)
SODIUM: 137 mmol/L (ref 135–145)

## 2017-07-08 LAB — PROTIME-INR
INR: 0.97
Prothrombin Time: 12.8 s (ref 11.4–15.2)

## 2017-07-08 MED ORDER — EMTRICITABINE-TENOFOVIR AF 200-25 MG PO TABS
1.0000 | ORAL_TABLET | Freq: Every day | ORAL | Status: DC
  Administered 2017-07-09 – 2017-07-10 (×2): 1 via ORAL
  Filled 2017-07-08 (×3): qty 1

## 2017-07-08 MED ORDER — MORPHINE SULFATE (PF) 4 MG/ML IV SOLN
4.0000 mg | Freq: Once | INTRAVENOUS | Status: AC
Start: 2017-07-08 — End: 2017-07-08
  Administered 2017-07-08: 4 mg via INTRAVENOUS
  Filled 2017-07-08: qty 1

## 2017-07-08 MED ORDER — VITAMIN B-1 100 MG PO TABS
100.0000 mg | ORAL_TABLET | Freq: Every day | ORAL | Status: DC
  Administered 2017-07-09 – 2017-07-10 (×2): 100 mg via ORAL
  Filled 2017-07-08 (×4): qty 1

## 2017-07-08 MED ORDER — DARUNAVIR-COBICISTAT 800-150 MG PO TABS
1.0000 | ORAL_TABLET | Freq: Every day | ORAL | Status: DC
  Administered 2017-07-09 – 2017-07-10 (×2): 1 via ORAL
  Filled 2017-07-08 (×3): qty 1

## 2017-07-08 MED ORDER — IBUPROFEN 800 MG PO TABS
800.0000 mg | ORAL_TABLET | Freq: Three times a day (TID) | ORAL | Status: DC | PRN
  Administered 2017-07-10: 800 mg via ORAL
  Filled 2017-07-08: qty 1
  Filled 2017-07-08: qty 4
  Filled 2017-07-08: qty 1

## 2017-07-08 MED ORDER — DIPHENHYDRAMINE HCL 25 MG PO TABS
100.0000 mg | ORAL_TABLET | Freq: Every evening | ORAL | Status: DC | PRN
  Filled 2017-07-08: qty 4

## 2017-07-08 MED ORDER — FOLIC ACID 1 MG PO TABS
1.0000 mg | ORAL_TABLET | Freq: Every day | ORAL | Status: DC
  Administered 2017-07-09 – 2017-07-10 (×2): 1 mg via ORAL
  Filled 2017-07-08 (×2): qty 1

## 2017-07-08 MED ORDER — KCL IN DEXTROSE-NACL 20-5-0.9 MEQ/L-%-% IV SOLN
INTRAVENOUS | Status: DC
  Administered 2017-07-08 – 2017-07-09 (×6): via INTRAVENOUS
  Filled 2017-07-08 (×5): qty 1000

## 2017-07-08 MED ORDER — SODIUM CHLORIDE 0.9 % IV SOLN: 250.0000 mL | INTRAVENOUS | Status: DC | PRN

## 2017-07-08 MED ORDER — SODIUM CHLORIDE 0.9% FLUSH: 3.0000 mL | Freq: Two times a day (BID) | INTRAVENOUS | Status: DC

## 2017-07-08 MED ORDER — SODIUM CHLORIDE 0.9% FLUSH: 3.0000 mL | INTRAVENOUS | Status: DC | PRN

## 2017-07-08 MED ORDER — DEXAMETHASONE SODIUM PHOSPHATE 10 MG/ML IJ SOLN
10.0000 mg | Freq: Four times a day (QID) | INTRAMUSCULAR | Status: DC
  Administered 2017-07-08 – 2017-07-10 (×7): 10 mg via INTRAVENOUS
  Filled 2017-07-08 (×7): qty 1

## 2017-07-08 MED ORDER — OXYCODONE HCL 5 MG PO TABS
5.0000 mg | ORAL_TABLET | ORAL | Status: DC | PRN
  Administered 2017-07-08 – 2017-07-10 (×8): 10 mg via ORAL
  Filled 2017-07-08 (×8): qty 2

## 2017-07-08 MED ORDER — BUPROPION HCL ER (XL) 150 MG PO TB24: 300.0000 mg | ORAL_TABLET | Freq: Every day | ORAL | Status: DC

## 2017-07-08 NOTE — ED Triage Notes (Signed)
Pt presents for evaluation of post op neck pain, swelling, and difficulty swallowing. Pt had cervical spine surgery two days ago. Reports worsening numbness.

## 2017-07-08 NOTE — Progress Notes (Signed)
Patient has only received 1st dose of decadron; he is requesting food; explained admission orders per MD; continue sips and chips only; continue to monitor.

## 2017-07-08 NOTE — Progress Notes (Signed)
Patient is now requesting food at this time.  Became upset when advised that orders are still for patient to be NPO except of chips & sips with meds.   Patient advised that he was leaving if he could not eat & feels that he is not being treated right.  RN once again educated patient importance of being NPO  RN will continue to monitor patient

## 2017-07-08 NOTE — Progress Notes (Signed)
Patient up walking around the room; patient c/o hunger; frequently requesting ice and drinks; patient states, "I can swallow with no problem;call the MD"; page placed to Dr.Cram on call MD; verbal instruction given that patient is to remain on sips and chips until further administration of IV decadron is received and swelling is reassessed by MD.  Follow up can be called late this evening.

## 2017-07-08 NOTE — ED Notes (Signed)
Attempted to call report on floor

## 2017-07-08 NOTE — ED Provider Notes (Signed)
[facility] Provider Note   CSN: [id number] Arrival date & time: 07/08/17  1224     History   Chief Complaint Chief Complaint  Patient presents with  . Post-op Problem    HPI [name] is a 57 y.o. male.  HPI Patient presents to the emergency room with complaints of throat pain, swelling , difficulty breathing and swallowing.  Patient was just discharged from the hospital yesterday.  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  He underwent surgery on the 28th.  He had a discectomy with placement of interbody plate and screws.  Patient states since leaving the hospital he has had increased swelling.  He feels like it is difficult for him to breathe and swallow.  His pain was increasing so he came to the emergency room.  Patient denies any fevers.  No recent injuries or falls.  No new numbness or weakness. Past Medical History:  Diagnosis Date  . 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 Hemorrhoids   . HIV (human immunodeficiency virus infection) (HCC)   . Stenosis of cervical spine    withb myelopathy    Patient Active Problem List   Diagnosis Date Noted  . Stenosis of cervical spine with myelopathy (HCC) 07/06/2017  . Left-sided weakness 06/27/2017  . Tricompartment osteoarthritis of right knee 03/27/2017  . Alcohol abuse with alcohol-induced mood disorder (HCC) 09/25/2016  . Polysubstance dependence (HCC) 09/25/2016  . Substance use disorder 09/19/2016  . 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 HIV disease (HCC) 11/09/2008    Past Surgical History:  Procedure Laterality Date  . HERNIA REPAIR  97   lt ing  . RECTAL EXAM UNDER ANESTHESIA N/A 04/24/2014   Procedure:  ligation of bleeding vessels;  Surgeon: Almond LintFaera Byerly, MD;  Location: [facility];  Service: General;  Laterality: N/A;  . TENDON REPAIR     2006-lt  index  . WART FULGURATION  07/06/2011   Procedure: FULGURATION ANAL WART;  Surgeon: Shelly Rubensteinouglas A Blackman, MD;  Location: [facility];  Service: General;  Laterality: N/A;  excision anal condyloma  . WART FULGURATION N/A 04/23/2014   Procedure: EXCISION OF ANAL CONDYLOMA;  Surgeon: Abigail Miyamotoouglas Blackman, MD;  Location: [facility];  Service: General;  Laterality: N/A;        Home Medications    Prior to Admission medications   Medication Sig Start Date End Date Taking? Authorizing Provider  acetaminophen (TYLENOL) 325 MG tablet Take 2 tablets (650 mg total) by mouth every 6 (six) hours as needed for mild pain (or Fever >/= 101). 06/29/17   Elgergawy, Leana Roeawood S, MD  buPROPion (WELLBUTRIN XL) 300 MG 24 hr tablet Take 1 tablet (300 mg total) by mouth every morning. For depression 09/21/16   Armandina StammerNwoko, Agnes I, NP  darunavir-cobicistat (PREZCOBIX) 800-150 MG tablet Take 1 tablet by mouth daily. Swallow whole. Do NOT crush, break or chew tablets. Take with food, take with Descovy. 03/27/17   Veryl Speakalone, Gregory D, FNP  diphenhydrAMINE (BENADRYL) 50 MG tablet Take 100 mg by mouth at bedtime as needed for sleep.    [provider]  emtricitabine-tenofovir AF (DESCOVY) 200-25 MG tablet Take 1 tablet by mouth daily. Take with Prezcobix. 03/27/17   Veryl Speakalone, Gregory D, FNP  folic acid (FOLVITE) 1 MG tablet Take 1 tablet (1 mg total) by mouth daily. 06/30/17   Elgergawy, Leana Roeawood S, MD  ibuprofen (ADVIL,MOTRIN) 800 MG tablet Take 800 mg by mouth every 8 (eight) hours as needed.    [provider]  oxyCODONE (OXY IR/ROXICODONE) 5 MG immediate release tablet Take 1-2 tablets (5-10 mg total) by mouth every 3 (three) hours as needed for severe pain ((score 7 to 10)). 07/07/17   Donalee Citrin, MD  thiamine 100 MG tablet Take 1 tablet (100 mg total) by mouth daily. 06/30/17   Elgergawy, Leana Roe, MD    Family History Family History  Problem Relation Age of Onset  . Cancer Mother        brain  . Cancer Sister         breast    Social History Social History   Tobacco Use  . Smoking status: Current Every Day Smoker    Packs/day: 0.50    Years: 25.00    Pack years: 12.50    Types: Cigarettes  . Smokeless tobacco: Never Used  Substance Use Topics  . Alcohol use: Yes    Alcohol/week: 4.2 - 4.8 oz    Types: 7 - 8 Cans of beer per week    Comment: a case every 3 to 4 days  . Drug use: No     Allergies   Shellfish allergy and Sulfa antibiotics   Review of Systems Review of Systems  All other systems reviewed and are negative.    Physical Exam Updated Vital Signs BP (!) 144/88   Pulse (!) 55   Temp 98 F (36.7 C) (Oral)   Resp 13   SpO2 99%   Physical Exam  Constitutional: No distress.  HENT:  Head: Normocephalic and atraumatic.  Right Ear: External ear normal.  Left Ear: External ear normal.  Patient is able to speak in full voice without any hoarseness  Eyes: Conjunctivae are normal. Right eye exhibits no discharge. Left eye exhibits no discharge. No scleral icterus.  Neck: Neck supple. No tracheal deviation present.  Cyst noted in the right posterior neck area, patient states this is old and not new, he does have evidence of edema and ecchymoses in the anterior inferior aspect of his neck.  No signs of crepitus or erythema  Cardiovascular: Normal rate, regular rhythm and intact distal pulses.  Pulmonary/Chest: Effort normal and breath sounds normal. No stridor. No respiratory distress. He has no wheezes. He has no rales.  No evidence of stridor  Abdominal: Soft. Bowel sounds are normal. He exhibits no distension. There is no tenderness. There is no rebound and no guarding.  Musculoskeletal: He exhibits no edema or tenderness.  Neurological: He is alert. He has normal strength. No cranial nerve deficit (no facial droop, extraocular movements intact, no slurred speech) or sensory deficit. He exhibits normal muscle tone. He displays no seizure activity. Coordination normal.    Skin: Skin is warm and dry. No rash noted. He is not diaphoretic.  Psychiatric: He has a normal mood and affect.  Nursing note and vitals reviewed.    ED Treatments / Results  Labs (all labs ordered are listed, but only abnormal results are displayed) Labs Reviewed  CBC  BASIC METABOLIC PANEL  PROTIME-INR     Radiology Dg Cervical Spine 1 View  Result Date: 07/06/2017 CLINICAL DATA:  ACDF C3-C4, C4-C5 2 levels. Fluoro time: 10 sec EXAM: DG C-ARM 61-120 MIN; DG CERVICAL SPINE - 1 VIEW COMPARISON:  MRI 06/27/2016 FINDINGS: The patient has undergone anterior fusion from C3-C5. There are interbody fusion devices at C3-4 and C4-5. No interval fractures. IMPRESSION:  Status post anterior fusion C3-5. Electronically Signed   By: Norva Pavlov M.D.   On: 07/06/2017 15:09   Dg C-arm 1-60 Min  Result Date: 07/06/2017 CLINICAL DATA:  ACDF C3-C4, C4-C5 2 levels. Fluoro time: 10 sec EXAM: DG C-ARM 61-120 MIN; DG CERVICAL SPINE - 1 VIEW COMPARISON:  MRI 06/27/2016 FINDINGS: The patient has undergone anterior fusion from C3-C5. There are interbody fusion devices at C3-4 and C4-5. No interval fractures. IMPRESSION: Status post anterior fusion C3-5. Electronically Signed   By: Norva Pavlov M.D.   On: 07/06/2017 15:09    Procedures Procedures (including critical care time)  Medications Ordered in ED Medications  morphine 4 MG/ML injection 4 mg (4 mg Intravenous Given 07/08/17 1328)     Initial Impression / Assessment and Plan / ED Course  I have reviewed the triage vital signs and the nursing notes.  Pertinent labs & imaging results that were available during my care of the patient were reviewed by me and considered in my medical decision making (see chart for details).  Clinical Course as of Jul 08 1340  Sun Jul 08, 2017  1312 Patient does appear to have some evidence of postoperative swelling and ecchymoses.  He is not having any difficulty handling his secretions and is breathing  without signs of stridor or distress.   [JK]  1341 Pt was seen by Dr Jordan Likes in the ED.  Will admit for pain management.   [JK]  1342 Labs pending   [JK]    Clinical Course User Index [JK] Linwood Dibbles, MD    Pt presents with post op pain and swelling.  No signs of acute airway issues.  No difficulty handling secretions.  Suspect discomfort associated with his surgery.  Dr Jordan Likes has evaluated the patient and will admit him for pain management.   Final Clinical Impressions(s) / ED Diagnoses   Final diagnoses:  Post-operative pain      Linwood Dibbles, MD 07/08/17 1342

## 2017-07-08 NOTE — Progress Notes (Signed)
Patient was upset that is NPO  RN educated patient about the importance of being NPO due to the fact that when he first arrived to the hospital he was complaining of having difficulty swallowing.  Patient became more calm & understanding as to why is NPO  RN will continue to monitor patient

## 2017-07-08 NOTE — Progress Notes (Addendum)
Patient arrived from ED; patient is alert and oriented; soft tissue swelling noted to the right side of the neck; patient is post op from ACDF;patient is c/o  Pain; no drainage to dressing site; clean dry and intact. Oriented patient to room and unit routine. Patient anxious to eat and drink; explained sips and ice chips with medications.

## 2017-07-08 NOTE — H&P (Signed)
[name] is an 57 y.o. male.   Chief Complaint: Neck pain, difficulty swallowing. HPI: 57 year old male 36-monthday status post 2 level anterior cervical decompression and fusion.  Patient represents to emergency department with difficulty swallowing and difficulty managing his neck pain.  Patient without new numbness, paresthesias or weakness in his upper extremities.  Patient talking and able to handle his own secretions.  No history of fever.  Past Medical History:  Diagnosis Date  . 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 Hemorrhoids   . HIV [place]human [place] virus infection) ([place]   . Stenosis of cervical spine    withb myelopathy    Past Surgical History:  Procedure Laterality Date  . HERNIA REPAIR  97   lt ing  . RECTAL EXAM UNDER ANESTHESIA N/A 04/24/2014   Procedure:  ligation of bleeding vessels;  Surgeon: FStark Klein MD;  Location: [facility];  Service: General;  Laterality: N/A;  . TENDON REPAIR     2006-lt index  . WART FULGURATION  07/06/2011   Procedure: FULGURATION ANAL WART;  Surgeon: DHarl Bowie MD;  [facility] [facility]  Service: General;  Laterality: N/A;  excision anal condyloma  . WART FULGURATION N/A 04/23/2014   Procedure: EXCISION OF ANAL CONDYLOMA;  Surgeon: DCoralie Keens MD;  Location: [facility];  Service: General;  Laterality: N/A;    Family History  Problem Relation Age of Onset  . Cancer Mother        brain  . Cancer Sister        breast   Social History:  reports that he has been smoking cigarettes.  He has a 12.50 pack-year smoking history. He has never used smokeless tobacco. He reports that he drinks about 4.2 - 4.8 oz of alcohol per week. He reports that he does not use drugs.  Allergies:  Allergies  Allergen Reactions  . Shellfish Allergy Anaphylaxis and Swelling    Swelling everywhere  . Sulfa Antibiotics Anaphylaxis, Hives, Itching and Swelling    Swelling all over      (Not in a hospital admission)  Results  for orders placed or performed during the hospital encounter of 07/06/17 (from the past 48 hour(s))  CBC     Status: Abnormal   Collection Time: 07/07/17  5:24 AM  Result Value Ref Range   WBC 10.2 4.0 - 10.5 K/uL   RBC 4.25 4.22 - 5.81 MIL/uL   Hemoglobin 11.6 (L) 13.0 - 17.0 g/dL   HCT 37.0 (L) 39.0 - 52.0 %   MCV 87.1 78.0 - 100.0 fL   MCH 27.3 26.0 - 34.0 pg   MCHC 31.4 30.0 - 36.0 g/dL   RDW 13.9 11.5 - 15.5 %   Platelets 263 150 - 400 K/uL    Comment: Performed at [facility] [address] [place] [id number] Basic Metabolic Panel     Status: Abnormal   Collection Time: 07/07/17  5:24 AM  Result Value Ref Range   Sodium 134 (L) 135 - 145 mmol/L   Potassium 4.2 3.5 - 5.1 mmol/L   Chloride 101 98 - 111 mmol/L    Comment: Please note change in reference range.   CO2 26 22 - 32 mmol/L   Glucose, Bld 112 (H) 70 - 99 mg/dL    Comment: Please note change in reference range.   BUN 10 6 - 20 mg/dL    Comment: Please note change in reference range.   Creatinine, Ser 0.96 0.61 - 1.24  mg/dL   Calcium 8.8 (L) 8.9 - 10.3 mg/dL   GFR calc non Af Amer >60 >60 mL/min   GFR calc Af Amer >60 >60 mL/min    Comment: (NOTE) The eGFR has been calculated using the CKD EPI equation. This calculation has not been validated in all clinical situations. eGFR's persistently <60 mL/min signify possible Chronic Kidney Disease.    Anion gap 7 5 - 15    Comment: Performed at [facility] [address] [place], [place] [address]  Protime-INR     Status: None   Collection Time: 07/07/17  5:24 AM  Result Value Ref Range   Prothrombin Time 13.4 11.4 - 15.2 seconds   INR 1.02     Comment: Performed at [facility] [address] [place], [place] [address]  APTT     Status: None   Collection Time: 07/07/17  5:24 AM  Result Value Ref Range   aPTT 29 24 - 36 seconds    Comment: Performed at [facility] [address], [place], [place] [address]   Dg Cervical  Spine 1 View  Result Date: 07/06/2017 CLINICAL DATA:  ACDF C3-C4, C4-C5 2 levels. Fluoro time: 10 sec EXAM: DG C-ARM 61-120 MIN; DG CERVICAL SPINE - 1 VIEW COMPARISON:  MRI 06/27/2016 FINDINGS: The patient has undergone anterior fusion from C3-C5. There are interbody fusion devices at C3-4 and C4-5. No interval fractures. IMPRESSION: Status post anterior fusion C3-5. Electronically Signed   By: Nolon Nations M.D.   On: 07/06/2017 15:09   Dg C-arm 1-60 Min  Result Date: 07/06/2017 CLINICAL DATA:  ACDF C3-C4, C4-C5 2 levels. Fluoro time: 10 sec EXAM: DG C-ARM 61-120 MIN; DG CERVICAL SPINE - 1 VIEW COMPARISON:  MRI 06/27/2016 FINDINGS: The patient has undergone anterior fusion from C3-C5. There are interbody fusion devices at C3-4 and C4-5. No interval fractures. IMPRESSION: Status post anterior fusion C3-5. Electronically Signed   By: Nolon Nations M.D.   On: 07/06/2017 15:09    Pertinent items noted in HPI and remainder of comprehensive ROS otherwise negative.  Blood pressure (!) 144/88, pulse (!) 55, temperature 98 F (36.7 C), temperature source Oral, resp. rate 13, SpO2 99 %.  Patient is awake and alert.  He is oriented and reasonably appropriate.  He is obviously uncomfortable.  Speech is fluent.  Judgment and insight are intact.  Cranial nerve function normal bilateral.  Motor examination with 5/5 strength in right upper and lower extremities.  Mild diffuse weakness left upper and lower extremities with some early spasticity.  Sensory examination stable.  Examination of his neck reveals normal postoperative change with some mild superficial swelling but no evidence of deep hematoma.  Airway midline.  No evidence of stridor.  Examination head ears eyes and throat is unremarkable her chest and abdomen are benign.  Extremities are free from injury deformity. Assessment/Plan Status post 2 level anterior cervical decompression and fusion.  Patient presents with increasing neck pain and some  dysphagia.  Plan hospital admission for pain control and observation.  Plan IV steroids to help with swelling.  Mallie Mussel A Myrtle Haller 07/08/2017, 1:34 PM

## 2017-07-09 ENCOUNTER — Other Ambulatory Visit: Payer: Self-pay

## 2017-07-09 ENCOUNTER — Encounter (HOSPITAL_COMMUNITY): Payer: Self-pay

## 2017-07-09 DIAGNOSIS — Z91013 Allergy to seafood: Secondary | ICD-10-CM | POA: Diagnosis not present

## 2017-07-09 DIAGNOSIS — R1310 Dysphagia, unspecified: Secondary | ICD-10-CM | POA: Diagnosis present

## 2017-07-09 DIAGNOSIS — F17210 Nicotine dependence, cigarettes, uncomplicated: Secondary | ICD-10-CM | POA: Diagnosis present

## 2017-07-09 DIAGNOSIS — K9189 Other postprocedural complications and disorders of digestive system: Secondary | ICD-10-CM | POA: Diagnosis present

## 2017-07-09 DIAGNOSIS — Y838 Other surgical procedures as the cause of abnormal reaction of the patient, or of later complication, without mention of misadventure at the time of the procedure: Secondary | ICD-10-CM | POA: Diagnosis present

## 2017-07-09 DIAGNOSIS — Z882 Allergy status to sulfonamides status: Secondary | ICD-10-CM | POA: Diagnosis not present

## 2017-07-09 DIAGNOSIS — B20 Human immunodeficiency virus [HIV] disease: Secondary | ICD-10-CM | POA: Diagnosis present

## 2017-07-09 MED ORDER — MORPHINE SULFATE (PF) 2 MG/ML IV SOLN
2.0000 mg | INTRAVENOUS | Status: DC | PRN
  Administered 2017-07-09: 2 mg via INTRAVENOUS
  Filled 2017-07-09: qty 1

## 2017-07-09 MED ORDER — DOCUSATE SODIUM 100 MG PO CAPS
100.0000 mg | ORAL_CAPSULE | Freq: Every day | ORAL | Status: DC
  Administered 2017-07-09 – 2017-07-10 (×2): 100 mg via ORAL
  Filled 2017-07-09 (×2): qty 1

## 2017-07-09 MED ORDER — BISACODYL 5 MG PO TBEC
5.0000 mg | DELAYED_RELEASE_TABLET | Freq: Every day | ORAL | Status: DC | PRN
  Administered 2017-07-09: 10 mg via ORAL
  Filled 2017-07-09: qty 2

## 2017-07-09 NOTE — Progress Notes (Signed)
Subjective:  The patient is alert and pleasant. He feels better. He says his swelling has gone down. He wants to eat. He does not feel he is getting adequate pain control with this current regimen.  Objective: Vital signs in last 24 hours: Temp:  [97.9 F (36.6 C)-98.3 F (36.8 C)] 98 F (36.7 C) (07/01 0828) Pulse Rate:  [52-64] 61 (07/01 0828) Resp:  [13-20] 20 (07/01 0828) BP: (113-155)/(71-103) 151/84 (07/01 0828) SpO2:  [98 %-100 %] 100 % (07/01 0828) Estimated body mass index is 23.33 kg/m as calculated from the following:   Height as of 07/06/17: 6' (1.829 m).   Weight as of 07/06/17: 78 kg (172 lb).   Intake/Output from previous day: 06/30 0701 - 07/01 0700 In: 1033.7 [I.V.:1033.7] Out: -  Intake/Output this shift: No intake/output data recorded.  Physical exam the patient is alert and oriented. He is moving all 4 extremities well. He has a moderate hematoma without shift. There is no stridor. He looks well.  Lab Results: Recent Labs    07/07/17 0524 07/08/17 1326  WBC 10.2 10.5  HGB 11.6* 10.6*  HCT 37.0* 34.4*  PLT 263 245   BMET Recent Labs    07/07/17 0524 07/08/17 1326  NA 134* 137  K 4.2 3.9  CL 101 103  CO2 26 27  GLUCOSE 112* 97  BUN 10 11  CREATININE 0.96 0.98  CALCIUM 8.8* 8.9    Studies/Results: No results found.  Assessment/Plan: Postoperative dysphagia, hematoma: The patient is doing well clinically and is improving with steroids. I'll start a regular diet.. I'll add morphine for pain control but I encouraged him to stick with the by mouth medications. He may be able to go home tomorrow.  LOS: 0 days     Cristi LoronJeffrey D Breanna Mcdaniel 07/09/2017, 10:54 AM

## 2017-07-10 MED ORDER — METHYLPREDNISOLONE 4 MG PO TBPK: ORAL_TABLET | ORAL | 0 refills | Status: DC

## 2017-07-10 MED ORDER — OXYCODONE-ACETAMINOPHEN 7.5-325 MG PO TABS: 1.0000 | ORAL_TABLET | ORAL | 0 refills | Status: DC | PRN

## 2017-07-10 MED ORDER — METHOCARBAMOL 750 MG PO TABS: 750.0000 mg | ORAL_TABLET | Freq: Three times a day (TID) | ORAL | 1 refills | Status: DC | PRN

## 2017-07-10 NOTE — Care Management Note (Signed)
Case Management Note  Patient Details  Name: [name] MRN: [id number] Date of Birth: 1960-07-05  Subjective/Objective:     Pt admitted with dysphagia. Pt s/p recent cervical surgery. He is from home alone.                Action/Plan: Pt discharging home with self care. Pt has hospital f/u and transportation home.   Expected Discharge Date:  07/10/17               Expected Discharge Plan:  Home/Self Care  In-House Referral:     Discharge planning Services     Post Acute Care Choice:    Choice offered to:     DME Arranged:    DME Agency:     HH Arranged:    HH Agency:     Status of Service:  Completed, signed off  If discussed at [employer] Length of Stay Meetings, dates discussed:    Additional Comments:  Kermit BaloKelli F Cheskel Silverio, RN 07/10/2017, 11:05 AM

## 2017-07-10 NOTE — Discharge Summary (Signed)
Physician Discharge Summary  Patient ID: [name] MRN: [id number] DOB/AGE: [birth year] 56 y.o.  Admit date: 07/08/2017 Discharge date: 07/10/2017  Admission Diagnoses:  Dysphagia  Discharge Diagnoses:  Same Active Problems:   Dysphagia   Discharged Condition: Stable  Hospital Course:  [name] is a 57 y.o. male s/p C3-4 and C4-5 ACDF Dr Conchita Paris n 6/28. He was readmitted 6/30 due to hematoma and post operative dysphgia. He was placed on IV steroids and NPO. The steroids helped significantly with dysphagia and hematoma. He feels comfortable going home.  At time of discharge, pain was well controlled, dysphagia and hematoma much improved, tolerating po, voiding normal. Ready for discharge.   Treatments: Surgery - none  Discharge Exam: Blood pressure (!) 142/85, pulse 66, temperature 97.6 F (36.4 C), temperature source Oral, resp. rate 18, height 6' (1.829 m), weight 78 kg (171 lb 15.3 oz), SpO2 97 %. Awake, alert, oriented Speech fluent, appropriate CN grossly intact 5/5 BUE/BLE Wound c/d/i,. Mild swelling  Disposition:    Allergies as of 07/10/2017      Reactions   Shellfish Allergy Anaphylaxis, Swelling   Swelling everywhere   Sulfa Antibiotics Anaphylaxis, Hives, Itching, Swelling   Swelling all over       Medication List    STOP taking these medications   oxyCODONE 5 MG immediate release tablet Commonly known as:  Oxy IR/ROXICODONE     TAKE these medications   acetaminophen 325 MG tablet Commonly known as:  TYLENOL Take 2 tablets (650 mg total) by mouth every 6 (six) hours as needed for mild pain (or Fever >/= 101).   buPROPion 300 MG 24 hr tablet Commonly known as:  WELLBUTRIN XL Take 1 tablet (300 mg total) by mouth every morning. For depression   darunavir-cobicistat 800-150 MG tablet Commonly known as:  PREZCOBIX Take 1 tablet by mouth daily. Swallow whole. Do NOT crush, break or chew tablets. Take with food, take with Descovy.    diphenhydrAMINE 50 MG tablet Commonly known as:  BENADRYL Take 100 mg by mouth at bedtime as needed for sleep.   emtricitabine-tenofovir AF 200-25 MG tablet Commonly known as:  DESCOVY Take 1 tablet by mouth daily. Take with Prezcobix.   folic acid 1 MG tablet Commonly known as:  FOLVITE Take 1 tablet (1 mg total) by mouth daily.   ibuprofen 800 MG tablet Commonly known as:  ADVIL,MOTRIN Take 800 mg by mouth every 8 (eight) hours as needed.   methocarbamol 750 MG tablet Commonly known as:  ROBAXIN-750 Take 1 tablet (750 mg total) by mouth 3 (three) times daily as needed for muscle spasms.   methylPREDNISolone 4 MG Tbpk tablet Commonly known as:  MEDROL Take according to package insert   oxyCODONE-acetaminophen 7.5-325 MG tablet Commonly known as:  PERCOCET Take 1-2 tablets by mouth every 4 (four) hours as needed for severe pain.   thiamine 100 MG tablet Take 1 tablet (100 mg total) by mouth daily.      Follow-up Information    Lisbeth Renshaw, MD Follow up.   Specialty:  Neurosurgery Contact information: [address] [place] [address] [phone]           Signed: Alyson Ingles 07/10/2017, 9:41 AM

## 2017-07-10 NOTE — Progress Notes (Signed)
Neurosurgery Progress Note  No issues overnight. Dysphagia much improved. Able to tolerate po without difficulty. No breathing difficulties. Hematoma decreased in size signficantly per patient Eager to go home  EXAM:  BP (!) 142/85 (BP Location: Left Arm)   Pulse 66   Temp 97.6 F (36.4 C) (Oral)   Resp 18   Ht 6' (1.829 m)   Wt 78 kg (171 lb 15.3 oz)   SpO2 97%   BMI 23.32 kg/m   Awake, alert, oriented  Speech fluent, appropriate  CN grossly intact  5/5 BUE/BLE, minimal swelling  PLAN Much improved dysphagia and decrease in hematoma.  Would like to go home. D/C orders signed

## 2017-07-16 ENCOUNTER — Ambulatory Visit (INDEPENDENT_AMBULATORY_CARE_PROVIDER_SITE_OTHER): Payer: Medicare Other | Admitting: Internal Medicine

## 2017-07-16 VITALS — BP 141/87 | HR 85 | Temp 98.1°F | Wt 162.0 lb

## 2017-07-16 DIAGNOSIS — B20 Human immunodeficiency virus [HIV] disease: Secondary | ICD-10-CM | POA: Diagnosis present

## 2017-07-16 DIAGNOSIS — Z7289 Other problems related to lifestyle: Secondary | ICD-10-CM

## 2017-07-16 DIAGNOSIS — Z981 Arthrodesis status: Secondary | ICD-10-CM

## 2017-07-16 DIAGNOSIS — Z789 Other specified health status: Secondary | ICD-10-CM

## 2017-07-16 NOTE — Progress Notes (Signed)
RFV: follow up for hiv disease  Patient ID: [name], male   DOB: 1960-11-22, 57 y.o.   MRN: [id number]  HPI [name], [name] with hiv disease, on descovy-DRVc, CD 4 count of 470/VL<20 . He was hospitalized recently for radiculopathy and found to have cervical stenosis, he underwent C3-4 and C4-5 ACDF Dr Conchita Paris n 6/28. He was readmitted 6/30 due to hematoma and post operative dysphgia. He is improved with dysphagia and lesion on his neck has reduced in size. No fever, chills, nightsweats.    Outpatient Encounter Medications as of 07/16/2017  Medication Sig  . buPROPion (WELLBUTRIN XL) 300 MG 24 hr tablet Take 1 tablet (300 mg total) by mouth every morning. For depression  . darunavir-cobicistat (PREZCOBIX) 800-150 MG tablet Take 1 tablet by mouth daily. Swallow whole. Do NOT crush, break or chew tablets. Take with food, take with Descovy.  . diphenhydrAMINE (BENADRYL) 50 MG tablet Take 100 mg by mouth at bedtime as needed for sleep.  Marland Kitchen emtricitabine-tenofovir AF (DESCOVY) 200-25 MG tablet Take 1 tablet by mouth daily. Take with Prezcobix.  Marland Kitchen ibuprofen (ADVIL,MOTRIN) 800 MG tablet Take 800 mg by mouth every 8 (eight) hours as needed.  . methocarbamol (ROBAXIN-750) 750 MG tablet Take 1 tablet (750 mg total) by mouth 3 (three) times daily as needed for muscle spasms.  . methylPREDNISolone (MEDROL) 4 MG TBPK tablet Take according to package insert  . oxyCODONE-acetaminophen (PERCOCET) 7.5-325 MG tablet Take 1-2 tablets by mouth every 4 (four) hours as needed for severe pain.  Marland Kitchen thiamine 100 MG tablet Take 1 tablet (100 mg total) by mouth daily.  Marland Kitchen acetaminophen (TYLENOL) 325 MG tablet Take 2 tablets (650 mg total) by mouth every 6 (six) hours as needed for mild pain (or Fever >/= 101). (Patient not taking: Reported on 07/08/2017)  . folic acid (FOLVITE) 1 MG tablet Take 1 tablet (1 mg total) by mouth daily. (Patient not taking: Reported on 07/08/2017)   No facility-administered encounter  medications on file as of 07/16/2017.      Patient Active Problem List   Diagnosis Date Noted  . Dysphagia 07/08/2017  . Stenosis of cervical spine with myelopathy (HCC) 07/06/2017  . Left-sided weakness 06/27/2017  . Tricompartment osteoarthritis of right knee 03/27/2017  . Alcohol abuse with alcohol-induced mood disorder (HCC) 09/25/2016  . Polysubstance dependence (HCC) 09/25/2016  . Substance use disorder 09/19/2016  . 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 HIV disease (HCC) 11/09/2008     Health Maintenance Due  Topic Date Due  . Hepatitis C Screening  Mar 13, 1960  . TETANUS/TDAP  02/07/1980  . COLONOSCOPY  02/07/2011     Review of Systems Mild neck pain from surgery Physical Exam   BP (!) 141/87   Pulse 85   Temp 98.1 F (36.7 C) (Oral)   Wt 162 lb (73.5 kg)   BMI 21.97 kg/m   Physical Exam  Constitutional: He is oriented to person, place, and time. He appears well-developed and well-nourished. No distress.  HENT: right sided posterior lipoma but also has 1.5inch raised fluid filled lesion at his incision. No leakage Mouth/Throat: Oropharynx is clear and moist. No oropharyngeal exudate.  Cardiovascular: Normal rate, regular rhythm and normal heart sounds. Exam reveals no gallop and no friction rub.  No murmur heard.  Pulmonary/Chest: Effort normal and breath sounds normal. No respiratory distress. He has no wheezes.  Abdominal: Soft. Bowel sounds are normal. He exhibits no distension.  There is no tenderness.  Lymphadenopathy:  He has no cervical adenopathy.  Neurological: He is alert and oriented to person, place, and time.  Skin: Skin is warm and dry. No rash noted. No erythema.  Psychiatric: He has a normal mood and affect. His behavior is normal.    Lab Results  Component Value Date   CD4TCELL 23 (L) 03/14/2017   Lab Results  Component Value Date   CD4TABS 470 03/14/2017    CD4TABS 370 (L) 06/16/2016   CD4TABS 310 (L) 02/26/2015   Lab Results  Component Value Date   HIV1RNAQUANT <20 DETECTED (A) 03/14/2017   No results found for: HEPBSAB Lab Results  Component Value Date   LABRPR Non Reactive 06/27/2017    CBC Lab Results  Component Value Date   WBC 10.5 07/08/2017   RBC 3.91 (L) 07/08/2017   HGB 10.6 (L) 07/08/2017   HCT 34.4 (L) 07/08/2017   PLT 245 07/08/2017   MCV 88.0 07/08/2017   MCH 27.1 07/08/2017   MCHC 30.8 07/08/2017   RDW 14.1 07/08/2017   LYMPHSABS 2.2 06/27/2017   MONOABS 0.5 06/27/2017   EOSABS 0.1 06/27/2017    BMET Lab Results  Component Value Date   NA 137 07/08/2017   K 3.9 07/08/2017   CL 103 07/08/2017   CO2 27 07/08/2017   GLUCOSE 97 07/08/2017   BUN 11 07/08/2017   CREATININE 0.98 07/08/2017   CALCIUM 8.9 07/08/2017   GFRNONAA >60 07/08/2017   GFRAA >60 07/08/2017      Assessment and Plan   hiv disease = well controlled. Continue descovy plus prezcobix  Hematoma vs. Seroma at recent incision site = exam does not suggest infection. Recommend to reach out to dr nundkumar's office to see if need to be seen sooner  Health promotion =prevnar 13 at next visit followed by Pneumovax in 2 months  Alcohol use = recommend to decrease intake

## 2017-07-17 LAB — CBC WITH DIFFERENTIAL/PLATELET
BASOS PCT: 0.3 %
Basophils Absolute: 43 cells/uL (ref 0–200)
EOS PCT: 0.1 %
Eosinophils Absolute: 14 cells/uL — ABNORMAL LOW (ref 15–500)
HCT: 37.5 % — ABNORMAL LOW (ref 38.5–50.0)
HEMOGLOBIN: 12.8 g/dL — AB (ref 13.2–17.1)
LYMPHS ABS: 1973 {cells}/uL (ref 850–3900)
MCH: 27.9 pg (ref 27.0–33.0)
MCHC: 34.1 g/dL (ref 32.0–36.0)
MCV: 81.9 fL (ref 80.0–100.0)
MONOS PCT: 6 %
MPV: 9.7 fL (ref 7.5–12.5)
NEUTROS ABS: 11506 {cells}/uL — AB (ref 1500–7800)
Neutrophils Relative %: 79.9 %
PLATELETS: 392 10*3/uL (ref 140–400)
RBC: 4.58 10*6/uL (ref 4.20–5.80)
RDW: 15 % (ref 11.0–15.0)
Total Lymphocyte: 13.7 %
WBC mixed population: 864 cells/uL (ref 200–950)
WBC: 14.4 10*3/uL — AB (ref 3.8–10.8)

## 2017-07-17 LAB — COMPLETE METABOLIC PANEL WITH GFR
AG Ratio: 1.6 (calc) (ref 1.0–2.5)
ALBUMIN MSPROF: 4.7 g/dL (ref 3.6–5.1)
ALT: 24 U/L (ref 9–46)
AST: 16 U/L (ref 10–35)
Alkaline phosphatase (APISO): 41 U/L (ref 40–115)
BILIRUBIN TOTAL: 0.8 mg/dL (ref 0.2–1.2)
BUN: 16 mg/dL (ref 7–25)
CHLORIDE: 99 mmol/L (ref 98–110)
CO2: 29 mmol/L (ref 20–32)
Calcium: 9.9 mg/dL (ref 8.6–10.3)
Creat: 1.05 mg/dL (ref 0.70–1.33)
GFR, EST AFRICAN AMERICAN: 92 mL/min/{1.73_m2} (ref >=60)
GFR, Est Non African American: 79 mL/min/{1.73_m2} (ref >=60)
GLUCOSE: 104 mg/dL — AB (ref 65–99)
Globulin: 2.9 g/dL (calc) (ref 1.9–3.7)
Potassium: 4.6 mmol/L (ref 3.5–5.3)
Sodium: 136 mmol/L (ref 135–146)
TOTAL PROTEIN: 7.6 g/dL (ref 6.1–8.1)

## 2017-07-17 LAB — RPR: RPR: NONREACTIVE

## 2017-07-18 LAB — HIV-1 RNA QUANT-NO REFLEX-BLD
HIV 1 RNA QUANT: NOT DETECTED {copies}/mL
HIV-1 RNA QUANT, LOG: NOT DETECTED {Log_copies}/mL

## 2017-07-18 LAB — T-HELPER CELL (CD4) - (RCID CLINIC ONLY)
CD4 T CELL HELPER: 15 % — AB (ref 33–55)
CD4 T Cell Abs: 310 /uL — ABNORMAL LOW (ref 400–2700)

## 2017-07-19 ENCOUNTER — Other Ambulatory Visit: Payer: Self-pay

## 2017-07-19 ENCOUNTER — Encounter (HOSPITAL_COMMUNITY): Payer: Self-pay | Admitting: Emergency Medicine

## 2017-07-19 ENCOUNTER — Emergency Department (HOSPITAL_COMMUNITY)
Admission: EM | Admit: 2017-07-19 | Discharge: 2017-07-19 | Disposition: A | Discharge status: Home / Self Care | Payer: Medicare Other | Attending: Emergency Medicine | Admitting: Emergency Medicine

## 2017-07-19 DIAGNOSIS — F329 Major depressive disorder, single episode, unspecified: Secondary | ICD-10-CM | POA: Diagnosis not present

## 2017-07-19 DIAGNOSIS — Z79899 Other long term (current) drug therapy: Secondary | ICD-10-CM | POA: Insufficient documentation

## 2017-07-19 DIAGNOSIS — R4589 Other symptoms and signs involving emotional state: Secondary | ICD-10-CM | POA: Diagnosis not present

## 2017-07-19 DIAGNOSIS — F1010 Alcohol abuse, uncomplicated: Secondary | ICD-10-CM | POA: Insufficient documentation

## 2017-07-19 DIAGNOSIS — F17210 Nicotine dependence, cigarettes, uncomplicated: Secondary | ICD-10-CM | POA: Insufficient documentation

## 2017-07-19 DIAGNOSIS — B20 Human immunodeficiency virus [HIV] disease: Secondary | ICD-10-CM | POA: Diagnosis not present

## 2017-07-19 LAB — ACETAMINOPHEN LEVEL: Acetaminophen (Tylenol), Serum: <10 ug/mL — ABNORMAL LOW (ref 10–30)

## 2017-07-19 LAB — CBC
HEMATOCRIT: 39.1 % (ref 39.0–52.0)
Hemoglobin: 12.8 g/dL — ABNORMAL LOW (ref 13.0–17.0)
MCH: 28.3 pg (ref 26.0–34.0)
MCHC: 32.7 g/dL (ref 30.0–36.0)
MCV: 86.5 fL (ref 78.0–100.0)
PLATELETS: 340 10*3/uL (ref 150–400)
RBC: 4.52 MIL/uL (ref 4.22–5.81)
RDW: 15.3 % (ref 11.5–15.5)
WBC: 14.6 10*3/uL — AB (ref 4.0–10.5)

## 2017-07-19 LAB — RAPID URINE DRUG SCREEN, HOSP PERFORMED
AMPHETAMINES: NOT DETECTED
BENZODIAZEPINES: NOT DETECTED
Cocaine: NOT DETECTED
Opiates: NOT DETECTED
Tetrahydrocannabinol: NOT DETECTED

## 2017-07-19 LAB — COMPREHENSIVE METABOLIC PANEL
ALK PHOS: 39 U/L (ref 38–126)
ALT: 25 U/L (ref 0–44)
AST: 21 U/L (ref 15–41)
Albumin: 4.3 g/dL (ref 3.5–5.0)
Anion gap: 12 (ref 5–15)
BUN: 25 mg/dL — AB (ref 6–20)
CO2: 23 mmol/L (ref 22–32)
Calcium: 9.2 mg/dL (ref 8.9–10.3)
Chloride: 102 mmol/L (ref 98–111)
Creatinine, Ser: 1.46 mg/dL — ABNORMAL HIGH (ref 0.61–1.24)
GFR calc non Af Amer: 52 mL/min — ABNORMAL LOW (ref >60)
GLUCOSE: 82 mg/dL (ref 70–99)
POTASSIUM: 4.2 mmol/L (ref 3.5–5.1)
SODIUM: 137 mmol/L (ref 135–145)
Total Bilirubin: 0.9 mg/dL (ref 0.3–1.2)
Total Protein: 7 g/dL (ref 6.5–8.1)

## 2017-07-19 LAB — ETHANOL: Alcohol, Ethyl (B): 175 mg/dL — ABNORMAL HIGH (ref <10)

## 2017-07-19 LAB — SALICYLATE LEVEL: Salicylate Lvl: <7 mg/dL (ref 2.8–30.0)

## 2017-07-19 MED ORDER — OXYCODONE-ACETAMINOPHEN 5-325 MG PO TABS
2.0000 | ORAL_TABLET | Freq: Once | ORAL | Status: AC
  Administered 2017-07-19: 2 via ORAL
  Filled 2017-07-19: qty 2

## 2017-07-19 MED ORDER — CHLORDIAZEPOXIDE HCL 25 MG PO CAPS: ORAL_CAPSULE | ORAL | 0 refills | Status: DC

## 2017-07-19 MED ORDER — SODIUM CHLORIDE 0.9 % IV BOLUS
1000.0000 mL | Freq: Once | INTRAVENOUS | Status: AC
  Administered 2017-07-19: 1000 mL via INTRAVENOUS

## 2017-07-19 MED ORDER — THIAMINE HCL 100 MG/ML IJ SOLN
100.0000 mg | Freq: Once | INTRAMUSCULAR | Status: AC
  Administered 2017-07-19: 100 mg via INTRAVENOUS
  Filled 2017-07-19: qty 2

## 2017-07-19 NOTE — ED Notes (Signed)
No e-signature available for patient to sign for discharge.

## 2017-07-19 NOTE — Discharge Instructions (Signed)
Begin taking Librium as prescribed.  Do not combine this with alcohol as  the combination can be deadly.  Please follow-up with your doctor and the resources below for further help with your alcohol problem.  Please return to the emergency department if you develop any new or worsening symptoms, have any thoughts of hurting herself or anyone else, seizure, or any other concerning symptom.

## 2017-07-19 NOTE — ED Provider Notes (Signed)
[facility] EMERGENCY DEPARTMENT Provider Note   CSN: [id number] Arrival date & time: 07/19/17  0254     History   Chief Complaint Chief Complaint  Patient presents with  . Alcohol Problem  . Fall  . Mental Health Problem    HPI [name] is a 57 y.o. male with history of HIV, recent cervical fusion who presents  for alcohol detox and depression.  He reports he drank before his surgery, however he has been sad over the past couple weeks and drinking more than usual.  He reports drinking over a case a week.  He denies any other drug use.  He reports his neck symptoms are stable and improving since surgery.  He reports depression.  He does not take any medications for depression.  He denies any SI or HI, or AVH.  He has been taking his at home pain medication with good control.  He denies any fevers, chest pain, shortness of breath, abdominal pain, nausea, vomiting.  HPI  Past Medical History:  Diagnosis Date  . 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 Hemorrhoids   . HIV (human immunodeficiency virus infection) (HCC)   . Stenosis of cervical spine    withb myelopathy    Patient Active Problem List   Diagnosis Date Noted  . Dysphagia 07/08/2017  . Stenosis of cervical spine with myelopathy (HCC) 07/06/2017  . Left-sided weakness 06/27/2017  . Tricompartment osteoarthritis of right knee 03/27/2017  . Alcohol abuse with alcohol-induced mood disorder (HCC) 09/25/2016  . Polysubstance dependence (HCC) 09/25/2016  . Substance use disorder 09/19/2016  . 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 HIV disease (HCC) 11/09/2008    Past Surgical History:  Procedure Laterality Date  . ANTERIOR CERVICAL DECOMP/DISCECTOMY FUSION N/A 07/06/2017   Procedure: ANTERIOR CERVICAL DECOMPRESSION/DISCECTOMY FUSION C3-4/C4-5 2 LEVELS;  Surgeon: Lisbeth Renshaw, MD;  Location: [facility] OR;  Service: Neurosurgery;   Laterality: N/A;  . HERNIA REPAIR  97   lt ing  . RECTAL EXAM UNDER ANESTHESIA N/A 04/24/2014   Procedure:  ligation of bleeding vessels;  Surgeon: Almond Lint, MD;  Location: [facility] ORS;  Service: General;  Laterality: N/A;  . TENDON REPAIR     2006-lt index  . WART FULGURATION  07/06/2011   Procedure: FULGURATION ANAL WART;  Surgeon: Shelly Rubenstein, MD;  Location: [facility];  Service: General;  Laterality: N/A;  excision anal condyloma  . WART FULGURATION N/A 04/23/2014   Procedure: EXCISION OF ANAL CONDYLOMA;  Surgeon: Abigail Miyamoto, MD;  Location: [facility] ORS;  Service: General;  Laterality: N/A;        Home Medications    Prior to Admission medications   Medication Sig Start Date End Date Taking? Authorizing Provider  buPROPion (WELLBUTRIN XL) 300 MG 24 hr tablet Take 1 tablet (300 mg total) by mouth every morning. For depression 09/21/16  Yes Nwoko, Nicole Kindred I, NP  darunavir-cobicistat (PREZCOBIX) 800-150 MG tablet Take 1 tablet by mouth daily. Swallow whole. Do NOT crush, break or chew tablets. Take with food, take with Descovy. 03/27/17  Yes Veryl Speak, FNP  diphenhydrAMINE (BENADRYL) 50 MG tablet Take 100 mg by mouth at bedtime as needed for sleep.   Yes [provider]  emtricitabine-tenofovir AF (DESCOVY) 200-25 MG tablet Take 1 tablet by mouth daily. Take with Prezcobix. 03/27/17  Yes Veryl Speak, FNP  methocarbamol (ROBAXIN-750) 750 MG tablet Take 1 tablet (750 mg total) by mouth  3 (three) times daily as needed for muscle spasms. 07/10/17  Yes Costella, Darci Current, PA-C  oxyCODONE-acetaminophen (PERCOCET) 7.5-325 MG tablet Take 1-2 tablets by mouth every 4 (four) hours as needed for severe pain. 07/10/17  Yes Costella, Darci Current, PA-C  thiamine 100 MG tablet Take 1 tablet (100 mg total) by mouth daily. 06/30/17  Yes Elgergawy, Leana Roe, MD  acetaminophen (TYLENOL) 325 MG tablet Take 2 tablets (650 mg total) by mouth every 6 (six) hours as needed for mild  pain (or Fever >/= 101). Patient not taking: Reported on 07/08/2017 06/29/17   Elgergawy, Leana Roe, MD  chlordiazePOXIDE (LIBRIUM) 25 MG capsule 50mg  PO TID x 1D, then 25-50mg  PO BID X 1D, then 25-50mg  PO QD X 1D 07/19/17   Tosh Glaze M, PA-C  folic acid (FOLVITE) 1 MG tablet Take 1 tablet (1 mg total) by mouth daily. Patient not taking: Reported on 07/08/2017 06/30/17   Elgergawy, Leana Roe, MD  methylPREDNISolone (MEDROL) 4 MG TBPK tablet Take according to package insert Patient not taking: Reported on 07/19/2017 07/10/17   Alyson Ingles, PA-C    Family History Family History  Problem Relation Age of Onset  . Cancer Mother        brain  . Cancer Sister        breast    Social History Social History   Tobacco Use  . Smoking status: Current Every Day Smoker    Packs/day: 0.50    Years: 25.00    Pack years: 12.50    Types: Cigarettes  . Smokeless tobacco: Never Used  Substance Use Topics  . Alcohol use: Yes    Alcohol/week: 4.2 - 4.8 oz    Types: 7 - 8 Cans of beer per week    Comment: a case every 3 to 4 days  . Drug use: No     Allergies   Shellfish allergy and Sulfa antibiotics   Review of Systems Review of Systems  Constitutional: Negative for chills and fever.  HENT: Negative for facial swelling and sore throat.   Respiratory: Negative for shortness of breath.   Cardiovascular: Negative for chest pain.  Gastrointestinal: Negative for abdominal pain, nausea and vomiting.  Genitourinary: Negative for dysuria.  Musculoskeletal: Positive for neck pain. Negative for back pain.  Skin: Negative for rash and wound.  Neurological: Negative for headaches.  Psychiatric/Behavioral: Negative for hallucinations and suicidal ideas. The patient is not nervous/anxious.      Physical Exam Updated Vital Signs BP 128/85   Pulse 90   Temp 98 F (36.7 C) (Oral)   Resp 18   Ht 6\' 3"  (1.905 m)   Wt 73.9 kg (163 lb)   SpO2 96%   BMI 20.37 kg/m   Physical Exam    Constitutional: He appears well-developed and well-nourished. No distress.  HENT:  Head: Normocephalic and atraumatic.  Mouth/Throat: Oropharynx is clear and moist. No oropharyngeal exudate.  Eyes: Pupils are equal, round, and reactive to light. Conjunctivae are normal. Right eye exhibits no discharge. Left eye exhibits no discharge. No scleral icterus.  Neck: Normal range of motion. Neck supple. No thyromegaly present.  Well-healing anterior approach incision on the neck, no drainage, erythema Stable hematoma to the right posterior neck  Cardiovascular: Normal rate, regular rhythm, normal heart sounds and intact distal pulses. Exam reveals no gallop and no friction rub.  No murmur heard. Pulmonary/Chest: Effort normal and breath sounds normal. No stridor. No respiratory distress. He has no wheezes. He has no rales.  Abdominal: Soft. Bowel sounds are normal. He exhibits no distension. There is no tenderness. There is no rebound and no guarding.  Musculoskeletal: He exhibits no edema.  Lymphadenopathy:    He has no cervical adenopathy.  Neurological: He is alert. Coordination normal.  Skin: Skin is warm and dry. No rash noted. He is not diaphoretic. No pallor.  Psychiatric: His speech is normal and behavior is normal. He is not actively hallucinating. He exhibits a depressed mood. He expresses no homicidal and no suicidal ideation.  Nursing note and vitals reviewed.    ED Treatments / Results  Labs (all labs ordered are listed, but only abnormal results are displayed) Labs Reviewed  COMPREHENSIVE METABOLIC PANEL - Abnormal; Notable for the following components:      Result Value   BUN 25 (*)    Creatinine, Ser 1.46 (*)    GFR calc non Af Amer 52 (*)    All other components within normal limits  ETHANOL - Abnormal; Notable for the following components:   Alcohol, Ethyl (B) 175 (*)    All other components within normal limits  ACETAMINOPHEN LEVEL - Abnormal; Notable for the  following components:   Acetaminophen (Tylenol), Serum <10 (*)    All other components within normal limits  CBC - Abnormal; Notable for the following components:   WBC 14.6 (*)    Hemoglobin 12.8 (*)    All other components within normal limits  RAPID URINE DRUG SCREEN, HOSP PERFORMED - Abnormal; Notable for the following components:   Barbiturates   (*)    Value: Result not available. Reagent lot number recalled by manufacturer.   All other components within normal limits  SALICYLATE LEVEL    EKG None  Radiology No results found.  Procedures Procedures (including critical care time)  Medications Ordered in ED Medications  sodium chloride 0.9 % bolus 1,000 mL (0 mLs Intravenous Stopped 07/19/17 0937)  sodium chloride 0.9 % bolus 1,000 mL (0 mLs Intravenous Stopped 07/19/17 0751)  thiamine (B-1) injection 100 mg (100 mg Intravenous Given 07/19/17 0732)  oxyCODONE-acetaminophen (PERCOCET/ROXICET) 5-325 MG per tablet 2 tablet (2 tablets Oral Given 07/19/17 0943)     Initial Impression / Assessment and Plan / ED Course  I have reviewed the triage vital signs and the nursing notes.  Pertinent labs & imaging results that were available during my care of the patient were reviewed by me and considered in my medical decision making (see chart for details).     Patient presenting with depression without SI, HI, or AVH.  He has been on drinking more frequently.  He wishes to detox.  He has had no signs of DTs.  He has stable vitals.  His labs are showing mild AKI which I suspect is related to dehydration because patient has not drank much water or eating much other than alcohol the past few days.  Patient given 2 L of fluid and thiamine in the ED.  He was given a full breakfast.  He is feeling much better.  Will discharge home with Librium and patient advised not to combine this with alcohol.  Patient given outpatient resources for alcohol detox.  He is also advised to follow-up with his  PCP.  Patient plans to follow-up with his neurosurgeon at his scheduled appointment.  His neck symptoms are stable and  wound is well healing.  Return precautions discussed.  Patient understands and agrees with plan.  Patient vitals stable and discharged in satisfactory condition.  Patient also evaluated  by Dr. Donnald GarrePfeiffer who guided the patient's management and agrees with plan.  Final Clinical Impressions(s) / ED Diagnoses   Final diagnoses:  Alcohol abuse  Feeling of sadness    ED Discharge Orders        Ordered    chlordiazePOXIDE (LIBRIUM) 25 MG capsule     07/19/17 [address]1044       Eathon Valade, Ness CityAlexandra M, PA-C 07/19/17 1643    Arby BarrettePfeiffer, Marcy, MD 07/21/17 2322

## 2017-07-19 NOTE — ED Provider Notes (Signed)
Medical screening examination/treatment/procedure(s) were conducted as a shared visit with non-physician practitioner(s) and myself.  I personally evaluated the patient during the encounter.  None Patient reports he had increasing alcohol consumption.  He does have history of HIV and recent cervical fusion.  He reports he has been getting increasingly depressed and does not want to keep drinking.  Patient is alert and nontoxic.  No respiratory distress.  Heart is regular no murmur gallop.  Lungs clear.  Movements coordinated purposeful symmetric.  I agree with plan of management.   Arby BarrettePfeiffer, Clover Feehan, MD 07/21/17 2321

## 2017-07-19 NOTE — ED Notes (Signed)
Patient eating breakfast at this time.

## 2017-07-19 NOTE — ED Notes (Signed)
Patient received belongings and security items. Patient dressed. Bus pass provided to patient upon discharge.

## 2017-07-19 NOTE — ED Notes (Signed)
Breakfast tray ordered; regular diet 

## 2017-07-19 NOTE — ED Triage Notes (Signed)
Pt reports he has spinal surgery two weeks ago, "since then nothing is going right... I don't think I am suicidal...  I can't manage...  I need help w/ my drinking....  I drink too much... Need help."  Pt tearful in triage. Pt reports he has had multiple falls in the past week.

## 2017-07-19 NOTE — ED Notes (Signed)
Offered to assist pt with changing into scrubs but pt refused help stating " I will not fall and I do not need any help".

## 2017-07-31 ENCOUNTER — Other Ambulatory Visit: Payer: Self-pay

## 2017-07-31 ENCOUNTER — Emergency Department (HOSPITAL_COMMUNITY)
Admission: EM | Admit: 2017-07-31 | Discharge: 2017-08-01 | Disposition: A | Discharge status: Other Acute Inpatient Hospital | Payer: Medicare Other | Attending: Emergency Medicine | Admitting: Emergency Medicine

## 2017-07-31 DIAGNOSIS — F17210 Nicotine dependence, cigarettes, uncomplicated: Secondary | ICD-10-CM | POA: Insufficient documentation

## 2017-07-31 DIAGNOSIS — F332 Major depressive disorder, recurrent severe without psychotic features: Secondary | ICD-10-CM | POA: Diagnosis present

## 2017-07-31 DIAGNOSIS — Z049 Encounter for examination and observation for unspecified reason: Secondary | ICD-10-CM

## 2017-07-31 DIAGNOSIS — R45851 Suicidal ideations: Secondary | ICD-10-CM | POA: Insufficient documentation

## 2017-07-31 DIAGNOSIS — Z79899 Other long term (current) drug therapy: Secondary | ICD-10-CM | POA: Insufficient documentation

## 2017-07-31 DIAGNOSIS — B20 Human immunodeficiency virus [HIV] disease: Secondary | ICD-10-CM | POA: Insufficient documentation

## 2017-07-31 LAB — COMPREHENSIVE METABOLIC PANEL
ALBUMIN: 4.2 g/dL (ref 3.5–5.0)
ALK PHOS: 44 U/L (ref 38–126)
ALT: 23 U/L (ref 0–44)
AST: 29 U/L (ref 15–41)
Anion gap: 9 (ref 5–15)
BUN: 13 mg/dL (ref 6–20)
CALCIUM: 9.2 mg/dL (ref 8.9–10.3)
CO2: 25 mmol/L (ref 22–32)
CREATININE: 1.06 mg/dL (ref 0.61–1.24)
Chloride: 102 mmol/L (ref 98–111)
GFR calc non Af Amer: >60 mL/min (ref >60)
GLUCOSE: 84 mg/dL (ref 70–99)
Potassium: 3.8 mmol/L (ref 3.5–5.1)
SODIUM: 136 mmol/L (ref 135–145)
Total Bilirubin: 1.3 mg/dL — ABNORMAL HIGH (ref 0.3–1.2)
Total Protein: 7.5 g/dL (ref 6.5–8.1)

## 2017-07-31 LAB — CBC
HEMATOCRIT: 37.8 % — AB (ref 39.0–52.0)
Hemoglobin: 12.6 g/dL — ABNORMAL LOW (ref 13.0–17.0)
MCH: 28.7 pg (ref 26.0–34.0)
MCHC: 33.3 g/dL (ref 30.0–36.0)
MCV: 86.1 fL (ref 78.0–100.0)
Platelets: 259 10*3/uL (ref 150–400)
RBC: 4.39 MIL/uL (ref 4.22–5.81)
RDW: 15.4 % (ref 11.5–15.5)
WBC: 5.5 10*3/uL (ref 4.0–10.5)

## 2017-07-31 LAB — RAPID URINE DRUG SCREEN, HOSP PERFORMED
Amphetamines: NOT DETECTED
BARBITURATES: NOT DETECTED
BENZODIAZEPINES: NOT DETECTED
Cocaine: NOT DETECTED
Opiates: NOT DETECTED
Tetrahydrocannabinol: NOT DETECTED

## 2017-07-31 LAB — ETHANOL: Alcohol, Ethyl (B): 173 mg/dL — ABNORMAL HIGH (ref <10)

## 2017-07-31 MED ORDER — DARUNAVIR-COBICISTAT 800-150 MG PO TABS
1.0000 | ORAL_TABLET | Freq: Every day | ORAL | Status: DC
  Administered 2017-07-31: 1 via ORAL
  Filled 2017-07-31: qty 1

## 2017-07-31 MED ORDER — LORAZEPAM 1 MG PO TABS: 0.0000 mg | ORAL_TABLET | Freq: Two times a day (BID) | ORAL | Status: DC

## 2017-07-31 MED ORDER — EMTRICITABINE-TENOFOVIR AF 200-25 MG PO TABS
1.0000 | ORAL_TABLET | Freq: Every day | ORAL | Status: DC
  Administered 2017-07-31 – 2017-08-01 (×2): 1 via ORAL
  Filled 2017-07-31 (×2): qty 1

## 2017-07-31 MED ORDER — LORAZEPAM 2 MG/ML IJ SOLN: 0.0000 mg | Freq: Four times a day (QID) | INTRAMUSCULAR | Status: DC

## 2017-07-31 MED ORDER — THIAMINE HCL 100 MG/ML IJ SOLN: 100.0000 mg | Freq: Every day | INTRAMUSCULAR | Status: DC

## 2017-07-31 MED ORDER — OXYCODONE-ACETAMINOPHEN 7.5-325 MG PO TABS
1.0000 | ORAL_TABLET | Freq: Three times a day (TID) | ORAL | Status: DC | PRN
  Administered 2017-07-31 – 2017-08-01 (×3): 1 via ORAL
  Filled 2017-07-31 (×3): qty 1

## 2017-07-31 MED ORDER — DARUNAVIR-COBICISTAT 800-150 MG PO TABS
1.0000 | ORAL_TABLET | Freq: Every day | ORAL | Status: DC
  Administered 2017-08-01: 1 via ORAL
  Filled 2017-07-31: qty 1

## 2017-07-31 MED ORDER — IBUPROFEN 800 MG PO TABS
800.0000 mg | ORAL_TABLET | Freq: Four times a day (QID) | ORAL | Status: DC | PRN
  Administered 2017-08-01: 800 mg via ORAL
  Filled 2017-07-31: qty 1

## 2017-07-31 MED ORDER — LORAZEPAM 2 MG/ML IJ SOLN: 0.0000 mg | Freq: Two times a day (BID) | INTRAMUSCULAR | Status: DC

## 2017-07-31 MED ORDER — DIPHENHYDRAMINE HCL 25 MG PO CAPS
50.0000 mg | ORAL_CAPSULE | Freq: Every evening | ORAL | Status: DC | PRN
  Administered 2017-08-01: 50 mg via ORAL
  Filled 2017-07-31: qty 2

## 2017-07-31 MED ORDER — BUPROPION HCL ER (XL) 150 MG PO TB24
150.0000 mg | ORAL_TABLET | Freq: Every day | ORAL | Status: DC
  Administered 2017-07-31 – 2017-08-01 (×2): 150 mg via ORAL
  Filled 2017-07-31 (×2): qty 1

## 2017-07-31 MED ORDER — ACETAMINOPHEN 325 MG PO TABS
650.0000 mg | ORAL_TABLET | ORAL | Status: DC | PRN
  Administered 2017-07-31: 650 mg via ORAL
  Filled 2017-07-31: qty 2

## 2017-07-31 MED ORDER — IBUPROFEN 200 MG PO TABS: 400.0000 mg | ORAL_TABLET | Freq: Four times a day (QID) | ORAL | Status: DC | PRN

## 2017-07-31 MED ORDER — VITAMIN B-1 100 MG PO TABS
100.0000 mg | ORAL_TABLET | Freq: Every day | ORAL | Status: DC
  Administered 2017-07-31 – 2017-08-01 (×2): 100 mg via ORAL
  Filled 2017-07-31 (×2): qty 1

## 2017-07-31 MED ORDER — OXYCODONE-ACETAMINOPHEN 7.5-325 MG PO TABS: 1.0000 | ORAL_TABLET | Freq: Four times a day (QID) | ORAL | Status: DC | PRN

## 2017-07-31 MED ORDER — LORAZEPAM 1 MG PO TABS
0.0000 mg | ORAL_TABLET | Freq: Four times a day (QID) | ORAL | Status: DC
  Administered 2017-07-31 (×2): 1 mg via ORAL
  Administered 2017-07-31 – 2017-08-01 (×2): 2 mg via ORAL
  Administered 2017-08-01: 1 mg via ORAL
  Filled 2017-07-31: qty 1
  Filled 2017-07-31: qty 2
  Filled 2017-07-31: qty 1
  Filled 2017-07-31: qty 2
  Filled 2017-07-31: qty 1

## 2017-07-31 NOTE — ED Notes (Signed)
Pt requesting oxycodone for pain

## 2017-07-31 NOTE — ED Notes (Signed)
Pt watching T.V. state he has had a difficult surgery recovery, which has made him depressed. He is alert and cooperative, I will continue to monitor.

## 2017-07-31 NOTE — Consult Note (Deleted)
[facility]  07/31/2017 12:34 PM [name]  MRN:  [id number] Principal Problem: <principal problem not specified> Discharge Diagnoses:  Patient Active Problem List   Diagnosis Date Noted  . Dysphagia [R13.10] 07/08/2017  . 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 Left-sided weakness [R53.1] 06/27/2017  . Tricompartment osteoarthritis of right knee [M17.11] 03/27/2017  . Alcohol abuse with alcohol-induced mood disorder (HCC) [F10.14] 09/25/2016  . Polysubstance dependence (HCC) [F19.20] 09/25/2016  . Substance use disorder [F19.90] 09/19/2016  . MDD (major depressive disorder), recurrent severe, without psychosis (HCC) [F33.2] 09/18/2016  . 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 HIV disease (HCC) [B20] 11/09/2008   ID: Lives alone, and is dating with no children. He is disabled since 2004 for HIV.   Subjective: Depression and Suicidal for about a week. He reports some symptoms of anhedonia, decreased sleep and appetite, suicidal thoughts. He states since his cervical fusion surgery last month he has developed new problems that limited his ADLs. He reports new onset of numbness, balance and unsteady gait, decreased mobility, tingling of upper extremities. He has been diagnosed with MDD, and managed with Wellbutrin XL. He states he discontinued the medications 4 weeks ago because he was feeling better. " I realize that was the wrong thing to do." He states he has been on Wellbutrin up until about 4 weeks ago when he discontinued it as he was feeling better. He endorses drinking about a case of ETOH daily.   Total Time spent with patient: 30 minutes  Past Psychiatric History: MDD Inpatient: 4- 5x [facility] for depression and suicidal attempts.  Outpatient : None Suicide Attempts: x1 1990s, hanging with a belt.   Family Hx: Alcohol abuse.  Past Medical History:  Past Medical History:  Diagnosis Date  . 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 Hemorrhoids    . HIV (human immunodeficiency virus infection) (HCC)   . Stenosis of cervical spine    withb myelopathy   Past Surgical History:  Procedure Laterality Date  . ANTERIOR CERVICAL DECOMP/DISCECTOMY FUSION N/A 07/06/2017   Procedure: ANTERIOR CERVICAL DECOMPRESSION/DISCECTOMY FUSION C3-4/C4-5 2 LEVELS;  Surgeon: Lisbeth RenshawNundkumar, Neelesh, MD;  Location: [facility];  Service: Neurosurgery;  Laterality: N/A;  . HERNIA REPAIR  97   lt ing  . RECTAL EXAM UNDER ANESTHESIA N/A 04/24/2014   Procedure:  ligation of bleeding vessels;  Surgeon: Almond LintFaera Byerly, MD;  Location: [facility]S;  Service: General;  Laterality: N/A;  . TENDON REPAIR     2006-lt index  . WART FULGURATION  07/06/2011   Procedure: FULGURATION ANAL WART;  Surgeon: Shelly Rubensteinouglas A Blackman, MD;  Location:  SURGERY CENTER;  Service: General;  Laterality: N/A;  excision anal condyloma  . WART FULGURATION N/A 04/23/2014   Procedure: EXCISION OF ANAL CONDYLOMA;  Surgeon: Abigail Miyamotoouglas Blackman, MD;  Location: [facility];  Service: General;  Laterality: N/A;   Family History:  Family History  Problem Relation Age of Onset  . Cancer Mother        brain  . Cancer Sister        breast    Social History:  Social History   Substance and Sexual Activity  Alcohol Use Yes  . Alcohol/week: 4.2 - 4.8 oz  . Types: 7 - 8 Cans of beer per week   Comment: a case every 3 to 4 days    Social History   Substance and Sexual Activity  Drug Use No   Social History   Socioeconomic History  .  Marital status: Single    Spouse name: Not on file  . Number of children: Not on file  . Years of education: Not on file  . Highest education level: Not on file  Occupational History  . Not on file  Social Needs  . Financial resource strain: Not on file  . Food insecurity:    Worry: Not on file    Inability: Not on file  . Transportation needs:    Medical: Not on file    Non-medical: Not on file  Tobacco Use  . Smoking status: Current Every Day Smoker    Packs/day:  0.50    Years: 25.00    Pack years: 12.50    Types: Cigarettes  . Smokeless tobacco: Never Used  Substance and Sexual Activity  . Alcohol use: Yes    Alcohol/week: 4.2 - 4.8 oz    Types: 7 - 8 Cans of beer per week    Comment: a case every 3 to 4 days  . Drug use: No  . Sexual activity: Never  Lifestyle  . Physical activity:    Days per week: Not on file    Minutes per session: Not on file  . Stress: Not on file  Relationships  . Social connections:    Talks on phone: Not on file    Gets together: Not on file    Attends religious service: Not on file    Active member of club or organization: Not on file    Attends meetings of clubs or organizations: Not on file    Relationship status: Not on file  Other Topics Concern  . Not on file  Social History Narrative  . Not on file    Has this patient used any form of tobacco in the last 30 days? (Cigarettes, Smokeless Tobacco, Cigars, and/or Pipes) A prescription for an FDA-approved tobacco cessation medication was offered at discharge and the patient refused  Current Medications: Current Facility-Administered Medications  Medication Dose Route Frequency Provider Last Rate Last Dose  . acetaminophen (TYLENOL) tablet 650 mg  650 mg Oral Q4H PRN Mancel Bale, MD   650 mg at 07/31/17 1025  . darunavir-cobicistat (PREZCOBIX) 800-150 MG per tablet 1 tablet  1 tablet Oral Daily Khatri, Hina, PA-C   1 tablet at 07/31/17 1024  . diphenhydrAMINE (BENADRYL) capsule 50 mg  50 mg Oral QHS PRN Khatri, Hina, PA-C      . emtricitabine-tenofovir AF (DESCOVY) 200-25 MG per tablet 1 tablet  1 tablet Oral Daily Khatri, Hina, PA-C   1 tablet at 07/31/17 1025  . ibuprofen (ADVIL,MOTRIN) tablet 800 mg  800 mg Oral Q6H PRN Khatri, Hina, PA-C      . LORazepam (ATIVAN) injection 0-4 mg  0-4 mg Intravenous Q6H Khatri, Hina, PA-C       Or  . LORazepam (ATIVAN) tablet 0-4 mg  0-4 mg Oral Q6H Khatri, Hina, PA-C   1 mg at 07/31/17 1025  . [START ON  08/02/2017] LORazepam (ATIVAN) injection 0-4 mg  0-4 mg Intravenous Q12H Khatri, Hina, PA-C       Or  . [START ON 08/02/2017] LORazepam (ATIVAN) tablet 0-4 mg  0-4 mg Oral Q12H Khatri, Hina, PA-C      . oxyCODONE-acetaminophen (PERCOCET) 7.5-325 MG per tablet 1 tablet  1 tablet Oral Q8H PRN Cherly Beach, DO      . thiamine (VITAMIN B-1) tablet 100 mg  100 mg Oral Daily Khatri, Hina, PA-C   100 mg at 07/31/17 1025  Or  . thiamine (B-1) injection 100 mg  100 mg Intravenous Daily Khatri, Hina, PA-C       Current Outpatient Medications  Medication Sig Dispense Refill  . buPROPion (WELLBUTRIN XL) 300 MG 24 hr tablet Take 1 tablet (300 mg total) by mouth every morning. For depression 10 tablet 0  . darunavir-cobicistat (PREZCOBIX) 800-150 MG tablet Take 1 tablet by mouth daily. Swallow whole. Do NOT crush, break or chew tablets. Take with food, take with Descovy. 30 tablet 5  . diphenhydrAMINE (BENADRYL) 50 MG tablet Take 100 mg by mouth at bedtime as needed for sleep.    Marland Kitchen emtricitabine-tenofovir AF (DESCOVY) 200-25 MG tablet Take 1 tablet by mouth daily. Take with Prezcobix. 30 tablet 5  . ibuprofen (ADVIL,MOTRIN) 200 MG tablet Take 800 mg by mouth every 6 (six) hours as needed for moderate pain.    Marland Kitchen oxyCODONE-acetaminophen (PERCOCET) 7.5-325 MG tablet Take 1-2 tablets by mouth every 4 (four) hours as needed for severe pain. 60 tablet 0  . acetaminophen (TYLENOL) 325 MG tablet Take 2 tablets (650 mg total) by mouth every 6 (six) hours as needed for mild pain (or Fever >/= 101). (Patient not taking: Reported on 07/08/2017)    . chlordiazePOXIDE (LIBRIUM) 25 MG capsule 50mg  PO TID x 1D, then 25-50mg  PO BID X 1D, then 25-50mg  PO QD X 1D (Patient not taking: Reported on 07/31/2017) 10 capsule 0  . folic acid (FOLVITE) 1 MG tablet Take 1 tablet (1 mg total) by mouth daily. (Patient not taking: Reported on 07/08/2017) 30 tablet 0  . methocarbamol (ROBAXIN-750) 750 MG tablet Take 1 tablet (750 mg total)  by mouth 3 (three) times daily as needed for muscle spasms. (Patient not taking: Reported on 07/31/2017) 90 tablet 1  . methylPREDNISolone (MEDROL) 4 MG TBPK tablet Take according to package insert (Patient not taking: Reported on 07/19/2017) 21 tablet 0  . thiamine 100 MG tablet Take 1 tablet (100 mg total) by mouth daily. (Patient not taking: Reported on 07/31/2017) 30 tablet 0   PTA Medications:  (Not in a hospital admission)  Musculoskeletal: Strength & Muscle Tone: within normal limits Gait & Station: normal Patient leans: N/A  Psychiatric Specialty Exam: Physical Exam  Nursing note and vitals reviewed. Constitutional: He is oriented to person, place, and time. He appears well-developed and well-nourished.  HENT:  Head: Normocephalic.  Neurological: He is alert and oriented to person, place, and time.  Skin: Skin is warm.  Psychiatric:  Depressed and suicidal ideations    Review of Systems  Eyes: Negative for blurred vision, double vision and photophobia.  Gastrointestinal: Negative for heartburn, nausea and vomiting.  Musculoskeletal: Positive for neck pain.       Decreased ROM, unsteady gait.   Neurological: Positive for tingling and weakness. Negative for dizziness and headaches.  Psychiatric/Behavioral: Positive for depression, substance abuse and suicidal ideas. The patient is nervous/anxious and has insomnia.   All other systems reviewed and are negative.   Blood pressure 121/85, pulse 80, temperature 98.7 F (37.1 C), temperature source Oral, resp. rate 16, SpO2 98 %.There is no height or weight on file to calculate BMI.  General Appearance: Fairly Groomed  Eye Contact:  Fair  Speech:  Clear and Coherent and Normal Rate  Volume:  Decreased  Mood:  Anxious and Depressed  Affect:  Blunt, Depressed and Flat  Thought Process:  Coherent, Linear and Descriptions of Associations: Intact  Orientation:  Full (Time, Place, and Person)  Thought Content:  Logical  Suicidal  Thoughts:  Yes.  with intent/plan  Homicidal Thoughts:  No  Memory:  Immediate;   Fair Recent;   Fair  Judgement:  Poor  Insight:  Present and Shallow  Psychomotor Activity:  Psychomotor Retardation  Concentration:  Concentration: Fair and Attention Span: Fair  Recall:  [employer] of Knowledge:  Fair  Language:  Fair  Akathisia:  No  Handed:  Right  AIMS (if indicated):     Assets:  Communication Skills Desire for Improvement Financial Resources/Insurance Housing Vocational/Educational  ADL's:  Intact  Cognition:  WNL  Sleep:        Demographic Factors:  Male, Low socioeconomic status, Living alone and Unemployed  Loss Factors: Decline in physical health  Historical Factors: Prior suicide attempts, Family history of mental illness or substance abuse and Impulsivity  Risk Reduction Factors:   NA  Continued Clinical Symptoms:  Depression:   Anhedonia Comorbid alcohol abuse/dependence Hopelessness Impulsivity Insomnia Alcohol/Substance Abuse/Dependencies Chronic Pain Previous Psychiatric Diagnoses and Treatments Medical Diagnoses and Treatments/Surgeries  Cognitive Features That Contribute To Risk:  None    Suicide Risk:  Severe:  Frequent, intense, and enduring suicidal ideation, specific plan, no subjective intent, but some objective markers of intent (i.e., choice of lethal method), the method is accessible, some limited preparatory behavior, evidence of impaired self-control, severe dysphoria/symptomatology, multiple risk factors present, and few if any protective factors, particularly a lack of social support.    Plan Of Care/Follow-up recommendations:  Activity:  Increase activity as tolerated Diet:  Routine house diet Tests:  Routine testing as directed by inpatient psychiatry Other:  Even if you begin to feel better continue taking your medications.   Disposition: Recommend inpatient at this time. [facility] at capacity will refer to outside  facilities.  Truman Hayward, FNP 07/31/2017, 12:34 PM    Addendum: This note was completed in error and will be deleted.   Juanetta Beets, DO 07/31/17 6:52 PM

## 2017-07-31 NOTE — BH Assessment (Signed)
[facility]  Per Juanetta BeetsJacqueline Norman, DO, this voluntary pt requires psychiatric hospitalization at this time.  The following facilities have been contacted to seek placement for this pt, with results as noted:  Beds available, information sent, decision pending[facility]   [facility]  [facility]   Daleisa Halperin, [place] Behavioral Health Coordinator [phone]

## 2017-07-31 NOTE — ED Notes (Signed)
Pt c/o SI without a plan; pt denies AH/VH

## 2017-07-31 NOTE — ED Provider Notes (Signed)
Patient is requesting Oxycodone for pain. He is not currently receiving long-term narcotic treatment. Will order non-narcotic pain medication.   Mancel BaleWentz, Jaecion Dempster, MD 07/31/17 [phone]

## 2017-07-31 NOTE — ED Provider Notes (Signed)
[facility]-EMERGENCY DEPT Provider Note   CSN: [id number] Arrival date & time: 07/31/17  0108     History   Chief Complaint Chief Complaint  Patient presents with  . Depression    HPI [name] is a 57 y.o. male with a past medical history of HIV, status post C3-C4-C5 discectomy on June 28, who presents to ED for evaluation of suicidal ideations.  He states that after the surgery, he has been less independent in his ADLs.  This has worsened his depression.  States that he has been suffering from depression since the age of 57.  He does endorse daily alcohol use since the age of 57.  States that last drink was approximately 3 hours ago.  He does report prior suicide attempts in 1997 in 1998 by hanging himself.  Denies any HI, AVH, history of ETOH withdrawal seizures, tremors, chest pain. Admits he has not been taking his Wellbutrin.  HPI  Past Medical History:  Diagnosis Date  . 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 Hemorrhoids   . HIV (human immunodeficiency virus infection) (HCC)   . Stenosis of cervical spine    withb myelopathy    Patient Active Problem List   Diagnosis Date Noted  . Dysphagia 07/08/2017  . Stenosis of cervical spine with myelopathy (HCC) 07/06/2017  . Left-sided weakness 06/27/2017  . Tricompartment osteoarthritis of right knee 03/27/2017  . Alcohol abuse with alcohol-induced mood disorder (HCC) 09/25/2016  . Polysubstance dependence (HCC) 09/25/2016  . Substance use disorder 09/19/2016  . 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 HIV disease (HCC) 11/09/2008    Past Surgical History:  Procedure Laterality Date  . ANTERIOR CERVICAL DECOMP/DISCECTOMY FUSION N/A 07/06/2017   Procedure: ANTERIOR CERVICAL DECOMPRESSION/DISCECTOMY FUSION C3-4/C4-5 2 LEVELS;  Surgeon: Lisbeth RenshawNundkumar, Neelesh, MD;  Location: [facility];  Service: Neurosurgery;  Laterality: N/A;  . HERNIA  REPAIR  97   lt ing  . RECTAL EXAM UNDER ANESTHESIA N/A 04/24/2014   Procedure:  ligation of bleeding vessels;  Surgeon: Almond LintFaera Byerly, MD;  Location: [facility];  Service: General;  Laterality: N/A;  . TENDON REPAIR     2006-lt index  . WART FULGURATION  07/06/2011   Procedure: FULGURATION ANAL WART;  Surgeon: Shelly Rubensteinouglas A Blackman, MD;  Location: [facility];  Service: General;  Laterality: N/A;  excision anal condyloma  . WART FULGURATION N/A 04/23/2014   Procedure: EXCISION OF ANAL CONDYLOMA;  Surgeon: Abigail Miyamotoouglas Blackman, MD;  Location: [facility];  Service: General;  Laterality: N/A;        Home Medications    Prior to Admission medications   Medication Sig Start Date End Date Taking? Authorizing Provider  buPROPion (WELLBUTRIN XL) 300 MG 24 hr tablet Take 1 tablet (300 mg total) by mouth every morning. For depression 09/21/16  Yes Nwoko, Nicole KindredAgnes I, NP  darunavir-cobicistat (PREZCOBIX) 800-150 MG tablet Take 1 tablet by mouth daily. Swallow whole. Do NOT crush, break or chew tablets. Take with food, take with Descovy. 03/27/17  Yes Veryl Speakalone, Gregory D, FNP  diphenhydrAMINE (BENADRYL) 50 MG tablet Take 100 mg by mouth at bedtime as needed for sleep.   Yes [provider]  emtricitabine-tenofovir AF (DESCOVY) 200-25 MG tablet Take 1 tablet by mouth daily. Take with Prezcobix. 03/27/17  Yes Veryl Speakalone, Gregory D, FNP  ibuprofen (ADVIL,MOTRIN) 200 MG tablet Take 800 mg by mouth every 6 (six) hours as needed for moderate pain.   Yes [provider]  oxyCODONE-acetaminophen (PERCOCET) 7.5-325 MG tablet Take 1-2 tablets by mouth every 4 (four) hours as needed for severe pain. 07/10/17  Yes Costella, Darci Current, PA-C  acetaminophen (TYLENOL) 325 MG tablet Take 2 tablets (650 mg total) by mouth every 6 (six) hours as needed for mild pain (or Fever >/= 101). Patient not taking: Reported on 07/08/2017 06/29/17   Elgergawy, Leana Roe, MD  chlordiazePOXIDE (LIBRIUM) 25 MG capsule 50mg  PO TID x 1D,  then 25-50mg  PO BID X 1D, then 25-50mg  PO QD X 1D Patient not taking: Reported on 07/31/2017 07/19/17   Emi Holes, PA-C  folic acid (FOLVITE) 1 MG tablet Take 1 tablet (1 mg total) by mouth daily. Patient not taking: Reported on 07/08/2017 06/30/17   Elgergawy, Leana Roe, MD  methocarbamol (ROBAXIN-750) 750 MG tablet Take 1 tablet (750 mg total) by mouth 3 (three) times daily as needed for muscle spasms. Patient not taking: Reported on 07/31/2017 07/10/17   Alyson Ingles, PA-C  methylPREDNISolone (MEDROL) 4 MG TBPK tablet Take according to package insert Patient not taking: Reported on 07/19/2017 07/10/17   Alyson Ingles, PA-C  thiamine 100 MG tablet Take 1 tablet (100 mg total) by mouth daily. Patient not taking: Reported on 07/31/2017 06/30/17   Elgergawy, Leana Roe, MD    Family History Family History  Problem Relation Age of Onset  . Cancer Mother        brain  . Cancer Sister        breast    Social History Social History   Tobacco Use  . Smoking status: Current Every Day Smoker    Packs/day: 0.50    Years: 25.00    Pack years: 12.50    Types: Cigarettes  . Smokeless tobacco: Never Used  Substance Use Topics  . Alcohol use: Yes    Alcohol/week: 4.2 - 4.8 oz    Types: 7 - 8 Cans of beer per week    Comment: a case every 3 to 4 days  . Drug use: No     Allergies   Shellfish allergy and Sulfa antibiotics   Review of Systems Review of Systems  Constitutional: Negative for appetite change, chills and fever.  HENT: Negative for ear pain, rhinorrhea, sneezing and sore throat.   Eyes: Negative for photophobia and visual disturbance.  Respiratory: Negative for cough, chest tightness, shortness of breath and wheezing.   Cardiovascular: Negative for chest pain and palpitations.  Gastrointestinal: Negative for abdominal pain, blood in stool, constipation, diarrhea, nausea and vomiting.  Genitourinary: Negative for dysuria, hematuria and urgency.  Musculoskeletal:  Negative for myalgias.  Skin: Negative for rash.  Neurological: Negative for dizziness, weakness and light-headedness.  Psychiatric/Behavioral: Positive for dysphoric mood and suicidal ideas.     Physical Exam Updated Vital Signs BP (!) 138/97 (BP Location: Right Arm)   Pulse 83   Temp 98.8 F (37.1 C) (Oral)   Resp 20   SpO2 100%   Physical Exam  Constitutional: He appears well-developed and well-nourished. No distress.  HENT:  Head: Normocephalic and atraumatic.  Nose: Nose normal.  Eyes: Conjunctivae and EOM are normal. Left eye exhibits no discharge. No scleral icterus.  Neck: Normal range of motion. Neck supple.  Cardiovascular: Normal rate, regular rhythm, normal heart sounds and intact distal pulses. Exam reveals no gallop and no friction rub.  No murmur heard. Pulmonary/Chest: Effort normal and breath sounds normal. No respiratory distress.  Abdominal: Soft. Bowel sounds are normal. He exhibits no distension.  There is no tenderness. There is no guarding.  Musculoskeletal: Normal range of motion. He exhibits no edema.  Neurological: He is alert. He exhibits normal muscle tone. Coordination normal.  Skin: Skin is warm and dry. No rash noted.  Psychiatric: He exhibits a depressed mood. He expresses suicidal ideation. He expresses no homicidal ideation. He expresses no suicidal plans and no homicidal plans.  Nursing note and vitals reviewed.    ED Treatments / Results  Labs (all labs ordered are listed, but only abnormal results are displayed) Labs Reviewed  COMPREHENSIVE METABOLIC PANEL - Abnormal; Notable for the following components:      Result Value   Total Bilirubin 1.3 (*)    All other components within normal limits  ETHANOL - Abnormal; Notable for the following components:   Alcohol, Ethyl (B) 173 (*)    All other components within normal limits  CBC - Abnormal; Notable for the following components:   Hemoglobin 12.6 (*)    HCT 37.8 (*)    All other  components within normal limits  RAPID URINE DRUG SCREEN, HOSP PERFORMED    EKG None  Radiology No results found.  Procedures Procedures (including critical care time)  Medications Ordered in ED Medications  LORazepam (ATIVAN) injection 0-4 mg (0 mg Intravenous Not Given 07/31/17 0306)    Or  LORazepam (ATIVAN) tablet 0-4 mg ( Oral See Alternative 07/31/17 0306)  LORazepam (ATIVAN) injection 0-4 mg (has no administration in time range)    Or  LORazepam (ATIVAN) tablet 0-4 mg (has no administration in time range)  thiamine (VITAMIN B-1) tablet 100 mg (has no administration in time range)    Or  thiamine (B-1) injection 100 mg (has no administration in time range)     Initial Impression / Assessment and Plan / ED Course  I have reviewed the triage vital signs and the nursing notes.  Pertinent labs & imaging results that were available during my care of the patient were reviewed by me and considered in my medical decision making (see chart for details).     57 year old male with past medical history of HIV, status post C3-C5 discectomy done on June 28, who presents to ED for evaluation of depression and suicidal ideations.  He also endorses daily EtOH use.  Last use being 3 hours ago.  He expresses suicidal ideations based on his frustration of inability to perform ADLs after his surgery.  No specific plan in place.  Lab work significant for EtOH level of 173.  Will initiate CIWA protocol and consult TTS.  He is medically cleared for TTS evaluation.  Portions of this note were generated with Scientist, clinical (histocompatibility and immunogenetics). Dictation errors may occur despite best attempts at proofreading.   Final Clinical Impressions(s) / ED Diagnoses   Final diagnoses:  None    ED Discharge Orders    None       Dietrich Pates, PA-C 07/31/17 0345    Zadie Rhine, MD 07/31/17 [phone]

## 2017-07-31 NOTE — ED Notes (Signed)
ED Provider at bedside. 

## 2017-07-31 NOTE — BH Assessment (Signed)
[facility] Assessment Progress Note     Per Dr, Sharma [name], patient meets inpatient admission criteria and [facility].

## 2017-07-31 NOTE — ED Triage Notes (Addendum)
Patient stated he had cervical spine surgery 3 weeks ago and he was been depressed about not being independent and unable to do everything on his own. Patient stated his been drinking alcohol to help him sleep and forget about what happened to him. Pt stated  "id rather die than living like this.

## 2017-07-31 NOTE — ED Notes (Signed)
Psych Provider at bedside. 

## 2017-07-31 NOTE — BH Assessment (Addendum)
Assessment Note  [name] is an 57 y.o. male.  The pt came in due to having suicidal thoughts.  He denied having a plan of how he would kill himself.  He stated his main stressor is back pain due to having back surgery June 28.  The pt has a history of trying to hang himself in 1997 and 1998.  The pt stated he would not feel safe going back home.  The pt is currently not seeing a counselor or psychiatrist.  He was last inpatient at [facility] 09/2016 and 02/2009 for SI and alcohol use.  The pt denies any history or current self harm and HI.  The pt denies any recent legal problems.  He denies any abuse and hallucinations.  The pt stated he is sleeping about 5 hours a night and has a poor appetite.  He reported he often feels hopeless, has little interest in pleasurable things, and has problems concentrating.  The pt drinks alcohol daily and drinks about a case of beer a day.  He last had alcohol a few hours before coming to the hospital.  The pt's blood alcohol level is 173.  The pt's UDS is negative for all substances.  Pt is dressed in scrubs. He is drowsy and oriented x4. Pt speaks in a soft tone, at moderate volume and normal pace. Eye contact is fair. Pt's mood is depressed. Thought process is coherent and relevant. There is no indication Pt is currently responding to internal stimuli or experiencing delusional thought content.?Pt was cooperative throughout assessment.    Diagnosis: F33.2 Major depressive disorder, Recurrent episode, Severe F10.20 Alcohol use disorder, Severe   Past Medical History:  Past Medical History:  Diagnosis Date  . 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 Hemorrhoids   . HIV (human immunodeficiency virus infection) (HCC)   . Stenosis of cervical spine    withb myelopathy    Past Surgical History:  Procedure Laterality Date  . ANTERIOR CERVICAL DECOMP/DISCECTOMY FUSION N/A 07/06/2017   Procedure: ANTERIOR CERVICAL DECOMPRESSION/DISCECTOMY FUSION C3-4/C4-5 2 LEVELS;   Surgeon: Lisbeth Renshaw, MD;  Location: [facility] OR;  Service: Neurosurgery;  Laterality: N/A;  . HERNIA REPAIR  97   lt ing  . RECTAL EXAM UNDER ANESTHESIA N/A 04/24/2014   Procedure:  ligation of bleeding vessels;  Surgeon: Almond Lint, MD;  Location: [facility] ORS;  Service: General;  Laterality: N/A;  . TENDON REPAIR     2006-lt index  . WART FULGURATION  07/06/2011   Procedure: FULGURATION ANAL WART;  Surgeon: Shelly Rubenstein, MD;  Location: [facility];  Service: General;  Laterality: N/A;  excision anal condyloma  . WART FULGURATION N/A 04/23/2014   Procedure: EXCISION OF ANAL CONDYLOMA;  Surgeon: Abigail Miyamoto, MD;  Location: [facility] ORS;  Service: General;  Laterality: N/A;    Family History:  Family History  Problem Relation Age of Onset  . Cancer Mother        brain  . Cancer Sister        breast    Social History:  reports that he has been smoking cigarettes.  He has a 12.50 pack-year smoking history. He has never used smokeless tobacco. He reports that he drinks about 4.2 - 4.8 oz of alcohol per week. He reports that he does not use drugs.  Additional Social History:  Alcohol / Drug Use Pain Medications: See MAR Prescriptions: See MAR Over the Counter: See MAR History of alcohol / drug use?: Yes Longest period of sobriety (when/how  long): unknown Substance #1 Name of Substance 1: alcohol 1 - Amount (size/oz): 12 beers  1 - Frequency: daily 1 - Duration: unknown 1 - Last Use / Amount: 07/30/2017  CIWA: CIWA-Ar BP: (!) 138/97 Pulse Rate: 83 Nausea and Vomiting: no nausea and no vomiting Tactile Disturbances: none Tremor: not visible, but can be felt fingertip to fingertip Auditory Disturbances: not present Paroxysmal Sweats: barely perceptible sweating, palms moist Visual Disturbances: not present Anxiety: no anxiety, at ease Headache, Fullness in Head: very mild Agitation: normal activity Orientation and Clouding of Sensorium: oriented and can do serial  additions CIWA-Ar Total: 3 COWS:    Allergies:  Allergies  Allergen Reactions  . Shellfish Allergy Anaphylaxis and Swelling    Swelling everywhere  . Sulfa Antibiotics Anaphylaxis, Hives, Itching and Swelling    Swelling all over     Home Medications:  (Not in a hospital admission)  OB/GYN Status:  No LMP for male patient.  General Assessment Data Location of Assessment: [facility] ED TTS Assessment: In system Is this a Tele or Face-to-Face Assessment?: Face-to-Face Is this an Initial Assessment or a Re-assessment for this encounter?: Initial Assessment Marital status: Single Maiden name: NA Is patient pregnant?: Other (Comment)(Male) Living Arrangements: Alone Can pt return to current living arrangement?: Yes Admission Status: Voluntary Is patient capable of signing voluntary admission?: Yes Referral Source: Self/Family/Friend Insurance type: Medicare     Crisis Care Plan Living Arrangements: Alone Legal Guardian: Other:(Self) Name of Psychiatrist: none Name of Therapist: none  Education Status Is patient currently in school?: No Is the patient employed, unemployed or receiving disability?: Receiving disability income  Risk to self with the past 6 months Suicidal Ideation: Yes-Currently Present Has patient been a risk to self within the past 6 months prior to admission? : Yes Suicidal Intent: No Has patient had any suicidal intent within the past 6 months prior to admission? : No Is patient at risk for suicide?: Yes Suicidal Plan?: No Has patient had any suicidal plan within the past 6 months prior to admission? : No Access to Means: No What has been your use of drugs/alcohol within the last 12 months?: daily alcohol use Previous Attempts/Gestures: Yes How many times?: 2 Other Self Harm Risks: none Triggers for Past Attempts: Unknown Intentional Self Injurious Behavior: None Family Suicide History: No Recent stressful life event(s): Recent negative physical  changes Persecutory voices/beliefs?: No Depression: Yes Depression Symptoms: Tearfulness, Insomnia, Loss of interest in usual pleasures, Feeling worthless/self pity Substance abuse history and/or treatment for substance abuse?: Yes Suicide prevention information given to non-admitted patients: Yes  Risk to Others within the past 6 months Homicidal Ideation: No Does patient have any lifetime risk of violence toward others beyond the six months prior to admission? : No Thoughts of Harm to Others: No Current Homicidal Intent: No Current Homicidal Plan: No Access to Homicidal Means: No Identified Victim: none History of harm to others?: No Assessment of Violence: None Noted Violent Behavior Description: none Does patient have access to weapons?: No Criminal Charges Pending?: No Does patient have a court date: No Is patient on probation?: No  Psychosis Hallucinations: None noted Delusions: None noted  Mental Status Report Appearance/Hygiene: Unremarkable, In scrubs Eye Contact: Fair Motor Activity: Unable to assess Speech: Logical/coherent Level of Consciousness: Drowsy Mood: Depressed Affect: Depressed Anxiety Level: None Thought Processes: Coherent, Relevant Judgement: Impaired Orientation: Person, Place, Time, Situation Obsessive Compulsive Thoughts/Behaviors: None  Cognitive Functioning Concentration: Decreased Memory: Recent Intact, Remote Intact Is patient IDD: No Is patient  DD?: No Insight: Poor Impulse Control: Poor Appetite: Poor Have you had any weight changes? : No Change Sleep: Decreased Total Hours of Sleep: 5 Vegetative Symptoms: None  ADLScreening [facility] Assessment Services) Patient's cognitive ability adequate to safely complete daily activities?: Yes Patient able to express need for assistance with ADLs?: Yes Independently performs ADLs?: Yes (appropriate for developmental age)  Prior Inpatient Therapy Prior Inpatient Therapy: Yes Prior Therapy  Dates: 09/2016, 02/2009 Prior Therapy Facilty/Provider(s): [facility] Reason for Treatment: depression and alcohol  Prior Outpatient Therapy Prior Outpatient Therapy: No Does patient have an ACCT team?: No Does patient have Intensive In-House Services?  : No Does patient have [facility] services? : No Does patient have P4CC services?: No  ADL Screening (condition at time of admission) Patient's cognitive ability adequate to safely complete daily activities?: Yes Patient able to express need for assistance with ADLs?: Yes Independently performs ADLs?: Yes (appropriate for developmental age)       Abuse/Neglect Assessment (Assessment to be complete while patient is alone) Abuse/Neglect Assessment Can Be Completed: Yes Physical Abuse: Denies Verbal Abuse: Denies Sexual Abuse: Denies Exploitation of patient/patient's resources: Denies Self-Neglect: Denies Values / Beliefs Cultural Requests During Hospitalization: None Spiritual Requests During Hospitalization: None Consults Spiritual Care Consult Needed: No Social Work Consult Needed: No            Disposition:  Disposition Initial Assessment Completed for this Encounter: Yes   NP Nira Conn recommends the pt be observed overnight for safety and stabilization.  The pt is to be reassessed by psychiatry in the morning.  RN Junior and PA Hina were made aware of the recommendations.   On Site Evaluation by:   Reviewed with Physician:    Ottis Stain 07/31/2017 4:28 AM

## 2017-07-31 NOTE — ED Notes (Signed)
Awaiting a inpatient psych bed

## 2017-08-01 ENCOUNTER — Encounter (HOSPITAL_COMMUNITY): Payer: Self-pay | Admitting: *Deleted

## 2017-08-01 ENCOUNTER — Inpatient Hospital Stay (HOSPITAL_COMMUNITY)
Admission: AD | Admit: 2017-08-01 | Discharge: 2017-08-04 | DRG: 885 | Disposition: A | Discharge status: Home / Self Care | Payer: Medicare Other | Source: Intra-hospital | Attending: Psychiatry | Admitting: Psychiatry

## 2017-08-01 ENCOUNTER — Emergency Department (HOSPITAL_COMMUNITY): Payer: Medicare Other

## 2017-08-01 ENCOUNTER — Telehealth: Payer: Self-pay

## 2017-08-01 ENCOUNTER — Other Ambulatory Visit: Payer: Self-pay

## 2017-08-01 DIAGNOSIS — Z604 Social exclusion and rejection: Secondary | ICD-10-CM | POA: Diagnosis present

## 2017-08-01 DIAGNOSIS — Z79899 Other long term (current) drug therapy: Secondary | ICD-10-CM

## 2017-08-01 DIAGNOSIS — Z882 Allergy status to sulfonamides status: Secondary | ICD-10-CM | POA: Diagnosis not present

## 2017-08-01 DIAGNOSIS — Y906 Blood alcohol level of 120-199 mg/100 ml: Secondary | ICD-10-CM | POA: Diagnosis present

## 2017-08-01 DIAGNOSIS — F1014 Alcohol abuse with alcohol-induced mood disorder: Secondary | ICD-10-CM | POA: Diagnosis present

## 2017-08-01 DIAGNOSIS — F332 Major depressive disorder, recurrent severe without psychotic features: Secondary | ICD-10-CM | POA: Diagnosis present

## 2017-08-01 DIAGNOSIS — F1024 Alcohol dependence with alcohol-induced mood disorder: Secondary | ICD-10-CM | POA: Diagnosis not present

## 2017-08-01 DIAGNOSIS — Z91013 Allergy to seafood: Secondary | ICD-10-CM | POA: Diagnosis not present

## 2017-08-01 DIAGNOSIS — F17210 Nicotine dependence, cigarettes, uncomplicated: Secondary | ICD-10-CM | POA: Diagnosis present

## 2017-08-01 DIAGNOSIS — B20 Human immunodeficiency virus [HIV] disease: Secondary | ICD-10-CM | POA: Diagnosis present

## 2017-08-01 DIAGNOSIS — Z560 Unemployment, unspecified: Secondary | ICD-10-CM | POA: Diagnosis not present

## 2017-08-01 DIAGNOSIS — M542 Cervicalgia: Secondary | ICD-10-CM | POA: Diagnosis present

## 2017-08-01 DIAGNOSIS — R45851 Suicidal ideations: Secondary | ICD-10-CM

## 2017-08-01 DIAGNOSIS — G4700 Insomnia, unspecified: Secondary | ICD-10-CM | POA: Diagnosis present

## 2017-08-01 DIAGNOSIS — Z981 Arthrodesis status: Secondary | ICD-10-CM | POA: Diagnosis not present

## 2017-08-01 DIAGNOSIS — Z811 Family history of alcohol abuse and dependence: Secondary | ICD-10-CM | POA: Diagnosis not present

## 2017-08-01 DIAGNOSIS — Z634 Disappearance and death of family member: Secondary | ICD-10-CM

## 2017-08-01 DIAGNOSIS — Z915 Personal history of self-harm: Secondary | ICD-10-CM

## 2017-08-01 MED ORDER — BUPROPION HCL ER (XL) 150 MG PO TB24
150.0000 mg | ORAL_TABLET | Freq: Every day | ORAL | Status: DC
  Administered 2017-08-02 – 2017-08-04 (×3): 150 mg via ORAL
  Filled 2017-08-01 (×5): qty 1

## 2017-08-01 MED ORDER — EMTRICITABINE-TENOFOVIR AF 200-25 MG PO TABS
1.0000 | ORAL_TABLET | Freq: Every day | ORAL | Status: DC
  Administered 2017-08-02 – 2017-08-04 (×3): 1 via ORAL
  Filled 2017-08-01 (×5): qty 1

## 2017-08-01 MED ORDER — LORAZEPAM 1 MG PO TABS: 1.0000 mg | ORAL_TABLET | Freq: Two times a day (BID) | ORAL | Status: DC

## 2017-08-01 MED ORDER — LORAZEPAM 1 MG PO TABS
1.0000 mg | ORAL_TABLET | Freq: Four times a day (QID) | ORAL | Status: DC | PRN
  Administered 2017-08-02 – 2017-08-03 (×2): 1 mg via ORAL
  Filled 2017-08-01 (×3): qty 1

## 2017-08-01 MED ORDER — DIPHENHYDRAMINE HCL 25 MG PO CAPS
100.0000 mg | ORAL_CAPSULE | Freq: Every evening | ORAL | Status: DC | PRN
  Administered 2017-08-01: 100 mg via ORAL
  Filled 2017-08-01: qty 4

## 2017-08-01 MED ORDER — LORAZEPAM 1 MG PO TABS: 0.0000 mg | ORAL_TABLET | Freq: Two times a day (BID) | ORAL | Status: DC

## 2017-08-01 MED ORDER — ONDANSETRON 4 MG PO TBDP: 4.0000 mg | ORAL_TABLET | Freq: Four times a day (QID) | ORAL | Status: DC | PRN

## 2017-08-01 MED ORDER — LORAZEPAM 2 MG/ML IJ SOLN: 0.0000 mg | Freq: Two times a day (BID) | INTRAMUSCULAR | Status: DC

## 2017-08-01 MED ORDER — MAGNESIUM HYDROXIDE 400 MG/5ML PO SUSP: 30.0000 mL | Freq: Every day | ORAL | Status: DC | PRN

## 2017-08-01 MED ORDER — LOPERAMIDE HCL 2 MG PO CAPS: 2.0000 mg | ORAL_CAPSULE | ORAL | Status: DC | PRN

## 2017-08-01 MED ORDER — ACETAMINOPHEN 325 MG PO TABS
650.0000 mg | ORAL_TABLET | ORAL | Status: DC | PRN
  Administered 2017-08-01 – 2017-08-02 (×3): 650 mg via ORAL
  Filled 2017-08-01 (×3): qty 2

## 2017-08-01 MED ORDER — METHOCARBAMOL 750 MG PO TABS
750.0000 mg | ORAL_TABLET | Freq: Three times a day (TID) | ORAL | Status: DC | PRN
  Administered 2017-08-01 – 2017-08-02 (×2): 750 mg via ORAL
  Filled 2017-08-01 (×2): qty 1

## 2017-08-01 MED ORDER — VITAMIN B-1 100 MG PO TABS
100.0000 mg | ORAL_TABLET | Freq: Every day | ORAL | Status: DC
  Administered 2017-08-02 – 2017-08-04 (×3): 100 mg via ORAL
  Filled 2017-08-01 (×5): qty 1

## 2017-08-01 MED ORDER — DIPHENHYDRAMINE HCL 25 MG PO CAPS: 50.0000 mg | ORAL_CAPSULE | Freq: Every evening | ORAL | Status: DC | PRN

## 2017-08-01 MED ORDER — ACETAMINOPHEN 325 MG PO TABS: 650.0000 mg | ORAL_TABLET | Freq: Four times a day (QID) | ORAL | Status: DC | PRN

## 2017-08-01 MED ORDER — IBUPROFEN 800 MG PO TABS
800.0000 mg | ORAL_TABLET | Freq: Four times a day (QID) | ORAL | Status: DC | PRN
  Administered 2017-08-01 – 2017-08-04 (×9): 800 mg via ORAL
  Filled 2017-08-01 (×9): qty 1

## 2017-08-01 MED ORDER — IBUPROFEN 800 MG PO TABS
800.0000 mg | ORAL_TABLET | Freq: Four times a day (QID) | ORAL | Status: DC | PRN
  Administered 2017-08-01: 800 mg via ORAL
  Filled 2017-08-01: qty 1

## 2017-08-01 MED ORDER — VITAMIN B-1 100 MG PO TABS
100.0000 mg | ORAL_TABLET | Freq: Every day | ORAL | Status: DC
  Filled 2017-08-01: qty 1

## 2017-08-01 MED ORDER — LORAZEPAM 1 MG PO TABS: 0.0000 mg | ORAL_TABLET | Freq: Four times a day (QID) | ORAL | Status: DC

## 2017-08-01 MED ORDER — DARUNAVIR-COBICISTAT 800-150 MG PO TABS
1.0000 | ORAL_TABLET | Freq: Every day | ORAL | Status: DC
  Administered 2017-08-02 – 2017-08-04 (×3): 1 via ORAL
  Filled 2017-08-01 (×5): qty 1

## 2017-08-01 MED ORDER — LORAZEPAM 1 MG PO TABS: 1.0000 mg | ORAL_TABLET | Freq: Every day | ORAL | Status: DC

## 2017-08-01 MED ORDER — LORAZEPAM 1 MG PO TABS
1.0000 mg | ORAL_TABLET | Freq: Four times a day (QID) | ORAL | Status: AC
  Administered 2017-08-01 – 2017-08-02 (×2): 1 mg via ORAL
  Filled 2017-08-01 (×2): qty 1

## 2017-08-01 MED ORDER — THIAMINE HCL 100 MG/ML IJ SOLN: 100.0000 mg | Freq: Every day | INTRAMUSCULAR | Status: DC

## 2017-08-01 MED ORDER — LORAZEPAM 2 MG/ML IJ SOLN: 0.0000 mg | Freq: Four times a day (QID) | INTRAMUSCULAR | Status: DC

## 2017-08-01 MED ORDER — LORAZEPAM 1 MG PO TABS
1.0000 mg | ORAL_TABLET | Freq: Three times a day (TID) | ORAL | Status: DC
  Administered 2017-08-02: 1 mg via ORAL

## 2017-08-01 MED ORDER — ADULT MULTIVITAMIN W/MINERALS CH
1.0000 | ORAL_TABLET | Freq: Every day | ORAL | Status: DC
  Administered 2017-08-02 – 2017-08-04 (×3): 1 via ORAL
  Filled 2017-08-01 (×5): qty 1

## 2017-08-01 MED ORDER — HYDROXYZINE HCL 25 MG PO TABS
25.0000 mg | ORAL_TABLET | Freq: Four times a day (QID) | ORAL | Status: DC | PRN
  Administered 2017-08-02 – 2017-08-03 (×2): 25 mg via ORAL
  Filled 2017-08-01 (×2): qty 1

## 2017-08-01 MED ORDER — ALUM & MAG HYDROXIDE-SIMETH 200-200-20 MG/5ML PO SUSP: 30.0000 mL | ORAL | Status: DC | PRN

## 2017-08-01 MED ORDER — TRAZODONE HCL 50 MG PO TABS
50.0000 mg | ORAL_TABLET | Freq: Every evening | ORAL | Status: DC | PRN
  Filled 2017-08-01 (×3): qty 1

## 2017-08-01 NOTE — Progress Notes (Signed)
Pt is new to the unit late this afternoon.  He is requesting pain meds, and says he has a prescription in his locker.  He received Tylenol and Motrin at 1820 and reports he needs stronger medication.  He is c/o neck pain 10/10.  Pt also is asking about sleep med, specifically Benadryl.  There was an order for Benadryl 50 mg for itching in his MAR, but pt says he takes 100 mg at night for sleep.  Writer spoke to the evening provider who gave Trazodone for sleep and Robaxin to help with his pain.  Pt was notified about the med changes and he stated he cannot take Trazodone.  Writer again spoke to the provider who modified the Benadryl order to read 100 mg for sleep and the Trazodone was discontinued.  Pt was informed that no other pain meds would be ordered tonight and he would have to speak with the doctor tomorrow.  Pt has been quite needy and demanding.  He also wanted to get some money out of his wallet and some phone numbers.  Pt was allowed to do this, with the understanding that he cannot go back into his locker again until discharge.  Pt contracts for safety.  He denies HI/AVH at this time.  Pt denies any withdrawal symptoms at this time, and is focused on neck pain and getting oxycodone for his pain.  Support and encouragement offered.  Meds given as ordered.  Discharge plans are in process.  Safety maintained with q15 minute checks.

## 2017-08-01 NOTE — Progress Notes (Signed)
Patient ID: [name], male   DOB: [birth year], 57 y.o.   MRN: [id number] PER STATE REGULATIONS 482.30  THIS CHART WAS REVIEWED FOR MEDICAL NECESSITY WITH RESPECT TO THE PATIENT'S ADMISSION/DURATION OF STAY.  NEXT REVIEW DATE:08/05/17  Loura HaltBARBARA Lillie Portner, RN, BSN CASE MANAGER

## 2017-08-01 NOTE — ED Notes (Signed)
Pt is up to bathroom without difficulty .  He is able to care for self independently.

## 2017-08-01 NOTE — Tx Team (Signed)
Initial Treatment Plan 08/01/2017 6:18 PM [name] Nosenthony Murguia [id number]    PATIENT STRESSORS: Health problems Medication change or noncompliance Substance abuse   PATIENT STRENGTHS: Ability for insight Average or above average intelligence Capable of independent living General fund of knowledge Motivation for treatment/growth   PATIENT IDENTIFIED PROBLEMS: Depression Alcohol abuse Suicidal thoughts "Want to get better and think better"                     DISCHARGE CRITERIA:  Ability to meet basic life and health needs Improved stabilization in mood, thinking, and/or behavior Reduction of life-threatening or endangering symptoms to within safe limits Verbal commitment to aftercare and medication compliance Withdrawal symptoms are absent or subacute and managed without 24-hour nursing intervention  PRELIMINARY DISCHARGE PLAN: Attend aftercare/continuing care group Return to previous living arrangement  PATIENT/FAMILY INVOLVEMENT: This treatment plan has been presented to and reviewed with the patient, [name], and/or family member, .  The patient and family have been given the opportunity to ask questions and make suggestions.  Khyree Carillo, [place], [place] 08/01/2017, 6:18 PM

## 2017-08-01 NOTE — ED Notes (Signed)
Pt discharged safely with [place] driver.  Pt was in no distress.  All belongings were returned to patient. 

## 2017-08-01 NOTE — Telephone Encounter (Signed)
Janice Coffinom  Hughes with [facility] called to inform Dr Dr[facility] SecondSnider [facility] .  Adult unit : [phone]  Laurell Josephsammy K Dink Creps, RN

## 2017-08-01 NOTE — Consult Note (Addendum)
[facility] Face-to-Face Psychiatry Consult   Reason for Consult:  Suicide plan Referring Physician:  EDP Patient Identification: [name] MRN:  [id number] Principal Diagnosis: MDD (major depressive disorder), recurrent severe, without psychosis ([place]) Diagnosis:   Patient Active Problem List   Diagnosis Date Noted  . MDD (major depressive disorder), recurrent severe, without psychosis ([place]) [F33.2] 09/18/2016    Priority: Medium  . Dysphagia [R13.10] 07/08/2017  . 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 Left-sided weakness [R53.1] 06/27/2017  . Tricompartment osteoarthritis of right knee [M17.11] 03/27/2017  . Alcohol abuse with alcohol-induced mood disorder ([place]) [F10.14] 09/25/2016  . Polysubstance dependence ([place]) [F19.20] 09/25/2016  . Substance use disorder [F19.90] 09/19/2016  . 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 HIV disease ([place]) [B20] 11/09/2008    Total Time spent with patient: 30 minutes  Subjective:   [name] is a 57 y.o. male patient admitted with suicide plan.  HPI:  57 yo male who presented to the ED with suicidal ideations with a plan to overdose.  His depression started when his neck pain increased a few weeks ago.  Now he is frustrated with his temporary limited physical mobility which makes his pain worse.  He has been self-medicating with alcohol to help the depression but it made it worse.  Medications make it better.  Depression increased to the point of wanting to overdose.  Endorses feelings of hopelessness, helplessness, and worthlessness.  Denies homicidal ideations and hallucinations and withdrawal symptoms.  Caveat:  His narcotic prescription was filled on 7/12 and his UDS was negative and drinking.  Ibuprofen now in place for pain.  Past Psychiatric History: depression  Risk to Self: Suicidal Ideation: Yes-Currently Present Suicidal Intent: No Is patient at risk for suicide?: Yes Suicidal Plan?: No Access to  Means: No What has been your use of drugs/alcohol within the last 12 months?: daily alcohol use How many times?: 2 Other Self Harm Risks: none Triggers for Past Attempts: Unknown Intentional Self Injurious Behavior: None Risk to Others: Homicidal Ideation: No Thoughts of Harm to Others: No Current Homicidal Intent: No Current Homicidal Plan: No Access to Homicidal Means: No Identified Victim: none History of harm to others?: No Assessment of Violence: None Noted Violent Behavior Description: none Does patient have access to weapons?: No Criminal Charges Pending?: No Does patient have a court date: No Prior Inpatient Therapy: Prior Inpatient Therapy: Yes Prior Therapy Dates: 09/2016, 02/2009 Prior Therapy Facilty/Provider(s): [facility] Reason for Treatment: depression and alcohol Prior Outpatient Therapy: Prior Outpatient Therapy: No Does patient have an ACCT team?: No Does patient have Intensive In-House Services?  : No Does patient have [facility] services? : No Does patient have P4CC services?: No  Past Medical History:  Past Medical History:  Diagnosis Date  . 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 Hemorrhoids   . HIV (human immunodeficiency virus infection) ([place])   . Stenosis of cervical spine    withb myelopathy    Past Surgical History:  Procedure Laterality Date  . ANTERIOR CERVICAL DECOMP/DISCECTOMY FUSION N/A 07/06/2017   Procedure: ANTERIOR CERVICAL DECOMPRESSION/DISCECTOMY FUSION C3-4/C4-5 2 LEVELS;  Surgeon: Consuella Lose, MD;  Location: [place];  Service: Neurosurgery;  Laterality: N/A;  . HERNIA REPAIR  97   lt ing  . RECTAL EXAM UNDER ANESTHESIA N/A 04/24/2014   Procedure:  ligation of bleeding vessels;  Surgeon: Stark Klein, MD;  Location: [facility];  Service: General;  Laterality: N/A;  . TENDON REPAIR     2006-lt index  .  WART FULGURATION  07/06/2011   Procedure: FULGURATION ANAL WART;  Surgeon: Harl Bowie, MD;  Location: [place];  Service:  General;  Laterality: N/A;  excision anal condyloma  . WART FULGURATION N/A 04/23/2014   Procedure: EXCISION OF ANAL CONDYLOMA;  Surgeon: Coralie Keens, MD;  Location: [facility];  Service: General;  Laterality: N/A;   Family History:  Family History  Problem Relation Age of Onset  . Cancer Mother        brain  . Cancer Sister        breast   Family Psychiatric  History: none Social History:  Social History   Substance and Sexual Activity  Alcohol Use Yes  . Alcohol/week: 4.2 - 4.8 oz  . Types: 7 - 8 Cans of beer per week   Comment: a case every 3 to 4 days     Social History   Substance and Sexual Activity  Drug Use No    Social History   Socioeconomic History  . Marital status: Single    Spouse name: Not on file  . Number of children: Not on file  . Years of education: Not on file  . Highest education level: Not on file  Occupational History  . Not on file  Social Needs  . Financial resource strain: Not on file  . Food insecurity:    Worry: Not on file    Inability: Not on file  . Transportation needs:    Medical: Not on file    Non-medical: Not on file  Tobacco Use  . Smoking status: Current Every Day Smoker    Packs/day: 0.50    Years: 25.00    Pack years: 12.50    Types: Cigarettes  . Smokeless tobacco: Never Used  Substance and Sexual Activity  . Alcohol use: Yes    Alcohol/week: 4.2 - 4.8 oz    Types: 7 - 8 Cans of beer per week    Comment: a case every 3 to 4 days  . Drug use: No  . Sexual activity: Never  Lifestyle  . Physical activity:    Days per week: Not on file    Minutes per session: Not on file  . Stress: Not on file  Relationships  . Social connections:    Talks on phone: Not on file    Gets together: Not on file    Attends religious service: Not on file    Active member of club or organization: Not on file    Attends meetings of clubs or organizations: Not on file    Relationship status: Not on file  Other Topics Concern  . Not  on file  Social History Narrative  . Not on file   Additional Social History: N/A    Allergies:   Allergies  Allergen Reactions  . Shellfish Allergy Anaphylaxis and Swelling    Swelling everywhere  . Sulfa Antibiotics Anaphylaxis, Hives, Itching and Swelling    Swelling all over   . Fish Allergy Swelling    Labs:  Results for orders placed or performed during the hospital encounter of 07/31/17 (from the past 48 hour(s))  Comprehensive metabolic panel     Status: Abnormal   Collection Time: 07/31/17  1:35 AM  Result Value Ref Range   Sodium 136 135 - 145 mmol/L   Potassium 3.8 3.5 - 5.1 mmol/L   Chloride 102 98 - 111 mmol/L   CO2 25 22 - 32 mmol/L   Glucose, Bld 84 70 -  99 mg/dL   BUN 13 6 - 20 mg/dL   Creatinine, Ser 1.06 0.61 - 1.24 mg/dL   Calcium 9.2 8.9 - 10.3 mg/dL   Total Protein 7.5 6.5 - 8.1 g/dL   Albumin 4.2 3.5 - 5.0 g/dL   AST 29 15 - 41 U/L   ALT 23 0 - 44 U/L   Alkaline Phosphatase 44 38 - 126 U/L   Total Bilirubin 1.3 (H) 0.3 - 1.2 mg/dL   GFR calc non Af Amer >60 >60 mL/min   GFR calc Af Amer >60 >60 mL/min    Comment: (NOTE) The eGFR has been calculated using the CKD EPI equation. This calculation has not been validated in all clinical situations. eGFR's persistently <60 mL/min signify possible Chronic Kidney Disease.    Anion gap 9 5 - 15    Comment: Performed at [facility], [place] [address], [place], [place] [address]  Ethanol     Status: Abnormal   Collection Time: 07/31/17  1:35 AM  Result Value Ref Range   Alcohol, Ethyl (B) 173 (H) <10 mg/dL    Comment: (NOTE) Lowest detectable limit for serum alcohol is 10 mg/dL. For medical purposes only. Performed at [facility], [place] [address], [place], [place] [address]   cbc     Status: Abnormal   Collection Time: 07/31/17  1:35 AM  Result Value Ref Range   WBC 5.5 4.0 - 10.5 K/uL   RBC 4.39 4.22 - 5.81 MIL/uL   Hemoglobin 12.6 (L) 13.0 - 17.0 g/dL   HCT  37.8 (L) 39.0 - 52.0 %   MCV 86.1 78.0 - 100.0 fL   MCH 28.7 26.0 - 34.0 pg   MCHC 33.3 30.0 - 36.0 g/dL   RDW 15.4 11.5 - 15.5 %   Platelets 259 150 - 400 K/uL    Comment: Performed at [facility], [place] [address] [place], [place] [address]  Rapid urine drug screen (hospital performed)     Status: None   Collection Time: 07/31/17  1:35 AM  Result Value Ref Range   Opiates NONE DETECTED NONE DETECTED   Cocaine NONE DETECTED NONE DETECTED   Benzodiazepines NONE DETECTED NONE DETECTED   Amphetamines NONE DETECTED NONE DETECTED   Tetrahydrocannabinol NONE DETECTED NONE DETECTED   Barbiturates NONE DETECTED NONE DETECTED    Comment: (NOTE) DRUG SCREEN FOR MEDICAL PURPOSES ONLY.  IF CONFIRMATION IS NEEDED FOR ANY PURPOSE, NOTIFY LAB WITHIN 5 DAYS. LOWEST DETECTABLE LIMITS FOR URINE DRUG SCREEN Drug Class                     Cutoff (ng/mL) Amphetamine and metabolites    1000 Barbiturate and metabolites    200 Benzodiazepine                 027 Tricyclics and metabolites     300 Opiates and metabolites        300 Cocaine and metabolites        300 THC                            50 Performed at [facility], [place] [address] [place], [place] [address]     Current Facility-Administered Medications  Medication Dose Route Frequency Provider Last Rate Last Dose  . acetaminophen (TYLENOL) tablet 650 mg  650 mg Oral Q4H PRN Daleen Bo, MD   650 mg at 07/31/17 1025  .  buPROPion (WELLBUTRIN XL) 24 hr tablet 150 mg  150 mg Oral Daily Nanci Pina, FNP   150 mg at 08/01/17 1031  . darunavir-cobicistat (PREZCOBIX) 800-150 MG per tablet 1 tablet  1 tablet Oral Q breakfast Khatri, Hina, PA-C   1 tablet at 08/01/17 0814  . diphenhydrAMINE (BENADRYL) capsule 50 mg  50 mg Oral QHS PRN Khatri, Hina, PA-C   50 mg at 08/01/17 0240  . emtricitabine-tenofovir AF (DESCOVY) 200-25 MG per tablet 1 tablet  1 tablet Oral Daily Khatri, Hina, PA-C   1 tablet at  08/01/17 0815  . ibuprofen (ADVIL,MOTRIN) tablet 800 mg  800 mg Oral Q6H PRN Khatri, Hina, PA-C      . LORazepam (ATIVAN) injection 0-4 mg  0-4 mg Intravenous Q6H Khatri, Hina, PA-C       Or  . LORazepam (ATIVAN) tablet 0-4 mg  0-4 mg Oral Q6H Khatri, Hina, PA-C   1 mg at 08/01/17 1030  . [START ON 08/02/2017] LORazepam (ATIVAN) injection 0-4 mg  0-4 mg Intravenous Q12H Khatri, Hina, PA-C       Or  . [START ON 08/02/2017] LORazepam (ATIVAN) tablet 0-4 mg  0-4 mg Oral Q12H Khatri, Hina, PA-C      . thiamine (VITAMIN B-1) tablet 100 mg  100 mg Oral Daily Khatri, Hina, PA-C   100 mg at 08/01/17 1030   Or  . thiamine (B-1) injection 100 mg  100 mg Intravenous Daily Khatri, Hina, PA-C       Current Outpatient Medications  Medication Sig Dispense Refill  . buPROPion (WELLBUTRIN XL) 300 MG 24 hr tablet Take 1 tablet (300 mg total) by mouth every morning. For depression 10 tablet 0  . darunavir-cobicistat (PREZCOBIX) 800-150 MG tablet Take 1 tablet by mouth daily. Swallow whole. Do NOT crush, break or chew tablets. Take with food, take with Descovy. 30 tablet 5  . diphenhydrAMINE (BENADRYL) 50 MG tablet Take 100 mg by mouth at bedtime as needed for sleep.    Marland Kitchen emtricitabine-tenofovir AF (DESCOVY) 200-25 MG tablet Take 1 tablet by mouth daily. Take with Prezcobix. 30 tablet 5  . ibuprofen (ADVIL,MOTRIN) 200 MG tablet Take 800 mg by mouth every 6 (six) hours as needed for moderate pain.    Marland Kitchen acetaminophen (TYLENOL) 325 MG tablet Take 2 tablets (650 mg total) by mouth every 6 (six) hours as needed for mild pain (or Fever >/= 101). (Patient not taking: Reported on 07/08/2017)    . folic acid (FOLVITE) 1 MG tablet Take 1 tablet (1 mg total) by mouth daily. (Patient not taking: Reported on 07/08/2017) 30 tablet 0  . methocarbamol (ROBAXIN-750) 750 MG tablet Take 1 tablet (750 mg total) by mouth 3 (three) times daily as needed for muscle spasms. (Patient not taking: Reported on 07/31/2017) 90 tablet 1  .  methylPREDNISolone (MEDROL) 4 MG TBPK tablet Take according to package insert (Patient not taking: Reported on 07/19/2017) 21 tablet 0  . thiamine 100 MG tablet Take 1 tablet (100 mg total) by mouth daily. (Patient not taking: Reported on 07/31/2017) 30 tablet 0    Musculoskeletal: Strength & Muscle Tone: decreased Gait & Station: unsteady Patient leans: N/A  Psychiatric Specialty Exam: Physical Exam  Nursing note and vitals reviewed. Constitutional: He is oriented to person, place, and time. He appears well-developed and well-nourished.  HENT:  Head: Normocephalic and atraumatic.  Neck: Normal range of motion.  Respiratory: Effort normal.  Musculoskeletal: Normal range of motion.  Neurological: He is alert and oriented to  person, place, and time.  Psychiatric: His speech is normal and behavior is normal. Judgment normal. His mood appears anxious. His affect is blunt. Cognition and memory are normal. He exhibits a depressed mood. He expresses suicidal ideation. He expresses suicidal plans.    Review of Systems  Musculoskeletal: Positive for neck pain.  Psychiatric/Behavioral: Positive for depression, substance abuse and suicidal ideas. The patient is nervous/anxious.   All other systems reviewed and are negative.   Blood pressure (!) 144/83, pulse 75, temperature 97.7 F (36.5 C), temperature source Oral, resp. rate 19, SpO2 99 %.There is no height or weight on file to calculate BMI.  General Appearance: Casual  Eye Contact:  Fair  Speech:  Normal Rate  Volume:  Decreased  Mood:  Anxious and Depressed  Affect:  Blunt  Thought Process:  Coherent and Descriptions of Associations: Intact  Orientation:  Full (Time, Place, and Person)  Thought Content:  Rumination  Suicidal Thoughts:  Yes.  with intent/plan  Homicidal Thoughts:  No  Memory:  Immediate;   Fair Recent;   Fair Remote;   Fair  Judgement:  Fair  Insight:  Fair  Psychomotor Activity:  Decreased  Concentration:   Concentration: Fair and Attention Span: Fair  Recall:  [employer] of Knowledge:  Fair  Language:  Good  Akathisia:  No  Handed:  Right  AIMS (if indicated):   N/A  Assets:  Housing Leisure Time Resilience Social Support  ADL's:  Intact  Cognition:  WNL  Sleep:   N/A     Treatment Plan Summary: Daily contact with patient to assess and evaluate symptoms and progress in treatment, Medication management and Plan major depressive disorder, recurrent, severe without psychosis:  -Started Ativan alcohol detox protocol -Restarted Wellbutrin 150 mg daily for depression  Disposition: Recommend psychiatric Inpatient admission when medically cleared.  Waylan Boga, NP 08/01/2017 11:30 AM   Patient seen face-to-face for psychiatric evaluation, chart reviewed and case discussed with the physician extender and developed treatment plan. Reviewed the information documented and agree with the treatment plan.  Buford Dresser, DO 08/01/17 12:22 PM

## 2017-08-01 NOTE — Progress Notes (Signed)
Patient has requested to speak with the night time MD due to his wanting Percocet. Patient was irritable when writer tried to explain to patient there is a process and I cannot get his medication right away. Patient was given Tylenol and Ibuprofen for his neck pain rated 10 out of 10.

## 2017-08-01 NOTE — Progress Notes (Signed)
[name] is a 57 year old male pt admitted on voluntary basis. On admission, he does endorse passive SI but able to contract for safety on the unit. He reports on-going depression and spoke about how pain from recent back surgery is a stressor currently. He does endorse daily alcohol use and denies any other substance abuse issue currently. He currently does not display any significant signs or symptoms of withdrawal. He reports that he was not taking his medications as he should prior to admission. He reports that he lives alone and reports that he will be able to go back there after discharge. [name] is currently ambulating with cane and gait is very unsteady. He was admitted to the hospital and safety maintained.

## 2017-08-01 NOTE — BHH Counselor (Signed)
[facility]

## 2017-08-01 NOTE — BH Assessment (Signed)
[facility]  Per Juanetta BeetsJacqueline Norman, DO, this pt requires psychiatric hospitalization at this time.  Malva LimesLinsey Strader, RN, [facility]; she will call when [facility].  Pt has signed Voluntary Admission and Consent for Treatment, as well as Consent to Release Information to his sister, and to the [facility], and a notification call has been placed to the provider.  Signed forms have [facility].  Pt's nurse, Kendal Hymendie, has been notified, and agrees to send original paperwork along with pt via [facility] [place], and to call report to [phone].  Doylene Canninghomas Sennie Borden, [place] Behavioral Health Coordinator [phone]

## 2017-08-02 DIAGNOSIS — F332 Major depressive disorder, recurrent severe without psychotic features: Principal | ICD-10-CM

## 2017-08-02 DIAGNOSIS — F1024 Alcohol dependence with alcohol-induced mood disorder: Secondary | ICD-10-CM

## 2017-08-02 DIAGNOSIS — R45851 Suicidal ideations: Secondary | ICD-10-CM

## 2017-08-02 DIAGNOSIS — Z811 Family history of alcohol abuse and dependence: Secondary | ICD-10-CM

## 2017-08-02 MED ORDER — OXYCODONE-ACETAMINOPHEN 5-325 MG PO TABS
1.0000 | ORAL_TABLET | Freq: Three times a day (TID) | ORAL | Status: DC | PRN
  Administered 2017-08-02 – 2017-08-04 (×6): 1 via ORAL
  Filled 2017-08-02 (×6): qty 1

## 2017-08-02 NOTE — Progress Notes (Signed)
Patient denies SI, HI and AVH this shift.  Patient reports pain from recent surgery but has attended groups and been compliant with medications.   Assess patient for safety, offer medications as prescribed, engage patient in 1:1 staff talks.   Patient able to contract for safety. Continue to monitor as planned.

## 2017-08-02 NOTE — BHH Suicide Risk Assessment (Signed)
BHH INPATIENT:  Family/Significant Other Suicide Prevention Education  Suicide Prevention Education:  Education Completed; [name] (pt's sister) [phone] has been identified by the patient as the family member/significant other with whom the patient will be residing, and identified as the person(s) who will aid the patient in the event of a mental health crisis (suicidal ideations/suicide attempt).  With written consent from the patient, the family member/significant other has been provided the following suicide prevention education, prior to the and/or following the discharge of the patient.  The suicide prevention education provided includes the following:  Suicide risk factors  Suicide prevention and interventions  National Suicide Hotline telephone number  [facility] assessment telephone number  [facility] Emergency Assistance 911  [facility] and/or Residential Mobile Crisis Unit telephone number  Request made of family/significant other to:  Remove weapons (e.g., guns, rifles, knives), all items previously/currently identified as safety concern.    Remove drugs/medications (over-the-counter, prescriptions, illicit drugs), all items previously/currently identified as a safety concern.  The family member/significant other verbalizes understanding of the suicide prevention education information provided.  The family member/significant other agrees to remove the items of safety concern listed above.  SPE and aftercare reviewed with pt's sister. She has no concerns regarding pt returning home and is concerned that he make it to follow-up appt for his neck surgery on Monday, 7/29. She confirmed that pt does not have access to weapons/firearms. Pt's sister is very close with pt and plans to visit with him several times a week to ensure that he is still doing well after discharge. She is comfortable with pt discharging as soon as the MD feels he is ready.    [name] RavensHeather S Alis Sawchuk LCSW 08/02/2017, 11:08 AM

## 2017-08-02 NOTE — BHH Group Notes (Signed)
LCSW Group Therapy Note  08/02/2017 1:15pm  Type of Therapy/Topic:  Group Therapy:  Feelings about Diagnosis  Participation Level:  Did Not Attend--pt invited. Was meeting with MD during group. Excused.    Description of Group:   This group will allow patients to explore their thoughts and feelings about diagnoses they have received. Patients will be guided to explore their level of understanding and acceptance of these diagnoses. Facilitator will encourage patients to process their thoughts and feelings about the reactions of others to their diagnosis and will guide patients in identifying ways to discuss their diagnosis with significant others in their lives. This group will be process-oriented, with patients participating in exploration of their own experiences, giving and receiving support, and processing challenge from other group members.   Therapeutic Goals: 1. Patient will demonstrate understanding of diagnosis as evidenced by identifying two or more symptoms of the disorder 2. Patient will be able to express two feelings regarding the diagnosis 3. Patient will demonstrate their ability to communicate their needs through discussion and/or role play  Summary of Patient Progress:     x  Therapeutic Modalities:   Cognitive Behavioral Therapy Brief Therapy Feelings Identification    Rona RavensHeather S Sinclair All[place]ood, LCSW 08/02/2017 2:44 PM

## 2017-08-02 NOTE — [facility] Suicide Risk Assessment (Signed)
[facility]   Nursing information obtained from:  Patient Demographic factors:  Male, Low socioeconomic status, Living alone, Unemployed Current Mental Status:  Suicidal ideation indicated by patient, Self-harm thoughts, Intention to act on suicide plan Loss Factors:  Decline in physical health Historical Factors:  Prior suicide attempts, Family history of mental illness or substance abuse Risk Reduction Factors:  Positive coping skills or problem solving skills  Total Time spent with patient: 45 minutes Principal Problem:  MDD, Alcohol Use Disorder, S/P cervical surgery  Diagnosis:   Patient Active Problem List   Diagnosis Date Noted  . Major depressive disorder, recurrent severe without psychotic features (HCC) [F33.2] 08/01/2017  . Dysphagia [R13.10] 07/08/2017  . 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 Left-sided weakness [R53.1] 06/27/2017  . Tricompartment osteoarthritis of right knee [M17.11] 03/27/2017  . Alcohol abuse with alcohol-induced mood disorder (HCC) [F10.14] 09/25/2016  . Polysubstance dependence (HCC) [F19.20] 09/25/2016  . Substance use disorder [F19.90] 09/19/2016  . MDD (major depressive disorder), recurrent severe, without psychosis (HCC) [F33.2] 09/18/2016  . 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 HIV disease (HCC) [B20] 11/09/2008   Subjective Data:   Continued Clinical Symptoms:  Alcohol Use Disorder Identification Test Final Score (AUDIT): 35 The "Alcohol Use Disorders Identification Test", Guidelines for Use in Primary Care, Second Edition.  World Science writerHealth [employer] [facility](WHO). Score between 0-7:  no or low risk or alcohol related problems. Score between 8-15:  moderate risk of alcohol related problems. Score between 16-19:  high risk of alcohol related problems. Score 20 or above:  warrants further diagnostic evaluation for alcohol dependence and treatment.   CLINICAL FACTORS:  57 year old  male, presents for depression, passive SI, increased pattern of alcohol consumption over the last 3 weeks . States has been drinking up to 160 ounces of beer on some days. He had an elective neck surgery about a month ago, and states that recovery has been gradual, remains in pain. At this time reports feeling better, with improving mood , denies suicidal ideations. No current alcohol WDL symptoms.   Psychiatric Specialty Exam: Physical Exam  ROS  Blood pressure (!) 143/114, pulse 94, temperature 97.6 F (36.4 C), temperature source Oral, resp. rate 16, height 6' (1.829 m), weight 78 kg (172 lb), SpO2 100 %.Body mass index is 23.33 kg/m.  See admit note MSE    COGNITIVE FEATURES THAT CONTRIBUTE TO RISK:  Closed-mindedness and Loss of executive function    SUICIDE RISK:   Moderate:  Frequent suicidal ideation with limited intensity, and duration, some specificity in terms of plans, no associated intent, good self-control, limited dysphoria/symptomatology, some risk factors present, and identifiable protective factors, including available and accessible social support.  PLAN OF CARE: Patient will be admitted to inpatient psychiatric unit for stabilization and safety. Will provide and encourage milieu participation. Provide medication management and maked adjustments as needed. Will also provide medication management to minimize risk of WDL.  Will follow daily.    I certify that inpatient services furnished can reasonably be expected to improve the patient's condition.   Craige CottaFernando A Krystalyn Kubota, MD 08/02/2017, 2:21 PM

## 2017-08-02 NOTE — [facility] Counselor (Signed)
Adult Comprehensive Assessment  Patient ID: [name], male   DOB: May 23, 1960, 57 y.o.   MRN: [id number]  Information Source: Information source: Patient  Current Stressors:  Patient states their primary concerns and needs for treatment are:: "I was suicidal but now I'm feeling better." Patient states their goals for this hospitilization and ongoing recovery are:: "To get off alcohol and [facility] [employer]. I need to discharge. I feel better."   Educational / Learning stressors: graduated high school Employment / Job issues: on disability Family Relationships: poor-single; no kids; parents deceased--mother and sistered died in car accident last month Financial / Lack of resources (include bankruptcy): disability; medicare, and [employer] Housing / Lack of housing: [place] himself  Physical health (include injuries & life threatening diseases): HIV positive; recent back surgery; walking with a cane. Some ongoing pain issues. Social relationships: poor-few social supports Substance abuse: alcohol-heavy use ongoing for years.  Bereavement / Loss: pt's mother and sister died in car accident last month.  Living/Environment/Situation: Living Arrangements: Alone Living conditions (as described by patient or guardian): living in apt.  How long has patient lived in current situation?: several years  What is atmosphere in current home: Comfortable  Family History: Marital status: Single Are you sexually active?: No What is your sexual orientation?: pt declined to comment Has your sexual activity been affected by drugs, alcohol, medication, or emotional stress?: n/a  Does patient have children?: No  Childhood History: By whom was/is the patient raised?: Mother, Grandparents Additional childhood history information: mother and grandmother were primary caretakers; father in and out in childhood.  Description of patient's relationship with caregiver when they  were a child: close to all caregivers  Patient's description of current relationship with people who raised him/her: close to father-He died of cancer. mother died last month-pt is grieving and drinking heavily How were you disciplined when you got in trouble as a child/adolescent?: n/a  Does patient have siblings?: Yes Number of Siblings: 1 Description of patient's current relationship with siblings: sister-died in car accident last month with his mother.  Did patient suffer any verbal/emotional/physical/sexual abuse as a child?: No Did patient suffer from severe childhood neglect?: No Has patient ever been sexually abused/assaulted/raped as an adolescent or adult?: No Was the patient ever a victim of a crime or a disaster?: No Witnessed domestic violence?: No Has patient been effected by domestic violence as an adult?: No  Education: Highest grade of school patient has completed: 12 Currently a student?: No Name of school: NA Learning disability?: No  Employment/Work Situation: Employment situation: On disability Why is patient on disability: HIV How long has patient been on disability: several years Patient's job has been impacted by current illness: No What is the longest time patient has a held a job?: pt declined to say Where was the patient employed at that time?: pt declined to say  Has patient ever been in the [employer]ilitary?: No Has patient ever served in combat?: No Did You Receive Any Psychiatric Treatment/Services While in [employer] Military?: No Are There Guns or Other Weapons in Your Home?: No Are These Weapons Safely Secured?: (n/a)  Financial Resources: Financial resources: Insurance claims handlereceives SSDI, Medicare, Medicaid Does patient have a representative payee or guardian?: No  Alcohol/Substance Abuse: What has been your use of drugs/alcohol within the last 12 months?: heavy alcohol abuse-ongoing. use increased over the past month after neck surgery. "I"m working on cutting  down."  If attempted suicide, did drugs/alcohol play a  role in this?: No, however pt reports increased thoughts of suicide since decreased mobility and chronic pain after neck surgery.  Alcohol/Substance Abuse Treatment Hx: Past Tx, Inpatient If yes, describe treatment: [facility] 2018; [facility] and [facility] years ago.  Has alcohol/substance abuse ever caused legal problems?: No  Social Support System: Forensic psychologist System: Poor Describe Community Support System: few friends in community Type of faith/religion: christian How does patient's faith help to cope with current illness?: prayer  Leisure/Recreation: Leisure and Hobbies: "nothing"  Strengths/Needs: What things does the patient do well?: "I don't know." In what areas does patient struggle / problems for patient: coping with grief/loss; alcohol addiction is ongoing; limited insight; limited social supports  Discharge Plan: Does patient have access to transportation?: Yes bus Will patient be returning to same living situation after discharge?: yes, "I can go home."  Plan for living situation after discharge: home in apt.  Currently receiving community mental health services: [facility] at [facility] is writing psych medications currently If no, would patient like referral for services when discharged?: Yes (What county?) [facility] Does patient have financial barriers related to discharge medications?: No  Summary/Recommendations:   Summary and Recommendations (to be completed by the evaluator): Patient is 57yo male living in [place], [place] alone. He presents to the hospital seeking treatment for SI, increased depression since recent back surgery, pain issues, alcohol abuse, and for medication stabilization. Pt has a diagnosis of Alcohol Use Disorder and MDD. Pt currently denies SI/HI/AVH. He reports he is separated, is on disability, lives alone and plans to return home at discharge. He is agreeable to  [facility] referral at discharge and named his sister as his primary emotional support. Recommendations for patient include: crisis stablization, therapeutic milieu, encourage group attendance and participation, medication management for detox/mood stabilization, and development of comprehensive mental wellness/sobriety plan. CSW assessing for appropriate referrals.   Rona Ravens LCSW 08/02/2017 10:06 AM

## 2017-08-02 NOTE — H&P (Addendum)
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 Major depressive disorder, recurrent severe without psychotic features (HCC) [F33.2] 08/01/2017  . Dysphagia [R13.10] 07/08/2017  . 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 Left-sided weakness [R53.1] 06/27/2017  . Tricompartment osteoarthritis of right knee [M17.11] 03/27/2017  . Alcohol abuse with alcohol-induced mood disorder (HCC) [F10.14] 09/25/2016  . Polysubstance dependence (HCC) [F19.20] 09/25/2016  . Substance use disorder [F19.90] 09/19/2016  . MDD (major depressive disorder), recurrent severe, without psychosis (HCC) [F33.2] 09/18/2016  . 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 HIV disease (HCC) [B20] 11/09/2008   History of Present Illness:57 year old single male, lives alone, presented to the ER voluntarily reporting worsening depression and increased alcohol consumption/daily drinking . States that about a month ago ( 6/28) he had elective cervical spine surgery ( anterior cervical discectomy, fusion) . After surgery was readmitted to inpatient unit due to  neck inflammation, pain. Reports that after surgery he has been feeling depressed, sad, partly due to mobility issues  " could not move the way I used to".  He describes some passive suicidal ideations, but denies any suicidal plan or intention. Endorses some neuro-vegetative symptoms of depression as below. He stresses that he is now feeling better, and feels more optimistic. States " I feel like the strength in my L arm is coming back, and I am walking better."  He is currently future oriented and states he is interested in starting a physical rehab program. Reports that since the  surgery he has been drinking alcohol more heavily. States he has been drinking up to 3-4 40 ounce beers per day. Admission BAL 174, admission UDS negative. Associated Signs/Symptoms: Depression Symptoms:  depressed mood, anhedonia, suicidal thoughts without plan, (Hypo) Manic Symptoms:  None noted or endorsed  Anxiety Symptoms:  Denies  Psychotic Symptoms: denies  PTSD Symptoms: Denies  Total Time spent with patient: 45 minutes  Past Psychiatric History: reports history of depression. History of a suicide attempt by hanging in 1997. Denies history of self cutting or self injurious behaviors.  He has a history of prior psychiatric admissions, most recently in September of 2018- at the time was admitted due to depression, alcohol abuse . He was discharged on Wellbutrin and Trazodone.Denies history of mania, denies history of psychosis, denies history of PTSD . Denies panic or agoraphobia.   Is the patient at risk to self? Yes.    Has the patient been a risk to self in the past 6 months? No.  Has the patient been a risk to self within the distant past? Yes.    Is the patient a risk to others? No.  Has the patient been a risk to others in the past 6 months? No.  Has the patient been a risk to others within the distant past? No.   Prior Inpatient Therapy:  as above  Prior Outpatient Therapy:  had not been following up with outpatient psychiatric management recently   Alcohol Screening: 1. How often do you have a drink containing alcohol?: 4 or more times a week 2. How many drinks containing alcohol do you have on a typical day when you are drinking?: 7, 8, or 9 3. How often do you have six or more drinks  on one occasion?: Daily or almost daily AUDIT-C Score: 11 4. How often during the last year have you found that you were not able to stop drinking once you had started?: Daily or almost daily 5. How often during the last year have you failed to do what was normally expected from you becasue  of drinking?: Daily or almost daily 6. How often during the last year have you needed a first drink in the morning to get yourself going after a heavy drinking session?: Daily or almost daily 7. How often during the last year have you had a feeling of guilt of remorse after drinking?: Daily or almost daily 8. How often during the last year have you been unable to remember what happened the night before because you had been drinking?: Monthly 9. Have you or someone else been injured as a result of your drinking?: Yes, but not in the last year 10. Has a relative or friend or a doctor or another health worker been concerned about your drinking or suggested you cut down?: Yes, during the last year Alcohol Use Disorder Identification Test Final Score (AUDIT): 35 Intervention/Follow-up: Alcohol Education Substance Abuse History in the last 12 months:  Denies drug abuse . History of alcohol use disorder, states he has been drinking daily over the last 3 weeks, states he has been drinking 3 to 4 40 ounce beers per day. States that before this he had been drinking " a lot less ".  Consequences of Substance Abuse: Denies history of seizures or DTs, denies history of blackouts, remote history of DUI. Previous Psychotropic Medications:  Wellbutrin - states he has been on this medication for several years. States " I feel it is the only antidepressant that has worked for me". States that in the past had tried " a bunch of other ones", remembers Remeron, Zoloft, Prozac, states they did not help.  Psychological Evaluations: No  Past Medical History: HIV (+), had elective surgical surgery 2-3 weeks ago. No history of seizures . Past Medical History:  Diagnosis Date  . 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 Hemorrhoids   . HIV (human immunodeficiency virus infection) (HCC)   . Stenosis of cervical spine    withb myelopathy    Past Surgical History:  Procedure Laterality Date  . ANTERIOR CERVICAL DECOMP/DISCECTOMY FUSION  N/A 07/06/2017   Procedure: ANTERIOR CERVICAL DECOMPRESSION/DISCECTOMY FUSION C3-4/C4-5 2 LEVELS;  Surgeon: Lisbeth RenshawNundkumar, Neelesh, MD;  Location: [facility] OR;  Service: Neurosurgery;  Laterality: N/A;  . HERNIA REPAIR  97   lt ing  . RECTAL EXAM UNDER ANESTHESIA N/A 04/24/2014   Procedure:  ligation of bleeding vessels;  Surgeon: Almond LintFaera Byerly, MD;  Location: [facility];  Service: General;  Laterality: N/A;  . TENDON REPAIR     2006-lt index  . WART FULGURATION  07/06/2011   Procedure: FULGURATION ANAL WART;  Surgeon: Shelly Rubensteinouglas A Blackman, MD;  Location: [facility];  Service: General;  Laterality: N/A;  excision anal condyloma  . WART FULGURATION N/A 04/23/2014   Procedure: EXCISION OF ANAL CONDYLOMA;  Surgeon: Abigail Miyamotoouglas Blackman, MD;  Location: [facility];  Service: General;  Laterality: N/A;   Family History: both parents deceased, mother died from brain lymphoma, father died 3 years of unknown causes ,sister deceased ( from brain cancer ) . Has one brother and one surviving sister  Family History  Problem Relation Age of Onset  . Cancer Mother        brain  . Cancer Sister  breast   Family Psychiatric  History: denies history of mental illness in family, father was alcoholic, and reports history of alcohol use disorder in extended family, no suicides in family  Tobacco Screening: smokes 1/2 PPD Social History: 57 year old single male, no children, lives alone, has two dogs, retired. Social History   Substance and Sexual Activity  Alcohol Use Yes  . Alcohol/week: 4.2 - 4.8 oz  . Types: 7 - 8 Cans of beer per week   Comment: a case every 3 to 4 days     Social History   Substance and Sexual Activity  Drug Use No    Additional Social History:  Allergies:   Allergies  Allergen Reactions  . Shellfish Allergy Anaphylaxis and Swelling    Swelling everywhere  . Sulfa Antibiotics Anaphylaxis, Hives, Itching and Swelling    Swelling all over   . Fish Allergy Swelling   Lab  Results: No results found for this or any previous visit (from the past 48 hour(s)).  Blood Alcohol level:  Lab Results  Component Value Date   ETH 173 (H) 07/31/2017   ETH 175 (H) 07/19/2017    Metabolic Disorder Labs:  Lab Results  Component Value Date   HGBA1C 6.0 (H) 06/27/2017   MPG 125.5 06/27/2017   No results found for: PROLACTIN Lab Results  Component Value Date   CHOL 207 (H) 02/26/2015   TRIG 154 (H) 02/26/2015   HDL 97 02/26/2015   CHOLHDL 2.1 02/26/2015   VLDL 31 (H) 02/26/2015   LDLCALC 79 02/26/2015   LDLCALC 78 04/16/2013    Current Medications: Current Facility-Administered Medications  Medication Dose Route Frequency Provider Last Rate Last Dose  . acetaminophen (TYLENOL) tablet 650 mg  650 mg Oral Q4H PRN Charm Rings, NP   650 mg at 08/02/17 0750  . alum & mag hydroxide-simeth (MAALOX/MYLANTA) 200-200-20 MG/5ML suspension 30 mL  30 mL Oral Q4H PRN Charm Rings, NP      . buPROPion (WELLBUTRIN XL) 24 hr tablet 150 mg  150 mg Oral Daily Charm Rings, NP   150 mg at 08/02/17 0745  . darunavir-cobicistat (PREZCOBIX) 800-150 MG per tablet 1 tablet  1 tablet Oral Q breakfast Charm Rings, NP   1 tablet at 08/02/17 0745  . diphenhydrAMINE (BENADRYL) capsule 100 mg  100 mg Oral QHS PRN Nira Conn A, NP   100 mg at 08/01/17 2223  . emtricitabine-tenofovir AF (DESCOVY) 200-25 MG per tablet 1 tablet  1 tablet Oral Daily Charm Rings, NP   1 tablet at 08/02/17 0745  . hydrOXYzine (ATARAX/VISTARIL) tablet 25 mg  25 mg Oral Q6H PRN Nira Conn A, NP      . ibuprofen (ADVIL,MOTRIN) tablet 800 mg  800 mg Oral Q6H PRN Charm Rings, NP   800 mg at 08/02/17 0751  . loperamide (IMODIUM) capsule 2-4 mg  2-4 mg Oral PRN Nira Conn A, NP      . LORazepam (ATIVAN) tablet 1 mg  1 mg Oral Q6H PRN Nira Conn A, NP      . LORazepam (ATIVAN) tablet 1 mg  1 mg Oral TID Jackelyn Poling, NP       Followed by  . [START ON 08/03/2017] LORazepam (ATIVAN) tablet 1 mg   1 mg Oral BID Jackelyn Poling, NP       Followed by  . [START ON 08/05/2017] LORazepam (ATIVAN) tablet 1 mg  1 mg Oral Daily Jackelyn Poling,  NP      . magnesium hydroxide (MILK OF MAGNESIA) suspension 30 mL  30 mL Oral Daily PRN Charm Rings, NP      . methocarbamol (ROBAXIN) tablet 750 mg  750 mg Oral TID PRN Nira Conn A, NP   750 mg at 08/02/17 0750  . multivitamin with minerals tablet 1 tablet  1 tablet Oral Daily Nira Conn A, NP   1 tablet at 08/02/17 0745  . ondansetron (ZOFRAN-ODT) disintegrating tablet 4 mg  4 mg Oral Q6H PRN Nira Conn A, NP      . thiamine (VITAMIN B-1) tablet 100 mg  100 mg Oral Daily Nira Conn A, NP   100 mg at 08/02/17 0744   PTA Medications: Medications Prior to Admission  Medication Sig Dispense Refill Last Dose  . acetaminophen (TYLENOL) 325 MG tablet Take 2 tablets (650 mg total) by mouth every 6 (six) hours as needed for mild pain (or Fever >/= 101). (Patient not taking: Reported on 07/08/2017)   Not Taking at Unknown time  . buPROPion (WELLBUTRIN XL) 300 MG 24 hr tablet Take 1 tablet (300 mg total) by mouth every morning. For depression 10 tablet 0 07/30/2017 at Unknown time  . darunavir-cobicistat (PREZCOBIX) 800-150 MG tablet Take 1 tablet by mouth daily. Swallow whole. Do NOT crush, break or chew tablets. Take with food, take with Descovy. 30 tablet 5 07/30/2017 at Unknown time  . diphenhydrAMINE (BENADRYL) 50 MG tablet Take 100 mg by mouth at bedtime as needed for sleep.   Past Week at Unknown time  . emtricitabine-tenofovir AF (DESCOVY) 200-25 MG tablet Take 1 tablet by mouth daily. Take with Prezcobix. 30 tablet 5 07/30/2017 at Unknown time  . folic acid (FOLVITE) 1 MG tablet Take 1 tablet (1 mg total) by mouth daily. (Patient not taking: Reported on 07/08/2017) 30 tablet 0 Not Taking at Unknown time  . ibuprofen (ADVIL,MOTRIN) 200 MG tablet Take 800 mg by mouth every 6 (six) hours as needed for moderate pain.   Past Week at Unknown time  .  methocarbamol (ROBAXIN-750) 750 MG tablet Take 1 tablet (750 mg total) by mouth 3 (three) times daily as needed for muscle spasms. (Patient not taking: Reported on 07/31/2017) 90 tablet 1 Not Taking at Unknown time  . methylPREDNISolone (MEDROL) 4 MG TBPK tablet Take according to package insert (Patient not taking: Reported on 07/19/2017) 21 tablet 0 Completed Course at Unknown time  . thiamine 100 MG tablet Take 1 tablet (100 mg total) by mouth daily. (Patient not taking: Reported on 07/31/2017) 30 tablet 0 Not Taking at Unknown time    Musculoskeletal: Strength & Muscle Tone: within normal limits Gait & Station: normal- walks slowly with cane , states gait has improved since surgery  Patient leans: N/A  Psychiatric Specialty Exam: Physical Exam  Review of Systems  Constitutional: Negative.   HENT: Negative.   Eyes: Negative.   Respiratory: Negative.   Cardiovascular: Negative.   Gastrointestinal: Negative.  Negative for diarrhea, nausea and vomiting.  Genitourinary: Negative.   Musculoskeletal: Positive for neck pain.       Neck pain related to S/P surgery. Patient has a cervical mass on R aspect of neck- states he has had this for several years, has been told it is benign, and that it is not associated to recent surgery    Skin: Negative.   Neurological: Negative for seizures and headaches.  Endo/Heme/Allergies: Negative.   Psychiatric/Behavioral: Positive for depression, substance abuse and suicidal ideas.  Blood pressure (!) 143/114, pulse 94, temperature 97.6 F (36.4 C), temperature source Oral, resp. rate 16, height 6' (1.829 m), weight 78 kg (172 lb), SpO2 100 %.Body mass index is 23.33 kg/m.  Repeat BP 6,15 PM 150/97  General Appearance: Well Groomed  Eye Contact:  Good  Speech:  Normal Rate  Volume:  Normal  Mood:  reports improving mood , states " I am feeling a lot better today"  Affect:  mildly constricted, but reactive, smiles at times appropriately  Thought  Process:  Linear and Descriptions of Associations: Intact  Orientation:  Other:  fully alert and attentive  Thought Content:  no hallucinations, no delusions, not internally preoccupied  Suicidal Thoughts:  No denies suicidal or self injurious ideations, denies homicidal ideations, contracts for safety  Homicidal Thoughts:  No  Memory:  recent and remote grossly intact   Judgement:  Fair  Insight:  Fair  Psychomotor Activity:  Normal- no tremors, no diaphoresis, no psychomotor agitation at this time  Concentration:  Concentration: Good and Attention Span: Good  Recall:  Good  Fund of Knowledge:  Good  Language:  Good  Akathisia:  Negative  Handed:  Right  AIMS (if indicated):     Assets:  Communication Skills Desire for Improvement Resilience  ADL's:  Intact  Cognition:  WNL  Sleep:  Number of Hours: 5.5    Treatment Plan Summary: Daily contact with patient to assess and evaluate symptoms and progress in treatment, Medication management, Plan inpatient treatment  and medications as below  Observation Level/Precautions:  15 minute checks  Laboratory:  as needed   Psychotherapy:  Milieu, group therapy   Medications:  Patient reports he has been on Wellbutrin for several years with good response, no side effects, wants to continue taking it . As noted, denies any history of seizures . (He is aware that Wellbutrin can lower seizure threshold- of note, he has continued to take it regularly , including since he stopped drinking 3 days ago without side effects, and is not currently presenting with symptoms of WDL,  so risk of it lowering seizure threshold in the context of alcohol WDL is currently decreased . States he does not do well off Wellbutrin.)  Continue Wellbutrin XL 150 mgrs QDAY  He takes Deskovy, Prescobix for HIV management Patient is not currently presenting with symptoms of alcohol WDL, and does not report history of severe WDL in the past . Last alcohol consumption 2-3 days  ago. Discontinue Standing Ativan detox protocol. Will switch from standing to PRN Ativan CIWA protocol. Patient reports significant neck pain following surgery, which has slowly improved but remains significant . Had been prescribed Oxycodone Q 6 hours for pain, denies side effects. Based on severity of pain and documented history of neck pain, recent cervical surgery will continue Oxycodone PRN for pain as needed    Consultations: as needed   Discharge Concerns:-    Estimated LOS: 3-4 days   Other:     Physician Treatment Plan for Primary Diagnosis:  MDD, consider Alcohol Induced Mood Disorder  Long Term Goal(s): Improvement in symptoms so as ready for discharge  Short Term Goals: Ability to identify changes in lifestyle to reduce recurrence of condition will improve and Ability to maintain clinical measurements within normal limits will improve  Physician Treatment Plan for Secondary Diagnosis:  Alcohol Use Disorder  Long Term Goal(s): Improvement in symptoms so as ready for discharge  Short Term Goals: Ability to identify triggers associated with substance abuse/mental health  issues will improve  I certify that inpatient services furnished can reasonably be expected to improve the patient's condition.    Craige Cotta, MD 7/25/20191:32 PM

## 2017-08-03 MED ORDER — HYDROXYZINE HCL 25 MG PO TABS: 25.0000 mg | ORAL_TABLET | Freq: Four times a day (QID) | ORAL | 0 refills | Status: DC | PRN

## 2017-08-03 MED ORDER — BUPROPION HCL ER (XL) 150 MG PO TB24: 150.0000 mg | ORAL_TABLET | Freq: Every day | ORAL | 0 refills | Status: DC

## 2017-08-03 NOTE — Progress Notes (Signed)
Recreation Therapy Notes  Date: 7.26.19 Time: 0930 Location: [address] Dayroom  Group Topic: Stress Management  Goal Area(s) Addresses:  Patient will verbalize importance of using healthy stress management.  Patient will identify positive emotions associated with healthy stress management.   Behavioral Response: Engaged  Intervention: Stress Management  Activity : Progressive Muscle Relaxation.  LRT introduced the stress management technique of progressive muscle relaxation.  LRT read a script that guided patients through a series of tensing muscles and then relaxing.  Patients were to follow along as LRT read script to engage in the activity.  Education:  Stress Management, Discharge Planning.   Education Outcome: Acknowledges edcuation/In group clarification offered/Needs additional education  Clinical Observations/Feedback: Pt attended and participated in group.    Zygmunt Mcglinn, LRT/CTRS         Yordy Matton A 08/03/2017 10:58 AM 

## 2017-08-03 NOTE — Discharge Summary (Addendum)
Physician Discharge Summary Note  Patient:  [name] is an 57 y.o., male MRN:  [id number]  DOB:  May 08, 1960  Patient phone:  [phone] (home)   Patient address:   [address] [name] [address],   Total Time spent with patient: Greater than 30 minutes  Date of Admission:  08/01/2017 Date of Discharge: 08/03/2017  Reason for Admission: Worsening symptoms of depression.  Principal Problem: <principal problem not specified>  Discharge Diagnoses: Patient Active Problem List   Diagnosis Date Noted  . Major depressive disorder, recurrent severe without psychotic features (HCC) [F33.2] 08/01/2017  . Dysphagia [R13.10] 07/08/2017  . 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 Left-sided weakness [R53.1] 06/27/2017  . Tricompartment osteoarthritis of right knee [M17.11] 03/27/2017  . Alcohol abuse with alcohol-induced mood disorder (HCC) [F10.14] 09/25/2016  . Polysubstance dependence (HCC) [F19.20] 09/25/2016  . Substance use disorder [F19.90] 09/19/2016  . MDD (major depressive disorder), recurrent severe, without psychosis (HCC) [F33.2] 09/18/2016  . 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 HIV disease (HCC) [B20] 11/09/2008   Past Psychiatric History: reports history of depression. History of a suicide attempt by hanging in 1997. Denies history of self cutting or self injurious behaviors.  He has a history of prior psychiatric admissions, most recently in September of 2018- at the time was admitted due to depression, alcohol abuse . He was discharged on Wellbutrin and Trazodone.Denies history of mania, denies history of psychosis, denies history of PTSD . Denies panic or agoraphobia Consequences of Substance Abuse: Denies history of seizures or DTs, denies history of blackouts, remote history of DUI. Previous Psychotropic Medications:  Wellbutrin - states he has been on this medication for several years. States " I feel it is the  only antidepressant that has worked for me". States that in the past had tried " a bunch of other ones", remembers Remeron, Zoloft, Prozac, states they did not help  Past Medical History:  Past Medical History:  Diagnosis Date  . 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 Hemorrhoids   . HIV (human immunodeficiency virus infection) (HCC)   . Stenosis of cervical spine    withb myelopathy    Past Surgical History:  Procedure Laterality Date  . ANTERIOR CERVICAL DECOMP/DISCECTOMY FUSION N/A 07/06/2017   Procedure: ANTERIOR CERVICAL DECOMPRESSION/DISCECTOMY FUSION C3-4/C4-5 2 LEVELS;  Surgeon: [name] Renshaw, MD;  Location: [facility];  Service: Neurosurgery;  Laterality: N/A;  . HERNIA REPAIR  97   lt ing  . RECTAL EXAM UNDER ANESTHESIA N/A 04/24/2014   Procedure:  ligation of bleeding vessels;  Surgeon: [name] Lint, MD;  Location: [facility];  Service: General;  Laterality: N/A;  . TENDON REPAIR     2006-lt index  . WART FULGURATION  07/06/2011   Procedure: FULGURATION ANAL WART;  Surgeon: [name] Rubenstein, MD;  Location: [facility];  Service: General;  Laterality: N/A;  excision anal condyloma  . WART FULGURATION N/A 04/23/2014   Procedure: EXCISION OF ANAL CONDYLOMA;  Surgeon: [name] Miyamoto, MD;  Location: [facility];  Service: General;  Laterality: N/A;   Family History:  Family History  Problem Relation Age of Onset  . Cancer Mother        brain  . Cancer Sister        breast   Family Psychiatric  History: denies history of mental illness in family, father was alcoholic, and reports history of alcohol use disorder in extended family, no suicides in family   Social History:  Social History   Substance and Sexual Activity  Alcohol Use Yes  . Alcohol/week: 4.2 - 4.8 oz  . Types: 7 - 8 Cans of beer per week   Comment: a case every 3 to 4 days     Social History   Substance and Sexual Activity  Drug Use No    Social History   Socioeconomic History  . Marital status: Single     Spouse name: Not on file  . Number of children: Not on file  . Years of education: Not on file  . Highest education level: Not on file  Occupational History  . Not on file  Social Needs  . Financial resource strain: Not on file  . Food insecurity:    Worry: Not on file    Inability: Not on file  . Transportation needs:    Medical: Not on file    Non-medical: Not on file  Tobacco Use  . Smoking status: Current Every Day Smoker    Packs/day: 0.50    Years: 25.00    Pack years: 12.50    Types: Cigarettes  . Smokeless tobacco: Never Used  Substance and Sexual Activity  . Alcohol use: Yes    Alcohol/week: 4.2 - 4.8 oz    Types: 7 - 8 Cans of beer per week    Comment: a case every 3 to 4 days  . Drug use: No  . Sexual activity: Not Currently  Lifestyle  . Physical activity:    Days per week: Not on file    Minutes per session: Not on file  . Stress: Not on file  Relationships  . Social connections:    Talks on phone: Not on file    Gets together: Not on file    Attends religious service: Not on file    Active member of club or organization: Not on file    Attends meetings of clubs or organizations: Not on file    Relationship status: Not on file  Other Topics Concern  . Not on file  Social History Narrative  . Not on file   Hospital Course: (Per SRA): 57 y.o AAM, single, lives alone, unemployed. Background history of depression and chronic medical issues as above. Self presented to the ER seeking help. Relapsed on alcohol and substances in the past month. Has been overwhelmed by his recent surgery and decrease in ADLs. Has been more depressed lately. Was having suicidal thoughts at home. BAL was 174 mg/dl. UDS negative.   After evaluation of his presenting symptoms, [name] was started on the medication regimen purposefully targeting those symptoms. His UDS on admission was negative & a BAL of 174 per toxicology test result. He received Ativan detoxification treatment  protocols for alcohol detox. Cocaine however & as of date has no established detoxification treatment protocols. He was medicated & discharged on; Wellbbutrin XL 150 mg for depression & Trazodone 50 mg for insomnia. [name] presented other significant pre-existing medical issues that required treatment & or monitoring. He was resumed on all his pertinent home medications for those health issues. He tolerated his treatment regimen without any adverse effects or reactions reported. He was also enrolled & participated in the group counseling sessions being offered & held on this unit. He learned coping skills.  Byrant feels he has detoxed completely and wants to get home and get prepared for physical therapy on Monday. He is excited to start therapy as this was his main stressors for his depression. Says he feels good  otherwise. No sweatiness, no headaches. No retching, nausea or vomiting. No fullness in the head. No visual, tactile or auditory hallucination. No internal restlessness. No suicidal or homicidal thoughts. He is now sleeping well. No evidence of mania. No craving for substances. No access to weapons.  The staff reports that he has been participating in some of the group sessions. He is coming out more. Some irritability but easily verbally redirected. This is likely related to psychological cravings. He has not voiced any violent thoughts. He has not expressed any suicidal thoughts. He has not required any PRN lately. His pulse has been stable in the past twenty four hours. Blood pressure is trending down. Unclear if related to substance use vs primary hypertension. Patient would follow up with his PCP if it persists. He has been instructed of this.  . The team members feel that patient is back to his baseline level of function. Team agrees with plan to discharge patient today to continue mental health care & substance abuse treatment on an outpatient basis as noted below. He is provided with all  the necessary information needed to make these appointments without problems. He feft bHH with all personal belongings in no apparent distress. Transportation per his arrangement.  Physical Findings: AIMS: Facial and Oral Movements Muscles of Facial Expression: None, normal Lips and Perioral Area: None, normal Jaw: None, normal Tongue: None, normal,Extremity Movements Upper (arms, wrists, hands, fingers): None, normal Lower (legs, knees, ankles, toes): None, normal, Trunk Movements Neck, shoulders, hips: None, normal, Overall Severity Severity of abnormal movements (highest score from questions above): None, normal Incapacitation due to abnormal movements: None, normal Patient's awareness of abnormal movements (rate only patient's report): No Awareness, Dental Status Current problems with teeth and/or dentures?: No Does patient usually wear dentures?: No  CIWA:  CIWA-Ar Total: 2 COWS:  COWS Total Score: 0  Musculoskeletal: Strength & Muscle Tone: within normal limits Gait & Station: normal Patient leans: N/A  Psychiatric Specialty Exam: Physical Exam  Nursing note and vitals reviewed. Constitutional: He is oriented to person, place, and time. He appears well-developed.  HENT:  Head: Normocephalic.  Eyes: Pupils are equal, round, and reactive to light.  Neck: Normal range of motion.  Respiratory: Effort normal.  GI: Soft.  Genitourinary:  Genitourinary Comments: Deferred  Musculoskeletal: Normal range of motion.  Neurological: He is alert and oriented to person, place, and time.  Skin: Skin is warm.    Review of Systems  Constitutional: Negative.   HENT: Negative.   Eyes: Negative.   Respiratory: Negative.   Cardiovascular: Negative.   Gastrointestinal: Negative.   Genitourinary: Negative.   Musculoskeletal: Negative.   Skin: Negative.   Neurological: Negative.   Endo/Heme/Allergies: Negative.   Psychiatric/Behavioral: Positive for depression (Stable) and substance  abuse (Hx. Cocaine use disorder). Negative for hallucinations, memory loss and suicidal ideas. The patient has insomnia (Stable). The patient is not nervous/anxious.     Blood pressure (!) 146/107, pulse 87, temperature 97.7 F (36.5 C), temperature source Oral, resp. rate 18, height 6' (1.829 m), weight 78 kg (172 lb), SpO2 100 %.Body mass index is 23.33 kg/m.  See Md's SRA   Have you used any form of tobacco in the last 30 days? (Cigarettes, Smokeless Tobacco, Cigars, and/or Pipes): Yes  Has this patient used any form of tobacco in the last 30 days? (Cigarettes, Smokeless Tobacco, Cigars, and/or Pipes): N/A  Blood Alcohol level:  Lab Results  Component Value Date   ETH 173 (H) 07/31/2017  ETH 175 (H) 07/19/2017   Metabolic Disorder Labs:  Lab Results  Component Value Date   HGBA1C 6.0 (H) 06/27/2017   MPG 125.5 06/27/2017   No results found for: PROLACTIN Lab Results  Component Value Date   CHOL 207 (H) 02/26/2015   TRIG 154 (H) 02/26/2015   HDL 97 02/26/2015   CHOLHDL 2.1 02/26/2015   VLDL 31 (H) 02/26/2015   LDLCALC 79 02/26/2015   LDLCALC 78 04/16/2013   See Psychiatric Specialty Exam and Suicide Risk Assessment completed by Attending Physician prior to discharge.  Discharge destination:  Home  Is patient on multiple antipsychotic therapies at discharge:  No   Has Patient had three or more failed trials of antipsychotic monotherapy by history:  No  Recommended Plan for Multiple Antipsychotic Therapies: NA Discharge Instructions    Discharge instructions   Complete by:  As directed    Please continue to take medications as directed. If your symptoms return, worsen, or persist please call your 911, report to local ER, or contact crisis hotline. Please do not drink alcohol or use any illegal substances while taking prescription medications.     Allergies as of 08/04/2017      Reactions   Shellfish Allergy Anaphylaxis, Swelling   Swelling everywhere   Sulfa  Antibiotics Anaphylaxis, Hives, Itching, Swelling   Swelling all over    Fish Allergy Swelling      Medication List    STOP taking these medications   acetaminophen 325 MG tablet Commonly known as:  TYLENOL   diphenhydrAMINE 50 MG tablet Commonly known as:  BENADRYL   folic acid 1 MG tablet Commonly known as:  FOLVITE   ibuprofen 200 MG tablet Commonly known as:  ADVIL,MOTRIN   methocarbamol 750 MG tablet Commonly known as:  ROBAXIN-750   methylPREDNISolone 4 MG Tbpk tablet Commonly known as:  MEDROL   thiamine 100 MG tablet     TAKE these medications     Indication  buPROPion 150 MG 24 hr tablet Commonly known as:  WELLBUTRIN XL Take 1 tablet (150 mg total) by mouth daily. What changed:    medication strength  how much to take  when to take this  additional instructions  Indication:  Major Depressive Disorder   darunavir-cobicistat 800-150 MG tablet Commonly known as:  PREZCOBIX Take 1 tablet by mouth daily. Swallow whole. Do NOT crush, break or chew tablets. Take with food, take with Descovy.  Indication:  HIV Disease   emtricitabine-tenofovir AF 200-25 MG tablet Commonly known as:  DESCOVY Take 1 tablet by mouth daily. Take with Prezcobix.  Indication:  HIV Disease   hydrOXYzine 25 MG tablet Commonly known as:  ATARAX/VISTARIL Take 1 tablet (25 mg total) by mouth every 6 (six) hours as needed (anxiety/agitation or CIWA < or = 10).  Indication:  Feeling Anxious, Tension      Follow-up Information    [facility] Follow up on 08/10/2017.   Specialty:  Behavioral Health Why:  Hospital follow-up on Friday, 8/2 at 8:00AM. Please bring: Photo ID, insurance card(s), hospital discharge paperwork to this appointment. Thank you.  Contact informationElpidio Eric: [address] [place] [address] [phone]          Follow-up recommendations: Activity:  As tolerated Diet: As recommended by your primary care doctor. Keep all scheduled follow-up appointments as  recommended.  Comments: Patient is instructed prior to discharge to: Take all medications as prescribed by his/her mental healthcare provider. Report any adverse effects and or reactions from the  medicines to his/her outpatient provider promptly. Patient has been instructed & cautioned: To not engage in alcohol and or illegal drug use while on prescription medicines. In the event of worsening symptoms, patient is instructed to call the crisis hotline, 911 and or go to the nearest ED for appropriate evaluation and treatment of symptoms. To follow-up with his/her primary care provider for your other medical issues, concerns and or health care needs.   Signed: Denzil Magnuson, NP,  FNP-BC 08/04/2017, 11:17 AM   Patient seen, Suicide Assessment Completed.  Disposition Plan Reviewed

## 2017-08-03 NOTE — Progress Notes (Signed)
D: Patient denies SI, HI or AVH this morning. Patient presents with a slightly depressed mood but is pleasant and cooperative.  Pt. States that he is feeling better and is ready for discharge.  Pt. States that he slept well and appetite is good.  He rates his depression as 1/10 and anxiety 0/10.  Pt. Is visualized on the unit interacting with staff and others.  A: Patient given emotional support from RN. Patient encouraged to come to staff with concerns and/or questions. Patient's medication routine continued. Patient's orders and plan of care reviewed.   R: Patient remains appropriate and cooperative. Will continue to monitor patient q15 minutes for safety.

## 2017-08-03 NOTE — Tx Team (Signed)
Interdisciplinary Treatment and Diagnostic Plan Update  08/03/2017 Time of Session: [id number] [name] MRN: [id number]  Principal Diagnosis: MDD, recurrent, severe, without psychotic features Secondary Diagnoses: Active Problems:   Major depressive disorder, recurrent severe without psychotic features (HCC)   Current Medications:  Current Facility-Administered Medications  Medication Dose Route Frequency Provider Last Rate Last Dose  . acetaminophen (TYLENOL) tablet 650 mg  650 mg Oral Q4H PRN Charm Rings, NP   650 mg at 08/02/17 0750  . alum & mag hydroxide-simeth (MAALOX/MYLANTA) 200-200-20 MG/5ML suspension 30 mL  30 mL Oral Q4H PRN Charm Rings, NP      . buPROPion (WELLBUTRIN XL) 24 hr tablet 150 mg  150 mg Oral Daily Charm Rings, NP   150 mg at 08/03/17 0744  . darunavir-cobicistat (PREZCOBIX) 800-150 MG per tablet 1 tablet  1 tablet Oral Q breakfast Charm Rings, NP   1 tablet at 08/03/17 0744  . emtricitabine-tenofovir AF (DESCOVY) 200-25 MG per tablet 1 tablet  1 tablet Oral Daily Charm Rings, NP   1 tablet at 08/03/17 0744  . hydrOXYzine (ATARAX/VISTARIL) tablet 25 mg  25 mg Oral Q6H PRN Nira Conn A, NP   25 mg at 08/02/17 2216  . ibuprofen (ADVIL,MOTRIN) tablet 800 mg  800 mg Oral Q6H PRN Charm Rings, NP   800 mg at 08/03/17 0747  . loperamide (IMODIUM) capsule 2-4 mg  2-4 mg Oral PRN Nira Conn A, NP      . LORazepam (ATIVAN) tablet 1 mg  1 mg Oral Q6H PRN Nira Conn A, NP   1 mg at 08/02/17 2216  . multivitamin with minerals tablet 1 tablet  1 tablet Oral Daily Nira Conn A, NP   1 tablet at 08/03/17 0744  . ondansetron (ZOFRAN-ODT) disintegrating tablet 4 mg  4 mg Oral Q6H PRN Nira Conn A, NP      . oxyCODONE-acetaminophen (PERCOCET/ROXICET) 5-325 MG per tablet 1 tablet  1 tablet Oral Q8H PRN Cobos, Rockey Situ, MD   1 tablet at 08/03/17 0747  . thiamine (VITAMIN B-1) tablet 100 mg  100 mg Oral Daily Nira Conn A, NP   100 mg at 08/03/17  0744   PTA Medications: Medications Prior to Admission  Medication Sig Dispense Refill Last Dose  . acetaminophen (TYLENOL) 325 MG tablet Take 2 tablets (650 mg total) by mouth every 6 (six) hours as needed for mild pain (or Fever >/= 101). (Patient not taking: Reported on 07/08/2017)   Not Taking at Unknown time  . buPROPion (WELLBUTRIN XL) 300 MG 24 hr tablet Take 1 tablet (300 mg total) by mouth every morning. For depression 10 tablet 0 07/30/2017 at Unknown time  . darunavir-cobicistat (PREZCOBIX) 800-150 MG tablet Take 1 tablet by mouth daily. Swallow whole. Do NOT crush, break or chew tablets. Take with food, take with Descovy. 30 tablet 5 07/30/2017 at Unknown time  . diphenhydrAMINE (BENADRYL) 50 MG tablet Take 100 mg by mouth at bedtime as needed for sleep.   Past Week at Unknown time  . emtricitabine-tenofovir AF (DESCOVY) 200-25 MG tablet Take 1 tablet by mouth daily. Take with Prezcobix. 30 tablet 5 07/30/2017 at Unknown time  . folic acid (FOLVITE) 1 MG tablet Take 1 tablet (1 mg total) by mouth daily. (Patient not taking: Reported on 07/08/2017) 30 tablet 0 Not Taking at Unknown time  . ibuprofen (ADVIL,MOTRIN) 200 MG tablet Take 800 mg by mouth every 6 (six) hours as needed for moderate pain.  Past Week at Unknown time  . methocarbamol (ROBAXIN-750) 750 MG tablet Take 1 tablet (750 mg total) by mouth 3 (three) times daily as needed for muscle spasms. (Patient not taking: Reported on 07/31/2017) 90 tablet 1 Not Taking at Unknown time  . methylPREDNISolone (MEDROL) 4 MG TBPK tablet Take according to package insert (Patient not taking: Reported on 07/19/2017) 21 tablet 0 Completed Course at Unknown time  . thiamine 100 MG tablet Take 1 tablet (100 mg total) by mouth daily. (Patient not taking: Reported on 07/31/2017) 30 tablet 0 Not Taking at Unknown time    Patient Stressors: Health problems Medication change or noncompliance Substance abuse  Patient Strengths: Ability for insight Average  or above average intelligence Capable of independent living General fund of knowledge Motivation for treatment/growth  Treatment Modalities: Medication Management, Group therapy, Case management,  1 to 1 session with clinician, Psychoeducation, Recreational therapy.   Physician Treatment Plan for Primary Diagnosis:  MDD, recurrent, severe, without psychotic features Long Term Goal(s): Improvement in symptoms so as ready for discharge Improvement in symptoms so as ready for discharge   Short Term Goals: Ability to identify changes in lifestyle to reduce recurrence of condition will improve Ability to maintain clinical measurements within normal limits will improve Ability to identify triggers associated with substance abuse/mental health issues will improve  Medication Management: Evaluate patient's response, side effects, and tolerance of medication regimen.  Therapeutic Interventions: 1 to 1 sessions, Unit Group sessions and Medication administration.  Evaluation of Outcomes: Progressing  Physician Treatment Plan for Secondary Diagnosis: Active Problems:   Major depressive disorder, recurrent severe without psychotic features (HCC)  Long Term Goal(s): Improvement in symptoms so as ready for discharge Improvement in symptoms so as ready for discharge   Short Term Goals: Ability to identify changes in lifestyle to reduce recurrence of condition will improve Ability to maintain clinical measurements within normal limits will improve Ability to identify triggers associated with substance abuse/mental health issues will improve     Medication Management: Evaluate patient's response, side effects, and tolerance of medication regimen.  Therapeutic Interventions: 1 to 1 sessions, Unit Group sessions and Medication administration.  Evaluation of Outcomes: Progressing   RN Treatment Plan for Primary Diagnosis:  MDD, recurrent, severe, without psychotic features Long Term Goal(s):  Knowledge of disease and therapeutic regimen to maintain health will improve  Short Term Goals: Ability to remain free from injury will improve and Ability to disclose and discuss suicidal ideas  Medication Management: RN will administer medications as ordered by provider, will assess and evaluate patient's response and provide education to patient for prescribed medication. RN will report any adverse and/or side effects to prescribing provider.  Therapeutic Interventions: 1 on 1 counseling sessions, Psychoeducation, Medication administration, Evaluate responses to treatment, Monitor vital signs and CBGs as ordered, Perform/monitor CIWA, COWS, AIMS and Fall Risk screenings as ordered, Perform wound care treatments as ordered.  Evaluation of Outcomes: Progressing   LCSW Treatment Plan for Primary Diagnosis:  MDD, recurrent, severe, without psychotic features Long Term Goal(s): Safe transition to appropriate next level of care at discharge, Engage patient in therapeutic group addressing interpersonal concerns.  Short Term Goals: Engage patient in aftercare planning with referrals and resources, Facilitate patient progression through stages of change regarding substance use diagnoses and concerns and Identify triggers associated with mental health/substance abuse issues  Therapeutic Interventions: Assess for all discharge needs, 1 to 1 time with Social worker, Explore available resources and support systems, Assess for adequacy in community support  network, Educate family and significant other(s) on suicide prevention, Complete Psychosocial Assessment, Interpersonal group therapy.  Evaluation of Outcomes: Progressing   Progress in Treatment: Attending groups: Yes. Participating in groups: Yes. Taking medication as prescribed: Yes. Toleration medication: Yes. Family/Significant other contact made: Yes, individual(s) contacted:  pt's sister for collateral information and to complete  SPE. Patient understands diagnosis: Yes. Discussing patient identified problems/goals with staff: Yes. Medical problems stabilized or resolved: Yes. Denies suicidal/homicidal ideation: Yes. Issues/concerns per patient self-inventory: No. Other: n/a   New problem(s) identified: No, Describe:  n/a  New Short Term/Long Term Goal(s): detox, medication management for mood stabilization; elimination of SI thoughts; development of comprehensive mental wellness/sobriety plan.   Patient Goals:  "To start managing my stress better and drinking less."   Discharge Plan or Barriers: Pt plans to discharge home and will follow-up with [facility]. MHAG pamphlet, Mobile Crisis information, and AA/NA information provided to patient for additional community support and resources.   Reason for Continuation of Hospitalization: Anxiety Depression Medication stabilization Withdrawal symptoms  Estimated Length of Stay: Saturday, 08/04/17  Attendees: Patient: 08/03/2017 9:20 AM  Physician: Dr. Jama Flavorsobos MD; Dr. Viviano SimasMaurer MD 08/03/2017 9:20 AM  Nursing: Arlyss RepressAlyssa RN; Erika RN 08/03/2017 9:20 AM  RN Care Manager:x 08/03/2017 9:20 AM  Social Worker: Corrie MckusickHeather Willies Laviolette LCSW 08/03/2017 9:20 AM  Recreational Therapist: x 08/03/2017 9:20 AM  Other: Armandina StammerAgnes Nwoko NP; Gilda Creaseakia Starke NP 08/03/2017 9:20 AM  Other:  08/03/2017 9:20 AM  Other: 08/03/2017 9:20 AM    Scribe for Treatment Team: Rona RavensHeather S Coretta Leisey, LCSW 08/03/2017 9:20 AM

## 2017-08-03 NOTE — [facility] Group Notes (Signed)
LCSW Group Therapy Note  08/03/2017 1:15pm  Type of Therapy and Topic:  Group Therapy:  Feelings around Relapse and Recovery  Participation Level:  Did Not Attend--pt invited. Chose to remain in room.    Description of Group:    Patients in this group will discuss emotions they experience before and after a relapse. They will process how experiencing these feelings, or avoidance of experiencing them, relates to having a relapse. Facilitator will guide patients to explore emotions they have related to recovery. Patients will be encouraged to process which emotions are more powerful. They will be guided to discuss the emotional reaction significant others in their lives may have to their relapse or recovery. Patients will be assisted in exploring ways to respond to the emotions of others without this contributing to a relapse.  Therapeutic Goals: 1. Patient will identify two or more emotions that lead to a relapse for them 2. Patient will identify two emotions that result when they relapse 3. Patient will identify two emotions related to recovery 4. Patient will demonstrate ability to communicate their needs through discussion and/or role plays   Summary of Patient Progress:   x  Therapeutic Modalities:   Cognitive Behavioral Therapy Solution-Focused Therapy Assertiveness Training Relapse Prevention Therapy   Rona RavensHeather S Laron Boorman, LCSW 08/03/2017 11:39 AM

## 2017-08-03 NOTE — Progress Notes (Signed)
  [facility]  Will you be returning to the same living situation after discharge:  Yes,  home At discharge, do you have transportation home?: Yes,  bus--Pt is scheduled for discharge on Saturday, 07/2717 per Dr. Jama Flavorsobos. Do you have the ability to pay for your medications: Yes,  Medicare  Release of information consent forms completed and submitted to medical records by CSW.  Patient to Follow up at: Follow-up Information    [facility] Follow up on 08/10/2017.   Specialty:  Behavioral Health Why:  Hospital follow-up on Friday, 8/2 at 8:00AM. Please bring: Photo ID, insurance card(s), hospital discharge paperwork to this appointment. Thank you.  Contact information: [address] [place] [address] [phone]           Next level of care provider [facility]:no  Safety Planning and Suicide Prevention discussed: Yes,  SPE completed with pt's sister and with pt. SPI pamphlet and Mobile Crisis information provided to pt.   Have you used any form of tobacco in the last 30 days? (Cigarettes, Smokeless Tobacco, Cigars, and/or Pipes): Yes  Has patient been referred to the Quitline?: Patient refused referral  Patient has been referred for addiction treatment: Yes  Rona RavensHeather S Lasaro Primm, LCSW 08/03/2017, 9:30 AM

## 2017-08-03 NOTE — Progress Notes (Signed)
D   Pt is appropriate and pleasant   He is handling his cancelled discharge well and said he will be discharged tomorrow and feels ready to go    He interacts well with others and is compliant with treatment A   Verbal support given   Medications administered and effectiveness monitored   Q 15 min checks R   Pt is safe at present time

## 2017-08-03 NOTE — Progress Notes (Addendum)
[facility] MD Progress Note  08/03/2017 2:56 PM [name]  MRN:  [id number] Subjective:     Objective: 57 y.o AAM, single, lives alone, unemployed. Background history of depression and chronic medical issues as above. Self presented to the ER seeking help. Relapsed on alcohol in the past month. Has been overwhelmed by his recent surgery and decreased in ADLs. Has been more depressed lately. Was having suicidal thoughts at home.   Chart reviewed today. Patient discussed at team today.  Staff reports that he had been isolating self until this morning. He groomed self this morning and has been coming out more. He has repeatedly denied any abnormal perception. He denies any suicidal thoughts today. He has been referred for physical therapy.   Seen today. Was out at the dayroom interacting with peers. Tells me that he feels better now. Says he has no desire to kill himself. States that he has been dealing with addiction for many years and knows when to come in for help.He is exciting about starting therapy at this time. He is sleeping and eating with no disturbances. He is also able to identify some coping skills that include walking, reading, and exercise. He is able to identify his support system to include his sister Nettie Elm.  Patient denies violent thoughts. Denies any homicidal thoughts. Denies any abnormal perception. No delusional preoccupation. Patient denies any craving for substances. He states his goal today is to attend groups.  Principal Problem: <principal problem not specified>                                   Diagnosis:   Patient Active Problem List   Diagnosis Date Noted  . Major depressive disorder, recurrent severe without psychotic features (HCC) [F33.2] 08/01/2017  . Dysphagia [R13.10] 07/08/2017  . 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 Left-sided weakness [R53.1] 06/27/2017  . 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 Alcohol abuse with alcohol-induced mood disorder (HCC) [F10.14] 09/25/2016  . Polysubstance dependence (HCC) [F19.20] 09/25/2016  . Substance use disorder [F19.90] 09/19/2016  . MDD (major depressive disorder), recurrent severe, without psychosis (HCC) [F33.2] 09/18/2016  . 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 HIV disease (HCC) [B20] 11/09/2008   Total Time spent with patient: 20 minutes  Past Psychiatric History: As in H&P  Past Medical History:  Past Medical History:  Diagnosis Date  . 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 Hemorrhoids   . HIV (human immunodeficiency virus infection) (HCC)   . Stenosis of cervical spine    withb myelopathy    Past Surgical History:  Procedure Laterality Date  . ANTERIOR CERVICAL DECOMP/DISCECTOMY FUSION N/A 07/06/2017   Procedure: ANTERIOR CERVICAL DECOMPRESSION/DISCECTOMY FUSION C3-4/C4-5 2 LEVELS;  Surgeon: Lisbeth Renshaw, MD;  Location: [facility] OR;  Service: Neurosurgery;  Laterality: N/A;  . HERNIA REPAIR  97   lt ing  . RECTAL EXAM UNDER ANESTHESIA N/A 04/24/2014   Procedure:  ligation of bleeding vessels;  Surgeon: Almond Lint, MD;  Location: [facility] ORS;  Service: General;  Laterality: N/A;  . TENDON REPAIR     2006-lt index  . WART FULGURATION  07/06/2011   Procedure: FULGURATION ANAL WART;  Surgeon: Shelly Rubenstein, MD;  Location: [facility];  Service: General;  Laterality: N/A;  excision anal condyloma  . WART FULGURATION N/A 04/23/2014   Procedure: EXCISION OF ANAL CONDYLOMA;  Surgeon: Abigail Miyamoto, MD;  Location: [facility] ORS;  Service: General;  Laterality: N/A;   Family History:  Family History  Problem Relation Age of Onset  . Cancer Mother        brain  . Cancer Sister        breast   Family Psychiatric  History: As in H&P Social History:  Social History   Substance and Sexual Activity  Alcohol Use Yes  . Alcohol/week: 4.2 - 4.8 oz  . Types: 7 - 8 Cans of beer per week   Comment: a case every 3 to 4 days      Social History   Substance and Sexual Activity  Drug Use No    Social History   Socioeconomic History  . Marital status: Single    Spouse name: Not on file  . Number of children: Not on file  . Years of education: Not on file  . Highest education level: Not on file  Occupational History  . Not on file  Social Needs  . Financial resource strain: Not on file  . Food insecurity:    Worry: Not on file    Inability: Not on file  . Transportation needs:    Medical: Not on file    Non-medical: Not on file  Tobacco Use  . Smoking status: Current Every Day Smoker    Packs/day: 0.50    Years: 25.00    Pack years: 12.50    Types: Cigarettes  . Smokeless tobacco: Never Used  Substance and Sexual Activity  . Alcohol use: Yes    Alcohol/week: 4.2 - 4.8 oz    Types: 7 - 8 Cans of beer per week    Comment: a case every 3 to 4 days  . Drug use: No  . Sexual activity: Not Currently  Lifestyle  . Physical activity:    Days per week: Not on file    Minutes per session: Not on file  . Stress: Not on file  Relationships  . Social connections:    Talks on phone: Not on file    Gets together: Not on file    Attends religious service: Not on file    Active member of club or organization: Not on file    Attends meetings of clubs or organizations: Not on file    Relationship status: Not on file  Other Topics Concern  . Not on file  Social History Narrative  . Not on file   Additional Social History:      Sleep: Fair  Appetite:  Fair  Current Medications: Current Facility-Administered Medications  Medication Dose Route Frequency Provider Last Rate Last Dose  . acetaminophen (TYLENOL) tablet 650 mg  650 mg Oral Q4H PRN Charm Rings, NP   650 mg at 08/02/17 0750  . alum & mag hydroxide-simeth (MAALOX/MYLANTA) 200-200-20 MG/5ML suspension 30 mL  30 mL Oral Q4H PRN Charm Rings, NP      . buPROPion (WELLBUTRIN XL) 24 hr tablet 150 mg  150 mg Oral Daily Charm Rings,  NP   150 mg at 08/03/17 0744  . darunavir-cobicistat (PREZCOBIX) 800-150 MG per tablet 1 tablet  1 tablet Oral Q breakfast Charm Rings, NP   1 tablet at 08/03/17 0744  . emtricitabine-tenofovir AF (DESCOVY) 200-25 MG per tablet 1 tablet  1 tablet Oral Daily Charm Rings, NP   1 tablet at 08/03/17 0744  . hydrOXYzine (ATARAX/VISTARIL) tablet 25 mg  25 mg Oral Q6H PRN Jackelyn Poling, NP  25 mg at 08/02/17 2216  . ibuprofen (ADVIL,MOTRIN) tablet 800 mg  800 mg Oral Q6H PRN Charm Rings, NP   800 mg at 08/03/17 0747  . loperamide (IMODIUM) capsule 2-4 mg  2-4 mg Oral PRN Nira Conn A, NP      . LORazepam (ATIVAN) tablet 1 mg  1 mg Oral Q6H PRN Nira Conn A, NP   1 mg at 08/02/17 2216  . multivitamin with minerals tablet 1 tablet  1 tablet Oral Daily Nira Conn A, NP   1 tablet at 08/03/17 0744  . ondansetron (ZOFRAN-ODT) disintegrating tablet 4 mg  4 mg Oral Q6H PRN Nira Conn A, NP      . oxyCODONE-acetaminophen (PERCOCET/ROXICET) 5-325 MG per tablet 1 tablet  1 tablet Oral Q8H PRN Lovell Nuttall, Rockey Situ, MD   1 tablet at 08/03/17 0747  . thiamine (VITAMIN B-1) tablet 100 mg  100 mg Oral Daily Nira Conn A, NP   100 mg at 08/03/17 4132    Lab Results: No results found for this or any previous visit (from the past 48 hour(s)).  Blood Alcohol level:  Lab Results  Component Value Date   ETH 173 (H) 07/31/2017   ETH 175 (H) 07/19/2017    Metabolic Disorder Labs: Lab Results  Component Value Date   HGBA1C 6.0 (H) 06/27/2017   MPG 125.5 06/27/2017   No results found for: PROLACTIN Lab Results  Component Value Date   CHOL 207 (H) 02/26/2015   TRIG 154 (H) 02/26/2015   HDL 97 02/26/2015   CHOLHDL 2.1 02/26/2015   VLDL 31 (H) 02/26/2015   LDLCALC 79 02/26/2015   LDLCALC 78 04/16/2013    Physical Findings: AIMS: Facial and Oral Movements Muscles of Facial Expression: None, normal Lips and Perioral Area: None, normal Jaw: None, normal Tongue: None, normal,Extremity  Movements Upper (arms, wrists, hands, fingers): None, normal Lower (legs, knees, ankles, toes): None, normal, Trunk Movements Neck, shoulders, hips: None, normal, Overall Severity Severity of abnormal movements (highest score from questions above): None, normal Incapacitation due to abnormal movements: None, normal Patient's awareness of abnormal movements (rate only patient's report): No Awareness, Dental Status Current problems with teeth and/or dentures?: No Does patient usually wear dentures?: No  CIWA:  CIWA-Ar Total: 1 COWS:     Musculoskeletal: Strength & Muscle Tone: within normal limits Gait & Station: normal Patient leans: N/A  Psychiatric Specialty Exam: Physical Exam  Nursing note and vitals reviewed. Constitutional: He appears well-developed and well-nourished.  HENT:  Poor dentition  Respiratory: Effort normal.  Musculoskeletal:  Ambulating with a cane  Neurological: He is alert.  Psychiatric:  As above    ROS   Blood pressure (!) 149/98, pulse 67, temperature 97.8 F (36.6 C), temperature source Oral, resp. rate 20, height 6' (1.829 m), weight 78 kg (172 lb), SpO2 100 %.Body mass index is 23.33 kg/m.  General Appearance: Neatly dressed, calm and cooperative. Good relatedness. Appropriate behavior. No tremors, not sweaty. Not internally distracted.   Eye Contact:  Good  Speech:  Clear and Coherent and Normal Rate  Volume:  Normal  Mood:  Euthymic  Affect:  Appropriate and Full Range  Thought Process:  Goal Directed and Linear  Orientation:  Full (Time, Place, and Person)  Thought Content:  Future oriented. No negative rumination. No hallucination in any modality.   Suicidal Thoughts:  No  Homicidal Thoughts:  No  Memory:  Immediate;   Good Recent;   Good Remote;   Good  Judgement:  Good  Insight:  Fair  Psychomotor Activity:  Normal  Concentration:  Concentration: Good and Attention Span: Good  Recall:  Good  Fund of Knowledge:  Good  Language:   Good  Akathisia:  Negative  Handed:    AIMS (if indicated):     Assets:  Communication Skills Desire for Improvement Financial Resources/Insurance Housing Resilience Social Support Transportation  ADL's:  Intact  Cognition:  WNL  Sleep:  Number of Hours: 4.75     Treatment Plan Summary: Effects of substances seems to have worn off. His mood has dramatically improved. Patient is no longer expressing any suicidal thoughts. He is engaging more. Patient has limited insight on the role of substances in his mental health. He does not seem motivated to engage in any relapse preventive measure at this time. We plan to evaluate him again another night. Hopeful discharge tomorrow if he remains stable.   Psychiatric: MDD Recurrent SUD  Medical: HIV  Psychosocial:  Bereavement Chronic medical illness Social isolation Limited support   PLAN: 1. Continue current regimen 2. Continue to monitor mood, behavior and interaction with peers 3. Encourage unit groups and activities  Truman Haywardakia S Starkes, [place]NP 08/03/2017, 2:56 PM    ..Agree with NP Progress Note

## 2017-08-04 ENCOUNTER — Encounter (HOSPITAL_COMMUNITY): Payer: Self-pay | Admitting: Behavioral Health

## 2017-08-04 DIAGNOSIS — F1024 Alcohol dependence with alcohol-induced mood disorder: Secondary | ICD-10-CM

## 2017-08-04 NOTE — BHH Group Notes (Signed)
LCSW Group Therapy Note  08/04/2017   10:00--11:00am   Type of Therapy and Topic:  Group Therapy: Anger Cues and Responses  Participation Level:  Active   Description of Group:   In this group, patients learned how to recognize the physical, cognitive, emotional, and behavioral responses they have to anger-provoking situations.  They identified a recent time they became angry and how they reacted.  They analyzed how their reaction was possibly beneficial and how it was possibly unhelpful.  The group discussed a variety of healthier coping skills that could help with such a situation in the future.  Deep breathing was practiced briefly.  Therapeutic Goals: 1. Patients will remember their last incident of anger and how they felt emotionally and physically, what their thoughts were at the time, and how they behaved. 2. Patients will identify how their behavior at that time worked for them, as well as how it worked against them. 3. Patients will explore possible new behaviors to use in future anger situations. 4. Patients will learn that anger itself is normal and cannot be eliminated, and that healthier reactions can assist with resolving conflict rather than worsening situations.  Summary of Patient Progress:  The patient shared that he has experience some anger since he recently had back surgery. The surgery has diminished his ability to some the things he used to do. He expressed that it is a process to adapt to this new normal.  Therapeutic Modalities:   Cognitive Behavioral Therapy  Evorn Gongonnie D Valissa Lyvers

## 2017-08-04 NOTE — Progress Notes (Signed)
Patient is prepared for dc today per poc. HE is s/p recent cervical surgery ( " to repair my awful neck"), he walks with a cane and he holds his neck quite stiff, not bending and or flexing it. He completes his daily assessment and on this he wrote he denied having SI today and he rated his depression, hopelessness and anxeity  DC instructions reviewed with this pt, he stated understanding and willingness to comply.Per  pc to this writer from [employer]CSW's instructions, pt was told we did not have a taxi voucher- to pay for his ride home and he was given bus ticket.  He is given belongings in his locker, he is escorted to bldg entrance by this Clinical research associatewriter and he is dc''d. --

## 2017-08-04 NOTE — BHH Suicide Risk Assessment (Signed)
[facility] Discharge Suicide Risk Assessment   Principal Problem: depression, alcohol use disorder Discharge Diagnoses:  Patient Active Problem List   Diagnosis Date Noted  . Major depressive disorder, recurrent severe without psychotic features (HCC) [F33.2] 08/01/2017  . Dysphagia [R13.10] 07/08/2017  . 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 Left-sided weakness [R53.1] 06/27/2017  . Tricompartment osteoarthritis of right knee [M17.11] 03/27/2017  . Alcohol abuse with alcohol-induced mood disorder (HCC) [F10.14] 09/25/2016  . Polysubstance dependence (HCC) [F19.20] 09/25/2016  . Substance use disorder [F19.90] 09/19/2016  . MDD (major depressive disorder), recurrent severe, without psychosis (HCC) [F33.2] 09/18/2016  . 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 HIV disease (HCC) [B20] 11/09/2008    Total Time spent with patient: 30 minutes  Musculoskeletal: Strength & Muscle Tone: within normal limits Gait & Station: normal Patient leans: N/A  Psychiatric Specialty Exam: ROS  No headache, no chest pain, no shortness of breath, no vomiting , neck pain has improved partially , no fever, no chills   Blood pressure (!) 146/107, pulse 87, temperature 97.7 F (36.5 C), temperature source Oral, resp. rate 18, height 6' (1.829 m), weight 78 kg (172 lb), SpO2 100 %.Body mass index is 23.33 kg/m.  General Appearance: Well Groomed  Eye Contact::  Good  Speech:  Normal Rate409  Volume:  Normal  Mood:  reporst mood is improved, denies feeling depressed at this time , describes mood as 8/10  Affect:  Appropriate and reactive  Thought Process:  Linear and Descriptions of Associations: Intact  Orientation:  Other:  fully alert and attentive  Thought Content:  no hallucinations, no delusions, not internally preoccupied   Suicidal Thoughts:  No denies suicidal or self injurious ideations, denies homicidal ideations  Homicidal Thoughts:  No  Memory:  recent and  remote grossly intact   Judgement:  Other:  fair- improving   Insight:  improving   Psychomotor Activity:  Normal- no tremors, no diaphoresis, no restlessness or distress   Concentration:  Good  Recall:  Good  Fund of Knowledge:Good  Language: Good  Akathisia:  Negative  Handed:  Right  AIMS (if indicated):     Assets:  Desire for Improvement Resilience  Sleep:  Number of Hours: 1.75  Cognition: WNL  ADL's:  Intact   Mental Status Per Nursing Assessment::   On Admission:  Suicidal ideation indicated by patient, Self-harm thoughts, Intention to act on suicide plan  Demographic Factors:  57, single, lives alone, no children, retired   Loss Factors: Recent cervical surgery for neck pain, recent increased alcohol consumption  Historical Factors: History of depression, one suicidal attempt in 1997. History of alcohol use disorder  Risk Reduction Factors:   Positive social support and Positive coping skills or problem solving skills  Continued Clinical Symptoms:  At this time presents alert, attentive, well related, pleasant, mood is improved, states his mood is better, denies feeling depressed, affect appropriate, reactive, no thought disorder, no suicidal or self injurious ideations, no homicidal ideations, no hallucinations, no delusions, not internally preoccupied . Denies medication side effects.  Presents calm, polite, pleasant on approach. Denies medication side effects.  Cognitive Features That Contribute To Risk:  No gross cognitive deficits noted upon discharge. Is alert , attentive, and oriented x 3    Suicide Risk:  Mild:  Suicidal ideation of limited frequency, intensity, duration, and specificity.  There are no identifiable plans, no associated intent, mild dysphoria and related symptoms, good self-control (both objective and subjective assessment), few  other risk factors, and identifiable protective factors, including available and accessible social  support.  Follow-up Information    [facility] Follow up on 08/10/2017.   Specialty:  Behavioral Health Why:  Hospital follow-up on Friday, 8/2 at 8:00AM. Please bring: Photo ID, insurance card(s), hospital discharge paperwork to this appointment. Thank you.  Contact information: [address] [place] [id number] [phone]           Plan Of Care/Follow-up recommendations:  Activity:  as tolerated  Diet:  regular Tests:  NA Other:  See below  Patient is expressing readiness for discharge, and is leaving unit in good spirits- no current grounds for involuntary commitment [facility] Plans to follow up with Physical Therapy and with Neurosurgeon for follow up regarding recent cervical surgery Plans to follow up at ID clinic with Dr. Ilsa IhaSnyder for ongoing management of HIV.  Craige CottaFernando A Cobos, MD 08/04/2017, 10:32 AM

## 2017-08-04 NOTE — BHH Counselor (Addendum)
CSW provided patient with [place]. Patient had exhausted all family members and peers. Patient stated his sister has his car at her home and that [place]. CSW Christen BameRonnie spoke with supervisor Gretta CoolZackary Brooks prior to doing so.   Patient flagged CSW down after CSW placed bus pass in his chart. Patient reports he has spinal surgery and was thinking he needs a taxi voucher instead. CSW explained taxi vouchers are not something that are typically given out. CSW stated she needs approval from people above her to do that and patient had just asked for a bus pass. Patient stated "I wish they would deny me a taxi because I will call my lawyer." CSW stated she would inquire but there is a bus pass in his chart for discharge. CS[facility] with [facility] about [facility] pass. AC denied taxi voucher for patient at this time and provided reasons. CSW is in agreement however, wanted to make the inquiry as requested to do so by patient.   Shellia CleverlyStephanie N Lorely Bubb, [place]  08/04/2017

## 2017-08-06 ENCOUNTER — Ambulatory Visit: Payer: Medicare Other | Attending: Physical Therapy | Admitting: Physical Therapy

## 2017-09-03 ENCOUNTER — Telehealth: Payer: Self-pay | Admitting: Behavioral Health

## 2017-09-03 NOTE — Telephone Encounter (Signed)
Patient called to request lab results from office visit 07/16/2017.  Patient hung up the phone while he was placed on a breif hold. Patient called back, identity verified .  Lab results given to patient over the phone he was ok with the results.  Patient states he will be going away for drug/alcohol rehab until January. Angeline SlimAshley Kaylob Wallen RN

## 2017-10-16 ENCOUNTER — Telehealth: Payer: Self-pay | Admitting: *Deleted

## 2017-10-16 NOTE — Telephone Encounter (Signed)
Patient called to reschedule his appointment tomorrow.  He is currently out of town, helping family, will be back in December. Patient states he is seeing a doctor/getting medication and labs in [place] in the mean time.  Previous phone note states patient is in [place]-state rehab program. Patient rescheduled for December 25, 2017. Andree Coss, RN

## 2017-10-17 ENCOUNTER — Ambulatory Visit: Payer: Self-pay | Admitting: Internal Medicine

## 2017-12-25 ENCOUNTER — Ambulatory Visit: Payer: Self-pay | Admitting: Internal Medicine

## 2018-01-28 ENCOUNTER — Telehealth: Payer: Self-pay | Admitting: *Deleted

## 2018-01-28 DIAGNOSIS — D500 Iron deficiency anemia secondary to blood loss (chronic): Secondary | ICD-10-CM | POA: Insufficient documentation

## 2018-01-28 DIAGNOSIS — K625 Hemorrhage of anus and rectum: Secondary | ICD-10-CM | POA: Insufficient documentation

## 2018-01-28 NOTE — Telephone Encounter (Signed)
Patient called to cancel upcoming appointment 01/29/2018 with Tammy SoursGreg. [facility] to determine the cause of his hematochezia.  He will call back for a work-in appointment once he is released from the hospital to discuss his decreased CD4 and his HIV regimen.  He is concerned that his regimen is not working due to a dropped CD4 (thinks it's under 200, not sure where this lab result is).  He does not know his recent viral load.  RN counseled him that the CD4 can drop for a number of reasons, but that the viral load would let us know how well the medication is controlling his HIV.  RN counseled him to continue the prezcobix and descovy in the mean time. He agreed. Andree CossHowell, Raequon Catanzaro M, RN

## 2018-01-28 NOTE — Telephone Encounter (Signed)
Agree with plan to continue medications and information provided. Will discuss at next office visit.

## 2018-01-29 ENCOUNTER — Ambulatory Visit: Payer: Medicare Other | Admitting: Family

## 2018-02-01 ENCOUNTER — Telehealth: Payer: Self-pay

## 2018-02-01 NOTE — Telephone Encounter (Signed)
Patient called office to reschedule missed appointment to go over CD4 count. Patient is scheduled to come into clinic on 1/27 at 4:30 to see Marcos Eke, Np. Lorenso Courier, [place]

## 2018-02-04 ENCOUNTER — Ambulatory Visit (INDEPENDENT_AMBULATORY_CARE_PROVIDER_SITE_OTHER): Payer: Medicare Other | Admitting: Family

## 2018-02-04 ENCOUNTER — Encounter: Payer: Self-pay | Admitting: Family

## 2018-02-04 VITALS — BP 123/81 | HR 78 | Temp 97.9°F | Wt 168.0 lb

## 2018-02-04 DIAGNOSIS — Z0000 Encounter for general adult medical examination without abnormal findings: Secondary | ICD-10-CM | POA: Diagnosis not present

## 2018-02-04 DIAGNOSIS — B20 Human immunodeficiency virus [HIV] disease: Secondary | ICD-10-CM

## 2018-02-04 MED ORDER — EMTRICITABINE-TENOFOVIR AF 200-25 MG PO TABS: 1.0000 | ORAL_TABLET | Freq: Every day | ORAL | 2 refills | Status: DC

## 2018-02-04 MED ORDER — DARUNAVIR-COBICISTAT 800-150 MG PO TABS: 1.0000 | ORAL_TABLET | Freq: Every day | ORAL | 2 refills | Status: DC

## 2018-02-04 NOTE — Progress Notes (Signed)
Subjective:    Patient ID: [name], male    DOB: 1960-10-30, 58 y.o.   MRN: [id number]  Chief Complaint  Patient presents with  . HIV Positive/AIDS     HPI:  [name] is a 58 y.o. male who presents today for routine office visit for HIV disease.  [name] was last seen in the office on 07/16/2017 for routine follow up with good adherence and tolerance of his ART regimen of Descovy and Prezcobix. Blood work at the time showed viral suppression with a viral load that was undetectable and CD4 count of 310. No recent blood work has been completed. Health maintenance due includes influenza vaccination, Prevnar, and dental screening.  [name] has continued to take his Descovy and Prezcobix as prescribed with no adverse side effects or missed doses. Continues to receive his medication without problem. He is stressed today because of a recent hospitalization at [facility] where he was seen for a GI bleed s/p endoscopy and colonoscopy. CD4 count at that visit was noted to be 125 and he feels his medication currently is not working like it should be. He was also diagnosed with spinal stenosis and is scheduled to see neurosurgery.   Denies fevers, chills, night sweats, headaches, changes in vision, neck pain/stiffness, nausea, diarrhea, vomiting, lesions or rashes.   Allergies  Allergen Reactions  . Shellfish Allergy Anaphylaxis and Swelling    Swelling everywhere  . Sulfa Antibiotics Anaphylaxis, Hives, Itching and Swelling    Swelling all over   . Fish Allergy Swelling      Outpatient Medications Prior to Visit  Medication Sig Dispense Refill  . buPROPion (WELLBUTRIN XL) 150 MG 24 hr tablet Take 1 tablet (150 mg total) by mouth daily. 30 tablet 0  . darunavir-cobicistat (PREZCOBIX) 800-150 MG tablet Take 1 tablet by mouth daily. Swallow whole. Do NOT crush, break or chew tablets. Take with food, take with Descovy. 30 tablet 5  . emtricitabine-tenofovir AF  (DESCOVY) 200-25 MG tablet Take 1 tablet by mouth daily. Take with Prezcobix. 30 tablet 5  . hydrOXYzine (ATARAX/VISTARIL) 25 MG tablet Take 1 tablet (25 mg total) by mouth every 6 (six) hours as needed (anxiety/agitation or CIWA < or = 10). (Patient not taking: Reported on 02/04/2018) 30 tablet 0   No facility-administered medications prior to visit.      Past Medical History:  Diagnosis Date  . 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 Hemorrhoids   . HIV (human immunodeficiency virus infection) (HCC)   . Stenosis of cervical spine    withb myelopathy     Past Surgical History:  Procedure Laterality Date  . ANTERIOR CERVICAL DECOMP/DISCECTOMY FUSION N/A 07/06/2017   Procedure: ANTERIOR CERVICAL DECOMPRESSION/DISCECTOMY FUSION C3-4/C4-5 2 LEVELS;  Surgeon: Lisbeth Renshaw, MD;  Location: [facility] OR;  Service: Neurosurgery;  Laterality: N/A;  . HERNIA REPAIR  97   lt ing  . RECTAL EXAM UNDER ANESTHESIA N/A 04/24/2014   Procedure:  ligation of bleeding vessels;  Surgeon: Almond Lint, MD;  Location: [facility] ORS;  Service: General;  Laterality: N/A;  . TENDON REPAIR     2006-lt index  . WART FULGURATION  07/06/2011   Procedure: FULGURATION ANAL WART;  Surgeon: Shelly Rubenstein, MD;  Location: [facility];  Service: General;  Laterality: N/A;  excision anal condyloma  . WART FULGURATION N/A 04/23/2014   Procedure: EXCISION OF ANAL CONDYLOMA;  Surgeon: Abigail Miyamoto, MD;  Location: [facility] ORS;  Service: General;  Laterality: N/A;  Review of Systems  Constitutional: Negative for appetite change, chills, fatigue, fever and unexpected weight change.  Eyes: Negative for visual disturbance.  Respiratory: Negative for cough, chest tightness, shortness of breath and wheezing.   Cardiovascular: Negative for chest pain and leg swelling.  Gastrointestinal: Negative for abdominal pain, constipation, diarrhea, nausea and vomiting.  Genitourinary: Negative for dysuria, flank pain, frequency,  genital sores, hematuria and urgency.  Skin: Negative for rash.  Allergic/Immunologic: Negative for immunocompromised state.  Neurological: Negative for dizziness and headaches.      Objective:    BP 123/81   Pulse 78   Temp 97.9 F (36.6 C) (Oral)   Wt 168 lb (76.2 kg)   BMI 22.78 kg/m  Nursing note and vital signs reviewed.  Physical Exam Constitutional:      General: He is not in acute distress.    Appearance: He is well-developed.  Eyes:     Conjunctiva/sclera: Conjunctivae normal.  Neck:     Musculoskeletal: Neck supple.  Cardiovascular:     Rate and Rhythm: Normal rate and regular rhythm.     Heart sounds: Normal heart sounds. No murmur. No friction rub. No gallop.   Pulmonary:     Effort: Pulmonary effort is normal. No respiratory distress.     Breath sounds: Normal breath sounds. No wheezing or rales.  Chest:     Chest wall: No tenderness.  Abdominal:     General: Bowel sounds are normal.     Palpations: Abdomen is soft.     Tenderness: There is no abdominal tenderness.  Lymphadenopathy:     Cervical: No cervical adenopathy.  Skin:    General: Skin is warm and dry.     Findings: No rash.  Neurological:     Mental Status: He is alert and oriented to person, place, and time.  Psychiatric:        Behavior: Behavior normal.        Thought Content: Thought content normal.        Judgment: Judgment normal.        Assessment & Plan:   Problem List Items Addressed This Visit      Other   HIV disease (HCC) - Primary    [name] appears to have well controlled HIV disease with good adherence and tolerance to his ART regimen of Tivicay and Descovy. No current signs/symptoms of opportunistic infection or progressive HIV disease. Reassured him that a change of medication is not likely needed. Will recheck his CD4 count and viral load today. Will call him with the results. Plan for office follow up with Dr. Drue Second in 2-3 months or sooner if needed.        Relevant Medications   darunavir-cobicistat (PREZCOBIX) 800-150 MG tablet   emtricitabine-tenofovir AF (DESCOVY) 200-25 MG tablet   Other Relevant Orders   T-helper cell (CD4)- ([facility] clinic only)   T-helper cell (CD4)- ([facility] clinic only)   HIV-1 RNA quant-no reflex-bld   Lipid panel   RPR   Comprehensive metabolic panel   CBC   HIV-1 RNA ultraquant reflex to gentyp+   Healthcare maintenance     Declines immunizations today.   Colonoscopy completed at [facility] for GI bleed.  Due for dental exam and will re-contact [facility] dental clinic.  Discussed importance of safe sexual practice to reduce risk of acquisition and transmission of STI. Declines condoms today.       Relevant Orders   HIV-1 RNA ultraquant reflex to gentyp+  I am having [name] maintain his buPROPion, hydrOXYzine, darunavir-cobicistat, and emtricitabine-tenofovir AF.   Meds ordered this encounter  Medications  . darunavir-cobicistat (PREZCOBIX) 800-150 MG tablet    Sig: Take 1 tablet by mouth daily. Swallow whole. Do NOT crush, break or chew tablets. Take with food, take with Descovy.    Dispense:  30 tablet    Refill:  2    Will give additional refills at next office visit    Order Specific Question:   Supervising Provider    Answer:   Judyann MunsonSNIDER, CYNTHIA [[id number]]  . emtricitabine-tenofovir AF (DESCOVY) 200-25 MG tablet    Sig: Take 1 tablet by mouth daily. Take with Prezcobix.    Dispense:  30 tablet    Refill:  2    Order Specific Question:   Supervising Provider    Answer:   Judyann MunsonSNIDER, CYNTHIA [[id number]]     Follow-up: Return in about 3 months (around 05/06/2018), or if symptoms worsen or fail to improve.   Marcos EkeGreg , MSN, FNP-C Nurse Practitioner [facility] Office phone: [phone] Pager: [phone] [facility] Main number: [phone]

## 2018-02-04 NOTE — Assessment & Plan Note (Signed)
   Declines immunizations today.   Colonoscopy completed at [facility] for GI bleed.  Due for dental exam and will re-contact [facility] dental clinic.  Discussed importance of safe sexual practice to reduce risk of acquisition and transmission of STI. Declines condoms today.

## 2018-02-04 NOTE — Patient Instructions (Signed)
Nice to meet you.   I will call with your results.   We will check your blood work today.  Continue to take your Descovy and Prezcobix as prescribed.   Plan for follow up office visit in 2-3 months with Dr. Drue Second with blood work 1-2 weeks prior to appointment.

## 2018-02-04 NOTE — Assessment & Plan Note (Signed)
[name] appears to have well controlled HIV disease with good adherence and tolerance to his ART regimen of Tivicay and Descovy. No current signs/symptoms of opportunistic infection or progressive HIV disease. Reassured him that a change of medication is not likely needed. Will recheck his CD4 count and viral load today. Will call him with the results. Plan for office follow up with Dr. Drue Second in 2-3 months or sooner if needed.

## 2018-02-05 LAB — T-HELPER CELL (CD4) - (RCID CLINIC ONLY)
CD4 T CELL ABS: 340 /uL — AB (ref 400–2700)
CD4 T CELL HELPER: 17 % — AB (ref 33–55)

## 2018-02-08 LAB — HIV-1 RNA ULTRAQUANT REFLEX TO GENTYP+
HIV 1 RNA QUANT: 43 {copies}/mL — AB
HIV-1 RNA QUANT, LOG: 1.63 {Log_copies}/mL — AB

## 2018-04-07 ENCOUNTER — Emergency Department (HOSPITAL_COMMUNITY)
Admission: EM | Admit: 2018-04-07 | Discharge: 2018-04-08 | Disposition: A | Discharge status: Other Acute Inpatient Hospital | Payer: Medicare Other | Attending: Emergency Medicine | Admitting: Emergency Medicine

## 2018-04-07 ENCOUNTER — Encounter (HOSPITAL_COMMUNITY): Payer: Self-pay

## 2018-04-07 ENCOUNTER — Other Ambulatory Visit: Payer: Self-pay

## 2018-04-07 ENCOUNTER — Emergency Department (HOSPITAL_COMMUNITY): Payer: Medicare Other

## 2018-04-07 DIAGNOSIS — R29898 Other symptoms and signs involving the musculoskeletal system: Secondary | ICD-10-CM

## 2018-04-07 DIAGNOSIS — R45851 Suicidal ideations: Secondary | ICD-10-CM

## 2018-04-07 DIAGNOSIS — Y902 Blood alcohol level of 40-59 mg/100 ml: Secondary | ICD-10-CM | POA: Insufficient documentation

## 2018-04-07 DIAGNOSIS — F10220 Alcohol dependence with intoxication, uncomplicated: Secondary | ICD-10-CM | POA: Insufficient documentation

## 2018-04-07 DIAGNOSIS — M545 Low back pain, unspecified: Secondary | ICD-10-CM

## 2018-04-07 DIAGNOSIS — Z79899 Other long term (current) drug therapy: Secondary | ICD-10-CM | POA: Insufficient documentation

## 2018-04-07 DIAGNOSIS — B20 Human immunodeficiency virus [HIV] disease: Secondary | ICD-10-CM | POA: Insufficient documentation

## 2018-04-07 DIAGNOSIS — G8929 Other chronic pain: Secondary | ICD-10-CM | POA: Insufficient documentation

## 2018-04-07 DIAGNOSIS — R531 Weakness: Secondary | ICD-10-CM | POA: Insufficient documentation

## 2018-04-07 DIAGNOSIS — F333 Major depressive disorder, recurrent, severe with psychotic symptoms: Secondary | ICD-10-CM | POA: Insufficient documentation

## 2018-04-07 DIAGNOSIS — F332 Major depressive disorder, recurrent severe without psychotic features: Secondary | ICD-10-CM

## 2018-04-07 DIAGNOSIS — F17210 Nicotine dependence, cigarettes, uncomplicated: Secondary | ICD-10-CM | POA: Insufficient documentation

## 2018-04-07 LAB — COMPREHENSIVE METABOLIC PANEL
ALT: 18 U/L (ref 0–44)
ANION GAP: 11 (ref 5–15)
AST: 24 U/L (ref 15–41)
Albumin: 4.4 g/dL (ref 3.5–5.0)
Alkaline Phosphatase: 46 U/L (ref 38–126)
BUN: 19 mg/dL (ref 6–20)
CO2: 23 mmol/L (ref 22–32)
Calcium: 9.3 mg/dL (ref 8.9–10.3)
Chloride: 103 mmol/L (ref 98–111)
Creatinine, Ser: 0.89 mg/dL (ref 0.61–1.24)
Glucose, Bld: 98 mg/dL (ref 70–99)
POTASSIUM: 3.3 mmol/L — AB (ref 3.5–5.1)
Sodium: 137 mmol/L (ref 135–145)
Total Bilirubin: 0.9 mg/dL (ref 0.3–1.2)
Total Protein: 7.8 g/dL (ref 6.5–8.1)

## 2018-04-07 LAB — RAPID URINE DRUG SCREEN, HOSP PERFORMED
AMPHETAMINES: NOT DETECTED
BARBITURATES: NOT DETECTED
Benzodiazepines: POSITIVE — AB
COCAINE: NOT DETECTED
Opiates: POSITIVE — AB
TETRAHYDROCANNABINOL: NOT DETECTED

## 2018-04-07 LAB — CBC WITH DIFFERENTIAL/PLATELET
Abs Immature Granulocytes: 0.01 10*3/uL (ref 0.00–0.07)
BASOS PCT: 1 %
Basophils Absolute: 0 10*3/uL (ref 0.0–0.1)
EOS ABS: 0.1 10*3/uL (ref 0.0–0.5)
EOS PCT: 1 %
HCT: 38.5 % — ABNORMAL LOW (ref 39.0–52.0)
Hemoglobin: 11.7 g/dL — ABNORMAL LOW (ref 13.0–17.0)
IMMATURE GRANULOCYTES: 0 %
Lymphocytes Relative: 33 %
Lymphs Abs: 2 10*3/uL (ref 0.7–4.0)
MCH: 23.8 pg — AB (ref 26.0–34.0)
MCHC: 30.4 g/dL (ref 30.0–36.0)
MCV: 78.3 fL — AB (ref 80.0–100.0)
MONO ABS: 0.4 10*3/uL (ref 0.1–1.0)
Monocytes Relative: 7 %
NEUTROS PCT: 58 %
Neutro Abs: 3.5 10*3/uL (ref 1.7–7.7)
PLATELETS: 444 10*3/uL — AB (ref 150–400)
RBC: 4.92 MIL/uL (ref 4.22–5.81)
RDW: 19.5 % — AB (ref 11.5–15.5)
WBC: 6 10*3/uL (ref 4.0–10.5)
nRBC: 0 % (ref 0.0–0.2)

## 2018-04-07 LAB — ACETAMINOPHEN LEVEL

## 2018-04-07 LAB — SALICYLATE LEVEL: Salicylate Lvl: <7 mg/dL (ref 2.8–30.0)

## 2018-04-07 LAB — ETHANOL: Alcohol, Ethyl (B): 59 mg/dL — ABNORMAL HIGH (ref <10)

## 2018-04-07 MED ORDER — LORAZEPAM 2 MG/ML IJ SOLN
1.0000 mg | Freq: Once | INTRAMUSCULAR | Status: AC
  Administered 2018-04-07: 1 mg via INTRAVENOUS
  Filled 2018-04-07: qty 1

## 2018-04-07 MED ORDER — THIAMINE HCL 100 MG/ML IJ SOLN: 100.0000 mg | Freq: Every day | INTRAMUSCULAR | Status: DC

## 2018-04-07 MED ORDER — ALUM & MAG HYDROXIDE-SIMETH 200-200-20 MG/5ML PO SUSP: 30.0000 mL | Freq: Four times a day (QID) | ORAL | Status: DC | PRN

## 2018-04-07 MED ORDER — LORAZEPAM 1 MG PO TABS: 0.0000 mg | ORAL_TABLET | Freq: Four times a day (QID) | ORAL | Status: DC

## 2018-04-07 MED ORDER — BUPROPION HCL ER (XL) 150 MG PO TB24
150.0000 mg | ORAL_TABLET | Freq: Every day | ORAL | Status: DC
  Administered 2018-04-07 – 2018-04-08 (×2): 150 mg via ORAL
  Filled 2018-04-07 (×2): qty 1

## 2018-04-07 MED ORDER — MORPHINE SULFATE (PF) 4 MG/ML IV SOLN
4.0000 mg | Freq: Once | INTRAVENOUS | Status: AC
  Administered 2018-04-07: 4 mg via INTRAVENOUS
  Filled 2018-04-07: qty 1

## 2018-04-07 MED ORDER — EMTRICITABINE-TENOFOVIR AF 200-25 MG PO TABS
1.0000 | ORAL_TABLET | Freq: Every day | ORAL | Status: DC
  Administered 2018-04-07 – 2018-04-08 (×2): 1 via ORAL
  Filled 2018-04-07 (×2): qty 1

## 2018-04-07 MED ORDER — VITAMIN B-1 100 MG PO TABS
100.0000 mg | ORAL_TABLET | Freq: Every day | ORAL | Status: DC
  Administered 2018-04-07 – 2018-04-08 (×2): 100 mg via ORAL
  Filled 2018-04-07 (×2): qty 1

## 2018-04-07 MED ORDER — LORAZEPAM 2 MG/ML IJ SOLN: 0.0000 mg | Freq: Four times a day (QID) | INTRAMUSCULAR | Status: DC

## 2018-04-07 MED ORDER — LORAZEPAM 2 MG/ML IJ SOLN
INTRAMUSCULAR | Status: AC
  Administered 2018-04-07: 0.5 mg via INTRAVENOUS
  Filled 2018-04-07: qty 1

## 2018-04-07 MED ORDER — LORAZEPAM 2 MG/ML IJ SOLN
0.5000 mg | Freq: Once | INTRAMUSCULAR | Status: AC
  Administered 2018-04-07: 0.5 mg via INTRAVENOUS

## 2018-04-07 MED ORDER — DARUNAVIR-COBICISTAT 800-150 MG PO TABS
1.0000 | ORAL_TABLET | Freq: Every day | ORAL | Status: DC
  Administered 2018-04-07 – 2018-04-08 (×2): 1 via ORAL
  Filled 2018-04-07 (×2): qty 1

## 2018-04-07 MED ORDER — LORAZEPAM 2 MG/ML IJ SOLN: 0.0000 mg | Freq: Two times a day (BID) | INTRAMUSCULAR | Status: DC

## 2018-04-07 MED ORDER — LORAZEPAM 1 MG PO TABS: 0.0000 mg | ORAL_TABLET | Freq: Two times a day (BID) | ORAL | Status: DC

## 2018-04-07 NOTE — ED Notes (Signed)
Pt requesting to "order outside food" Pt informed that no outside food allowed.

## 2018-04-07 NOTE — ED Notes (Signed)
Pt given sandwich, coke, cheese stick, and ravioli at this time. Pt aware he cannot order food from outside the hospital and that it is too late to order a dinner tray.

## 2018-04-07 NOTE — ED Notes (Signed)
Pt requesting to eat. Pt made aware, per PA: pt may not eat at this time.

## 2018-04-07 NOTE — ED Notes (Signed)
Ativan to be administered when pt goes to MRI

## 2018-04-07 NOTE — ED Triage Notes (Signed)
He tells me he has spinal stenosis. He is here today with c/o right hip pain and generalized weakness with difficulty standing x 3-4 weeks. He is in no distress.

## 2018-04-07 NOTE — ED Notes (Signed)
MRI came to transport pt to MRI. Pt stated to MRI " I need another mg of Ativan. I was asleep last time. I cant do it right now" PA made aware. See orders

## 2018-04-07 NOTE — ED Notes (Signed)
Pt aware urine needed. Pt has had urinal at bedside.

## 2018-04-07 NOTE — ED Notes (Signed)
TTS requested to have tele-psych placed at bedside. Tele-Psych placed at bedside

## 2018-04-07 NOTE — ED Notes (Signed)
Pt transported to MRI at this time 

## 2018-04-07 NOTE — ED Notes (Signed)
Pt called out requesting for something to eat or drink. Pt informed that per PA: Pt may not eat or drink at this time.

## 2018-04-07 NOTE — ED Notes (Signed)
Pt returned from MRI at this time

## 2018-04-07 NOTE — ED Notes (Signed)
Pt claims he lost his watch. Watch was found and is in his bookbag. Pt saw staff place watch in bag.

## 2018-04-07 NOTE — ED Notes (Signed)
Pt walks with cane.

## 2018-04-07 NOTE — ED Notes (Signed)
Tele-Psych in progress with patient currently.

## 2018-04-07 NOTE — ED Provider Notes (Addendum)
[facility]-EMERGENCY DEPT Provider Note   CSN: [id number] Arrival date & time: 04/07/18  1204    History   Chief Complaint Chief Complaint  Patient presents with   Hip Pain    HPI [name] is a 58 y.o. male with history of well-controlled HIV, rectal condyloma, EtOH abuse, polysubstance abuse, depression, anxiety, tobacco abuse, chronic right knee pain, chronic back pain, cervical spinal stenosis and myelopathy s/p cervical ACDF June 2019 by Dr. Conchita Paris, acute blood loss anemia secondary to ulcer is here for evaluation of right leg weakness. Onset 2-3 months ago, progressively worsening.  Described as leg feeling heavy. He has been using a cane to help with walking. Foot is dragging. He has a hard time flexing at the hip. Has to lift his right leg up when getting in/out of bed.  Associated with chronic low back pain bilateral R>L.  States in the last 3 months he has had bowel incontinence.  States he has woken up in the middle of the night and noticed stool on his sheets.  Also states sometimes he soils himself with urine and stool because he can't make it to the bathroom in time due to weakness and pain.  No bladder incontinence.   Avoids ibuprofen, tylenol does not help symptoms. No alleviating/aggravating factors. One trip an fall forward on [employer] 1 year ago but no recent falls.  Denies saddle anesthesia.  No fever,chills, nausea, vomiting, CP, SOB, abdominal pain, dysuria, hematuria. No IVDU. Has not contacted previous neurosurgery regarding symptoms. Progressive weakness has made his depression worse, tearful.      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   Hemorrhoids    HIV (human immunodeficiency virus infection) (HCC)    Stenosis of cervical spine    withb myelopathy    Patient Active Problem List   Diagnosis Date Noted   Healthcare maintenance 02/04/2018   Alcohol dependence with alcohol-induced mood  disorder (HCC)    Major depressive disorder, recurrent severe without psychotic features (HCC) 08/01/2017   Dysphagia 07/08/2017   Stenosis of cervical spine with myelopathy (HCC) 07/06/2017   Left-sided weakness 06/27/2017   Tricompartment osteoarthritis of right knee 03/27/2017   Alcohol abuse with alcohol-induced mood disorder (HCC) 09/25/2016   Polysubstance dependence (HCC) 09/25/2016   Substance use disorder 09/19/2016   MDD (major depressive disorder), recurrent severe, without psychosis (HCC) 09/18/2016   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   HIV disease (HCC) 11/09/2008    Past Surgical History:  Procedure Laterality Date   ANTERIOR CERVICAL DECOMP/DISCECTOMY FUSION N/A 07/06/2017   Procedure: ANTERIOR CERVICAL DECOMPRESSION/DISCECTOMY FUSION C3-4/C4-5 2 LEVELS;  Surgeon: Lisbeth Renshaw, MD;  Location: [facility] OR;  Service: Neurosurgery;  Laterality: N/A;   HERNIA REPAIR  97   lt ing   RECTAL EXAM UNDER ANESTHESIA N/A 04/24/2014   Procedure:  ligation of bleeding vessels;  Surgeon: Almond Lint, MD;  Location: [facility];  Service: General;  Laterality: N/A;   TENDON REPAIR     2006-lt index   WART FULGURATION  07/06/2011   Procedure: FULGURATION ANAL WART;  Surgeon: Shelly Rubenstein, MD;  Location: [facility];  Service: General;  Laterality: N/A;  excision anal condyloma   WART FULGURATION N/A 04/23/2014   Procedure: EXCISION OF ANAL CONDYLOMA;  Surgeon: Abigail Miyamoto, MD;  Location: [facility];  Service: General;  Laterality: N/A;        Home Medications    Prior to Admission medications   Medication Sig  Start Date End Date Taking? Authorizing Provider  buPROPion (WELLBUTRIN XL) 150 MG 24 hr tablet Take 1 tablet (150 mg total) by mouth daily. 08/04/17   Starkes-Perry, Juel Burrow, FNP  darunavir-cobicistat (PREZCOBIX) 800-150 MG tablet Take 1 tablet by mouth daily. Swallow whole. Do NOT crush, break or chew tablets. Take with food, take with Descovy. 02/04/18    Veryl Speak, FNP  emtricitabine-tenofovir AF (DESCOVY) 200-25 MG tablet Take 1 tablet by mouth daily. Take with Prezcobix. 02/04/18   Veryl Speak, FNP  hydrOXYzine (ATARAX/VISTARIL) 25 MG tablet Take 1 tablet (25 mg total) by mouth every 6 (six) hours as needed (anxiety/agitation or CIWA < or = 10). Patient not taking: Reported on 02/04/2018 08/03/17   Maryagnes Amos, FNP    Family History Family History  Problem Relation Age of Onset   Cancer Mother        brain   Cancer Sister        breast    Social History Social History   Tobacco Use   Smoking status: Current Every Day Smoker    Packs/day: 0.50    Years: 25.00    Pack years: 12.50    Types: Cigarettes   Smokeless tobacco: Never Used  Substance Use Topics   Alcohol use: Yes    Alcohol/week: 7.0 - 8.0 standard drinks    Types: 7 - 8 Cans of beer per week    Comment: a case every 3 to 4 days   Drug use: No     Allergies   Shellfish allergy; Sulfa antibiotics; and Fish allergy   Review of Systems Review of Systems  Musculoskeletal: Positive for back pain.  Neurological: Positive for weakness.  All other systems reviewed and are negative.    Physical Exam Updated Vital Signs BP (!) 108/59 (BP Location: Left Arm)    Pulse 93    Temp 98.4 F (36.9 C) (Oral)    Resp 18    SpO2 94%   Physical Exam Vitals signs and nursing note reviewed.  Constitutional:      Appearance: He is well-developed.     Comments: Non toxic.  HENT:     Head: Normocephalic and atraumatic.     Nose: Nose normal.  Eyes:     Conjunctiva/sclera: Conjunctivae normal.     Pupils: Pupils are equal, round, and reactive to light.  Neck:     Musculoskeletal: Normal range of motion.     Comments: Non tender mass to right lateral neck (chronic) Cardiovascular:     Rate and Rhythm: Normal rate and regular rhythm.     Heart sounds: Normal heart sounds.     Comments: 1+ radial and DP pulses bilaterally. No LE  edema. Pulmonary:     Effort: Pulmonary effort is normal.     Breath sounds: Normal breath sounds.  Abdominal:     General: Bowel sounds are normal.     Palpations: Abdomen is soft.     Tenderness: There is no abdominal tenderness.     Comments: No G/R/R. No pulsatility. No suprapubic or CVA tenderness.   Genitourinary:    Comments: DRE performed with RN at bedside.  Multiple verrucous lesions to the perianal skin.  Encountered good rectal tone with digital insertion but when asked to squeeze patient was unable to do it, question effort.  Stool was brown no obvious blood. Musculoskeletal: Normal range of motion.     Comments: T-spine: no midline or paraspinal tenderness. L-spine: Mild low midline tenderness. Mild  bilateral paraspinal tenderness. No SI joint or sciatic notch tenderness.  Pelvis: No instability with AP/lateral compression.  No leg shortening or rotation.  Mild pain in the low back with right hip passive ROM.  No tenderness to focal bony prominences of the hips.  Skin:    General: Skin is warm and dry.     Capillary Refill: Capillary refill takes less than 2 seconds.  Neurological:     Mental Status: He is alert and oriented to person, place, and time.     Comments: Decreased strength against resistance on right hip flexion.  Patient can lift R leg off the bed for a second, drops down.  Strength is intact in the right knee, ankle and great toe.  Strength intact in the left lower extremity.  Brisk, symmetric patellar DTRs bilaterally.  Alert and oriented to self, place, time and event.  Speech is fluent without dysarthria or dysphasia. Strength 5/5 with hand grip and ankle F/E.   Sensation to light touch intact in face, hands and feet. No pronator drift. No L leg drop.  CN I not tested.  CN II grossly intact visual fields bilaterally. Unable to visualize posterior eye. CN III, IV, VI PEERL and EOMs intact bilaterally CN V light touch intact in all 3 divisions of trigeminal  nerve CN VII facial movements symmetric CN VIII not tested CN IX, X no uvula deviation, symmetric rise of soft palate  CN XI 5/5 SCM and trapezius strength bilaterally  CN XII Midline tongue protrusion, symmetric L/R movements  Psychiatric:        Behavior: Behavior normal.        Thought Content: Thought content normal.        Judgment: Judgment normal.      ED Treatments / Results  Labs (all labs ordered are listed, but only abnormal results are displayed) Labs Reviewed  CBC WITH DIFFERENTIAL/PLATELET - Abnormal; Notable for the following components:      Result Value   Hemoglobin 11.7 (*)    HCT 38.5 (*)    MCV 78.3 (*)    MCH 23.8 (*)    RDW 19.5 (*)    Platelets 444 (*)    All other components within normal limits  COMPREHENSIVE METABOLIC PANEL - Abnormal; Notable for the following components:   Potassium 3.3 (*)    All other components within normal limits    EKG None  Radiology Mr Lumbar Spine Wo Contrast  Result Date: 04/07/2018 CLINICAL DATA:  RIGHT leg weakness. Chronic low back pain. Some incontinence is reported. EXAM: MRI LUMBAR SPINE WITHOUT CONTRAST TECHNIQUE: Multiplanar, multisequence MR imaging of the lumbar spine was performed. No intravenous contrast was administered. COMPARISON:  01/17/2018 [facility]. FINDINGS: There is significant motion degradation. Small or subtle lesions could be overlooked. Segmentation: Standard. Alignment:  Physiologic. Vertebrae: No fracture, evidence of discitis, or bone lesion. Endplate reactive changes above and below L5-S1 related to chronic degenerative disc disease. Conus medullaris and cauda equina: Conus extends to the L1 level. Conus and cauda equina appear normal. Paraspinal and other soft tissues: Unremarkable. Bladder distention is not clearly identified. Disc levels: L1-L2: Normal disc space. Posterior element hypertrophy. No definite impingement. L2-L3: Unremarkable disc space. Short pedicles. Posterior element  hypertrophy. No definite impingement. L3-L4: Unremarkable disc space. Posterior element hypertrophy. Short pedicles. Mild to moderate stenosis. Borderline subarticular zone narrowing could affect either L4 nerve root. L4-L5: Loss of disc height and signal. Central protrusion with osseous spurring. Posterior element hypertrophy. Short pedicles.  Severe stenosis. BILATERAL L5 neural impingement. Foraminal narrowing, not clearly compressive. L5-S1: Loss of disc height and signal. Central and rightward protrusion with osseous spurring. Short pedicles. Posterior element hypertrophy. Severe stenosis. RIGHT greater than LEFT S1 nerve root impingement. BILATERAL foraminal narrowing, not clearly compressive. IMPRESSION: Motion degraded scan demonstrate chronic changes of congenital and acquired stenosis at L4-5 and L5-S1, with short pedicles, posterior element hypertrophy, disc protrusions, and osseous spurring. RIGHT-sided changes appear to be more severe at the L5-S1 level, although symptomatic neural impingement could occur at either L4-5 or L5-S1. Electronically Signed   By: Elsie Stain M.D.   On: 04/07/2018 15:14   Dg Hip Unilat W Or Wo Pelvis 2-3 Views Right  Result Date: 04/07/2018 CLINICAL DATA:  Spinal stenosis. Right hip and groin pain with difficulty standing for 3-4 weeks. No injury or fall. EXAM: DG HIP (WITH OR WITHOUT PELVIS) 2-3V RIGHT COMPARISON:  None. FINDINGS: There are degenerative changes in the right hip asymmetric to the left. There is significant loss of joint space superiorly. No fractures, dislocations, or suspicious bony lesions. No other acute abnormalities. IMPRESSION: Degenerative changes in the right hip with loss of joint space superiorly. This is asymmetric to the left. Electronically Signed   By: Gerome Sam III M.D   On: 04/07/2018 14:12    Procedures Procedures (including critical care time)  Medications Ordered in ED Medications  LORazepam (ATIVAN) injection 1 mg (1 mg  Intravenous Given 04/07/18 1419)  morphine 4 MG/ML injection 4 mg (4 mg Intravenous Given 04/07/18 1323)  LORazepam (ATIVAN) injection 0.5 mg (0.5 mg Intravenous Given 04/07/18 1434)     Initial Impression / Assessment and Plan / ED Course  I have reviewed the triage vital signs and the nursing notes.  Pertinent labs & imaging results that were available during my care of the patient were reviewed by me and considered in my medical decision making (see chart for details).  Clinical Course as of Apr 06 1604  Sun Apr 07, 2018  1428 FINDINGS: There are degenerative changes in the right hip asymmetric to the left. There is significant loss of joint space superiorly. No fractures, dislocations, or suspicious bony lesions. No other acute abnormalities.  DG Hip Unilat W or Wo Pelvis 2-3 Views Right [CG]  1602 Discussed MRI with Costello PA neurosurgery.  Patient is well-known to his practice, apparently has been told to come to the office for appointment but patient has not followed up.  Costello able to review most recent MRI from the lumbar spine 2 months ago and unchanged today.  I asked patient regarding this MRI, he initially told me he had never had one.  Continues to deny ever having one recently.  Discussed results of MRI today, unchanged.  Discussed plan to discharge.  At this time.,  Patient stated he needs to talk to psych counselors because he is depressed and thinking of hurting himself using pills.   [CG]    Clinical Course User Index [CG] Liberty Handy, PA-C      There is objective weakness mostly in right hip flexors, strength and sensation intact otherwise.  Although chronic back pain and right leg weakness, he has not had MRI for evaluation of this. Given worsening symptoms, weakness on exam, and questionable bowel incontinence it is reasonable to obtain MRI lumbar spine to evaluate for critical spinal stenosis/meylopathy that may need urgent/emergent surgery.  HD stable,  afebrile.  No IVDU. No recent injections to back. Doubt dissection, AAA, epidural abscess  or hematoma. No urinary symptoms and renal colic/etiology unlikely. Will obtain labs, hip x-ray and MRI.  He has h/o polysubstance abuse but given worsening pain will give one dose of morphine.   1439: Hip x-ray shows asymmetric degenerative changes and significant loss of joint space could be reason or contributor to pain.  MRI pending.  1604: MRI  Unchanged from last MRI in January. Discussed with neurosurgery PA.  Pt has not f/u in office. Pt continued to deny every having MRI lumbar.  Discussed plan to discharge with neurosurgery f/u. After discussing discharge he requested psych consult due to worsening depression. Endorses SI with plan to use his pills to overdose. Med cleared. Pending TTS.  Final Clinical Impressions(s) / ED Diagnoses   Final diagnoses:  Chronic midline low back pain without sciatica  Right leg weakness  Suicidal ideation    ED Discharge Orders    None         Liberty Handy, PA-C 04/07/18 1606    Rolan Bucco, MD 04/08/18 [phone]

## 2018-04-07 NOTE — ED Notes (Signed)
Bed: WA29 Expected date:  Expected time:  Means of arrival:  Comments: Hold for 14 

## 2018-04-07 NOTE — Discharge Instructions (Addendum)
You were seen in the ED for low back and right hip pain, right leg weakness. X-ray shows advanced degenerative bone changes to right hip joint.  This is contributing to your symptoms.   MRI showed  You need to establish care with neurosurgery for long term management of your low back pain and leg weakness.  [phone] mg acetaminophen every 6 hours for pain.    Return for fever, urinary symptoms, loss of bladder or bowel control, worsening pain or weakness.

## 2018-04-07 NOTE — ED Notes (Signed)
Pt requesting sandwich. Pt provided with water and sandwich.

## 2018-04-07 NOTE — BH Assessment (Signed)
Tele Assessment Note   Patient Name: [name] MRN: [id number] Referring Physician: Markus Jarvis Location of Patient: [facility] Location of Provider: Behavioral Health TTS Department  Konrad Desai is a 58 y.o. male who presented to [facility] on voluntary basis due to ongoing physical pain.  During assessment, Pt endorsed suicidal ideation with plan.  Pt lives alone in [place].  He is unemployed and receives Therapist, art.  He is not followed by any outpatient psychiatric services.  Pt reported that he came to the hospital due to significant back pain, which he described as getting worse.  Pt said that as a result of the pain, he feels suicidal and has a plan to overdose on his prescribed and OTC medications.  Pt endorsed the following symptoms:  Suicidal ideation with plan; persistent and unremitting despondency; isolation; insomnia; hopelessness and worthlessness; irritability.  Pt also endorsed daily consumption of alcohol -- up to a case of beer per day.  Last use was 04/06/2018.  Pt's UDS was .58.  Pt also endorsed auditory hallucination -- ''voices laughing.''  Pt endorsed the belief that people are talking about him (possible paranoid ideation).  Pt stated that he does not believe he is properly diagnosed and treated, and if he does not get help, he will kill himself.  Pt denied any current psychiatric care, but he stated that he is supposed to take ''benadryl and serotonin (sic).''    Pt reported that he attempted suicide once by hanging in the late 1990s, and he was hospitalized for suicidal ideation in 2019.  During assessment, Pt presented as alert and oriented.  He had fair eye contact.  Demeanor was irritable.  Pt's mood was depressed and irritable, and affect was mood congruent.  Pt endorsed suicidal ideation, despondency, and other symptoms, including episodes of auditory hallucination and daily alcohol use.  Pt's speech was normal in rate, rhythm, and volume.  Thought processes were  within normal range, and thought content was logical and goal-oriented.  There was no evidence of delusion.  Pt's memory and concentration were intact. Insight, judgment, and impulse control were fair to poor.  Consulted with Irving Burton, NP, who determined that Pt should remain in the ED, stabilize, sober, and have AM psych eval.  Diagnosis: Major Depressive Disorder, Recurrent, Severe, w/psychotic features; Alcohol Use Disorder, Severe  Past Medical History:  Past Medical History:  Diagnosis Date  . 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 Hemorrhoids   . HIV (human immunodeficiency virus infection) (HCC)   . Stenosis of cervical spine    withb myelopathy    Past Surgical History:  Procedure Laterality Date  . ANTERIOR CERVICAL DECOMP/DISCECTOMY FUSION N/A 07/06/2017   Procedure: ANTERIOR CERVICAL DECOMPRESSION/DISCECTOMY FUSION C3-4/C4-5 2 LEVELS;  Surgeon: Lisbeth Renshaw, MD;  Location: [facility];  Service: Neurosurgery;  Laterality: N/A;  . HERNIA REPAIR  97   lt ing  . RECTAL EXAM UNDER ANESTHESIA N/A 04/24/2014   Procedure:  ligation of bleeding vessels;  Surgeon: Almond Lint, MD;  Location: [facility];  Service: General;  Laterality: N/A;  . TENDON REPAIR     2006-lt index  . WART FULGURATION  07/06/2011   Procedure: FULGURATION ANAL WART;  Surgeon: Shelly Rubenstein, MD;  Location: [facility];  Service: General;  Laterality: N/A;  excision anal condyloma  . WART FULGURATION N/A 04/23/2014   Procedure: EXCISION OF ANAL CONDYLOMA;  Surgeon: Abigail Miyamoto, MD;  Location: [facility];  Service: General;  Laterality: N/A;    Family History:  Family  History  Problem Relation Age of Onset  . Cancer Mother        brain  . Cancer Sister        breast    Social History:  reports that he has been smoking cigarettes. He has a 12.50 pack-year smoking history. He has never used smokeless tobacco. He reports current alcohol use of about 7.0 - 8.0 standard drinks of alcohol per week. He  reports that he does not use drugs.  Additional Social History:  Alcohol / Drug Use Pain Medications: See MAR Prescriptions: See MAR Over the Counter: See MAR History of alcohol / drug use?: Yes Substance #1 Name of Substance 1: Alcohol 1 - Amount (size/oz): Varied --''up to a case a day'' 1 - Frequency: Daily 1 - Duration: Ongoing 1 - Last Use / Amount: 04/06/2018  CIWA: CIWA-Ar BP: 133/77 Pulse Rate: 83 COWS:    Allergies:  Allergies  Allergen Reactions  . Shellfish Allergy Anaphylaxis and Swelling    Swelling everywhere  . Sulfa Antibiotics Anaphylaxis, Hives, Itching and Swelling    Swelling all over   . Fish Allergy Swelling    Home Medications: (Not in a hospital admission)   OB/GYN Status:  No LMP for male patient.  General Assessment Data TTS Assessment: In system Is this a Tele or Face-to-Face Assessment?: Tele Assessment Is this an Initial Assessment or a Re-assessment for this encounter?: Initial Assessment Patient Accompanied by:: N/A Language Other than English: No Living Arrangements: (Lives alone) What gender do you identify as?: Male Marital status: Single Pregnancy Status: No Living Arrangements: Alone Can pt return to current living arrangement?: Yes Admission Status: Voluntary Is patient capable of signing voluntary admission?: Yes Referral Source: Self/Family/Friend Insurance type: [facility] MCR     Crisis Care Plan Living Arrangements: Alone Name of Psychiatrist: None Name of Therapist: None  Education Status Is patient currently in school?: No Is the patient employed, unemployed or receiving disability?: Unemployed  Risk to self with the past 6 months Suicidal Ideation: Yes-Currently Present Has patient been a risk to self within the past 6 months prior to admission? : No Suicidal Intent: No Has patient had any suicidal intent within the past 6 months prior to admission? : No Is patient at risk for suicide?: Yes Suicidal Plan?:  Yes-Currently Present Has patient had any suicidal plan within the past 6 months prior to admission? : No Specify Current Suicidal Plan: Overdose on OTC and prescribed meds Access to Means: Yes Specify Access to Suicidal Means: Prescribed and OTC meds What has been your use of drugs/alcohol within the last 12 months?: Alcohol Previous Attempts/Gestures: Yes How many times?: 1 Triggers for Past Attempts: Unknown Intentional Self Injurious Behavior: None Family Suicide History: Unknown Recent stressful life event(s): Recent negative physical changes(continuous physical pain) Persecutory voices/beliefs?: No Depression: Yes Depression Symptoms: Despondent, Feeling angry/irritable, Feeling worthless/self pity, Fatigue Substance abuse history and/or treatment for substance abuse?: Yes Suicide prevention information given to non-admitted patients: Not applicable  Risk to Others within the past 6 months Homicidal Ideation: No Does patient have any lifetime risk of violence toward others beyond the six months prior to admission? : No Thoughts of Harm to Others: No Current Homicidal Intent: No Current Homicidal Plan: No Access to Homicidal Means: No History of harm to others?: No Assessment of Violence: None Noted Does patient have access to weapons?: No Criminal Charges Pending?: No Does patient have a court date: No Is patient on probation?: No  Psychosis Hallucinations: Auditory Delusions:  Persecutory(Possible paranoid delusion)  Mental Status Report Appearance/Hygiene: In scrubs, Unremarkable Eye Contact: Fair Motor Activity: Freedom of movement, Unremarkable Speech: Logical/coherent Level of Consciousness: Irritable, Alert Mood: Irritable, Depressed Affect: Appropriate to circumstance Anxiety Level: None Thought Processes: Relevant, Coherent Judgement: Partial Orientation: Person, Place, Situation, Time Obsessive Compulsive Thoughts/Behaviors: None  Cognitive  Functioning Concentration: Normal Memory: Recent Intact, Remote Intact Is patient IDD: No Insight: Fair Impulse Control: Fair Appetite: Fair Have you had any weight changes? : No Change Sleep: No Change Vegetative Symptoms: None  ADLScreening [facility] Assessment Services) Patient's cognitive ability adequate to safely complete daily activities?: Yes Patient able to express need for assistance with ADLs?: Yes Independently performs ADLs?: Yes (appropriate for developmental age)  Prior Inpatient Therapy Prior Inpatient Therapy: Yes Prior Therapy Dates: 2019 and other Prior Therapy Facilty/Provider(s): [facility] Reason for Treatment: SI  Prior Outpatient Therapy Prior Outpatient Therapy: No Does patient have an ACCT team?: No Does patient have Intensive In-House Services?  : No Does patient have [facility] services? : No Does patient have P4CC services?: No  ADL Screening (condition at time of admission) Patient's cognitive ability adequate to safely complete daily activities?: Yes Is the patient deaf or have difficulty hearing?: No Does the patient have difficulty seeing, even when wearing glasses/contacts?: No Does the patient have difficulty concentrating, remembering, or making decisions?: No Patient able to express need for assistance with ADLs?: Yes Does the patient have difficulty dressing or bathing?: No Independently performs ADLs?: Yes (appropriate for developmental age) Communication: Independent Dressing (OT): Independent Grooming: Independent Feeding: Independent Bathing: Independent Toileting: Independent In/Out Bed: Independent Walks in Home: Independent with device (comment) Does the patient have difficulty walking or climbing stairs?: Yes(Pt said he uses cane)  Home Assistive Devices/Equipment Home Assistive Devices/Equipment: Cane (specify quad or straight)  Therapy Consults (therapy consults require a physician order) PT Evaluation Needed: No OT Evalulation  Needed: No SLP Evaluation Needed: No Abuse/Neglect Assessment (Assessment to be complete while patient is alone) Abuse/Neglect Assessment Can Be Completed: Yes Physical Abuse: Denies Verbal Abuse: Denies Sexual Abuse: Denies Exploitation of patient/patient's resources: Denies Self-Neglect: Denies Values / Beliefs Cultural Requests During Hospitalization: None Spiritual Requests During Hospitalization: None Consults Spiritual Care Consult Needed: No Social Work Consult Needed: No Merchant navy officer (For Healthcare) Does Patient Have a Medical Advance Directive?: No Would patient like information on creating a medical advance directive?: No - Patient declined          Disposition:  Disposition Initial Assessment Completed for this Encounter: Yes Patient referred to: Other (Comment)(Stabilize, sober, AM psych eval)  This service was provided via telemedicine using a 2-way, interactive audio and video technology.  Names of all persons participating in this telemedicine service and their role in this encounter. Name: Davien, Malone Role: Patient             Earline Mayotte 04/07/2018 6:37 PM

## 2018-04-07 NOTE — ED Notes (Signed)
RN has informed Secondary [employer] teacher that patient has MRI ordered.

## 2018-04-07 NOTE — ED Notes (Signed)
Patient transported to X-ray 

## 2018-04-08 ENCOUNTER — Other Ambulatory Visit: Payer: Self-pay

## 2018-04-08 ENCOUNTER — Encounter (HOSPITAL_COMMUNITY): Payer: Self-pay

## 2018-04-08 ENCOUNTER — Inpatient Hospital Stay (HOSPITAL_COMMUNITY)
Admission: AD | Admit: 2018-04-08 | Discharge: 2018-04-11 | DRG: 885 | Disposition: A | Discharge status: Home / Self Care | Payer: Medicare Other | Source: Intra-hospital | Attending: Psychiatry | Admitting: Psychiatry

## 2018-04-08 DIAGNOSIS — Z915 Personal history of self-harm: Secondary | ICD-10-CM

## 2018-04-08 DIAGNOSIS — F332 Major depressive disorder, recurrent severe without psychotic features: Secondary | ICD-10-CM

## 2018-04-08 DIAGNOSIS — Z79899 Other long term (current) drug therapy: Secondary | ICD-10-CM

## 2018-04-08 DIAGNOSIS — G8929 Other chronic pain: Secondary | ICD-10-CM | POA: Diagnosis present

## 2018-04-08 DIAGNOSIS — F17210 Nicotine dependence, cigarettes, uncomplicated: Secondary | ICD-10-CM | POA: Diagnosis present

## 2018-04-08 DIAGNOSIS — Z882 Allergy status to sulfonamides status: Secondary | ICD-10-CM

## 2018-04-08 DIAGNOSIS — G44009 Cluster headache syndrome, unspecified, not intractable: Secondary | ICD-10-CM | POA: Diagnosis present

## 2018-04-08 DIAGNOSIS — M549 Dorsalgia, unspecified: Secondary | ICD-10-CM | POA: Diagnosis present

## 2018-04-08 DIAGNOSIS — R45851 Suicidal ideations: Secondary | ICD-10-CM | POA: Diagnosis present

## 2018-04-08 DIAGNOSIS — Y902 Blood alcohol level of 40-59 mg/100 ml: Secondary | ICD-10-CM | POA: Diagnosis present

## 2018-04-08 DIAGNOSIS — Z881 Allergy status to other antibiotic agents status: Secondary | ICD-10-CM

## 2018-04-08 DIAGNOSIS — M4800 Spinal stenosis, site unspecified: Secondary | ICD-10-CM | POA: Diagnosis present

## 2018-04-08 DIAGNOSIS — Z590 Homelessness: Secondary | ICD-10-CM | POA: Diagnosis not present

## 2018-04-08 DIAGNOSIS — B20 Human immunodeficiency virus [HIV] disease: Secondary | ICD-10-CM | POA: Diagnosis present

## 2018-04-08 DIAGNOSIS — F1410 Cocaine abuse, uncomplicated: Secondary | ICD-10-CM | POA: Diagnosis not present

## 2018-04-08 DIAGNOSIS — F1020 Alcohol dependence, uncomplicated: Secondary | ICD-10-CM | POA: Diagnosis present

## 2018-04-08 DIAGNOSIS — F1110 Opioid abuse, uncomplicated: Secondary | ICD-10-CM | POA: Diagnosis not present

## 2018-04-08 DIAGNOSIS — Z91013 Allergy to seafood: Secondary | ICD-10-CM | POA: Diagnosis not present

## 2018-04-08 DIAGNOSIS — F1310 Sedative, hypnotic or anxiolytic abuse, uncomplicated: Secondary | ICD-10-CM | POA: Diagnosis not present

## 2018-04-08 MED ORDER — OXYCODONE-ACETAMINOPHEN 5-325 MG PO TABS
1.0000 | ORAL_TABLET | Freq: Once | ORAL | Status: AC
  Administered 2018-04-08: 1 via ORAL
  Filled 2018-04-08: qty 1

## 2018-04-08 MED ORDER — ONDANSETRON 4 MG PO TBDP: 4.0000 mg | ORAL_TABLET | Freq: Four times a day (QID) | ORAL | Status: DC | PRN

## 2018-04-08 MED ORDER — CHLORDIAZEPOXIDE HCL 25 MG PO CAPS
25.0000 mg | ORAL_CAPSULE | Freq: Every day | ORAL | Status: AC
  Administered 2018-04-11: 25 mg via ORAL
  Filled 2018-04-08: qty 1

## 2018-04-08 MED ORDER — DARUNAVIR-COBICISTAT 800-150 MG PO TABS
1.0000 | ORAL_TABLET | Freq: Every day | ORAL | Status: DC
  Administered 2018-04-09 – 2018-04-11 (×3): 1 via ORAL
  Filled 2018-04-08 (×5): qty 1

## 2018-04-08 MED ORDER — EMTRICITABINE-TENOFOVIR AF 200-25 MG PO TABS
1.0000 | ORAL_TABLET | Freq: Every day | ORAL | Status: DC
  Administered 2018-04-09 – 2018-04-11 (×3): 1 via ORAL
  Filled 2018-04-08 (×5): qty 1

## 2018-04-08 MED ORDER — GABAPENTIN 300 MG PO CAPS
300.0000 mg | ORAL_CAPSULE | Freq: Three times a day (TID) | ORAL | Status: DC
  Administered 2018-04-08 – 2018-04-11 (×9): 300 mg via ORAL
  Filled 2018-04-08 (×14): qty 1

## 2018-04-08 MED ORDER — GABAPENTIN 400 MG PO CAPS
400.0000 mg | ORAL_CAPSULE | Freq: Three times a day (TID) | ORAL | Status: DC
  Administered 2018-04-08: 400 mg via ORAL
  Filled 2018-04-08: qty 1

## 2018-04-08 MED ORDER — BACLOFEN 10 MG PO TABS
10.0000 mg | ORAL_TABLET | Freq: Three times a day (TID) | ORAL | Status: DC
  Administered 2018-04-08 – 2018-04-11 (×9): 10 mg via ORAL
  Filled 2018-04-08 (×14): qty 1

## 2018-04-08 MED ORDER — CHLORDIAZEPOXIDE HCL 25 MG PO CAPS
25.0000 mg | ORAL_CAPSULE | Freq: Three times a day (TID) | ORAL | Status: AC
  Administered 2018-04-09 (×3): 25 mg via ORAL
  Filled 2018-04-08 (×3): qty 1

## 2018-04-08 MED ORDER — HYDROXYZINE HCL 25 MG PO TABS
25.0000 mg | ORAL_TABLET | Freq: Four times a day (QID) | ORAL | Status: DC | PRN
  Administered 2018-04-08 – 2018-04-10 (×2): 25 mg via ORAL
  Filled 2018-04-08: qty 1

## 2018-04-08 MED ORDER — CHLORDIAZEPOXIDE HCL 25 MG PO CAPS
25.0000 mg | ORAL_CAPSULE | Freq: Four times a day (QID) | ORAL | Status: DC | PRN
  Administered 2018-04-09: 25 mg via ORAL
  Filled 2018-04-08: qty 1

## 2018-04-08 MED ORDER — MAGNESIUM HYDROXIDE 400 MG/5ML PO SUSP: 30.0000 mL | Freq: Every day | ORAL | Status: DC | PRN

## 2018-04-08 MED ORDER — ADULT MULTIVITAMIN W/MINERALS CH
1.0000 | ORAL_TABLET | Freq: Every day | ORAL | Status: DC
  Administered 2018-04-08 – 2018-04-11 (×4): 1 via ORAL
  Filled 2018-04-08 (×6): qty 1

## 2018-04-08 MED ORDER — BACLOFEN 10 MG PO TABS
10.0000 mg | ORAL_TABLET | Freq: Three times a day (TID) | ORAL | Status: DC
  Administered 2018-04-08: 10 mg via ORAL
  Filled 2018-04-08: qty 1

## 2018-04-08 MED ORDER — ALUM & MAG HYDROXIDE-SIMETH 200-200-20 MG/5ML PO SUSP: 30.0000 mL | ORAL | Status: DC | PRN

## 2018-04-08 MED ORDER — CHLORDIAZEPOXIDE HCL 25 MG PO CAPS
25.0000 mg | ORAL_CAPSULE | Freq: Four times a day (QID) | ORAL | Status: AC
  Administered 2018-04-08 (×2): 25 mg via ORAL
  Filled 2018-04-08 (×2): qty 1

## 2018-04-08 MED ORDER — LOPERAMIDE HCL 2 MG PO CAPS: 2.0000 mg | ORAL_CAPSULE | ORAL | Status: DC | PRN

## 2018-04-08 MED ORDER — IBUPROFEN 800 MG PO TABS: 800.0000 mg | ORAL_TABLET | Freq: Four times a day (QID) | ORAL | Status: DC | PRN

## 2018-04-08 MED ORDER — TOPIRAMATE 25 MG PO TABS
50.0000 mg | ORAL_TABLET | Freq: Two times a day (BID) | ORAL | Status: DC
  Administered 2018-04-08 – 2018-04-09 (×2): 50 mg via ORAL
  Filled 2018-04-08 (×4): qty 2

## 2018-04-08 MED ORDER — ACETAMINOPHEN 325 MG PO TABS
650.0000 mg | ORAL_TABLET | Freq: Four times a day (QID) | ORAL | Status: DC | PRN
  Administered 2018-04-09 – 2018-04-11 (×6): 650 mg via ORAL
  Filled 2018-04-08 (×6): qty 2

## 2018-04-08 MED ORDER — ASPIRIN EC 81 MG PO TBEC
81.0000 mg | DELAYED_RELEASE_TABLET | Freq: Every day | ORAL | Status: DC
  Administered 2018-04-08: 81 mg via ORAL
  Filled 2018-04-08: qty 1

## 2018-04-08 MED ORDER — DIPHENHYDRAMINE HCL 50 MG PO CAPS
50.0000 mg | ORAL_CAPSULE | Freq: Every evening | ORAL | Status: DC | PRN
  Administered 2018-04-08 – 2018-04-10 (×3): 50 mg via ORAL
  Filled 2018-04-08 (×3): qty 1
  Filled 2018-04-08: qty 2
  Filled 2018-04-08 (×7): qty 1

## 2018-04-08 MED ORDER — CHLORDIAZEPOXIDE HCL 25 MG PO CAPS
25.0000 mg | ORAL_CAPSULE | ORAL | Status: AC
  Administered 2018-04-10 (×2): 25 mg via ORAL
  Filled 2018-04-08 (×2): qty 1

## 2018-04-08 NOTE — ED Notes (Signed)
Patient requesting pain medication for his back and legs. RN made patient aware that nothing for pain was ordered at this time or PRN but she would contact the MD to ask about something for pain.  RN sent MD a message

## 2018-04-08 NOTE — Consult Note (Addendum)
[facility] Face-to-Face Psychiatry Consult   Reason for Consult:  Suicidal ideations with a plan Referring Physician:  EDP Patient Identification: Lawson Hogrefe MRN:  [id number] Principal Diagnosis: MDD (major depressive disorder), recurrent severe, without psychosis (HCC) Diagnosis:  Principal Problem:   MDD (major depressive disorder), recurrent severe, without psychosis (HCC)   Total Time spent with patient: 45 minutes  Subjective:   [name] is a 58 y.o. male patient admitted with suicide threat.  HPI:  58 yo male who presented to the ED with back pain and suicide threat.  His depression has increased with back pain to the point he wants to kill himself by overdosing on medications.  Frustrated they did not see anything on his back examination as he cannot get any relief from the pain.  10/10 depression with anxiety of 8/10.  No homicidal ideations, hallucinations, or withdrawal symptoms.  He does drink at times to help the pain.  Past history of depression, inpatient recommended.  Past Psychiatric History: depression, alcohol abuse  Risk to Self: Suicidal Ideation: Yes-Currently Present Suicidal Intent: No Is patient at risk for suicide?: Yes Suicidal Plan?: Yes-Currently Present Specify Current Suicidal Plan: Overdose on OTC and prescribed meds Access to Means: Yes Specify Access to Suicidal Means: Prescribed and OTC meds What has been your use of drugs/alcohol within the last 12 months?: Alcohol How many times?: 1 Triggers for Past Attempts: Unknown Intentional Self Injurious Behavior: None Risk to Others: Homicidal Ideation: No Thoughts of Harm to Others: No Current Homicidal Intent: No Current Homicidal Plan: No Access to Homicidal Means: No History of harm to others?: No Assessment of Violence: None Noted Does patient have access to weapons?: No Criminal Charges Pending?: No Does patient have a court date: No Prior Inpatient Therapy: Prior Inpatient Therapy:  Yes Prior Therapy Dates: 2019 and other Prior Therapy Facilty/Provider(s): [facility] Reason for Treatment: SI Prior Outpatient Therapy: Prior Outpatient Therapy: No Does patient have an ACCT team?: No Does patient have Intensive In-House Services?  : No Does patient have [facility] services? : No Does patient have P4CC services?: No  Past Medical History:  Past Medical History:  Diagnosis Date  . 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 Hemorrhoids   . HIV (human immunodeficiency virus infection) (HCC)   . Stenosis of cervical spine    withb myelopathy    Past Surgical History:  Procedure Laterality Date  . ANTERIOR CERVICAL DECOMP/DISCECTOMY FUSION N/A 07/06/2017   Procedure: ANTERIOR CERVICAL DECOMPRESSION/DISCECTOMY FUSION C3-4/C4-5 2 LEVELS;  Surgeon: Lisbeth Renshaw, MD;  Location: [facility] OR;  Service: Neurosurgery;  Laterality: N/A;  . HERNIA REPAIR  97   lt ing  . RECTAL EXAM UNDER ANESTHESIA N/A 04/24/2014   Procedure:  ligation of bleeding vessels;  Surgeon: Almond Lint, MD;  Location: [facility]S;  Service: General;  Laterality: N/A;  . TENDON REPAIR     2006-lt index  . WART FULGURATION  07/06/2011   Procedure: FULGURATION ANAL WART;  Surgeon: Shelly Rubenstein, MD;  Location: [facility];  Service: General;  Laterality: N/A;  excision anal condyloma  . WART FULGURATION N/A 04/23/2014   Procedure: EXCISION OF ANAL CONDYLOMA;  Surgeon: Abigail Miyamoto, MD;  Location: [facility];  Service: General;  Laterality: N/A;   Family History:  Family History  Problem Relation Age of Onset  . Cancer Mother        brain  . Cancer Sister        breast   Family Psychiatric  History: none Social History:  Social History   Substance and Sexual Activity  Alcohol Use Yes  . Alcohol/week: 7.0 - 8.0 standard drinks  . Types: 7 - 8 Cans of beer per week   Comment: ''I can drink a case a day''     Social History   Substance and Sexual Activity  Drug Use No    Social History    Socioeconomic History  . Marital status: Single    Spouse name: Not on file  . Number of children: Not on file  . Years of education: Not on file  . Highest education level: Not on file  Occupational History  . Occupation: Unemployed  Social Needs  . Financial resource strain: Not on file  . Food insecurity:    Worry: Not on file    Inability: Not on file  . Transportation needs:    Medical: Not on file    Non-medical: Not on file  Tobacco Use  . Smoking status: Current Every Day Smoker    Packs/day: 0.50    Years: 25.00    Pack years: 12.50    Types: Cigarettes  . Smokeless tobacco: Never Used  Substance and Sexual Activity  . Alcohol use: Yes    Alcohol/week: 7.0 - 8.0 standard drinks    Types: 7 - 8 Cans of beer per week    Comment: ''I can drink a case a day''  . Drug use: No  . Sexual activity: Not Currently  Lifestyle  . Physical activity:    Days per week: Not on file    Minutes per session: Not on file  . Stress: Not on file  Relationships  . Social connections:    Talks on phone: Not on file    Gets together: Not on file    Attends religious service: Not on file    Active member of club or organization: Not on file    Attends meetings of clubs or organizations: Not on file    Relationship status: Not on file  Other Topics Concern  . Not on file  Social History Narrative   Pt lives alone; receives social security.   Additional Social History: N/A    Allergies:   Allergies  Allergen Reactions  . Shellfish Allergy Anaphylaxis and Swelling    Swelling everywhere  . Sulfa Antibiotics Anaphylaxis, Hives, Itching and Swelling    Swelling all over   . Fish Allergy Swelling  . Clindamycin Diarrhea    Labs:  Results for orders placed or performed during the hospital encounter of 04/07/18 (from the past 48 hour(s))  CBC with Differential     Status: Abnormal   Collection Time: 04/07/18  1:22 PM  Result Value Ref Range   WBC 6.0 4.0 - 10.5 K/uL    RBC 4.92 4.22 - 5.81 MIL/uL   Hemoglobin 11.7 (L) 13.0 - 17.0 g/dL   HCT 09.8 (L) 11.9 - 14.7 %   MCV 78.3 (L) 80.0 - 100.0 fL   MCH 23.8 (L) 26.0 - 34.0 pg   MCHC 30.4 30.0 - 36.0 g/dL   RDW 82.9 (H) 56.2 - 13.0 %   Platelets 444 (H) 150 - 400 K/uL   nRBC 0.0 0.0 - 0.2 %   Neutrophils Relative % 58 %   Neutro Abs 3.5 1.7 - 7.7 K/uL   Lymphocytes Relative 33 %   Lymphs Abs 2.0 0.7 - 4.0 K/uL   Monocytes Relative 7 %   Monocytes Absolute 0.4 0.1 - 1.0 K/uL   Eosinophils Relative 1 %  Eosinophils Absolute 0.1 0.0 - 0.5 K/uL   Basophils Relative 1 %   Basophils Absolute 0.0 0.0 - 0.1 K/uL   Immature Granulocytes 0 %   Abs Immature Granulocytes 0.01 0.00 - 0.07 K/uL    Comment: Performed at [facility], [address], [place], [place] [address]  Comprehensive metabolic panel     Status: Abnormal   Collection Time: 04/07/18  1:22 PM  Result Value Ref Range   Sodium 137 135 - 145 mmol/L   Potassium 3.3 (L) 3.5 - 5.1 mmol/L   Chloride 103 98 - 111 mmol/L   CO2 23 22 - 32 mmol/L   Glucose, Bld 98 70 - 99 mg/dL   BUN 19 6 - 20 mg/dL   Creatinine, Ser 6.04 0.61 - 1.24 mg/dL   Calcium 9.3 8.9 - 54.0 mg/dL   Total Protein 7.8 6.5 - 8.1 g/dL   Albumin 4.4 3.5 - 5.0 g/dL   AST 24 15 - 41 U/L   ALT 18 0 - 44 U/L   Alkaline Phosphatase 46 38 - 126 U/L   Total Bilirubin 0.9 0.3 - 1.2 mg/dL   GFR calc non Af Amer >60 >60 mL/min   GFR calc Af Amer >60 >60 mL/min   Anion gap 11 5 - 15    Comment: Performed at [facility], [address] [place], [place] [address]  Rapid urine drug screen (hospital performed)     Status: Abnormal   Collection Time: 04/07/18  4:08 PM  Result Value Ref Range   Opiates POSITIVE (A) NONE DETECTED   Cocaine NONE DETECTED NONE DETECTED   Benzodiazepines POSITIVE (A) NONE DETECTED   Amphetamines NONE DETECTED NONE DETECTED   Tetrahydrocannabinol NONE DETECTED NONE DETECTED   Barbiturates NONE DETECTED NONE DETECTED     Comment: (NOTE) DRUG SCREEN FOR MEDICAL PURPOSES ONLY.  IF CONFIRMATION IS NEEDED FOR ANY PURPOSE, NOTIFY LAB WITHIN 5 DAYS. LOWEST DETECTABLE LIMITS FOR URINE DRUG SCREEN Drug Class                     Cutoff (ng/mL) Amphetamine and metabolites    1000 Barbiturate and metabolites    200 Benzodiazepine                 200 Tricyclics and metabolites     300 Opiates and metabolites        300 Cocaine and metabolites        300 THC                            50 Performed at [facility], [address] [place], [place] [address]   Ethanol     Status: Abnormal   Collection Time: 04/07/18  4:27 PM  Result Value Ref Range   Alcohol, Ethyl (B) 59 (H) <10 mg/dL    Comment: (NOTE) Lowest detectable limit for serum alcohol is 10 mg/dL. For medical purposes only. Performed at [facility], [address], [place], [place] [address]   Salicylate level     Status: None   Collection Time: 04/07/18  4:27 PM  Result Value Ref Range   Salicylate Lvl <7.0 2.8 - 30.0 mg/dL    Comment: Performed at [facility], [address] [place], [place] [address]  Acetaminophen level     Status: Abnormal   Collection Time: 04/07/18  4:27 PM  Result Value Ref  Range   Acetaminophen (Tylenol), Serum <10 (L) 10 - 30 ug/mL    Comment: (NOTE) Therapeutic concentrations vary significantly. A range of 10-30 ug/mL  may be an effective concentration for many patients. However, some  are best treated at concentrations outside of this range. Acetaminophen concentrations >150 ug/mL at 4 hours after ingestion  and >50 ug/mL at 12 hours after ingestion are often associated with  toxic reactions. Performed at [facility], [address], [place], [place] [address]     Current Facility-Administered Medications  Medication Dose Route Frequency Provider Last Rate Last Dose  . alum & mag hydroxide-simeth (MAALOX/MYLANTA) 200-200-20  MG/5ML suspension 30 mL  30 mL Oral Q6H PRN Shaune Pollack, MD      . aspirin EC tablet 81 mg  81 mg Oral Daily Tilden Fossa, MD   81 mg at 04/08/18 0944  . baclofen (LIORESAL) tablet 10 mg  10 mg Oral TID Tilden Fossa, MD   10 mg at 04/08/18 0858  . buPROPion (WELLBUTRIN XL) 24 hr tablet 150 mg  150 mg Oral Daily Shaune Pollack, MD   150 mg at 04/08/18 0858  . darunavir-cobicistat (PREZCOBIX) 800-150 MG per tablet 1 tablet  1 tablet Oral Daily Shaune Pollack, MD   1 tablet at 04/08/18 0904  . emtricitabine-tenofovir AF (DESCOVY) 200-25 MG per tablet 1 tablet  1 tablet Oral Daily Shaune Pollack, MD   1 tablet at 04/08/18 0904  . gabapentin (NEURONTIN) capsule 400 mg  400 mg Oral TID Tilden Fossa, MD   400 mg at 04/08/18 0857  . ibuprofen (ADVIL,MOTRIN) tablet 800 mg  800 mg Oral Q6H PRN Charm Rings, NP      . thiamine (VITAMIN B-1) tablet 100 mg  100 mg Oral Daily Shaune Pollack, MD   100 mg at 04/08/18 6045   Or  . thiamine (B-1) injection 100 mg  100 mg Intravenous Daily Shaune Pollack, MD       Current Outpatient Medications  Medication Sig Dispense Refill  . baclofen (LIORESAL) 10 MG tablet Take 10 mg by mouth 3 (three) times daily.    Marland Kitchen buPROPion (WELLBUTRIN XL) 150 MG 24 hr tablet Take 1 tablet (150 mg total) by mouth daily. (Patient taking differently: Take 300 mg by mouth daily. ) 30 tablet 0  . darunavir-cobicistat (PREZCOBIX) 800-150 MG tablet Take 1 tablet by mouth daily. Swallow whole. Do NOT crush, break or chew tablets. Take with food, take with Descovy. 30 tablet 2  . emtricitabine-tenofovir AF (DESCOVY) 200-25 MG tablet Take 1 tablet by mouth daily. Take with Prezcobix. 30 tablet 2  . gabapentin (NEURONTIN) 100 MG capsule Take 400 mg by mouth 3 (three) times daily.    . hydrOXYzine (ATARAX/VISTARIL) 25 MG tablet Take 1 tablet (25 mg total) by mouth every 6 (six) hours as needed (anxiety/agitation or CIWA < or = 10). (Patient taking differently: Take 25-50 mg by  mouth every 6 (six) hours as needed (anxiety/agitation or CIWA < or = 10). ) 30 tablet 0    Musculoskeletal: Strength & Muscle Tone: within normal limits Gait & Station: normal Patient leans: N/A  Psychiatric Specialty Exam: Physical Exam  Nursing note and vitals reviewed. Constitutional: He is oriented to person, place, and time. He appears well-developed and well-nourished.  HENT:  Head: Normocephalic.  Neck: Normal range of motion.  Respiratory: Effort normal.  Musculoskeletal: Normal range of motion.  Neurological: He is alert and oriented to person, place, and time.  Psychiatric: His speech  is normal and behavior is normal. Judgment normal. His mood appears anxious. Cognition and memory are normal. He exhibits a depressed mood. He expresses suicidal ideation. He expresses suicidal plans.    Review of Systems  Musculoskeletal: Positive for back pain.  Psychiatric/Behavioral: Positive for depression, substance abuse and suicidal ideas. The patient is nervous/anxious.   All other systems reviewed and are negative.   Blood pressure 126/82, pulse 90, temperature 97.9 F (36.6 C), temperature source Oral, resp. rate 16, SpO2 100 %.There is no height or weight on file to calculate BMI.  General Appearance: Casual  Eye Contact:  Good  Speech:  Normal Rate  Volume:  Decreased  Mood:  Anxious and Depressed  Affect:  Congruent  Thought Process:  Coherent and Descriptions of Associations: Intact  Orientation:  Full (Time, Place, and Person)  Thought Content:  Rumination  Suicidal Thoughts:  Yes.  with intent/plan  Homicidal Thoughts:  No  Memory:  Immediate;   Fair Recent;   Fair Remote;   Fair  Judgement:  Poor  Insight:  Fair  Psychomotor Activity:  Normal  Concentration:  Concentration: Fair and Attention Span: Fair  Recall:  [employer] of Knowledge:  Fair  Language:  Good  Akathisia:  No  Handed:  Right  AIMS (if indicated):   N/A  Assets:  Housing Leisure  Time Physical Health Resilience Social Support  ADL's:  Intact  Cognition:  WNL  Sleep:   N/A     Treatment Plan Summary: Daily contact with patient to assess and evaluate symptoms and progress in treatment, Medication management and Plan major depressive disorder, recurrent, severe without psychosis:  -Wellbutrin 150 mg daily  Alcohol dependency: -Continue gabapentin 400 mg TID  Disposition: Recommend psychiatric Inpatient admission when medically cleared.  Nanine Means, NP 04/08/2018 11:36 AM   Patient seen face-to-face for psychiatric evaluation, chart reviewed and case discussed with the physician extender and developed treatment plan. Reviewed the information documented and agree with the treatment plan.  Juanetta Beets, DO 04/09/18 9:40 AM

## 2018-04-08 NOTE — BHH Suicide Risk Assessment (Signed)
[facility] Admission Suicide Risk Assessment   Nursing information obtained from:  Patient Demographic factors:  Male, Living alone Current Mental Status:  NA Loss Factors:  Decline in physical health, Financial problems / change in socioeconomic status Historical Factors:  Prior suicide attempts, Impulsivity Risk Reduction Factors:  Sense of responsibility to family, Positive therapeutic relationship  Total Time spent with patient: 45 minutes Principal Problem: Alcohol dependence and withdrawal Diagnosis:  Active Problems:   MDD (major depressive disorder), recurrent severe, without psychosis (HCC)   Major depressive disorder, recurrent severe without psychotic features (HCC)  Subjective Data: Patient states he had suicidal thoughts prior to coming in but is "okay now" can contract no acute suicidal thinking plans or intent while here  Continued Clinical Symptoms:    The "Alcohol Use Disorders Identification Test", Guidelines for Use in Primary Care, Second Edition.  World Science writer [facility]). Score between 0-7:  no or low risk or alcohol related problems. Score between 8-15:  moderate risk of alcohol related problems. Score between 16-19:  high risk of alcohol related problems. Score 20 or above:  warrants further diagnostic evaluation for alcohol dependence and treatment.   CLINICAL FACTORS:   Alcohol/Substance Abuse/Dependencies   COGNITIVE FEATURES THAT CONTRIBUTE TO RISK:  Polarized thinking    SUICIDE RISK:   Minimal: No identifiable suicidal ideation.  Patients presenting with no risk factors but with morbid ruminations; may be classified as minimal risk based on the severity of the depressive symptoms  PLAN OF CARE: Begin with detox address depression and chronic pain complaints continue HIV meds  I certify that inpatient services furnished can reasonably be expected to improve the patient's condition.   Malvin Johns, MD 04/08/2018, 2:07 PM

## 2018-04-08 NOTE — ED Notes (Signed)
[facility] called for transport. 

## 2018-04-08 NOTE — ED Notes (Signed)
Patient speaking to sister on phone. Pt calm and cooperative at this time.

## 2018-04-08 NOTE — BH Assessment (Signed)
[facility] Assessment Progress Note  Per Juanetta Beets, DO, this pt requires psychiatric hospitalization at this time.  Berneice Heinrich, RN, [facility] has assigned pt to [facility] Rm [id number].  Pt has signed Voluntary Admission and Consent for Treatment, as well as Consent to Release Information to his sister, and signed forms have been faxed to [facility].  Pt's nurse, Waldo Laine has been notified, and agrees to send original paperwork along with pt via Juel Burrow, and to call report to [phone].  Doylene Canning, [place] Behavioral Health Coordinator [phone]

## 2018-04-08 NOTE — ED Notes (Signed)
BH provider at bedside

## 2018-04-08 NOTE — H&P (Signed)
Psychiatric Admission Assessment Adult  Patient Identification: [name] MRN:  [id number] Date of Evaluation:  04/08/2018 Chief Complaint:  MDD ALCOHOL USE DISORDER Principal Diagnosis: Alcohol dependence Diagnosis:  Active Problems:   MDD (major depressive disorder), recurrent severe, without psychosis (HCC)   Major depressive disorder, recurrent severe without psychotic features (HCC)  History of Present Illness:  Mr. [name] is 58 years of age known to me due to prior healthcare encounters he suffers from chronic HIV infection, chronic alcohol abuse and dependency, recurrent depression he is currently unemployed but states he has his own home. He presented initially through the emergency department stating he has back pain from spinal stenosis but then acknowledged thoughts of overdosing on prescription and over-the-counter medications and he also endorsed drinking at least a case of beer a day.  His last drink was 04/06/2018 his alcohol level was 59 drug screen reflected the use of benzodiazepines and opiates.  Patient states he is never had seizures or DTs to the best of his knowledge but when coming off alcohol will hear voices "laughing at him" he reports that he can be safe here he does not currently have suicidal thoughts plans or intent. States he feels like he is "falling apart" with HIV, chronic pain issues, and states that though it is a his home his cousin has moved and and there are drug users including his cousin coming in and out of the house so he does not feel it is a good environment for recovery obviously.  Cording to our assessment team   [name] [name] is a 58 y.o. male who presented to [facility] on voluntary basis due to ongoing physical pain.  During assessment, Pt endorsed suicidal ideation with plan.  [place].  He is unemployed and receives Therapist, artsocial security payments.  He is not followed by any outpatient psychiatric services.  Pt reported that he  came to the hospital due to significant back pain, which he described as getting worse.  Pt said that as a result of the pain, he feels suicidal and has a plan to overdose on his prescribed and OTC medications.  Pt endorsed the following symptoms:  Suicidal ideation with plan; persistent and unremitting despondency; isolation; insomnia; hopelessness and worthlessness; irritability.  Pt also endorsed daily consumption of alcohol -- up to a case of beer per day.  Last use was 04/06/2018.  Pt's UDS was .58.  Pt also endorsed auditory hallucination -- ''voices laughing.''  Pt endorsed the belief that people are talking about him (possible paranoid ideation).  Pt stated that he does not believe he is properly diagnosed and treated, and if he does not get help, he will kill himself.  Pt denied any current psychiatric care, but he stated that he is supposed to take ''benadryl and serotonin (sic).''    Pt reported that he attempted suicide once by hanging in the late 1990s, and he was hospitalized for suicidal ideation in 2019.  During assessment, Pt presented as alert and oriented.  He had fair eye contact.  Demeanor was irritable.  Pt's mood was depressed and irritable, and affect was mood congruent.  Pt endorsed suicidal ideation, despondency, and other symptoms, including episodes of auditory hallucination and daily alcohol use.  Pt's speech was normal in rate, rhythm, and volume.  Thought processes were within normal range, and thought content was logical and goal-oriented.  There was no evidence of delusion.  Pt's memory and concentration were intact. Insight, judgment, and impulse control were fair  to poor.  acconsulted with Irving Burton, NP, who determined that Pt should remain in the ED, stabilize, sober, and have AM psych eval.  Diagnosis: Major Depressive Disorder, Recurrent, Severe, w/psychotic features; Alcohol Use Disorder, Severe  Associated Signs/Symptoms: Depression Symptoms:  insomnia, (Hypo)  Manic Symptoms:  n/a Anxiety Symptoms:  n/a Psychotic Symptoms:  poss aud hall vs sxs exagerration PTSD Symptoms: NA Total Time spent with patient: 45 minutes  Is the patient at risk to self? Yes.    Has the patient been a risk to self in the past 6 months? No.  Has the patient been a risk to self within the distant past? No.  Is the patient a risk to others? No.  Has the patient been a risk to others in the past 6 months? No.  Has the patient been a risk to others within the distant past? No.   Alcohol Screening: Patient refused Alcohol Screening Tool: Yes 1. How often do you have a drink containing alcohol?: Never 2. How many drinks containing alcohol do you have on a typical day when you are drinking?: 1 or 2 3. How often do you have six or more drinks on one occasion?: Never AUDIT-C Score: 0 Alcohol Brief Interventions/Follow-up: Patient Refused Substance Abuse History in the last 12 months:  Yes.   Consequences of Substance Abuse: Medical Consequences:  needs detox Previous Psychotropic Medications: Yes  Psychological Evaluations: No  Past Medical History:  Past Medical History:  Diagnosis Date  . 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 Hemorrhoids   . HIV (human immunodeficiency virus infection) (HCC)   . Stenosis of cervical spine    withb myelopathy    Past Surgical History:  Procedure Laterality Date  . ANTERIOR CERVICAL DECOMP/DISCECTOMY FUSION N/A 07/06/2017   Procedure: ANTERIOR CERVICAL DECOMPRESSION/DISCECTOMY FUSION C3-4/C4-5 2 LEVELS;  Surgeon: Lisbeth Renshaw, MD;  Location: [facility] OR;  Service: Neurosurgery;  Laterality: N/A;  . HERNIA REPAIR  97   lt ing  . RECTAL EXAM UNDER ANESTHESIA N/A 04/24/2014   Procedure:  ligation of bleeding vessels;  Surgeon: Almond Lint, MD;  Location: [facility] ORS;  Service: General;  Laterality: N/A;  . TENDON REPAIR     2006-lt index  . WART FULGURATION  07/06/2011   Procedure: FULGURATION ANAL WART;  Surgeon: Shelly Rubenstein, MD;   Location: [facility];  Service: General;  Laterality: N/A;  excision anal condyloma  . WART FULGURATION N/A 04/23/2014   Procedure: EXCISION OF ANAL CONDYLOMA;  Surgeon: Abigail Miyamoto, MD;  Location: [facility];  Service: General;  Laterality: N/A;   Family History:  Family History  Problem Relation Age of Onset  . Cancer Mother        brain  . Cancer Sister        breast  Social History:  Social History   Substance and Sexual Activity  Alcohol Use Yes  . Alcohol/week: 7.0 - 8.0 standard drinks  . Types: 7 - 8 Cans of beer per week   Comment: ''I can drink a case a day''     Social History   Substance and Sexual Activity  Drug Use No    Additional Social History:                           Allergies:   Allergies  Allergen Reactions  . Shellfish Allergy Anaphylaxis and Swelling    Swelling everywhere  . Sulfa Antibiotics Anaphylaxis, Hives, Itching and Swelling  Swelling all over   . Fish Allergy Swelling  . Clindamycin Diarrhea   Lab Results:  Results for orders placed or performed during the hospital encounter of 04/07/18 (from the past 48 hour(s))  CBC with Differential     Status: Abnormal   Collection Time: 04/07/18  1:22 PM  Result Value Ref Range   WBC 6.0 4.0 - 10.5 K/uL   RBC 4.92 4.22 - 5.81 MIL/uL   Hemoglobin 11.7 (L) 13.0 - 17.0 g/dL   HCT 86.3 (L) 81.7 - 71.1 %   MCV 78.3 (L) 80.0 - 100.0 fL   MCH 23.8 (L) 26.0 - 34.0 pg   MCHC 30.4 30.0 - 36.0 g/dL   RDW 65.7 (H) 90.3 - 83.3 %   Platelets 444 (H) 150 - 400 K/uL   nRBC 0.0 0.0 - 0.2 %   Neutrophils Relative % 58 %   Neutro Abs 3.5 1.7 - 7.7 K/uL   Lymphocytes Relative 33 %   Lymphs Abs 2.0 0.7 - 4.0 K/uL   Monocytes Relative 7 %   Monocytes Absolute 0.4 0.1 - 1.0 K/uL   Eosinophils Relative 1 %   Eosinophils Absolute 0.1 0.0 - 0.5 K/uL   Basophils Relative 1 %   Basophils Absolute 0.0 0.0 - 0.1 K/uL   Immature Granulocytes 0 %   Abs Immature Granulocytes 0.01 0.00 -  0.07 K/uL    Comment: Performed at [facility], [address] [place], [place] [address]  Comprehensive metabolic panel     Status: Abnormal   Collection Time: 04/07/18  1:22 PM  Result Value Ref Range   Sodium 137 135 - 145 mmol/L   Potassium 3.3 (L) 3.5 - 5.1 mmol/L   Chloride 103 98 - 111 mmol/L   CO2 23 22 - 32 mmol/L   Glucose, Bld 98 70 - 99 mg/dL   BUN 19 6 - 20 mg/dL   Creatinine, Ser 1.91 0.61 - 1.24 mg/dL   Calcium 9.3 8.9 - 66.0 mg/dL   Total Protein 7.8 6.5 - 8.1 g/dL   Albumin 4.4 3.5 - 5.0 g/dL   AST 24 15 - 41 U/L   ALT 18 0 - 44 U/L   Alkaline Phosphatase 46 38 - 126 U/L   Total Bilirubin 0.9 0.3 - 1.2 mg/dL   GFR calc non Af Amer >60 >60 mL/min   GFR calc Af Amer >60 >60 mL/min   Anion gap 11 5 - 15    Comment: Performed at [facility], [address], [place], [place] [address]  Rapid urine drug screen (hospital performed)     Status: Abnormal   Collection Time: 04/07/18  4:08 PM  Result Value Ref Range   Opiates POSITIVE (A) NONE DETECTED   Cocaine NONE DETECTED NONE DETECTED   Benzodiazepines POSITIVE (A) NONE DETECTED   Amphetamines NONE DETECTED NONE DETECTED   Tetrahydrocannabinol NONE DETECTED NONE DETECTED   Barbiturates NONE DETECTED NONE DETECTED    Comment: (NOTE) DRUG SCREEN FOR MEDICAL PURPOSES ONLY.  IF CONFIRMATION IS NEEDED FOR ANY PURPOSE, NOTIFY LAB WITHIN 5 DAYS. LOWEST DETECTABLE LIMITS FOR URINE DRUG SCREEN Drug Class                     Cutoff (ng/mL) Amphetamine and metabolites    1000 Barbiturate and metabolites    200 Benzodiazepine                 200 Tricyclics and metabolites  300 Opiates and metabolites        300 Cocaine and metabolites        300 THC                            50 Performed at [facility], [address] [place], [place] [address]   Ethanol     Status: Abnormal   Collection Time: 04/07/18  4:27 PM  Result Value Ref Range   Alcohol,  Ethyl (B) 59 (H) <10 mg/dL    Comment: (NOTE) Lowest detectable limit for serum alcohol is 10 mg/dL. For medical purposes only. Performed at [facility], [address], [place], [place] [address]   Salicylate level     Status: None   Collection Time: 04/07/18  4:27 PM  Result Value Ref Range   Salicylate Lvl <7.0 2.8 - 30.0 mg/dL    Comment: Performed at [facility], [address] [place], [place] [address]  Acetaminophen level     Status: Abnormal   Collection Time: 04/07/18  4:27 PM  Result Value Ref Range   Acetaminophen (Tylenol), Serum <10 (L) 10 - 30 ug/mL    Comment: (NOTE) Therapeutic concentrations vary significantly. A range of 10-30 ug/mL  may be an effective concentration for many patients. However, some  are best treated at concentrations outside of this range. Acetaminophen concentrations >150 ug/mL at 4 hours after ingestion  and >50 ug/mL at 12 hours after ingestion are often associated with  toxic reactions. Performed at [facility], [address] [place], [place] [address]     Blood Alcohol level:  Lab Results  Component Value Date   ETH 59 (H) 04/07/2018   ETH 173 (H) 07/31/2017    Metabolic Disorder Labs:  Lab Results  Component Value Date   HGBA1C 6.0 (H) 06/27/2017   MPG 125.5 06/27/2017   No results found for: PROLACTIN Lab Results  Component Value Date   CHOL 207 (H) 02/26/2015   TRIG 154 (H) 02/26/2015   HDL 97 02/26/2015   CHOLHDL 2.1 02/26/2015   VLDL 31 (H) 02/26/2015   LDLCALC 79 02/26/2015   LDLCALC 78 04/16/2013    Current Medications: Current Facility-Administered Medications  Medication Dose Route Frequency Provider Last Rate Last Dose  . acetaminophen (TYLENOL) tablet 650 mg  650 mg Oral Q6H PRN Charm Rings, NP      . alum & mag hydroxide-simeth (MAALOX/MYLANTA) 200-200-20 MG/5ML suspension 30 mL  30 mL Oral Q4H PRN Charm Rings, NP      . baclofen  (LIORESAL) tablet 10 mg  10 mg Oral TID Malvin Johns, MD      . chlordiazePOXIDE (LIBRIUM) capsule 25 mg  25 mg Oral QID Malvin Johns, MD       Followed by  . [START ON 04/09/2018] chlordiazePOXIDE (LIBRIUM) capsule 25 mg  25 mg Oral TID Malvin Johns, MD       Followed by  . [START ON 04/10/2018] chlordiazePOXIDE (LIBRIUM) capsule 25 mg  25 mg Oral Nicki Guadalajara, MD       Followed by  . [START ON 04/12/2018] chlordiazePOXIDE (LIBRIUM) capsule 25 mg  25 mg Oral Daily Malvin Johns, MD      . chlordiazePOXIDE (LIBRIUM) capsule 25 mg  25 mg Oral Q6H PRN Malvin Johns, MD      . Melene Muller ON 04/09/2018] darunavir-cobicistat (PREZCOBIX) 800-150 MG per tablet 1 tablet  1 tablet Oral Daily Antonieta Pert, MD      . Melene Muller ON 04/09/2018] emtricitabine-tenofovir AF (DESCOVY) 200-25 MG per tablet 1 tablet  1 tablet Oral Daily Antonieta Pert, MD      . gabapentin (NEURONTIN) capsule 300 mg  300 mg Oral TID Malvin Johns, MD      . hydrOXYzine (ATARAX/VISTARIL) tablet 25 mg  25 mg Oral Q6H PRN Malvin Johns, MD      . loperamide (IMODIUM) capsule 2-4 mg  2-4 mg Oral PRN Malvin Johns, MD      . magnesium hydroxide (MILK OF MAGNESIA) suspension 30 mL  30 mL Oral Daily PRN Charm Rings, NP      . multivitamin with minerals tablet 1 tablet  1 tablet Oral Daily Malvin Johns, MD      . ondansetron (ZOFRAN-ODT) disintegrating tablet 4 mg  4 mg Oral Q6H PRN Malvin Johns, MD      . topiramate (TOPAMAX) tablet 50 mg  50 mg Oral BID Malvin Johns, MD       PTA Medications: Medications Prior to Admission  Medication Sig Dispense Refill Last Dose  . baclofen (LIORESAL) 10 MG tablet Take 10 mg by mouth 3 (three) times daily.   04/06/2018 at Unknown time  . buPROPion (WELLBUTRIN XL) 150 MG 24 hr tablet Take 1 tablet (150 mg total) by mouth daily. (Patient taking differently: Take 300 mg by mouth daily. ) 30 tablet 0 2 weeks at Unknown time  . darunavir-cobicistat (PREZCOBIX) 800-150 MG tablet Take 1 tablet by mouth  daily. Swallow whole. Do NOT crush, break or chew tablets. Take with food, take with Descovy. 30 tablet 2 04/06/2018 at Unknown time  . emtricitabine-tenofovir AF (DESCOVY) 200-25 MG tablet Take 1 tablet by mouth daily. Take with Prezcobix. 30 tablet 2 04/06/2018 at Unknown time  . gabapentin (NEURONTIN) 100 MG capsule Take 400 mg by mouth 3 (three) times daily.   Past Week at Unknown time  . hydrOXYzine (ATARAX/VISTARIL) 25 MG tablet Take 1 tablet (25 mg total) by mouth every 6 (six) hours as needed (anxiety/agitation or CIWA < or = 10). (Patient taking differently: Take 25-50 mg by mouth every 6 (six) hours as needed (anxiety/agitation or CIWA < or = 10). ) 30 tablet 0 unk    Musculoskeletal: Strength & Muscle Tone: within normal limits Gait & Station: normal Patient leans: N/A  Psychiatric Specialty Exam: Physical Exam  ROS  There were no vitals taken for this visit.There is no height or weight on file to calculate BMI.  General Appearance: Casual  Eye Contact:  Good  Speech:  Clear and Coherent  Volume:  Decreased  Mood:  Depressed  Affect:  Full Range  Thought Process:  Coherent  Orientation:  Full (Time, Place, and Person)  Thought Content:  Logical  Suicidal Thoughts:  No  Homicidal Thoughts:  No  Memory:  Immediate;   Good  Judgement:  Good  Insight:  Good  Psychomotor Activity:  Normal  Concentration:  Concentration: Good  Recall:  Good  Fund of Knowledge:  Good  Language:  Good  Akathisia:  Negative  Handed:  Right  AIMS (if indicated):     Assets:  Communication Skills  ADL's:  Intact  Cognition:  WNL  Sleep:       Treatment Plan Summary: Daily contact with patient to assess and evaluate symptoms and progress in treatment and Medication management  Observation Level/Precautions:  15 minute checks  Laboratory:  UDS  Psychotherapy:  Rehab based  Medications: Continue HIV meds begin detox protocol  Consultations: Not necessary  Discharge Concerns: Long-term  sobriety  Estimated LOS: 5-7  Other: Alcohol dependence and abuse opiate abuse and seeking rule out benzodiazepine abuse Axis II defer Axis III HIV status/chronic pain complaints   Physician Treatment Plan for Primary Diagnosis: <principal problem not specified> Long Term Goal(s): Improvement in symptoms so as ready for discharge  Short Term Goals: Ability to identify triggers associated with substance abuse/mental health issues will improve  Physician Treatment Plan for Secondary Diagnosis: Active Problems:   MDD (major depressive disorder), recurrent severe, without psychosis (HCC)   Major depressive disorder, recurrent severe without psychotic features (HCC)  Long Term Goal(s): Improvement in symptoms so as ready for discharge  Short Term Goals: Ability to maintain clinical measurements within normal limits will improve  I certify that inpatient services furnished can reasonably be expected to improve the patient's condition.    Malvin Johns, MD 3/30/20202:09 PM

## 2018-04-09 DIAGNOSIS — F1310 Sedative, hypnotic or anxiolytic abuse, uncomplicated: Secondary | ICD-10-CM

## 2018-04-09 DIAGNOSIS — F1110 Opioid abuse, uncomplicated: Secondary | ICD-10-CM

## 2018-04-09 DIAGNOSIS — F17210 Nicotine dependence, cigarettes, uncomplicated: Secondary | ICD-10-CM

## 2018-04-09 DIAGNOSIS — F1020 Alcohol dependence, uncomplicated: Secondary | ICD-10-CM

## 2018-04-09 DIAGNOSIS — F1410 Cocaine abuse, uncomplicated: Secondary | ICD-10-CM

## 2018-04-09 MED ORDER — TOPIRAMATE 100 MG PO TABS
100.0000 mg | ORAL_TABLET | Freq: Three times a day (TID) | ORAL | Status: DC
  Administered 2018-04-09 – 2018-04-11 (×6): 100 mg via ORAL
  Filled 2018-04-09 (×11): qty 1

## 2018-04-09 MED ORDER — KETOROLAC TROMETHAMINE 60 MG/2ML IM SOLN
60.0000 mg | Freq: Once | INTRAMUSCULAR | Status: DC
  Filled 2018-04-09 (×2): qty 2

## 2018-04-09 NOTE — Progress Notes (Signed)
D. Pt presents with a sad affect/depressed mood- calm and cooperative behavior.  Pt continues to complain of chronic back pain, but refuses tylenol and toradol. Pt currently denies SI/HI and A/V hallucinations A. Labs and vitals monitored. Pt compliant with medications. Pt supported emotionally and encouraged to express concerns and ask questions.   R. Pt remains safe with 15 minute checks. Will continue POC.

## 2018-04-09 NOTE — Progress Notes (Signed)
[facility] MD Progress Note  04/09/2018 9:22 AM [name]  MRN:  [id number] Subjective:    Patient in bed complaining of some degree of withdrawal symptoms but no thoughts of harming self- Complaining of back pain, vitals are stable, No mania no acute psychosis no evidence of HIV encephalopathy Discussed meds and detox protocol in detail Further apparently the patient has his own housing but has a drug using cousin staying there  Principal Problem: Alcoholism, cocaine abuse, opiate and benzo abuse recently Diagnosis: Active Problems:   MDD (major depressive disorder), recurrent severe, without psychosis (HCC)   Major depressive disorder, recurrent severe without psychotic features (HCC)  Total Time spent with patient: 20 minutes  Past Medical History:  Past Medical History:  Diagnosis Date  . 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 Hemorrhoids   . HIV (human immunodeficiency virus infection) (HCC)   . Stenosis of cervical spine    withb myelopathy    Past Surgical History:  Procedure Laterality Date  . ANTERIOR CERVICAL DECOMP/DISCECTOMY FUSION N/A 07/06/2017   Procedure: ANTERIOR CERVICAL DECOMPRESSION/DISCECTOMY FUSION C3-4/C4-5 2 LEVELS;  Surgeon: Lisbeth Renshaw, MD;  Location: [facility];  Service: Neurosurgery;  Laterality: N/A;  . HERNIA REPAIR  97   lt ing  . RECTAL EXAM UNDER ANESTHESIA N/A 04/24/2014   Procedure:  ligation of bleeding vessels;  Surgeon: Almond Lint, MD;  Location: [facility];  Service: General;  Laterality: N/A;  . TENDON REPAIR     2006-lt index  . WART FULGURATION  07/06/2011   Procedure: FULGURATION ANAL WART;  Surgeon: Shelly Rubenstein, MD;  Location: [facility];  Service: General;  Laterality: N/A;  excision anal condyloma  . WART FULGURATION N/A 04/23/2014   Procedure: EXCISION OF ANAL CONDYLOMA;  Surgeon: Abigail Miyamoto, MD;  Location: [facility];  Service: General;  Laterality: N/A;   Family History:  Family History  Problem Relation Age of Onset   . Cancer Mother        brain  . Cancer Sister        breast    Social History:  Social History   Substance and Sexual Activity  Alcohol Use Yes  . Alcohol/week: 7.0 - 8.0 standard drinks  . Types: 7 - 8 Cans of beer per week   Comment: ''I can drink a case a day''     Social History   Substance and Sexual Activity  Drug Use No    Social History   Socioeconomic History  . Marital status: Single    Spouse name: Not on file  . Number of children: Not on file  . Years of education: Not on file  . Highest education level: Not on file  Occupational History  . Occupation: Unemployed  Social Needs  . Financial resource strain: Not on file  . Food insecurity:    Worry: Not on file    Inability: Not on file  . Transportation needs:    Medical: Not on file    Non-medical: Not on file  Tobacco Use  . Smoking status: Current Every Day Smoker    Packs/day: 0.50    Years: 25.00    Pack years: 12.50    Types: Cigarettes  . Smokeless tobacco: Never Used  Substance and Sexual Activity  . Alcohol use: Yes    Alcohol/week: 7.0 - 8.0 standard drinks    Types: 7 - 8 Cans of beer per week    Comment: ''I can drink a case a day''  . Drug use: No  .  Sexual activity: Not Currently  Lifestyle  . Physical activity:    Days per week: Not on file    Minutes per session: Not on file  . Stress: Not on file  Relationships  . Social connections:    Talks on phone: Not on file    Gets together: Not on file    Attends religious service: Not on file    Active member of club or organization: Not on file    Attends meetings of clubs or organizations: Not on file    Relationship status: Not on file  Other Topics Concern  . Not on file  Social History Narrative   Pt lives alone; receives social security.   Additional Social History:                         Sleep: Fair  Appetite:  Fair  Current Medications: Current Facility-Administered Medications  Medication Dose  Route Frequency Provider Last Rate Last Dose  . acetaminophen (TYLENOL) tablet 650 mg  650 mg Oral Q6H PRN Charm Rings, NP   650 mg at 04/09/18 0536  . alum & mag hydroxide-simeth (MAALOX/MYLANTA) 200-200-20 MG/5ML suspension 30 mL  30 mL Oral Q4H PRN Charm Rings, NP      . baclofen (LIORESAL) tablet 10 mg  10 mg Oral TID Malvin Johns, MD   10 mg at 04/09/18 0902  . chlordiazePOXIDE (LIBRIUM) capsule 25 mg  25 mg Oral TID Malvin Johns, MD   25 mg at 04/09/18 5449   Followed by  . [START ON 04/10/2018] chlordiazePOXIDE (LIBRIUM) capsule 25 mg  25 mg Oral Nicki Guadalajara, MD       Followed by  . [START ON 04/11/2018] chlordiazePOXIDE (LIBRIUM) capsule 25 mg  25 mg Oral Daily Malvin Johns, MD      . chlordiazePOXIDE (LIBRIUM) capsule 25 mg  25 mg Oral Q6H PRN Malvin Johns, MD      . darunavir-cobicistat (PREZCOBIX) 800-150 MG per tablet 1 tablet  1 tablet Oral Daily Antonieta Pert, MD   1 tablet at 04/09/18 0902  . diphenhydrAMINE (BENADRYL) capsule 50 mg  50 mg Oral QHS,MR X 1 Nira Conn A, NP   50 mg at 04/08/18 2114  . emtricitabine-tenofovir AF (DESCOVY) 200-25 MG per tablet 1 tablet  1 tablet Oral Daily Antonieta Pert, MD   1 tablet at 04/09/18 0902  . gabapentin (NEURONTIN) capsule 300 mg  300 mg Oral TID Malvin Johns, MD   300 mg at 04/09/18 2010  . hydrOXYzine (ATARAX/VISTARIL) tablet 25 mg  25 mg Oral Q6H PRN Malvin Johns, MD   25 mg at 04/08/18 2114  . ketorolac (TORADOL) injection 60 mg  60 mg Intramuscular Once Malvin Johns, MD      . loperamide (IMODIUM) capsule 2-4 mg  2-4 mg Oral PRN Malvin Johns, MD      . magnesium hydroxide (MILK OF MAGNESIA) suspension 30 mL  30 mL Oral Daily PRN Charm Rings, NP      . multivitamin with minerals tablet 1 tablet  1 tablet Oral Daily Malvin Johns, MD   1 tablet at 04/09/18 [id number]  . ondansetron (ZOFRAN-ODT) disintegrating tablet 4 mg  4 mg Oral Q6H PRN Malvin Johns, MD      . topiramate (TOPAMAX) tablet 100 mg  100 mg Oral TID  Malvin Johns, MD        Lab Results:  Results for orders placed or performed during the  hospital encounter of 04/07/18 (from the past 48 hour(s))  CBC with Differential     Status: Abnormal   Collection Time: 04/07/18  1:22 PM  Result Value Ref Range   WBC 6.0 4.0 - 10.5 K/uL   RBC 4.92 4.22 - 5.81 MIL/uL   Hemoglobin 11.7 (L) 13.0 - 17.0 g/dL   HCT 16.1 (L) 09.6 - 04.5 %   MCV 78.3 (L) 80.0 - 100.0 fL   MCH 23.8 (L) 26.0 - 34.0 pg   MCHC 30.4 30.0 - 36.0 g/dL   RDW 40.9 (H) 81.1 - 91.4 %   Platelets 444 (H) 150 - 400 K/uL   nRBC 0.0 0.0 - 0.2 %   Neutrophils Relative % 58 %   Neutro Abs 3.5 1.7 - 7.7 K/uL   Lymphocytes Relative 33 %   Lymphs Abs 2.0 0.7 - 4.0 K/uL   Monocytes Relative 7 %   Monocytes Absolute 0.4 0.1 - 1.0 K/uL   Eosinophils Relative 1 %   Eosinophils Absolute 0.1 0.0 - 0.5 K/uL   Basophils Relative 1 %   Basophils Absolute 0.0 0.0 - 0.1 K/uL   Immature Granulocytes 0 %   Abs Immature Granulocytes 0.01 0.00 - 0.07 K/uL    Comment: Performed at [facility], [address] [place], [place] [address]  Comprehensive metabolic panel     Status: Abnormal   Collection Time: 04/07/18  1:22 PM  Result Value Ref Range   Sodium 137 135 - 145 mmol/L   Potassium 3.3 (L) 3.5 - 5.1 mmol/L   Chloride 103 98 - 111 mmol/L   CO2 23 22 - 32 mmol/L   Glucose, Bld 98 70 - 99 mg/dL   BUN 19 6 - 20 mg/dL   Creatinine, Ser 6.21 0.61 - 1.24 mg/dL   Calcium 9.3 8.9 - 30.8 mg/dL   Total Protein 7.8 6.5 - 8.1 g/dL   Albumin 4.4 3.5 - 5.0 g/dL   AST 24 15 - 41 U/L   ALT 18 0 - 44 U/L   Alkaline Phosphatase 46 38 - 126 U/L   Total Bilirubin 0.9 0.3 - 1.2 mg/dL   GFR calc non Af Amer >60 >60 mL/min   GFR calc Af Amer >60 >60 mL/min   Anion gap 11 5 - 15    Comment: Performed at [facility], [address] [place], [place] [address]  Rapid urine drug screen (hospital performed)     Status: Abnormal   Collection Time: 04/07/18  4:08 PM   Result Value Ref Range   Opiates POSITIVE (A) NONE DETECTED   Cocaine NONE DETECTED NONE DETECTED   Benzodiazepines POSITIVE (A) NONE DETECTED   Amphetamines NONE DETECTED NONE DETECTED   Tetrahydrocannabinol NONE DETECTED NONE DETECTED   Barbiturates NONE DETECTED NONE DETECTED    Comment: (NOTE) DRUG SCREEN FOR MEDICAL PURPOSES ONLY.  IF CONFIRMATION IS NEEDED FOR ANY PURPOSE, NOTIFY LAB WITHIN 5 DAYS. LOWEST DETECTABLE LIMITS FOR URINE DRUG SCREEN Drug Class                     Cutoff (ng/mL) Amphetamine and metabolites    1000 Barbiturate and metabolites    200 Benzodiazepine                 200 Tricyclics and metabolites     300 Opiates and metabolites        300 Cocaine and metabolites        300 THC  50 Performed at [facility], [address], [place], [place] [address]   Ethanol     Status: Abnormal   Collection Time: 04/07/18  4:27 PM  Result Value Ref Range   Alcohol, Ethyl (B) 59 (H) <10 mg/dL    Comment: (NOTE) Lowest detectable limit for serum alcohol is 10 mg/dL. For medical purposes only. Performed at [facility], [address], [place], [place] [address]   Salicylate level     Status: None   Collection Time: 04/07/18  4:27 PM  Result Value Ref Range   Salicylate Lvl <7.0 2.8 - 30.0 mg/dL    Comment: Performed at [facility], [address] [place], [place] [address]  Acetaminophen level     Status: Abnormal   Collection Time: 04/07/18  4:27 PM  Result Value Ref Range   Acetaminophen (Tylenol), Serum <10 (L) 10 - 30 ug/mL    Comment: (NOTE) Therapeutic concentrations vary significantly. A range of 10-30 ug/mL  may be an effective concentration for many patients. However, some  are best treated at concentrations outside of this range. Acetaminophen concentrations >150 ug/mL at 4 hours after ingestion  and >50 ug/mL at 12 hours after ingestion are often associated  with  toxic reactions. Performed at [facility], [address], [place], [place] [address]     Blood Alcohol level:  Lab Results  Component Value Date   ETH 59 (H) 04/07/2018   ETH 173 (H) 07/31/2017    Metabolic Disorder Labs: Lab Results  Component Value Date   HGBA1C 6.0 (H) 06/27/2017   MPG 125.5 06/27/2017   No results found for: PROLACTIN Lab Results  Component Value Date   CHOL 207 (H) 02/26/2015   TRIG 154 (H) 02/26/2015   HDL 97 02/26/2015   CHOLHDL 2.1 02/26/2015   VLDL 31 (H) 02/26/2015   LDLCALC 79 02/26/2015   LDLCALC 78 04/16/2013    Physical Findings: AIMS:  , ,  ,  ,    CIWA:    COWS:     Musculoskeletal: Strength & Muscle Tone: within normal limits Gait & Station: normal Patient leans: N/A  Psychiatric Specialty Exam: Physical Exam  ROS  Blood pressure 127/89, pulse 88, temperature 98.7 F (37.1 C), temperature source Oral, resp. rate 18, height 6' (1.829 m), weight 78 kg, SpO2 99 %.Body mass index is 23.33 kg/m.  General Appearance: Casual  Eye Contact:  Good  Speech:  Clear and Coherent  Volume:  Decreased  Mood:  Dysphoric  Affect:  Appropriate and Congruent  Thought Process:  Coherent  Orientation:  Full (Time, Place, and Person)  Thought Content:  Rumination and Tangential  Suicidal Thoughts:  No  Homicidal Thoughts:  No  Memory:  NA Immediate;   Good  Judgement:  Good  Insight:  Good  Psychomotor Activity:  Normal  Concentration:  Concentration: Good  Recall:  Good  Fund of Knowledge:  Good  Language:  Good  Akathisia:  Negative  Handed:  Right  AIMS (if indicated):     Assets:  Physical Health Resilience  ADL's:  Intact  Cognition:  WNL  Sleep:  Number of Hours: 5.25     Treatment Plan Summary: Daily contact with patient to assess and evaluate symptoms and progress in treatment, Medication management and Plan Continue current Topamax but escalate dose continue current detox measures continue  nonaddictive measures for pain complaints continue HIV meds add Toradol Rita Ohara, MD 04/09/2018, 9:22 AM

## 2018-04-09 NOTE — Progress Notes (Signed)
D: Pt denies SI/HI/AVH. Pt is pleasant and cooperative. Pt visible in dayroom this evening. Stated he was doing ok, pt continues to express hip pain. Pt encouraged to explore non-pharmacological and CAM to assist with his pain relief on D/C.   A: Pt was offered support and encouragement. Pt was given scheduled medications. Pt was encourage to attend groups. Q 15 minute checks were done for safety.   R:Pt attends groups and interacts well with peers and staff. Pt is taking medication. Pt receptive to treatment and safety maintained on unit.   Problem: Education: Goal: Emotional status will improve Outcome: Progressing   Problem: Education: Goal: Verbalization of understanding the information provided will improve Outcome: Progressing   Problem: Activity: Goal: Interest or engagement in activities will improve Outcome: Progressing   Problem: Activity: Goal: Sleeping patterns will improve Outcome: Progressing

## 2018-04-09 NOTE — [facility] Counselor (Signed)
Adult Comprehensive Assessment   Patient ID: [name], male   DOB: June 26, 1960, 58 y.o.   MRN: [id number]   Information Source: Information source: Patient   Current Stressors:  Patient states their primary concerns and needs for treatment are:: "Depressed and so down, it's just too much on me and I don't know with all this COVID stuff going on" Patient states their goals for this hospitilization and ongoing recovery are:: "trying to get treatment inpatient, so I can get better and move on" Educational / Learning stressors: graduated high school Employment / Job issues:  on disability Family Relationships: poor-single; no kids; parents deceased--mother and sistered died in car accident last month Financial / Lack of resources (include bankruptcy): disability; medicare, and [employer] / Lack of housing: living in apt in [place] by himself  Physical health (include injuries & life threatening diseases): HIV positive; recent back surgery; walking with a cane. Some ongoing pain issues. Social relationships: poor-few social supports Substance abuse: alcohol-heavy use ongoing for years.  Bereavement / Loss: pt's mother and sister died in car accident last month.    Living/Environment/Situation:  Living Arrangements: Alone Living conditions (as described by patient or guardian): living in apt.  How long has patient lived in current situation?: several years  What is atmosphere in current home: Comfortable   Family History:  Marital status: Single Are you sexually active?: No What is your sexual orientation?: pt declined to comment Has your sexual activity been affected by drugs, alcohol, medication, or emotional stress?: n/a  Does patient have children?: No   Childhood History:  By whom was/is the patient raised?: Mother, Grandparents Additional childhood history information: mother and grandmother were primary caretakers; father in and out in childhood.  Description of  patient's relationship with caregiver when they were a child: close to all caregivers  Patient's description of current relationship with people who raised him/her: close to father-He died of cancer. mother died last month-pt is grieving and drinking heavily How were you disciplined when you got in trouble as a child/adolescent?: n/a  Does patient have siblings?: Yes Number of Siblings: 1 Description of patient's current relationship with siblings: sister-died in car accident last month with his mother.  Did patient suffer any verbal/emotional/physical/sexual abuse as a child?: No Did patient suffer from severe childhood neglect?: No Has patient ever been sexually abused/assaulted/raped as an adolescent or adult?: No Was the patient ever a victim of a crime or a disaster?: No Witnessed domestic violence?: No Has patient been effected by domestic violence as an adult?: No   Education:  Highest grade of school patient has completed: 12 Currently a student?: No Name of school: NA Learning disability?: No   Employment/Work Situation:   Employment situation: On disability Why is patient on disability: HIV How long has patient been on disability: several years Patient's job has been impacted by current illness: No What is the longest time patient has a held a job?: pt declined to say Where was the patient employed at that time?: pt declined to say  Has patient ever been in the [employer]?: No Has patient ever served in combat?: No Did You Receive Any Psychiatric Treatment/Services While in Equities trader?: No Are There Guns or Other Weapons in Your Home?: No Are These Weapons Safely Secured?:  (n/a)   Financial Resources:   Financial resources: Laverda Page, Medicare, Medicaid Does patient have a representative payee or guardian?: No   Alcohol/Substance Abuse:   What has been your use of drugs/alcohol within  the last 12 months?: heavy alcohol abuse-ongoing. use increased over the past  month after neck surgery. "I"m working on cutting down."  If attempted suicide, did drugs/alcohol play a role in this?: No, however pt reports increased thoughts of suicide since decreased mobility and chronic pain after neck surgery.  Alcohol/Substance Abuse Treatment Hx: Past Tx, Inpatient If yes, describe treatment: [facility] 2018; [facility] and [facility] years ago.  Has alcohol/substance abuse ever caused legal problems?: No   Social Support System:   Forensic psychologist System: Poor Describe Community Support System: few friends in community Type of faith/religion: Christian How does patient's faith help to cope with current illness?: prayer    Leisure/Recreation:   Leisure and Hobbies: "nothing"   Strengths/Needs:   What things does the patient do well?: "I don't know." In what areas does patient struggle / problems for patient: coping with grief/loss; alcohol addiction is ongoing; limited insight; limited social supports    Discharge Plan:   Does patient have access to transportation?: Yes, friend or bus Will patient be returning to same living situation after discharge?: yes, "I can go home but I want to go to [facility] or [facility]" Currently receiving community mental health services: MD at [facility] is writing psych medications currently If no, would patient like referral for services when discharged?: Yes (What county[place] [facility] [place]) Does patient have financial barriers related to discharge medications?: No   Summary/Recommendations:   Summary and Recommendations (to be completed by the evaluator): Patient is 58 yo male living in [place], [place] alone. He presents to the hospital seeking treatment for SI, AVH and to get help with his drug and alcohol use as well as feeling like he is "falling apart". Pt has a diagnosis of Alcohol Use Disorder and MDD. Pt currently reports wanting to go to inpatient treatment at [facility] or [facility]. Recommendations for  patient include: crisis stablization, therapeutic milieu, encourage group attendance and participation, medication management for detox/mood stabilization, and development of comprehensive mental wellness/sobriety plan. CSW assessing for appropriate referrals.

## 2018-04-09 NOTE — Progress Notes (Signed)
Patient described his day as having been "okay". He verbalized that he is interested in seeking long-term treatment following discharge and that he has had positive results with [facility] in [place]. His goal for tomorrow is to address his discharge plans. He is complaining of having pain in his spine.

## 2018-04-09 NOTE — Progress Notes (Signed)
D: Pt denies SI/HI/AVH. Pt is pleasant and cooperative. Pt visible on milieu this evening. Pt stated he was feeling a little better.   A: Pt was offered support and encouragement. Pt was given scheduled medications. Pt was encourage to attend groups. Q 15 minute checks were done for safety.   R:Pt attends groups and interacts well with peers and staff. Pt is taking medication. Pt has no complaints.Pt receptive to treatment and safety maintained on unit.   Problem: Education: Goal: Emotional status will improve Outcome: Progressing   Problem: Education: Goal: Mental status will improve Outcome: Progressing

## 2018-04-10 NOTE — [facility] Suicide Risk Assessment (Signed)
[facility] INPATIENT:  Family/Significant Other Suicide Prevention Education  Suicide Prevention Education:  Education Completed; Zen, [place], [phone] has been identified by the patient as the family member/significant other with whom the patient will be residing, and identified as the person(s) who will aid the patient in the event of a mental health crisis (suicidal ideations/suicide attempt).  With written consent from the patient, the family member/significant other has been provided the following suicide prevention education, prior to the and/or following the discharge of the patient.  The suicide prevention education provided includes the following:  Suicide risk factors  Suicide prevention and interventions  National Suicide Hotline telephone number  [facility] assessment telephone number  [facility] Emergency Assistance 911  [facility] and/or Residential Mobile Crisis Unit telephone number  Request made of family/significant other to:  Remove weapons (e.g., guns, rifles, knives), all items previously/currently identified as safety concern.    Remove drugs/medications (over-the-counter, prescriptions, illicit drugs), all items previously/currently identified as a safety concern.  The family member/significant other verbalizes understanding of the suicide prevention education information provided.  The family member/significant other agrees to remove the items of safety concern listed above.  Sister shares that she thinks the patient would benefit from long term (dual diagnosis) residential treatment. She has made some phone calls herself and asked CSW to look into more referrals.   Sister shares that she is concerned about the patient, as he has expressed vague suicidal ideation. Sister is not aware of a plan or a means to attempting suicide, but she feels this is a credible threat.  Enid Cutter, LCSW-A Clinical Social Worker  Darreld Mclean 04/10/2018, 12:45 PM

## 2018-04-10 NOTE — Progress Notes (Signed)
Adult Psychoeducational Group Note  Date:  04/10/2018 Time:  11:34 AM  Group Topic/Focus:  Identifying Needs:   The focus of this group is to help patients identify their personal needs that have been historically problematic and identify healthy behaviors to address their needs.  Participation Level:  Active  Participation Quality:  Appropriate  Affect:  Appropriate  Cognitive:  Alert  Insight: Appropriate  Engagement in Group:  Engaged  Modes of Intervention:  Discussion  Additional Comments:  Pt attended group and participated in discussion.  Azaria Stegman R Kriston Mckinnie 04/10/2018, 11:34 AM

## 2018-04-10 NOTE — Progress Notes (Signed)
D:  Patient's self inventory sheet, patient has fair sleep, no sleep medication.  Good appetite, low energy level, good concentration.  Rated depression, anxiety, hopeless #5.  Withdrawals, tremors, cravings.  Denied SI.  Physical pain, worst pain #10 in past 24 hours.  Pain medication.  Goal is discharge to long term facility.  "Thanks for the input."  No discharge plans. A:  Medications administered per MD orders.  Emotional support and encouragement given patient. R:  Denied SI and HI, contracts for safety.  Denied A/V hallucinations.  Safety maintained with 15 minute checks.

## 2018-04-10 NOTE — Progress Notes (Signed)
D: Pt denies SI/HI/AVH. Pt is pleasant and cooperative. Pt pt been in room majority of the evening.   A: Pt was offered support and encouragement. Pt was encourage to attend groups. Q 15 minute checks were done for safety.   R:  safety maintained on unit.  Problem: Education: Goal: Emotional status will improve Outcome: Progressing   Problem: Education: Goal: Mental status will improve Outcome: Progressing

## 2018-04-10 NOTE — Progress Notes (Signed)
Recreation Therapy Notes  Date:  4.1.20 Time: 0930 Location: [address] Hall Dayroom  Group Topic: Stress Management  Goal Area(s) Addresses:  Patient will identify positive stress management techniques. Patient will identify benefits of using stress management post d/c.  Intervention: Stress Management  Activity :  Meditation.  LRT played a meditation that focused on letting go.  Patients were to listen as the meditation as it played to engage in the activity.  Education:  Stress Management, Discharge Planning.   Education Outcome: Acknowledges Education  Clinical Observations/Feedback:  Pt did not attend group.    Caroll Rancher, LRT/CTRS         Lillia Abed, Lawsyn Heiler A 04/10/2018 10:45 AM

## 2018-04-10 NOTE — Progress Notes (Signed)
Patient referred to 2 additional facilities for residential substance use treatment, both in [place].  Patient was declined by [facility] in [place], [place]. Patient states he wasn't receptive to going there anyway, because he would not be allowed to smoke.  A referral for [facility] in [place], [place]. is currently being reviewed. Intake clinical confirms necessary documentation has been received, CSW waiting for returned call as far as a decision.  Enid Cutter, LCSW-A Clinical Social Worker

## 2018-04-10 NOTE — Plan of Care (Signed)
Nurse discussed anxiety, depression, coping skills with patient. 

## 2018-04-10 NOTE — Progress Notes (Signed)
[facility] MD Progress Note  04/10/2018 9:34 AM Tiara Topper  MRN:  [id number] Subjective:    Patient still focused on pain but not seeking pain meds he is homeless as we have discerned he is seeking rehab would like to stay till Friday which is probably not practical will probably go tomorrow his detox is complete by then he does not have thoughts of harming self or others, he can contract for safety here and if he leaves.  No withdrawal symptoms noted or discerned.  No psychosis  Principal Problem: Abscess dependence/homeless/chronic depression/HIV and chronic pain complaints Diagnosis: Active Problems:   MDD (major depressive disorder), recurrent severe, without psychosis (HCC)   Major depressive disorder, recurrent severe without psychotic features (HCC)  Total Time spent with patient: 20 minutes  Past Medical History:  Past Medical History:  Diagnosis Date  . 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 Hemorrhoids   . HIV (human immunodeficiency virus infection) (HCC)   . Stenosis of cervical spine    withb myelopathy    Past Surgical History:  Procedure Laterality Date  . ANTERIOR CERVICAL DECOMP/DISCECTOMY FUSION N/A 07/06/2017   Procedure: ANTERIOR CERVICAL DECOMPRESSION/DISCECTOMY FUSION C3-4/C4-5 2 LEVELS;  Surgeon: Lisbeth Renshaw, MD;  Location: [facility] OR;  Service: Neurosurgery;  Laterality: N/A;  . HERNIA REPAIR  97   lt ing  . RECTAL EXAM UNDER ANESTHESIA N/A 04/24/2014   Procedure:  ligation of bleeding vessels;  Surgeon: Almond Lint, MD;  Location: [facility] ORS;  Service: General;  Laterality: N/A;  . TENDON REPAIR     2006-lt index  . WART FULGURATION  07/06/2011   Procedure: FULGURATION ANAL WART;  Surgeon: Shelly Rubenstein, MD;  Location: [facility];  Service: General;  Laterality: N/A;  excision anal condyloma  . WART FULGURATION N/A 04/23/2014   Procedure: EXCISION OF ANAL CONDYLOMA;  Surgeon: Abigail Miyamoto, MD;  Location: [facility];  Service: General;  Laterality: N/A;    Family History:  Family History  Problem Relation Age of Onset  . Cancer Mother        brain  . Cancer Sister        breast   Social History:  Social History   Substance and Sexual Activity  Alcohol Use Yes  . Alcohol/week: 7.0 - 8.0 standard drinks  . Types: 7 - 8 Cans of beer per week   Comment: ''I can drink a case a day''     Social History   Substance and Sexual Activity  Drug Use No    Social History   Socioeconomic History  . Marital status: Single    Spouse name: Not on file  . Number of children: Not on file  . Years of education: Not on file  . Highest education level: Not on file  Occupational History  . Occupation: Unemployed  Social Needs  . Financial resource strain: Not on file  . Food insecurity:    Worry: Not on file    Inability: Not on file  . Transportation needs:    Medical: Not on file    Non-medical: Not on file  Tobacco Use  . Smoking status: Current Every Day Smoker    Packs/day: 0.50    Years: 25.00    Pack years: 12.50    Types: Cigarettes  . Smokeless tobacco: Never Used  Substance and Sexual Activity  . Alcohol use: Yes    Alcohol/week: 7.0 - 8.0 standard drinks    Types: 7 - 8 Cans of beer per week  Comment: ''I can drink a case a day''  . Drug use: No  . Sexual activity: Not Currently  Lifestyle  . Physical activity:    Days per week: Not on file    Minutes per session: Not on file  . Stress: Not on file  Relationships  . Social connections:    Talks on phone: Not on file    Gets together: Not on file    Attends religious service: Not on file    Active member of club or organization: Not on file    Attends meetings of clubs or organizations: Not on file    Relationship status: Not on file  Other Topics Concern  . Not on file  Social History Narrative   Pt lives alone; receives social security.   Additional Social History:                         Sleep: Good  Appetite:  Good  Current  Medications: Current Facility-Administered Medications  Medication Dose Route Frequency Provider Last Rate Last Dose  . acetaminophen (TYLENOL) tablet 650 mg  650 mg Oral Q6H PRN Charm Rings, NP   650 mg at 04/10/18 0443  . alum & mag hydroxide-simeth (MAALOX/MYLANTA) 200-200-20 MG/5ML suspension 30 mL  30 mL Oral Q4H PRN Charm Rings, NP      . baclofen (LIORESAL) tablet 10 mg  10 mg Oral TID Malvin Johns, MD   10 mg at 04/10/18 1610  . chlordiazePOXIDE (LIBRIUM) capsule 25 mg  25 mg Oral Nicki Guadalajara, MD   25 mg at 04/10/18 0741   Followed by  . [START ON 04/11/2018] chlordiazePOXIDE (LIBRIUM) capsule 25 mg  25 mg Oral Daily Malvin Johns, MD      . chlordiazePOXIDE (LIBRIUM) capsule 25 mg  25 mg Oral Q6H PRN Malvin Johns, MD   25 mg at 04/09/18 2226  . darunavir-cobicistat (PREZCOBIX) 800-150 MG per tablet 1 tablet  1 tablet Oral Daily Antonieta Pert, MD   1 tablet at 04/10/18 [phone]  . diphenhydrAMINE (BENADRYL) capsule 50 mg  50 mg Oral QHS,MR X 1 Nira Conn A, NP   50 mg at 04/09/18 2226  . emtricitabine-tenofovir AF (DESCOVY) 200-25 MG per tablet 1 tablet  1 tablet Oral Daily Antonieta Pert, MD   1 tablet at 04/10/18 0737  . gabapentin (NEURONTIN) capsule 300 mg  300 mg Oral TID Malvin Johns, MD   300 mg at 04/10/18 0740  . hydrOXYzine (ATARAX/VISTARIL) tablet 25 mg  25 mg Oral Q6H PRN Malvin Johns, MD   25 mg at 04/08/18 2114  . ketorolac (TORADOL) injection 60 mg  60 mg Intramuscular Once Malvin Johns, MD      . loperamide (IMODIUM) capsule 2-4 mg  2-4 mg Oral PRN Malvin Johns, MD      . magnesium hydroxide (MILK OF MAGNESIA) suspension 30 mL  30 mL Oral Daily PRN Charm Rings, NP      . multivitamin with minerals tablet 1 tablet  1 tablet Oral Daily Malvin Johns, MD   1 tablet at 04/10/18 [phone]  . ondansetron (ZOFRAN-ODT) disintegrating tablet 4 mg  4 mg Oral Q6H PRN Malvin Johns, MD      . topiramate (TOPAMAX) tablet 100 mg  100 mg Oral TID Malvin Johns, MD   100  mg at 04/10/18 8119    Lab Results: No results found for this or any previous visit (from the past 48 hour(s)).  Blood Alcohol level:  Lab Results  Component Value Date   ETH 59 (H) 04/07/2018   ETH 173 (H) 07/31/2017    Metabolic Disorder Labs: Lab Results  Component Value Date   HGBA1C 6.0 (H) 06/27/2017   MPG 125.5 06/27/2017   No results found for: PROLACTIN Lab Results  Component Value Date   CHOL 207 (H) 02/26/2015   TRIG 154 (H) 02/26/2015   HDL 97 02/26/2015   CHOLHDL 2.1 02/26/2015   VLDL 31 (H) 02/26/2015   LDLCALC 79 02/26/2015   LDLCALC 78 04/16/2013    Physical Findings: AIMS:  , ,  ,  ,    CIWA:    COWS:     Musculoskeletal: Strength & Muscle Tone: within normal limits Gait & Station: normal Patient leans: N/A  Psychiatric Specialty Exam: Physical Exam  ROS  Blood pressure 123/84, pulse 73, temperature 98.7 F (37.1 C), temperature source Oral, resp. rate 18, height 6' (1.829 m), weight 78 kg, SpO2 99 %.Body mass index is 23.33 kg/m.  General Appearance: Casual  Eye Contact:  Good  Speech:  Clear and Coherent  Volume:  Normal  Mood:  Dysphoric  Affect:  Full Range  Thought Process:  Coherent  Orientation:  Full (Time, Place, and Person)  Thought Content:  Tangential  Suicidal Thoughts:  No  Homicidal Thoughts:  No  Memory:  Immediate;   Good  Judgement:  Good  Insight:  Good  Psychomotor Activity:  Normal  Concentration:  Concentration: Good  Recall:  Good  Fund of Knowledge:  Good  Language:  Good  Akathisia:  Negative  Handed:  Right  AIMS (if indicated):     Assets:  Physical Health Resilience  ADL's:  Intact  Cognition:  WNL  Sleep:  Number of Hours: 4.25     Treatment Plan Summary: Daily contact with patient to assess and evaluate symptoms and progress in treatment, Medication management and Plan Continue detox measures current rehab and group therapies probable discharge tomorrow  Malvin Johns, MD 04/10/2018, 9:34 AM

## 2018-04-10 NOTE — Progress Notes (Signed)
Patient is interested in residential treatment, specifically [facility] in [place] and [address] in [place].   CSW received a call from [facility] last week, [facility] is not able to accept patients coming by [place] bus, [employer] train, or flights. Essentially, a patient would need to drive themselves directly to the facility. This is a barrier for this patient.  CSW called [facility], their intake staff reports they are not accepting referrals at this time in an effort to reduce their census. Patient aware.  Patient declines other referrals at this time. Patient asked about [facility], CSW explained that [facility] doesn't accept [employer] and [facility] is at capacity and not accepting referrals at this time.  Enid Cutter, LCSW-A Clinical Social Worker

## 2018-04-10 NOTE — Tx Team (Signed)
Interdisciplinary Treatment and Diagnostic Plan Update  04/10/2018 Time of Session: 10:00am [name] MRN: [id number]  Principal Diagnosis: <principal problem not specified>  Secondary Diagnoses: Active Problems:   MDD (major depressive disorder), recurrent severe, without psychosis (HCC)   Major depressive disorder, recurrent severe without psychotic features (HCC)   Current Medications:  Current Facility-Administered Medications  Medication Dose Route Frequency Provider Last Rate Last Dose  . acetaminophen (TYLENOL) tablet 650 mg  650 mg Oral Q6H PRN Charm Rings, NP   650 mg at 04/10/18 1202  . alum & mag hydroxide-simeth (MAALOX/MYLANTA) 200-200-20 MG/5ML suspension 30 mL  30 mL Oral Q4H PRN Charm Rings, NP      . baclofen (LIORESAL) tablet 10 mg  10 mg Oral TID Malvin Johns, MD   10 mg at 04/10/18 1202  . chlordiazePOXIDE (LIBRIUM) capsule 25 mg  25 mg Oral Nicki Guadalajara, MD   25 mg at 04/10/18 0741   Followed by  . [START ON 04/11/2018] chlordiazePOXIDE (LIBRIUM) capsule 25 mg  25 mg Oral Daily Malvin Johns, MD      . chlordiazePOXIDE (LIBRIUM) capsule 25 mg  25 mg Oral Q6H PRN Malvin Johns, MD   25 mg at 04/09/18 2226  . darunavir-cobicistat (PREZCOBIX) 800-150 MG per tablet 1 tablet  1 tablet Oral Daily Antonieta Pert, MD   1 tablet at 04/10/18 [phone]  . diphenhydrAMINE (BENADRYL) capsule 50 mg  50 mg Oral QHS,MR X 1 Nira Conn A, NP   50 mg at 04/09/18 2226  . emtricitabine-tenofovir AF (DESCOVY) 200-25 MG per tablet 1 tablet  1 tablet Oral Daily Antonieta Pert, MD   1 tablet at 04/10/18 0737  . gabapentin (NEURONTIN) capsule 300 mg  300 mg Oral TID Malvin Johns, MD   300 mg at 04/10/18 1202  . hydrOXYzine (ATARAX/VISTARIL) tablet 25 mg  25 mg Oral Q6H PRN Malvin Johns, MD   25 mg at 04/08/18 2114  . ketorolac (TORADOL) injection 60 mg  60 mg Intramuscular Once Malvin Johns, MD      . loperamide (IMODIUM) capsule 2-4 mg  2-4 mg Oral PRN Malvin Johns, MD       . magnesium hydroxide (MILK OF MAGNESIA) suspension 30 mL  30 mL Oral Daily PRN Charm Rings, NP      . multivitamin with minerals tablet 1 tablet  1 tablet Oral Daily Malvin Johns, MD   1 tablet at 04/10/18 [phone]  . ondansetron (ZOFRAN-ODT) disintegrating tablet 4 mg  4 mg Oral Q6H PRN Malvin Johns, MD      . topiramate (TOPAMAX) tablet 100 mg  100 mg Oral TID Malvin Johns, MD   100 mg at 04/10/18 1202   PTA Medications: Medications Prior to Admission  Medication Sig Dispense Refill Last Dose  . baclofen (LIORESAL) 10 MG tablet Take 10 mg by mouth 3 (three) times daily.   04/06/2018 at Unknown time  . buPROPion (WELLBUTRIN XL) 150 MG 24 hr tablet Take 1 tablet (150 mg total) by mouth daily. (Patient taking differently: Take 300 mg by mouth daily. ) 30 tablet 0 2 weeks at Unknown time  . darunavir-cobicistat (PREZCOBIX) 800-150 MG tablet Take 1 tablet by mouth daily. Swallow whole. Do NOT crush, break or chew tablets. Take with food, take with Descovy. 30 tablet 2 04/06/2018 at Unknown time  . emtricitabine-tenofovir AF (DESCOVY) 200-25 MG tablet Take 1 tablet by mouth daily. Take with Prezcobix. 30 tablet 2 04/06/2018 at Unknown time  . gabapentin (NEURONTIN)  100 MG capsule Take 400 mg by mouth 3 (three) times daily.   Past Week at Unknown time  . hydrOXYzine (ATARAX/VISTARIL) 25 MG tablet Take 1 tablet (25 mg total) by mouth every 6 (six) hours as needed (anxiety/agitation or CIWA < or = 10). (Patient taking differently: Take 25-50 mg by mouth every 6 (six) hours as needed (anxiety/agitation or CIWA < or = 10). ) 30 tablet 0 unk    Patient Stressors:    Patient Strengths:    Treatment Modalities: Medication Management, Group therapy, Case management,  1 to 1 session with clinician, Psychoeducation, Recreational therapy.   Physician Treatment Plan for Primary Diagnosis: <principal problem not specified> Long Term Goal(s): Improvement in symptoms so as ready for discharge Improvement in  symptoms so as ready for discharge   Short Term Goals: Ability to identify triggers associated with substance abuse/mental health issues will improve Ability to maintain clinical measurements within normal limits will improve  Medication Management: Evaluate patient's response, side effects, and tolerance of medication regimen.  Therapeutic Interventions: 1 to 1 sessions, Unit Group sessions and Medication administration.  Evaluation of Outcomes: Progressing  Physician Treatment Plan for Secondary Diagnosis: Active Problems:   MDD (major depressive disorder), recurrent severe, without psychosis (HCC)   Major depressive disorder, recurrent severe without psychotic features (HCC)  Long Term Goal(s): Improvement in symptoms so as ready for discharge Improvement in symptoms so as ready for discharge   Short Term Goals: Ability to identify triggers associated with substance abuse/mental health issues will improve Ability to maintain clinical measurements within normal limits will improve     Medication Management: Evaluate patient's response, side effects, and tolerance of medication regimen.  Therapeutic Interventions: 1 to 1 sessions, Unit Group sessions and Medication administration.  Evaluation of Outcomes: Progressing   RN Treatment Plan for Primary Diagnosis: <principal problem not specified> Long Term Goal(s): Knowledge of disease and therapeutic regimen to maintain health will improve  Short Term Goals: Ability to participate in decision making will improve, Ability to disclose and discuss suicidal ideas and Ability to identify and develop effective coping behaviors will improve  Medication Management: RN will administer medications as ordered by provider, will assess and evaluate patient's response and provide education to patient for prescribed medication. RN will report any adverse and/or side effects to prescribing provider.  Therapeutic Interventions: 1 on 1 counseling  sessions, Psychoeducation, Medication administration, Evaluate responses to treatment, Monitor vital signs and CBGs as ordered, Perform/monitor CIWA, COWS, AIMS and Fall Risk screenings as ordered, Perform wound care treatments as ordered.  Evaluation of Outcomes: Progressing   LCSW Treatment Plan for Primary Diagnosis: <principal problem not specified> Long Term Goal(s): Safe transition to appropriate next level of care at discharge, Engage patient in therapeutic group addressing interpersonal concerns.  Short Term Goals: Engage patient in aftercare planning with referrals and resources, Increase social support, Identify triggers associated with mental health/substance abuse issues and Increase skills for wellness and recovery  Therapeutic Interventions: Assess for all discharge needs, 1 to 1 time with Social worker, Explore available resources and support systems, Assess for adequacy in community support network, Educate family and significant other(s) on suicide prevention, Complete Psychosocial Assessment, Interpersonal group therapy.  Evaluation of Outcomes: Progressing   Progress in Treatment: Attending groups: Yes. Participating in groups: Yes. Taking medication as prescribed: Yes. Toleration medication: Yes. Family/Significant other contact made: No, will contact:  sister Patient understands diagnosis: Yes. Discussing patient identified problems/goals with staff: Yes. Medical problems stabilized or resolved: Yes. Denies  suicidal/homicidal ideation: No. Issues/concerns per patient self-inventory: Yes.  New problem(s) identified: Yes, Describe:  recent back surgery, has some moblity issues  New Short Term/Long Term Goal(s): detox, medication management for mood stabilization; elimination of SI thoughts; development of comprehensive mental wellness/sobriety plan.  Patient Goals:  Wants to go to residential substance use treatment.  Discharge Plan or Barriers: Patient's  identified residential treatment facilities are not accepting referrals due to COVID 19 pandemic. It is unlikely patient will be able to enter into a residential treatment facility at this time. CSW continuing to assess for appropriate referrals.   Reason for Continuation of Hospitalization: Anxiety Depression Suicidal ideation  Estimated Length of Stay: 3-5 days  Attendees: Patient: [name] 04/10/2018 12:12 PM  Physician:  04/10/2018 12:12 PM  Nursing:  04/10/2018 12:12 PM  RN Care Manager: 04/10/2018 12:12 PM  Social Worker: Enid Cutter, LCSWA 04/10/2018 12:12 PM  Recreational Therapist:  04/10/2018 12:12 PM  Other:  04/10/2018 12:12 PM  Other:  04/10/2018 12:12 PM  Other: 04/10/2018 12:12 PM    Scribe for Treatment Team: Darreld Mclean, LCSWA 04/10/2018 12:12 PM

## 2018-04-11 DIAGNOSIS — Y902 Blood alcohol level of 40-59 mg/100 ml: Secondary | ICD-10-CM

## 2018-04-11 MED ORDER — TOPIRAMATE 100 MG PO TABS: 100.0000 mg | ORAL_TABLET | Freq: Three times a day (TID) | ORAL | 2 refills | Status: DC

## 2018-04-11 MED ORDER — GABAPENTIN 300 MG PO CAPS: 300.0000 mg | ORAL_CAPSULE | Freq: Three times a day (TID) | ORAL | 2 refills | Status: DC

## 2018-04-11 NOTE — Progress Notes (Signed)
CSW following up with referral to [facility] in [place], [place]. Patient was declined due to medical acuity. Patient was declined yesterday by [facility] in [place], [place].  [facility] in [place] is not able to accept the patient and [facility] in [place] is not accepting referrals.   CSW to meet with patient, it is unlikely that he will be able to enter residential substance treatment due to COVID 19. Patient will likely discharge home and CSW will discuss outpatient follow up.  Enid Cutter, LCSW-A Clinical Social Worker

## 2018-04-11 NOTE — Progress Notes (Signed)
Discharge note: Patient reviewed discharge paperwork with RN including prescriptions, follow up appointments, and lab work. Patient given the opportunity to ask questions. All concerns were addressed. All belongings were returned to patient. Denied SI/HI/AVH. Patient thanked staff for their care while at the hospital.  Patient was discharged to lobby. He will take the bus home.

## 2018-04-11 NOTE — Progress Notes (Signed)
  [facility] Adult Case Management Discharge Plan :  Will you be returning to the same living situation after discharge:  Yes,  home. At discharge, do you have transportation home?: Yes,  bus Do you have the ability to pay for your medications: Yes,  Medicare  Release of information consent forms completed and in the chart. Letter on chart.  Patient to Follow up at: Follow-up Information    [facility] Follow up on 04/12/2018.   Why:  Telephonic hospital follow up appointment is Friday, 4/3 at 8:30a.  At this time the appointment will be conducted over the telephone. The provider will contact the patient. Contact information: [address] [place] [address] [phone]           Next level of care provider has access to [facility] Link:no  Safety Planning and Suicide Prevention discussed: Yes,  with sister  Have you used any form of tobacco in the last 30 days? (Cigarettes, Smokeless Tobacco, Cigars, and/or Pipes): Yes  Has patient been referred to the Quitline?: Patient refused referral  Patient has been referred for addiction treatment: Yes  Darreld Mclean, LCSWA 04/11/2018, 10:58 AM

## 2018-04-11 NOTE — [facility] Suicide Risk Assessment (Signed)
[facility] Discharge Suicide Risk Assessment   Principal Problem: Chronic polysubstance abuser Discharge Diagnoses: Active Problems:   MDD (major depressive disorder), recurrent severe, without psychosis (HCC)   Major depressive disorder, recurrent severe without psychotic features (HCC)   Total Time spent with patient: 45 minutes Generally alert oriented cooperative affect appropriate no drug-seeking today no thoughts of harming self or others no cravings tremors or withdrawal stable for discharge understands rehabs are generally close due to viral outbreak  Mental Status Per Nursing Assessment::   On Admission:  NA  Demographic Factors:  Male and Low socioeconomic status  Loss Factors: Decrease in vocational status  Historical Factors: Impulsivity  Risk Reduction Factors:   Sense of responsibility to family and Religious beliefs about death  Continued Clinical Symptoms:  Alcohol/Substance Abuse/Dependencies  Cognitive Features That Contribute To Risk:  None    Suicide Risk:  Minimal: No identifiable suicidal ideation.  Patients presenting with no risk factors but with morbid ruminations; may be classified as minimal risk based on the severity of the depressive symptoms  Follow-up Information    [facility] Follow up.   Contact information: [address] [place] [address] [phone]           Plan Of Care/Follow-up recommendations:  Activity:  full  Mykel Mohl, MD 04/11/2018, 9:20 AM

## 2018-04-11 NOTE — Discharge Summary (Signed)
Physician Discharge Summary Note  Patient:  [name] is an 58 y.o., male MRN:  [id number] DOB:  10/13/1960 Patient phone:  [phone] (home)  Patient address:   [address] [place] [address],  Total Time spent with patient: 30 minutes  Date of Admission:  04/08/2018 Date of Discharge: 04/11/2018  Reason for Admission:  [name] is 58 years of age known to me due to prior healthcare encounters he suffers from chronic HIV infection, chronic alcohol abuse and dependency, recurrent depression he is currently unemployed but states he has his own home. He presented initially through the emergency department stating he has back pain from spinal stenosis but then acknowledged thoughts of overdosing on prescription and over-the-counter medications and he also endorsed drinking at least a case of beer a day.  His last drink was 04/06/2018 his alcohol level was 59 drug screen reflected the use of benzodiazepines and opiates.  Principal Problem: <principal problem not specified> Discharge Diagnoses: Active Problems:   MDD (major depressive disorder), recurrent severe, without psychosis (HCC)   Major depressive disorder, recurrent severe without psychotic features (HCC)   Past Psychiatric History: See H&P  Past Medical History:  Past Medical History:  Diagnosis Date  . 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 Hemorrhoids   . HIV (human immunodeficiency virus infection) (HCC)   . Stenosis of cervical spine    withb myelopathy    Past Surgical History:  Procedure Laterality Date  . ANTERIOR CERVICAL DECOMP/DISCECTOMY FUSION N/A 07/06/2017   Procedure: ANTERIOR CERVICAL DECOMPRESSION/DISCECTOMY FUSION C3-4/C4-5 2 LEVELS;  Surgeon: Lisbeth Renshaw, MD;  Location: [facility];  Service: Neurosurgery;  Laterality: N/A;  . HERNIA REPAIR  97   lt ing  . RECTAL EXAM UNDER ANESTHESIA N/A 04/24/2014   Procedure:  ligation of bleeding vessels;  Surgeon: Almond Lint, MD;  Location: [facility];   Service: General;  Laterality: N/A;  . TENDON REPAIR     2006-lt index  . WART FULGURATION  07/06/2011   Procedure: FULGURATION ANAL WART;  Surgeon: Shelly Rubenstein, MD;  Location: [facility];  Service: General;  Laterality: N/A;  excision anal condyloma  . WART FULGURATION N/A 04/23/2014   Procedure: EXCISION OF ANAL CONDYLOMA;  Surgeon: Abigail Miyamoto, MD;  Location: [facility];  Service: General;  Laterality: N/A;   Family History:  Family History  Problem Relation Age of Onset  . Cancer Mother        brain  . Cancer Sister        breast   Family Psychiatric  History: See H&P Social History:  Social History   Substance and Sexual Activity  Alcohol Use Yes  . Alcohol/week: 7.0 - 8.0 standard drinks  . Types: 7 - 8 Cans of beer per week   Comment: ''I can drink a case a day''     Social History   Substance and Sexual Activity  Drug Use No    Social History   Socioeconomic History  . Marital status: Single    Spouse name: Not on file  . Number of children: Not on file  . Years of education: Not on file  . Highest education level: Not on file  Occupational History  . Occupation: Unemployed  Social Needs  . Financial resource strain: Not on file  . Food insecurity:    Worry: Not on file    Inability: Not on file  . Transportation needs:    Medical: Not on file    Non-medical: Not on  file  Tobacco Use  . Smoking status: Current Every Day Smoker    Packs/day: 0.50    Years: 25.00    Pack years: 12.50    Types: Cigarettes  . Smokeless tobacco: Never Used  Substance and Sexual Activity  . Alcohol use: Yes    Alcohol/week: 7.0 - 8.0 standard drinks    Types: 7 - 8 Cans of beer per week    Comment: ''I can drink a case a day''  . Drug use: No  . Sexual activity: Not Currently  Lifestyle  . Physical activity:    Days per week: Not on file    Minutes per session: Not on file  . Stress: Not on file  Relationships  . Social connections:    Talks  on phone: Not on file    Gets together: Not on file    Attends religious service: Not on file    Active member of club or organization: Not on file    Attends meetings of clubs or organizations: Not on file    Relationship status: Not on file  Other Topics Concern  . Not on file  Social History Narrative   Pt lives alone; receives social security.    Hospital Course:  Pt reported that he came to the hospital due to significant back pain, which he described as getting worse. Pt said that as a result of the pain, he feels suicidal and has a plan to overdose on his prescribed and OTC medications. Pt endorsed the following symptoms: Suicidal ideation with plan; persistent and unremitting despondency; isolation; insomnia; hopelessness and worthlessness; irritability. Pt also endorsed daily consumption of alcohol -- up to a case of beer per day. Last use was 04/06/2018. Pt's UDS was .58. Pt also endorsed auditory hallucination -- ''voices laughing.'' Pt endorsed the belief that people are talking about him (possible paranoid ideation). Pt stated that he does not believe he is properly diagnosed and treated, and if he does not get help, he will kill himself. Pt denied any current psychiatric care, but he stated that he is supposed to take ''benadryl and serotonin (sic).'' Pt reported that he attempted suicide once by hanging in the late 1990s, and he was hospitalized for suicidal ideation in 2019.   After the above admission assessment, patients presenting symptoms were identified. His labs were reviewed and Ethanol was 59 on admission. UDS positive for cocaine and benzodiazepines. Potassium noted on CMP 3.3 otherwise CMP was normal.Detoxification treatments administered as approproiate. He was medicated & discharged on medications as noted below and his home medications for any medical condition was resumed. He was advised to continue medications following discharge and to follow-up with his  healthcare provider for medical conditions. He tolerated his treatment regimen without any adverse effects reported.  During his hospital course, patient  was enrolled & actively  participated in the group counseling sessions. AA/NA meetings were offered & held on this unit and patient actively particpated.   During the course of his hospitalization, patients improvement was monitored by observation and his daily report of symptom reduction. Evidence was further noted by  presentation of good affect and improved mood & behavior. Upon discharge,he denied any SIHI, AVH, delusional thoughts or paranoia. He also denied any substance withdrawal symptoms. His case was presented during treatment team meeting this morning. He was evaluated by MD who determined Toya was both mentally & medically stable to be discharged to continue mental health care on an outpatient basis as noted below. He  was provided with all the necessary information needed to make this appointment without problems. He was provided with a  prescription for his [facility] discharge medications to resume following discharge. Prescriptions was e-precribed through pharmacy noted in chart. He left [facility] with all personal belongings in no apparent distress. Transportation per his arrangement.  To note, patient did desire rehab following discharge.   CSW following up with referral to [facility] in [place], [place]. Patient was declined due to medical acuity. Patient was declined yesterday by [facility] in [place], [place].  [facility] in [place] is not able to accept the patient and [facility] in [place] is not accepting referrals.   CSW advised patient, it was unlikely that he will be able to enter residential substance treatment due to COVID 19. Patient will receive outpatient treatment as noted below.   Physical Findings: AIMS: Facial and Oral Movements Muscles of Facial Expression: None, normal Lips and Perioral Area: None, normal Jaw: None,  normal Tongue: None, normal,Extremity Movements Upper (arms, wrists, hands, fingers): None, normal Lower (legs, knees, ankles, toes): None, normal, Trunk Movements Neck, shoulders, hips: None, normal, Overall Severity Severity of abnormal movements (highest score from questions above): None, normal Incapacitation due to abnormal movements: None, normal Patient's awareness of abnormal movements (rate only patient's report): No Awareness, Dental Status Current problems with teeth and/or dentures?: No Does patient usually wear dentures?: No  CIWA:  CIWA-Ar Total: 1 COWS:  COWS Total Score: 1  Musculoskeletal: Strength & Muscle Tone: within normal limits Gait & Station: normal Patient leans: N/A  Psychiatric Specialty Exam: SEE SRA BY MD  Physical Exam  Nursing note and vitals reviewed. Constitutional: He is oriented to person, place, and time.  Neurological: He is alert and oriented to person, place, and time.    Review of Systems  Psychiatric/Behavioral: Positive for substance abuse. Negative for hallucinations, memory loss and suicidal ideas. Depression: improved. The patient does not have insomnia. Nervous/anxious: improved.   All other systems reviewed and are negative.   Blood pressure (!) 143/91, pulse 80, temperature 98.7 F (37.1 C), temperature source Oral, resp. rate 18, height 6' (1.829 m), weight 78 kg, SpO2 99 %.Body mass index is 23.33 kg/m.    Have you used any form of tobacco in the last 30 days? (Cigarettes, Smokeless Tobacco, Cigars, and/or Pipes): Yes  Has this patient used any form of tobacco in the last 30 days? (Cigarettes, Smokeless Tobacco, Cigars, and/or Pipes) Yes, Yes, A prescription for an FDA-approved tobacco cessation medication was offered at discharge and the patient refused  Blood Alcohol level:  Lab Results  Component Value Date   ETH 59 (H) 04/07/2018   ETH 173 (H) 07/31/2017    Metabolic Disorder Labs:  Lab Results  Component Value Date    HGBA1C 6.0 (H) 06/27/2017   MPG 125.5 06/27/2017   No results found for: PROLACTIN Lab Results  Component Value Date   CHOL 207 (H) 02/26/2015   TRIG 154 (H) 02/26/2015   HDL 97 02/26/2015   CHOLHDL 2.1 02/26/2015   VLDL 31 (H) 02/26/2015   LDLCALC 79 02/26/2015   LDLCALC 78 04/16/2013    See Psychiatric Specialty Exam and Suicide Risk Assessment completed by Attending Physician prior to discharge.  Discharge destination:  Home  Is patient on multiple antipsychotic therapies at discharge:  No   Has Patient had three or more failed trials of antipsychotic monotherapy by history:  No  Recommended Plan for Multiple Antipsychotic Therapies: NA   Allergies as of  04/11/2018      Reactions   Shellfish Allergy Anaphylaxis, Swelling   Swelling everywhere   Sulfa Antibiotics Anaphylaxis, Hives, Itching, Swelling   Swelling all over    Fish Allergy Swelling   Clindamycin Diarrhea      Medication List    TAKE these medications     Indication  baclofen 10 MG tablet Commonly known as:  LIORESAL Take 10 mg by mouth 3 (three) times daily.  Indication:  Muscle Spasticity   buPROPion 150 MG 24 hr tablet Commonly known as:  WELLBUTRIN XL Take 1 tablet (150 mg total) by mouth daily. What changed:  how much to take  Indication:  Major Depressive Disorder   darunavir-cobicistat 800-150 MG tablet Commonly known as:  Prezcobix Take 1 tablet by mouth daily. Swallow whole. Do NOT crush, break or chew tablets. Take with food, take with Descovy.  Indication:  HIV Disease   emtricitabine-tenofovir AF 200-25 MG tablet Commonly known as:  DESCOVY Take 1 tablet by mouth daily. Take with Prezcobix.  Indication:  HIV Disease   gabapentin 300 MG capsule Commonly known as:  NEURONTIN Take 1 capsule (300 mg total) by mouth 3 (three) times daily. What changed:    medication strength  how much to take  Indication:  Neuropathic Pain   hydrOXYzine 25 MG tablet Commonly known as:   ATARAX/VISTARIL Take 1 tablet (25 mg total) by mouth every 6 (six) hours as needed (anxiety/agitation or CIWA < or = 10). What changed:  how much to take  Indication:  Feeling Anxious, Tension   topiramate 100 MG tablet Commonly known as:  TOPAMAX Take 1 tablet (100 mg total) by mouth 3 (three) times daily.  Indication:  Cluster Headache      Follow-up Information    [facility] Follow up on 04/12/2018.   Why:  Telephonic hospital follow up appointment is Friday, 4/3 at 8:30a.  At this time the appointment will be conducted over the telephone. The provider will contact the patient. Contact information: [address] [place] [address] [phone]           Follow-up recommendations:  Follow up with your outpatient provided for any medical issues. Activity & diet as recommended by your primary care provider.  Comments:  Patient is instructed prior to discharge to: Take all medications as prescribed by his/her mental healthcare provider. Report any adverse effects and or reactions from the medicines to his/her outpatient provider promptly. Patient has been instructed & cautioned: To not engage in alcohol and or illegal drug use while on prescription medicines. In the event of worsening symptoms, patient is instructed to call the crisis hotline, 911 and or go to the nearest ED for appropriate evaluation and treatment of symptoms. To follow-up with his/her primary care provider for your other medical issues, concerns and or health care needs.  Signed: Denzil Magnuson, NP 04/11/2018, 1:50 PM

## 2018-04-11 NOTE — Plan of Care (Signed)
°  Problem: Education: °Goal: Emotional status will improve °Outcome: Progressing °Goal: Mental status will improve °Outcome: Progressing °Goal: Verbalization of understanding the information provided will improve °Outcome: Progressing °  °

## 2018-04-16 DIAGNOSIS — R29898 Other symptoms and signs involving the musculoskeletal system: Secondary | ICD-10-CM | POA: Insufficient documentation

## 2018-04-16 DIAGNOSIS — M545 Low back pain, unspecified: Secondary | ICD-10-CM | POA: Insufficient documentation

## 2018-04-18 ENCOUNTER — Encounter (HOSPITAL_COMMUNITY): Payer: Self-pay | Admitting: Emergency Medicine

## 2018-04-18 ENCOUNTER — Emergency Department (HOSPITAL_COMMUNITY)
Admission: EM | Admit: 2018-04-18 | Discharge: 2018-04-20 | Disposition: A | Discharge status: Other Acute Inpatient Hospital | Payer: Medicare Other | Attending: Emergency Medicine | Admitting: Emergency Medicine

## 2018-04-18 ENCOUNTER — Other Ambulatory Visit: Payer: Self-pay

## 2018-04-18 DIAGNOSIS — R45851 Suicidal ideations: Secondary | ICD-10-CM | POA: Diagnosis not present

## 2018-04-18 DIAGNOSIS — Z21 Asymptomatic human immunodeficiency virus [HIV] infection status: Secondary | ICD-10-CM | POA: Insufficient documentation

## 2018-04-18 DIAGNOSIS — F332 Major depressive disorder, recurrent severe without psychotic features: Secondary | ICD-10-CM | POA: Insufficient documentation

## 2018-04-18 DIAGNOSIS — F329 Major depressive disorder, single episode, unspecified: Secondary | ICD-10-CM | POA: Diagnosis present

## 2018-04-18 LAB — CBC WITH DIFFERENTIAL/PLATELET
Abs Immature Granulocytes: 0.03 10*3/uL (ref 0.00–0.07)
Basophils Absolute: 0.1 10*3/uL (ref 0.0–0.1)
Basophils Relative: 1 %
Eosinophils Absolute: 0.3 10*3/uL (ref 0.0–0.5)
Eosinophils Relative: 4 %
HCT: 36.6 % — ABNORMAL LOW (ref 39.0–52.0)
Hemoglobin: 10.8 g/dL — ABNORMAL LOW (ref 13.0–17.0)
Immature Granulocytes: 0 %
Lymphocytes Relative: 36 %
Lymphs Abs: 2.4 10*3/uL (ref 0.7–4.0)
MCH: 22.8 pg — ABNORMAL LOW (ref 26.0–34.0)
MCHC: 29.5 g/dL — ABNORMAL LOW (ref 30.0–36.0)
MCV: 77.4 fL — ABNORMAL LOW (ref 80.0–100.0)
Monocytes Absolute: 0.5 10*3/uL (ref 0.1–1.0)
Monocytes Relative: 8 %
Neutro Abs: 3.4 10*3/uL (ref 1.7–7.7)
Neutrophils Relative %: 51 %
Platelets: 306 10*3/uL (ref 150–400)
RBC: 4.73 MIL/uL (ref 4.22–5.81)
RDW: 20.6 % — ABNORMAL HIGH (ref 11.5–15.5)
WBC: 6.7 10*3/uL (ref 4.0–10.5)
nRBC: 0 % (ref 0.0–0.2)

## 2018-04-18 LAB — COMPREHENSIVE METABOLIC PANEL
ALT: 27 U/L (ref 0–44)
AST: 31 U/L (ref 15–41)
Albumin: 4.2 g/dL (ref 3.5–5.0)
Alkaline Phosphatase: 52 U/L (ref 38–126)
Anion gap: 12 (ref 5–15)
BUN: 11 mg/dL (ref 6–20)
CO2: 19 mmol/L — ABNORMAL LOW (ref 22–32)
Calcium: 9.7 mg/dL (ref 8.9–10.3)
Chloride: 107 mmol/L (ref 98–111)
Creatinine, Ser: 1 mg/dL (ref 0.61–1.24)
GFR calc Af Amer: >60 mL/min (ref >60)
GFR calc non Af Amer: >60 mL/min (ref >60)
Glucose, Bld: 82 mg/dL (ref 70–99)
Potassium: 3.7 mmol/L (ref 3.5–5.1)
Sodium: 138 mmol/L (ref 135–145)
Total Bilirubin: 0.5 mg/dL (ref 0.3–1.2)
Total Protein: 7.1 g/dL (ref 6.5–8.1)

## 2018-04-18 LAB — ACETAMINOPHEN LEVEL: Acetaminophen (Tylenol), Serum: <10 ug/mL — ABNORMAL LOW (ref 10–30)

## 2018-04-18 LAB — ETHANOL: Alcohol, Ethyl (B): 61 mg/dL — ABNORMAL HIGH (ref <10)

## 2018-04-18 LAB — RAPID URINE DRUG SCREEN, HOSP PERFORMED
Amphetamines: NOT DETECTED
Barbiturates: NOT DETECTED
Benzodiazepines: POSITIVE — AB
Cocaine: POSITIVE — AB
Opiates: NOT DETECTED
Tetrahydrocannabinol: NOT DETECTED

## 2018-04-18 LAB — SALICYLATE LEVEL: Salicylate Lvl: <7 mg/dL (ref 2.8–30.0)

## 2018-04-18 MED ORDER — PROMETHAZINE HCL 50 MG RE SUPP [COMPILED RECORD] [AGE PEDIATRIC]
0.10 | RECTAL | Status: DC
Start: ? — End: 2018-04-18

## 2018-04-18 MED ORDER — LORAZEPAM 1 MG PO TABS
0.0000 mg | ORAL_TABLET | Freq: Four times a day (QID) | ORAL | Status: DC
  Administered 2018-04-19: 2 mg via ORAL
  Administered 2018-04-19: 1 mg via ORAL
  Filled 2018-04-18 (×2): qty 2

## 2018-04-18 MED ORDER — VITAMIN B-1 100 MG PO TABS
100.0000 mg | ORAL_TABLET | Freq: Every day | ORAL | Status: DC
  Administered 2018-04-19 – 2018-04-20 (×2): 100 mg via ORAL
  Filled 2018-04-18 (×2): qty 1

## 2018-04-18 MED ORDER — POLYETHYLENE GLYCOL 3350 17 G PO PACK
17.00 | PACK | ORAL | Status: DC
Start: ? — End: 2018-04-18

## 2018-04-18 MED ORDER — PANTOPRAZOLE SODIUM 40 MG PO TBEC
40.00 | DELAYED_RELEASE_TABLET | ORAL | Status: DC
Start: 2018-04-18 — End: 2018-04-18

## 2018-04-18 MED ORDER — NICOTINE 21 MG/24HR TD PT24
21.0000 mg | MEDICATED_PATCH | Freq: Every day | TRANSDERMAL | Status: DC
  Filled 2018-04-18: qty 1

## 2018-04-18 MED ORDER — BUPROPION HCL ER (XL) 150 MG PO TB24
150.00 | ORAL_TABLET | ORAL | Status: DC
Start: 2018-04-18 — End: 2018-04-18

## 2018-04-18 MED ORDER — GENERIC EXTERNAL MEDICATION
Status: DC
Start: ? — End: 2018-04-18

## 2018-04-18 MED ORDER — ONDANSETRON HCL 4 MG PO TABS: 4.0000 mg | ORAL_TABLET | Freq: Three times a day (TID) | ORAL | Status: DC | PRN

## 2018-04-18 MED ORDER — EMTRICITABINE-TENOFOVIR AF 200-25 MG PO TABS
1.00 | ORAL_TABLET | ORAL | Status: DC
Start: 2018-04-18 — End: 2018-04-18

## 2018-04-18 MED ORDER — QUINERVA 260 MG PO TABS
650.00 | ORAL_TABLET | ORAL | Status: DC
Start: ? — End: 2018-04-18

## 2018-04-18 MED ORDER — BACLOFEN 10 MG PO TABS
10.00 | ORAL_TABLET | ORAL | Status: DC
Start: 2018-04-17 — End: 2018-04-18

## 2018-04-18 MED ORDER — ALUM & MAG HYDROXIDE-SIMETH 200-200-20 MG/5ML PO SUSP: 30.0000 mL | Freq: Four times a day (QID) | ORAL | Status: DC | PRN

## 2018-04-18 MED ORDER — ZOLPIDEM TARTRATE 5 MG PO TABS: 5.0000 mg | ORAL_TABLET | Freq: Every evening | ORAL | Status: DC | PRN

## 2018-04-18 MED ORDER — ACETAMINOPHEN 325 MG PO TABS
650.0000 mg | ORAL_TABLET | ORAL | Status: DC | PRN
  Administered 2018-04-19 – 2018-04-20 (×6): 650 mg via ORAL
  Filled 2018-04-18 (×6): qty 2

## 2018-04-18 MED ORDER — LORAZEPAM 2 MG/ML IJ SOLN: 0.0000 mg | Freq: Two times a day (BID) | INTRAMUSCULAR | Status: DC

## 2018-04-18 MED ORDER — ASPIRIN EC 81 MG PO TBEC
81.00 | DELAYED_RELEASE_TABLET | ORAL | Status: DC
Start: 2018-04-18 — End: 2018-04-18

## 2018-04-18 MED ORDER — LORAZEPAM 2 MG/ML IJ SOLN: 0.0000 mg | Freq: Four times a day (QID) | INTRAMUSCULAR | Status: DC

## 2018-04-18 MED ORDER — LORAZEPAM 1 MG PO TABS: 0.0000 mg | ORAL_TABLET | Freq: Two times a day (BID) | ORAL | Status: DC

## 2018-04-18 MED ORDER — THIAMINE HCL 100 MG/ML IJ SOLN: 100.0000 mg | Freq: Every day | INTRAMUSCULAR | Status: DC

## 2018-04-18 MED ORDER — TOPIRAMATE 25 MG PO TABS
100.00 | ORAL_TABLET | ORAL | Status: DC
Start: 2018-04-17 — End: 2018-04-18

## 2018-04-18 MED ORDER — GABAPENTIN 300 MG PO CAPS
300.00 | ORAL_CAPSULE | ORAL | Status: DC
Start: 2018-04-17 — End: 2018-04-18

## 2018-04-18 NOTE — ED Triage Notes (Signed)
Pt c/o suicidal thought states is getting worse, he is alcoholic and this is increasing lately, pt states he lost his house and he is homeless.

## 2018-04-18 NOTE — B[facility] Counselor (Signed)
Pt denies having family friend supports clinician can contact to obtain additional information.    Redmond Pulling, MS, [facility], [facility] Triage Specialist [phone]

## 2018-04-18 NOTE — B[facility] Counselor (Signed)
Clinician called the tele assessment cart however there was not answer. Clinician to check back.    Redmond Pulling, MS, [facility], [facility] Triage Specialist [phone]

## 2018-04-18 NOTE — ED Notes (Signed)
Pt given sandwich, crackers, and drink 

## 2018-04-18 NOTE — BHH Counselor (Signed)
Clinician called the TTS cart again and received the message: "Cannot connect call." Will contact pt's nurse.    Redmond Pulling, MS, [facility], [facility] Triage Specialist [phone]

## 2018-04-18 NOTE — ED Provider Notes (Signed)
[facility] EMERGENCY DEPARTMENT Provider Note   CSN: [id number] Arrival date & time: 04/18/18  2029    History   Chief Complaint Chief Complaint  Patient presents with  . Suicidal    HPI [name] is a 58 y.o. male with history of HIV, cervical stenosis with myelopathy, polysubstance abuse, alcohol abuse, major depression presents for evaluation of worsening suicidal ideations.  He reports that since his recent discharge from behavioral health he has lost his home and has been living on the streets.  He states that "there is no signs living like this.  I wish I was dead".  He states that he recently purchased a gun from a pawn shop.  He reports feeling significantly frustrated regarding his chronic back pains.  He reports his back pain is at baseline.  Denies bowel or bladder incontinence or saddle anesthesia. patient denies  okay all of charge and I because I am to be labile he uses a walker at baseline to ambulate.  He denies homicidal ideation or auditory or visual hallucinations.  He reports drinking approximately a case of beer daily, last alcoholic beverage approximately 30 minutes ago.  Denies any other medical complaints outside of his chronic back pain.       The history is provided by the patient.    Past Medical History:  Diagnosis Date  . 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 Hemorrhoids   . HIV (human immunodeficiency virus infection) (HCC)   . Stenosis of cervical spine    withb myelopathy    Patient Active Problem List   Diagnosis Date Noted  . Healthcare maintenance 02/04/2018  . Alcohol dependence with alcohol-induced mood disorder (HCC)   . Major depressive disorder, recurrent severe without psychotic features (HCC) 08/01/2017  . Dysphagia 07/08/2017  . Stenosis of cervical spine with myelopathy (HCC) 07/06/2017  . Left-sided weakness 06/27/2017  . Tricompartment osteoarthritis of right knee 03/27/2017  . Alcohol abuse with alcohol-induced mood  disorder (HCC) 09/25/2016  . Polysubstance dependence (HCC) 09/25/2016  . Substance use disorder 09/19/2016  . 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 HIV disease (HCC) 11/09/2008    Past Surgical History:  Procedure Laterality Date  . ANTERIOR CERVICAL DECOMP/DISCECTOMY FUSION N/A 07/06/2017   Procedure: ANTERIOR CERVICAL DECOMPRESSION/DISCECTOMY FUSION C3-4/C4-5 2 LEVELS;  Surgeon: Lisbeth Renshaw, MD;  [facility];  Service: Neurosurgery;  Laterality: N/A;  . HERNIA REPAIR  97   lt ing  . RECTAL EXAM UNDER ANESTHESIA N/A 04/24/2014   Procedure:  ligation of bleeding vessels;  Surgeon: Almond Lint, MD;  Location: [facility];  Service: General;  Laterality: N/A;  . TENDON REPAIR     2006-lt index  . WART FULGURATION  07/06/2011   Procedure: FULGURATION ANAL WART;  Surgeon: Shelly Rubenstein, MD;  Location: [facility];  Service: General;  Laterality: N/A;  excision anal condyloma  . WART FULGURATION N/A 04/23/2014   Procedure: EXCISION OF ANAL CONDYLOMA;  Surgeon: Abigail Miyamoto, MD;  Location: [facility];  Service: General;  Laterality: N/A;        Home Medications    Prior to Admission medications   Medication Sig Start Date End Date Taking? Authorizing Provider  baclofen (LIORESAL) 10 MG tablet Take 10 mg by mouth 3 (three) times daily. 02/16/18   [provider]  buPROPion (WELLBUTRIN XL) 150 MG 24 hr tablet Take 1 tablet (150 mg total) by mouth daily. Patient taking differently:  Take 300 mg by mouth daily.  08/04/17   Starkes-Perry, Juel Burrowakia S, FNP  darunavir-cobicistat (PREZCOBIX) 800-150 MG tablet Take 1 tablet by mouth daily. Swallow whole. Do NOT crush, break or chew tablets. Take with food, take with Descovy. 02/04/18   Veryl Speakalone, Gregory D, FNP  emtricitabine-tenofovir AF (DESCOVY) 200-25 MG tablet Take 1 tablet by mouth daily. Take with Prezcobix. 02/04/18   Veryl Speakalone, Gregory D, FNP   gabapentin (NEURONTIN) 300 MG capsule Take 1 capsule (300 mg total) by mouth 3 (three) times daily. 04/11/18   Malvin JohnsFarah, Brian, MD  hydrOXYzine (ATARAX/VISTARIL) 25 MG tablet Take 1 tablet (25 mg total) by mouth every 6 (six) hours as needed (anxiety/agitation or CIWA < or = 10). Patient taking differently: Take 25-50 mg by mouth every 6 (six) hours as needed (anxiety/agitation or CIWA < or = 10).  08/03/17   Starkes-Perry, Juel Burrowakia S, FNP  topiramate (TOPAMAX) 100 MG tablet Take 1 tablet (100 mg total) by mouth 3 (three) times daily. 04/11/18   Malvin JohnsFarah, Brian, MD    Family History Family History  Problem Relation Age of Onset  . Cancer Mother        brain  . Cancer Sister        breast    Social History Social History   Tobacco Use  . Smoking status: Current Every Day Smoker    Packs/day: 0.50    Years: 25.00    Pack years: 12.50    Types: Cigarettes  . Smokeless tobacco: Never Used  Substance Use Topics  . Alcohol use: Yes    Alcohol/week: 7.0 - 8.0 standard drinks    Types: 7 - 8 Cans of beer per week    Comment: ''I can drink a case a day''  . Drug use: No     Allergies   Shellfish allergy; Sulfa antibiotics; Fish allergy; and Clindamycin   Review of Systems Review of Systems  Musculoskeletal: Positive for back pain (Chronic, unchanged).  Psychiatric/Behavioral: Positive for dysphoric mood and suicidal ideas.  All other systems reviewed and are negative.    Physical Exam Updated Vital Signs BP 121/81 (BP Location: Right Arm) Comment: Simultaneous filing. User may not have seen previous data.  Pulse 82   Temp 98.7 F (37.1 C) (Oral)   Resp 18   Ht 6' (1.829 m)   Wt 75.3 kg   SpO2 98%   BMI 22.51 kg/m   Physical Exam Vitals signs and nursing note reviewed.  Constitutional:      General: He is not in acute distress.    Appearance: He is well-developed.  HENT:     Head: Normocephalic and atraumatic.  Eyes:     General:        Right eye: No discharge.         Left eye: No discharge.     Conjunctiva/sclera: Conjunctivae normal.  Neck:     Vascular: No JVD.     Trachea: No tracheal deviation.  Cardiovascular:     Rate and Rhythm: Normal rate and regular rhythm.  Pulmonary:     Effort: Pulmonary effort is normal.     Breath sounds: Normal breath sounds.  Abdominal:     General: Abdomen is flat. There is no distension.     Tenderness: There is no abdominal tenderness. There is no guarding or rebound.  Skin:    General: Skin is warm and dry.     Findings: No erythema.  Neurological:     Mental Status: He is alert.  Comments: Moving extremities spontaneously.  Fluent speech, no facial droop.  Psychiatric:        Mood and Affect: Mood is depressed. Affect is blunt.        Speech: Speech is delayed.        Behavior: Behavior is agitated. Behavior is cooperative.        Thought Content: Thought content includes suicidal ideation. Thought content does not include homicidal ideation. Thought content includes suicidal plan. Thought content does not include homicidal plan.      ED Treatments / Results  Labs (all labs ordered are listed, but only abnormal results are displayed) Labs Reviewed  COMPREHENSIVE METABOLIC PANEL - Abnormal; Notable for the following components:      Result Value   CO2 19 (*)    All other components within normal limits  ETHANOL - Abnormal; Notable for the following components:   Alcohol, Ethyl (B) 61 (*)    All other components within normal limits  RAPID URINE DRUG SCREEN, HOSP PERFORMED - Abnormal; Notable for the following components:   Cocaine POSITIVE (*)    Benzodiazepines POSITIVE (*)    All other components within normal limits  CBC WITH DIFFERENTIAL/PLATELET - Abnormal; Notable for the following components:   Hemoglobin 10.8 (*)    HCT 36.6 (*)    MCV 77.4 (*)    MCH 22.8 (*)    MCHC 29.5 (*)    RDW 20.6 (*)    All other components within normal limits  ACETAMINOPHEN LEVEL - Abnormal; Notable for  the following components:   Acetaminophen (Tylenol), Serum <10 (*)    All other components within normal limits  SALICYLATE LEVEL    EKG None  Radiology No results found.  Procedures Procedures (including critical care time)  Medications Ordered in ED Medications  LORazepam (ATIVAN) injection 0-4 mg (has no administration in time range)    Or  LORazepam (ATIVAN) tablet 0-4 mg (has no administration in time range)  LORazepam (ATIVAN) injection 0-4 mg (has no administration in time range)    Or  LORazepam (ATIVAN) tablet 0-4 mg (has no administration in time range)  thiamine (VITAMIN B-1) tablet 100 mg (has no administration in time range)    Or  thiamine (B-1) injection 100 mg (has no administration in time range)  acetaminophen (TYLENOL) tablet 650 mg (has no administration in time range)  zolpidem (AMBIEN) tablet 5 mg (has no administration in time range)  ondansetron (ZOFRAN) tablet 4 mg (has no administration in time range)  nicotine (NICODERM CQ - dosed in mg/24 hours) patch 21 mg (has no administration in time range)  alum & mag hydroxide-simeth (MAALOX/MYLANTA) 200-200-20 MG/5ML suspension 30 mL (has no administration in time range)     Initial Impression / Assessment and Plan / ED Course  I have reviewed the triage vital signs and the nursing notes.  Pertinent labs & imaging results that were available during my care of the patient were reviewed by me and considered in my medical decision making (see chart for details).        Patient presenting for evaluation of worsening suicidal ideations.  He is afebrile, vital signs are stable.  He is complaining of chronic unchanged back pain but otherwise has no complaints.  Physical examination to that effect is reassuring.  Screening labs reviewed by me show stable anemia, no leukocytosis, no metabolic derangements.  UDS is positive for cocaine and benzodiazepines.  He is medically cleared for TTS evaluation at this time.  TTS recommends inpatient admission.  Of note, patient is here voluntarily and may require IVC if he attempts to leave prior to psychiatric evaluation.  Final Clinical Impressions(s) / ED Diagnoses   Final diagnoses:  Suicidal ideation    ED Discharge Orders    None       Bennye Alm 04/18/18 [id number]    Loren Racer, MD 04/23/18 [id number]

## 2018-04-18 NOTE — BH Assessment (Addendum)
Tele Assessment Note   Patient Name: [name] MRN: [id number] Referring Physician: Michela Pitcher, PA-C. Location of Patient: [facility], [phone]. Location of Provider: Behavioral Health TTS Department  [name] is an 58 y.o. male, who presents voluntary and unaccompanied to [facility]. Per chart pt at [name] in [place] from 04/16/2018-04/17/2018 for SI, depression, and was recommended to follow up with OPT resources. Pt was at [facility] on 04/07/2018 for depression. Clinician asked the pt, "what brought you to the hospital?" Pt reported, "I'm very suicidal and homicidal." Pt reported, "I bought a gun at a pawn shop, I can barely walk, I just don't care anymore." Pt reported, wanting to shoot himself. Pt reported, previous suicide attempts in 2002 for hanging himself and in 1996 where he overdosed on pills. Pt reported, he was diagnosed with spinal stenosis after I had surgery last June." Pt reported, he keeps falling even while using his walker. Pt reported, he wants to hurt them that hurt him. Pt reported, he did not want to disclose who he wanted to hurt or his plan. Pt reported, the following stressor: his health, alcohol/drug use, no supports, homelessness. Pt denies, AVH, and self-injurious behaviors.   Pt reported, he was verbally abused in the past. Pt reported, drinking over a case of beer, daily. Pt reported, using $80.00 worth of cocaine, yesterday. Pt reported, taking a few puffs of marijuana last week. Pt's UDS is pending. Pt denies being linked to OPT resources (medication management and/or counseling.) Pt reported, he is sees his HIV specialist however he does not take his medications as prescribed. Pt chart was at [facility] from 04/08/2018-04/11/2018 due to SI, depression alcohol use.   Pt presents quiet, awake in scrubs with logical, coherent speech. Pt's eye contact was poor. Pt's mood was depressed, helpless, despair. Pt's affect was flat. Pt's thought process was coherent, relevant.  Pt's judgement is partial. Pt was oriented x4. Pt's concentration and insight was fair. Pt's impulse control was poor. Pt reported, if discharged from [facility] he could not contract for safety.   Diagnosis: Major Depressive Disorder, recurrent, severe without psychotic features.                      Alcohol use Disorder, severe.                     Cocaine use Disorder, moderate.                      Cannabis use Disorder, moderate.   Past Medical History:  Past Medical History:  Diagnosis Date  . 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 Hemorrhoids   . HIV (human immunodeficiency virus infection) (HCC)   . Stenosis of cervical spine    withb myelopathy    Past Surgical History:  Procedure Laterality Date  . ANTERIOR CERVICAL DECOMP/DISCECTOMY FUSION N/A 07/06/2017   Procedure: ANTERIOR CERVICAL DECOMPRESSION/DISCECTOMY FUSION C3-4/C4-5 2 LEVELS;  Surgeon: Lisbeth Renshaw, MD;  Location[facility];  Service: Neurosurgery;  Laterality: N/A;  . HERNIA REPAIR  97   lt ing  . RECTAL EXAM UNDER ANESTHESIA N/A 04/24/2014   Procedure:  ligation of bleeding vessels;  Surgeon: Almond Lint, MD;  [facility];  Service: General;  Laterality: N/A;  . TENDON REPAIR     2006-lt index  . WART FULGURATION  07/06/2011   Procedure: FULGURATION ANAL WART;  Surgeon: Shelly Rubenstein, MD;  Location: [facility];  Service: General;  Laterality: N/A;  excision anal condyloma  . WART FULGURATION N/A 04/23/2014   Procedure: EXCISION OF ANAL CONDYLOMA;  Surgeon: Abigail Miyamoto, MD;  [facility];  Service: General;  Laterality: N/A;    Family History:  Family History  Problem Relation Age of Onset  . Cancer Mother        brain  . Cancer Sister        breast    Social History:  reports that he has been smoking cigarettes. He has a 12.50 pack-year smoking history. He has never used smokeless tobacco. He reports current alcohol use of about 7.0 - 8.0 standard drinks of alcohol per week. He reports  that he does not use drugs.  Additional Social History:  Alcohol / Drug Use Pain Medications: See MAR Prescriptions: See MAR Over the Counter: See MAR History of alcohol / drug use?: Yes Negative Consequences of Use: Financial Substance #1 Name of Substance 1: Alcohol 1 - Age of First Use: 27. 1 - Amount (size/oz): Pt reported, over a case daily.  1 - Frequency: Daily.  1 - Duration: Ongoing.  1 - Last Use / Amount: Daily.  Substance #2 Name of Substance 2: Cocaine.  2 - Age of First Use: 27. 2 - Amount (size/oz): Pt reported, using $80.00 yesterday.  2 - Frequency: Per chart, "not often."  2 - Duration: Ongoing.  2 - Last Use / Amount: Yesterday. (04/17/2018)  Substance #3 Name of Substance 3: Marijuana.  3 - Age of First Use: 27. 3 - Amount (size/oz): Pt reported, "taking a few puffs, last week."  3 - Frequency: Pt reported, "every now and then."  3 - Duration: Ongoing.  3 - Last Use / Amount: Last week.   CIWA: CIWA-Ar BP: 121/81(Simultaneous filing. User may not have seen previous data.) Pulse Rate: 82 COWS:    Allergies:  Allergies  Allergen Reactions  . Shellfish Allergy Anaphylaxis and Swelling    Swelling everywhere  . Sulfa Antibiotics Anaphylaxis, Hives, Itching and Swelling    Swelling all over   . Fish Allergy Swelling  . Clindamycin Diarrhea    Home Medications: (Not in a hospital admission)   OB/GYN Status:  No LMP for male patient.  General Assessment Data Assessment unable to be completed: Yes Reason for not completing assessment: Clinician called the tele assessment cart however there was not answer. Clinician to check back.  Location of Assessment: [facility] ED TTS Assessment: In system Is this a Tele or Face-to-Face Assessment?: Tele Assessment Is this an Initial Assessment or a Re-assessment for this encounter?: Initial Assessment Patient Accompanied by:: N/A Language Other than English: No Living Arrangements: Homeless/Shelter Marital status:  Single Living Arrangements: Other (Comment)(Homeless. ) Can pt return to current living arrangement?: Yes Admission Status: Voluntary Is patient capable of signing voluntary admission?: Yes Referral Source: Self/Family/Friend Insurance type: Medicare.      Crisis Care Plan Living Arrangements: Other (Comment)(Homeless. ) Legal Guardian: Other:(Self. ) Name of Psychiatrist: NA Name of Therapist: NA  Education Status Is patient currently in school?: No Is the patient employed, unemployed or receiving disability?: Receiving disability income  Risk to self with the past 6 months Suicidal Ideation: Yes-Currently Present Has patient been a risk to self within the past 6 months prior to admission? : No(Per chart. ) Suicidal Intent: Yes-Currently Present Has patient had any suicidal intent within the past 6 months prior to admission? : Yes Is patient at risk for suicide?: Yes Suicidal Plan?: Yes-Currently Present Has patient had  any suicidal plan within the past 6 months prior to admission? : No(Per chart. ) Specify Current Suicidal Plan: Pt reported, shooting self.  Access to Means: Yes Specify Access to Suicidal Means: Pt reported, he recent purchased a gun from a pawn shop.  What has been your use of drugs/alcohol within the last 12 months?: Alochol, cocaine and marijuana. Previous Attempts/Gestures: Yes How many times?: 2 Other Self Harm Risks: Drug use.  Triggers for Past Attempts: Unknown Intentional Self Injurious Behavior: None(Pt denies. ) Family Suicide History: No Recent stressful life event(s): Other (Comment)(Health, alcohol and drug use, homelessness, no supports. ) Persecutory voices/beliefs?: No Depression: Yes Depression Symptoms: Feeling worthless/self pity, Loss of interest in usual pleasures, Guilt, Fatigue, Isolating, Tearfulness, Insomnia, Despondent Substance abuse history and/or treatment for substance abuse?: Yes Suicide prevention information given to  non-admitted patients: Not applicable  Risk to Others within the past 6 months Homicidal Ideation: Yes-Currently Present Does patient have any lifetime risk of violence toward others beyond the six months prior to admission? : No(Pt denies. ) Thoughts of Harm to Others: Yes-Currently Present Comment - Thoughts of Harm to Others: Pt reported, "to hurt them that hurt me."  Current Homicidal Intent: (Unsure.) Current Homicidal Plan: Yes-Currently Present Describe Current Homicidal Plan: Pt reported, "I don;t want to say."  Access to Homicidal Means: Yes Describe Access to Homicidal Means: Gun.  Identified Victim: Pt would not disclose.  History of harm to others?: No Assessment of Violence: None Noted Violent Behavior Description: NA Does patient have access to weapons?: Yes (Comment)(gun. ) Criminal Charges Pending?: No Does patient have a court date: No Is patient on probation?: No  Psychosis Hallucinations: None noted Delusions: None noted  Mental Status Report Appearance/Hygiene: In scrubs Eye Contact: Poor Motor Activity: Unremarkable Speech: Logical/coherent Level of Consciousness: Quiet/awake Mood: Depressed, Helpless, Despair Affect: Flat Anxiety Level: Minimal Thought Processes: Coherent, Relevant Judgement: Partial Orientation: Person, Place, Time, Situation Obsessive Compulsive Thoughts/Behaviors: Minimal  Cognitive Functioning Concentration: Fair Memory: Recent Intact Is patient IDD: No Insight: Fair Impulse Control: Poor Appetite: Poor Have you had any weight changes? : Loss Amount of the weight change? (lbs): (Pt unsure of amount. ) Sleep: Decreased Total Hours of Sleep: 0 Vegetative Symptoms: None  ADLScreening [facility] Assessment Services) Patient's cognitive ability adequate to safely complete daily activities?: Yes Patient able to express need for assistance with ADLs?: Yes Independently performs ADLs?: Yes (appropriate for developmental age)  Prior  Inpatient Therapy Prior Inpatient Therapy: Yes Prior Therapy Dates: Per chart, 2019 and others.  Prior Therapy Facilty/Provider(s): [facility]. Reason for Treatment: SI, substance use, depression.   Prior Outpatient Therapy Prior Outpatient Therapy: No Does patient have an ACCT team?: No Does patient have Intensive In-House Services?  : No Does patient have [facility] services? : No Does patient have P4CC services?: No  ADL Screening (condition at time of admission) Patient's cognitive ability adequate to safely complete daily activities?: Yes Is the patient deaf or have difficulty hearing?: No Does the patient have difficulty seeing, even when wearing glasses/contacts?: Yes Does the patient have difficulty concentrating, remembering, or making decisions?: No Patient able to express need for assistance with ADLs?: Yes Does the patient have difficulty dressing or bathing?: No Independently performs ADLs?: Yes (appropriate for developmental age) Communication: Independent Dressing (OT): Independent(Pt reported, it takes him a long time to get dressed. ) Grooming: Independent Bathing: Independent Toileting: Independent In/Out Bed: Independent Walks in Home: Independent with device (comment)(Walker. ) Does the patient have difficulty walking or climbing  stairs?: Yes(Pt uses a walker, frequent falls. Diagnosed with Spinal Stenosis last June. ) Weakness of Legs: Both Weakness of Arms/Hands: Both  Home Assistive Devices/Equipment Home Assistive Devices/Equipment: Environmental consultantWalker (specify type)    Abuse/Neglect Assessment (Assessment to be complete while patient is alone) Abuse/Neglect Assessment Can Be Completed: Yes Physical Abuse: Denies(Pt denies. ) Verbal Abuse: Yes, past (Comment)(Pt reported, he was verbally abused in the past. ) Sexual Abuse: Denies(Pt denies. ) Exploitation of patient/patient's resources: Denies(Pt denies. ) Self-Neglect: Denies(Pt denies. )     Advance Directives  (For Healthcare) Does Patient Have a Medical Advance Directive?: No Would patient like information on creating a medical advance directive?: No - Patient declined          Disposition: Donell SievertSpencer Simon, PA recommends inpatient treatment. Per Rutha BouchardJoAnn, AC, RN no appropriate beds available. TTS to seek placement. Disposition discussed with Nehemiah MassedMina, PA-A and Maralyn SagoSarah, RN.     This service was provided via telemedicine using a 2-way, interactive audio and video technology.  Names of all persons participating in this telemedicine service and their role in this encounter. Name: Valentino Nosenthony Mankin.  Role: Patient.   Name: Redmond Pullingreylese D Anavi Branscum, MS, [facility], [facility]. Role: Counselor.          Redmond Pullingreylese D Maggie Dworkin 04/18/2018 10:51 PM    Redmond Pullingreylese D Perry Brucato, MS, [facility], [facility] Triage Specialist [phone]

## 2018-04-18 NOTE — ED Notes (Signed)
Gave pt.a snack,with a drink

## 2018-04-18 NOTE — [facility] Counselor (Signed)
Pt's referral has been sent to the following inpatient treatment facilities:   [facility]. [facility]. [facility]. [facility]  [facility]. [facility].  [facility]. [facility]. [facility]. [facility].  TTS will continue to follow up.   Redmond Pulling, MS, [facility], [facility] Triage Specialist [phone]

## 2018-04-18 NOTE — ED Notes (Signed)
Pt's belongings at nurses station  

## 2018-04-18 NOTE — ED Notes (Signed)
Pt given XL Wine colored scrubs and yellow sock

## 2018-04-19 DIAGNOSIS — F332 Major depressive disorder, recurrent severe without psychotic features: Secondary | ICD-10-CM | POA: Diagnosis not present

## 2018-04-19 MED ORDER — ASPIRIN EC 81 MG PO TBEC
81.0000 mg | DELAYED_RELEASE_TABLET | Freq: Every day | ORAL | Status: DC
  Administered 2018-04-19 – 2018-04-20 (×2): 81 mg via ORAL
  Filled 2018-04-19 (×2): qty 1

## 2018-04-19 MED ORDER — EMTRICITABINE-TENOFOVIR AF 200-25 MG PO TABS
1.0000 | ORAL_TABLET | Freq: Every day | ORAL | Status: DC
  Administered 2018-04-19 – 2018-04-20 (×2): 1 via ORAL
  Filled 2018-04-19 (×3): qty 1

## 2018-04-19 MED ORDER — GABAPENTIN 300 MG PO CAPS
300.0000 mg | ORAL_CAPSULE | Freq: Three times a day (TID) | ORAL | Status: DC
  Administered 2018-04-19 – 2018-04-20 (×3): 300 mg via ORAL
  Filled 2018-04-19 (×3): qty 1

## 2018-04-19 MED ORDER — DARUNAVIR-COBICISTAT 800-150 MG PO TABS
1.0000 | ORAL_TABLET | Freq: Every day | ORAL | Status: DC
  Administered 2018-04-19 – 2018-04-20 (×2): 1 via ORAL
  Filled 2018-04-19 (×2): qty 1

## 2018-04-19 MED ORDER — BACLOFEN 5 MG HALF TABLET
20.0000 mg | ORAL_TABLET | Freq: Three times a day (TID) | ORAL | Status: DC
  Administered 2018-04-19 – 2018-04-20 (×3): 20 mg via ORAL
  Filled 2018-04-19 (×3): qty 1

## 2018-04-19 MED ORDER — PANTOPRAZOLE SODIUM 40 MG PO TBEC
40.0000 mg | DELAYED_RELEASE_TABLET | Freq: Every day | ORAL | Status: DC
  Administered 2018-04-19 – 2018-04-20 (×2): 40 mg via ORAL
  Filled 2018-04-19 (×3): qty 1

## 2018-04-19 MED ORDER — TOPIRAMATE 25 MG PO TABS
100.0000 mg | ORAL_TABLET | Freq: Three times a day (TID) | ORAL | Status: DC
  Administered 2018-04-19 – 2018-04-20 (×3): 100 mg via ORAL
  Filled 2018-04-19 (×3): qty 4

## 2018-04-19 NOTE — BH Assessment (Signed)
Patient continues to meet criteria for inpatient treatment. Re-faxed referrals and updated clinicals to the following hospitals. [address], [facility], [facility], [place], [place], [place], [facility], [facility], and [facility]. [facility] has declined patient due to acuity per previous staffs note. Received a call from [facility] #[phone] indicting that they were taking patients. Faxed referral to facility as well. Fax #[phone]. TTS will continue following up and seek placement.

## 2018-04-19 NOTE — ED Notes (Signed)
Pt asleep and resting comfortably. Environment secured

## 2018-04-19 NOTE — ED Notes (Signed)
Pt's meds taken to pharmacy, valuables placed in locker, and wallet/keys taken to security

## 2018-04-19 NOTE — ED Notes (Signed)
Ordered lunch tray 

## 2018-04-19 NOTE — Progress Notes (Signed)
Pt declined by [facility] due to acuity.  Princess Bruins, MSW, LCSW Therapeutic Triage Specialist  [phone]

## 2018-04-19 NOTE — ED Notes (Signed)
Ordered safety breakfast tray, regular diet

## 2018-04-19 NOTE — ED Notes (Signed)
Dinner Tray Ordered @ 1842. 

## 2018-04-19 NOTE — ED Notes (Signed)
Staffing called regarding sitter case, SI sitter order placed.

## 2018-04-20 ENCOUNTER — Inpatient Hospital Stay (HOSPITAL_COMMUNITY)
Admission: AD | Admit: 2018-04-20 | Discharge: 2018-04-22 | DRG: 897 | Disposition: A | Discharge status: Home / Self Care | Payer: Medicare Other | Source: Intra-hospital | Attending: Psychiatry | Admitting: Psychiatry

## 2018-04-20 ENCOUNTER — Encounter (HOSPITAL_COMMUNITY): Payer: Self-pay

## 2018-04-20 ENCOUNTER — Other Ambulatory Visit: Payer: Self-pay | Admitting: Family

## 2018-04-20 DIAGNOSIS — Z888 Allergy status to other drugs, medicaments and biological substances status: Secondary | ICD-10-CM | POA: Diagnosis not present

## 2018-04-20 DIAGNOSIS — R45850 Homicidal ideations: Secondary | ICD-10-CM | POA: Diagnosis present

## 2018-04-20 DIAGNOSIS — F1410 Cocaine abuse, uncomplicated: Secondary | ICD-10-CM | POA: Diagnosis present

## 2018-04-20 DIAGNOSIS — M4802 Spinal stenosis, cervical region: Secondary | ICD-10-CM | POA: Diagnosis present

## 2018-04-20 DIAGNOSIS — F1024 Alcohol dependence with alcohol-induced mood disorder: Secondary | ICD-10-CM | POA: Diagnosis present

## 2018-04-20 DIAGNOSIS — B20 Human immunodeficiency virus [HIV] disease: Secondary | ICD-10-CM | POA: Diagnosis present

## 2018-04-20 DIAGNOSIS — G479 Sleep disorder, unspecified: Secondary | ICD-10-CM | POA: Diagnosis present

## 2018-04-20 DIAGNOSIS — F339 Major depressive disorder, recurrent, unspecified: Secondary | ICD-10-CM | POA: Diagnosis present

## 2018-04-20 DIAGNOSIS — Z7982 Long term (current) use of aspirin: Secondary | ICD-10-CM

## 2018-04-20 DIAGNOSIS — F1920 Other psychoactive substance dependence, uncomplicated: Secondary | ICD-10-CM | POA: Diagnosis not present

## 2018-04-20 DIAGNOSIS — Z882 Allergy status to sulfonamides status: Secondary | ICD-10-CM | POA: Diagnosis not present

## 2018-04-20 DIAGNOSIS — R45851 Suicidal ideations: Secondary | ICD-10-CM | POA: Diagnosis present

## 2018-04-20 DIAGNOSIS — F332 Major depressive disorder, recurrent severe without psychotic features: Secondary | ICD-10-CM | POA: Diagnosis not present

## 2018-04-20 DIAGNOSIS — F1990 Other psychoactive substance use, unspecified, uncomplicated: Secondary | ICD-10-CM | POA: Diagnosis not present

## 2018-04-20 DIAGNOSIS — Z91013 Allergy to seafood: Secondary | ICD-10-CM

## 2018-04-20 DIAGNOSIS — F17210 Nicotine dependence, cigarettes, uncomplicated: Secondary | ICD-10-CM | POA: Diagnosis present

## 2018-04-20 DIAGNOSIS — Z21 Asymptomatic human immunodeficiency virus [HIV] infection status: Secondary | ICD-10-CM | POA: Diagnosis present

## 2018-04-20 DIAGNOSIS — R531 Weakness: Secondary | ICD-10-CM

## 2018-04-20 DIAGNOSIS — F419 Anxiety disorder, unspecified: Secondary | ICD-10-CM | POA: Diagnosis present

## 2018-04-20 DIAGNOSIS — G8929 Other chronic pain: Secondary | ICD-10-CM | POA: Diagnosis present

## 2018-04-20 DIAGNOSIS — Z590 Homelessness: Secondary | ICD-10-CM | POA: Diagnosis not present

## 2018-04-20 DIAGNOSIS — F602 Antisocial personality disorder: Secondary | ICD-10-CM | POA: Diagnosis present

## 2018-04-20 DIAGNOSIS — Z79899 Other long term (current) drug therapy: Secondary | ICD-10-CM

## 2018-04-20 MED ORDER — MAGNESIUM HYDROXIDE 400 MG/5ML PO SUSP: 30.0000 mL | Freq: Every day | ORAL | Status: DC | PRN

## 2018-04-20 MED ORDER — TRAZODONE HCL 50 MG PO TABS: 50.0000 mg | ORAL_TABLET | Freq: Every evening | ORAL | Status: DC | PRN

## 2018-04-20 MED ORDER — LOPERAMIDE HCL 2 MG PO CAPS: 2.0000 mg | ORAL_CAPSULE | ORAL | Status: DC | PRN

## 2018-04-20 MED ORDER — GABAPENTIN 300 MG PO CAPS
300.0000 mg | ORAL_CAPSULE | Freq: Three times a day (TID) | ORAL | Status: DC
  Administered 2018-04-21 – 2018-04-22 (×5): 300 mg via ORAL
  Filled 2018-04-20 (×10): qty 1

## 2018-04-20 MED ORDER — ACETAMINOPHEN 325 MG PO TABS
650.0000 mg | ORAL_TABLET | Freq: Four times a day (QID) | ORAL | Status: DC | PRN
  Administered 2018-04-20 – 2018-04-21 (×2): 650 mg via ORAL
  Filled 2018-04-20 (×2): qty 2

## 2018-04-20 MED ORDER — LORAZEPAM 1 MG PO TABS: 1.0000 mg | ORAL_TABLET | Freq: Two times a day (BID) | ORAL | Status: DC

## 2018-04-20 MED ORDER — LORAZEPAM 1 MG PO TABS
1.0000 mg | ORAL_TABLET | Freq: Four times a day (QID) | ORAL | Status: DC
  Administered 2018-04-20 – 2018-04-21 (×2): 1 mg via ORAL
  Filled 2018-04-20 (×2): qty 1

## 2018-04-20 MED ORDER — HYDROXYZINE HCL 25 MG PO TABS: 25.0000 mg | ORAL_TABLET | Freq: Three times a day (TID) | ORAL | Status: DC | PRN

## 2018-04-20 MED ORDER — PANTOPRAZOLE SODIUM 20 MG PO TBEC
20.0000 mg | DELAYED_RELEASE_TABLET | Freq: Every day | ORAL | Status: DC
  Administered 2018-04-21 – 2018-04-22 (×2): 20 mg via ORAL
  Filled 2018-04-20 (×4): qty 1

## 2018-04-20 MED ORDER — HYDROXYZINE HCL 25 MG PO TABS: 25.0000 mg | ORAL_TABLET | Freq: Four times a day (QID) | ORAL | Status: DC | PRN

## 2018-04-20 MED ORDER — ALUM & MAG HYDROXIDE-SIMETH 200-200-20 MG/5ML PO SUSP: 30.0000 mL | ORAL | Status: DC | PRN

## 2018-04-20 MED ORDER — VITAMIN B-1 100 MG PO TABS
100.0000 mg | ORAL_TABLET | Freq: Every day | ORAL | Status: DC
  Administered 2018-04-21 – 2018-04-22 (×2): 100 mg via ORAL
  Filled 2018-04-20 (×4): qty 1

## 2018-04-20 MED ORDER — ADULT MULTIVITAMIN W/MINERALS CH
1.0000 | ORAL_TABLET | Freq: Every day | ORAL | Status: DC
  Administered 2018-04-21 – 2018-04-22 (×2): 1 via ORAL
  Filled 2018-04-20 (×4): qty 1

## 2018-04-20 MED ORDER — ONDANSETRON 4 MG PO TBDP: 4.0000 mg | ORAL_TABLET | Freq: Four times a day (QID) | ORAL | Status: DC | PRN

## 2018-04-20 MED ORDER — THIAMINE HCL 100 MG/ML IJ SOLN: 100.0000 mg | Freq: Once | INTRAMUSCULAR | Status: DC

## 2018-04-20 MED ORDER — LORAZEPAM 1 MG PO TABS: 1.0000 mg | ORAL_TABLET | Freq: Every day | ORAL | Status: DC

## 2018-04-20 MED ORDER — LORAZEPAM 1 MG PO TABS: 1.0000 mg | ORAL_TABLET | Freq: Four times a day (QID) | ORAL | Status: DC | PRN

## 2018-04-20 MED ORDER — DARUNAVIR-COBICISTAT 800-150 MG PO TABS
1.0000 | ORAL_TABLET | Freq: Every day | ORAL | Status: DC
  Administered 2018-04-21 – 2018-04-22 (×2): 1 via ORAL
  Filled 2018-04-20 (×4): qty 1

## 2018-04-20 MED ORDER — DIPHENHYDRAMINE HCL 50 MG PO CAPS
100.0000 mg | ORAL_CAPSULE | Freq: Once | ORAL | Status: AC
  Administered 2018-04-20: 23:00:00 100 mg via ORAL
  Filled 2018-04-20: qty 4
  Filled 2018-04-20: qty 2

## 2018-04-20 MED ORDER — TOPIRAMATE 100 MG PO TABS
100.0000 mg | ORAL_TABLET | Freq: Two times a day (BID) | ORAL | Status: DC
  Administered 2018-04-20 – 2018-04-22 (×4): 100 mg via ORAL
  Filled 2018-04-20 (×9): qty 1

## 2018-04-20 MED ORDER — EMTRICITABINE-TENOFOVIR AF 200-25 MG PO TABS
1.0000 | ORAL_TABLET | Freq: Every day | ORAL | Status: DC
  Administered 2018-04-21 – 2018-04-22 (×2): 1 via ORAL
  Filled 2018-04-20 (×4): qty 1

## 2018-04-20 MED ORDER — LORAZEPAM 1 MG PO TABS: 1.0000 mg | ORAL_TABLET | Freq: Three times a day (TID) | ORAL | Status: DC

## 2018-04-20 NOTE — Progress Notes (Signed)
CSW contacted referral agencies with the following results:  Still reviewing: [facility]  Denied: [facility] (does not treat substance use) [place] [facility] (no outside referrals) [facility] (no gero male beds available currently) [facility] (due to medical issues) [facility] (at capacity) Previous denied through [employer] per notes  TTS will continue to seek placement.  Vilma Meckel. Algis Greenhouse, MSW, LCSW Clinical Social Work/Disposition Phone: [phone] Fax: [phone]

## 2018-04-20 NOTE — ED Notes (Signed)
Pt on phone at nurses' desk - called Aris Georgia and his sister.

## 2018-04-20 NOTE — ED Notes (Signed)
Pt refuses to answer most questions - repeatedly states "I feel the same as yesterday".

## 2018-04-20 NOTE — ED Notes (Signed)
Lunch tray ordered 

## 2018-04-20 NOTE — ED Notes (Addendum)
Pt's friend, Zella Ball (?) [phone] - called and requested for pt to return call when he wakes.

## 2018-04-20 NOTE — ED Notes (Signed)
Pt asked for urinal - advised pt he may ambulate to bathroom w/walker. Pt voiced understanding and ambulated to bathroom then to nurses' desk to talk w/his sister - Demon Hotop. Pt signed consent form for information to be released to her - copy faxed to [facility], copy sent to Medical Records, and original on clipboard. Pt then ambulated back to room w/walker w/o difficulty.

## 2018-04-20 NOTE — ED Provider Notes (Signed)
Daily rounding. Please see previous provider for full H&P.  In short,  [name] is a 58 y.o. male with a PMH of HIV, cervical stenosis with meylopathy, polysubstance abuse, alcohol abuse, and major depression presents for suicidal ideations with a plan to use a gun. Patient denies HI or auditory/visual hallucinations. Patient reports alcohol abuse and last drink was on 3 days ago. Patient denies any acute complaints.   Physical Exam  BP 128/76 (BP Location: Right Arm)   Pulse 72   Temp 98.6 F (37 C) (Oral)   Resp 18   Ht 6' (1.829 m)   Wt 75.3 kg   SpO2 100%   BMI 22.51 kg/m   Physical Exam Vitals signs and nursing note reviewed.  Constitutional:      General: He is not in acute distress.    Appearance: He is well-developed.     Comments: Patient is laying comfortably in bed in no acute distress.  HENT:     Head: Normocephalic and atraumatic.  Eyes:     General: No scleral icterus.       Right eye: No discharge.        Left eye: No discharge.     Conjunctiva/sclera: Conjunctivae normal.  Pulmonary:     Effort: Pulmonary effort is normal.  Neurological:     Mental Status: He is alert.     ED Course/Procedures     Procedures  MDM  Patient presents with suicidal ideations with a plan. TTS recommends inpatient admission. Placement is pending.        Carlyle Basques [place], [place] 04/20/18 [id number]    Pricilla Loveless, MD 04/22/18 [phone]

## 2018-04-20 NOTE — Progress Notes (Signed)
Per Nehemiah Settle, [facility], pt has been accepted to [facility] bed 500-1. Accepting provider is Dr. Jeannine Kitten, MD. Patient can arrive by 20:00. Number for report is [phone]. CSW spoke with Kriste Basque, RN regarding disposition.   Vilma Meckel. Algis Greenhouse, MSW, LCSW Clinical Social Work/Disposition Phone: [phone] Fax: [phone]

## 2018-04-20 NOTE — ED Notes (Signed)
Re-TTS being performed.  

## 2018-04-20 NOTE — ED Notes (Signed)
Pt ambulated to bathroom and back to room w/walker. Pt c/o breakfast too cold - refused to eat it as staff advised unable to warm in microwave as it had already been served to him and he had opened it. New tray ordered for pt - aware may be a while before it is delivered.

## 2018-04-20 NOTE — ED Notes (Signed)
Dinner tray ordered.

## 2018-04-20 NOTE — ED Notes (Signed)
Pt given Tylenol as requested for chronic back/hip pain. Advised pt his friend, Aris Georgia, called and wants him to call him back. Also advised pt he has been accepted to [facility] - voiced understanding and agreement w/tx plan. Encouraged pt to call his family member/friend to let them know of tx plan. Voiced understanding.

## 2018-04-20 NOTE — BH Assessment (Signed)
Reassessment The Patient reports current SI with a plan to shot self with a gun that he bought from a pawn shop.  He also reports current HI towards Bernette Mayers, Patient would not provide last name, who subleased a home to him and then took it back and now he is homeless.  Patient reports HI plan is to shot [place] with the gun he purchased from the pqwn shop.  Patient reports AH of a voices telling him to hurt others including shooting Bernette Mayers.  He reports last episode was earlier this morning.  Patient denied VH and paranoia.  Patient reports sleep last night was "terrible/horrible" in terms of difficulty getting and staying asleep due to nightmares.  He reports not having an appetite, however will try and force himself to eat this morning.  Per Reola Calkins, NP;  Patient meets continues to meet inpatient criteria continue seeking placement.

## 2018-04-20 NOTE — ED Notes (Signed)
Voluntary consent form faxed to [facility] and placed in medical records-Monique,RN

## 2018-04-21 ENCOUNTER — Other Ambulatory Visit: Payer: Self-pay

## 2018-04-21 DIAGNOSIS — R45851 Suicidal ideations: Secondary | ICD-10-CM

## 2018-04-21 DIAGNOSIS — F1920 Other psychoactive substance dependence, uncomplicated: Secondary | ICD-10-CM

## 2018-04-21 DIAGNOSIS — F339 Major depressive disorder, recurrent, unspecified: Secondary | ICD-10-CM

## 2018-04-21 DIAGNOSIS — F1024 Alcohol dependence with alcohol-induced mood disorder: Principal | ICD-10-CM

## 2018-04-21 DIAGNOSIS — B20 Human immunodeficiency virus [HIV] disease: Secondary | ICD-10-CM

## 2018-04-21 DIAGNOSIS — R45850 Homicidal ideations: Secondary | ICD-10-CM

## 2018-04-21 DIAGNOSIS — F1990 Other psychoactive substance use, unspecified, uncomplicated: Secondary | ICD-10-CM

## 2018-04-21 DIAGNOSIS — R531 Weakness: Secondary | ICD-10-CM

## 2018-04-21 LAB — LIPID PANEL
Cholesterol: 188 mg/dL (ref 0–200)
HDL: 55 mg/dL (ref >40)
LDL Cholesterol: 102 mg/dL — ABNORMAL HIGH (ref 0–99)
Total CHOL/HDL Ratio: 3.4 RATIO
Triglycerides: 154 mg/dL — ABNORMAL HIGH (ref <150)
VLDL: 31 mg/dL (ref 0–40)

## 2018-04-21 LAB — TSH: TSH: 2.268 u[IU]/mL (ref 0.350–4.500)

## 2018-04-21 MED ORDER — BUPROPION HCL ER (XL) 300 MG PO TB24
300.0000 mg | ORAL_TABLET | Freq: Every day | ORAL | Status: DC
  Administered 2018-04-21 – 2018-04-22 (×2): 300 mg via ORAL
  Filled 2018-04-21 (×4): qty 1

## 2018-04-21 MED ORDER — IBUPROFEN 400 MG PO TABS
400.0000 mg | ORAL_TABLET | Freq: Four times a day (QID) | ORAL | Status: DC | PRN
  Administered 2018-04-21 – 2018-04-22 (×3): 400 mg via ORAL
  Filled 2018-04-21 (×3): qty 1

## 2018-04-21 MED ORDER — DIPHENHYDRAMINE HCL 50 MG PO CAPS
100.0000 mg | ORAL_CAPSULE | Freq: Once | ORAL | Status: AC
  Administered 2018-04-21: 100 mg via ORAL
  Filled 2018-04-21: qty 2
  Filled 2018-04-21: qty 4

## 2018-04-21 NOTE — BHH Suicide Risk Assessment (Signed)
[facility] INPATIENT:  Family/Significant Other Suicide Prevention Education  Suicide Prevention Education:  Patient Refusal for Family/Significant Other Suicide Prevention Education: The patient [name] has refused to provide written consent for family/significant other to be provided Family/Significant Other Suicide Prevention Education during admission and/or prior to discharge.  Physician notified.  OF NOTE, PT REPORTS HAVING PURCHASED A GUN AT A PAWN SHOP AND HIDDEN IT SOMEWHERE THAT HE DECLINES TO SHARE.  Carloyn Jaeger Grossman-Orr 04/21/2018, 5:06 PM

## 2018-04-21 NOTE — BHH Suicide Risk Assessment (Signed)
[facility] Ad[facility] Suicide [facility]   Nursing information obtained from:  Patient Demographic factors:  Male, Unemployed, Low socioeconomic status, Cardell PeachGay, lesbian, or bisexual orientation Current Mental Status:  Suicidal ideation indicated by patient, Thoughts of violence towards others Loss Factors:  Decline in physical health, Financial problems / change in socioeconomic status Historical Factors:  Prior suicide attempts Risk Reduction Factors:  Positive social support  Total Time spent with patient: 20 minutes Principal Problem: <principal problem not specified> Diagnosis:  Active Problems:   MDD (major depressive disorder), recurrent episode (HCC)  Subjective Data: Patient is seen and examined.  Patient is a 58 year old male with a past medical history significant for HIV, and a past psychiatric history significant for alcohol dependence, cocaine dependence, antisocial personality disorder and chronic pain who [facility] on 04/18/2018 with suicidal and homicidal ideation.  The patient had been seen on 04/07/2018 and was admitted to the psychiatric unit at that time for similar circumstances.  He was discharged on 04/11/2018.  At that time he was seeking a residential treatment program.  Unfortunately secondary to the coronavirus issues there were no available shelters nor rehabilitation programs for him.  He was discharged on baclofen, bupropion, gabapentin, hydroxyzine, topiramate and his HIV medications.  He presented again on 04/16/2018 at [facility] with suicidal ideation as well.  Psychiatry was consulted, and he was started on alcohol withdrawal protocol.  [facility], and he was discharged.  He then again presented [facility] on 04/18/2018 again with suicidal and homicidal ideation.  They recommended admission at that time.  He was then admitted to our facility on 04/21/2018.  He is  irritable and entitled.  He wants things that are not available for him in the community.  He stated "I am suicidal and homicidal".  He was admitted to the hospital for evaluation and stabilization.  Continued Clinical Symptoms:  Alcohol Use Disorder Identification Test Final Score (AUDIT): 29 The "Alcohol Use Disorders Identification Test", Guidelines for Use in Primary Care, Second Edition.  World Science writerHealth Organization [facility](WHO). Score between 0-7:  no or low risk or alcohol related problems. Score between 8-15:  moderate risk of alcohol related problems. Score between 16-19:  high risk of alcohol related problems. Score 20 or above:  warrants further diagnostic evaluation for alcohol dependence and treatment.   CLINICAL FACTORS:   Depression:   Comorbid alcohol abuse/dependence Impulsivity Alcohol/Substance Abuse/Dependencies Personality Disorders:   Cluster C Comorbid alcohol abuse/dependence Chronic Pain More than one psychiatric diagnosis Unstable or Poor Therapeutic Relationship Previous Psychiatric Diagnoses and Treatments Medical Diagnoses and Treatments/Surgeries   Musculoskeletal: Strength & Muscle Tone: decreased Gait & Station: broad based Patient leans: N/A  Psychiatric Specialty Exam: Physical Exam  Nursing note and vitals reviewed. Constitutional: He is oriented to person, place, and time. He appears well-developed and well-nourished.  HENT:  Head: Normocephalic and atraumatic.  Respiratory: Effort normal.  Neurological: He is alert and oriented to person, place, and time.    ROS  Blood pressure (!) 150/97, pulse 84, temperature 99 F (37.2 C), temperature source Oral, resp. rate 18, height 6' (1.829 m), weight 75.3 kg.Body mass index is 22.51 kg/m.  General Appearance: Casual  Eye Contact:  Fair  Speech:  Normal Rate  Volume:  Increased  Mood:  Irritable  Affect:  Congruent  Thought Process:  Coherent and Descriptions of Associations: Intact  Orientation:   Full (Time, Place, and  Person)  Thought Content:  Logical  Suicidal Thoughts:  Yes.  without intent/plan  Homicidal Thoughts:  No  Memory:  Immediate;   Poor Recent;   Poor Remote;   Poor  Judgement:  Impaired  Insight:  Lacking  Psychomotor Activity:  Increased  Concentration:  Concentration: Fair and Attention Span: Fair  Recall:  [employer] of Knowledge:  Fair  Language:  Good  Akathisia:  Negative  Handed:  Right  AIMS (if indicated):     Assets:  Desire for Improvement Resilience  ADL's:  Intact  Cognition:  WNL  Sleep:  Number of Hours: 5      COGNITIVE FEATURES THAT CONTRIBUTE TO RISK:  None    SUICIDE RISK:   Minimal: No identifiable suicidal ideation.  Patients presenting with no risk factors but with morbid ruminations; may be classified as minimal risk based on the severity of the depressive symptoms  PLAN OF CARE: Patient is seen and examined.  Patient is a 58 year old male with a past psychiatric history significant for polysubstance dependence issues specifically alcohol dependence and cocaine dependence as well as a past medical history significant for HIV, cervical stenosis.  I also believe that there is a large component of secondary gain here.  He will be admitted to the hospital.  He will be integrated into the milieu.  His medications will be continued including the Wellbutrin XL 300 mg p.o. daily.  He will be placed on lorazepam 1 mg p.o. every 6 hours as needed CIWA greater than 10.  He underwent detox on his last hospitalization, and the electronic medical record reveals that he also underwent a detox protocol on 04/16/2018 at [facility].  He also has been sitting in the emergency department since 04/18/2018.  His drug screen was positive for benzodiazepines as well as cocaine in the emergency department.  His blood alcohol was 61.  He is mildly anemic, and his MCV, MCH and MCHC are all slightly low.  He will be on folic acid and thiamine for that.  His HIV  medications will be continued.  He will also be put on gabapentin 300 mg p.o. 3 times daily for his chronic pain issues.  Again I think this hospitalization has a large degree of secondary gain, and I believe the patient is seeking housing.  I certify that inpatient services furnished can reasonably be expected to improve the patient's condition.   Antonieta Pert, MD 04/21/2018, 8:30 AM

## 2018-04-21 NOTE — BHH Counselor (Signed)
Adult Comprehensive Assessment  Patient ID: [name], male   DOB: 11-02-1960, 58 y.o.   MRN: [id number]  Information Source: Information source: Patient  Current Stressors:  Patient states their primary concerns and needs for treatment are:: "Housing and everything else" Patient states their goals for this hospitilization and ongoing recovery are:: "I don't know.  I don't want to live." Educational / Learning stressors: None Employment / Job issues: On disability Family Relationships: No family  Surveyor, quantity / Lack of resources (include bankruptcy): Stressful - not enough income Housing / Lack of housing: Very stressful, has lived in the same place for 3-1/2 years and is being evicted by landlord Physical health (include injuries & life threatening diseases): Very stressful.  Had surgery on his neck in June 2019, and they found out he has stenosis in his spine.  Is in a lot of pain.  Has HIV+ and takes his medication irregularly. Social relationships: States he has none. Substance abuse: Severe problem, states he is a "terrible alcoholic" and has started using "powder." Bereavement / Loss: Significant loss issues - mother and sister died in a car accident in May 05, 2017  Living/Environment/Situation:  Living Arrangements: Alone Living conditions (as described by patient or guardian): Fair Who else lives in the home?: Alone How long has patient lived in current situation?: 3-1/2 years What is atmosphere in current home: Other (Comment)(State his landlord [name] is Engineer, agricultural him and he has never previously been homicidal, but is now toward this person.)  Family History:  Marital status: Single Are you sexually active?: No What is your sexual orientation?: pt declined to comment Has your sexual activity been affected by drugs, alcohol, medication, or emotional stress?: n/a  Does patient have children?: No  Childhood History:  Additional childhood history information: mother and grandmother  were primary caretakers; father in and out in childhood.  Description of patient's relationship with caregiver when they were a child: close to all caregivers  Patient's description of current relationship with people who raised him/her: Mother deceased, car accident last year How were you disciplined when you got in trouble as a child/adolescent?: n/a  Does patient have siblings?: Yes Number of Siblings: 1 Description of patient's current relationship with siblings: sister-died in car accident last year with his mother.  Did patient suffer any verbal/emotional/physical/sexual abuse as a child?: No Did patient suffer from severe childhood neglect?: No Has patient ever been sexually abused/assaulted/raped as an adolescent or adult?: No Was the patient ever a victim of a crime or a disaster?: No Witnessed domestic violence?: No Has patient been effected by domestic violence as an adult?: No  Education:  Highest grade of school patient has completed: High school graduate Currently a student?: No Learning disability?: No  Employment/Work Situation:   Employment situation: On disability Why is patient on disability: HIV How long has patient been on disability: several years Patient's job has been impacted by current illness: No What is the longest time patient has a held a job?: pt declined to say Where was the patient employed at that time?: pt declined to say  Did You Receive Any Psychiatric Treatment/Services While in the [employer]?: (No Financial planner) Are There Guns or Other Weapons in Your Home?: Yes Types of Guns/Weapons: Declines to say Are These Weapons Safely Secured?: No Who Could Verify You Are Able To Have These Secured:: Only himself - he has hidden the gun he purchased in a pawn shop in some location, refuses to share.  Financial Resources:   Financial resources:  Medicare, Receives SSDI Does patient have a representative payee or guardian?: No  Alcohol/Substance Abuse:    What has been your use of drugs/alcohol within the last 12 months?: A case of beer daily, $80 cocaine recently, puffed marijuana last week If attempted suicide, did drugs/alcohol play a role in this?: No Alcohol/Substance Abuse Treatment Hx: Past Tx, Inpatient, Past Tx, Outpatient, Past detox If yes, describe treatment: [facility] recently and in the past; [facility] Has alcohol/substance abuse ever caused legal problems?: No  Social Support System:   Forensic psychologistatient's Community Support System: None Describe Community Support System: N/A Type of faith/religion: Ephriam KnucklesChristian How does patient's faith help to cope with current illness?: prayer  Leisure/Recreation:   Leisure and Hobbies: "nothing"  Strengths/Needs:   What is the patient's perception of their strengths?: Does not answer at this time Patient states they can use these personal strengths during their treatment to contribute to their recovery: N/A Patient states these barriers may affect/interfere with their treatment: N/A Patient states these barriers may affect their return to the community: Cannot return to his home. Other important information patient would like considered in planning for their treatment: Wants to go to long-term rehab.  Was recently hospitalized and turned down to every rehab due to his medical complexity and the current world pandemic.  Discharge Plan:   Currently receiving community mental health services: No Patient states concerns and preferences for aftercare planning are: Wants to go to long-term rehab and does not understand that it is his medical complexity preventing this.  Feels he is not being helped otherwise. Patient states they will know when they are safe and ready for discharge when: Does not want to discharge, states he wants to kill "[name] MayersSheldon" and die himself. Does patient have access to transportation?: No Does patient have financial barriers related to discharge  medications?: No Patient description of barriers related to discharge medications: Has disability income and insurance Plan for no access to transportation at discharge: To be assessed Plan for living situation after discharge: Wants to go to rehab, has no ideas for where else to go. Will patient be returning to same living situation after discharge?: No  Summary/Recommendations:   Summary and Recommendations (to be completed by the evaluator): Patient is a 58yo male admitted with SI and HI, saying he bought a gun at a pawn shop and has it hidden, will not say where it is or who he is homicidal toward other than his landlord named Comptrollerheldon.  Primary stressors include homelessness, severe pain, severe substance abuse, HIV+ and noncompliance with his medicines, lack of supports, and mental health issues.  [facility] [place] [facility]/17/2018, and [facility] that [facility]11/2018.  Patient will benefit from crisis stabilization, medication evaluation, group therapy and psychoeducation, in addition to case management for discharge planning. At discharge it is recommended that Patient adhere to the established discharge plan and continue in treatment.  Lynnell ChadMareida J Grossman-Orr. 04/21/2018

## 2018-04-21 NOTE — H&P (Signed)
Psychiatric Admission Assessment Adult  Patient Identification: [name] MRN:  [id number] Date of Evaluation:  04/21/2018 Chief Complaint:  MDD Principal Diagnosis: <principal problem not specified> Diagnosis:  Active Problems:   MDD (major depressive disorder), recurrent episode (HCC)  History of Present Illness: Patient is seen and examined.  Patient is a 58 year old male with a past medical history significant for HIV, and a past psychiatric history significant for alcohol dependence, cocaine dependence, antisocial personality disorder and chronic pain who presented to the [facility] emergency department on 04/18/2018 with suicidal and homicidal ideation.  The patient had been seen on 04/07/2018 and was admitted to the psychiatric unit at that time for similar circumstances.  He was discharged on 04/11/2018.  At that time he was seeking a residential treatment program.  Unfortunately secondary to the coronavirus issues there were no available shelters nor rehabilitation programs for him.  He was discharged on baclofen, bupropion, gabapentin, hydroxyzine, topiramate and his HIV medications.  He presented again on 04/16/2018 at [facility] with suicidal ideation as well.  Psychiatry was consulted, and he was started on alcohol withdrawal protocol.  He was referred specifically to a facility named [facility], and he was discharged.  He then again presented to the [facility] emergency department on 04/18/2018 again with suicidal and homicidal ideation.  They recommended admission at that time.  He was then admitted to our facility on 04/21/2018.  He is irritable and entitled.  He wants things that are not available for him in the community.  He stated "I am suicidal and homicidal".  He was admitted to the hospital for evaluation and stabilization.  Associated Signs/Symptoms: Depression Symptoms:  depressed mood, anhedonia, fatigue, feelings of worthlessness/guilt, difficulty  concentrating, hopelessness, suicidal thoughts without plan, anxiety, loss of energy/fatigue, disturbed sleep, (Hypo) Manic Symptoms:  Impulsivity, Irritable Mood, Anxiety Symptoms:  Excessive Worry, Psychotic Symptoms:  denied PTSD Symptoms: Negative Total Time spent with patient: 15 minutes  Past Psychiatric History: Patient has had multiple psychiatric admissions primarily with suicidal ideation.  His last admission here was 04/08/2018.  He then presented with suicidal ideation to [facility] in [place] on 04/16/2018.  He also presented to the [facility] emergency department with suicidal ideation on 04/18/2018.  Is the patient at risk to self? Yes.    Has the patient been a risk to self in the past 6 months? Yes.    Has the patient been a risk to self within the distant past? Yes.    Is the patient a risk to others? No.  Has the patient been a risk to others in the past 6 months? No.  Has the patient been a risk to others within the distant past? No.   Prior Inpatient Therapy:   Prior Outpatient Therapy:    Alcohol Screening: 1. How often do you have a drink containing alcohol?: 4 or more times a week 2. How many drinks containing alcohol do you have on a typical day when you are drinking?: 7, 8, or 9 3. How often do you have six or more drinks on one occasion?: Daily or almost daily AUDIT-C Score: 11 4. How often during the last year have you found that you were not able to stop drinking once you had started?: Monthly 5. How often during the last year have you failed to do what was normally expected from you becasue of drinking?: Weekly 6. How often during the last year have you needed a first drink in the morning to get  yourself going after a heavy drinking session?: Weekly 7. How often during the last year have you had a feeling of guilt of remorse after drinking?: Weekly 8. How often during the last year have you been unable to remember what happened the night before because you  had been drinking?: Weekly 9. Have you or someone else been injured as a result of your drinking?: No 10. Has a relative or friend or a doctor or another health worker been concerned about your drinking or suggested you cut down?: Yes, during the last year Alcohol Use Disorder Identification Test Final Score (AUDIT): 29 Alcohol Brief Interventions/Follow-up: Brief Advice Substance Abuse History in the last 12 months:  Yes.   Consequences of Substance Abuse: Negative Previous Psychotropic Medications: Yes  Psychological Evaluations: Yes  Past Medical History:  Past Medical History:  Diagnosis Date  . 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 Hemorrhoids   . HIV (human immunodeficiency virus infection) (HCC)   . Stenosis of cervical spine    withb myelopathy    Past Surgical History:  Procedure Laterality Date  . ANTERIOR CERVICAL DECOMP/DISCECTOMY FUSION N/A 07/06/2017   Procedure: ANTERIOR CERVICAL DECOMPRESSION/DISCECTOMY FUSION C3-4/C4-5 2 LEVELS;  Surgeon: Lisbeth Renshaw, MD;  Location: [facility] OR;  Service: Neurosurgery;  Laterality: N/A;  . HERNIA REPAIR  97   lt ing  . RECTAL EXAM UNDER ANESTHESIA N/A 04/24/2014   Procedure:  ligation of bleeding vessels;  Surgeon: Almond Lint, MD;  Location: [facility] ORS;  Service: General;  Laterality: N/A;  . TENDON REPAIR     2006-lt index  . WART FULGURATION  07/06/2011   Procedure: FULGURATION ANAL WART;  Surgeon: Shelly Rubenstein, MD;  Location: [facility];  Service: General;  Laterality: N/A;  excision anal condyloma  . WART FULGURATION N/A 04/23/2014   Procedure: EXCISION OF ANAL CONDYLOMA;  Surgeon: Abigail Miyamoto, MD;  Location: [facility] ORS;  Service: General;  Laterality: N/A;   Family History:  Family History  Problem Relation Age of Onset  . Cancer Mother        brain  . Cancer Sister        breast   Family Psychiatric  History: Uncontributory Tobacco Screening: Have you used any form of tobacco in the last 30 days? (Cigarettes,  Smokeless Tobacco, Cigars, and/or Pipes): Yes Tobacco use, Select all that apply: 4 or less cigarettes per day Are you interested in Tobacco Cessation Medications?: No, patient refused Counseled patient on smoking cessation including recognizing danger situations, developing coping skills and basic information about quitting provided: Refused/Declined practical counseling Social History:  Social History   Substance and Sexual Activity  Alcohol Use Yes  . Alcohol/week: 24.0 standard drinks  . Types: 24 Cans of beer per week   Comment: ''I can drink a case a day''     Social History   Substance and Sexual Activity  Drug Use Yes  . Types: "Crack" cocaine   Comment: pt reports last use was Wednesday    Additional Social History:                           Allergies:   Allergies  Allergen Reactions  . Shellfish Allergy Anaphylaxis and Swelling    Swelling everywhere  . Sulfa Antibiotics Anaphylaxis, Hives, Itching and Swelling    Swelling all over   . Fish Allergy Swelling  . Clindamycin Diarrhea   Lab Results:  Results for orders placed or performed during the hospital encounter  of 04/20/18 (from the past 48 hour(s))  Lipid panel     Status: Abnormal   Collection Time: 04/21/18  6:27 AM  Result Value Ref Range   Cholesterol 188 0 - 200 mg/dL   Triglycerides 415 (H) <150 mg/dL   HDL 55 >83 mg/dL   Total CHOL/HDL Ratio 3.4 RATIO   VLDL 31 0 - 40 mg/dL   LDL Cholesterol 094 (H) 0 - 99 mg/dL    Comment:        Total Cholesterol/HDL:CHD Risk Coronary Heart Disease Risk Table                     Men   Women  1/2 Average Risk   3.4   3.3  Average Risk       5.0   4.4  2 X Average Risk   9.6   7.1  3 X Average Risk  23.4   11.0        Use the calculated Patient Ratio above and the CHD Risk Table to determine the patient's CHD Risk.        ATP III CLASSIFICATION (LDL):  <100     mg/dL   Optimal  076-808  mg/dL   Near or Above                    Optimal   130-159  mg/dL   Borderline  811-031  mg/dL   High  >594     mg/dL   Very High Performed at [facility], [address], [place], [place] [address]   TSH     Status: None   Collection Time: 04/21/18  6:27 AM  Result Value Ref Range   TSH 2.268 0.350 - 4.500 uIU/mL    Comment: Performed by a 3rd Generation assay with a functional sensitivity of <=0.01 uIU/mL. Performed at [facility], [address], [place], [place] [address]     Blood Alcohol level:  Lab Results  Component Value Date   ETH 61 (H) 04/18/2018   ETH 59 (H) 04/07/2018    Metabolic Disorder Labs:  Lab Results  Component Value Date   HGBA1C 6.0 (H) 06/27/2017   MPG 125.5 06/27/2017   No results found for: PROLACTIN Lab Results  Component Value Date   CHOL 188 04/21/2018   TRIG 154 (H) 04/21/2018   HDL 55 04/21/2018   CHOLHDL 3.4 04/21/2018   VLDL 31 04/21/2018   LDLCALC 102 (H) 04/21/2018   LDLCALC 79 02/26/2015    Current Medications: Current Facility-Administered Medications  Medication Dose Route Frequency Provider Last Rate Last Dose  . acetaminophen (TYLENOL) tablet 650 mg  650 mg Oral Q6H PRN Oneta Rack, NP   650 mg at 04/21/18 0813  . alum & mag hydroxide-simeth (MAALOX/MYLANTA) 200-200-20 MG/5ML suspension 30 mL  30 mL Oral Q4H PRN Oneta Rack, NP      . buPROPion (WELLBUTRIN XL) 24 hr tablet 300 mg  300 mg Oral Daily Antonieta Pert, MD   300 mg at 04/21/18 1012  . darunavir-cobicistat (PREZCOBIX) 800-150 MG per tablet 1 tablet  1 tablet Oral Q breakfast Oneta Rack, NP   1 tablet at 04/21/18 [phone]  . emtricitabine-tenofovir AF (DESCOVY) 200-25 MG per tablet 1 tablet  1 tablet Oral Daily Oneta Rack, NP   1 tablet at 04/21/18 [phone]  . gabapentin (NEURONTIN) capsule 300 mg  300 mg Oral TID Oneta Rack, NP  300 mg at 04/21/18 4098  . hydrOXYzine (ATARAX/VISTARIL) tablet 25 mg  25 mg Oral Q6H PRN Oneta Rack, NP      . hydrOXYzine  (ATARAX/VISTARIL) tablet 25 mg  25 mg Oral TID PRN Oneta Rack, NP      . loperamide (IMODIUM) capsule 2-4 mg  2-4 mg Oral PRN Oneta Rack, NP      . LORazepam (ATIVAN) tablet 1 mg  1 mg Oral Q6H PRN Oneta Rack, NP      . magnesium hydroxide (MILK OF MAGNESIA) suspension 30 mL  30 mL Oral Daily PRN Oneta Rack, NP      . multivitamin with minerals tablet 1 tablet  1 tablet Oral Daily Oneta Rack, NP   1 tablet at 04/21/18 [phone]  . ondansetron (ZOFRAN-ODT) disintegrating tablet 4 mg  4 mg Oral Q6H PRN Oneta Rack, NP      . pantoprazole (PROTONIX) EC tablet 20 mg  20 mg Oral Daily Oneta Rack, NP   20 mg at 04/21/18 4782  . thiamine (B-1) injection 100 mg  100 mg Intramuscular Once Oneta Rack, NP      . thiamine (VITAMIN B-1) tablet 100 mg  100 mg Oral Daily Oneta Rack, NP   100 mg at 04/21/18 9562  . topiramate (TOPAMAX) tablet 100 mg  100 mg Oral BID Oneta Rack, NP   100 mg at 04/21/18 1308  . traZODone (DESYREL) tablet 50 mg  50 mg Oral QHS PRN Oneta Rack, NP       PTA Medications: Medications Prior to Admission  Medication Sig Dispense Refill Last Dose  . aspirin EC 81 MG tablet Take 81 mg by mouth daily.   Past Week at Unknown time  . baclofen (LIORESAL) 10 MG tablet Take 20 mg by mouth 3 (three) times daily.    Past Week at Unknown time  . buPROPion (WELLBUTRIN XL) 150 MG 24 hr tablet Take 1 tablet (150 mg total) by mouth daily. (Patient not taking: Reported on 04/19/2018) 30 tablet 0 Not Taking at Unknown time  . darunavir-cobicistat (PREZCOBIX) 800-150 MG tablet Take 1 tablet by mouth daily. Swallow whole. Do NOT crush, break or chew tablets. Take with food, take with Descovy. 30 tablet 2 Past Week at Unknown time  . emtricitabine-tenofovir AF (DESCOVY) 200-25 MG tablet Take 1 tablet by mouth daily. Take with Prezcobix. 30 tablet 2 Past Week at Unknown time  . gabapentin (NEURONTIN) 300 MG capsule Take 1 capsule (300 mg total) by mouth 3  (three) times daily. 90 capsule 2 Past Week at Unknown time  . hydrOXYzine (ATARAX/VISTARIL) 25 MG tablet Take 1 tablet (25 mg total) by mouth every 6 (six) hours as needed (anxiety/agitation or CIWA < or = 10). (Patient not taking: Reported on 04/19/2018) 30 tablet 0 Not Taking at Unknown time  . pantoprazole (PROTONIX) 40 MG tablet Take 40 mg by mouth daily.   Past Week at Unknown time  . topiramate (TOPAMAX) 100 MG tablet Take 1 tablet (100 mg total) by mouth 3 (three) times daily. 90 tablet 2 Past Week at Unknown time    Musculoskeletal: Strength & Muscle Tone: within normal limits Gait & Station: shuffle Patient leans: N/A  Psychiatric Specialty Exam: Physical Exam  Nursing note and vitals reviewed. Constitutional: He is oriented to person, place, and time. He appears well-developed and well-nourished.  HENT:  Head: Normocephalic and atraumatic.  Respiratory: Effort normal.  Neurological: He is  alert and oriented to person, place, and time.    ROS  Blood pressure (!) 150/97, pulse 84, temperature 99 F (37.2 C), temperature source Oral, resp. rate 18, height 6' (1.829 m), weight 75.3 kg.Body mass index is 22.51 kg/m.  General Appearance: Casual  Eye Contact:  Minimal  Speech:  Normal Rate  Volume:  Increased  Mood:  Dysphoric and Irritable  Affect:  Congruent  Thought Process:  Coherent and Descriptions of Associations: Intact  Orientation:  Full (Time, Place, and Person)  Thought Content:  Logical  Suicidal Thoughts:  Yes.  without intent/plan  Homicidal Thoughts:  No  Memory:  Immediate;   Poor Recent;   Poor Remote;   Poor  Judgement:  Intact  Insight:  Lacking  Psychomotor Activity:  Increased  Concentration:  Concentration: Fair and Attention Span: Fair  Recall:  [employer] of Knowledge:  Fair  Language:  Good  Akathisia:  Negative  Handed:  Right  AIMS (if indicated):     Assets:  Desire for Improvement Resilience  ADL's:  Intact  Cognition:  WNL  Sleep:   Number of Hours: 5    Treatment Plan Summary: Daily contact with patient to assess and evaluate symptoms and progress in treatment, Medication management and Plan Patient is seen and examined.  Patient is a 58 year old male with a past psychiatric history significant for polysubstance dependence issues specifically alcohol dependence and cocaine dependence as well as a past medical history significant for HIV, cervical stenosis.  I also believe that there is a large component of secondary gain here.  He will be admitted to the hospital.  He will be integrated into the milieu.  His medications will be continued including the Wellbutrin XL 300 mg p.o. daily.  He will be placed on lorazepam 1 mg p.o. every 6 hours as needed CIWA greater than 10.  He underwent detox on his last hospitalization, and the electronic medical record reveals that he also underwent a detox protocol on 04/16/2018 at [facility].  He also has been sitting in the emergency department since 04/18/2018.  His drug screen was positive for benzodiazepines as well as cocaine in the emergency department.  His blood alcohol was 61.  He is mildly anemic, and his MCV, MCH and MCHC are all slightly low.  He will be on folic acid and thiamine for that.  His HIV medications will be continued.  He will also be put on gabapentin 300 mg p.o. 3 times daily for his chronic pain issues.  Again I think this hospitalization has a large degree of secondary gain, and I believe the patient is seeking housing.  Observation Level/Precautions:  15 minute checks  Laboratory:  Chemistry Profile  Psychotherapy:    Medications:    Consultations:    Discharge Concerns:    Estimated LOS:  Other:     Physician Treatment Plan for Primary Diagnosis: <principal problem not specified> Long Term Goal(s): Improvement in symptoms so as ready for discharge  Short Term Goals: Ability to identify changes in lifestyle to reduce recurrence of condition will improve, Ability to  verbalize feelings will improve, Ability to disclose and discuss suicidal ideas, Ability to demonstrate self-control will improve, Ability to identify and develop effective coping behaviors will improve, Ability to maintain clinical measurements within normal limits will improve, Compliance with prescribed medications will improve and Ability to identify triggers associated with substance abuse/mental health issues will improve  Physician Treatment Plan for Secondary Diagnosis: Active Problems:   MDD (  major depressive disorder), recurrent episode (HCC)  Long Term Goal(s): Improvement in symptoms so as ready for discharge  Short Term Goals: Ability to identify changes in lifestyle to reduce recurrence of condition will improve, Ability to verbalize feelings will improve, Ability to disclose and discuss suicidal ideas, Ability to demonstrate self-control will improve, Ability to identify and develop effective coping behaviors will improve, Ability to maintain clinical measurements within normal limits will improve, Compliance with prescribed medications will improve and Ability to identify triggers associated with substance abuse/mental health issues will improve  I certify that inpatient services furnished can reasonably be expected to improve the patient's condition.    Antonieta PertGreg Lawson Shanedra Lave, MD 4/12/202010:54 AM

## 2018-04-21 NOTE — Tx Team (Signed)
Initial Treatment Plan 04/21/2018 1:19 AM [name] MVH:[id number]    PATIENT STRESSORS: Loss of housing Substance abuse   PATIENT STRENGTHS: Ability for insight Active sense of humor Average or above average intelligence Supportive family/friends   PATIENT IDENTIFIED PROBLEMS: Depression  SI  Substance abuse        " To find a place to stay"         DISCHARGE CRITERIA:  Improved stabilization in mood, thinking, and/or behavior  PRELIMINARY DISCHARGE PLAN: Placement in alternative living arrangements  PATIENT/FAMILY INVOLVEMENT: This treatment plan has been presented to and reviewed with the patient, [name], and/or family member.  The patient and family have been given the opportunity to ask questions and make suggestions.  Floyce Stakes, RN 04/21/2018, 1:19 AM

## 2018-04-21 NOTE — [facility] Group Notes (Signed)
[facility] LCSW Group Therapy Note  Date/Time:  04/21/2018  11:00AM-12:00PM  Type of Therapy and Topic:  Group Therapy:  Music and Mood  Participation Level:  Did Not Attend   Description of Group: In this process group, members listened to a variety of genres of music and identified that different types of music evoke different responses.  Patients were encouraged to identify music that was soothing for them and music that was energizing for them.  Patients discussed how this knowledge can help with wellness and recovery in various ways including managing depression and anxiety as well as encouraging healthy sleep habits.    Therapeutic Goals: 1. Patients will explore the impact of different varieties of music on mood 2. Patients will verbalize the thoughts they have when listening to different types of music 3. Patients will identify music that is soothing to them as well as music that is energizing to them 4. Patients will discuss how to use this knowledge to assist in maintaining wellness and recovery 5. Patients will explore the use of music as a coping skill  Summary of Patient Progress:  N/A  Therapeutic Modalities: Solution Focused Brief Therapy Activity   Misk Galentine Grossman-Orr, LCSW    

## 2018-04-21 NOTE — Progress Notes (Signed)
Patient admitted voluntarily for depression and suicidal ideations. He reports that he is currently homeless after paying a friend to stay with him at his home and he took his money. He reports that he is tired of living and dealing with his pain related to spinal stenosis and dealing with his HIV. He reports drinking a case of beer weekly and last used cocaine 04/18/2018. He is currently using a walker and is unsteady on his feet. He reoprts weakness in both legs. He was oriented to unit, safety maintained on unit 15 min checks.

## 2018-04-21 NOTE — Plan of Care (Signed)
  Problem: Coping: Goal: Ability to verbalize frustrations and anger appropriately will improve Outcome: Progressing    D: Pt alert and oriented on the unit. Pt denies HI and A/VH, but endorses passive SI. Pt's affect was flat and mood depressed. Pt stated "I had spine surgery and a lot of things are stressing me." Pt is cooperative on the unit. A: Education, support and encouragement provided, q15 minute safety checks remain in effect. Medications administered per MD orders. R: No reactions/side effects to medicine noted. Pt verbally contracts for safety. Pt ambulating on the unit with no issues. Pt remains safe on and off the unit.

## 2018-04-22 LAB — PROLACTIN: Prolactin: 29.9 ng/mL — ABNORMAL HIGH (ref 4.0–15.2)

## 2018-04-22 LAB — HEMOGLOBIN A1C
Hgb A1c MFr Bld: 5.3 % (ref 4.8–5.6)
Mean Plasma Glucose: 105 mg/dL

## 2018-04-22 NOTE — [facility] Suicide Risk Assessment (Signed)
[facility] Discharge Suicide Risk Assessment   Principal Problem: Alcohol dependence with alcohol-induced mood disorder [facility]) Discharge Diagnoses: Principal Problem:   Alcohol dependence with alcohol-induced mood disorder (HCC) Active Problems:   HIV disease (HCC)   Substance use disorder   Polysubstance dependence (HCC)   Left-sided weakness   MDD (major depressive disorder), recurrent episode (HCC)   Total Time spent with patient: 45 minutes   Mental Status Per Nursing Assessment::   On Admission:  Suicidal ideation indicated by patient, Thoughts of violence towards others Currently requesting discharge alert oriented with no thoughts of harming self or others contracting fully Demographic Factors:  Male  Loss Factors: Financial problems/change in socioeconomic status  Historical Factors: Impulsivity  Risk Reduction Factors:   Sense of responsibility to family and Religious beliefs about death  Continued Clinical Symptoms:  Alcohol/Substance Abuse/Dependencies  Cognitive Features That Contribute To Risk:  None    Suicide Risk:  Minimal: No identifiable suicidal ideation.  Patients presenting with no risk factors but with morbid ruminations; may be classified as minimal risk based on the severity of the depressive symptoms    Plan Of Care/Follow-up recommendations:  Activity:  full  Finneas Mathe, MD 04/22/2018, 9:59 AM

## 2018-04-22 NOTE — Progress Notes (Signed)
Recreation Therapy Notes  Date: 4.13.20 Time: 1000 Location: 500 Hall Dayroom  Group Topic: Coping Skills  Goal Area(s) Addresses:  Patient will identify unhealthy coping strategies and consequences of using them. Patient will identify healthy coping strategies. Patient will identify barriers to using positive coping strategies.  Intervention: Worksheet  Activity:  Unhealthy vs. Healthy Coping Strategies.  Patients were to identify a problem they are currently facing.  Patients were to then identify the unhealthy coping strategies used to deal with the problem and the consequences of those unhealthy coping strategies.  Patients would then identify the healthy coping strategies and barriers that prevent the use of those strategies.  Education: Pharmacologist, Building control surveyor.   Education Outcome: Acknowledges understanding/In group clarification offered/Needs additional education.   Clinical Observations/Feedback:  Pt did not attend group.    Caroll Rancher, LRT/CTRS         Caroll Rancher A 04/22/2018 12:55 PM

## 2018-04-22 NOTE — Discharge Summary (Signed)
Physician Discharge Summary Note  Patient:  [name] is an 58 y.o., male MRN:  [id number] DOB:  [birth year] Patient phone:  [phone] (home)  Patient address:   [place] [address],  Total Time spent with patient: 45 minutes  Date of Admission:  04/20/2018 Date of Discharge: 04/22/18  Reason for Admission:    Patient well-known to the service last released within the week he basically is homeless presented to other institutions before returning back here for evaluation reporting numerous symptoms to include depression suicidal thoughts homicidal thoughts was certainly given the benefit of the doubt and monitored over the weekend see the admission note  Principal Problem: Alcohol dependence with alcohol-induced mood disorder [facility]) Discharge Diagnoses: Principal Problem:   Alcohol dependence with alcohol-induced mood disorder (HCC) Active Problems:   HIV disease (HCC)   Substance use disorder   Polysubstance dependence (HCC)   Left-sided weakness   MDD (major depressive disorder), recurrent episode (HCC)  Past Medical History:  Past Medical History:  Diagnosis Date  . 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 Hemorrhoids   . HIV (human immunodeficiency virus infection) (HCC)   . Stenosis of cervical spine    withb myelopathy    Past Surgical History:  Procedure Laterality Date  . ANTERIOR CERVICAL DECOMP/DISCECTOMY FUSION N/A 07/06/2017   Procedure: ANTERIOR CERVICAL DECOMPRESSION/DISCECTOMY FUSION C3-4/C4-5 2 LEVELS;  Surgeon: Lisbeth Renshaw, MD;  Location: [facility];  Service: Neurosurgery;  Laterality: N/A;  . HERNIA REPAIR  97   lt ing  . RECTAL EXAM UNDER ANESTHESIA N/A 04/24/2014   Procedure:  ligation of bleeding vessels;  Surgeon: Almond Lint, MD;  Location: [facility];  Service: General;  Laterality: N/A;  . TENDON REPAIR     2006-lt index  . WART FULGURATION  07/06/2011   Procedure: FULGURATION ANAL WART;  Surgeon: Shelly Rubenstein, MD;  Location: [facility];  Service: General;  Laterality: N/A;  excision anal condyloma  . WART FULGURATION N/A 04/23/2014   Procedure: EXCISION OF ANAL CONDYLOMA;  Surgeon: Abigail Miyamoto, MD;  Location: [facility];  Service: General;  Laterality: N/A;   Family History:  Family History  Problem Relation Age of Onset  . Cancer Mother        brain  . Cancer Sister        breast    Social History:  Social History   Substance and Sexual Activity  Alcohol Use Yes  . Alcohol/week: 24.0 standard drinks  . Types: 24 Cans of beer per week   Comment: ''I can drink a case a day''     Social History   Substance and Sexual Activity  Drug Use Yes  . Types: "Crack" cocaine   Comment: pt reports last use was Wednesday    Social History   Socioeconomic History  . Marital status: Single    Spouse name: Not on file  . Number of children: Not on file  . Years of education: Not on file  . Highest education level: Not on file  Occupational History  . Occupation: Unemployed  Social Needs  . Financial resource strain: Not on file  . Food insecurity:    Worry: Not on file    Inability: Not on file  . Transportation needs:    Medical: Not on file    Non-medical: Not on file  Tobacco Use  . Smoking status: Current Every Day Smoker    Packs/day: 0.50    Years: 25.00    Pack years: 12.50  Types: Cigarettes  . Smokeless tobacco: Never Used  Substance and Sexual Activity  . Alcohol use: Yes    Alcohol/week: 24.0 standard drinks    Types: 24 Cans of beer per week    Comment: ''I can drink a case a day''  . Drug use: Yes    Types: "Crack" cocaine    Comment: pt reports last use was Wednesday  . Sexual activity: Not Currently  Lifestyle  . Physical activity:    Days per week: Not on file    Minutes per session: Not on file  . Stress: Not on file  Relationships  . Social connections:    Talks on phone: Not on file    Gets together: Not on file    Attends religious service: Not on file     Active member of club or organization: Not on file    Attends meetings of clubs or organizations: Not on file    Relationship status: Not on file  Other Topics Concern  . Not on file  Social History Narrative   Pt lives alone; receives social security.    Hospital Course:    Patient generally stayed in bed did not participate fully or meaningfully.  He acknowledges that his discussions with me last time were not refilled but he did not indeed have a place to stay and was hoping to find hospitalization for housing purposes when he failed to find it to and institutions came here was admitted for observation.  Over the weekend displayed no danger behaviors by the date of the 13th alert oriented irritable and more cordial with me than other staff members somewhat irritable and rude to other staff at any rate no thoughts of harming self or others clearly an issue of malingering and secondary gain, contracting fully is discharged home  Physical Findings: AIMS: Facial and Oral Movements Muscles of Facial Expression: None, normal Lips and Perioral Area: None, normal Jaw: None, normal Tongue: None, normal,Extremity Movements Upper (arms, wrists, hands, fingers): None, normal Lower (legs, knees, ankles, toes): None, normal, Trunk Movements Neck, shoulders, hips: None, normal, Overall Severity Severity of abnormal movements (highest score from questions above): None, normal Incapacitation due to abnormal movements: None, normal Patient's awareness of abnormal movements (rate only patient's report): No Awareness, Dental Status Current problems with teeth and/or dentures?: No Does patient usually wear dentures?: No  CIWA:  CIWA-Ar Total: 0 COWS:     Musculoskeletal: Strength & Muscle Tone: within normal limits Gait & Station: normal Patient leans: N/A  Psychiatric Specialty Exam: Physical Exam  ROS  Blood pressure 118/89, pulse 84, temperature 97.7 F (36.5 C), temperature source Oral,  resp. rate 18, height 6' (1.829 m), weight 75.3 kg.Body mass index is 22.51 kg/m.  General Appearance: Casual  Eye Contact:  Fair  Speech:  Clear and Coherent  Volume:  Decreased  Mood:  Angry and Irritable  Affect:  Appropriate  Thought Process:  Coherent  Orientation:  Full (Time, Place, and Person)  Thought Content:  Tangential  Suicidal Thoughts:  No  Homicidal Thoughts:  No  Memory:  Immediate;   Fair  Judgement:  Fair  Insight:  Fair  Psychomotor Activity:  Normal  Concentration:  Concentration: Good  Recall:  Fair  Fund of Knowledge:  Good  Language:  Fair  Akathisia:  Negative  Handed:  Right  AIMS (if indicated):     Assets:  Leisure Time Physical Health  ADL's:  Intact  Cognition:  WNL  Sleep:  Number of  Hours: 5.75     Have you used any form of tobacco in the last 30 days? (Cigarettes, Smokeless Tobacco, Cigars, and/or Pipes): Yes  Has this patient used any form of tobacco in the last 30 days? (Cigarettes, Smokeless Tobacco, Cigars, and/or Pipes) Yes, No  Blood Alcohol level:  Lab Results  Component Value Date   ETH 61 (H) 04/18/2018   ETH 59 (H) 04/07/2018    Metabolic Disorder Labs:  Lab Results  Component Value Date   HGBA1C 5.3 04/21/2018   MPG 105 04/21/2018   MPG 125.5 06/27/2017   Lab Results  Component Value Date   PROLACTIN 29.9 (H) 04/21/2018   Lab Results  Component Value Date   CHOL 188 04/21/2018   TRIG 154 (H) 04/21/2018   HDL 55 04/21/2018   CHOLHDL 3.4 04/21/2018   VLDL 31 04/21/2018   LDLCALC 102 (H) 04/21/2018   LDLCALC 79 02/26/2015    See Psychiatric Specialty Exam and Suicide Risk Assessment completed by Attending Physician prior to discharge.  Discharge destination:  Home  Is patient on multiple antipsychotic therapies at discharge:  No   Has Patient had three or more failed trials of antipsychotic monotherapy by history:  No  Recommended Plan for Multiple Antipsychotic Therapies: NA   Allergies as of  04/22/2018      Reactions   Shellfish Allergy Anaphylaxis, Swelling   Swelling everywhere   Sulfa Antibiotics Anaphylaxis, Hives, Itching, Swelling   Swelling all over    Fish Allergy Swelling   Clindamycin Diarrhea      Medication List    TAKE these medications     Indication  aspirin EC 81 MG tablet Take 81 mg by mouth daily.  Indication:  Pain   baclofen 10 MG tablet Commonly known as:  LIORESAL Take 20 mg by mouth 3 (three) times daily.  Indication:  Muscle Spasticity   buPROPion 150 MG 24 hr tablet Commonly known as:  WELLBUTRIN XL Take 1 tablet (150 mg total) by mouth daily.  Indication:  Major Depressive Disorder   darunavir-cobicistat 800-150 MG tablet Commonly known as:  Prezcobix Take 1 tablet by mouth daily. Swallow whole. Do NOT crush, break or chew tablets. Take with food, take with Descovy.  Indication:  HIV Disease   emtricitabine-tenofovir AF 200-25 MG tablet Commonly known as:  DESCOVY Take 1 tablet by mouth daily. Take with Prezcobix.  Indication:  HIV Disease   gabapentin 300 MG capsule Commonly known as:  NEURONTIN Take 1 capsule (300 mg total) by mouth 3 (three) times daily.  Indication:  Neuropathic Pain   hydrOXYzine 25 MG tablet Commonly known as:  ATARAX/VISTARIL Take 1 tablet (25 mg total) by mouth every 6 (six) hours as needed (anxiety/agitation or CIWA < or = 10).  Indication:  Feeling Anxious, Tension   pantoprazole 40 MG tablet Commonly known as:  PROTONIX Take 40 mg by mouth daily.  Indication:  Heartburn   topiramate 100 MG tablet Commonly known as:  TOPAMAX Take 1 tablet (100 mg total) by mouth 3 (three) times daily.  Indication:  Cluster Headache       Had been given numerous resources during his last admission for housing and follow-up but did not seek them.  SignedMalvin Johns, MD 04/22/2018, 10:01 AM

## 2018-04-22 NOTE — Progress Notes (Signed)
  [facility] Adult Case Management Discharge Plan :  Will you be returning to the same living situation after discharge:  No. Pt refused to disclose.  At discharge, do you have transportation home?: No. Bus fare currently free.  Do you have the ability to pay for your medications: Yes,  medicare/medicaid  Release of information consent forms completed and in the chart;  Patient's signature needed at discharge.  Patient to Follow up at: Follow-up Information    Pt declined follow up. Follow up.           Next level of care provider has access to [facility] Link:no  Safety Planning and Suicide Prevention discussed: No. Pt declined consent  Have you used any form of tobacco in the last 30 days? (Cigarettes, Smokeless Tobacco, Cigars, and/or Pipes): Yes  Has patient been referred to the Quitline?: Patient refused referral  Patient has been referred for addiction treatment: Pt. refused referral  Lorri Frederick, LCSW 04/22/2018, 12:47 PM

## 2018-04-22 NOTE — Tx Team (Signed)
Interdisciplinary Treatment and Diagnostic Plan Update  04/22/2018 Time of Session: 0900 [name] MRN: [id number]  Principal Diagnosis: Alcohol dependence with alcohol-induced mood disorder (HCC)  Secondary Diagnoses: Principal Problem:   Alcohol dependence with alcohol-induced mood disorder (HCC) Active Problems:   HIV disease (HCC)   Substance use disorder   Polysubstance dependence (HCC)   Left-sided weakness   MDD (major depressive disorder), recurrent episode (HCC)   Current Medications:  Current Facility-Administered Medications  Medication Dose Route Frequency Provider Last Rate Last Dose  . alum & mag hydroxide-simeth (MAALOX/MYLANTA) 200-200-20 MG/5ML suspension 30 mL  30 mL Oral Q4H PRN Oneta Rack, NP      . buPROPion (WELLBUTRIN XL) 24 hr tablet 300 mg  300 mg Oral Daily Antonieta Pert, MD   300 mg at 04/22/18 0817  . darunavir-cobicistat (PREZCOBIX) 800-150 MG per tablet 1 tablet  1 tablet Oral Q breakfast Oneta Rack, NP   1 tablet at 04/22/18 0817  . emtricitabine-tenofovir AF (DESCOVY) 200-25 MG per tablet 1 tablet  1 tablet Oral Daily Oneta Rack, NP   1 tablet at 04/22/18 0817  . gabapentin (NEURONTIN) capsule 300 mg  300 mg Oral TID Oneta Rack, NP   300 mg at 04/22/18 1210  . hydrOXYzine (ATARAX/VISTARIL) tablet 25 mg  25 mg Oral Q6H PRN Oneta Rack, NP      . hydrOXYzine (ATARAX/VISTARIL) tablet 25 mg  25 mg Oral TID PRN Oneta Rack, NP      . ibuprofen (ADVIL,MOTRIN) tablet 400 mg  400 mg Oral Q6H PRN Antonieta Pert, MD   400 mg at 04/22/18 0211  . loperamide (IMODIUM) capsule 2-4 mg  2-4 mg Oral PRN Oneta Rack, NP      . LORazepam (ATIVAN) tablet 1 mg  1 mg Oral Q6H PRN Oneta Rack, NP      . magnesium hydroxide (MILK OF MAGNESIA) suspension 30 mL  30 mL Oral Daily PRN Oneta Rack, NP      . multivitamin with minerals tablet 1 tablet  1 tablet Oral Daily Oneta Rack, NP   1 tablet at 04/22/18 0817  .  ondansetron (ZOFRAN-ODT) disintegrating tablet 4 mg  4 mg Oral Q6H PRN Oneta Rack, NP      . pantoprazole (PROTONIX) EC tablet 20 mg  20 mg Oral Daily Oneta Rack, NP   20 mg at 04/22/18 0817  . thiamine (B-1) injection 100 mg  100 mg Intramuscular Once Oneta Rack, NP      . thiamine (VITAMIN B-1) tablet 100 mg  100 mg Oral Daily Oneta Rack, NP   100 mg at 04/22/18 0817  . topiramate (TOPAMAX) tablet 100 mg  100 mg Oral BID Oneta Rack, NP   100 mg at 04/22/18 0817  . traZODone (DESYREL) tablet 50 mg  50 mg Oral QHS PRN Oneta Rack, NP       Current Outpatient Medications  Medication Sig Dispense Refill  . aspirin EC 81 MG tablet Take 81 mg by mouth daily.    . baclofen (LIORESAL) 10 MG tablet Take 20 mg by mouth 3 (three) times daily.     Marland Kitchen buPROPion (WELLBUTRIN XL) 150 MG 24 hr tablet Take 1 tablet (150 mg total) by mouth daily. (Patient not taking: Reported on 04/19/2018) 30 tablet 0  . darunavir-cobicistat (PREZCOBIX) 800-150 MG tablet Take 1 tablet by mouth daily. Swallow whole. Do NOT crush, break or chew  tablets. Take with food, take with Descovy. 30 tablet 2  . emtricitabine-tenofovir AF (DESCOVY) 200-25 MG tablet Take 1 tablet by mouth daily. Take with Prezcobix. 30 tablet 2  . gabapentin (NEURONTIN) 300 MG capsule Take 1 capsule (300 mg total) by mouth 3 (three) times daily. 90 capsule 2  . hydrOXYzine (ATARAX/VISTARIL) 25 MG tablet Take 1 tablet (25 mg total) by mouth every 6 (six) hours as needed (anxiety/agitation or CIWA < or = 10). (Patient not taking: Reported on 04/19/2018) 30 tablet 0  . pantoprazole (PROTONIX) 40 MG tablet Take 40 mg by mouth daily.    Marland Kitchen. topiramate (TOPAMAX) 100 MG tablet Take 1 tablet (100 mg total) by mouth 3 (three) times daily. 90 tablet 2   PTA Medications: No medications prior to admission.    Patient Stressors: Loss of housing Substance abuse  Patient Strengths: Ability for insight Active sense of humor Average or above  average intelligence Supportive family/friends  Treatment Modalities: Medication Management, Group therapy, Case management,  1 to 1 session with clinician, Psychoeducation, Recreational therapy.   Physician Treatment Plan for Primary Diagnosis: Alcohol dependence with alcohol-induced mood disorder (HCC) Long Term Goal(s): Improvement in symptoms so as ready for discharge Improvement in symptoms so as ready for discharge   Short Term Goals: Ability to identify changes in lifestyle to reduce recurrence of condition will improve Ability to verbalize feelings will improve Ability to disclose and discuss suicidal ideas Ability to demonstrate self-control will improve Ability to identify and develop effective coping behaviors will improve Ability to maintain clinical measurements within normal limits will improve Compliance with prescribed medications will improve Ability to identify triggers associated with substance abuse/mental health issues will improve Ability to identify changes in lifestyle to reduce recurrence of condition will improve Ability to verbalize feelings will improve Ability to disclose and discuss suicidal ideas Ability to demonstrate self-control will improve Ability to identify and develop effective coping behaviors will improve Ability to maintain clinical measurements within normal limits will improve Compliance with prescribed medications will improve Ability to identify triggers associated with substance abuse/mental health issues will improve  Medication Management: Evaluate patient's response, side effects, and tolerance of medication regimen.  Therapeutic Interventions: 1 to 1 sessions, Unit Group sessions and Medication administration.  Evaluation of Outcomes: Adequate for Discharge  Physician Treatment Plan for Secondary Diagnosis: Principal Problem:   Alcohol dependence with alcohol-induced mood disorder (HCC) Active Problems:   HIV disease (HCC)    Substance use disorder   Polysubstance dependence (HCC)   Left-sided weakness   MDD (major depressive disorder), recurrent episode (HCC)  Long Term Goal(s): Improvement in symptoms so as ready for discharge Improvement in symptoms so as ready for discharge   Short Term Goals: Ability to identify changes in lifestyle to reduce recurrence of condition will improve Ability to verbalize feelings will improve Ability to disclose and discuss suicidal ideas Ability to demonstrate self-control will improve Ability to identify and develop effective coping behaviors will improve Ability to maintain clinical measurements within normal limits will improve Compliance with prescribed medications will improve Ability to identify triggers associated with substance abuse/mental health issues will improve Ability to identify changes in lifestyle to reduce recurrence of condition will improve Ability to verbalize feelings will improve Ability to disclose and discuss suicidal ideas Ability to demonstrate self-control will improve Ability to identify and develop effective coping behaviors will improve Ability to maintain clinical measurements within normal limits will improve Compliance with prescribed medications will improve Ability to identify triggers  associated with substance abuse/mental health issues will improve     Medication Management: Evaluate patient's response, side effects, and tolerance of medication regimen.  Therapeutic Interventions: 1 to 1 sessions, Unit Group sessions and Medication administration.  Evaluation of Outcomes: Adequate for Discharge   RN Treatment Plan for Primary Diagnosis: Alcohol dependence with alcohol-induced mood disorder (HCC) Long Term Goal(s): Knowledge of disease and therapeutic regimen to maintain health will improve  Short Term Goals: Ability to identify and develop effective coping behaviors will improve and Compliance with prescribed medications will  improve  Medication Management: RN will administer medications as ordered by provider, will assess and evaluate patient's response and provide education to patient for prescribed medication. RN will report any adverse and/or side effects to prescribing provider.  Therapeutic Interventions: 1 on 1 counseling sessions, Psychoeducation, Medication administration, Evaluate responses to treatment, Monitor vital signs and CBGs as ordered, Perform/monitor CIWA, COWS, AIMS and Fall Risk screenings as ordered, Perform wound care treatments as ordered.  Evaluation of Outcomes: Adequate for Discharge   LCSW Treatment Plan for Primary Diagnosis: Alcohol dependence with alcohol-induced mood disorder (HCC) Long Term Goal(s): Safe transition to appropriate next level of care at discharge, Engage patient in therapeutic group addressing interpersonal concerns.  Short Term Goals: Engage patient in aftercare planning with referrals and resources, Increase social support and Increase skills for wellness and recovery  Therapeutic Interventions: Assess for all discharge needs, 1 to 1 time with Social worker, Explore available resources and support systems, Assess for adequacy in community support network, Educate family and significant other(s) on suicide prevention, Complete Psychosocial Assessment, Interpersonal group therapy.  Evaluation of Outcomes: Adequate for Discharge   Progress in Treatment: Attending groups: No. Participating in groups: No. Taking medication as prescribed: Yes. Toleration medication: Yes. Family/Significant other contact made: No, will contact:  pt declined consent Patient understands diagnosis: Yes. Discussing patient identified problems/goals with staff: No. Medical problems stabilized or resolved: Yes. Denies suicidal/homicidal ideation: Yes. Issues/concerns per patient self-inventory: No. Other: none  New problem(s) identified: No, Describe:  none  New Short Term/Long Term  Goal(s):  Patient Goals:  "discharge"  Discharge Plan or Barriers:   Reason for Continuation of Hospitalization: Other; describe none: discharge today.  Estimated Length of Stay: discharge today  Attendees: Patient: [name] 04/22/2018   Physician: Dr. Jeannine Kitten, MD 04/22/2018   Nursing:  04/22/2018   RN Care Manager: 04/22/2018   Social Worker: Daleen Squibb, LCSW 04/22/2018   Recreational Therapist:  04/22/2018   Other:  04/22/2018   Other:  04/22/2018   Other: 04/22/2018     Scribe for Treatment Team: Lorri Frederick, LCSW 04/22/2018 2:59 PM

## 2018-04-22 NOTE — Progress Notes (Signed)
Discharge note: Patient reviewed discharge paperwork with RN including prescriptions, follow up appointments, and lab work. Patient given the opportunity to ask questions. All concerns were addressed. All belongings were returned to patient. Denied SI/HI/AVH. Patient thanked staff for their care while at the hospital.  Patient was discharged to lobby where he was taking the bus to a shelter in [place].

## 2018-04-22 NOTE — Plan of Care (Signed)
  Problem: Education: Goal: Knowledge of [facility] Education information/materials will improve Outcome: Progressing Goal: Emotional status will improve Outcome: Progressing Goal: Mental status will improve Outcome: Progressing Goal: Verbalization of understanding the information provided will improve Outcome: Progressing   

## 2018-04-22 NOTE — Progress Notes (Signed)
CSW met with pt to discuss discharge plan.  Pt irritable, did not want to discuss where he would be living, did not want to discuss any follow up.  Pt stating he wants to be discharged ASAP today so he can "take care of some things."  CSW discussed that bus rides are free currently during the virus and pt said he will take the bus.  Pt declines any follow up.  CSW later informed by RN that pt saying he wants to take [facility] bus to [place] given on [facility] bus, which is also free at this time. Winferd Humphrey, MSW, LCSW Clinical Social Worker 04/22/2018 12:45 PM

## 2018-04-22 NOTE — Progress Notes (Signed)
Writer has observed patient up in the dayroom watching tv and interacting appropriately with peers and staff. He was complaint with medications. He has been ambulating well with his walker. Support given and safety maintained on unit with 15 min checks.

## 2019-01-07 ENCOUNTER — Telehealth: Payer: Self-pay | Admitting: *Deleted

## 2019-01-07 DIAGNOSIS — B20 Human immunodeficiency virus [HIV] disease: Secondary | ICD-10-CM

## 2019-01-07 DIAGNOSIS — Z113 Encounter for screening for infections with a predominantly sexual mode of transmission: Secondary | ICD-10-CM

## 2019-01-07 DIAGNOSIS — Z79899 Other long term (current) drug therapy: Secondary | ICD-10-CM

## 2019-01-07 MED ORDER — EMTRICITABINE-TENOFOVIR AF 200-25 MG PO TABS: 1.0000 | ORAL_TABLET | Freq: Every day | ORAL | 3 refills | Status: DC

## 2019-01-07 MED ORDER — PREZCOBIX 800-150 MG PO TABS: 1.0000 | ORAL_TABLET | Freq: Every day | ORAL | 3 refills | Status: DC

## 2019-01-07 NOTE — Telephone Encounter (Signed)
Patient has been in-patient for alcohol treatment since October.  He is now in intensive outpatient therapy, needs refills of prezcobix, descovy.  These have been continued by his detox program, there have been no missed doses.   RN sent refills to local [facility] (patietn will have transferred to his local pharmacy), scheduled patient for follow up after his anticipated February discharge. Landis Gandy, RN

## 2019-03-18 ENCOUNTER — Other Ambulatory Visit: Payer: Medicare Other

## 2019-03-24 DIAGNOSIS — K567 Ileus, unspecified: Secondary | ICD-10-CM | POA: Insufficient documentation

## 2019-04-01 ENCOUNTER — Encounter: Payer: Medicare Other | Admitting: Internal Medicine

## 2019-04-14 ENCOUNTER — Other Ambulatory Visit: Payer: Medicare Other

## 2019-04-30 ENCOUNTER — Encounter: Payer: Medicare Other | Admitting: Internal Medicine

## 2019-06-06 ENCOUNTER — Telehealth: Payer: Self-pay

## 2019-06-06 NOTE — Telephone Encounter (Signed)
Patient called office today requesting appointment with Dr. Drue Second. Patient missed last appointment due to being in treatment for alcohol abuse.  Informed patient that since its been over a year since last seen in office he will need labs drawn before office visit. Patient is okay with this. Will transfer to scheduling.  Lorenso Courier, [place]

## 2019-06-12 ENCOUNTER — Other Ambulatory Visit: Payer: Self-pay

## 2019-06-12 ENCOUNTER — Other Ambulatory Visit: Payer: Self-pay | Admitting: Internal Medicine

## 2019-06-12 DIAGNOSIS — B20 Human immunodeficiency virus [HIV] disease: Secondary | ICD-10-CM

## 2019-06-12 MED ORDER — EMTRICITABINE-TENOFOVIR AF 200-25 MG PO TABS: 1.0000 | ORAL_TABLET | Freq: Every day | ORAL | 0 refills | Status: DC

## 2019-06-12 MED ORDER — PREZCOBIX 800-150 MG PO TABS: 1.0000 | ORAL_TABLET | Freq: Every day | ORAL | 0 refills | Status: DC

## 2019-06-13 ENCOUNTER — Telehealth: Payer: Self-pay

## 2019-06-13 NOTE — Telephone Encounter (Signed)
Patient received call from office (likely appointment reminder) and requested call back. Attempted x2, phone was answered and then disconnected. Will try again later   Rosanna Randy, RN

## 2019-06-17 ENCOUNTER — Other Ambulatory Visit: Payer: Self-pay

## 2019-06-17 DIAGNOSIS — Z79899 Other long term (current) drug therapy: Secondary | ICD-10-CM

## 2019-06-17 DIAGNOSIS — Z113 Encounter for screening for infections with a predominantly sexual mode of transmission: Secondary | ICD-10-CM

## 2019-06-17 DIAGNOSIS — B20 Human immunodeficiency virus [HIV] disease: Secondary | ICD-10-CM

## 2019-06-18 ENCOUNTER — Other Ambulatory Visit: Payer: Medicare Other

## 2019-06-18 ENCOUNTER — Other Ambulatory Visit: Payer: Self-pay

## 2019-06-18 ENCOUNTER — Other Ambulatory Visit (HOSPITAL_COMMUNITY)
Admission: RE | Admit: 2019-06-18 | Discharge: 2019-06-18 | Disposition: A | Discharge status: Home / Self Care | Payer: Medicare Other | Source: Ambulatory Visit | Attending: Internal Medicine | Admitting: Internal Medicine

## 2019-06-18 DIAGNOSIS — Z79899 Other long term (current) drug therapy: Secondary | ICD-10-CM

## 2019-06-18 DIAGNOSIS — Z113 Encounter for screening for infections with a predominantly sexual mode of transmission: Secondary | ICD-10-CM

## 2019-06-18 DIAGNOSIS — B20 Human immunodeficiency virus [HIV] disease: Secondary | ICD-10-CM

## 2019-06-19 ENCOUNTER — Other Ambulatory Visit: Payer: Medicare Other

## 2019-06-19 LAB — CBC WITH DIFFERENTIAL/PLATELET
Absolute Monocytes: 616 cells/uL (ref 200–950)
Basophils Absolute: 28 cells/uL (ref 0–200)
Basophils Relative: 0.5 %
Eosinophils Absolute: 22 cells/uL (ref 15–500)
Eosinophils Relative: 0.4 %
HCT: 36.6 % — ABNORMAL LOW (ref 38.5–50.0)
Hemoglobin: 12.3 g/dL — ABNORMAL LOW (ref 13.2–17.1)
Lymphs Abs: 1672 cells/uL (ref 850–3900)
MCH: 26.8 pg — ABNORMAL LOW (ref 27.0–33.0)
MCHC: 33.6 g/dL (ref 32.0–36.0)
MCV: 79.7 fL — ABNORMAL LOW (ref 80.0–100.0)
MPV: 10.5 fL (ref 7.5–12.5)
Monocytes Relative: 11.2 %
Neutro Abs: 3163 cells/uL (ref 1500–7800)
Neutrophils Relative %: 57.5 %
Platelets: 313 10*3/uL (ref 140–400)
RBC: 4.59 10*6/uL (ref 4.20–5.80)
RDW: 16.9 % — ABNORMAL HIGH (ref 11.0–15.0)
Total Lymphocyte: 30.4 %
WBC: 5.5 10*3/uL (ref 3.8–10.8)

## 2019-06-19 LAB — COMPREHENSIVE METABOLIC PANEL
AG Ratio: 1.7 (calc) (ref 1.0–2.5)
ALT: 31 U/L (ref 9–46)
AST: 34 U/L (ref 10–35)
Albumin: 4.5 g/dL (ref 3.6–5.1)
Alkaline phosphatase (APISO): 47 U/L (ref 35–144)
BUN: 15 mg/dL (ref 7–25)
CO2: 24 mmol/L (ref 20–32)
Calcium: 9.6 mg/dL (ref 8.6–10.3)
Chloride: 101 mmol/L (ref 98–110)
Creat: 0.98 mg/dL (ref 0.70–1.33)
Globulin: 2.7 g/dL (calc) (ref 1.9–3.7)
Glucose, Bld: 76 mg/dL (ref 65–99)
Potassium: 3.8 mmol/L (ref 3.5–5.3)
Sodium: 135 mmol/L (ref 135–146)
Total Bilirubin: 0.6 mg/dL (ref 0.2–1.2)
Total Protein: 7.2 g/dL (ref 6.1–8.1)

## 2019-06-19 LAB — LIPID PANEL
Cholesterol: 152 mg/dL (ref <200)
HDL: 41 mg/dL (ref >=40)
LDL Cholesterol (Calc): 88 mg/dL (calc)
Non-HDL Cholesterol (Calc): 111 mg/dL (calc) (ref <130)
Total CHOL/HDL Ratio: 3.7 (calc) (ref <5.0)
Triglycerides: 124 mg/dL (ref <150)

## 2019-06-19 LAB — URINE CYTOLOGY ANCILLARY ONLY
Chlamydia: NEGATIVE
Comment: NEGATIVE
Comment: NORMAL
Neisseria Gonorrhea: NEGATIVE

## 2019-06-19 LAB — HIV-1 RNA QUANT-NO REFLEX-BLD
HIV 1 RNA Quant: <20 copies/mL
HIV-1 RNA Quant, Log: <1.3 Log copies/mL

## 2019-06-19 LAB — T-HELPER CELL (CD4) - (RCID CLINIC ONLY)
CD4 % Helper T Cell: 27 % — ABNORMAL LOW (ref 33–65)
CD4 T Cell Abs: 433 /uL (ref 400–1790)

## 2019-06-19 LAB — RPR: RPR Ser Ql: NONREACTIVE

## 2019-06-26 ENCOUNTER — Ambulatory Visit (INDEPENDENT_AMBULATORY_CARE_PROVIDER_SITE_OTHER): Payer: Medicare Other | Admitting: Podiatry

## 2019-06-26 ENCOUNTER — Other Ambulatory Visit: Payer: Self-pay

## 2019-06-26 VITALS — Temp 97.1°F

## 2019-06-26 DIAGNOSIS — M79676 Pain in unspecified toe(s): Secondary | ICD-10-CM | POA: Diagnosis not present

## 2019-06-26 DIAGNOSIS — B351 Tinea unguium: Secondary | ICD-10-CM

## 2019-06-26 MED ORDER — FLUCONAZOLE 150 MG PO TABS: 150.0000 mg | ORAL_TABLET | ORAL | 1 refills | Status: DC

## 2019-06-26 NOTE — Progress Notes (Signed)
  Subjective:  Patient ID: [name], male    DOB: [birth year],  MRN: [id number]  Chief Complaint  Patient presents with  . Nail Problem    Thick, long, discolored toenails - bilateral, 1-5. Pt stated, "I went [place] podiat[place] He said that I'd had nail fungus for a long time". No treatment. Requests nail trim.   59 y.o. male presents with the above complaint. History confirmed with patient.   Objective:  Physical Exam: warm, good capillary refill, nail exam onychomycosis of the toenails, no trophic changes or ulcerative lesions. DP pulses palpable, PT pulses palpable and protective sensation intact Left Foot: normal exam, no swelling, tenderness, instability; ligaments intact, full range of motion of all ankle/foot joints  Right Foot: normal exam, no swelling, tenderness, instability; ligaments intact, full range of motion of all ankle/foot joints   No images are attached to the encounter.  Assessment:   1. Pain due to onychomycosis of nail      Plan:  Patient was evaluated and treated and all questions answered.  Onychomycosis -Nails palliatively debrided secondary to pain  -Rx fluconazole weekly.  No follow-ups on file.

## 2019-07-03 ENCOUNTER — Encounter: Payer: Medicare Other | Admitting: Internal Medicine

## 2019-07-04 ENCOUNTER — Telehealth: Payer: Self-pay | Admitting: *Deleted

## 2019-07-04 NOTE — Telephone Encounter (Signed)
Patietn called to apologize for missing yesterday's appointment, asked for his lab results. He has rescheduled for 7/1. RN advised patient to keep his upcoming appointment, as it was important to be seen regularly for his safety and for continued medication refills.  He stated he will definitely be here July1. He has been released from alcohol rehab, is home and still sober. Results relayed. Andree Coss, RN

## 2019-07-08 ENCOUNTER — Ambulatory Visit: Payer: Medicare Other | Attending: Nurse Practitioner | Admitting: Nurse Practitioner

## 2019-07-08 ENCOUNTER — Other Ambulatory Visit: Payer: Self-pay

## 2019-07-10 ENCOUNTER — Ambulatory Visit: Payer: Medicare Other | Admitting: Internal Medicine

## 2019-07-22 ENCOUNTER — Ambulatory Visit: Payer: Medicare Other | Attending: Nurse Practitioner | Admitting: Nurse Practitioner

## 2019-07-22 ENCOUNTER — Other Ambulatory Visit: Payer: Self-pay

## 2019-07-29 ENCOUNTER — Ambulatory Visit: Payer: Medicare Other | Admitting: Internal Medicine

## 2019-08-25 ENCOUNTER — Ambulatory Visit: Payer: Self-pay | Admitting: *Deleted

## 2019-08-25 NOTE — Telephone Encounter (Signed)
Patient has a new patient appointment of Thursday, 08/28/19. Reports he had spinal surgery 3 years ago. Over the last year his numbness and tingling with right hip, knee and leg weakness has increased with his knee buckling causing him to fall often. Patient understands if his condition worsens he should seek treatment at the ED immediately. Cleared for appointment thru DT. Reason for Disposition . Weakness is a chronic symptom (recurrent or ongoing AND present > 4 weeks)  Answer Assessment - Initial Assessment Questions 1. MECHANISM: "How did the fall happen?"     When he stands he falls, knees buckle on right side.  2. DOMESTIC VIOLENCE AND ELDER ABUSE SCREENING: "Did you fall because someone pushed you or tried to hurt you?" If Yes, ask: "Are you safe now?"     no 3. ONSET: "When did the fall happen?" (e.g., minutes, hours, or days ago)     Often. 4. LOCATION: "What part of the body hit the ground?" (e.g., back, buttocks, head, hips, knees, hands, head, stomach)      5. INJURY: "Did you hurt (injure) yourself when you fell?" If Yes, ask: "What did you injure? Tell me more about this?" (e.g., body area; type of injury; pain severity)"     No.  6. PAIN: "Is there any pain?" If Yes, ask: "How bad is the pain?" (e.g., Scale 1-10; or mild,  moderate, severe)   - NONE (0): no pain   - MILD (1-3): doesn't interfere with normal activities    - MODERATE (4-7): interferes with normal activities or awakens from sleep    - SEVERE (8-10): excruciating pain, unable to do any normal activities      Pain is not from the falls. 7. SIZE: For cuts, bruises, or swelling, ask: "How large is it?" (e.g., inches or centimeters)      none 8. PREGNANCY: "Is there any chance you are pregnant?" "When was your last menstrual period?"     na 9. OTHER SYMPTOMS: "Do you have any other symptoms?" (e.g., dizziness, fever, weakness; new onset or worsening).      None of these 10. CAUSE: "What do you think caused the fall  (or falling)?" (e.g., tripped, dizzy spell)       Knee buckles  Protocols used: FALLS AND FALLING-A-AH

## 2019-08-28 ENCOUNTER — Encounter: Payer: Self-pay | Admitting: Family Medicine

## 2019-08-28 ENCOUNTER — Emergency Department (HOSPITAL_COMMUNITY): Payer: Medicare Other

## 2019-08-28 ENCOUNTER — Encounter (HOSPITAL_COMMUNITY): Payer: Self-pay

## 2019-08-28 ENCOUNTER — Other Ambulatory Visit: Payer: Self-pay

## 2019-08-28 ENCOUNTER — Ambulatory Visit (HOSPITAL_BASED_OUTPATIENT_CLINIC_OR_DEPARTMENT_OTHER): Payer: Medicare Other | Admitting: Family Medicine

## 2019-08-28 ENCOUNTER — Emergency Department (HOSPITAL_COMMUNITY)
Admission: EM | Admit: 2019-08-28 | Discharge: 2019-08-28 | Disposition: A | Discharge status: Home / Self Care | Payer: Medicare Other | Attending: Emergency Medicine | Admitting: Emergency Medicine

## 2019-08-28 VITALS — BP 132/83 | HR 78 | Ht 72.0 in | Wt 159.0 lb

## 2019-08-28 DIAGNOSIS — Z21 Asymptomatic human immunodeficiency virus [HIV] infection status: Secondary | ICD-10-CM | POA: Insufficient documentation

## 2019-08-28 DIAGNOSIS — M4800 Spinal stenosis, site unspecified: Secondary | ICD-10-CM

## 2019-08-28 DIAGNOSIS — M48062 Spinal stenosis, lumbar region with neurogenic claudication: Secondary | ICD-10-CM | POA: Insufficient documentation

## 2019-08-28 DIAGNOSIS — Z881 Allergy status to other antibiotic agents status: Secondary | ICD-10-CM | POA: Diagnosis not present

## 2019-08-28 DIAGNOSIS — G9520 Unspecified cord compression: Secondary | ICD-10-CM

## 2019-08-28 DIAGNOSIS — R159 Full incontinence of feces: Secondary | ICD-10-CM | POA: Insufficient documentation

## 2019-08-28 DIAGNOSIS — W19XXXA Unspecified fall, initial encounter: Secondary | ICD-10-CM

## 2019-08-28 DIAGNOSIS — Z882 Allergy status to sulfonamides status: Secondary | ICD-10-CM | POA: Diagnosis not present

## 2019-08-28 DIAGNOSIS — F17210 Nicotine dependence, cigarettes, uncomplicated: Secondary | ICD-10-CM | POA: Diagnosis not present

## 2019-08-28 DIAGNOSIS — Z79899 Other long term (current) drug therapy: Secondary | ICD-10-CM | POA: Diagnosis not present

## 2019-08-28 DIAGNOSIS — R296 Repeated falls: Secondary | ICD-10-CM | POA: Insufficient documentation

## 2019-08-28 DIAGNOSIS — Z7982 Long term (current) use of aspirin: Secondary | ICD-10-CM | POA: Diagnosis not present

## 2019-08-28 DIAGNOSIS — Z981 Arthrodesis status: Secondary | ICD-10-CM | POA: Insufficient documentation

## 2019-08-28 DIAGNOSIS — M549 Dorsalgia, unspecified: Secondary | ICD-10-CM | POA: Diagnosis present

## 2019-08-28 LAB — URINALYSIS, ROUTINE W REFLEX MICROSCOPIC
Bilirubin Urine: NEGATIVE
Glucose, UA: NEGATIVE mg/dL
Ketones, ur: NEGATIVE mg/dL
Leukocytes,Ua: NEGATIVE
Nitrite: NEGATIVE
Protein, ur: NEGATIVE mg/dL
Specific Gravity, Urine: 1.003 — ABNORMAL LOW (ref 1.005–1.030)
pH: 5 (ref 5.0–8.0)

## 2019-08-28 LAB — COMPREHENSIVE METABOLIC PANEL
ALT: 22 U/L (ref 0–44)
AST: 30 U/L (ref 15–41)
Albumin: 4.5 g/dL (ref 3.5–5.0)
Alkaline Phosphatase: 44 U/L (ref 38–126)
Anion gap: 9 (ref 5–15)
BUN: 12 mg/dL (ref 6–20)
CO2: 22 mmol/L (ref 22–32)
Calcium: 8.9 mg/dL (ref 8.9–10.3)
Chloride: 105 mmol/L (ref 98–111)
Creatinine, Ser: 0.82 mg/dL (ref 0.61–1.24)
GFR calc Af Amer: >60 mL/min (ref >60)
GFR calc non Af Amer: >60 mL/min (ref >60)
Glucose, Bld: 79 mg/dL (ref 70–99)
Potassium: 3.9 mmol/L (ref 3.5–5.1)
Sodium: 136 mmol/L (ref 135–145)
Total Bilirubin: 0.6 mg/dL (ref 0.3–1.2)
Total Protein: 7.7 g/dL (ref 6.5–8.1)

## 2019-08-28 LAB — CBC
HCT: 40.6 % (ref 39.0–52.0)
Hemoglobin: 13.3 g/dL (ref 13.0–17.0)
MCH: 28.5 pg (ref 26.0–34.0)
MCHC: 32.8 g/dL (ref 30.0–36.0)
MCV: 86.9 fL (ref 80.0–100.0)
Platelets: 268 10*3/uL (ref 150–400)
RBC: 4.67 MIL/uL (ref 4.22–5.81)
RDW: 14.8 % (ref 11.5–15.5)
WBC: 4.8 10*3/uL (ref 4.0–10.5)
nRBC: 0 % (ref 0.0–0.2)

## 2019-08-28 MED ORDER — SODIUM CHLORIDE 0.9 % IV SOLN: INTRAVENOUS | Status: DC

## 2019-08-28 MED ORDER — GADOBUTROL 1 MMOL/ML IV SOLN
7.0000 mL | Freq: Once | INTRAVENOUS | Status: AC | PRN
  Administered 2019-08-28: 7 mL via INTRAVENOUS

## 2019-08-28 MED ORDER — LORAZEPAM 2 MG/ML IJ SOLN
1.0000 mg | Freq: Once | INTRAMUSCULAR | Status: AC
  Administered 2019-08-28: 1 mg via INTRAVENOUS
  Filled 2019-08-28: qty 1

## 2019-08-28 NOTE — Progress Notes (Signed)
Having pain in back and knees.  States that he has been having frequent falls.  Having numbness and tingling on left side.

## 2019-08-28 NOTE — ED Notes (Signed)
   08/28/19 1600  What Happened  Was fall witnessed? No  Patients activity before fall ambulating-assisted  Point of contact other (comment) (face ands hands)  Was patient injured? No  Patient found on floor  Found by Staff-comment  Stated prior activity ambulating-unassisted  Follow Up  MD notified [name]  Time MD notified 1529  Additional tests No  Progress note created (see row info) Yes  Adult Fall Risk Assessment  Risk Factor Category (scoring not indicated) High fall risk per protocol (document High fall risk)  Patient Fall Risk Level High fall risk  Adult Fall Risk Interventions  Required Bundle Interventions *See Row Information* High fall risk - low, moderate, and high requirements implemented  Additional Interventions Room near nurses station  Vitals  Temp 98.2 F (36.8 C)  Temp Source Oral  BP 127/86  MAP (mmHg) 99  Pulse Rate 93  Resp 18  Oxygen Therapy  SpO2 97 %  Pain Assessment  Pain Scale Faces  Faces Pain Scale 8  Neurological  Neuro (WDL) WDL  Musculoskeletal  Musculoskeletal (WDL) WDL  Integumentary  Integumentary (WDL) WDL

## 2019-08-28 NOTE — ED Notes (Signed)
Brother coming to pick the patient up.

## 2019-08-28 NOTE — ED Provider Notes (Signed)
[facility]-EMERGENCY DEPT Provider Note   CSN: [id number] Arrival date & time: 08/28/19  1041     History Chief Complaint  Patient presents with  . Back Pain  . Encopresis    [name] is a 59 y.o. male.  59 year old male with history of HIV as well as cervical stenosis who had surgery for this about 2 years ago who presents with lower back pain, bowel incontinence.  Denies any urinary retention.  States that his symptoms have been for several months and have worsened to the point where he has trouble walking.  After his cervical spine surgery, patient did use a walker but now he is able to get around with a cane.  Denies any recent history of trauma.  Denies any neck discomfort.  Went to his physician today and was sent here for emergent MRI.  He denies any suprapubic pressure.  No perineal numbness or tingling.  Is unsure if he has foot drop.  Denies any new upper extremity weakness        Past Medical History:  Diagnosis Date  . 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 Hemorrhoids   . HIV (human immunodeficiency virus infection) (HCC)   . Stenosis of cervical spine    withb myelopathy    Patient Active Problem List   Diagnosis Date Noted  . MDD (major depressive disorder), recurrent episode (HCC) 04/20/2018  . Healthcare maintenance 02/04/2018  . Alcohol dependence with alcohol-induced mood disorder (HCC)   . Major depressive disorder, recurrent severe without psychotic features (HCC) 08/01/2017  . Dysphagia 07/08/2017  . Stenosis of cervical spine with myelopathy (HCC) 07/06/2017  . Left-sided weakness 06/27/2017  . Tricompartment osteoarthritis of right knee 03/27/2017  . Alcohol abuse with alcohol-induced mood disorder (HCC) 09/25/2016  . Polysubstance dependence (HCC) 09/25/2016  . Substance use disorder 09/19/2016  . 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 HIV disease  (HCC) 11/09/2008    Past Surgical History:  Procedure Laterality Date  . ANTERIOR CERVICAL DECOMP/DISCECTOMY FUSION N/A 07/06/2017   Procedure: ANTERIOR CERVICAL DECOMPRESSION/DISCECTOMY FUSION C3-4/C4-5 2 LEVELS;  Surgeon: Lisbeth Renshaw, MD;  Location: [facility] OR;  Service: Neurosurgery;  Laterality: N/A;  . HERNIA REPAIR  97   lt ing  . RECTAL EXAM UNDER ANESTHESIA N/A 04/24/2014   Procedure:  ligation of bleeding vessels;  Surgeon: Almond Lint, MD;  Location: [facility] ORS;  Service: General;  Laterality: N/A;  . TENDON REPAIR     2006-lt index  . WART FULGURATION  07/06/2011   Procedure: FULGURATION ANAL WART;  Surgeon: Shelly Rubenstein, MD;  Location: [facility];  Service: General;  Laterality: N/A;  excision anal condyloma  . WART FULGURATION N/A 04/23/2014   Procedure: EXCISION OF ANAL CONDYLOMA;  Surgeon: Abigail Miyamoto, MD;  Location: [facility] OR[facility];  Service: General;  Laterality: N/A;       Family History  Problem Relation Age of Onset  . Cancer Mother        brain  . Cancer Sister        breast    Social History   Tobacco Use  . Smoking status: Current Every Day Smoker    Packs/day: 0.50    Years: 25.00    Pack years: 12.50    Types: Cigarettes  . Smokeless tobacco: Never Used  Vaping Use  . Vaping Use: Never used  Substance Use Topics  . Alcohol use: Yes    Alcohol/week: 24.0  standard drinks    Types: 24 Cans of beer per week    Comment: ''I can drink a case a day''  . Drug use: Yes    Types: "Crack" cocaine    Comment: pt reports last use was Wednesday    Home Medications Prior to Admission medications   Medication Sig Start Date End Date Taking? Authorizing Provider  aspirin EC 81 MG tablet Take 81 mg by mouth daily.    [provider]  baclofen (LIORESAL) 10 MG tablet Take 20 mg by mouth 3 (three) times daily.  02/16/18   [provider]  buPROPion (WELLBUTRIN XL) 150 MG 24 hr tablet Take 1 tablet (150 mg total) by mouth daily.  08/04/17   Starkes-Perry, Juel Burrow, FNP  darunavir-cobicistat (PREZCOBIX) 800-150 MG tablet Take 1 tablet by mouth daily. Swallow whole. Do NOT crush, break or chew tablets. Take with food, take with Descovy. 06/12/19   [name], MD  emtricitabine-tenofovir AF (DESCOVY) 200-25 MG tablet Take 1 tablet by mouth daily. Take with Prezcobix. 06/12/19   [name], MD  fluconazole (DIFLUCAN) 150 MG tablet Take 1 tablet (150 mg total) by mouth once a week. Patient not taking: Reported on 08/28/2019 06/26/19   Park Liter, DPM  gabapentin (NEURONTIN) 300 MG capsule Take 1 capsule (300 mg total) by mouth 3 (three) times daily. Patient not taking: Reported on 08/28/2019 04/11/18   Malvin Johns, MD  hydrOXYzine (ATARAX/VISTARIL) 25 MG tablet Take 1 tablet (25 mg total) by mouth every 6 (six) hours as needed (anxiety/agitation or CIWA < or = 10). 08/03/17   Starkes-Perry, Juel Burrow, FNP  pantoprazole (PROTONIX) 40 MG tablet Take 40 mg by mouth daily.    [provider]  topiramate (TOPAMAX) 100 MG tablet Take 1 tablet (100 mg total) by mouth 3 (three) times daily. 04/11/18   Malvin Johns, MD    Allergies    Shellfish allergy, Sulfa antibiotics, Fish allergy, and Clindamycin  Review of Systems   Review of Systems  All other systems reviewed and are negative.   Physical Exam Updated Vital Signs BP (!) 168/93 (BP Location: Left Arm)   Pulse 74   Temp 98.1 F (36.7 C) (Oral)   Resp 18   Ht 1.829 m (6')   Wt 72.1 kg   SpO2 100%   BMI 21.56 kg/m   Physical Exam Vitals and nursing note reviewed.  Constitutional:      General: He is not in acute distress.    Appearance: Normal appearance. He is well-developed. He is not toxic-appearing.  HENT:     Head: Normocephalic and atraumatic.  Eyes:     General: Lids are normal.     Conjunctiva/sclera: Conjunctivae normal.     Pupils: Pupils are equal, round, and reactive to light.  Neck:     Thyroid: No thyroid mass.     Trachea: No  tracheal deviation.  Cardiovascular:     Rate and Rhythm: Normal rate and regular rhythm.     Heart sounds: Normal heart sounds. No murmur heard.  No gallop.   Pulmonary:     Effort: Pulmonary effort is normal. No respiratory distress.     Breath sounds: Normal breath sounds. No stridor. No decreased breath sounds, wheezing, rhonchi or rales.  Abdominal:     General: Bowel sounds are normal. There is no distension.     Palpations: Abdomen is soft.     Tenderness: There is no abdominal tenderness. There is no rebound.  Musculoskeletal:  General: No tenderness. Normal range of motion.     Cervical back: Normal range of motion and neck supple.  Skin:    General: Skin is warm and dry.     Findings: No abrasion or rash.  Neurological:     General: No focal deficit present.     Mental Status: He is alert and oriented to person, place, and time.     GCS: GCS eye subscore is 4. GCS verbal subscore is 5. GCS motor subscore is 6.     Cranial Nerves: No cranial nerve deficit.     Sensory: No sensory deficit.     Motor: Weakness present. No tremor or abnormal muscle tone.     Comments: Strength is 4 out of 5 in lower extremities, 5 of 5 in upper extremities  Psychiatric:        Speech: Speech normal.        Behavior: Behavior normal.     ED Results / Procedures / Treatments   Labs (all labs ordered are listed, but only abnormal results are displayed) Labs Reviewed  CBC  COMPREHENSIVE METABOLIC PANEL  URINALYSIS, ROUTINE W REFLEX MICROSCOPIC    EKG None  Radiology No results found.  Procedures Procedures (including critical care time)  Medications Ordered in ED Medications  0.9 %  sodium chloride infusion (has no administration in time range)  LORazepam (ATIVAN) injection 1 mg (has no administration in time range)    ED Course  I have reviewed the triage vital signs and the nursing notes.  Pertinent labs & imaging results that were available during my care of the  patient were reviewed by me and considered in my medical decision making (see chart for details).    MDM Rules/Calculators/A&P                         MRI of cervical and lumbar spine shows pre-existing cervical stenosis which is unchanged from prior.  Labs are reassuring here and without new abnormalities.  Have encouraged patient to follow-up with his neurosurgeon Final Clinical Impression(s) / ED Diagnoses Final diagnoses:  None    Rx / DC Orders ED Discharge Orders    None       Lorre Nick, MD 08/28/19 1523

## 2019-08-28 NOTE — ED Triage Notes (Addendum)
Pt sent from PCP office. Pt states that he has had bowel and urinary incontinence x 3 months. Pt states he has a hx of spinal stenosis, with tingling on hands and feet x 1.5 months. Pt's PCP sent him for a STAT MRI. Pt states he is having pain down his right leg.

## 2019-08-28 NOTE — ED Notes (Signed)
Pt was wheeled out to his family, pt stated that he did not have his earrings. Informed by staff that he was given his earrings in a specimen cup, and pt was the one to open cup. Could not find earrings in the bed.

## 2019-08-28 NOTE — ED Notes (Signed)
While coming back to computer station I witnessed pt walking down the hallway with an unsteady gait. Concerned over pts unsteady gait I asked him if he needed any assistance, wheel chair, or help walking. Pt denied needing any help and wished to continue to the bathroom. I observed pt getting into the bathroom before having to attend to another pt.

## 2019-08-28 NOTE — ED Notes (Addendum)
Pt noted to be trying to get out of bed.  RN advised pt to stay in bed. Pt says he's trapped (by the bedrail) and RN responded by telling him that is because he needs to lay back and stay in the bed.  Pt agreeable, however less than a minute later was found to be attempting to get up again. Pt redirected and it was restated that he needs to stay in the bed so he doesn't fall.

## 2019-08-28 NOTE — Patient Instructions (Signed)
Radicular Pain Radicular pain is a type of pain that spreads from your back or neck along a spinal nerve. Spinal nerves are nerves that leave the spinal cord and go to the muscles. Radicular pain is sometimes called radiculopathy, radiculitis, or a pinched nerve. When you have this type of pain, you may also have weakness, numbness, or tingling in the area of your body that is supplied by the nerve. The pain may feel sharp and burning. Depending on which spinal nerve is affected, the pain may occur in the:  Neck area (cervical radicular pain). You may also feel pain, numbness, weakness, or tingling in the arms.  Mid-spine area (thoracic radicular pain). You would feel this pain in the back and chest. This type is rare.  Lower back area (lumbar radicular pain). You would feel this pain as low back pain. You may feel pain, numbness, weakness, or tingling in the buttocks or legs. Sciatica is a type of lumbar radicular pain that shoots down the back of the leg. Radicular pain occurs when one of the spinal nerves becomes irritated or squeezed (compressed). It is often caused by something pushing on a spinal nerve, such as one of the bones of the spine (vertebrae) or one of the round cushions between vertebrae (intervertebral disks). This can result from:  An injury.  Wear and tear or aging of a disk.  The growth of a bone spur that pushes on the nerve. Radicular pain often goes away when you follow instructions from your health care provider for relieving pain at home. Follow these instructions at home: Managing pain      If directed, put ice on the affected area: ? Put ice in a plastic bag. ? Place a towel between your skin and the bag. ? Leave the ice on for 20 minutes, 2-3 times a day.  If directed, apply heat to the affected area as often as told by your health care provider. Use the heat source that your health care provider recommends, such as a moist heat pack or a heating pad. ? Place  a towel between your skin and the heat source. ? Leave the heat on for 20-30 minutes. ? Remove the heat if your skin turns bright red. This is especially important if you are unable to feel pain, heat, or cold. You may have a greater risk of getting burned. Activity   Do not sit or rest in bed for long periods of time.  Try to stay as active as possible. Ask your health care provider what type of exercise or activity is best for you.  Avoid activities that make your pain worse, such as bending and lifting.  Do not lift anything that is heavier than 10 lb (4.5 kg), or the limit that you are told, until your health care provider says that it is safe.  Practice using proper technique when lifting items. Proper lifting technique involves bending your knees and rising up.  Do strength and range-of-motion exercises only as told by your health care provider or physical therapist. General instructions  Take over-the-counter and prescription medicines only as told by your health care provider.  Pay attention to any changes in your symptoms.  Keep all follow-up visits as told by your health care provider. This is important. ? Your health care provider may send you to a physical therapist to help with this pain. Contact a health care provider if:  Your pain and other symptoms get worse.  Your pain medicine is not   helping.  Your pain has not improved after a few weeks of home care.  You have a fever. Get help right away if:  You have severe pain, weakness, or numbness.  You have difficulty with bladder or bowel control. Summary  Radicular pain is a type of pain that spreads from your back or neck along a spinal nerve.  When you have radicular pain, you may also have weakness, numbness, or tingling in the area of your body that is supplied by the nerve.  The pain may feel sharp or burning.  Radicular pain may be treated with ice, heat, medicines, or physical therapy. This  information is not intended to replace advice given to you by your health care provider. Make sure you discuss any questions you have with your health care provider. Document Revised: 07/10/2017 Document Reviewed: 07/10/2017 [employer]vier Patient Education  2020 [employer]  

## 2019-08-28 NOTE — Progress Notes (Signed)
Subjective:  Patient ID: [name], male    DOB: 12-Mar-1960  Age: 59 y.o. MRN: [id number]  CC: New Patient (Initial Visit)   HPI [name] is a 59 year old male with history of HIV, status post ACDF (C3-4, 4 5) with spinal stenosis of the cervical spine, severe lumbar spine stenosis who presents today with complaints below. He has severe low back pain which has progressed over the last 6 to 7 months with associated falling and frequency of falls have worsened in the last month with 2 falls yesterday.  He is now incontinent of feces.  Unable to drive as he now has to use his hands to move his lower extremities.  MRI lumbar spine from 03/2018 IMPRESSION: Motion degraded scan demonstrate chronic changes of congenital and acquired stenosis at L4-5 and L5-S1, with short pedicles, posterior element hypertrophy, disc protrusions, and osseous spurring. RIGHT-sided changes appear to be more severe at the L5-S1 level, although symptomatic neural impingement could occur at either L4-5 or L5-S1.  He has a Careers adviser Dr. Conchita Paris who he has not seen since his cervical spine surgery in 06/2017. His right knee also hurts and his symptoms make him depressed.  He informs me he needs to be admitted to the hospital so they can figure out what is going on.  He breaks down crying. Inquiring why he did not present sooner he states he thought symptoms would improve gradually after his cervical spine surgery. Past Medical History:  Diagnosis Date  . 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 Hemorrhoids   . HIV (human immunodeficiency virus infection) (HCC)   . Stenosis of cervical spine    withb myelopathy    Past Surgical History:  Procedure Laterality Date  . ANTERIOR CERVICAL DECOMP/DISCECTOMY FUSION N/A 07/06/2017   Procedure: ANTERIOR CERVICAL DECOMPRESSION/DISCECTOMY FUSION C3-4/C4-5 2 LEVELS;  Surgeon: Lisbeth Renshaw, MD;  Location: [facility];  Service: Neurosurgery;  Laterality: N/A;  . HERNIA REPAIR  97    lt ing  . RECTAL EXAM UNDER ANESTHESIA N/A 04/24/2014   Procedure:  ligation of bleeding vessels;  Surgeon: Almond Lint, MD;  Location: [facility];  Service: General;  Laterality: N/A;  . TENDON REPAIR     2006-lt index  . WART FULGURATION  07/06/2011   Procedure: FULGURATION ANAL WART;  Surgeon: Shelly Rubenstein, MD;  Location: [facility];  Service: General;  Laterality: N/A;  excision anal condyloma  . WART FULGURATION N/A 04/23/2014   Procedure: EXCISION OF ANAL CONDYLOMA;  Surgeon: Abigail Miyamoto, MD;  Location: [facility];  Service: General;  Laterality: N/A;    Family History  Problem Relation Age of Onset  . Cancer Mother        brain  . Cancer Sister        breast    Allergies  Allergen Reactions  . Shellfish Allergy Anaphylaxis and Swelling    Swelling everywhere  . Sulfa Antibiotics Anaphylaxis, Hives, Itching and Swelling    Swelling all over   . Fish Allergy Swelling  . Clindamycin Diarrhea    Outpatient Medications Prior to Visit  Medication Sig Dispense Refill  . hydrOXYzine (ATARAX/VISTARIL) 25 MG tablet Take 1 tablet (25 mg total) by mouth every 6 (six) hours as needed (anxiety/agitation or CIWA < or = 10). 30 tablet 0  . pantoprazole (PROTONIX) 40 MG tablet Take 40 mg by mouth daily.    Marland Kitchen topiramate (TOPAMAX) 100 MG tablet Take 1 tablet (100 mg total) by mouth 3 (three) times daily. 90 tablet  2  . aspirin EC 81 MG tablet Take 81 mg by mouth daily.    . baclofen (LIORESAL) 10 MG tablet Take 20 mg by mouth 3 (three) times daily.     Marland Kitchen buPROPion (WELLBUTRIN XL) 150 MG 24 hr tablet Take 1 tablet (150 mg total) by mouth daily. 30 tablet 0  . darunavir-cobicistat (PREZCOBIX) 800-150 MG tablet Take 1 tablet by mouth daily. Swallow whole. Do NOT crush, break or chew tablets. Take with food, take with Descovy. 30 tablet 0  . emtricitabine-tenofovir AF (DESCOVY) 200-25 MG tablet Take 1 tablet by mouth daily. Take with Prezcobix. 30 tablet 0  . fluconazole  (DIFLUCAN) 150 MG tablet Take 1 tablet (150 mg total) by mouth once a week. (Patient not taking: Reported on 08/28/2019) 6 tablet 1  . gabapentin (NEURONTIN) 300 MG capsule Take 1 capsule (300 mg total) by mouth 3 (three) times daily. (Patient not taking: Reported on 08/28/2019) 90 capsule 2   No facility-administered medications prior to visit.     ROS Review of Systems  Constitutional: Negative for activity change and appetite change.  HENT: Negative for sinus pressure and sore throat.   Eyes: Negative for visual disturbance.  Respiratory: Negative for cough, chest tightness and shortness of breath.   Cardiovascular: Negative for chest pain and leg swelling.  Gastrointestinal: Negative for abdominal distention, abdominal pain, constipation and diarrhea.  Endocrine: Negative.   Genitourinary: Negative for dysuria.  Musculoskeletal: Positive for arthralgias, back pain and gait problem. Negative for joint swelling and myalgias.  Skin: Negative for rash.  Allergic/Immunologic: Negative.   Neurological: Positive for weakness. Negative for light-headedness and numbness.  Psychiatric/Behavioral: Positive for dysphoric mood. Negative for suicidal ideas.    Objective:  BP 132/83   Pulse 78   Ht 6' (1.829 m)   Wt 159 lb (72.1 kg)   SpO2 100%   BMI 21.56 kg/m   BP/Weight 08/28/2019 04/20/2018 04/19/2018  Systolic BP 132 113 -  Diastolic BP 83 76 -  Wt. (Lbs) 159 - 166  BMI 21.56 - 22.51  Some encounter information is confidential and restricted. Go to Review Flowsheets activity to see all data.      Physical Exam Constitutional:      Appearance: He is well-developed.  Neck:     Vascular: No JVD.  Cardiovascular:     Rate and Rhythm: Normal rate.     Heart sounds: Normal heart sounds. No murmur heard.   Pulmonary:     Effort: Pulmonary effort is normal.     Breath sounds: Normal breath sounds. No wheezing or rales.  Chest:     Chest wall: No tenderness.  Abdominal:      General: Bowel sounds are normal. There is no distension.     Palpations: Abdomen is soft. There is no mass.     Tenderness: There is no abdominal tenderness.  Musculoskeletal:     Right lower leg: No edema.     Left lower leg: No edema.     Comments: Tenderness on palpation of lumbar spine Unable to lie supine on exam table Unable to attain passive range of motion in lower extremities only active range of motion  Neurological:     Mental Status: He is alert and oriented to person, place, and time.     Motor: Weakness present.     Gait: Gait abnormal.     Comments: Strength in bilateral lower extremities-2+/5  Psychiatric:        Mood and Affect: Mood  normal.     CMP Latest Ref Rng & Units 06/18/2019 04/18/2018 04/07/2018  Glucose 65 - 99 mg/dL 76 82 98  BUN 7 - 25 mg/dL 15 11 19   Creatinine 0.70 - 1.33 mg/dL 0.26 3.78  Sodium 135 - 146 mmol/L 135 138 137  Potassium 3.5 - 5.3 mmol/L 3.8 3.7 3.3(L)  Chloride 98 - 110 mmol/L 101 107 103  CO2 20 - 32 mmol/L 24 19(L) 23  Calcium 8.6 - 10.3 mg/dL 9.6 9.7 9.3  Total Protein 6.1 - 8.1 g/dL 7.2 7.1 7.8  Total Bilirubin 0.2 - 1.2 mg/dL 0.6 0.5 0.9  Alkaline Phos 38 - 126 U/L - 52 46  AST 10 - 35 U/L 34 31 24  ALT 9 - 46 U/L 31 27 18     Lipid Panel     Component Value Date/Time   CHOL 152 06/18/2019 1344   TRIG 124 06/18/2019 1344   HDL 41 06/18/2019 1344   CHOLHDL 3.7 06/18/2019 1344   VLDL 31 04/21/2018 0627   LDLCALC 88 06/18/2019 1344    CBC    Component Value Date/Time   WBC 5.5 06/18/2019 1344   RBC 4.59 06/18/2019 1344   HGB 12.3 (L) 06/18/2019 1344   HCT 36.6 (L) 06/18/2019 1344   PLT 313 06/18/2019 1344   MCV 79.7 (L) 06/18/2019 1344   MCH 26.8 (L) 06/18/2019 1344   MCHC 33.6 06/18/2019 1344   RDW 16.9 (H) 06/18/2019 1344   LYMPHSABS 1,672 06/18/2019 1344   MONOABS 0.5 04/18/2018 2110   EOSABS 22 06/18/2019 1344   BASOSABS 28 06/18/2019 1344    Lab Results  Component Value Date   HGBA1C 5.3 04/21/2018      Assessment & Plan:  1. Spinal stenosis of lumbar region with neurogenic claudication Concern for impending cord compression Would love to repeat MRI however given urgency have referred him to the emergency room He needs to be seen by neurosurgery ASAP - Ambulatory referral to Neurosurgery  2. Cord compression (HCC) See #1 above  3. Full incontinence of feces I am concerned about cord compression given his rapid deterioration See #1 above  4. Fall, initial encounter Secondary to #1 above    No orders of the defined types were placed in this encounter.   Follow-up: Return in about 2 weeks (around 09/11/2019) for coordination of care.       06/21/2018, MD, FAAFP. [facility] [place], [facility] [place]   08/28/2019, 10:12 AM

## 2019-08-28 NOTE — ED Notes (Signed)
Patient in the wall bed. Patient told multiple times not to get up. Patient continues to try to get up.

## 2019-09-19 ENCOUNTER — Other Ambulatory Visit: Payer: Self-pay | Admitting: Internal Medicine

## 2019-09-19 ENCOUNTER — Telehealth: Payer: Self-pay

## 2019-09-19 DIAGNOSIS — B20 Human immunodeficiency virus [HIV] disease: Secondary | ICD-10-CM

## 2019-09-19 NOTE — Telephone Encounter (Signed)
COVID-19 Pre-Screening Questions:09/19/19 ° °Do you currently have a fever (>100 °F), chills or unexplained body aches? NO ° °Are you currently experiencing new cough, shortness of breath, sore throat, runny nose?NO °•  °Have you been in contact with someone that is currently pending confirmation of Covid19 testing or has been confirmed to have the Covid19 virus?  NO ° °I have advised patient to call back to reschedule or convert to a MyChart visit if any Covid symptoms appear during the weekend. ° °**If the patient answers NO to ALL questions -  advise the patient to please call the clinic before coming to the office should any symptoms develop.  ° ° °

## 2019-09-22 ENCOUNTER — Other Ambulatory Visit: Payer: Self-pay

## 2019-09-22 ENCOUNTER — Ambulatory Visit (INDEPENDENT_AMBULATORY_CARE_PROVIDER_SITE_OTHER): Payer: Medicare Other | Admitting: Internal Medicine

## 2019-09-22 ENCOUNTER — Encounter: Payer: Self-pay | Admitting: Internal Medicine

## 2019-09-22 VITALS — BP 129/86 | HR 85 | Temp 98.1°F | Ht 72.0 in | Wt 169.0 lb

## 2019-09-22 DIAGNOSIS — B20 Human immunodeficiency virus [HIV] disease: Secondary | ICD-10-CM | POA: Diagnosis present

## 2019-09-22 DIAGNOSIS — F1024 Alcohol dependence with alcohol-induced mood disorder: Secondary | ICD-10-CM | POA: Diagnosis not present

## 2019-09-22 DIAGNOSIS — Z0000 Encounter for general adult medical examination without abnormal findings: Secondary | ICD-10-CM

## 2019-09-22 DIAGNOSIS — Z23 Encounter for immunization: Secondary | ICD-10-CM

## 2019-09-22 DIAGNOSIS — M48061 Spinal stenosis, lumbar region without neurogenic claudication: Secondary | ICD-10-CM | POA: Diagnosis not present

## 2019-09-22 MED ORDER — SYMTUZA 800-150-200-10 MG PO TABS: 1.0000 | ORAL_TABLET | Freq: Every day | ORAL | 11 refills | Status: DC

## 2019-09-22 NOTE — Progress Notes (Signed)
Rfv: follow up for hiv disease  Patient ID: [name], male   DOB: [birth year], 59 y.o.   MRN: [id number]  HPI [name] is 59yo M with hiv disease, CD 4 count of 433/VL<20 (June 2021) on prezcobix-descovy  Has been in alcohol detox a few times but relapsed Drinks everyday 18 - 24 pack.  Has been having fecal incontinence ? Occasionally and difficulty with ambulation and went to the hospital for evaluation . He was found to have spinal stenosis - no cord compression by  MRI. He has upcoming NSGY appt this week.  Outpatient Encounter Medications as of 09/22/2019  Medication Sig  . aspirin EC 81 MG tablet Take 81 mg by mouth daily.  . baclofen (LIORESAL) 20 MG tablet Take 20 mg by mouth 3 (three) times daily.  . darunavir-cobicistat (PREZCOBIX) 800-150 MG tablet Take 1 tablet by mouth daily. Swallow whole. Do NOT crush, break or chew tablets. Take with food, take with Descovy.  Marland Kitchen emtricitabine-tenofovir AF (DESCOVY) 200-25 MG tablet Take 1 tablet by mouth daily. Take with Prezcobix.  . pantoprazole (PROTONIX) 40 MG tablet Take 40 mg by mouth daily.  . baclofen (LIORESAL) 10 MG tablet Take 20 mg by mouth 3 (three) times daily.  (Patient not taking: Reported on 09/22/2019)  . buPROPion (WELLBUTRIN XL) 300 MG 24 hr tablet  (Patient not taking: Reported on 09/22/2019)  . fluconazole (DIFLUCAN) 150 MG tablet Take 1 tablet (150 mg total) by mouth once a week. (Patient not taking: Reported on 08/28/2019)  . gabapentin (NEURONTIN) 300 MG capsule Take 1 capsule (300 mg total) by mouth 3 (three) times daily. (Patient not taking: Reported on 08/28/2019)  . hydrOXYzine (ATARAX/VISTARIL) 25 MG tablet Take 1 tablet (25 mg total) by mouth every 6 (six) hours as needed (anxiety/agitation or CIWA < or = 10). (Patient not taking: Reported on 09/22/2019)  . topiramate (TOPAMAX) 100 MG tablet Take 1 tablet (100 mg total) by mouth 3 (three) times daily. (Patient not taking: Reported on 09/22/2019)  . [DISCONTINUED]  buPROPion (WELLBUTRIN XL) 150 MG 24 hr tablet Take 1 tablet (150 mg total) by mouth daily. (Patient not taking: Reported on 09/22/2019)   No facility-administered encounter medications on file as of 09/22/2019.     Patient Active Problem List   Diagnosis Date Noted  . MDD (major depressive disorder), recurrent episode (HCC) 04/20/2018  . Healthcare maintenance 02/04/2018  . Alcohol dependence with alcohol-induced mood disorder (HCC)   . Major depressive disorder, recurrent severe without psychotic features (HCC) 08/01/2017  . Dysphagia 07/08/2017  . Stenosis of cervical spine with myelopathy (HCC) 07/06/2017  . Left-sided weakness 06/27/2017  . Tricompartment osteoarthritis of right knee 03/27/2017  . Alcohol abuse with alcohol-induced mood disorder (HCC) 09/25/2016  . Polysubstance dependence (HCC) 09/25/2016  . Substance use disorder 09/19/2016  . 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 HIV disease (HCC) 11/09/2008     Health Maintenance Due  Topic Date Due  . Hepatitis C Screening  Never done  . COVID-19 Vaccine (1) Never done  . TETANUS/TDAP  Never done  . COLONOSCOPY  Never done  . INFLUENZA VACCINE  08/10/2019    Soc hx: drinks ETOH 18-24 pack.  Review of Systems 12 point ros is negative except what is mentioned above Physical Exam   BP 129/86   Pulse 85   Temp 98.1 F (36.7 C) (Oral)   Ht 6' (1.829 m)   Wt 169 lb (76.7  kg)   SpO2 96%   BMI 22.92 kg/m   Physical Exam  Constitutional: He is oriented to person, place, and time. He appears well-developed and well-nourished. No distress.  HENT:  Mouth/Throat: Oropharynx is clear and moist. No oropharyngeal exudate.  Cardiovascular: Normal rate, regular rhythm and normal heart sounds. Exam reveals no gallop and no friction rub.  No murmur heard.  Pulmonary/Chest: Effort normal and breath sounds normal. No respiratory distress. He has no  wheezes.  Abdominal: Soft. Bowel sounds are normal. He exhibits no distension. There is no tenderness.  Lymphadenopathy:  He has no cervical adenopathy.  Neurological: He is alert and oriented to person, place, and time.  Skin: Skin is warm and dry. No rash noted. No erythema.  Psychiatric: He has a normal mood and affect. His behavior is normal.    Lab Results  Component Value Date   CD4TCELL 27 (L) 06/18/2019   Lab Results  Component Value Date   CD4TABS 433 06/18/2019   CD4TABS 340 (L) 02/04/2018   CD4TABS 310 (L) 07/16/2017   Lab Results  Component Value Date   HIV1RNAQUANT <20 NOT DETECTED 06/18/2019   No results found for: HEPBSAB Lab Results  Component Value Date   LABRPR NON-REACTIVE 06/18/2019    CBC Lab Results  Component Value Date   WBC 4.8 08/28/2019   RBC 4.67 08/28/2019   HGB 13.3 08/28/2019   HCT 40.6 08/28/2019   PLT 268 08/28/2019   MCV 86.9 08/28/2019   MCH 28.5 08/28/2019   MCHC 32.8 08/28/2019   RDW 14.8 08/28/2019   LYMPHSABS 1,672 06/18/2019   MONOABS 0.5 04/18/2018   EOSABS 22 06/18/2019    BMET Lab Results  Component Value Date   NA 136 08/28/2019   K 3.9 08/28/2019   CL 105 08/28/2019   CO2 22 08/28/2019   GLUCOSE 79 08/28/2019   BUN 12 08/28/2019   CREATININE 0.82 08/28/2019   CALCIUM 8.9 08/28/2019   GFRNONAA >60 08/28/2019   GFRAA >60 08/28/2019      Assessment and Plan hiv disease=  Will do labs. Suspect still well controlled. Lets change to symtuza to simplify regimen  Health maintenance = flu shot today  Alcohol dependence = precontemplative. Trying to decrease his intake  Spinal stenosis = Sees dr Conchita Paris tomorrow for evaluation for spinal stenosis

## 2019-09-23 LAB — T-HELPER CELL (CD4) - (RCID CLINIC ONLY)
CD4 % Helper T Cell: 31 % — ABNORMAL LOW (ref 33–65)
CD4 T Cell Abs: 515 /uL (ref 400–1790)

## 2019-09-24 LAB — CBC WITH DIFFERENTIAL/PLATELET
Absolute Monocytes: 585 cells/uL (ref 200–950)
Basophils Absolute: 9 cells/uL (ref 0–200)
Basophils Relative: 0.2 %
Eosinophils Absolute: 9 cells/uL — ABNORMAL LOW (ref 15–500)
Eosinophils Relative: 0.2 %
HCT: 40 % (ref 38.5–50.0)
Hemoglobin: 13.6 g/dL (ref 13.2–17.1)
Lymphs Abs: 1935 cells/uL (ref 850–3900)
MCH: 27.8 pg (ref 27.0–33.0)
MCHC: 34 g/dL (ref 32.0–36.0)
MCV: 81.6 fL (ref 80.0–100.0)
MPV: 10.8 fL (ref 7.5–12.5)
Monocytes Relative: 13 %
Neutro Abs: 1962 cells/uL (ref 1500–7800)
Neutrophils Relative %: 43.6 %
Platelets: 266 10*3/uL (ref 140–400)
RBC: 4.9 10*6/uL (ref 4.20–5.80)
RDW: 14.6 % (ref 11.0–15.0)
Total Lymphocyte: 43 %
WBC: 4.5 10*3/uL (ref 3.8–10.8)

## 2019-09-24 LAB — COMPLETE METABOLIC PANEL WITH GFR
AG Ratio: 1.6 (calc) (ref 1.0–2.5)
ALT: 23 U/L (ref 9–46)
AST: 35 U/L (ref 10–35)
Albumin: 4.8 g/dL (ref 3.6–5.1)
Alkaline phosphatase (APISO): 56 U/L (ref 35–144)
BUN: 9 mg/dL (ref 7–25)
CO2: 26 mmol/L (ref 20–32)
Calcium: 9.5 mg/dL (ref 8.6–10.3)
Chloride: 97 mmol/L — ABNORMAL LOW (ref 98–110)
Creat: 0.97 mg/dL (ref 0.70–1.33)
GFR, Est African American: 99 mL/min/{1.73_m2} (ref >=60)
GFR, Est Non African American: 86 mL/min/{1.73_m2} (ref >=60)
Globulin: 3 g/dL (calc) (ref 1.9–3.7)
Glucose, Bld: 69 mg/dL (ref 65–99)
Potassium: 4.1 mmol/L (ref 3.5–5.3)
Sodium: 133 mmol/L — ABNORMAL LOW (ref 135–146)
Total Bilirubin: 0.4 mg/dL (ref 0.2–1.2)
Total Protein: 7.8 g/dL (ref 6.1–8.1)

## 2019-09-24 LAB — HIV-1 RNA QUANT-NO REFLEX-BLD
HIV 1 RNA Quant: <20 Copies/mL — ABNORMAL HIGH
HIV-1 RNA Quant, Log: <1.3 Log cps/mL — ABNORMAL HIGH

## 2019-09-24 LAB — RPR: RPR Ser Ql: NONREACTIVE

## 2019-09-26 ENCOUNTER — Ambulatory Visit (INDEPENDENT_AMBULATORY_CARE_PROVIDER_SITE_OTHER): Payer: Medicare Other | Admitting: Podiatry

## 2019-09-26 ENCOUNTER — Ambulatory Visit: Payer: Medicare Other | Admitting: Podiatry

## 2019-09-26 ENCOUNTER — Other Ambulatory Visit: Payer: Self-pay

## 2019-09-26 DIAGNOSIS — M79609 Pain in unspecified limb: Secondary | ICD-10-CM

## 2019-09-26 DIAGNOSIS — B351 Tinea unguium: Secondary | ICD-10-CM

## 2019-09-26 NOTE — Progress Notes (Signed)
  Subjective:  Patient ID: [name], male    DOB: Jun 22, 1960,  MRN: [id number]  Chief Complaint  Patient presents with  . Nail Problem    15month nail routine- nail trim    59 y.o. male presents with the above complaint. History confirmed with patient.   Objective:  Physical Exam: warm, good capillary refill, nail exam onychomycosis of the toenails, no trophic changes or ulcerative lesions. DP pulses palpable, PT pulses palpable and protective sensation intact Left Foot: normal exam, no swelling, tenderness, instability; ligaments intact, full range of motion of all ankle/foot joints  Right Foot: normal exam, no swelling, tenderness, instability; ligaments intact, full range of motion of all ankle/foot joints   No images are attached to the encounter.  Assessment:   1. Pain due to onychomycosis of nail      Plan:  Patient was evaluated and treated and all questions answered.  Onychomycosis  -Nails similar in appearance. -Debrided courtesy to lessen fungal burden. Continue medication to completion. -F/u PRN  No follow-ups on file.

## 2019-10-07 ENCOUNTER — Ambulatory Visit: Payer: Medicare Other | Attending: Nurse Practitioner | Admitting: Nurse Practitioner

## 2019-12-22 ENCOUNTER — Telehealth (INDEPENDENT_AMBULATORY_CARE_PROVIDER_SITE_OTHER): Payer: Medicare Other | Admitting: Internal Medicine

## 2019-12-22 ENCOUNTER — Other Ambulatory Visit: Payer: Self-pay

## 2019-12-22 DIAGNOSIS — Z0000 Encounter for general adult medical examination without abnormal findings: Secondary | ICD-10-CM | POA: Diagnosis not present

## 2019-12-22 DIAGNOSIS — M1611 Unilateral primary osteoarthritis, right hip: Secondary | ICD-10-CM | POA: Diagnosis not present

## 2019-12-22 DIAGNOSIS — B20 Human immunodeficiency virus [HIV] disease: Secondary | ICD-10-CM | POA: Diagnosis not present

## 2019-12-22 NOTE — Progress Notes (Signed)
Virtual Visit via Telephone Note  I connected with [name] on 12/22/19 at 10:30 AM EST by telephone and verified that I am speaking with the correct person using two identifiers.  Location: Patient: in [place], visiting family. Provider: in clinic   I discussed the limitations, risks, security and privacy concerns of performing an evaluation and management service by telephone and the availability of in person appointments. I also discussed with the patient that there may be a patient responsible charge related to this service. The patient expressed understanding and agreed to proceed.   History of Present Illness: 59yo M with well controlled HIV disease, CD 4 count of 515/VL<20 ( sep 2021) on symtuza, not missing doses. Has not had covid vaccine as of yet, but did receive flu vaccine for this year.  Right hip pain still on going now radiating to back unclear if it is worsening osteoarthritis, has mentioned to pcp but not had any imaging. He is coming back to town in february  Current Meds  Medication Sig  . aspirin EC 81 MG tablet Take 81 mg by mouth daily.  . baclofen (LIORESAL) 20 MG tablet Take 20 mg by mouth 3 (three) times daily.  Marland Kitchen buPROPion (WELLBUTRIN XL) 300 MG 24 hr tablet   . Darunavir-Cobicisctat-Emtricitabine-Tenofovir Alafenamide (SYMTUZA) 800-150-200-10 MG TABS Take 1 tablet by mouth daily with breakfast.  . gabapentin (NEURONTIN) 300 MG capsule Take 1 capsule (300 mg total) by mouth 3 (three) times daily.  . hydrOXYzine (ATARAX/VISTARIL) 25 MG tablet Take 1 tablet (25 mg total) by mouth every 6 (six) hours as needed (anxiety/agitation or CIWA < or = 10).  . pantoprazole (PROTONIX) 40 MG tablet Take 40 mg by mouth daily.     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 Once back into town will get plan films, +/- MRI hiv disease= will continue on symtuza Health maintenance =  spoke 10 minutes about the importance of covid vaccine, side effects, and risks for covid illness vs SE of vaccine. He is interested to pursuing vaccine in [place] to start series. 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 The patient was provided an opportunity to ask questions and all were answered. The patient agreed with the plan and demonstrated an understanding of the instructions.   The patient was advised to call back or seek an in-person evaluation if the symptoms worsen or if the condition fails to improve as anticipated.  I provided 10 minutes of non-face-to-face time during this encounter.   [name], MD

## 2019-12-26 ENCOUNTER — Ambulatory Visit: Payer: Medicare Other | Admitting: Podiatry

## 2020-01-01 IMAGING — MR MR CERVICAL SPINE W/O CM
4 of 5 series · 16 of 48 positions shown · non-contrast
Comparison: Prior CT from earlier the same day.

CLINICAL DATA: Initial evaluation for 5 day history of left-sided
numbness.

EXAM:
MRI HEAD WITHOUT CONTRAST
MRI CERVICAL SPINE WITHOUT CONTRAST
TECHNIQUE: Multiplanar, multiecho pulse sequences of the brain and surrounding
structures, and cervical spine, to include the craniocervical
junction and cervicothoracic junction, were obtained without
intravenous contrast.

[Series 2: T1 · sagittal · 3.0mm · 0.41mm/px · 3 of 12 slices shown]
[im 1/12]
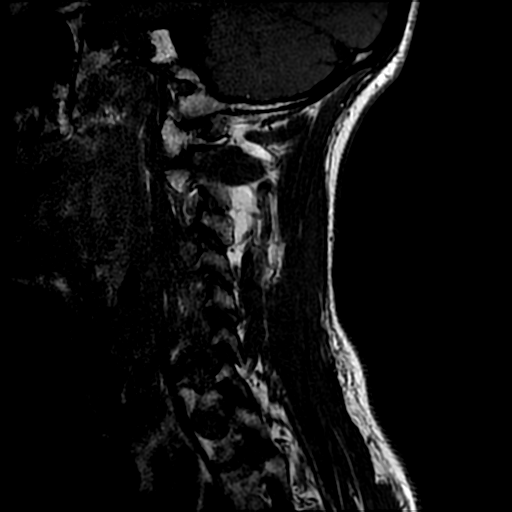
[im 6/12]
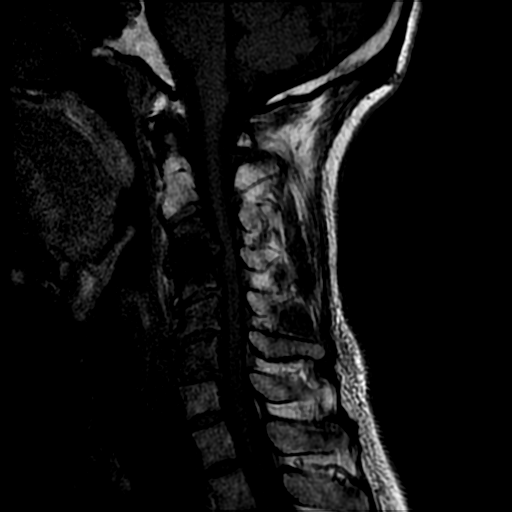
[im 12/12]
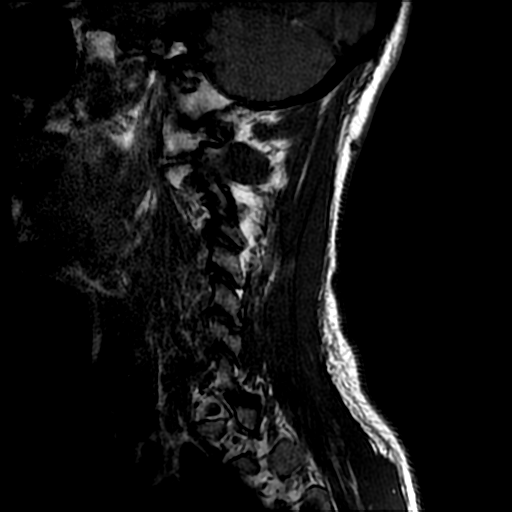

[Series 3: T2 · sagittal · 3.0mm · 0.41mm/px · 5 of 12 slices shown (1 of 2)]
[im 1/12]
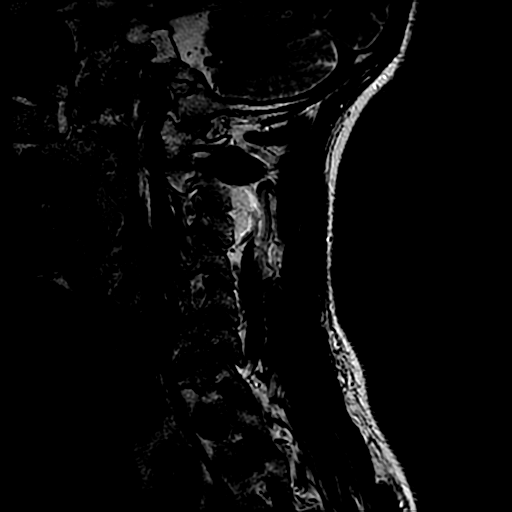
[im 3/12]
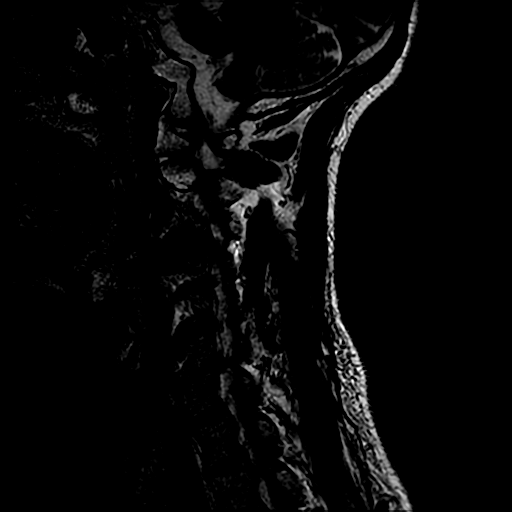
[im 6/12]
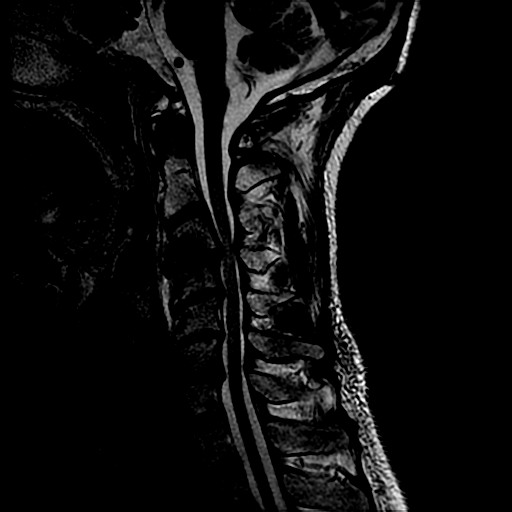
[im 9/12]
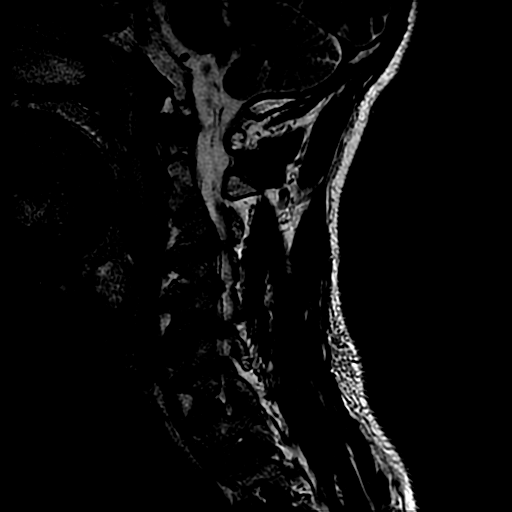
[im 12/12]
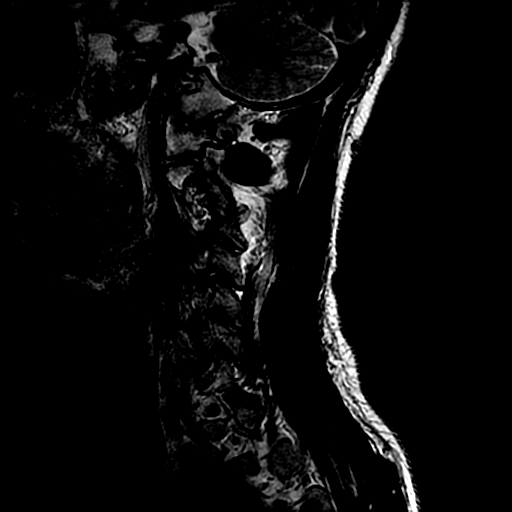

[Series 4: sag ir · sagittal · 3.0mm · 0.41mm/px · 3 of 12 slices shown]
[im 3/12]
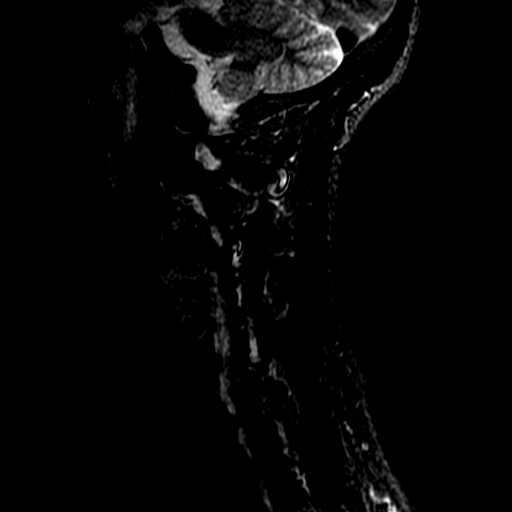
[im 7/12]
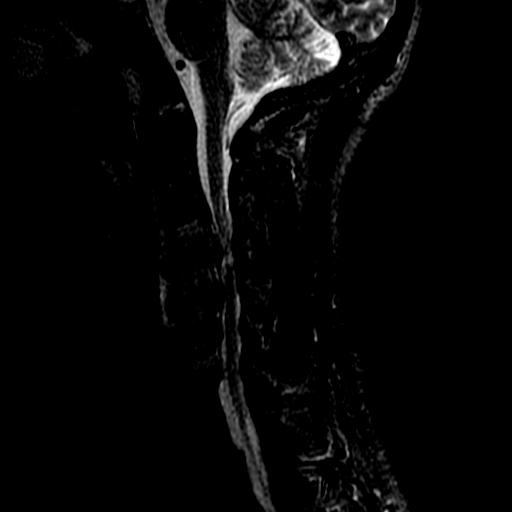
[im 12/12]
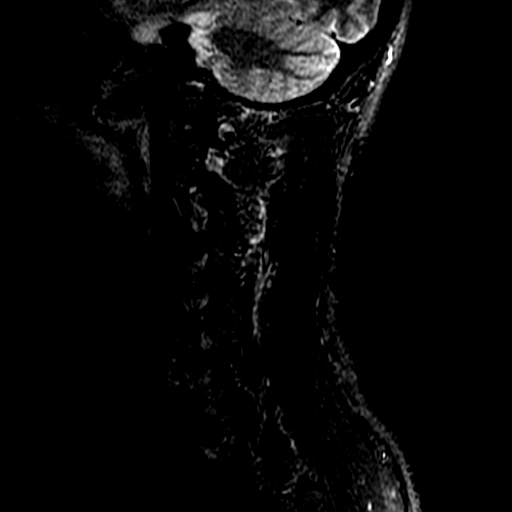

[Series 6: T2 · axial · 3.1mm · 0.35mm/px · z∈[-77,+8]mm · 5 of 33 slices shown (2 of 2)]
[im 3/33]
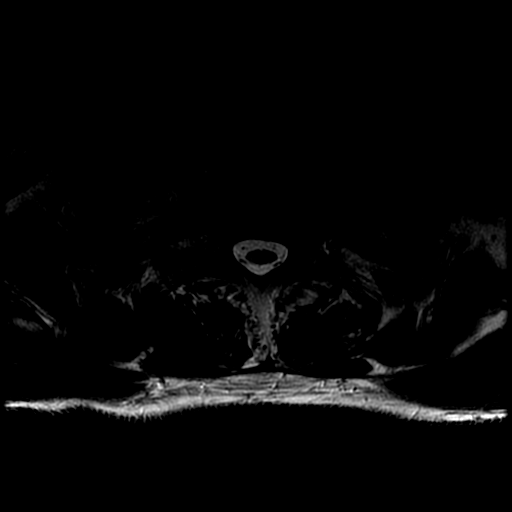
[im 5/33]
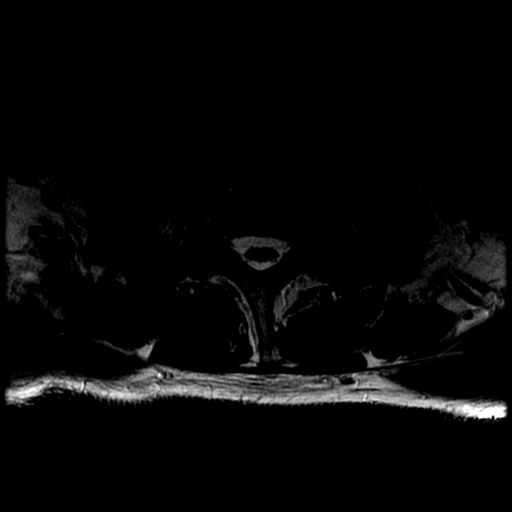
[im 7/33]
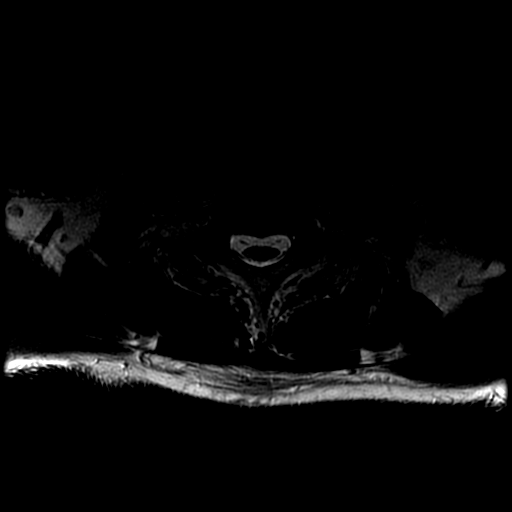
[im 18/33]
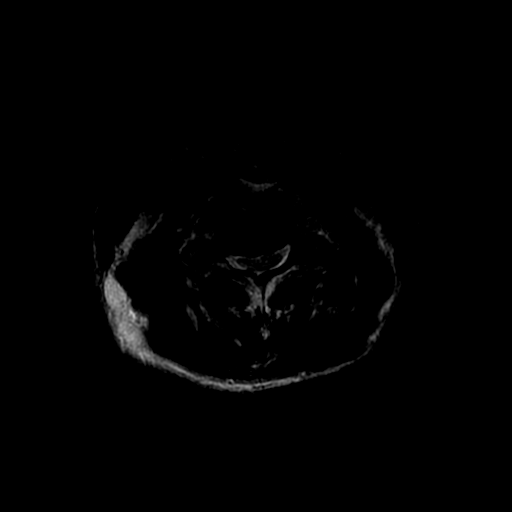
[im 28/33]
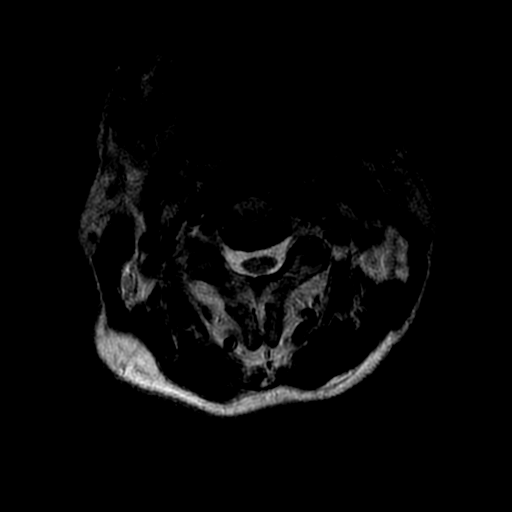

[16 of 48 positions shown; findings below may reference images not displayed]

FINDINGS: MRI HEAD FINDINGS

Brain: Generalized age-related cerebral atrophy. Few scattered mild
noted within the periventricular and deep white matter both cerebral
hemispheres, nonspecific, but felt to be within normal limits for
age. No evidence for acute or subacute ischemia. Gray-white matter
differentiation maintained. No evidence for cortical infarction. No
evidence for acute or chronic intracranial hemorrhage.

No mass lesion, midline shift or mass effect. No hydrocephalus. No
extra-axial fluid collection. Pituitary gland within normal limits.

Vascular: Major intracranial vascular flow voids are maintained.
Hypoplastic left vertebral artery not visualized.

Skull and upper cervical spine: Craniocervical junction within
normal limits. Bone marrow signal intensity normal. No scalp soft
tissue abnormality.

Sinuses/Orbits: Globes and orbital soft tissues within normal
limits. Paranasal sinuses are largely clear. No mastoid effusion.

Other: None.

MRI CERVICAL SPINE FINDINGS

Alignment: Straightening with reversal of the normal cervical
lordosis, apex at C3-4. Trace chronic facet mediated retrolisthesis
of C3 on C4, C5 on C6, C6 on C7.

Vertebrae: Vertebral body heights maintained without evidence for
acute or chronic fracture. Bone marrow signal intensity within
normal limits. No discrete or worrisome osseous lesions.
Heterogeneous signal intensity within the C3 through C6 vertebral
bodies consistent with degenerative changes.

Cord: Abnormal T2 signal intensity within the bilateral cervical
spinal cord at the level of C3-4, consistent with myelomalacia
(series 3, image 8). Changes slightly more prominent on the left.
Signal intensity within the cervical spinal cord otherwise normal.

Posterior Fossa, vertebral arteries, paraspinal tissues:
Craniocervical junction within normal limits. Paraspinous and
prevertebral soft tissues normal. Normal intravascular flow void
within the right vertebral artery. Hypoplastic left vertebral artery
not well visualized.

Disc levels:

C2-C3: Negative interspace. Prominent right-sided facet
degeneration. Resultant mild right C3 foraminal stenosis. No
significant canal narrowing.

C3-C4: Chronic diffuse degenerative disc osteophyte with
intervertebral disc space narrowing. Prominent posterior component
effaces the ventral CSF with severe flattening of the cervical
spinal cord. Severe stenosis with the thecal sac measuring 3-4 mm at
its most narrow point. Associated myelomalacia noted. Severe
bilateral C4 foraminal stenosis.

C4-C5: Chronic diffuse degenerative disc osteophyte with bilateral
uncovertebral spurring. Flattening of the ventral CSF with moderate
spinal stenosis. Associated cord flattening without cord signal
changes, greater on the right. Severe right with moderate left C5
foraminal stenosis.

C5-C6: Diffuse disc bulge with right worse than left uncovertebral
spurring. Resultant mild spinal stenosis without significant cord
deformity. Severe bilateral C6 foraminal narrowing, right worse than
left.

C6-C7: Diffuse disc bulge with bilateral uncovertebral spurring.
Flattening of the ventral CSF. Mild facet hypertrophy. Resultant
mild to moderate spinal stenosis without cord deformity. Severe
bilateral C7 foraminal stenosis.

C7-T1: Mild facet hypertrophy. No significant canal stenosis. Mild
bilateral C8 foraminal narrowing.

Upper thoracic spine demonstrates no significant finding.
IMPRESSION: MRI HEAD IMPRESSION

Normal brain MRI for age.  No acute intracranial abnormality.

MRI CERVICAL SPINE IMPRESSION

1. Severe degenerative disc osteophyte at C3-4 with resultant severe
spinal stenosis and impingement of the cervical spinal cord.
Associated cord signal abnormality at this level consistent with
myelomalacia. Finding could result in patient's symptoms.
2. Additional moderate to severe multilevel cervical spondylolysis
at C4-5 through C6-7 with resultant mild to moderate diffuse spinal
stenosis as above. Severe bilateral C4 foraminal stenosis, severe
right with moderate left C5 foraminal narrowing, with severe
bilateral C6 and C7 foraminal stenosis

## 2020-02-24 ENCOUNTER — Ambulatory Visit (INDEPENDENT_AMBULATORY_CARE_PROVIDER_SITE_OTHER): Payer: Medicare Other | Admitting: Podiatry

## 2020-02-24 ENCOUNTER — Other Ambulatory Visit: Payer: Self-pay

## 2020-02-24 DIAGNOSIS — M79609 Pain in unspecified limb: Secondary | ICD-10-CM

## 2020-02-24 DIAGNOSIS — L603 Nail dystrophy: Secondary | ICD-10-CM

## 2020-02-24 DIAGNOSIS — G8929 Other chronic pain: Secondary | ICD-10-CM | POA: Insufficient documentation

## 2020-02-24 DIAGNOSIS — B351 Tinea unguium: Secondary | ICD-10-CM | POA: Diagnosis not present

## 2020-02-24 MED ORDER — TERBINAFINE HCL 250 MG PO TABS: 250.0000 mg | ORAL_TABLET | Freq: Every day | ORAL | 2 refills | Status: DC

## 2020-02-24 NOTE — Progress Notes (Signed)
  Subjective:  Patient ID: [name], male    DOB: [birth year],  MRN: [id number]  Chief Complaint  Patient presents with  . debridement    RFC   60 y.o. male presents with the above complaint. History confirmed with patient.   Objective:  Physical Exam: warm, good capillary refill, nail exam onychomycosis of the toenails, no trophic changes or ulcerative lesions. DP pulses palpable, PT pulses palpable and protective sensation intact Left Foot: normal exam, no swelling, tenderness, instability; ligaments intact, full range of motion of all ankle/foot joints  Right Foot: normal exam, no swelling, tenderness, instability; ligaments intact, full range of motion of all ankle/foot joints  No images are attached to the encounter.  Assessment:   1. Pain due to onychomycosis of nail   2. Nail dystrophy     Plan:  Patient was evaluated and treated and all questions answered.  Onychomycosis  -Labs reviewed - recent labs from 9/13 - AST 35 ALT 23. Will repeat -Baseline liver function studies ordered. Will d/c terbinafine if elevated during therapy. -eRx for oral terbinafine #30. Educated on risks and benefits of the medication. -Nails debrided x10  Total time for visit both face-to-face and non face-to-face including patient care, review of chart/imaging, documentation: 21 mins     No follow-ups on file.

## 2020-03-19 ENCOUNTER — Ambulatory Visit: Payer: Medicare Other | Admitting: Podiatry

## 2020-04-06 ENCOUNTER — Ambulatory Visit (INDEPENDENT_AMBULATORY_CARE_PROVIDER_SITE_OTHER): Payer: Medicare Other | Admitting: Podiatry

## 2020-04-06 DIAGNOSIS — Z5329 Procedure and treatment not carried out because of patient's decision for other reasons: Secondary | ICD-10-CM

## 2020-04-06 NOTE — Progress Notes (Signed)
No show for appt. 

## 2020-05-09 ENCOUNTER — Emergency Department (HOSPITAL_COMMUNITY): Payer: Medicare Other

## 2020-05-09 ENCOUNTER — Emergency Department (HOSPITAL_COMMUNITY)
Admission: EM | Admit: 2020-05-09 | Discharge: 2020-05-10 | Disposition: A | Discharge status: Home / Self Care | Payer: Medicare Other | Attending: Emergency Medicine | Admitting: Emergency Medicine

## 2020-05-09 ENCOUNTER — Encounter (HOSPITAL_COMMUNITY): Payer: Self-pay | Admitting: Emergency Medicine

## 2020-05-09 ENCOUNTER — Other Ambulatory Visit: Payer: Self-pay

## 2020-05-09 DIAGNOSIS — R079 Chest pain, unspecified: Secondary | ICD-10-CM | POA: Insufficient documentation

## 2020-05-09 DIAGNOSIS — R443 Hallucinations, unspecified: Secondary | ICD-10-CM | POA: Insufficient documentation

## 2020-05-09 DIAGNOSIS — F419 Anxiety disorder, unspecified: Secondary | ICD-10-CM | POA: Insufficient documentation

## 2020-05-09 DIAGNOSIS — F17210 Nicotine dependence, cigarettes, uncomplicated: Secondary | ICD-10-CM | POA: Insufficient documentation

## 2020-05-09 DIAGNOSIS — R002 Palpitations: Secondary | ICD-10-CM | POA: Insufficient documentation

## 2020-05-09 DIAGNOSIS — Z7982 Long term (current) use of aspirin: Secondary | ICD-10-CM | POA: Diagnosis not present

## 2020-05-09 DIAGNOSIS — R0602 Shortness of breath: Secondary | ICD-10-CM | POA: Diagnosis not present

## 2020-05-09 DIAGNOSIS — Z21 Asymptomatic human immunodeficiency virus [HIV] infection status: Secondary | ICD-10-CM | POA: Diagnosis not present

## 2020-05-09 DIAGNOSIS — R4182 Altered mental status, unspecified: Secondary | ICD-10-CM | POA: Diagnosis present

## 2020-05-09 DIAGNOSIS — F1990 Other psychoactive substance use, unspecified, uncomplicated: Secondary | ICD-10-CM

## 2020-05-09 DIAGNOSIS — F1490 Cocaine use, unspecified, uncomplicated: Secondary | ICD-10-CM | POA: Diagnosis not present

## 2020-05-09 LAB — RAPID URINE DRUG SCREEN, HOSP PERFORMED
Amphetamines: POSITIVE — AB
Barbiturates: NOT DETECTED
Benzodiazepines: NOT DETECTED
Cocaine: POSITIVE — AB
Opiates: NOT DETECTED
Tetrahydrocannabinol: POSITIVE — AB

## 2020-05-09 LAB — COMPREHENSIVE METABOLIC PANEL
ALT: 22 U/L (ref 0–44)
AST: 31 U/L (ref 15–41)
Albumin: 4.8 g/dL (ref 3.5–5.0)
Alkaline Phosphatase: 44 U/L (ref 38–126)
Anion gap: 13 (ref 5–15)
BUN: 11 mg/dL (ref 6–20)
CO2: 21 mmol/L — ABNORMAL LOW (ref 22–32)
Calcium: 9.6 mg/dL (ref 8.9–10.3)
Chloride: 100 mmol/L (ref 98–111)
Creatinine, Ser: 1.08 mg/dL (ref 0.61–1.24)
GFR, Estimated: >60 mL/min (ref >60)
Glucose, Bld: 71 mg/dL (ref 70–99)
Potassium: 4 mmol/L (ref 3.5–5.1)
Sodium: 134 mmol/L — ABNORMAL LOW (ref 135–145)
Total Bilirubin: 0.7 mg/dL (ref 0.3–1.2)
Total Protein: 8.2 g/dL — ABNORMAL HIGH (ref 6.5–8.1)

## 2020-05-09 LAB — CBC WITH DIFFERENTIAL/PLATELET
Abs Immature Granulocytes: 0.01 10*3/uL (ref 0.00–0.07)
Basophils Absolute: 0 10*3/uL (ref 0.0–0.1)
Basophils Relative: 1 %
Eosinophils Absolute: 0.1 10*3/uL (ref 0.0–0.5)
Eosinophils Relative: 2 %
HCT: 42.8 % (ref 39.0–52.0)
Hemoglobin: 14.1 g/dL (ref 13.0–17.0)
Immature Granulocytes: 0 %
Lymphocytes Relative: 49 %
Lymphs Abs: 2.4 10*3/uL (ref 0.7–4.0)
MCH: 28.2 pg (ref 26.0–34.0)
MCHC: 32.9 g/dL (ref 30.0–36.0)
MCV: 85.6 fL (ref 80.0–100.0)
Monocytes Absolute: 0.3 10*3/uL (ref 0.1–1.0)
Monocytes Relative: 6 %
Neutro Abs: 2 10*3/uL (ref 1.7–7.7)
Neutrophils Relative %: 42 %
Platelets: 288 10*3/uL (ref 150–400)
RBC: 5 MIL/uL (ref 4.22–5.81)
RDW: 14.9 % (ref 11.5–15.5)
WBC: 4.7 10*3/uL (ref 4.0–10.5)
nRBC: 0 % (ref 0.0–0.2)

## 2020-05-09 LAB — ETHANOL: Alcohol, Ethyl (B): 48 mg/dL — ABNORMAL HIGH (ref <10)

## 2020-05-09 LAB — TROPONIN I (HIGH SENSITIVITY): Troponin I (High Sensitivity): 7 ng/L (ref <18)

## 2020-05-09 MED ORDER — HYDROXYZINE HCL 10 MG PO TABS
10.0000 mg | ORAL_TABLET | Freq: Once | ORAL | Status: AC
  Administered 2020-05-09: 10 mg via ORAL
  Filled 2020-05-09: qty 1

## 2020-05-09 NOTE — ED Provider Notes (Signed)
[facility]-EMERGENCY DEPT Provider Note   CSN: [id number] Arrival date & time: 05/09/20  1941     History Chief Complaint  Patient presents with  . Ingestion    [name] is a 60 y.o. male.  HPI   60 year old male with a history of anal condyloma, hemorrhoids, HIV, spinal stenosis, who presents to the emergency department today for evaluation of altered mental status.  States that he was using cocaine earlier today and he has not felt right since.  He states that he has been intermittently hallucinating, having palpitations and increased anxiety.  This is not normal for him when he uses cocaine and he thinks that he was given something in addition to this.  He does admit to alcohol use tonight as well as using THC.  He denies any other coingestions at this time.  He denies any visual changes, headaches, unilateral numbness/weakness.  He does report he had some chest pain earlier today and is currently mildly short of breath.  Past Medical History:  Diagnosis Date  . 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 Hemorrhoids   . HIV (human immunodeficiency virus infection) (HCC)   . Stenosis of cervical spine    withb myelopathy    Patient Active Problem List   Diagnosis Date Noted  . Chronic back pain 02/24/2020  . Ileus (HCC) 03/24/2019  . MDD (major depressive disorder), recurrent episode (HCC) 04/20/2018  . Chronic right-sided low back pain 04/16/2018  . Weakness of right lower extremity 04/16/2018  . Healthcare maintenance 02/04/2018  . Blood loss anemia 01/28/2018  . Rectal bleeding 01/28/2018  . Alcohol dependence with alcohol-induced mood disorder (HCC)   . Major depressive disorder, recurrent severe without psychotic features (HCC) 08/01/2017  . Dysphagia 07/08/2017  . Stenosis of cervical spine with myelopathy (HCC) 07/06/2017  . Left-sided weakness 06/27/2017  . Tricompartment osteoarthritis of right knee 03/27/2017  . Alcohol abuse with alcohol-induced  mood disorder (HCC) 09/25/2016  . Polysubstance dependence (HCC) 09/25/2016  . Substance use disorder 09/19/2016  . 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 Cocaine abuse (HCC) 07/27/2015  . Suicidal ideation 07/27/2015  . Cocaine dependence with intoxication with complication (HCC) 07/23/2015  . Feeling like committing suicide 07/23/2015  . Metabolic acidosis 07/23/2015  . Tobacco abuse 07/23/2015  . Substance induced mood disorder (HCC) 07/19/2015  . Bipolar 1 disorder, depressed (HCC) 07/18/2015  . 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 HIV disease (HCC) 11/09/2008    Past Surgical History:  Procedure Laterality Date  . ANTERIOR CERVICAL DECOMP/DISCECTOMY FUSION N/A 07/06/2017   Procedure: ANTERIOR CERVICAL DECOMPRESSION/DISCECTOMY FUSION C3-4/C4-5 2 LEVELS;  Surgeon: Lisbeth Renshaw, MD;  Location: [facility] OR;  Service: Neurosurgery;  Laterality: N/A;  . HERNIA REPAIR  97   lt ing  . RECTAL EXAM UNDER ANESTHESIA N/A 04/24/2014   Procedure:  ligation of bleeding vessels;  Surgeon: Almond Lint, MD;  Location: [facility] ORS;  Service: General;  Laterality: N/A;  . TENDON REPAIR     2006-lt index  . WART FULGURATION  07/06/2011   Procedure: FULGURATION ANAL WART;  Surgeon: Shelly Rubenstein, MD;  Location: [facility];  Service: General;  Laterality: N/A;  excision anal condyloma  . WART FULGURATION N/A 04/23/2014   Procedure: EXCISION OF ANAL CONDYLOMA;  Surgeon: Abigail Miyamoto, MD;  Location: [facility];  Service: General;  Laterality: N/A;       Family History  Problem Relation Age of Onset  . Cancer Mother  brain  . Cancer Sister        breast    Social History   Tobacco Use  . Smoking status: Current Every Day Smoker    Packs/day: 0.50    Years: 25.00    Pack years: 12.50    Types: Cigarettes  . Smokeless tobacco: Never Used  Vaping Use  . Vaping Use: Never used  Substance Use Topics  . Alcohol use: Yes     Alcohol/week: 24.0 standard drinks    Types: 24 Cans of beer per week    Comment: ''I can drink a case a day''  . Drug use: Yes    Types: "Crack" cocaine    Comment: pt reports last use was Wednesday    Home Medications Prior to Admission medications   Medication Sig Start Date End Date Taking? Authorizing Provider  aspirin EC 81 MG tablet Take 81 mg by mouth daily.    [provider]  baclofen (LIORESAL) 10 MG tablet Take 20 mg by mouth 3 (three) times daily.  Patient not taking: No sig reported 02/16/18   [provider]  baclofen (LIORESAL) 20 MG tablet Take 20 mg by mouth 3 (three) times daily. 06/16/19   [provider]  buPROPion (WELLBUTRIN XL) 300 MG 24 hr tablet  06/13/19   [provider]  Darunavir-Cobicisctat-Emtricitabine-Tenofovir Alafenamide (SYMTUZA) 800-150-200-10 MG TABS Take 1 tablet by mouth daily with breakfast. 09/22/19   [name], MD  fluconazole (DIFLUCAN) 150 MG tablet Take 1 tablet (150 mg total) by mouth once a week. Patient not taking: No sig reported 06/26/19   Park Liter, DPM  gabapentin (NEURONTIN) 300 MG capsule Take 1 capsule (300 mg total) by mouth 3 (three) times daily. 04/11/18   Malvin Johns, MD  hydrOXYzine (ATARAX/VISTARIL) 25 MG tablet Take 1 tablet (25 mg total) by mouth every 6 (six) hours as needed (anxiety/agitation or CIWA < or = 10). 08/03/17   Starkes-Perry, Juel Burrow, FNP  pantoprazole (PROTONIX) 40 MG tablet Take 40 mg by mouth daily.    [provider]  terbinafine (LAMISIL) 250 MG tablet Take 1 tablet (250 mg total) by mouth daily. 02/24/20   Park Liter, DPM  topiramate (TOPAMAX) 100 MG tablet Take 1 tablet (100 mg total) by mouth 3 (three) times daily. Patient not taking: No sig reported 04/11/18   Malvin Johns, MD    Allergies    Shellfish allergy, Sulfa antibiotics, Fish allergy, and Clindamycin  Review of Systems   Review of Systems  Constitutional: Negative for chills and fever.   HENT: Negative for ear pain and sore throat.   Eyes: Negative for visual disturbance.  Respiratory: Positive for shortness of breath. Negative for cough.   Cardiovascular: Positive for chest pain and palpitations.  Gastrointestinal: Negative for abdominal pain, constipation, diarrhea, nausea and vomiting.  Genitourinary: Negative for dysuria and hematuria.  Musculoskeletal: Negative for back pain.  Skin: Negative for rash.  Neurological: Negative for dizziness, seizures, syncope, weakness, light-headedness, numbness and headaches.  Psychiatric/Behavioral:       Anxiety, denies si, hi.  All other systems reviewed and are negative.   Physical Exam Updated Vital Signs BP (!) 159/101   Pulse 95   Temp 98.5 F (36.9 C) (Oral)   Resp 18   Ht 6' (1.829 m)   Wt 72.6 kg   SpO2 100%   BMI 21.70 kg/m   Physical Exam Vitals and nursing note reviewed.  Constitutional:      Appearance: He is  well-developed.  HENT:     Head: Normocephalic and atraumatic.  Eyes:     Conjunctiva/sclera: Conjunctivae normal.  Cardiovascular:     Rate and Rhythm: Normal rate and regular rhythm.     Heart sounds: Normal heart sounds. No murmur heard.   Pulmonary:     Effort: Pulmonary effort is normal. No respiratory distress.     Breath sounds: Normal breath sounds. No wheezing, rhonchi or rales.  Abdominal:     General: Bowel sounds are normal.     Palpations: Abdomen is soft.     Tenderness: There is no abdominal tenderness. There is no guarding.  Musculoskeletal:     Cervical back: Neck supple.  Skin:    General: Skin is warm and dry.  Neurological:     Mental Status: He is alert.     Comments: Mental Status:  Alert, thought content appropriate, able to give a coherent history. Speech fluent without evidence of aphasia. Able to follow 2 step commands without difficulty.  Cranial Nerves:  II: pupils equal, round, reactive to light III,IV, VI: ptosis not present, extra-ocular motions intact  bilaterally  V,VII: smile symmetric, facial light touch sensation equal VIII: hearing grossly normal to voice  X: uvula elevates symmetrically  XI: bilateral shoulder shrug symmetric and strong XII: midline tongue extension without fassiculations Motor:  Normal tone. 5/5 strength of BUE and BLE major muscle groups including strong and equal grip strength and dorsiflexion/plantar flexion Sensory: light touch normal in all extremities.      ED Results / Procedures / Treatments   Labs (all labs ordered are listed, but only abnormal results are displayed) Labs Reviewed  COMPREHENSIVE METABOLIC PANEL - Abnormal; Notable for the following components:      Result Value   Sodium 134 (*)    CO2 21 (*)    Total Protein 8.2 (*)    All other components within normal limits  RAPID URINE DRUG SCREEN, HOSP PERFORMED - Abnormal; Notable for the following components:   Cocaine POSITIVE (*)    Amphetamines POSITIVE (*)    Tetrahydrocannabinol POSITIVE (*)    All other components within normal limits  ETHANOL - Abnormal; Notable for the following components:   Alcohol, Ethyl (B) 48 (*)    All other components within normal limits  CBC WITH DIFFERENTIAL/PLATELET  TROPONIN I (HIGH SENSITIVITY)  TROPONIN I (HIGH SENSITIVITY)    EKG EKG Interpretation  Date/Time:  Sunday May 09 2020 23:07:22 EDT Ventricular Rate:  80 PR Interval:  118 QRS Duration: 92 QT Interval:  420 QTC Calculation: 484 R Axis:   74 Text Interpretation: Normal sinus rhythm Possible Left atrial enlargement Left ventricular hypertrophy ( Nonspecific ST abnormality Prolonged QT Abnormal ECG No significant change since last tracing Confirmed by Derwood Kaplan [phone]) on 05/09/2020 11:14:52 PM   Radiology DG Chest 2 View  Result Date: 05/09/2020 CLINICAL DATA:  Arise after snorting powder at 1400 hours, now with anxiety and hallucinations EXAM: CHEST - 2 VIEW COMPARISON:  Radiograph 09/24/2016, CT 03/15/2007 FINDINGS:  Chronic elevation of the left hemidiaphragm is unchanged from priors. No consolidation, features of edema, pneumothorax, or effusion. The cardiomediastinal contours are unremarkable. Redemonstration of the loose body in the recesses of the right shoulder. Additional asymmetric right greater than left degenerative changes at the glenohumeral and acromioclavicular joints. Mild degenerative features in the spine. Radiopaque necklace at the base the neck. IMPRESSION: No acute cardiopulmonary abnormality. Chronic degenerative changes in the right shoulder including mineralized joint body in the recess.  Electronically Signed   By: Kreg ShropshirePrice  DeHay M.D.   On: 05/09/2020 23:20   CT Head Wo Contrast  Result Date: 05/09/2020 CLINICAL DATA:  Mental status change, substance use, anxiety symptoms and hallucinations after snorting white powder which was supposed to be cocaine EXAM: CT HEAD WITHOUT CONTRAST TECHNIQUE: Contiguous axial images were obtained from the base of the skull through the vertex without intravenous contrast. COMPARISON:  CT 06/27/2017, MR 06/27/2017 FINDINGS: Brain: No evidence of acute infarction, hemorrhage, hydrocephalus, extra-axial collection, visible mass lesion or mass effect. Benign dural calcifications. Vascular: Atherosclerotic calcification of the carotid siphons. No hyperdense vessel. Skull: No calvarial fracture or suspicious osseous lesion. No scalp swelling or hematoma. Sinuses/Orbits: Paranasal sinuses and mastoid air cells are predominantly clear. Included orbital structures are unremarkable. Other: Debris in the bilateral external auditory canals. IMPRESSION: No acute intracranial abnormality. Intracranial atherosclerosis. Debris in the external auditory canals, correlate for cerumen impaction. Electronically Signed   By: Kreg ShropshirePrice  DeHay M.D.   On: 05/09/2020 23:58    Procedures Procedures   Medications Ordered in ED Medications  hydrOXYzine (ATARAX/VISTARIL) tablet 10 mg (10 mg Oral  Given 05/09/20 2316)    ED Course  I have reviewed the triage vital signs and the nursing notes.  Pertinent labs & imaging results that were available during my care of the patient were reviewed by me and considered in my medical decision making (see chart for details).    MDM Rules/Calculators/A&P                          60 y/o m presenting for eval of ams after using cocaine. No focal neuro deficits so doubt cva. He had some cp earlier which has subsided. Will get labs, uds, etoh, ct head.  CBC wnl CMP unremarkable Trop neg ETOH slightly elevated UDS + for meth, cocaine and thc  EKG unchanged  CXR - No acute cardiopulmonary abnormality. Chronic degenerative changes in the right shoulder including mineralized joint body in the recess. CT head - No acute intracranial abnormality. Intracranial atherosclerosis. Debris in the external auditory canals, correlate for cerumen impaction.  Patient's work-up here is reassuring.  He is alert, oriented and does not appear to be acutely intoxicated or confused therefore I feel that he has appropriate medical decision-making capacity and he is appropriate for discharge at this time.  His change in mental status and other concerns are likely related to his drug use.  He does not have any SI, HI and does not appear to need admission emergent psychiatric consultation or any further work-up.  Patient was counseled on cessation of drug use.  He refused resources.  Advised on plan for follow-up and return precautions.    Final Clinical Impression(s) / ED Diagnoses Final diagnoses:  Substance use    Rx / DC Orders ED Discharge Orders    None       Karrie MeresCouture, Pope Brunty S, PA-C 05/10/20 0009    Derwood KaplanNanavati, Ankit, MD 05/11/20 [phone]

## 2020-05-09 NOTE — ED Triage Notes (Signed)
Patient arrives after snorting a powder that was supposed to be cocaine around 1400. Patient having anxiety symptoms and hallucinations, but is unable to describe his hallucinations. Patient just states, "I just don't even know."

## 2020-05-09 NOTE — ED Notes (Addendum)
Patient transported to X-ray 

## 2020-05-10 NOTE — Discharge Instructions (Addendum)
Your drug test showed that you were positive for cocaine, methamphetamines and marijuana.  This is likely the cause of your symptoms today.  Please follow up with your primary care provider within 5-7 days for re-evaluation of your symptoms. If you do not have a primary care provider, information for a healthcare clinic has been provided for you to make arrangements for follow up care. Please return to the emergency department for any new or worsening symptoms.

## 2020-10-28 ENCOUNTER — Telehealth: Payer: Self-pay

## 2020-10-28 DIAGNOSIS — B20 Human immunodeficiency virus [HIV] disease: Secondary | ICD-10-CM

## 2020-10-28 MED ORDER — SYMTUZA 800-150-200-10 MG PO TABS: 1.0000 | ORAL_TABLET | Freq: Every day | ORAL | 2 refills | Status: DC

## 2020-10-28 NOTE — Telephone Encounter (Signed)
Patient called requesting refill of Symtuza, says he is currently at a treatment facility in [place] and will not be back in town until December. Advised patient that we will send in enough medication to get him until then, but that we need to see him some time in December to touch base with him. Patient verbalized understanding and has no further questions.   Sandie Ano, RN

## 2020-12-22 ENCOUNTER — Ambulatory Visit (HOSPITAL_COMMUNITY): Payer: Medicare Other | Admitting: Clinical

## 2020-12-23 ENCOUNTER — Ambulatory Visit: Payer: Medicare Other | Admitting: Internal Medicine

## 2021-01-20 ENCOUNTER — Ambulatory Visit: Payer: Medicare Other | Admitting: Internal Medicine

## 2021-03-01 ENCOUNTER — Ambulatory Visit (INDEPENDENT_AMBULATORY_CARE_PROVIDER_SITE_OTHER): Payer: Medicare Other | Admitting: Internal Medicine

## 2021-03-01 ENCOUNTER — Other Ambulatory Visit: Payer: Self-pay

## 2021-03-01 ENCOUNTER — Encounter: Payer: Self-pay | Admitting: Internal Medicine

## 2021-03-01 VITALS — BP 135/84 | HR 92 | Temp 98.5°F | Resp 16 | Ht 72.0 in | Wt 162.4 lb

## 2021-03-01 DIAGNOSIS — B20 Human immunodeficiency virus [HIV] disease: Secondary | ICD-10-CM

## 2021-03-01 DIAGNOSIS — F320 Major depressive disorder, single episode, mild: Secondary | ICD-10-CM | POA: Diagnosis not present

## 2021-03-01 DIAGNOSIS — F1024 Alcohol dependence with alcohol-induced mood disorder: Secondary | ICD-10-CM

## 2021-03-01 DIAGNOSIS — R221 Localized swelling, mass and lump, neck: Secondary | ICD-10-CM

## 2021-03-01 MED ORDER — SYMTUZA 800-150-200-10 MG PO TABS: 1.0000 | ORAL_TABLET | Freq: Every day | ORAL | 2 refills | Status: DC

## 2021-03-01 NOTE — Progress Notes (Signed)
RFV; follow up for hiv disease  Patient ID: [name], male   DOB: 11/17/1960, 61 y.o.   MRN: [id number]  HPI [name] is a 61yo M with well controlled hiv disease on symtuza. Has been off of meds for 4 days.  Finished inpatient rehab for 2.5 months.finished on 01/03/2023. Had death of his nephew on 01/14/2021. Went back to be family in [place]. "I wish I could kick this alcohol habit" and sometimes it leads him to using cocaine. needs long-term program [place] to be a great-great uncle. His family resides in [place].   Stable right sided neck mass (lipoma) 7 x 7cm- causing neck strain.   Outpatient Encounter Medications as of 03/01/2021  Medication Sig   aspirin EC 81 MG tablet Take 81 mg by mouth daily.   buPROPion (WELLBUTRIN XL) 300 MG 24 hr tablet    Darunavir-Cobicistat-Emtricitabine-Tenofovir Alafenamide (SYMTUZA) 800-150-200-10 MG TABS Take 1 tablet by mouth daily with breakfast.   gabapentin (NEURONTIN) 800 MG tablet Take 800 mg by mouth 3 (three) times daily.   hydrOXYzine (ATARAX/VISTARIL) 25 MG tablet Take 1 tablet (25 mg total) by mouth every 6 (six) hours as needed (anxiety/agitation or CIWA < or = 10).   losartan (COZAAR) 25 MG tablet Take 25 mg by mouth daily.   tiZANidine (ZANAFLEX) 4 MG tablet Take 4 mg by mouth 2 (two) times daily.   baclofen (LIORESAL) 10 MG tablet Take 20 mg by mouth 3 (three) times daily.  (Patient not taking: Reported on 09/22/2019)   baclofen (LIORESAL) 20 MG tablet Take 20 mg by mouth 3 (three) times daily. (Patient not taking: Reported on 03/01/2021)   fluconazole (DIFLUCAN) 150 MG tablet Take 1 tablet (150 mg total) by mouth once a week. (Patient not taking: Reported on 08/28/2019)   gabapentin (NEURONTIN) 300 MG capsule Take 1 capsule (300 mg total) by mouth 3 (three) times daily. (Patient not taking: Reported on 03/01/2021)   pantoprazole (PROTONIX) 40 MG tablet Take 40 mg by mouth daily. (Patient not taking: Reported on 03/01/2021)   terbinafine  (LAMISIL) 250 MG tablet Take 1 tablet (250 mg total) by mouth daily. (Patient not taking: Reported on 03/01/2021)   topiramate (TOPAMAX) 100 MG tablet Take 1 tablet (100 mg total) by mouth 3 (three) times daily. (Patient not taking: Reported on 09/22/2019)   No facility-administered encounter medications on file as of 03/01/2021.     Patient Active Problem List   Diagnosis Date Noted   Chronic back pain 02/24/2020   Ileus ([place]) 03/24/2019   MDD (major depressive disorder), recurrent episode ([place]) 04/20/2018   Chronic right-sided low back pain 04/16/2018   Weakness of right lower extremity 04/16/2018   Healthcare maintenance 02/04/2018   Blood loss anemia 01/28/2018   Rectal bleeding 01/28/2018   Alcohol dependence with alcohol-induced mood disorder ([place])    Major depressive disorder, recurrent severe without psychotic features ([place]) 08/01/2017   Dysphagia 07/08/2017   Stenosis of cervical spine with myelopathy ([place]) 07/06/2017   Left-sided weakness 06/27/2017   Tricompartment osteoarthritis of right knee 03/27/2017   Alcohol abuse with alcohol-induced mood disorder ([place]) 09/25/2016   Polysubstance dependence ([place]) 09/25/2016   Substance use disorder 09/19/2016   MDD (major depressive disorder), recurrent severe, without psychosis ([place]) 09/18/2016   Cocaine abuse ([place]) 07/27/2015   Suicidal ideation 07/27/2015   Cocaine dependence with intoxication with complication ([place]) 123XX123   Feeling like committing suicide 123XX123   Metabolic acidosis 123XX123   Tobacco abuse 07/23/2015   Substance  induced mood disorder ([place]) 07/19/2015   Bipolar 1 disorder, depressed ([place]) 07/18/2015   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   HIV disease ([place]) 11/09/2008     Health Maintenance Due  Topic Date Due   COVID-19 Vaccine (1) Never done   Hepatitis C Screening  Never done   TETANUS/TDAP  Never done   Zoster Vaccines- Shingrix (1 of 2) Never done   COLONOSCOPY (Pts 45-70yrs Insurance coverage will  need to be confirmed)  Never done   INFLUENZA VACCINE  08/09/2020     Review of Systems 12 point ros except what is mentioned in hpi Physical Exam   BP 135/84   Pulse 92   Temp 98.5 F (36.9 C) (Oral)   Resp 16   Ht 6' (1.829 m)   Wt 162 lb 6.4 oz (73.7 kg)   SpO2 97%   BMI 22.03 kg/m   Physical Exam  Constitutional: He is oriented to person, place, and time. He appears well-developed and well-nourished. No distress.  HENT: neck mass/lipoa Mouth/Throat: Oropharynx is clear and moist. No oropharyngeal exudate.  Cardiovascular: Normal rate, regular rhythm and normal heart sounds. Exam reveals no gallop and no friction rub.  No murmur heard.  Pulmonary/Chest: Effort normal and breath sounds normal. No respiratory distress. He has no wheezes.  Abdominal: Soft. Bowel sounds are normal. He exhibits no distension. There is no tenderness.  Lymphadenopathy:  He has no cervical adenopathy.  Neurological: He is alert and oriented to person, place, and time.  Skin: Skin is warm and dry. No rash noted. No erythema.  Psychiatric: He has a normal mood and affect. His behavior is normal.   Lab Results  Component Value Date   CD4TCELL 31 (L) 09/22/2019   Lab Results  Component Value Date   CD4TABS 515 09/22/2019   CD4TABS 433 06/18/2019   CD4TABS 340 (L) 02/04/2018   Lab Results  Component Value Date   HIV1RNAQUANT <20 (H) 09/22/2019   No results found for: HEPBSAB Lab Results  Component Value Date   LABRPR NON-REACTIVE 09/22/2019    CBC Lab Results  Component Value Date   WBC 4.7 05/09/2020   RBC 5.00 05/09/2020   HGB 14.1 05/09/2020   HCT 42.8 05/09/2020   PLT 288 05/09/2020   MCV 85.6 05/09/2020   MCH 28.2 05/09/2020   MCHC 32.9 05/09/2020   RDW 14.9 05/09/2020   LYMPHSABS 2.4 05/09/2020   MONOABS 0.3 05/09/2020   EOSABS 0.1 05/09/2020    BMET Lab Results  Component Value Date   NA 134 (L) 05/09/2020   K 4.0 05/09/2020   CL 100 05/09/2020   CO2 21 (L)  05/09/2020   GLUCOSE 71 05/09/2020   BUN 11 05/09/2020   CREATININE 1.08 05/09/2020   CALCIUM 9.6 05/09/2020   GFRNONAA >60 05/09/2020   GFRAA 99 09/22/2019      Assessment and Plan  Hiv disease= will check labs; continue in symtuza  Right sided neck lipoma = will do neck CT, and referral to plastic surgery  Alcohol dependence = drinking 40 oz. A day, wants to seek treatment  Right knee pain = chronic? May need ortho referral for evaluatoin  Depression = self treating with alcohol.

## 2021-03-06 LAB — T-HELPER CELLS (CD4) COUNT (NOT AT ARMC)
Absolute CD4: 735 cells/uL (ref 490–1740)
CD4 T Helper %: 28 % — ABNORMAL LOW (ref 30–61)
Total lymphocyte count: 2606 cells/uL (ref 850–3900)

## 2021-03-06 LAB — CBC WITH DIFFERENTIAL/PLATELET
Absolute Monocytes: 399 cells/uL (ref 200–950)
Basophils Absolute: 29 cells/uL (ref 0–200)
Basophils Relative: 0.5 %
Eosinophils Absolute: 40 cells/uL (ref 15–500)
Eosinophils Relative: 0.7 %
HCT: 40.2 % (ref 38.5–50.0)
Hemoglobin: 13.6 g/dL (ref 13.2–17.1)
Lymphs Abs: 2531 cells/uL (ref 850–3900)
MCH: 27.5 pg (ref 27.0–33.0)
MCHC: 33.8 g/dL (ref 32.0–36.0)
MCV: 81.4 fL (ref 80.0–100.0)
MPV: 10.6 fL (ref 7.5–12.5)
Monocytes Relative: 7 %
Neutro Abs: 2702 cells/uL (ref 1500–7800)
Neutrophils Relative %: 47.4 %
Platelets: 302 10*3/uL (ref 140–400)
RBC: 4.94 10*6/uL (ref 4.20–5.80)
RDW: 15.7 % — ABNORMAL HIGH (ref 11.0–15.0)
Total Lymphocyte: 44.4 %
WBC: 5.7 10*3/uL (ref 3.8–10.8)

## 2021-03-06 LAB — COMPLETE METABOLIC PANEL WITH GFR
AG Ratio: 1.4 (calc) (ref 1.0–2.5)
ALT: 17 U/L (ref 9–46)
AST: 26 U/L (ref 10–35)
Albumin: 4.6 g/dL (ref 3.6–5.1)
Alkaline phosphatase (APISO): 54 U/L (ref 35–144)
BUN: 15 mg/dL (ref 7–25)
CO2: 27 mmol/L (ref 20–32)
Calcium: 9.9 mg/dL (ref 8.6–10.3)
Chloride: 104 mmol/L (ref 98–110)
Creat: 1.07 mg/dL (ref 0.70–1.35)
Globulin: 3.2 g/dL (calc) (ref 1.9–3.7)
Glucose, Bld: 88 mg/dL (ref 65–99)
Potassium: 4.7 mmol/L (ref 3.5–5.3)
Sodium: 139 mmol/L (ref 135–146)
Total Bilirubin: 0.5 mg/dL (ref 0.2–1.2)
Total Protein: 7.8 g/dL (ref 6.1–8.1)
eGFR: 79 mL/min/{1.73_m2} (ref >=60)

## 2021-03-06 LAB — HIV-1 RNA ULTRAQUANT REFLEX TO GENTYP+
HIV 1 RNA Quant: <20 copies/mL — AB
HIV-1 RNA Quant, Log: <1.3 Log copies/mL — AB

## 2021-03-06 LAB — RPR: RPR Ser Ql: NONREACTIVE

## 2021-03-08 ENCOUNTER — Telehealth (HOSPITAL_COMMUNITY): Payer: Medicare Other | Admitting: Psychiatry

## 2021-03-08 ENCOUNTER — Other Ambulatory Visit: Payer: Self-pay

## 2021-03-10 ENCOUNTER — Telehealth: Payer: Medicare Other | Admitting: Internal Medicine

## 2021-03-17 ENCOUNTER — Inpatient Hospital Stay: Admission: RE | Admit: 2021-03-17 | Payer: Medicare Other | Source: Ambulatory Visit

## 2021-04-12 ENCOUNTER — Ambulatory Visit: Payer: Medicare Other | Admitting: Internal Medicine

## 2021-04-13 ENCOUNTER — Encounter: Payer: Self-pay | Admitting: Podiatry

## 2021-04-13 ENCOUNTER — Ambulatory Visit (INDEPENDENT_AMBULATORY_CARE_PROVIDER_SITE_OTHER): Payer: Medicare Other | Admitting: Podiatry

## 2021-04-13 DIAGNOSIS — M79675 Pain in left toe(s): Secondary | ICD-10-CM

## 2021-04-13 DIAGNOSIS — L603 Nail dystrophy: Secondary | ICD-10-CM

## 2021-04-13 DIAGNOSIS — M79674 Pain in right toe(s): Secondary | ICD-10-CM | POA: Diagnosis not present

## 2021-04-13 DIAGNOSIS — B351 Tinea unguium: Secondary | ICD-10-CM | POA: Diagnosis not present

## 2021-04-13 NOTE — Progress Notes (Signed)
?  Subjective:  ?Patient ID: [name], male    DOB: 1960-01-18,  MRN: [id number] ? ?No chief complaint on file. ? ?61 y.o. male concern of thickened elongated and painful nails that are difficult to trim. Requesting to have them trimmed today. Denies any burning or tingling in her feet. Patient is not diabetic.  ? ? ?Objective:  ?Physical Exam: warm, good capillary refill, nail exam onychomycosis of the toenails, no trophic changes or ulcerative lesions. DP pulses palpable, PT pulses palpable and protective sensation intact ?Left Foot: normal exam, no swelling, tenderness, instability; ligaments intact, full range of motion of all ankle/foot joints  ?Right Foot: normal exam, no swelling, tenderness, instability; ligaments intact, full range of motion of all ankle/foot joints ? ?No images are attached to the encounter. ? ?Assessment:  ? ?1. Pain due to onychomycosis of nail   ?2. Nail dystrophy   ? ? ? ?Plan:  ?Patient was evaluated and treated and all questions answered. ? ?Onychomycosis  ?-Discussed and educated patient on foot care, especially with  ?regards to the vascular, neurological and musculoskeletal systems.  ?-Discussed supportive shoes at all times and checking feet regularly.  ?-Mechanically debrided all nails 1-5 bilateral using sterile nail nipper and filed with dremel without incident  ?-Answered all patient questions ?-Patient to return  in 3 months for at risk foot care ?-Patient advised to call the office if any problems or questions arise in the meantime. ? ? ? ? ?Return in about 3 months (around 07/13/2021) for rfc.  ?

## 2021-05-23 ENCOUNTER — Encounter (HOSPITAL_COMMUNITY): Payer: Self-pay

## 2021-05-23 ENCOUNTER — Emergency Department (HOSPITAL_COMMUNITY)
Admission: EM | Admit: 2021-05-23 | Discharge: 2021-05-23 | Disposition: A | Discharge status: Home / Self Care | Payer: Medicare Other | Attending: Emergency Medicine | Admitting: Emergency Medicine

## 2021-05-23 ENCOUNTER — Emergency Department (HOSPITAL_COMMUNITY): Payer: Medicare Other

## 2021-05-23 DIAGNOSIS — W1830XA Fall on same level, unspecified, initial encounter: Secondary | ICD-10-CM | POA: Insufficient documentation

## 2021-05-23 DIAGNOSIS — Z21 Asymptomatic human immunodeficiency virus [HIV] infection status: Secondary | ICD-10-CM | POA: Insufficient documentation

## 2021-05-23 DIAGNOSIS — M13851 Other specified arthritis, right hip: Secondary | ICD-10-CM | POA: Diagnosis not present

## 2021-05-23 DIAGNOSIS — M25561 Pain in right knee: Secondary | ICD-10-CM | POA: Insufficient documentation

## 2021-05-23 DIAGNOSIS — M79604 Pain in right leg: Secondary | ICD-10-CM | POA: Diagnosis present

## 2021-05-23 DIAGNOSIS — M13862 Other specified arthritis, left knee: Secondary | ICD-10-CM | POA: Diagnosis not present

## 2021-05-23 DIAGNOSIS — M1712 Unilateral primary osteoarthritis, left knee: Secondary | ICD-10-CM

## 2021-05-23 DIAGNOSIS — M1610 Unilateral primary osteoarthritis, unspecified hip: Secondary | ICD-10-CM

## 2021-05-23 DIAGNOSIS — Z7982 Long term (current) use of aspirin: Secondary | ICD-10-CM | POA: Diagnosis not present

## 2021-05-23 MED ORDER — OXYCODONE-ACETAMINOPHEN 5-325 MG PO TABS
2.0000 | ORAL_TABLET | Freq: Once | ORAL | Status: AC
  Administered 2021-05-23: 2 via ORAL
  Filled 2021-05-23: qty 2

## 2021-05-23 MED ORDER — HYDROCODONE-ACETAMINOPHEN 5-325 MG PO TABS: 1.0000 | ORAL_TABLET | Freq: Four times a day (QID) | ORAL | 0 refills | Status: DC | PRN

## 2021-05-23 NOTE — ED Triage Notes (Signed)
Pt reports frequent falls and no strength in his legs. Reports falling 4x today.  ? 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  ?Xray of right knee shows bicompartmental degenerative arthrosis.  ? ?Paper work with pt.  ? ?Pt reports he was sent over for MRI.  ? ?10/10 pain ?

## 2021-05-23 NOTE — ED Provider Triage Note (Signed)
Emergency Medicine Provider Triage Evaluation Note ? ?[name] , a 61 y.o. male  was evaluated in triage.  Pt complains of right hip pain and right knee pain onset 5 months.  Patient notes he was evaluated at a hospital in [place] over the weekend and at that time had x-rays completed.  Patient does not have an orthopedist.  Notes that he has had falls due to pain in his leg.  Denies hitting his head or LOC. Pt ambulates with a cane at baseline. ? ?Review of Systems  ?Positive: As per HPI above ?Negative:  ? ?Physical Exam  ?BP 107/69 (BP Location: Left Arm)   Pulse 89   Temp 98.7 ?F (37.1 ?C) (Oral)   Resp 18   SpO2 96%  ?Gen:   Awake, no distress   ?Resp:  Normal effort  ?MSK:   Moves extremities without difficulty  ?Other:  Able to flex and extend right knee. TTP noted to right hip with flexion of right hip. Pt not ambulated in triage. ? ?Medical Decision Making  ?Medically screening exam initiated at 2:54 PM.  Appropriate orders placed.  [name] was informed that the remainder of the evaluation will be completed by another provider, this initial triage assessment does not replace that evaluation, and the importance of remaining in the ED until their evaluation is complete. ? ?Work-up initiated. ?  ?Leasha Goldberger A, PA-C ?05/23/21 1457 ? ?

## 2021-05-23 NOTE — Discharge Instructions (Signed)
You have arthritis of the hip and knee and you may need joint replacement ? ?Continue taking Tylenol for pain and take Vicodin for severe pain ? ?Please call Dr. Shelba Flake office tomorrow to schedule for appointment ? ?Return to ER if you have worse knee and hip pain and unable to walk ?

## 2021-05-23 NOTE — ED Provider Notes (Signed)
?[facility]-EMERGENCY DEPT ?Provider Note ? ? ?CSN: [id number] ?Arrival date & time: 05/23/21  1347 ? ?  ? ?History ? ?Chief Complaint  ?Patient presents with  ? Fall  ? Weakness  ? ? ?[name] is a 61 y.o. male hx of HIV, alcohol abuse, here with right leg pain.  Patient has right knee and right hip pain for the last several months.  Patient states that it is very hard for him to even get up to walk without pain.  He walks with a cane at baseline.  He was in [place] for Mother's Day and went to an ER there and had x-ray that showed arthritis.  He was told that he needs to follow-up with the orthopedic doctor but does not have one right now.  He initially told me that he is supposed to get MRI but he states that he does not have an orthopedic doctor right now and is not any pain medicine.  He states that he takes Tylenol Motrin with minimal relief ? ?The history is provided by the patient.  ? ?  ? ?Home Medications ?Prior to Admission medications   ?Medication Sig Start Date End Date Taking? Authorizing Provider  ?aspirin EC 81 MG tablet Take 81 mg by mouth daily.    [provider]  ?baclofen (LIORESAL) 10 MG tablet Take 20 mg by mouth 3 (three) times daily.  ?Patient not taking: Reported on 09/22/2019 02/16/18   [provider]  ?baclofen (LIORESAL) 20 MG tablet Take 20 mg by mouth 3 (three) times daily. ?Patient not taking: Reported on 03/01/2021 06/16/19   [provider]  ?buPROPion (WELLBUTRIN XL) 300 MG 24 hr tablet  06/13/19   [provider]  ?Darunavir-Cobicistat-Emtricitabine-Tenofovir Alafenamide (SYMTUZA) 800-150-200-10 MG TABS Take 1 tablet by mouth daily with breakfast. 03/01/21   [name], MD  ?fluconazole (DIFLUCAN) 150 MG tablet Take 1 tablet (150 mg total) by mouth once a week. ?Patient not taking: Reported on 08/28/2019 06/26/19   Park Liter, DPM  ?gabapentin (NEURONTIN) 300 MG capsule Take 1 capsule (300 mg total) by mouth 3 (three)  times daily. ?Patient not taking: Reported on 03/01/2021 04/11/18   Malvin Johns, MD  ?gabapentin (NEURONTIN) 800 MG tablet Take 800 mg by mouth 3 (three) times daily. 02/12/21   [provider]  ?hydrOXYzine (ATARAX/VISTARIL) 25 MG tablet Take 1 tablet (25 mg total) by mouth every 6 (six) hours as needed (anxiety/agitation or CIWA < or = 10). 08/03/17   Starkes-Perry, Juel Burrow, FNP  ?losartan (COZAAR) 25 MG tablet Take 25 mg by mouth daily. 11/25/20   [provider]  ?pantoprazole (PROTONIX) 40 MG tablet Take 40 mg by mouth daily. ?Patient not taking: Reported on 03/01/2021    [provider]  ?terbinafine (LAMISIL) 250 MG tablet Take 1 tablet (250 mg total) by mouth daily. ?Patient not taking: Reported on 03/01/2021 02/24/20   Park Liter, DPM  ?tiZANidine (ZANAFLEX) 4 MG tablet Take 4 mg by mouth 2 (two) times daily. 11/23/20   [provider]  ?topiramate (TOPAMAX) 100 MG tablet Take 1 tablet (100 mg total) by mouth 3 (three) times daily. ?Patient not taking: Reported on 09/22/2019 04/11/18   Malvin Johns, MD  ?   ? ?Allergies    ?Shellfish allergy, Sulfa antibiotics, Fish allergy, and Clindamycin   ? ?Review of Systems   ?Review of Systems  ?Musculoskeletal:   ?     Right hip and knee pain  ?All other  systems reviewed and are negative. ? ?Physical Exam ?Updated Vital Signs ?BP 107/69 (BP Location: Left Arm)   Pulse 89   Temp 98.7 ?F (37.1 ?C) (Oral)   Resp 18   SpO2 96%  ?Physical Exam ?Vitals and nursing note reviewed.  ?Constitutional:   ?   Comments: Uncomfortable  ?HENT:  ?   Head: Normocephalic.  ?   Nose: Nose normal.  ?   Mouth/Throat:  ?   Mouth: Mucous membranes are moist.  ?Eyes:  ?   Extraocular Movements: Extraocular movements intact.  ?   Pupils: Pupils are equal, round, and reactive to light.  ?Cardiovascular:  ?   Rate and Rhythm: Normal rate.  ?   Pulses: Normal pulses.  ?Pulmonary:  ?   Effort: Pulmonary effort is normal.  ?Abdominal:  ?   General: Abdomen is  flat.  ?Musculoskeletal:  ?   Cervical back: Normal range of motion.  ?   Comments: Decreased range of motion of the right hip but no obvious deformity.  Also decreased range of motion of the right knee but no obvious knee effusion.  Patient was able to bear weight on the right leg with his cane.  No spinal tenderness  ?Skin: ?   Capillary Refill: Capillary refill takes less than 2 seconds.  ?Neurological:  ?   General: No focal deficit present.  ?   Mental Status: He is alert and oriented to person, place, and time.  ?Psychiatric:     ?   Mood and Affect: Mood normal.     ?   Behavior: Behavior normal.  ? ? ?ED Results / Procedures / Treatments   ?Labs ?(all labs ordered are listed, but only abnormal results are displayed) ?Labs Reviewed - No data to display ? ?EKG ?None ? ?Radiology ?DG Knee Complete 4 Views Right ? ?Result Date: 05/23/2021 ?CLINICAL DATA:  Right knee and hip pain after frequent fall. EXAM: RIGHT KNEE - COMPLETE 4+ VIEW COMPARISON:  None Available. FINDINGS: No evidence of fracture, dislocation, or joint effusion. Scratch at joint spaces are maintained. There is mild tricompartmental osteophyte formation. Soft tissues are unremarkable. IMPRESSION: 1. No acute bony abnormality. 2. Mild tricompartmental osteoarthrosis. Electronically Signed   By: Darliss Cheney M.D.   On: 05/23/2021 15:41  ? ?DG Hip Unilat W or Wo Pelvis 2-3 Views Right ? ?Result Date: 05/23/2021 ?CLINICAL DATA:  Right hip pain. Frequent falls. Patient has fallen 4 times today. EXAM: DG HIP (WITH OR WITHOUT PELVIS) 2-3V RIGHT COMPARISON:  Right hip radiographs 04/07/2018 FINDINGS: Progressive severe degenerative changes are present in the right hip. Progressive sclerosis noted in both the femoral head and acetabulum with some flattening of the femoral head. No acute or healing fractures are present. The visualized pelvis is otherwise unremarkable. No significant degenerative changes are present in the left hip. IMPRESSION: Progressive  severe degenerative changes of the right hip. Electronically Signed   By: Marin Roberts M.D.   On: 05/23/2021 15:37   ? ?Procedures ?Procedures  ? ? ?Medications Ordered in ED ?Medications  ?oxyCODONE-acetaminophen (PERCOCET/ROXICET) 5-325 MG per tablet 2 tablet (has no administration in time range)  ? ? ?ED Course/ Medical Decision Making/ A&P ?  ?                        ?Medical Decision Making ?[name] is a 61 y.o. male here presenting with right hip and knee pain.  Patient has a history of arthritis in the  hip and knee.  I reviewed and independently interpreted his x-rays.  It showed arthritis of the knee and hip.  Patient has no back pain to suggest spinal compression and no saddle anesthesia.  At this point I told him that what he needs is an orthopedic doctor to further evaluate this and he may need a joint replacement.  We will give him short course of pain medicine for now. ? ? ?Problems Addressed: ?Arthritis of knee, left: acute illness or injury ?Arthritis, hip: acute illness or injury ? ?Amount and/or Complexity of Data Reviewed ?Radiology: ordered and independent interpretation performed. Decision-making details documented in ED Course. ? ?Risk ?Prescription drug management. ? ? ? ?Final Clinical Impression(s) / ED Diagnoses ?Final diagnoses:  ?None  ? ? ?Rx / DC Orders ?ED Discharge Orders   ? ? None  ? ?  ? ? ?  ?Charlynne PanderYao, Shahmeer Bunn Hsienta, MD ?05/23/21 [phone] ? ?

## 2021-05-26 ENCOUNTER — Ambulatory Visit: Payer: Medicare Other

## 2021-06-03 ENCOUNTER — Ambulatory Visit: Payer: Medicare Other | Admitting: Student

## 2021-06-03 NOTE — Progress Notes (Unsigned)
Subjective:  Patient ID: [name], male    DOB: 03-26-1960, 61 y.o.   MRN: [id number]  CC: New Patient  HPI:  [name] is a very pleasant 61 y.o. male who presents today to establish care.   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   Hemorrhoids    HIV (human immunodeficiency virus infection) (HCC)    Stenosis of cervical spine    withb myelopathy    Surgical Hx: Past Surgical History:  Procedure Laterality Date   ANTERIOR CERVICAL DECOMP/DISCECTOMY FUSION N/A 07/06/2017   Procedure: ANTERIOR CERVICAL DECOMPRESSION/DISCECTOMY FUSION C3-4/C4-5 2 LEVELS;  Surgeon: Lisbeth Renshaw, MD;  Location: [facility] OR;  Service: Neurosurgery;  Laterality: N/A;   HERNIA REPAIR  97   lt ing   RECTAL EXAM UNDER ANESTHESIA N/A 04/24/2014   Procedure:  ligation of bleeding vessels;  Surgeon: Almond Lint, MD;  Location: [facility];  Service: General;  Laterality: N/A;   TENDON REPAIR     2006-lt index   WART FULGURATION  07/06/2011   Procedure: FULGURATION ANAL WART;  Surgeon: Shelly Rubenstein, MD;  Location: [facility];  Service: General;  Laterality: N/A;  excision anal condyloma   WART FULGURATION N/A 04/23/2014   Procedure: EXCISION OF ANAL CONDYLOMA;  Surgeon: Abigail Miyamoto, MD;  Location: [facility];  Service: General;  Laterality: N/A;    Family Hx: Family History  Problem Relation Age of Onset   Cancer Mother        brain   Cancer Sister        breast    Social Hx: Current Social History   (Please include date ( .td) when updating information )  Who lives at home: *** 06/03/2021  Who would speak for you about health care matters: *** 06/03/2021  Transportation: *** 06/03/2021 Important Relationships & Pets: *** 06/03/2021  Current Stressors: *** 06/03/2021 Work / Education:  *** 06/03/2021 Religious / Personal Beliefs: *** 06/03/2021 Interests / Fun: *** 06/03/2021 Other: *** 06/03/2021   Medications:   ROS: Woman:  Patient  reports no  vision/ hearing changes,anorexia, weight change, fever ,adenopathy, persistant / recurrent hoarseness, swallowing issues, chest pain, edema,persistant / recurrent cough, hemoptysis, dyspnea(rest, exertional, paroxysmal nocturnal), gastrointestinal  bleeding (melena, rectal bleeding), abdominal pain, excessive heart burn, GU symptoms(dysuria, hematuria, pyuria, voiding/incontinence  Issues) syncope, focal weakness, severe memory loss, concerning skin lesions, depression, anxiety, abnormal bruising/bleeding, major joint swelling, breast masses or abnormal vaginal bleeding.    Man:  Patient reports no  vision/ hearing changes,anorexia, weight change, fever ,adenopathy, persistant / recurrent hoarseness, swallowing issues, chest pain, edema,persistant / recurrent cough, hemoptysis, dyspnea(rest, exertional, paroxysmal nocturnal), gastrointestinal  bleeding (melena, rectal bleeding), abdominal pain, excessive heart burn, GU symptoms(dysuria, hematuria, pyuria, voiding/incontinence  Issues) syncope, focal weakness, severe memory loss, concerning skin lesions, depression, anxiety, abnormal bruising/bleeding, major joint swelling.    Preventative Screening Colonoscopy: year*** results *** Mammogram: year*** results *** Pap test: year*** results *** PSA: year*** results *** DEXA: year*** results *** Tetanus vaccine: year*** results *** Pneumonia vaccine: year*** results *** Shingles vaccine: year*** results *** Heart stress test: year*** results *** Echocardiogram: year*** results *** Xrays: year*** results *** CT/MRI: year*** results ***  Smoking status reviewed  ROS: pertinent noted in the HPI    Objective:  There were no vitals taken for this visit. Vitals and nursing note reviewed  General: NAD, pleasant, able to participate in exam HEENT: normocephalic, TM's visualized bilaterally, no scleral icterus or conjunctival pallor, no  nasal discharge, moist mucous membranes, good  dentition without erythema or discharge noted in posterior oropharynx Neck: supple, non-tender, without lymphadenopathy Cardiac: RRR, S1 S2 present. normal heart sounds, no murmurs. Respiratory: CTAB, normal effort, No wheezes, rales or rhonchi Abdomen: Normoactive bowel sounds, non-tender, non-distended, no hepatosplenomegaly Extremities: no edema or cyanosis. Skin: warm and dry, no rashes noted Neuro: alert, no obvious focal deficits Psych: Normal affect and mood  Assessment & Plan:  No problem-specific Assessment & Plan notes found for this encounter.   No orders of the defined types were placed in this encounter.  No orders of the defined types were placed in this encounter.  No follow-ups on file. Shelby Mattocks, DO 06/03/2021, 7:39 AM PGY-***, [facility]

## 2021-06-10 ENCOUNTER — Ambulatory Visit: Payer: Medicare Other | Admitting: Nurse Practitioner

## 2021-06-17 ENCOUNTER — Other Ambulatory Visit: Payer: Self-pay

## 2021-06-17 ENCOUNTER — Telehealth: Payer: Self-pay

## 2021-06-17 DIAGNOSIS — B20 Human immunodeficiency virus [HIV] disease: Secondary | ICD-10-CM

## 2021-06-17 NOTE — Telephone Encounter (Signed)
Attempted to call patient after received refill request for 90 day supply for Symtuza. Patient missed last appointment with Dr. Drue Second and will need to reschedule. Left voicemail requesting patient call office back. Will need confirm patient's pharamcy. Refill request from [facility] on [address] refill. Last refill sent to [facility] on [address]  Juanita Laster, RMA

## 2021-06-21 ENCOUNTER — Telehealth: Payer: Self-pay

## 2021-06-21 NOTE — Telephone Encounter (Signed)
LVM for Patient to return call  RE: Schedule AWV with [facility] with Valda Christenson CMA  Please transfer patient when if they return the call  

## 2021-07-20 ENCOUNTER — Ambulatory Visit (INDEPENDENT_AMBULATORY_CARE_PROVIDER_SITE_OTHER): Payer: Medicare Other | Admitting: Podiatry

## 2021-07-20 DIAGNOSIS — Z91199 Patient's noncompliance with other medical treatment and regimen due to unspecified reason: Secondary | ICD-10-CM

## 2021-07-20 NOTE — Progress Notes (Signed)
No show

## 2022-01-27 ENCOUNTER — Telehealth: Payer: Self-pay

## 2022-01-27 NOTE — Telephone Encounter (Signed)
-----  Message from Beryle Flock, RN sent at 01/26/2022 10:33 AM EST ----- Patient left voicemail in triage requesting call back to get scheduled with Dr. Baxter Flattery in March. Phone number listed in chart. Thanks!  [name]

## 2022-01-27 NOTE — Telephone Encounter (Signed)
Left patient a voice mail to call back to schedule office visit with Dr. Baxter Flattery

## 2022-03-20 ENCOUNTER — Ambulatory Visit: Payer: Medicare Other | Admitting: Internal Medicine

## 2022-04-12 ENCOUNTER — Telehealth: Payer: Self-pay | Admitting: Radiation Oncology

## 2022-04-12 NOTE — Telephone Encounter (Signed)
4/3 @ 11:43 am Called Dr. Peter Congo office spoke to [place]. Referral incomplete still need path report, PET/CT images report, any previous treatment records, if done, also requested images to be powershare and/or cd sent. Received reports and attached to pt's timeline.

## 2022-04-17 ENCOUNTER — Other Ambulatory Visit: Payer: Self-pay

## 2022-04-17 ENCOUNTER — Other Ambulatory Visit: Payer: Self-pay | Admitting: Radiation Oncology

## 2022-04-17 ENCOUNTER — Inpatient Hospital Stay
Admission: RE | Admit: 2022-04-17 | Discharge: 2022-04-17 | Disposition: A | Discharge status: Home / Self Care | Payer: Self-pay | Source: Ambulatory Visit | Attending: Radiation Oncology | Admitting: Radiation Oncology

## 2022-04-17 DIAGNOSIS — C099 Malignant neoplasm of tonsil, unspecified: Secondary | ICD-10-CM

## 2022-04-17 DIAGNOSIS — C760 Malignant neoplasm of head, face and neck: Secondary | ICD-10-CM

## 2022-04-18 ENCOUNTER — Other Ambulatory Visit: Payer: Self-pay

## 2022-04-18 DIAGNOSIS — C760 Malignant neoplasm of head, face and neck: Secondary | ICD-10-CM

## 2022-04-20 ENCOUNTER — Ambulatory Visit: Payer: Medicare Other | Admitting: Internal Medicine

## 2022-04-21 ENCOUNTER — Telehealth: Payer: Self-pay | Admitting: Radiation Oncology

## 2022-04-21 ENCOUNTER — Encounter: Payer: Self-pay | Admitting: Radiation Oncology

## 2022-04-21 ENCOUNTER — Telehealth: Payer: Self-pay

## 2022-04-21 ENCOUNTER — Ambulatory Visit: Payer: Medicare Other

## 2022-04-21 ENCOUNTER — Emergency Department (HOSPITAL_COMMUNITY): Payer: Medicare Other

## 2022-04-21 ENCOUNTER — Encounter (HOSPITAL_COMMUNITY): Payer: Self-pay

## 2022-04-21 ENCOUNTER — Ambulatory Visit
Admission: RE | Admit: 2022-04-21 | Discharge: 2022-04-21 | Disposition: A | Discharge status: Home / Self Care | Payer: Medicare Other | Source: Ambulatory Visit | Attending: Radiation Oncology | Admitting: Radiation Oncology

## 2022-04-21 ENCOUNTER — Inpatient Hospital Stay (HOSPITAL_COMMUNITY)
Admission: EM | Admit: 2022-04-21 | Discharge: 2022-04-25 | DRG: 147 | Disposition: A | Discharge status: Home / Self Care | Payer: Medicare Other | Attending: Internal Medicine | Admitting: Internal Medicine

## 2022-04-21 ENCOUNTER — Other Ambulatory Visit: Payer: Self-pay

## 2022-04-21 DIAGNOSIS — C770 Secondary and unspecified malignant neoplasm of lymph nodes of head, face and neck: Secondary | ICD-10-CM | POA: Diagnosis present

## 2022-04-21 DIAGNOSIS — C090 Malignant neoplasm of tonsillar fossa: Secondary | ICD-10-CM

## 2022-04-21 DIAGNOSIS — J989 Respiratory disorder, unspecified: Secondary | ICD-10-CM

## 2022-04-21 DIAGNOSIS — Z981 Arthrodesis status: Secondary | ICD-10-CM | POA: Diagnosis not present

## 2022-04-21 DIAGNOSIS — R1310 Dysphagia, unspecified: Secondary | ICD-10-CM | POA: Diagnosis present

## 2022-04-21 DIAGNOSIS — D638 Anemia in other chronic diseases classified elsewhere: Secondary | ICD-10-CM | POA: Diagnosis present

## 2022-04-21 DIAGNOSIS — B20 Human immunodeficiency virus [HIV] disease: Secondary | ICD-10-CM | POA: Diagnosis not present

## 2022-04-21 DIAGNOSIS — D179 Benign lipomatous neoplasm, unspecified: Secondary | ICD-10-CM | POA: Diagnosis present

## 2022-04-21 DIAGNOSIS — J988 Other specified respiratory disorders: Secondary | ICD-10-CM | POA: Diagnosis present

## 2022-04-21 DIAGNOSIS — R221 Localized swelling, mass and lump, neck: Secondary | ICD-10-CM | POA: Diagnosis present

## 2022-04-21 DIAGNOSIS — Z79899 Other long term (current) drug therapy: Secondary | ICD-10-CM

## 2022-04-21 DIAGNOSIS — Z882 Allergy status to sulfonamides status: Secondary | ICD-10-CM

## 2022-04-21 DIAGNOSIS — Z7982 Long term (current) use of aspirin: Secondary | ICD-10-CM | POA: Diagnosis not present

## 2022-04-21 DIAGNOSIS — C099 Malignant neoplasm of tonsil, unspecified: Principal | ICD-10-CM | POA: Diagnosis present

## 2022-04-21 DIAGNOSIS — F419 Anxiety disorder, unspecified: Secondary | ICD-10-CM | POA: Diagnosis present

## 2022-04-21 DIAGNOSIS — F17210 Nicotine dependence, cigarettes, uncomplicated: Secondary | ICD-10-CM | POA: Diagnosis present

## 2022-04-21 DIAGNOSIS — Z91013 Allergy to seafood: Secondary | ICD-10-CM | POA: Diagnosis not present

## 2022-04-21 DIAGNOSIS — Z881 Allergy status to other antibiotic agents status: Secondary | ICD-10-CM | POA: Diagnosis not present

## 2022-04-21 DIAGNOSIS — Z716 Tobacco abuse counseling: Secondary | ICD-10-CM

## 2022-04-21 DIAGNOSIS — E871 Hypo-osmolality and hyponatremia: Secondary | ICD-10-CM | POA: Diagnosis present

## 2022-04-21 DIAGNOSIS — C4492 Squamous cell carcinoma of skin, unspecified: Secondary | ICD-10-CM

## 2022-04-21 DIAGNOSIS — D649 Anemia, unspecified: Secondary | ICD-10-CM | POA: Insufficient documentation

## 2022-04-21 LAB — CBC WITH DIFFERENTIAL/PLATELET
Abs Immature Granulocytes: 0.03 10*3/uL (ref 0.00–0.07)
Basophils Absolute: 0 10*3/uL (ref 0.0–0.1)
Basophils Relative: 0 %
Eosinophils Absolute: 0.1 10*3/uL (ref 0.0–0.5)
Eosinophils Relative: 1 %
HCT: 31.6 % — ABNORMAL LOW (ref 39.0–52.0)
Hemoglobin: 10.2 g/dL — ABNORMAL LOW (ref 13.0–17.0)
Immature Granulocytes: 0 %
Lymphocytes Relative: 21 %
Lymphs Abs: 1.8 10*3/uL (ref 0.7–4.0)
MCH: 26.5 pg (ref 26.0–34.0)
MCHC: 32.3 g/dL (ref 30.0–36.0)
MCV: 82.1 fL (ref 80.0–100.0)
Monocytes Absolute: 0.7 10*3/uL (ref 0.1–1.0)
Monocytes Relative: 9 %
Neutro Abs: 5.8 10*3/uL (ref 1.7–7.7)
Neutrophils Relative %: 69 %
Platelets: 418 10*3/uL — ABNORMAL HIGH (ref 150–400)
RBC: 3.85 MIL/uL — ABNORMAL LOW (ref 4.22–5.81)
RDW: 14.3 % (ref 11.5–15.5)
WBC: 8.5 10*3/uL (ref 4.0–10.5)
nRBC: 0 % (ref 0.0–0.2)

## 2022-04-21 LAB — COMPREHENSIVE METABOLIC PANEL
ALT: 21 U/L (ref 0–44)
AST: 31 U/L (ref 15–41)
Albumin: 3.9 g/dL (ref 3.5–5.0)
Alkaline Phosphatase: 44 U/L (ref 38–126)
Anion gap: 9 (ref 5–15)
BUN: 16 mg/dL (ref 8–23)
CO2: 24 mmol/L (ref 22–32)
Calcium: 8.8 mg/dL — ABNORMAL LOW (ref 8.9–10.3)
Chloride: 94 mmol/L — ABNORMAL LOW (ref 98–111)
Creatinine, Ser: 1.1 mg/dL (ref 0.61–1.24)
GFR, Estimated: >60 mL/min (ref >60)
Glucose, Bld: 108 mg/dL — ABNORMAL HIGH (ref 70–99)
Potassium: 4 mmol/L (ref 3.5–5.1)
Sodium: 127 mmol/L — ABNORMAL LOW (ref 135–145)
Total Bilirubin: 0.6 mg/dL (ref 0.3–1.2)
Total Protein: 7.4 g/dL (ref 6.5–8.1)

## 2022-04-21 MED ORDER — HYDROMORPHONE HCL 1 MG/ML IJ SOLN
1.0000 mg | Freq: Once | INTRAMUSCULAR | Status: AC
  Administered 2022-04-21: 1 mg via INTRAVENOUS
  Filled 2022-04-21: qty 1

## 2022-04-21 MED ORDER — HEPARIN SODIUM (PORCINE) 5000 UNIT/ML IJ SOLN
5000.0000 [IU] | Freq: Three times a day (TID) | INTRAMUSCULAR | Status: DC
  Administered 2022-04-22: 5000 [IU] via SUBCUTANEOUS
  Filled 2022-04-21 (×5): qty 1

## 2022-04-21 MED ORDER — DOCUSATE SODIUM 100 MG PO CAPS: 100.0000 mg | ORAL_CAPSULE | Freq: Two times a day (BID) | ORAL | Status: DC | PRN

## 2022-04-21 MED ORDER — DEXAMETHASONE SODIUM PHOSPHATE 10 MG/ML IJ SOLN
10.0000 mg | Freq: Once | INTRAMUSCULAR | Status: AC
  Administered 2022-04-21: 10 mg via INTRAVENOUS
  Filled 2022-04-21: qty 1

## 2022-04-21 MED ORDER — SODIUM CHLORIDE 0.9 % IV BOLUS
500.0000 mL | Freq: Once | INTRAVENOUS | Status: AC
  Administered 2022-04-21: 500 mL via INTRAVENOUS

## 2022-04-21 MED ORDER — IOHEXOL 300 MG/ML  SOLN
75.0000 mL | Freq: Once | INTRAMUSCULAR | Status: AC | PRN
  Administered 2022-04-21: 75 mL via INTRAVENOUS

## 2022-04-21 MED ORDER — MORPHINE SULFATE (PF) 4 MG/ML IV SOLN
4.0000 mg | Freq: Once | INTRAVENOUS | Status: AC
  Administered 2022-04-21: 4 mg via INTRAVENOUS
  Filled 2022-04-21: qty 1

## 2022-04-21 MED ORDER — POLYETHYLENE GLYCOL 3350 17 G PO PACK: 17.0000 g | PACK | Freq: Every day | ORAL | Status: DC | PRN

## 2022-04-21 NOTE — Telephone Encounter (Signed)
Rn was able to talk to pt after second phone call (after missing his consult appt today, due to not feeling well). He stated he was having severe throat pain, worse than he has been having. Rn asked him if he was short of breath due to this throat swelling and he stated yes he was at times. He sounded short of breath over the phone. RN strongly encouraged him to come the emergency room to be seen do to these severe symptoms. He stated someone could bring him around 3pm. Rn also told pt to call 911 and get EMS to home if needed as well. Rn called ED at [facility] to inform them that the pt would be coming in for evaluation.

## 2022-04-21 NOTE — Telephone Encounter (Signed)
Rn Victorino Dike attempted to call pt concerning his missed consult appointment (due to him not feeling well). Rn got no answer and voice mail was left encouraging pt to seek medical attention if not feeling well. Rn will attempt to call back later to check on pt.

## 2022-04-21 NOTE — ED Triage Notes (Signed)
Recently diagnosed with throat cancer, tx has not started yet. Large mass to left side of neck that has been getting worse daily. Pt states he is able to eat and drink and manage his secretions. At times, he drools. C/o SOB all the time and intermittent mid sternal chest pain that is worse when coughing.

## 2022-04-21 NOTE — Progress Notes (Addendum)
Dr. 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  He was scheduled for a consultation with me today but cancelled.  I reviewed his CT scan (of neck) from the ED and recommended he be admitted and that ENT be consulted to consider placing a tracheostomy if they feel it is necessary to protect his airway.  I also let Dr. Wilkie Aye know that dexamethasone can be helpful to reduce tumor related swelling temporarily.  If his airway is truly compromised, emergent radiation without tracheostomy could cause initial peritumoral edema and  potentially worsen his symptoms.  I will alert our team of his likely admission so that he is seen on Monday - we may need to plan his radiation prior to his PET scan depending on his clinical status at that time. His cancer is incompletely staged. Induction chemotherapy may also be a consideration.  -----------------------------------  [name] Peak, MD

## 2022-04-21 NOTE — Telephone Encounter (Signed)
Pt called asking to r/s CON today. Pt states he is not feeling well; symptoms of neck swelling, headache, and unable to determine if he has fever. Pt was r/s to 4/17 for CON.

## 2022-04-21 NOTE — ED Provider Notes (Addendum)
[place] EMERGENCY DEPARTMENT AT [facility] Provider Note   CSN: [id number] Arrival date & time: 04/21/22  1717     History  Chief Complaint  Patient presents with   Neck Swelling    Cancer    [name] is a 62 y.o. male.  HPI   62 year old male with newly diagnosed left-sided neck mass but tested positive for malignancy presents the emergency department with worsening swelling of the left side of his neck.  Patient was reportedly temporarily in [place] when the neck swelling started.  He was evaluated back in February with imaging that showed left-sided tonsillar mass.  He underwent biopsy and was initially scheduled with radiation oncology in [place] however he moved back here to [facility] for family support.  He is now being transition to our local radiation oncologist.  Patient presents today with worsening neck swelling, pain, difficulty swallowing, shortness of breath.  He is still able to swallow and control his secretions but states he does drool at times and that the left side of the neck is continuing to swell.  Home Medications Prior to Admission medications   Medication Sig Start Date End Date Taking? Authorizing Provider  aspirin EC 81 MG tablet Take 81 mg by mouth daily.    [provider]  baclofen (LIORESAL) 10 MG tablet Take 20 mg by mouth 3 (three) times daily.  Patient not taking: Reported on 09/22/2019 02/16/18   [provider]  baclofen (LIORESAL) 20 MG tablet Take 20 mg by mouth 3 (three) times daily. Patient not taking: Reported on 03/01/2021 06/16/19   [provider]  buPROPion (WELLBUTRIN XL) 300 MG 24 hr tablet  06/13/19   [provider]  Darunavir-Cobicistat-Emtricitabine-Tenofovir Alafenamide (SYMTUZA) 800-150-200-10 MG TABS Take 1 tablet by mouth daily with breakfast. 03/01/21   [name], MD  fluconazole (DIFLUCAN) 150 MG tablet Take 1 tablet (150 mg total) by mouth once a week. Patient not  taking: Reported on 08/28/2019 06/26/19   Park Liter, DPM  gabapentin (NEURONTIN) 300 MG capsule Take 1 capsule (300 mg total) by mouth 3 (three) times daily. Patient not taking: Reported on 03/01/2021 04/11/18   Malvin Johns, MD  gabapentin (NEURONTIN) 800 MG tablet Take 800 mg by mouth 3 (three) times daily. 02/12/21   [provider]  HYDROcodone-acetaminophen (NORCO/VICODIN) 5-325 MG tablet Take 1 tablet by mouth every 6 (six) hours as needed. 05/23/21   Charlynne Pander, MD  hydrOXYzine (ATARAX/VISTARIL) 25 MG tablet Take 1 tablet (25 mg total) by mouth every 6 (six) hours as needed (anxiety/agitation or CIWA < or = 10). 08/03/17   Starkes-Perry, Juel Burrow, FNP  losartan (COZAAR) 25 MG tablet Take 25 mg by mouth daily. 11/25/20   [provider]  pantoprazole (PROTONIX) 40 MG tablet Take 40 mg by mouth daily. Patient not taking: Reported on 03/01/2021    [provider]  terbinafine (LAMISIL) 250 MG tablet Take 1 tablet (250 mg total) by mouth daily. Patient not taking: Reported on 03/01/2021 02/24/20   Park Liter, DPM  tiZANidine (ZANAFLEX) 4 MG tablet Take 4 mg by mouth 2 (two) times daily. 11/23/20   [provider]  topiramate (TOPAMAX) 100 MG tablet Take 1 tablet (100 mg total) by mouth 3 (three) times daily. Patient not taking: Reported on 09/22/2019 04/11/18   Malvin Johns, MD      Allergies    Shellfish allergy, Sulfa antibiotics, Fish allergy, and Clindamycin    Review of Systems  Review of Systems  Constitutional:  Positive for appetite change.  HENT:  Positive for drooling, sore throat, trouble swallowing and voice change.   Respiratory:  Negative for shortness of breath.   Cardiovascular:  Negative for chest pain.  Musculoskeletal:        + neck pain and swelling  Neurological:  Negative for dizziness.    Physical Exam Updated Vital Signs BP (!) 157/87 (BP Location: Left Arm)   Pulse 81   Temp 97.7 F (36.5 C) (Oral)   Resp 18    Ht 6' (1.829 m)   Wt 74.8 kg   SpO2 98%   BMI 22.38 kg/m  Physical Exam Vitals and nursing note reviewed.  Constitutional:      Appearance: Normal appearance. He is ill-appearing.  HENT:     Head: Normocephalic.     Nose: Nose normal.     Mouth/Throat:     Mouth: Mucous membranes are dry.     Comments: Swelling of the soft palate more on the left with slight deviation of the uvula Neck:     Comments: Noticeable left-sided submandibular swelling that is firm and TTP.  No stridor or active drooling Cardiovascular:     Rate and Rhythm: Normal rate.  Pulmonary:     Effort: Pulmonary effort is normal. No respiratory distress.     Breath sounds: No stridor.  Abdominal:     Palpations: Abdomen is soft.     Tenderness: There is no abdominal tenderness.  Lymphadenopathy:     Cervical: Cervical adenopathy present.  Skin:    General: Skin is warm.  Neurological:     Mental Status: He is alert and oriented to person, place, and time. Mental status is at baseline.  Psychiatric:        Mood and Affect: Mood normal.     ED Results / Procedures / Treatments   Labs (all labs ordered are listed, but only abnormal results are displayed) Labs Reviewed  CBC WITH DIFFERENTIAL/PLATELET  COMPREHENSIVE METABOLIC PANEL    EKG EKG Interpretation  Date/Time:  Friday April 21 2022 17:29:11 EDT Ventricular Rate:  89 PR Interval:  113 QRS Duration: 92 QT Interval:  377 QTC Calculation: 459 R Axis:   69 Text Interpretation: Sinus rhythm Borderline short PR interval Probable left atrial enlargement Left ventricular hypertrophy Confirmed by Coralee Pesa [phone]) on 04/21/2022 6:13:59 PM  Radiology No results found.  Procedures .Critical Care  Performed by: Rozelle Logan, DO Authorized by: Rozelle Logan, DO   Critical care provider statement:    Critical care time (minutes):  30   Critical care time was exclusive of:  Separately billable procedures and treating other patients    Critical care was necessary to treat or prevent imminent or life-threatening deterioration of the following conditions:  Respiratory failure   Critical care was time spent personally by me on the following activities:  Development of treatment plan with patient or surrogate, discussions with consultants, evaluation of patient's response to treatment, examination of patient, ordering and review of laboratory studies, ordering and review of radiographic studies, ordering and performing treatments and interventions, pulse oximetry, re-evaluation of patient's condition and review of old charts     Medications Ordered in ED Medications  sodium chloride 0.9 % bolus 500 mL (500 mLs Intravenous New Bag/Given 04/21/22 1829)  morphine (PF) 4 MG/ML injection 4 mg (4 mg Intravenous Given 04/21/22 1828)    ED Course/ Medical Decision Making/ A&P  Medical Decision Making Amount and/or Complexity of Data Reviewed Labs: ordered. Radiology: ordered.  Risk Prescription drug management. Decision regarding hospitalization.   62 year old male presents emergency department with worse neck mass swelling, difficulty swallowing, change in voice, episodes of drooling.  Vitals are stable on arrival, he has noticeable neck swelling and swelling of the soft palate with uvular deviation.  He is breathing easy, speaking in full sentences, controlling his secretions.  With coughing he has intermittent stridor but no stridor at rest.  Blood work shows hyponatremia and mild anemia.  CT identifies large neck mass.  Consulted Dr. Basilio Cairo, radiation oncology of who he was supposed to see today in office.  She reviewed the imaging.  He is not amendable to immediate radiation given the massive size.  She recommends Decadron and ENT evaluation for possible trach.  Spoke with on-call ENT doctor, Dr. Jearld Fenton.  He recommends transfer to [facility] and admission to ICU for airway watch.  I agree given  worsening symptoms. He will evaluate the patient when he gets to [facility] for potential trach.  Patient has been updated on this plan he agrees.  Patients evaluation and results requires admission for further treatment and care.  Spoke with hospitalist, reviewed patient's ED course and they accept admission.  Patient agrees with admission plan, offers no new complaints and is stable/unchanged at time of admit.        Final Clinical Impression(s) / ED Diagnoses Final diagnoses:  None    Rx / DC Orders ED Discharge Orders     None             Rozelle Logan, DO 04/22/22 0025

## 2022-04-22 ENCOUNTER — Inpatient Hospital Stay (HOSPITAL_COMMUNITY): Payer: Medicare Other

## 2022-04-22 DIAGNOSIS — J988 Other specified respiratory disorders: Secondary | ICD-10-CM | POA: Diagnosis not present

## 2022-04-22 LAB — CBC
HCT: 33.3 % — ABNORMAL LOW (ref 39.0–52.0)
Hemoglobin: 11.2 g/dL — ABNORMAL LOW (ref 13.0–17.0)
MCH: 27.5 pg (ref 26.0–34.0)
MCHC: 33.6 g/dL (ref 30.0–36.0)
MCV: 81.8 fL (ref 80.0–100.0)
Platelets: 413 10*3/uL — ABNORMAL HIGH (ref 150–400)
RBC: 4.07 MIL/uL — ABNORMAL LOW (ref 4.22–5.81)
RDW: 14.4 % (ref 11.5–15.5)
WBC: 5.3 10*3/uL (ref 4.0–10.5)
nRBC: 0 % (ref 0.0–0.2)

## 2022-04-22 LAB — BASIC METABOLIC PANEL
Anion gap: 10 (ref 5–15)
BUN: 11 mg/dL (ref 8–23)
CO2: 26 mmol/L (ref 22–32)
Calcium: 9.2 mg/dL (ref 8.9–10.3)
Chloride: 94 mmol/L — ABNORMAL LOW (ref 98–111)
Creatinine, Ser: 0.83 mg/dL (ref 0.61–1.24)
GFR, Estimated: >60 mL/min (ref >60)
Glucose, Bld: 123 mg/dL — ABNORMAL HIGH (ref 70–99)
Potassium: 4.4 mmol/L (ref 3.5–5.1)
Sodium: 130 mmol/L — ABNORMAL LOW (ref 135–145)

## 2022-04-22 LAB — MRSA NEXT GEN BY PCR, NASAL: MRSA by PCR Next Gen: NOT DETECTED

## 2022-04-22 LAB — MAGNESIUM: Magnesium: 2.3 mg/dL (ref 1.7–2.4)

## 2022-04-22 MED ORDER — BACLOFEN 20 MG PO TABS: 20.0000 mg | ORAL_TABLET | Freq: Three times a day (TID) | ORAL | Status: DC | PRN

## 2022-04-22 MED ORDER — DARUN-COBIC-EMTRICIT-TENOFAF 800-150-200-10 MG PO TABS
1.0000 | ORAL_TABLET | Freq: Every day | ORAL | Status: DC
  Administered 2022-04-23 – 2022-04-25 (×3): 1 via ORAL
  Filled 2022-04-22 (×4): qty 1

## 2022-04-22 MED ORDER — GABAPENTIN 400 MG PO CAPS
800.0000 mg | ORAL_CAPSULE | Freq: Three times a day (TID) | ORAL | Status: DC
  Administered 2022-04-22 – 2022-04-25 (×9): 800 mg via ORAL
  Filled 2022-04-22 (×9): qty 2

## 2022-04-22 MED ORDER — HYDROMORPHONE HCL 1 MG/ML IJ SOLN
1.0000 mg | INTRAMUSCULAR | Status: DC | PRN
  Administered 2022-04-22: 1 mg via INTRAVENOUS
  Filled 2022-04-22: qty 1

## 2022-04-22 MED ORDER — MORPHINE SULFATE (PF) 4 MG/ML IV SOLN
4.0000 mg | Freq: Once | INTRAVENOUS | Status: AC
  Administered 2022-04-22: 4 mg via INTRAVENOUS
  Filled 2022-04-22: qty 1

## 2022-04-22 MED ORDER — ROSUVASTATIN CALCIUM 5 MG PO TABS
5.0000 mg | ORAL_TABLET | Freq: Every day | ORAL | Status: DC
  Administered 2022-04-22 – 2022-04-25 (×4): 5 mg via ORAL
  Filled 2022-04-22 (×4): qty 1

## 2022-04-22 MED ORDER — DEXAMETHASONE SODIUM PHOSPHATE 10 MG/ML IJ SOLN
6.0000 mg | Freq: Every day | INTRAMUSCULAR | Status: DC
  Administered 2022-04-22 – 2022-04-25 (×4): 6 mg via INTRAVENOUS
  Filled 2022-04-22: qty 1
  Filled 2022-04-22: qty 0.6
  Filled 2022-04-22 (×2): qty 1

## 2022-04-22 MED ORDER — CLONIDINE HCL 0.1 MG PO TABS
0.1000 mg | ORAL_TABLET | Freq: Two times a day (BID) | ORAL | Status: DC
  Administered 2022-04-22 – 2022-04-25 (×7): 0.1 mg via ORAL
  Filled 2022-04-22 (×7): qty 1

## 2022-04-22 MED ORDER — ASPIRIN 81 MG PO CHEW
81.0000 mg | CHEWABLE_TABLET | Freq: Every day | ORAL | Status: DC
  Administered 2022-04-22 – 2022-04-25 (×4): 81 mg via ORAL
  Filled 2022-04-22 (×4): qty 1

## 2022-04-22 MED ORDER — HYDROCODONE-ACETAMINOPHEN 5-325 MG PO TABS
1.0000 | ORAL_TABLET | Freq: Four times a day (QID) | ORAL | Status: DC | PRN
  Administered 2022-04-22 – 2022-04-23 (×4): 1 via ORAL
  Filled 2022-04-22 (×4): qty 1

## 2022-04-22 MED ORDER — ORAL CARE MOUTH RINSE: 15.0000 mL | OROMUCOSAL | Status: DC | PRN

## 2022-04-22 MED ORDER — CHLORHEXIDINE GLUCONATE CLOTH 2 % EX PADS
6.0000 | MEDICATED_PAD | Freq: Every day | CUTANEOUS | Status: DC
  Administered 2022-04-22: 6 via TOPICAL

## 2022-04-22 NOTE — H&P (Signed)
NAME:  [name], MRN:  [id number], DOB:  [birth year], LOS: 1 ADMISSION DATE:  04/21/2022 CONSULTATION DATE:  04/22/2022 REFERRING MD:  Wilkie Aye - EDP CHIEF COMPLAINT:  Airway compromise   History of Present Illness:  62 year old man who presented to [facility] ED for SOB, L neck pain/swelling. 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 Basilio Cairo). Patient recently relocated to [facility] from [place]; initial plan was to see surgical oncology in [facility] for consideration of surgical options.  Patient reports significantly increased L neck/jawline swelling that is TTP. It is firm and nonmobile. R posterior nec with soft, mobile mass more c/w lipoma. Patient states that he is able to eat regular food and drink without issues; denies swallow difficulty (though grimaces slightly when asked to swallow). Reports pain on the side of his tongue that feels like sensitivity, most notable with chewing. He states that he continues to eat regular food and would not ever want a feeding tube. He also brings up tracheostomy as something he would never want. Despite this, patient was transferred from [facility] to [facility] for further care, ENT evaluation and possible tracheostomy. He is hopeful that he will be able to get radiation/chemo to shrink the tumor.  PCCM consulted for ICU admission in the setting of threatened airway.  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 Recent diagnosis of H&N CA, due to establish with Dr. Basilio Cairo on date of admission. 4/13 - PCCM consulted for  admission, transfer from [facility] to [facility] for possible trach with ENT  Interim History / Subjective:  PCCM consulted for ICU admission, transfer from [facility] to [facility]  Objective:  Blood pressure 126/83, pulse 80, temperature 97.7 F (36.5 C), temperature source Oral, resp. rate 18, height 6' (1.829 m), weight 74.8 kg, SpO2 97 %.        Intake/Output Summary (Last 24 hours) at 04/22/2022 0013 Last data filed at 04/21/2022 1854 Gross per 24 hour  Intake 500 ml  Output --  Net 500 ml   Filed Weights   04/21/22 1729  Weight: 74.8 kg   Physical Examination: General: Overall well-appearing middle-aged man in NAD. Pleasant and conversant. HEENT: [place]/AT, anicteric sclera, PERRL, moist mucous membranes; mild boggy-appearing swelling of soft palate, L > R, no bleeding/ulceration. Neck: Large, firm, nonmobile mass to anterolateral L neck, TTP. R posterolateral lump soft and mobile, c/w lipoma. Neuro: Awake, oriented x 4. Responds to verbal stimuli. Following commands consistently. Moves all 4 extremities spontaneously.  CV: RRR, no m/g/r. PULM: Breathing even and unlabored on RA. Lung fields CTAB. GI: Soft, nontender, nondistended. Normoactive bowel sounds. Extremities: No LE edema noted. Skin: Warm/dry, no rashes.  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 Recent diagnosis of L tonsillar mass, s/p biopsy while still living in [place]. Initially scheduled with Rad-Onc in [place] but relocated to [facility] for better social support; in the process of establishing care with Dr. Basilio Cairo. - Admit to ICU as transfer from [facility] for airway  watch - ENT consulted, plan to see at [facility] for possible tracheostomy - On brief discussion of this at time of transfer, patient does not desire tracheostomy - F/u with Dr. Basilio Cairo (Rad Onc) for planning/management - Close monitoring of airway while in-house - SLP as appropriate  to assess swallow  HIV, well-controlled - Continue home Symtuza - F/u CD4 count, viral load  Cervical spine stenosis - Supportive care - Pain medications PRN - PT/OT as indicated  Best Practice: (right click and "Reselect all SmartList Selections" daily)   Diet/type: NPO, bedside swallow eval prior to any PO intake DVT prophylaxis: prophylactic heparin  GI prophylaxis: N/A Lines: N/A Foley:  N/A Code Status:  full code Last date of multidisciplinary goals of care discussion [Pending, daughter at bedisde 4/13AM]  Labs:  CBC: Recent Labs  Lab 04/21/22 1957  WBC 8.5  NEUTROABS 5.8  HGB 10.2*  HCT 31.6*  MCV 82.1  PLT 418*   Basic Metabolic Panel: Recent Labs  Lab 04/21/22 1957  NA 127*  K 4.0  CL 94*  CO2 24  GLUCOSE 108*  BUN 16  CREATININE 1.10  CALCIUM 8.8*   GFR: Estimated Creatinine Clearance: 74.6 mL/min (by C-G formula based on SCr of 1.1 mg/dL). Recent Labs  Lab 04/21/22 1957  WBC 8.5   Liver Function Tests: Recent Labs  Lab 04/21/22 1957  AST 31  ALT 21  ALKPHOS 44  BILITOT 0.6  PROT 7.4  ALBUMIN 3.9   No results for input(s): "LIPASE", "AMYLASE" in the last 168 hours. No results for input(s): "AMMONIA" in the last 168 hours.  ABG:    Component Value Date/Time   HCO3 25.6 (H) 01/30/2007 0220   TCO2 26 06/27/2017 1336    Coagulation Profile: No results for input(s): "INR", "PROTIME" in the last 168 hours.  Cardiac Enzymes: No results for input(s): "CKTOTAL", "CKMB", "CKMBINDEX", "TROPONINI" in the last 168 hours.  HbA1C: Hgb A1c MFr Bld  Date/Time Value Ref Range Status  04/21/2018 06:27 AM 5.3 4.8 - 5.6 % Final    Comment:    (NOTE)         Prediabetes: 5.7 - 6.4         Diabetes: >6.4         Glycemic control for adults with diabetes: <7.0   06/27/2017 05:35 PM 6.0 (H) 4.8 - 5.6 % Final    Comment:    (NOTE) Pre diabetes:          5.7%-6.4% Diabetes:              >6.4% Glycemic control for   <7.0% adults with  diabetes    CBG: No results for input(s): "GLUCAP" in the last 168 hours.  Review of Systems:   Review of systems completed with pertinent positives/negatives outlined in above HPI.  Past Medical History:  He,  has a past medical history of Anal condyloma (05/22/2011), Hemorrhoids, HIV (human immunodeficiency virus infection), and Stenosis of cervical spine.   Surgical History:   Past Surgical History:  Procedure Laterality Date   ANTERIOR CERVICAL DECOMP/DISCECTOMY FUSION N/A 07/06/2017   Procedure: ANTERIOR CERVICAL DECOMPRESSION/DISCECTOMY FUSION C3-4/C4-5 2 LEVELS;  Surgeon: Lisbeth Renshaw, MD;  Location: [facility] OR;  Service: Neurosurgery;  Laterality: N/A;   HERNIA REPAIR  97   lt ing   RECTAL EXAM UNDER ANESTHESIA N/A 04/24/2014   Procedure:  ligation of bleeding vessels;  Surgeon: Almond Lint, MD;  Location: [facility] ORS;  Service: General;  Laterality: N/A;   TENDON REPAIR  2006-lt index   WART FULGURATION  07/06/2011   Procedure: FULGURATION ANAL WART;  Surgeon: Shelly Rubenstein, MD;  Location: [facility];  Service: General;  Laterality: N/A;  excision anal condyloma   WART FULGURATION N/A 04/23/2014   Procedure: EXCISION OF ANAL CONDYLOMA;  Surgeon: Abigail Miyamoto, MD;  Location: [facility] ORS;  Service: General;  Laterality: N/A;   Social History:   reports that he has been smoking cigarettes. He has a 12.50 pack-year smoking history. He has never used smokeless tobacco. He reports current alcohol use of about 24.0 standard drinks of alcohol per week. He reports current drug use. Drug: "Crack" cocaine.   Family History:  His family history includes Cancer in his mother and sister.   Allergies: Allergies  Allergen Reactions   Shellfish Allergy Anaphylaxis and Swelling    Swelling everywhere   Sulfa Antibiotics Anaphylaxis, Hives, Itching and Swelling    Swelling all over    Fish Allergy Swelling   Clindamycin Diarrhea   Home Medications: Prior to Admission  medications   Medication Sig Start Date End Date Taking? Authorizing Provider  aspirin EC 81 MG tablet Take 81 mg by mouth daily.    [provider]  baclofen (LIORESAL) 10 MG tablet Take 20 mg by mouth 3 (three) times daily.  Patient not taking: Reported on 09/22/2019 02/16/18   [provider]  baclofen (LIORESAL) 20 MG tablet Take 20 mg by mouth 3 (three) times daily. Patient not taking: Reported on 03/01/2021 06/16/19   [provider]  buPROPion (WELLBUTRIN XL) 300 MG 24 hr tablet  06/13/19   [provider]  Darunavir-Cobicistat-Emtricitabine-Tenofovir Alafenamide (SYMTUZA) 800-150-200-10 MG TABS Take 1 tablet by mouth daily with breakfast. 03/01/21   [name], MD  fluconazole (DIFLUCAN) 150 MG tablet Take 1 tablet (150 mg total) by mouth once a week. Patient not taking: Reported on 08/28/2019 06/26/19   Park Liter, DPM  gabapentin (NEURONTIN) 300 MG capsule Take 1 capsule (300 mg total) by mouth 3 (three) times daily. Patient not taking: Reported on 03/01/2021 04/11/18   Malvin Johns, MD  gabapentin (NEURONTIN) 800 MG tablet Take 800 mg by mouth 3 (three) times daily. 02/12/21   [provider]  HYDROcodone-acetaminophen (NORCO/VICODIN) 5-325 MG tablet Take 1 tablet by mouth every 6 (six) hours as needed. 05/23/21   Charlynne Pander, MD  hydrOXYzine (ATARAX/VISTARIL) 25 MG tablet Take 1 tablet (25 mg total) by mouth every 6 (six) hours as needed (anxiety/agitation or CIWA < or = 10). 08/03/17   Starkes-Perry, Juel Burrow, FNP  losartan (COZAAR) 25 MG tablet Take 25 mg by mouth daily. 11/25/20   [provider]  pantoprazole (PROTONIX) 40 MG tablet Take 40 mg by mouth daily. Patient not taking: Reported on 03/01/2021    [provider]  terbinafine (LAMISIL) 250 MG tablet Take 1 tablet (250 mg total) by mouth daily. Patient not taking: Reported on 03/01/2021 02/24/20   Park Liter, DPM  tiZANidine (ZANAFLEX) 4 MG tablet Take 4 mg by  mouth 2 (two) times daily. 11/23/20   [provider]  topiramate (TOPAMAX) 100 MG tablet Take 1 tablet (100 mg total) by mouth 3 (three) times daily. Patient not taking: Reported on 09/22/2019 04/11/18   Malvin Johns, MD    Critical care time: N/A   Faythe Ghee [place] 04/22/22 12:13 AM  Please see [email or web address] for pager details.  From 7A-7P if no response, please call [phone] After  hours, please call ELink [phone]

## 2022-04-22 NOTE — Consult Note (Signed)
Reason for Consult:tonsil cancer Referring Physician: Dr Lise Auer Calvillo is an 62 y.o. male.  HPI: hx of workup in [place] for about month of sore throat and tightlness in throat. 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 He has no airway problems. He has good voice. 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 He still is able to swallow.   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   Hemorrhoids    HIV (human immunodeficiency virus infection)    Stenosis of cervical spine    withb myelopathy    Past Surgical History:  Procedure Laterality Date   ANTERIOR CERVICAL DECOMP/DISCECTOMY FUSION N/A 07/06/2017   Procedure: ANTERIOR CERVICAL DECOMPRESSION/DISCECTOMY FUSION C3-4/C4-5 2 LEVELS;  Surgeon: Lisbeth Renshaw, MD;  Location: [facility];  Service: Neurosurgery;  Laterality: N/A;   HERNIA REPAIR  97   lt ing   RECTAL EXAM UNDER ANESTHESIA N/A 04/24/2014   Procedure:  ligation of bleeding vessels;  Surgeon: Almond Lint, MD;  Location: [facility];  Service: General;  Laterality: N/A;   TENDON REPAIR     2006-lt index   WART FULGURATION  07/06/2011   Procedure: FULGURATION ANAL WART;  Surgeon: Shelly Rubenstein, MD;  Location: [facility];  Service: General;  Laterality: N/A;  excision anal condyloma   WART FULGURATION N/A 04/23/2014   Procedure: EXCISION OF ANAL CONDYLOMA;  Surgeon: Abigail Miyamoto, MD;  Location: [facility];  Service: General;  Laterality: N/A;    Family History  Problem Relation Age of Onset   Cancer Mother        brain   Cancer Sister        breast    Social History:  reports that he has been smoking cigarettes. He has a 12.50 pack-year smoking history. He has never used smokeless tobacco. He reports current alcohol use of about 24.0 standard drinks of alcohol per week. He reports current drug use. Drug: "Crack" cocaine.  Allergies:  Allergies  Allergen  Reactions   Shellfish Allergy Anaphylaxis and Swelling    Swelling everywhere   Sulfa Antibiotics Anaphylaxis, Hives, Itching and Swelling    Swelling all over    Fish Allergy Swelling   Clindamycin Diarrhea    Medications: I have reviewed the patient's current medications.  Results for orders placed or performed during the hospital encounter of 04/21/22 (from the past 48 hour(s))  Comprehensive metabolic panel     Status: Abnormal   Collection Time: 04/21/22  7:57 PM  Result Value Ref Range   Sodium 127 (L) 135 - 145 mmol/L   Potassium 4.0 3.5 - 5.1 mmol/L   Chloride 94 (L) 98 - 111 mmol/L   CO2 24 22 - 32 mmol/L   Glucose, Bld 108 (H) 70 - 99 mg/dL    Comment: Glucose reference range applies only to samples taken after fasting for at least 8 hours.   BUN 16 8 - 23 mg/dL   Creatinine, Ser 1.61 0.61 - 1.24 mg/dL   Calcium 8.8 (L) 8.9 - 10.3 mg/dL   Total Protein 7.4 6.5 - 8.1 g/dL   Albumin 3.9 3.5 - 5.0 g/dL   AST 31 15 - 41 U/L   ALT 21 0 - 44 U/L   Alkaline Phosphatase 44 38 - 126 U/L   Total Bilirubin 0.6 0.3 - 1.2 mg/dL   GFR, Estimated >09 >60 mL/min  Comment: (NOTE) Calculated using the CKD-EPI Creatinine Equation (2021)    Anion gap 9 5 - 15    Comment: Performed at [facility], [address], [place], [place] [address]  CBC with Differential/Platelet     Status: Abnormal   Collection Time: 04/21/22  7:57 PM  Result Value Ref Range   WBC 8.5 4.0 - 10.5 K/uL   RBC 3.85 (L) 4.22 - 5.81 MIL/uL   Hemoglobin 10.2 (L) 13.0 - 17.0 g/dL   HCT 19.1 (L) 47.8 - 29.5 %   MCV 82.1 80.0 - 100.0 fL   MCH 26.5 26.0 - 34.0 pg   MCHC 32.3 30.0 - 36.0 g/dL   RDW 62.1 30.8 - 65.7 %   Platelets 418 (H) 150 - 400 K/uL   nRBC 0.0 0.0 - 0.2 %   Neutrophils Relative % 69 %   Neutro Abs 5.8 1.7 - 7.7 K/uL   Lymphocytes Relative 21 %   Lymphs Abs 1.8 0.7 - 4.0 K/uL   Monocytes Relative 9 %   Monocytes Absolute 0.7 0.1 - 1.0 K/uL   Eosinophils Relative 1 %    Eosinophils Absolute 0.1 0.0 - 0.5 K/uL   Basophils Relative 0 %   Basophils Absolute 0.0 0.0 - 0.1 K/uL   Immature Granulocytes 0 %   Abs Immature Granulocytes 0.03 0.00 - 0.07 K/uL    Comment: Performed at [facility], [address] [place], [place] [address]  MRSA Next Gen by PCR, Nasal     Status: None   Collection Time: 04/22/22  4:45 AM   Specimen: Nasal Mucosa; Nasal Swab  Result Value Ref Range   MRSA by PCR Next Gen NOT DETECTED NOT DETECTED    Comment: (NOTE) The GeneXpert MRSA Assay (FDA approved for NASAL specimens only), is one component of a comprehensive MRSA colonization surveillance program. It is not intended to diagnose MRSA infection nor to guide or monitor treatment for MRSA infections. Test performance is not FDA approved in patients less than 52 years old. Performed at [facility], [address] [place], [place] [address]   CBC     Status: Abnormal   Collection Time: 04/22/22  7:41 AM  Result Value Ref Range   WBC 5.3 4.0 - 10.5 K/uL   RBC 4.07 (L) 4.22 - 5.81 MIL/uL   Hemoglobin 11.2 (L) 13.0 - 17.0 g/dL   HCT 41.3 (L) 24.4 - 01.0 %   MCV 81.8 80.0 - 100.0 fL   MCH 27.5 26.0 - 34.0 pg   MCHC 33.6 30.0 - 36.0 g/dL   RDW 27.2 53.6 - 64.4 %   Platelets 413 (H) 150 - 400 K/uL   nRBC 0.0 0.0 - 0.2 %    Comment: Performed at [facility], [address], [place], [place] [address]  Basic metabolic panel     Status: Abnormal   Collection Time: 04/22/22  7:41 AM  Result Value Ref Range   Sodium 130 (L) 135 - 145 mmol/L   Potassium 4.4 3.5 - 5.1 mmol/L   Chloride 94 (L) 98 - 111 mmol/L   CO2 26 22 - 32 mmol/L   Glucose, Bld 123 (H) 70 - 99 mg/dL    Comment: Glucose reference range applies only to samples taken after fasting for at least 8 hours.   BUN 11 8 - 23 mg/dL   Creatinine, Ser 2.59 0.61 - 1.24 mg/dL   Calcium 9.2 8.9 - 56.3 mg/dL  GFR, Estimated >60 >60 mL/min    Comment: (NOTE) Calculated using the CKD-EPI  Creatinine Equation (2021)    Anion gap 10 5 - 15    Comment: Performed at [facility], [address] [place], [place] [address]  Magnesium     Status: None   Collection Time: 04/22/22  7:41 AM  Result Value Ref Range   Magnesium 2.3 1.7 - 2.4 mg/dL    Comment: Performed at [facility], [address] [place], [place] [address]    DG Chest Port 1 View  Result Date: 04/22/2022 CLINICAL DATA:  Airway compromise EXAM: PORTABLE CHEST 1 VIEW COMPARISON:  05/09/2020 FINDINGS: Normal heart size and mediastinal contours. There is no edema, consolidation, effusion, or pneumothorax. Mild atelectasis at the lung bases. Ossified body in the sub coracoid recess on the right. IMPRESSION: Mild atelectasis at the lung bases. Electronically Signed   By: Tiburcio Pea M.D.   On: 04/22/2022 05:07   CT Soft Tissue Neck W Contrast  Result Date: 04/21/2022 CLINICAL DATA:  Initial evaluation for head neck cancer. EXAM: CT NECK WITH CONTRAST TECHNIQUE: Multidetector CT imaging of the neck was performed using the standard protocol following the bolus administration of intravenous contrast. RADIATION DOSE REDUCTION: This exam was performed according to the departmental dose-optimization program which includes automated exposure control, adjustment of the mA and/or kV according to patient size and/or use of iterative reconstruction technique. CONTRAST:  75mL OMNIPAQUE IOHEXOL 300 MG/ML  SOLN COMPARISON:  None Available. FINDINGS: Pharynx and larynx: Ill-defined mass centered at the left palatine tonsil is seen. Lesion is difficult to measure given the infiltrative and poorly defined nature of this lesion, but measures approximately 4.0 x 5.1 x 5.4 cm in greatest trans axial dimensions. Lesion appears to cross the midline, abutting and involving the uvula. Anterior extension to involve the floor of mouth and base of tongue. Additional superior extension towards the left nasopharynx. Oropharyngeal airway is  narrowed and deviated to the right at the level of the lesion. No retropharyngeal collection. Epiglottis within normal limits. Remainder of the hypopharynx and supraglottic larynx within normal limits. Negative glottis. Subglottic airway clear. Salivary glands: Salivary glands including the parotid and submandibular glands are within normal limits. Thyroid: Normal. Lymph nodes: Extensive abnormal bulky adenopathy seen at left level 2, with largest nodal conglomerate measuring 3.7 x 3.8 cm (series 2, image 46). Scattered areas of internal hypodensity consistent with necrosis. Mildly prominent left level 1 B nodes measure up to 1 cm, also potentially involved (series 2, image 54). No right-sided adenopathy. Vascular: Normal intravascular enhancement seen throughout the neck. Mild atheromatous change about the carotid bifurcations. Strongly dominant right vertebral artery noted. Left IJ is compressed at the level of the left cervical adenopathy. Limited intracranial: Unremarkable. Visualized orbits: Unremarkable. Mastoids and visualized paranasal sinuses: Visualized paranasal sinuses are clear. Mastoid air cells and middle ear cavities are well pneumatized and free of fluid. Skeleton: No discrete or worrisome osseous lesions. Prior ACDF at C3 through C5. Advanced spondylosis present at C5-6 and C6-7 with prominent anterior endplate osteophytic spurring. Upper chest: Mild emphysematous changes.  No acute findings. Other: Heterogeneous fat density mass measuring up to 5 cm present at the right posterolateral neck (series 2, image 41). While this could reflect a benign lipoma, internal heterogeneity is greater than is typically seen. IMPRESSION: 1. Approximate 4.0 x 5.1 x 5.4 cm mass centered at the left palatine tonsil, consistent with primary head and neck malignancy. 2. Abnormal bulky  left level 2 adenopathy, consistent with nodal metastases. Additional prominent left level 1b nodes may be involved as well. 3. 5 cm fat  density mass at the right posterolateral neck, indeterminate. While this could reflect a benign lipoma, internal heterogeneity is greater than is typically seen. Correlation with physical exam recommended. Further evaluation dedicated nonemergent MRI could be performed for further evaluation as warranted. 4. Prior ACDF at C3 through C5 with advanced spondylosis at C5-6 and C6-7. Emphysema (ICD10-J43.9). Electronically Signed   By: Rise Mu M.D.   On: 04/21/2022 21:22    ROS Blood pressure (!) 145/84, pulse 64, temperature 98 F (36.7 C), temperature source Oral, resp. rate 12, height 6' (1.829 m), weight 74.8 kg, SpO2 99 %. Physical Exam Constitutional:      Appearance: Normal appearance.  HENT:     Nose: Nose normal.     Mouth/Throat:     Comments: Ulcerated area of the left tonsil area with exophytic tissue. The tumor appears to extend into the left base of tongue and possibly mobile tongue there is pain in the area and some limitation of tongue movement. The uvula is normal and the posterior wall looks normal.  Eyes:     Extraocular Movements: Extraocular movements intact.     Pupils: Pupils are equal, round, and reactive to light.  Neck:     Comments: Bulky nodes in the neck on left and large soft lipoma on the right posterior neck.  Neurological:     Mental Status: He is alert.       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 It was suggested he might need trach now so he was admitted. he has large tumor of the left tonsil  that is not extending  below the left base of tongue. The glottis is normal and he has no airway issues. It is certainly not known if he will develop any airway issues with the treatment of this tumor but he understands it is possible. We discussed a trach and he is certain he does not want that now. He can follow up as outpatient to follow and if starts having airway problems get trach later. He does not appear to have any need to  be in hospital. Call [phone] for follow up ENT appointment.   Suzanna Obey 04/22/2022, 10:01 AM

## 2022-04-22 NOTE — ED Notes (Signed)
ED TO INPATIENT HANDOFF REPORT  Name/Age/Gender [name] 62 y.o. male  Code Status    Code Status Orders  (From admission, onward)           Start     Ordered   04/21/22 2356  Full code  Continuous       Question:  By:  Answer:  Consent: discussion documented in EHR   04/21/22 2357           Code Status History     Date Active Date Inactive Code Status Order ID Comments User Context   04/20/2018 2248 04/22/2018 1718 Full Code [id number]  Oneta Rack, NP Inpatient   04/18/2018 2249 04/20/2018 2139 Full Code [id number]  Jeanie Sewer, PA-C ED   04/08/2018 1356 04/11/2018 1738 Full Code [id number]  Charm Rings, NP Inpatient   04/07/2018 2012 04/08/2018 1311 Full Code [id number]  Shaune Pollack, MD ED   08/01/2017 1725 08/04/2017 1638 Full Code [id number]  Charm Rings, NP Inpatient   07/31/2017 0258 08/01/2017 1630 Full Code [id number]  Dietrich Pates, PA-C ED   07/08/2017 1343 07/10/2017 1507 Full Code [id number]  Julio Sicks, MD ED   07/06/2017 1704 07/07/2017 1431 Full Code [id number]  Alyson Ingles, PA-C Inpatient   06/27/2017 1719 06/29/2017 1754 Full Code [id number]  Noralee Stain, DO Inpatient   09/25/2016 0242 09/25/2016 1709 Full Code [id number]  Rise Mu, PA-C ED   09/25/2016 0105 09/25/2016 0242 Full Code [id number]  Wynetta Emery, PA-C ED   09/18/2016 2306 09/21/2016 1952 Full Code [id number]  Delila Pereyra, NP Inpatient   09/18/2016 0224 09/18/2016 2303 Full Code [id number]  Roxy Horseman, PA-C ED   04/23/2014 1119 04/26/2014 1514 Full Code [id number]  Abigail Miyamoto, MD Inpatient       Home/SNF/Other Home  Chief Complaint Airway compromise [J98.8]  Level of Care/Admitting Diagnosis ED Disposition     ED Disposition  Admit   Condition  --   Comment  Hospital Area: [facility] [[id number]]  Level of Care: ICU [6]  May admit patient to [facility] or [facility] if equivalent level of care is available:: No  Covid Evaluation:  Asymptomatic - no recent exposure (last 10 days) testing not required  Diagnosis: Airway compromise [915041]  Admitting Physician: Josephine Igo [[id number]]  Attending Physician: Josephine Igo [[id number]]  Certification:: I certify this patient will need inpatient services for at least 2 midnights  Estimated Length of Stay: 5          Medical History 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   Hemorrhoids    HIV (human immunodeficiency virus infection)    Stenosis of cervical spine    withb myelopathy    Allergies Allergies  Allergen Reactions   Shellfish Allergy Anaphylaxis and Swelling    Swelling everywhere   Sulfa Antibiotics Anaphylaxis, Hives, Itching and Swelling    Swelling all over    Fish Allergy Swelling   Clindamycin Diarrhea    IV Location/Drains/Wounds Patient Lines/Drains/Airways Status     Active Line/Drains/Airways     Name Placement date Placement time Site Days   Peripheral IV 04/21/22 20 G 1" Anterior;Right Forearm 04/21/22  1827  Forearm  1            Labs/Imaging Results for orders placed or performed during the hospital encounter of 04/21/22 (from the past 48 hour(s))  Comprehensive metabolic panel     Status: Abnormal   Collection  Time: 04/21/22  7:57 PM  Result Value Ref Range   Sodium 127 (L) 135 - 145 mmol/L   Potassium 4.0 3.5 - 5.1 mmol/L   Chloride 94 (L) 98 - 111 mmol/L   CO2 24 22 - 32 mmol/L   Glucose, Bld 108 (H) 70 - 99 mg/dL    Comment: Glucose reference range applies only to samples taken after fasting for at least 8 hours.   BUN 16 8 - 23 mg/dL   Creatinine, Ser 1.61 0.61 - 1.24 mg/dL   Calcium 8.8 (L) 8.9 - 10.3 mg/dL   Total Protein 7.4 6.5 - 8.1 g/dL   Albumin 3.9 3.5 - 5.0 g/dL   AST 31 15 - 41 U/L   ALT 21 0 - 44 U/L   Alkaline Phosphatase 44 38 - 126 U/L   Total Bilirubin 0.6 0.3 - 1.2 mg/dL   GFR, Estimated >09 >60 mL/min    Comment: (NOTE) Calculated using the CKD-EPI Creatinine  Equation (2021)    Anion gap 9 5 - 15    Comment: Performed at [facility], [address] [place], [place] [address]  CBC with Differential/Platelet     Status: Abnormal   Collection Time: 04/21/22  7:57 PM  Result Value Ref Range   WBC 8.5 4.0 - 10.5 K/uL   RBC 3.85 (L) 4.22 - 5.81 MIL/uL   Hemoglobin 10.2 (L) 13.0 - 17.0 g/dL   HCT 81.1 (L) 91.4 - 78.2 %   MCV 82.1 80.0 - 100.0 fL   MCH 26.5 26.0 - 34.0 pg   MCHC 32.3 30.0 - 36.0 g/dL   RDW 95.6 21.3 - 08.6 %   Platelets 418 (H) 150 - 400 K/uL   nRBC 0.0 0.0 - 0.2 %   Neutrophils Relative % 69 %   Neutro Abs 5.8 1.7 - 7.7 K/uL   Lymphocytes Relative 21 %   Lymphs Abs 1.8 0.7 - 4.0 K/uL   Monocytes Relative 9 %   Monocytes Absolute 0.7 0.1 - 1.0 K/uL   Eosinophils Relative 1 %   Eosinophils Absolute 0.1 0.0 - 0.5 K/uL   Basophils Relative 0 %   Basophils Absolute 0.0 0.0 - 0.1 K/uL   Immature Granulocytes 0 %   Abs Immature Granulocytes 0.03 0.00 - 0.07 K/uL    Comment: Performed at [facility], [address], [place], [place] [address]   CT Soft Tissue Neck W Contrast  Result Date: 04/21/2022 CLINICAL DATA:  Initial evaluation for head neck cancer. EXAM: CT NECK WITH CONTRAST TECHNIQUE: Multidetector CT imaging of the neck was performed using the standard protocol following the bolus administration of intravenous contrast. RADIATION DOSE REDUCTION: This exam was performed according to the departmental dose-optimization program which includes automated exposure control, adjustment of the mA and/or kV according to patient size and/or use of iterative reconstruction technique. CONTRAST:  75mL OMNIPAQUE IOHEXOL 300 MG/ML  SOLN COMPARISON:  None Available. FINDINGS: Pharynx and larynx: Ill-defined mass centered at the left palatine tonsil is seen. Lesion is difficult to measure given the infiltrative and poorly defined nature of this lesion, but measures approximately 4.0 x 5.1 x 5.4 cm in  greatest trans axial dimensions. Lesion appears to cross the midline, abutting and involving the uvula. Anterior extension to involve the floor of mouth and base of tongue. Additional superior extension towards the left nasopharynx. Oropharyngeal airway is narrowed and deviated to the right at the level of the lesion. No retropharyngeal collection. Epiglottis within normal  limits. Remainder of the hypopharynx and supraglottic larynx within normal limits. Negative glottis. Subglottic airway clear. Salivary glands: Salivary glands including the parotid and submandibular glands are within normal limits. Thyroid: Normal. Lymph nodes: Extensive abnormal bulky adenopathy seen at left level 2, with largest nodal conglomerate measuring 3.7 x 3.8 cm (series 2, image 46). Scattered areas of internal hypodensity consistent with necrosis. Mildly prominent left level 1 B nodes measure up to 1 cm, also potentially involved (series 2, image 54). No right-sided adenopathy. Vascular: Normal intravascular enhancement seen throughout the neck. Mild atheromatous change about the carotid bifurcations. Strongly dominant right vertebral artery noted. Left IJ is compressed at the level of the left cervical adenopathy. Limited intracranial: Unremarkable. Visualized orbits: Unremarkable. Mastoids and visualized paranasal sinuses: Visualized paranasal sinuses are clear. Mastoid air cells and middle ear cavities are well pneumatized and free of fluid. Skeleton: No discrete or worrisome osseous lesions. Prior ACDF at C3 through C5. Advanced spondylosis present at C5-6 and C6-7 with prominent anterior endplate osteophytic spurring. Upper chest: Mild emphysematous changes.  No acute findings. Other: Heterogeneous fat density mass measuring up to 5 cm present at the right posterolateral neck (series 2, image 41). While this could reflect a benign lipoma, internal heterogeneity is greater than is typically seen. IMPRESSION: 1. Approximate 4.0 x 5.1  x 5.4 cm mass centered at the left palatine tonsil, consistent with primary head and neck malignancy. 2. Abnormal bulky left level 2 adenopathy, consistent with nodal metastases. Additional prominent left level 1b nodes may be involved as well. 3. 5 cm fat density mass at the right posterolateral neck, indeterminate. While this could reflect a benign lipoma, internal heterogeneity is greater than is typically seen. Correlation with physical exam recommended. Further evaluation dedicated nonemergent MRI could be performed for further evaluation as warranted. 4. Prior ACDF at C3 through C5 with advanced spondylosis at C5-6 and C6-7. Emphysema (ICD10-J43.9). Electronically Signed   By: Rise Mu M.D.   On: 04/21/2022 21:22    Pending Labs Unresulted Labs (From admission, onward)     Start     Ordered   04/22/22 0500  CBC  Tomorrow morning,   R        04/21/22 2357   04/22/22 0500  Basic metabolic panel  Tomorrow morning,   R        04/21/22 2357   04/22/22 0500  Magnesium  Tomorrow morning,   R        04/21/22 2357   04/22/22 0500  Phosphorus  Tomorrow morning,   R        04/21/22 2357   04/22/22 0500  Protime-INR  Tomorrow morning,   R        04/21/22 2357   04/21/22 1808  CBC with Differential  Once,   STAT        04/21/22 1807            Vitals/Pain Today's Vitals   04/21/22 2233 04/22/22 0100 04/22/22 0139 04/22/22 0200  BP:  129/78  (!) 140/92  Pulse:  75  83  Resp:  14  15  Temp:  98.2 F (36.8 C)    TempSrc:  Oral    SpO2:  97%  97%  Weight:      Height:      PainSc: 9   10-Worst pain ever     Isolation Precautions No active isolations  Medications Medications  docusate sodium (COLACE) capsule 100 mg (has no administration in time range)  polyethylene  glycol (MIRALAX / GLYCOLAX) packet 17 g (has no administration in time range)  heparin injection 5,000 Units (has no administration in time range)  sodium chloride 0.9 % bolus 500 mL (0 mLs Intravenous  Stopped 04/21/22 1854)  morphine (PF) 4 MG/ML injection 4 mg (4 mg Intravenous Given 04/21/22 1828)  HYDROmorphone (DILAUDID) injection 1 mg (1 mg Intravenous Given 04/21/22 2016)  iohexol (OMNIPAQUE) 300 MG/ML solution 75 mL (75 mLs Intravenous Contrast Given 04/21/22 2058)  dexamethasone (DECADRON) injection 10 mg (10 mg Intravenous Given 04/21/22 2233)  HYDROmorphone (DILAUDID) injection 1 mg (1 mg Intravenous Given 04/21/22 2241)  morphine (PF) 4 MG/ML injection 4 mg (4 mg Intravenous Given 04/22/22 0139)    Mobility walks

## 2022-04-22 NOTE — Progress Notes (Signed)
eLink Physician-Brief Progress Note Patient Name: [name] DOB: 01/19/1960 MRN: [id number]   Date of Service  04/22/2022  HPI/Events of Note  Patient admitted with tonsillar carcinoma, neck / oropharyngeal swelling, and concern for potential airway compromise, radiation oncology and ENT have been consulted.  eICU Interventions  New Patient Evaluation.        Thomasene Lot [name] 04/22/2022, 5:01 AM

## 2022-04-23 DIAGNOSIS — J988 Other specified respiratory disorders: Secondary | ICD-10-CM | POA: Diagnosis not present

## 2022-04-23 LAB — HIV-1 RNA QUANT-NO REFLEX-BLD
HIV 1 RNA Quant: 30 copies/mL
LOG10 HIV-1 RNA: 1.477 log10copy/mL

## 2022-04-23 MED ORDER — POLYETHYLENE GLYCOL 3350 17 G PO PACK
17.0000 g | PACK | Freq: Every day | ORAL | Status: DC
  Filled 2022-04-23 (×3): qty 1

## 2022-04-23 MED ORDER — HYDROCODONE-ACETAMINOPHEN 5-325 MG PO TABS
1.0000 | ORAL_TABLET | ORAL | Status: DC | PRN
  Administered 2022-04-23 – 2022-04-25 (×8): 1 via ORAL
  Filled 2022-04-23 (×8): qty 1

## 2022-04-23 MED ORDER — HYDROMORPHONE HCL 1 MG/ML IJ SOLN
0.2000 mg | INTRAMUSCULAR | Status: DC | PRN
  Administered 2022-04-23 – 2022-04-24 (×5): 0.2 mg via INTRAVENOUS
  Filled 2022-04-23 (×5): qty 0.5

## 2022-04-23 MED ORDER — OXYCODONE HCL 5 MG PO TABS
5.0000 mg | ORAL_TABLET | Freq: Once | ORAL | Status: AC
  Administered 2022-04-23: 5 mg via ORAL
  Filled 2022-04-23: qty 1

## 2022-04-23 NOTE — Plan of Care (Signed)
Patient resting in bed with call light in reach.

## 2022-04-23 NOTE — Progress Notes (Signed)
NAME:  [name], MRN:  [id number], DOB:  [birth year], LOS: 2 ADMISSION DATE:  04/21/2022 CONSULTATION DATE:  04/22/2022 REFERRING MD:  Wilkie Aye - EDP CHIEF COMPLAINT:  Airway compromise   History of Present Illness:  62 year old man who presented to [facility] ED for SOB, L neck pain/swelling. 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 Basilio Cairo). Patient recently relocated to [facility] from [place]; initial plan was to see surgical oncology in [place] [facility] for consideration of surgical options.  Patient reports significantly increased L neck/jawline swelling that is TTP. It is firm and nonmobile. R posterior nec with soft, mobile mass more c/w lipoma. Patient states that he is able to eat regular food and drink without issues; denies swallow difficulty (though grimaces slightly when asked to swallow). Reports pain on the side of his tongue that feels like sensitivity, most notable with chewing. He states that he continues to eat regular food and would not ever want a feeding tube. He also brings up tracheostomy as something he would never want. Despite this, patient was transferred from [facility] to [facility] for further care, ENT evaluation and possible tracheostomy. He is hopeful that he will be able to get radiation/chemo to shrink the tumor.  PCCM consulted for ICU admission in the setting of threatened airway.  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 Recent diagnosis of H&N CA, due to establish with Dr. Basilio Cairo on date of admission. 4/13 - PCCM consulted for  admission, transfer from [facility] to [facility] for possible trach with ENT  Interim History / Subjective:  Has declined tracheostomy after discussion with ENT and ENT felt reasonable to pursue on outpatient basis if proves necessary due to emerging upper airway obstruction or if he reconsiders pursuing it up front before XRT/chemo.  He does report greater constant pain at tongue base/neck, odynophagia not at all well managed on POs but no increased dyspnea, trouble with secretions.  Objective:  Blood pressure 117/71, pulse 75, temperature 98.3 F (36.8 C), temperature source Oral, resp. rate 15, height 6' (1.829 m), weight 72.3 kg, SpO2 100 %.        Intake/Output Summary (Last 24 hours) at 04/23/2022 0916 Last data filed at 04/23/2022 0000 Gross per 24 hour  Intake 480 ml  Output 100 ml  Net 380 ml   Filed Weights   04/21/22 1729 04/22/22 0445 04/23/22 0500  Weight: 74.8 kg 74.8 kg 72.3 kg   Physical Examination: General appearance: 62 y.o., male, NAD, conversant  HENT: NCAT; MMM, fullness over left posterior oropharynx, no stridor Neck: R, L neck masses Lungs: CTAB, no crackles, no wheeze, with normal respiratory effort CV: RRR, no murmur  Abdomen: Soft, non-tender; non-distended, BS present  Extremities: No peripheral edema, warm Skin: Normal turgor and texture; no rash Neuro: grossly nonfocal   Resolved Hospital Problem List:    Assessment & Plan:   Left tonsillar carcinoma Concern for airway compromise Presented to [facility] for SOB. Recent diagnosis of L tonsillar mass, s/p biopsy while still living in [place]. Initially scheduled with Rad-Onc in [place] but relocated  to [facility] for better social support; in the process of establishing care with Dr. Basilio Cairo. - remains inpatient for IV prn medications for pain control - Transfer to [facility] in case decision made to pursue inpatient XRT. Would page radiation oncology on Monday 4/15. - I have reached out to medical oncology and Dr. Al Pimple  is being notified by on call oncology in case needs inpatient chemo - ENT consulted, trach is deferred   HIV, well-controlled - Continue home Symtuza - F/u CD4 count, viral load  Cervical spine stenosis - Supportive care - Pain medications PRN - PT/OT as indicated  Best Practice: (right click and "Reselect all SmartList Selections" daily)   Diet/type: Regular consistency (see orders) DVT prophylaxis: prophylactic heparin  GI prophylaxis: N/A Lines: N/A Foley:  N/A Code Status:  full code Last date of multidisciplinary goals of care discussion [pt updated at bedside 4/14]  Labs:  CBC: Recent Labs  Lab 04/21/22 1957 04/22/22 0741  WBC 8.5 5.3  NEUTROABS 5.8  --   HGB 10.2* 11.2*  HCT 31.6* 33.3*  MCV 82.1 81.8  PLT 418* 413*   Basic Metabolic Panel: Recent Labs  Lab 04/21/22 1957 04/22/22 0741  NA 127* 130*  K 4.0 4.4  CL 94* 94*  CO2 24 26  GLUCOSE 108* 123*  BUN 16 11  CREATININE 1.10 0.83  CALCIUM 8.8* 9.2  MG  --  2.3   GFR: Estimated Creatinine Clearance: 95.6 mL/min (by C-G formula based on SCr of 0.83 mg/dL). Recent Labs  Lab 04/21/22 1957 04/22/22 0741  WBC 8.5 5.3   Liver Function Tests: Recent Labs  Lab 04/21/22 1957  AST 31  ALT 21  ALKPHOS 44  BILITOT 0.6  PROT 7.4  ALBUMIN 3.9   No results for input(s): "LIPASE", "AMYLASE" in the last 168 hours. No results for input(s): "AMMONIA" in the last 168 hours.  ABG:    Component Value Date/Time   HCO3 25.6 (H) 01/30/2007 0220   TCO2 26 06/27/2017 1336    Coagulation Profile: No results for input(s): "INR", "PROTIME" in the last 168 hours.  Cardiac Enzymes: No results for input(s): "CKTOTAL", "CKMB", "CKMBINDEX", "TROPONINI" in the last 168 hours.  HbA1C: Hgb A1c MFr Bld  Date/Time Value Ref Range Status  04/21/2018 06:27 AM 5.3 4.8 - 5.6 % Final    Comment:    (NOTE)         Prediabetes: 5.7 - 6.4         Diabetes: >6.4         Glycemic control for adults with diabetes:  <7.0   06/27/2017 05:35 PM 6.0 (H) 4.8 - 5.6 % Final    Comment:    (NOTE) Pre diabetes:          5.7%-6.4% Diabetes:              >6.4% Glycemic control for   <7.0% adults with diabetes    CBG: No results for input(s): "GLUCAP" in the last 168 hours.  Review of Systems:   Review of systems completed with pertinent positives/negatives outlined in above HPI.  Past Medical History:  He,  has a past medical history of Anal condyloma (05/22/2011), Hemorrhoids, HIV (human immunodeficiency virus infection), and Stenosis of cervical spine.   Surgical History:   Past Surgical History:  Procedure Laterality Date   ANTERIOR CERVICAL DECOMP/DISCECTOMY FUSION N/A 07/06/2017   Procedure: ANTERIOR CERVICAL DECOMPRESSION/DISCECTOMY FUSION C3-4/C4-5 2 LEVELS;  Surgeon: Lisbeth Renshaw, MD;  Location: [facility] OR;  Service: Neurosurgery;  Laterality: N/A;   HERNIA REPAIR  97   lt ing   RECTAL EXAM UNDER ANESTHESIA N/A 04/24/2014   Procedure:  ligation of bleeding vessels;  Surgeon: Almond Lint, MD;  Location: [facility] ORS;  Service: General;  Laterality: N/A;   TENDON REPAIR     2006-lt index   WART FULGURATION  07/06/2011   Procedure: FULGURATION ANAL WART;  Surgeon: Shelly Rubenstein, MD;  Location: [facility];  Service: General;  Laterality: N/A;  excision anal condyloma   WART FULGURATION N/A 04/23/2014   Procedure: EXCISION OF ANAL CONDYLOMA;  Surgeon: Abigail Miyamoto, MD;  Location: [facility]S;  Service: General;  Laterality: N/A;   Social History:   reports that he has been smoking cigarettes. He has a 12.50 pack-year smoking history. He has never used smokeless tobacco. He reports current alcohol use of about 24.0 standard drinks of alcohol per week. He reports current drug use. Drug: "Crack" cocaine.   Family History:  His family history includes Cancer in his mother and sister.   Allergies: Allergies  Allergen Reactions   Shellfish Allergy Anaphylaxis and Swelling    Swelling  everywhere   Sulfa Antibiotics Anaphylaxis, Hives, Itching and Swelling    Swelling all over    Fish Allergy Swelling   Clindamycin Diarrhea   Home Medications: Prior to Admission medications   Medication Sig Start Date End Date Taking? Authorizing Provider  aspirin EC 81 MG tablet Take 81 mg by mouth daily.    [provider]  baclofen (LIORESAL) 10 MG tablet Take 20 mg by mouth 3 (three) times daily.  Patient not taking: Reported on 09/22/2019 02/16/18   [provider]  baclofen (LIORESAL) 20 MG tablet Take 20 mg by mouth 3 (three) times daily. Patient not taking: Reported on 03/01/2021 06/16/19   [provider]  buPROPion (WELLBUTRIN XL) 300 MG 24 hr tablet  06/13/19   [provider]  Darunavir-Cobicistat-Emtricitabine-Tenofovir Alafenamide (SYMTUZA) 800-150-200-10 MG TABS Take 1 tablet by mouth daily with breakfast. 03/01/21   [name], MD  fluconazole (DIFLUCAN) 150 MG tablet Take 1 tablet (150 mg total) by mouth once a week. Patient not taking: Reported on 08/28/2019 06/26/19   Park Liter, DPM  gabapentin (NEURONTIN) 300 MG capsule Take 1 capsule (300 mg total) by mouth 3 (three) times daily. Patient not taking: Reported on 03/01/2021 04/11/18   Malvin Johns, MD  gabapentin (NEURONTIN) 800 MG tablet Take 800 mg by mouth 3 (three) times daily. 02/12/21   [provider]  HYDROcodone-acetaminophen (NORCO/VICODIN) 5-325 MG tablet Take 1 tablet by mouth every 6 (six) hours as needed. 05/23/21   Charlynne Pander, MD  hydrOXYzine (ATARAX/VISTARIL) 25 MG tablet Take 1 tablet (25 mg total) by mouth every 6 (six) hours as needed (anxiety/agitation or CIWA < or = 10). 08/03/17   Starkes-Perry, Juel Burrow, FNP  losartan (COZAAR) 25 MG tablet Take 25 mg by mouth daily. 11/25/20   [provider]  pantoprazole (PROTONIX) 40 MG tablet Take 40 mg by mouth daily. Patient not taking: Reported on 03/01/2021    [provider]  terbinafine  (LAMISIL) 250 MG tablet Take 1 tablet (250 mg total) by mouth daily. Patient not taking: Reported on 03/01/2021 02/24/20   Park Liter, DPM  tiZANidine (ZANAFLEX) 4 MG tablet Take 4 mg by mouth 2 (two) times daily. 11/23/20   [provider]  topiramate (TOPAMAX) 100 MG tablet Take 1 tablet (100 mg total) by mouth 3 (three) times  daily. Patient not taking: Reported on 09/22/2019 04/11/18   Malvin Johns, MD    Critical care time: N/A   Omar Person, MD [facility] 04/23/22 9:16 AM  Please see [email or web address] for pager details.  From 7A-7P if no response, please call [phone] After hours, please call ELink [phone]

## 2022-04-23 NOTE — Progress Notes (Signed)
eLink Physician-Brief Progress Note Patient Name: [name] DOB: August 22, 1960 MRN: [id number]   Date of Service  04/23/2022  HPI/Events of Note  Pt having Marland Kitchen anxiety asking for klonopin (not on PTA meds list) they have atarax 25 mg Q6 PRN anxiety on PTA lis  Home meds: Not on klonopin.  Here for Trach for tonsillar cancer, but he refused tarch- per ENT.  VS stable, on room air.  eICU Interventions  Would avoid benzos or narcotics for now. If getting worse, might consider precedex drip. Watch for airway compromise.  Discussed with RN     Intervention Category Major Interventions: Other:  Ranee Gosselin 04/23/2022, 1:53 AM

## 2022-04-24 ENCOUNTER — Telehealth: Payer: Self-pay | Admitting: Radiation Oncology

## 2022-04-24 ENCOUNTER — Ambulatory Visit
Admit: 2022-04-24 | Discharge: 2022-04-24 | Disposition: A | Discharge status: Home / Self Care | Payer: Medicare Other | Attending: Radiation Oncology | Admitting: Radiation Oncology

## 2022-04-24 DIAGNOSIS — B20 Human immunodeficiency virus [HIV] disease: Secondary | ICD-10-CM

## 2022-04-24 DIAGNOSIS — C4492 Squamous cell carcinoma of skin, unspecified: Secondary | ICD-10-CM | POA: Diagnosis not present

## 2022-04-24 DIAGNOSIS — C090 Malignant neoplasm of tonsillar fossa: Secondary | ICD-10-CM

## 2022-04-24 DIAGNOSIS — D649 Anemia, unspecified: Secondary | ICD-10-CM | POA: Insufficient documentation

## 2022-04-24 LAB — T-HELPER CELLS (CD4) COUNT (NOT AT ARMC)
CD4 % Helper T Cell: 20 % — ABNORMAL LOW (ref 33–65)
CD4 T Cell Abs: 225 /uL — ABNORMAL LOW (ref 400–1790)

## 2022-04-24 MED ORDER — HYDROMORPHONE HCL 1 MG/ML IJ SOLN
1.0000 mg | INTRAMUSCULAR | Status: DC | PRN
  Administered 2022-04-24 (×2): 1 mg via INTRAVENOUS
  Filled 2022-04-24 (×2): qty 1

## 2022-04-24 MED ORDER — HYDROMORPHONE HCL 1 MG/ML IJ SOLN
0.5000 mg | INTRAMUSCULAR | Status: DC | PRN
  Administered 2022-04-24 – 2022-04-25 (×4): 1 mg via INTRAVENOUS
  Filled 2022-04-24 (×4): qty 1

## 2022-04-24 MED ORDER — MAGIC MOUTHWASH W/LIDOCAINE
15.0000 mL | Freq: Four times a day (QID) | ORAL | Status: DC
  Administered 2022-04-24 (×3): 15 mL via ORAL
  Filled 2022-04-24 (×7): qty 15

## 2022-04-24 NOTE — Hospital Course (Signed)
Mr. 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  Also has adenopathy consistent with nodal metastasis.  Also PMH of HIV and ongoing tobacco use despite diagnosis.  He has been recommended for PET scan for further evaluation to help guide radiation treatments. He had ultimately initially presented due to shortness of breath and pain in his left neck especially with swallowing.  He was then transferred to [facility] for ENT evaluation for possible tracheostomy but he declined procedure and was therefore transferred back to [facility] for possible chemo and radiation. He was able to be evaluated by radiation oncology and medical oncology.  Arrangements were made for outpatient follow-ups especially for PET scan, after which time he will be able to have simulation for radiation and beginning treatments. Pain control was pursued during hospitalization.  He was able to tolerate a diet fairly well.  He was discharged with Magic mouthwash, pain control, and referral to palliative care for ongoing symptom management at discharge.

## 2022-04-24 NOTE — Care Management Important Message (Signed)
Important Message  Patient Details IM Letter placed in Patient's room. Name: [name] MRN: [id number] Date of Birth: [birth year]   Medicare Important Message Given:  Yes     Caren Macadam 04/24/2022, 10:34 AM

## 2022-04-24 NOTE — TOC Progression Note (Signed)
Transition of Care [facility]) - Progression Note    Patient Details  Name: [name] MRN: [id number] Date of Birth: [birth year]  Transition of Care [facility]) CM/SW Contact  Beckie Busing, RN Phone [phone]  04/24/2022, 3:50 PM  Clinical Narrative:     Transition of Care (TOC) Screening Note   Patient Details  Name: [name] Date of Birth: 06-22-1960   Transition of [facility]) CM/SW Contact:    Beckie Busing, RN Phone Number: 04/24/2022, 3:50 PM    Transition of Care Department [facility]) has reviewed patient and no TOC needs have been identified at this time. We will continue to monitor patient advancement through interdisciplinary progression rounds. If new patient transition needs arise, please place a TOC consult.          Expected Discharge Plan and Services                                               Social Determinants of Health (SDOH) Interventions SDOH Screenings   Alcohol Screen: Medium Risk (04/21/2018)  Depression (PHQ2-9): Low Risk  (03/01/2021)  Tobacco Use: High Risk (04/21/2022)    Readmission Risk Interventions     No data to display

## 2022-04-24 NOTE — Telephone Encounter (Signed)
4/15 @ 10:41 am received call back from [facility], [place].  She stated cd was sent out on 4/5 and usually take 10 business days for the cd to arrived and sure be here by this Friday.

## 2022-04-24 NOTE — Telephone Encounter (Signed)
4/15 @ 8:16 am requested cd on 4/5, images will be mailed out by post office on a cd, since we cannot connected to powershare -per [facility], med rec dept.  F/U call to [facility].  Left voicemail we still have not received cd for images for patient.  Waiting on call back.

## 2022-04-24 NOTE — Progress Notes (Signed)
Patient stated that he was concerned about his pain regimen and feels that his pain has not been properly managed during this shift. I explained to patient that a pain medication was given to him and can have the other PRN pain medication an hour after administration of the tablet. Informed hospitalist of patient's wishes to have IV pain medication.

## 2022-04-24 NOTE — Consult Note (Signed)
Hematology/Oncology Consult Note  Clinical Summary: Mr. 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  Reason for Consult: Concern for airway compromise in the setting of squamous cell carcinoma of the head/neck.  HPI: Mr. Matteus Street is a 62 year old male with medical history significant for HIV who is currently admitted for concern of airway compromise from a recently diagnosed left palantine tonsil squamous cell carcinoma.  ENT services and radiation oncology services have already been consulted.  ENT surgery declined placement of a tracheostomy at this time.  They also reported that patient declined to have the procedure and they recommended outpatient follow-up so that a trach could be placed if situation worsens.  Radiation oncology notes that urgent radiation therapy would not be recommended in the setting of airway compromise and recommend tracheostomy placement if airway is unstable.  The patient is currently set up for outpatient PET CT scan and follow-up with medical oncology.  On exam today Mr. Kackley reports he feels much better than he did when he was initially admitted to the hospital.  He was originally frightened because he was developing worsening shortness of breath and the mass appeared to be growing.  He was having stridor and the symptoms fortunately have improved with Decadron therapy alone.  He notes that he has been able to eat and drink without much difficulty though he is having some discomfort and pain.  He reports that he has been using his swish and swallow lidocaine which has been assisting with this.  He he notes that he does not wish to have a PEG tube placed but he is agreeable to having port placement if required.  Overall he appears stable for discharge from a medical oncology  perspective.  O:  Vitals:   04/24/22 0417 04/24/22 1517  BP: 136/75 124/72  Pulse: 80 69  Resp: 14 16  Temp: (!) 97.5 F (36.4 C) 97.8 F (36.6 C)  SpO2: 100% 93%      Latest Ref Rng & Units 04/22/2022    7:41 AM 04/21/2022    7:57 PM 03/01/2021    4:49 PM  CMP  Glucose 70 - 99 mg/dL 131  438  88   BUN 8 - 23 mg/dL 11  16  15    Creatinine 0.61 - 1.24 mg/dL 8.87  5.79  7.28   Sodium 135 - 145 mmol/L 130  127  139   Potassium 3.5 - 5.1 mmol/L 4.4  4.0  4.7   Chloride 98 - 111 mmol/L 94  94  104   CO2 22 - 32 mmol/L 26  24  27    Calcium 8.9 - 10.3 mg/dL 9.2  8.8  9.9   Total Protein 6.5 - 8.1 g/dL  7.4  7.8   Total Bilirubin 0.3 - 1.2 mg/dL  0.6  0.5   Alkaline Phos 38 - 126 U/L  44    AST 15 - 41 U/L  31  26   ALT 0 - 44 U/L  21  17       Latest Ref Rng & Units 04/22/2022    7:41 AM 04/21/2022    7:57 PM 03/01/2021    4:49 PM  CBC  WBC 4.0 - 10.5 K/uL 5.3  8.5  5.7   Hemoglobin 13.0 - 17.0 g/dL 20.6  01.5  61.5   Hematocrit 39.0 - 52.0 %  33.3  31.6  40.2   Platelets 150 - 400 K/uL 413  418  302       GENERAL: well appearing middle-aged African-American male in NAD  SKIN: skin color, texture, turgor are normal, no rashes or significant lesions EYES: conjunctiva are pink and non-injected, sclera clear OROPHARYNX: Markedly enlarged left-sided tonsil with no evidence of airway obstruction NECK: supple, non-tender LYMPH: Markedly firm palpable left-sided cervical lymph node.  Large soft lipoma on right side of neck.  LUNGS: clear to auscultation and percussion with normal breathing effort HEART: regular rate & rhythm and no murmurs and no lower extremity edema Musculoskeletal: no cyanosis of digits and no clubbing  PSYCH: alert & oriented x 3, fluent speech NEURO: no focal motor/sensory deficits  Assessment/Plan:  # Left Palantine Tonsil Squamous Cell Carcinoma, P16 positive. Staging underway # SCC H/N Spread to Cervical Lymph Nodes -- The patient has biopsy-proven  squamous cell carcinoma of the head neck, p16 positive. -- ENT notes that tracheostomy is not required at this time and patient would prefer to defer the procedure as well.  ENT following in the event that tracheostomy were to become necessary -- Radiation oncology consulted and following with the patient as well. -- Patient is adequately able to swallow solids and liquids.  He does have some throat pain.  Currently being managed with Magic mouthwash with lidocaine -- Outpatient PET CT scan scheduled for 04/27/2022 and clinic visit with Dr. Al Pimple who also scheduled for outpatient setting. -- At this time no barrier to discharge from an oncological standpoint.  Once patient is ready for discharge follow-up visits have already been scheduled.  Ulysees Barns, MD Department of Hematology/Oncology [facility] Phone: [phone] Pager: [phone] Email: [email or web address]

## 2022-04-24 NOTE — Assessment & Plan Note (Signed)
-   Patient declined tracheostomy placement; evaluated by ENT.  He is recommended for outpatient follow-up as necessary for any further discussions regarding trach -Currently no signs of airway compromise

## 2022-04-24 NOTE — Assessment & Plan Note (Signed)
-   left palatine tonsil -Evaluated by ENT as well.  Large tumor of left tonsil extending into nasopharynx and base of tongue -Patient continues to smoke; cessation has been strongly reinforced  - Some odynophagia but able to adequately swallow solids and liquids - patient has PET scan 4/18 - given norco, MMW, and referral for palliative care at d/c

## 2022-04-24 NOTE — Progress Notes (Signed)
Pt found to be sleeping soundly upon medication pass at 9pm.  Woke patient up to take scheduled medications and pt requested pain medication for 7/10 pain.  Brought pt vicodin, and he stated that he wanted to take IV pain medication instead.  Explained to patient that per policy and practice that we would need to start with the lower form of patient medication, assess for effectiveness within 30-45 minutes, and if needed he could have IV medication at that time.  Pt became irrated, asked me to "give him the medication and get out of his room".  I did so and upon leaving the room the patient perceived that I slammed the door and began yelling for the CN.  Pt very aggressive and unable to reason, CN Renita made aware and assignment switched.  Sign off.

## 2022-04-24 NOTE — Progress Notes (Signed)
Patient left unit without off unit privileges. Patient was found smoking outside. Cigarettes and lighter placed in patient bag with patient label and placed on chart. MD notified and aware.

## 2022-04-24 NOTE — Progress Notes (Signed)
Progress Note    Abdihafid Landmark   FHL:[id number]  DOB: [birth year]  DOA: 04/21/2022     3 PCP: Pcp, No  Initial CC: concern for SOB/airway compromise  Hospital Course: Mr. 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  Also has adenopathy consistent with nodal metastasis.  Also PMH of HIV and ongoing tobacco use despite diagnosis.  He has been recommended for PET scan for further evaluation to help guide radiation treatments. He had ultimately initially presented due to shortness of breath and pain in his left neck especially with swallowing.  He was then transferred to [facility] for ENT evaluation for possible tracheostomy but he declined procedure and was therefore transferred back to [facility] for possible chemo and radiation.  Interval History:  Patient resting in bed comfortably when seen this morning.  No difficulty breathing.  Still having some pain with swallowing which she understands is from his underlying cancer.  Unfortunately he was also found by staff leaving the unit later in the morning to go outside to smoke.  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  Large tumor of left tonsil extending into nasopharynx and base of tongue -Patient continues to smoke - Some odynophagia but able to adequately swallow solids and liquids - patient has PET scan 4/18 - will work on pain control some today and d/c likely Tues  Normocytic anemia - Baseline hemoglobin around 11 to 12 g/dL - Currently at baseline  HIV disease - Compliant with treatment, continue Symtuza -CD4 225 on 04/24/2022 -HIV viral load 30 on 04/22/2022.  Previously undetectable  Airway compromise-resolved as of 04/24/2022 - Patient declined tracheostomy placement; evaluated by ENT.  He is recommended for outpatient follow-up as necessary for any further discussions regarding trach -Currently no  signs of airway compromise   Old records reviewed in assessment of this patient  Antimicrobials:   DVT prophylaxis:  heparin injection 5,000 Units Start: 04/22/22 1400 SCDs Start: 04/21/22 2353   Code Status:   Code Status: Full Code  Mobility Assessment (last 72 hours)     Mobility Assessment     Row Name 04/24/22 0900 04/23/22 2043 04/23/22 0000 04/22/22 2000     Does patient have an order for bedrest or is patient medically unstable No - Continue assessment No - Continue assessment No - Continue assessment No - Continue assessment    What is the highest level of mobility based on the progressive mobility assessment? Level 6 (Walks independently in room and hall) - Balance while walking in room without assist - Complete Level 5 (Walks with assist in room/hall) - Balance while stepping forward/back and can walk in room with assist - Complete Level 5 (Walks with assist in room/hall) - Balance while stepping forward/back and can walk in room with assist - Complete Level 5 (Walks with assist in room/hall) - Balance while stepping forward/back and can walk in room with assist - Complete             Barriers to discharge:  Disposition Plan:  Home Status is: Inpt  Objective: Blood pressure 136/75, pulse 80, temperature (!) 97.5 F (36.4 C), temperature source Oral, resp. rate 14, height 6' (1.829 m), weight 72.5 kg, SpO2 100 %.  Examination:  Physical Exam Constitutional:      General: He is not in acute distress.    Appearance: Normal appearance.  HENT:  Head: Normocephalic and atraumatic.     Mouth/Throat:     Mouth: Mucous membranes are dry.  Eyes:     Extraocular Movements: Extraocular movements intact.  Neck:     Comments: R neck noted with lipoma Cardiovascular:     Rate and Rhythm: Normal rate and regular rhythm.     Heart sounds: Normal heart sounds.  Pulmonary:     Effort: Pulmonary effort is normal. No respiratory distress.     Breath sounds: Normal  breath sounds. No wheezing.  Abdominal:     General: Bowel sounds are normal. There is no distension.     Palpations: Abdomen is soft.     Tenderness: There is no abdominal tenderness.  Musculoskeletal:        General: Normal range of motion.     Cervical back: Tenderness (along left lateral neck) present.  Skin:    General: Skin is warm and dry.  Neurological:     General: No focal deficit present.     Mental Status: He is alert.  Psychiatric:        Mood and Affect: Mood normal.        Behavior: Behavior normal.      Consultants:  Oncology Rad onc  Procedures:    Data Reviewed: Results for orders placed or performed during the hospital encounter of 04/21/22 (from the past 24 hour(s))  T-helper cells (CD4) count (not at [facility])     Status: Abnormal   Collection Time: 04/24/22  8:48 AM  Result Value Ref Range   CD4 T Cell Abs 225 (L) 400 - 1,790 /uL   CD4 % Helper T Cell 20 (L) 33 - 65 %    I have reviewed pertinent nursing notes, vitals, labs, and images as necessary. I have ordered labwork to follow up on as indicated.  I have reviewed the last notes from staff over past 24 hours. I have discussed patient's care plan and test results with nursing staff, CM/SW, and other staff as appropriate.  Time spent: Greater than 50% of the 55 minute visit was spent in counseling/coordination of care for the patient as laid out in the A&P.   LOS: 3 days   Lewie Chamber, MD [facility] 04/24/2022, 1:26 PM

## 2022-04-24 NOTE — Assessment & Plan Note (Signed)
-   Compliant with treatment, continue Symtuza -CD4 225 on 04/24/2022 -HIV viral load 30 on 04/22/2022.  Previously undetectable

## 2022-04-24 NOTE — Assessment & Plan Note (Signed)
-   Baseline hemoglobin around 11 to 12 g/dL -Currently at baseline 

## 2022-04-24 NOTE — Progress Notes (Signed)
Radiation Oncology         ([phone]) [phone] ________________________________  Initial inpatient Consultation  Name: [name] MRN: [id number]  Date of Service: 04/24/2022 DOB: 25-Apr-1960  CC:Pcp, No  [name], MD   REFERRING PHYSICIAN: [name], MD  DIAGNOSIS: There were no encounter diagnoses.  No diagnosis found.  HISTORY OF PRESENT ILLNESS: [name] is a 62 y.o. male with a past medical history significant for HIV (well controlled). seen at the request of Dr. Roxy Horseman. The patient initially presented to a provider located in [place] this past February for evaluation of a new right neck mass and sore throat. He was subsequently referred to ENT in [place] and had a soft tissue neck CT performed on 03/03/22 which demonstrated a large left neck mass and a tonsillar mass. Involvement of the left soft palate and possible invasion of the left tongue base were also appreciated. The scanned report is partially unreadable due to low quality, however findings detailed a a 6.1 cm mass-like area of matted left level 2 and level 3 lymph nodes with areas of necrosis, and a 6.2 cm fatty neoplasm posterior to the right sternocleidomastoid muscle.  The patient was then referred to Dr. Arlester Marker at [facility] for further management. Dr. Arlester Marker ordered a biopsy of the left neck mass which was performed on 03/15/22 and showed metastatic squamous cell carcinoma; p16 positive.  He was advised to have a PET scan performed to complete staging work-up but was not able to due to financial limitations. He was however able to have a CT performed on 04/05/22 which demonstrated a polypoid soft tissue mass in the left mainstem bronchus concerning for malignancy, measuring 2.4 cm in the greatest extent. CT also showed nonspecific small precarinal and right hilar lymph nodes but no definite evidence of metastatic disease in the abdomen or pelvis.   Regarding treatment  options, Dr. Arlester Marker recommends concurrent chemoradiation with cisplatin.    The patient was accordingly referred to Dr. Annie Main, [facility], on 04/10/22 to discuss to role of XRT. During this visit, the patient also reported some mild odynophagia with subsequent weight loss. The patient has relocated back to [place] given that most of his family/support system resides here. Given this, Dr. Annie Main has referred him to [place] so that he may receive treatment closer to home.  Patient was scheduled for a consult with [place] on 04/21/22, but presented to the ED that day for increasing pain and swelling in his neck. Patient also complained of difficulty swallowing and shortness of breath at the time. He was admitted for pain control and further treatment. Patient was started on decadron for symptom management.   We were kindly consulted to discuss radiation treatment options.  Today at bedside the patient denies any issues with breathing or swallowing. He is able to eat food regularly and denies any dyspnea. He states that since receiving Decadron the swelling in his neck has significantly decreased. He is still experiencing a significant amount of pain. He just received  of Dilaudid at bedside. Reports his pain is a 10/10 after talking so much with doctors and family.   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   Hemorrhoids    HIV (human immunodeficiency virus infection)    Stenosis of cervical spine    withb myelopathy      PAST SURGICAL HISTORY: Past Surgical History:  Procedure Laterality Date  ANTERIOR CERVICAL DECOMP/DISCECTOMY FUSION N/A 07/06/2017   Procedure: ANTERIOR CERVICAL DECOMPRESSION/DISCECTOMY FUSION C3-4/C4-5 2 LEVELS;  Surgeon: Lisbeth Renshaw, MD;  Location: [facility] OR;  Service: Neurosurgery;  Laterality: N/A;   HERNIA REPAIR  97   lt ing   RECTAL EXAM UNDER ANESTHESIA N/A 04/24/2014    Procedure:  ligation of bleeding vessels;  Surgeon: Almond Lint, MD;  Location: [facility];  Service: General;  Laterality: N/A;   TENDON REPAIR     2006-lt index   WART FULGURATION  07/06/2011   Procedure: FULGURATION ANAL WART;  Surgeon: Shelly Rubenstein, MD;  Location: [facility];  Service: General;  Laterality: N/A;  excision anal condyloma   WART FULGURATION N/A 04/23/2014   Procedure: EXCISION OF ANAL CONDYLOMA;  Surgeon: Abigail Miyamoto, MD;  Location: [facility];  Service: General;  Laterality: N/A;    FAMILY HISTORY:  Family History  Problem Relation Age of Onset   Cancer Mother        brain   Cancer Sister        breast    SOCIAL HISTORY:  Social History   Socioeconomic History   Marital status: Single    Spouse name: Not on file   Number of children: Not on file   Years of education: Not on file   Highest education level: Not on file  Occupational History   Occupation: Unemployed  Tobacco Use   Smoking status: Every Day    Packs/day: 0.50    Years: 25.00    Additional pack years: 0.00    Total pack years: 12.50    Types: Cigarettes   Smokeless tobacco: Never  Vaping Use   Vaping Use: Never used  Substance and Sexual Activity   Alcohol use: Yes    Alcohol/week: 24.0 standard drinks of alcohol    Types: 24 Cans of beer per week    Comment: ''I can drink a case a day''   Drug use: Yes    Types: "Crack" cocaine    Comment: pt reports last use was Wednesday   Sexual activity: Not Currently    Comment: pt declined condoms 09/22/19  Other Topics Concern   Not on file  Social History Narrative   Pt lives alone; receives Tree surgeon.   Social Determinants of Health   Financial Resource Strain: Not on file  Food Insecurity: Not on file  Transportation Needs: Not on file  Physical Activity: Not on file  Stress: Not on file  Social Connections: Not on file  Intimate Partner Violence: Not on file    ALLERGIES: Shellfish allergy, Sulfa  antibiotics, Fish allergy, and Clindamycin  MEDICATIONS:  No current facility-administered medications for this encounter.   No current outpatient medications on file.   Facility-Administered Medications Ordered in Other Encounters  Medication Dose Route Frequency Provider Last Rate Last Admin   aspirin chewable tablet 81 mg  81 mg Oral Daily Omar Person, MD   81 mg at 04/24/22 1610   baclofen (LIORESAL) tablet 20 mg  20 mg Oral TID PRN Omar Person, MD       Chlorhexidine Gluconate Cloth 2 % PADS 6 each  6 each Topical Q0600 Icard, Bradley L, DO   6 each at 04/22/22 0513   cloNIDine (CATAPRES) tablet 0.1 mg  0.1 mg Oral BID Omar Person, MD   0.1 mg at 04/24/22 9604   Darunavir-Cobicistat-Emtricitabine-Tenofovir Alafenamide (SYMTUZA) 800-150-200-10 MG TABS 1 tablet  1 tablet Oral Q breakfast Omar Person, MD  1 tablet at 04/24/22 0852   dexamethasone (DECADRON) injection 6 mg  6 mg Intravenous Daily Omar Person, MD   6 mg at 04/24/22 4098   docusate sodium (COLACE) capsule 100 mg  100 mg Oral BID PRN Cloyd Stagers M, PA-C       gabapentin (NEURONTIN) capsule 800 mg  800 mg Oral TID Omar Person, MD   800 mg at 04/24/22 1511   heparin injection 5,000 Units  5,000 Units Subcutaneous Q8H Cloyd Stagers M, [place]   5,000 Units at 04/22/22 1420   HYDROcodone-acetaminophen (NORCO/VICODIN) 5-325 MG per tablet 1 tablet  1 tablet Oral Q4H PRN Omar Person, MD   1 tablet at 04/24/22 1734   HYDROmorphone (DILAUDID) injection 1 mg  1 mg Intravenous Q2H PRN Lewie Chamber, MD   1 mg at 04/24/22 1513   magic mouthwash w/lidocaine  15 mL Oral QID Lewie Chamber, MD   15 mL at 04/24/22 1734   Oral care mouth rinse  15 mL Mouth Rinse PRN Icard, Bradley L, DO       polyethylene glycol (MIRALAX / GLYCOLAX) packet 17 g  17 g Oral Daily Omar Person, MD       rosuvastatin (CRESTOR) tablet 5 mg  5 mg Oral Daily Omar Person, MD   5 mg at 04/24/22 1191     REVIEW OF SYSTEMS:  As per HPI.     PHYSICAL EXAM:  Wt Readings from Last 3 Encounters:  04/24/22 159 lb 13.3 oz (72.5 kg)  03/01/21 162 lb 6.4 oz (73.7 kg)  05/09/20 160 lb (72.6 kg)   Temp Readings from Last 3 Encounters:  04/24/22 97.8 F (36.6 C) (Oral)  05/23/21 98.8 F (37.1 C) (Oral)  03/01/21 98.5 F (36.9 C) (Oral)   BP Readings from Last 3 Encounters:  04/24/22 124/72  05/23/21 106/79  03/01/21 135/84   Pulse Readings from Last 3 Encounters:  04/24/22 69  05/23/21 88  03/01/21 92    /10  In general this is a well appearing male in no acute distress. He is alert and oriented x4 and appropriate throughout the examination. HEENT reveals that the patient is normocephalic, atraumatic. EOMs are intact. PERRLA. A hard, immobile mass approximately 3 cm in diameter was palpated in his left upper, anterior neck. Large, nontender seroma on his posterior right, lower neck. Skin is intact without any evidence of gross lesions. Cardiovascular exam reveals a regular rate and rhythm, no clicks rubs or murmurs are auscultated. Chest is clear to auscultation bilaterally.    KPS = 70  100 - Normal; no complaints; no evidence of disease. 90   - Able to carry on normal activity; minor signs or symptoms of disease. 80   - Normal activity with effort; some signs or symptoms of disease. 51   - Cares for self; unable to carry on normal activity or to do active work. 60   - Requires occasional assistance, but is able to care for most of his personal needs. 50   - Requires considerable assistance and frequent medical care. 40   - Disabled; requires special care and assistance. 30   - Severely disabled; hospital admission is indicated although death not imminent. 20   - Very sick; hospital admission necessary; active supportive treatment necessary. 10   - Moribund; fatal processes progressing rapidly. 0     - Dead  Karnofsky DA, Abelmann WH, Craver LS and Burchenal [facility] [phone]) The use  of the nitrogen mustards  in the palliative treatment of carcinoma: with particular reference to bronchogenic carcinoma Cancer 1 634-56  LABORATORY DATA:  Lab Results  Component Value Date   WBC 5.3 04/22/2022   HGB 11.2 (L) 04/22/2022   HCT 33.3 (L) 04/22/2022   MCV 81.8 04/22/2022   PLT 413 (H) 04/22/2022   Lab Results  Component Value Date   NA 130 (L) 04/22/2022   K 4.4 04/22/2022   CL 94 (L) 04/22/2022   CO2 26 04/22/2022   Lab Results  Component Value Date   ALT 21 04/21/2022   AST 31 04/21/2022   ALKPHOS 44 04/21/2022   BILITOT 0.6 04/21/2022     RADIOGRAPHY: DG Chest Port 1 View  Result Date: 04/22/2022 CLINICAL DATA:  Airway compromise EXAM: PORTABLE CHEST 1 VIEW COMPARISON:  05/09/2020 FINDINGS: Normal heart size and mediastinal contours. There is no edema, consolidation, effusion, or pneumothorax. Mild atelectasis at the lung bases. Ossified body in the sub coracoid recess on the right. IMPRESSION: Mild atelectasis at the lung bases. Electronically Signed   By: Tiburcio Pea M.D.   On: 04/22/2022 05:07   CT Soft Tissue Neck W Contrast  Result Date: 04/21/2022 CLINICAL DATA:  Initial evaluation for head neck cancer. EXAM: CT NECK WITH CONTRAST TECHNIQUE: Multidetector CT imaging of the neck was performed using the standard protocol following the bolus administration of intravenous contrast. RADIATION DOSE REDUCTION: This exam was performed according to the departmental dose-optimization program which includes automated exposure control, adjustment of the mA and/or kV according to patient size and/or use of iterative reconstruction technique. CONTRAST:  18mL OMNIPAQUE IOHEXOL 300 MG/ML  SOLN COMPARISON:  None Available. FINDINGS: Pharynx and larynx: Ill-defined mass centered at the left palatine tonsil is seen. Lesion is difficult to measure given the infiltrative and poorly defined nature of this lesion, but measures approximately 4.0 x 5.1 x 5.4 cm in greatest trans axial  dimensions. Lesion appears to cross the midline, abutting and involving the uvula. Anterior extension to involve the floor of mouth and base of tongue. Additional superior extension towards the left nasopharynx. Oropharyngeal airway is narrowed and deviated to the right at the level of the lesion. No retropharyngeal collection. Epiglottis within normal limits. Remainder of the hypopharynx and supraglottic larynx within normal limits. Negative glottis. Subglottic airway clear. Salivary glands: Salivary glands including the parotid and submandibular glands are within normal limits. Thyroid: Normal. Lymph nodes: Extensive abnormal bulky adenopathy seen at left level 2, with largest nodal conglomerate measuring 3.7 x 3.8 cm (series 2, image 46). Scattered areas of internal hypodensity consistent with necrosis. Mildly prominent left level 1 B nodes measure up to 1 cm, also potentially involved (series 2, image 54). No right-sided adenopathy. Vascular: Normal intravascular enhancement seen throughout the neck. Mild atheromatous change about the carotid bifurcations. Strongly dominant right vertebral artery noted. Left IJ is compressed at the level of the left cervical adenopathy. Limited intracranial: Unremarkable. Visualized orbits: Unremarkable. Mastoids and visualized paranasal sinuses: Visualized paranasal sinuses are clear. Mastoid air cells and middle ear cavities are well pneumatized and free of fluid. Skeleton: No discrete or worrisome osseous lesions. Prior ACDF at C3 through C5. Advanced spondylosis present at C5-6 and C6-7 with prominent anterior endplate osteophytic spurring. Upper chest: Mild emphysematous changes.  No acute findings. Other: Heterogeneous fat density mass measuring up to 5 cm present at the right posterolateral neck (series 2, image 41). While this could reflect a benign lipoma, internal heterogeneity is greater than is typically seen. IMPRESSION: 1. Approximate  4.0 x 5.1 x 5.4 cm mass  centered at the left palatine tonsil, consistent with primary head and neck malignancy. 2. Abnormal bulky left level 2 adenopathy, consistent with nodal metastases. Additional prominent left level 1b nodes may be involved as well. 3. 5 cm fat density mass at the right posterolateral neck, indeterminate. While this could reflect a benign lipoma, internal heterogeneity is greater than is typically seen. Correlation with physical exam recommended. Further evaluation dedicated nonemergent MRI could be performed for further evaluation as warranted. 4. Prior ACDF at C3 through C5 with advanced spondylosis at C5-6 and C6-7. Emphysema (ICD10-J43.9). Electronically Signed   By: Rise Mu M.D.   On: 04/21/2022 21:22      IMPRESSION/PLAN: 62 y.o. 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  It was a pleasure meeting this patient today. Plans are to discharge the patient today. He is scheduled for a PET scan on 04/27/22 for a full workup. He may also benefit from a bronchoscopy and biopsy to the mass in the left mainstem bronchus. Patient is scheduled to meet with Dr. Al Pimple on 05/04/22. Dr. Basilio Cairo is in agreement with concurrent chemoradiation at this time. Given the size of his neck mass, he may begin chemotherapy prior to radiation in order to decrease the size of his tumor initially. We will discuss his case at tumor board on 04/26/22.   We discussed the potential risks, benefits, and side effects of radiotherapy. We talked in detail about acute and late effects. We discussed that some of the most bothersome acute effects may be mucositis, dysgeusia, salivary changes, skin irritation, hair loss, dehydration, weight loss and fatigue. We talked about late effects which include but are not necessarily limited to dysphagia, hypothyroidism, nerve injury, vascular injury, spinal cord injury, xerostomia, trismus, neck edema, and potential injury to any of the tissues in the head  and neck region. No guarantees of treatment were given. A consent form was signed and placed in the patient's medical record. The patient is enthusiastic about proceeding with treatment. I look forward to participating in the patient's care.   Simulation (treatment planning) will take place after staging workup has been complete.  We also discussed that the treatment of head and neck cancer is a multidisciplinary process to maximize treatment outcomes and quality of life. For this reason the following referrals have been or will be made:   Dentistry for dental evaluation, possible extractions in the radiation fields, and /or advice on reducing risk of cavities, osteoradionecrosis, or other oral issues. Patient does not have dentures.    Nutritionist for nutrition support during and after treatment. Patient was educated on possible PEG tube placement.    Speech language pathology for swallowing and/or speech therapy.   Social work for social support.    Physical therapy due to risk of lymphedema in neck and deconditioning.  Baseline labs including TSH.  Patient states he is smoking approximately 2 cigarettes per day. We had a lengthy discussion about the benefits of smoking cessation. Patient is currently taking Wellbutrin for his depression. We talked about slowly decreasing the amount of cigarettes he smokes per day and eventually starting nicotine patches as a replacement method. Patient expressed understanding of this plan.   I personally spent 60 minutes in this encounter including chart review, reviewing radiological studies, meeting face-to-face with the patient, entering orders and completing documentation.  -----------------------------------   Joyice Faster, PA-C   Lonie Peak, MD

## 2022-04-24 NOTE — Progress Notes (Signed)
Patient was noted to not be in room. Patient was coming off of elevator with coffee in hand. Explained to patient that he is not allowed off the unit. Patient also noted to be in street clothes and wearing a guest pass on his shirt. Staff asked him to remove tag. Patient removed tag and verbalized understanding not to leave the unit.

## 2022-04-25 ENCOUNTER — Other Ambulatory Visit: Payer: Self-pay | Admitting: Internal Medicine

## 2022-04-25 DIAGNOSIS — C090 Malignant neoplasm of tonsillar fossa: Secondary | ICD-10-CM | POA: Insufficient documentation

## 2022-04-25 DIAGNOSIS — J988 Other specified respiratory disorders: Secondary | ICD-10-CM | POA: Diagnosis not present

## 2022-04-25 DIAGNOSIS — C4492 Squamous cell carcinoma of skin, unspecified: Secondary | ICD-10-CM | POA: Diagnosis not present

## 2022-04-25 DIAGNOSIS — B20 Human immunodeficiency virus [HIV] disease: Secondary | ICD-10-CM | POA: Diagnosis not present

## 2022-04-25 MED ORDER — HYDROCODONE-ACETAMINOPHEN 5-325 MG PO TABS
2.0000 | ORAL_TABLET | ORAL | Status: DC | PRN
  Administered 2022-04-25: 2 via ORAL
  Filled 2022-04-25: qty 2

## 2022-04-25 MED ORDER — NYSTATIN 100000 UNIT/ML MT SUSP: 15.0000 mL | Freq: Three times a day (TID) | OROMUCOSAL | 2 refills | Status: DC | PRN

## 2022-04-25 MED ORDER — HYDROCODONE-ACETAMINOPHEN 5-325 MG PO TABS: 2.0000 | ORAL_TABLET | ORAL | 0 refills | Status: DC | PRN

## 2022-04-25 MED ORDER — DEXAMETHASONE 6 MG PO TABS: 6.0000 mg | ORAL_TABLET | Freq: Every day | ORAL | 0 refills | Status: AC

## 2022-04-25 MED ORDER — HYDROCODONE-ACETAMINOPHEN 5-325 MG PO TABS: 1.0000 | ORAL_TABLET | Freq: Four times a day (QID) | ORAL | 0 refills | Status: DC | PRN

## 2022-04-25 NOTE — Discharge Summary (Signed)
Physician Discharge Summary   [name]:[id number] DOB: Apr 20, 1960 DOA: 04/21/2022  PCP: Pcp, No  Admit date: 04/21/2022 Discharge date: 04/25/2022   Admitted From: Home Disposition:  Home Discharging physician: Lewie Chamber, MD Barriers to discharge: none  Recommendations at discharge: PET scan 4/18 then radiation simulation and treatments to follow PCP trying to be arranged at d/c Referral to palliative care placed, esp for any symptom control    Discharge Condition: stable CODE STATUS: Full Diet recommendation:  Diet Orders (From admission, onward)     Start     Ordered   04/25/22 0000  Diet general        04/25/22 1153   04/22/22 0829  Diet regular Room service appropriate? Yes; Fluid consistency: Thin  Diet effective now       Question Answer Comment  Room service appropriate? 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  Also has adenopathy consistent with nodal metastasis.  Also PMH of HIV and ongoing tobacco use despite diagnosis.  He has been recommended for PET scan for further evaluation to help guide radiation treatments. He had ultimately initially presented due to shortness of breath and pain in his left neck especially with swallowing.  He was then transferred to [facility] for ENT evaluation for possible tracheostomy but he declined procedure and was therefore transferred back to [facility] for possible chemo and radiation. He was able to be evaluated by radiation oncology and medical oncology.  Arrangements were made for outpatient follow-ups especially for PET scan, after which time he will be able to have simulation for radiation and beginning treatments. Pain control was pursued during hospitalization.  He was able to tolerate a diet fairly well.  He was discharged with Magic mouthwash, pain control, and  referral to palliative care for ongoing symptom management at discharge.  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  Large tumor of left tonsil extending into nasopharynx and base of tongue -Patient continues to smoke; cessation has been strongly reinforced  - Some odynophagia but able to adequately swallow solids and liquids - patient has PET scan 4/18 - given norco, MMW, and referral for palliative care at d/c   Normocytic anemia - Baseline hemoglobin around 11 to 12 g/dL - Currently at baseline  HIV disease - Compliant with treatment, continue Symtuza -CD4 225 on 04/24/2022 -HIV viral load 30 on 04/22/2022.  Previously undetectable  Airway compromise-resolved as of 04/24/2022 - Patient declined tracheostomy placement; evaluated by ENT.  He is recommended for outpatient follow-up as necessary for any further discussions regarding trach -Currently no signs of airway compromise   The patient's chronic medical conditions were treated accordingly per the patient's home medication regimen except as noted.  On day of discharge, patient was felt deemed stable for discharge. Patient/family member advised to call PCP or come back to ER if needed.   Principal Diagnosis: SCC (squamous cell carcinoma)  Discharge Diagnoses: Active Hospital Problems   Diagnosis Date Noted   SCC (squamous cell carcinoma) 04/24/2022    Priority: 1.   Normocytic anemia 04/24/2022   HIV disease 11/09/2008    Resolved Hospital Problems   Diagnosis Date Noted Date Resolved   Airway compromise 04/21/2022 04/24/2022  Discharge Instructions     Amb Referral to Palliative Care   Complete by: As directed    Diet general   Complete by: As directed    Increase activity slowly   Complete by: As directed       Allergies as of 04/25/2022       Reactions   Shellfish Allergy Anaphylaxis, Swelling   Swelling everywhere   Sulfa Antibiotics  Anaphylaxis, Hives, Itching, Swelling   Swelling all over    Fish Allergy Swelling   Clindamycin Diarrhea        Medication List     STOP taking these medications    fluconazole 150 MG tablet Commonly known as: DIFLUCAN   topiramate 100 MG tablet Commonly known as: TOPAMAX   traMADol 50 MG tablet Commonly known as: ULTRAM       TAKE these medications    aprepitant 80 & 125 MG capsule Commonly known as: EMEND Take by mouth.   aspirin EC 81 MG tablet Take 81 mg by mouth daily.   baclofen 20 MG tablet Commonly known as: LIORESAL Take 20 mg by mouth 3 (three) times daily as needed for muscle spasms.   buPROPion 300 MG 24 hr tablet Commonly known as: WELLBUTRIN XL Take 300 mg by mouth daily.   cloNIDine 0.1 MG tablet Commonly known as: CATAPRES Take 0.1 mg by mouth 2 (two) times daily.   dexamethasone 6 MG tablet Commonly known as: DECADRON Take 1 tablet (6 mg total) by mouth daily for 14 days.   diphenhydrAMINE 25 MG tablet Commonly known as: BENADRYL Take 25 mg by mouth every 6 (six) hours as needed for sleep.   gabapentin 800 MG tablet Commonly known as: NEURONTIN Take 800 mg by mouth 3 (three) times daily. What changed: Another medication with the same name was removed. Continue taking this medication, and follow the directions you see here.   granisetron 1 MG tablet Commonly known as: KYTRIL Take by mouth.   HYDROcodone-acetaminophen 5-325 MG tablet Commonly known as: NORCO/VICODIN Take 2 tablets by mouth every 4 (four) hours as needed for severe pain.   losartan 25 MG tablet Commonly known as: COZAAR Take 25 mg by mouth daily.   magic mouthwash (nystatin, lidocaine, diphenhydrAMINE, alum & mag hydroxide) suspension Swish and swallow 15 mLs 3 (three) times daily as needed for mouth pain.   rosuvastatin 5 MG tablet Commonly known as: CRESTOR Take 5 mg by mouth daily.   Symtuza 800-150-200-10 MG Tabs Generic drug:  Darunavir-Cobicistat-Emtricitabine-Tenofovir Alafenamide Take 1 tablet by mouth daily with breakfast.        Follow-up Information     Primary Care Provider Follow up.   Why: We have tried to schedule you with a primary care provider. Right now we are waiting on return calls. Please feel free to seek your own primary care provider. Case manager will call when follow up appoinmtment is secured. Contact information: [facility] [phone]               Allergies  Allergen Reactions   Shellfish Allergy Anaphylaxis and Swelling    Swelling everywhere   Sulfa Antibiotics Anaphylaxis, Hives, Itching and Swelling    Swelling all over    Fish Allergy Swelling   Clindamycin Diarrhea    Consultations: Medical oncology Rad onc  Procedures:   Discharge Exam: BP 134/84 (BP Location: Right Arm)   Pulse 75   Temp 97.9 F (36.6 C) (Oral)   Resp 18   Ht  6' (1.829 m)   Wt 72.5 kg   SpO2 97%   BMI 21.68 kg/m  Physical Exam Constitutional:      General: He is not in acute distress.    Appearance: Normal appearance.  HENT:     Head: Normocephalic and atraumatic.     Mouth/Throat:     Mouth: Mucous membranes are dry.  Eyes:     Extraocular Movements: Extraocular movements intact.  Neck:     Comments: R neck noted with lipoma Cardiovascular:     Rate and Rhythm: Normal rate and regular rhythm.     Heart sounds: Normal heart sounds.  Pulmonary:     Effort: Pulmonary effort is normal. No respiratory distress.     Breath sounds: Normal breath sounds. No wheezing.  Abdominal:     General: Bowel sounds are normal. There is no distension.     Palpations: Abdomen is soft.     Tenderness: There is no abdominal tenderness.  Musculoskeletal:        General: Normal range of motion.     Cervical back: Tenderness (along left lateral neck) present.  Skin:    General: Skin is warm and dry.  Neurological:     General: No focal deficit present.     Mental  Status: He is alert.  Psychiatric:        Mood and Affect: Mood normal.        Behavior: Behavior normal.      The results of significant diagnostics from this hospitalization (including imaging, microbiology, ancillary and laboratory) are listed below for reference.   Microbiology: Recent Results (from the past 240 hour(s))  MRSA Next Gen by PCR, Nasal     Status: None   Collection Time: 04/22/22  4:45 AM   Specimen: Nasal Mucosa; Nasal Swab  Result Value Ref Range Status   MRSA by PCR Next Gen NOT DETECTED NOT DETECTED Final    Comment: (NOTE) The GeneXpert MRSA Assay (FDA approved for NASAL specimens only), is one component of a comprehensive MRSA colonization surveillance program. It is not intended to diagnose MRSA infection nor to guide or monitor treatment for MRSA infections. Test performance is not FDA approved in patients less than 42 years old. Performed at [facility], [address] [place], [place] [address]      Labs: BNP (last 3 results) No results for input(s): "BNP" in the last 8760 hours. Basic Metabolic Panel: Recent Labs  Lab 04/21/22 1957 04/22/22 0741  NA 127* 130*  K 4.0 4.4  CL 94* 94*  CO2 24 26  GLUCOSE 108* 123*  BUN 16 11  CREATININE 1.10 0.83  CALCIUM 8.8* 9.2  MG  --  2.3   Liver Function Tests: Recent Labs  Lab 04/21/22 1957  AST 31  ALT 21  ALKPHOS 44  BILITOT 0.6  PROT 7.4  ALBUMIN 3.9   No results for input(s): "LIPASE", "AMYLASE" in the last 168 hours. No results for input(s): "AMMONIA" in the last 168 hours. CBC: Recent Labs  Lab 04/21/22 1957 04/22/22 0741  WBC 8.5 5.3  NEUTROABS 5.8  --   HGB 10.2* 11.2*  HCT 31.6* 33.3*  MCV 82.1 81.8  PLT 418* 413*   Cardiac Enzymes: No results for input(s): "CKTOTAL", "CKMB", "CKMBINDEX", "TROPONINI" in the last 168 hours. BNP: Invalid input(s): "POCBNP" CBG: No results for input(s): "GLUCAP" in the last 168 hours. D-Dimer No results for input(s): "DDIMER" in  the last 72 hours. Hgb A1c No results  for input(s): "HGBA1C" in the last 72 hours. Lipid Profile No results for input(s): "CHOL", "HDL", "LDLCALC", "TRIG", "CHOLHDL", "LDLDIRECT" in the last 72 hours. Thyroid function studies No results for input(s): "TSH", "T4TOTAL", "T3FREE", "THYROIDAB" in the last 72 hours.  Invalid input(s): "FREET3" Anemia work up No results for input(s): "VITAMINB12", "FOLATE", "FERRITIN", "TIBC", "IRON", "RETICCTPCT" in the last 72 hours. Urinalysis    Component Value Date/Time   COLORURINE STRAW (A) 08/28/2019 1055   APPEARANCEUR CLEAR 08/28/2019 1055   LABSPEC 1.003 (L) 08/28/2019 1055   PHURINE 5.0 08/28/2019 1055   GLUCOSEU NEGATIVE 08/28/2019 1055   HGBUR SMALL (A) 08/28/2019 1055   BILIRUBINUR NEGATIVE 08/28/2019 1055   KETONESUR NEGATIVE 08/28/2019 1055   PROTEINUR NEGATIVE 08/28/2019 1055   UROBILINOGEN 0.2 05/08/2011 0915   NITRITE NEGATIVE 08/28/2019 1055   LEUKOCYTESUR NEGATIVE 08/28/2019 1055   Sepsis Labs Recent Labs  Lab 04/21/22 1957 04/22/22 0741  WBC 8.5 5.3   Microbiology Recent Results (from the past 240 hour(s))  MRSA Next Gen by PCR, Nasal     Status: None   Collection Time: 04/22/22  4:45 AM   Specimen: Nasal Mucosa; Nasal Swab  Result Value Ref Range Status   MRSA by PCR Next Gen NOT DETECTED NOT DETECTED Final    Comment: (NOTE) The GeneXpert MRSA Assay (FDA approved for NASAL specimens only), is one component of a comprehensive MRSA colonization surveillance program. It is not intended to diagnose MRSA infection nor to guide or monitor treatment for MRSA infections. Test performance is not FDA approved in patients less than 68 years old. Performed at [facility], [address], [place], [place] [address]     Procedures/Studies: DG Chest Port 1 View  Result Date: 04/22/2022 CLINICAL DATA:  Airway compromise EXAM: PORTABLE CHEST 1 VIEW COMPARISON:  05/09/2020 FINDINGS: Normal heart size and mediastinal  contours. There is no edema, consolidation, effusion, or pneumothorax. Mild atelectasis at the lung bases. Ossified body in the sub coracoid recess on the right. IMPRESSION: Mild atelectasis at the lung bases. Electronically Signed   By: Tiburcio Pea M.D.   On: 04/22/2022 05:07   CT Soft Tissue Neck W Contrast  Result Date: 04/21/2022 CLINICAL DATA:  Initial evaluation for head neck cancer. EXAM: CT NECK WITH CONTRAST TECHNIQUE: Multidetector CT imaging of the neck was performed using the standard protocol following the bolus administration of intravenous contrast. RADIATION DOSE REDUCTION: This exam was performed according to the departmental dose-optimization program which includes automated exposure control, adjustment of the mA and/or kV according to patient size and/or use of iterative reconstruction technique. CONTRAST:  75mL OMNIPAQUE IOHEXOL 300 MG/ML  SOLN COMPARISON:  None Available. FINDINGS: Pharynx and larynx: Ill-defined mass centered at the left palatine tonsil is seen. Lesion is difficult to measure given the infiltrative and poorly defined nature of this lesion, but measures approximately 4.0 x 5.1 x 5.4 cm in greatest trans axial dimensions. Lesion appears to cross the midline, abutting and involving the uvula. Anterior extension to involve the floor of mouth and base of tongue. Additional superior extension towards the left nasopharynx. Oropharyngeal airway is narrowed and deviated to the right at the level of the lesion. No retropharyngeal collection. Epiglottis within normal limits. Remainder of the hypopharynx and supraglottic larynx within normal limits. Negative glottis. Subglottic airway clear. Salivary glands: Salivary glands including the parotid and submandibular glands are within normal limits. Thyroid: Normal. Lymph nodes: Extensive abnormal bulky adenopathy seen at left level 2, with largest nodal conglomerate measuring  3.7 x 3.8 cm (series 2, image 46). Scattered areas of  internal hypodensity consistent with necrosis. Mildly prominent left level 1 B nodes measure up to 1 cm, also potentially involved (series 2, image 54). No right-sided adenopathy. Vascular: Normal intravascular enhancement seen throughout the neck. Mild atheromatous change about the carotid bifurcations. Strongly dominant right vertebral artery noted. Left IJ is compressed at the level of the left cervical adenopathy. Limited intracranial: Unremarkable. Visualized orbits: Unremarkable. Mastoids and visualized paranasal sinuses: Visualized paranasal sinuses are clear. Mastoid air cells and middle ear cavities are well pneumatized and free of fluid. Skeleton: No discrete or worrisome osseous lesions. Prior ACDF at C3 through C5. Advanced spondylosis present at C5-6 and C6-7 with prominent anterior endplate osteophytic spurring. Upper chest: Mild emphysematous changes.  No acute findings. Other: Heterogeneous fat density mass measuring up to 5 cm present at the right posterolateral neck (series 2, image 41). While this could reflect a benign lipoma, internal heterogeneity is greater than is typically seen. IMPRESSION: 1. Approximate 4.0 x 5.1 x 5.4 cm mass centered at the left palatine tonsil, consistent with primary head and neck malignancy. 2. Abnormal bulky left level 2 adenopathy, consistent with nodal metastases. Additional prominent left level 1b nodes may be involved as well. 3. 5 cm fat density mass at the right posterolateral neck, indeterminate. While this could reflect a benign lipoma, internal heterogeneity is greater than is typically seen. Correlation with physical exam recommended. Further evaluation dedicated nonemergent MRI could be performed for further evaluation as warranted. 4. Prior ACDF at C3 through C5 with advanced spondylosis at C5-6 and C6-7. Emphysema (ICD10-J43.9). Electronically Signed   By: Rise Mu M.D.   On: 04/21/2022 21:22     Time coordinating discharge: Over 30  minutes    Lewie Chamber, MD  [facility] 04/25/2022, 3:24 PM

## 2022-04-25 NOTE — Plan of Care (Signed)

## 2022-04-25 NOTE — Progress Notes (Signed)
Pt discharged home in stable condition. Discharge instructions and bus pass given. Script sent to pharmacy of choice. Pt verbalized understanding with no immediate questions or concerns at this time. Discharged from unit via wheelchair.

## 2022-04-25 NOTE — TOC Progression Note (Signed)
Transition of Care [facility]) - Progression Note    Patient Details  Name: Jonaven Hilgers MRN: [id number] Date of Birth: 1960/01/19  [facility] CM/SW Contact  Beckie Busing, RN Phone Number:[phone]  04/25/2022, 12:18 PM  Clinical Narrative:    [facility] acknowledges consult for PCP. CM has called [facility] there are no appointments available within their time frame of 14 days and this includes all 3 practices. CM referred to call [email or web address] no answer voicemail left. Attempted to call [place] [facility], no answer voicemail left. CM unable to secure appointment and will await for return call. Patient has been updated and AVS updated.         Expected Discharge Plan and Services         Expected Discharge Date: 04/25/22                                     Social Determinants of Health (SDOH) Interventions SDOH Screenings   Alcohol Screen: Medium Risk (04/21/2018)  Depression (PHQ2-9): Low Risk  (03/01/2021)  Tobacco Use: High Risk (04/21/2022)    Readmission Risk Interventions     No data to display

## 2022-04-26 ENCOUNTER — Ambulatory Visit: Payer: Medicare Other | Admitting: Radiation Oncology

## 2022-04-26 ENCOUNTER — Ambulatory Visit: Payer: Medicare Other

## 2022-04-27 ENCOUNTER — Ambulatory Visit
Admission: RE | Admit: 2022-04-27 | Discharge: 2022-04-27 | Disposition: A | Discharge status: Home / Self Care | Payer: Medicare Other | Source: Ambulatory Visit | Attending: Radiation Oncology | Admitting: Radiation Oncology

## 2022-04-27 ENCOUNTER — Other Ambulatory Visit: Payer: Self-pay

## 2022-04-27 VITALS — Ht 72.0 in | Wt 165.0 lb

## 2022-04-27 DIAGNOSIS — J439 Emphysema, unspecified: Secondary | ICD-10-CM | POA: Insufficient documentation

## 2022-04-27 DIAGNOSIS — C099 Malignant neoplasm of tonsil, unspecified: Secondary | ICD-10-CM | POA: Insufficient documentation

## 2022-04-27 DIAGNOSIS — R937 Abnormal findings on diagnostic imaging of other parts of musculoskeletal system: Secondary | ICD-10-CM | POA: Insufficient documentation

## 2022-04-27 DIAGNOSIS — I700 Atherosclerosis of aorta: Secondary | ICD-10-CM | POA: Diagnosis not present

## 2022-04-27 DIAGNOSIS — C090 Malignant neoplasm of tonsillar fossa: Secondary | ICD-10-CM

## 2022-04-27 DIAGNOSIS — R590 Localized enlarged lymph nodes: Secondary | ICD-10-CM | POA: Diagnosis not present

## 2022-04-27 DIAGNOSIS — R5383 Other fatigue: Secondary | ICD-10-CM

## 2022-04-27 DIAGNOSIS — C760 Malignant neoplasm of head, face and neck: Secondary | ICD-10-CM

## 2022-04-27 DIAGNOSIS — R933 Abnormal findings on diagnostic imaging of other parts of digestive tract: Secondary | ICD-10-CM | POA: Insufficient documentation

## 2022-04-27 LAB — GLUCOSE, CAPILLARY: Glucose-Capillary: 119 mg/dL — ABNORMAL HIGH (ref 70–99)

## 2022-04-27 MED ORDER — FLUDEOXYGLUCOSE F - 18 (FDG) INJECTION
8.2000 | Freq: Once | INTRAVENOUS | Status: AC | PRN
  Administered 2022-04-27: 9.45 via INTRAVENOUS

## 2022-04-28 ENCOUNTER — Ambulatory Visit
Admission: RE | Admit: 2022-04-28 | Discharge: 2022-04-28 | Disposition: A | Discharge status: Home / Self Care | Payer: Medicare Other | Source: Ambulatory Visit | Attending: Radiation Oncology | Admitting: Radiation Oncology

## 2022-04-28 VITALS — BP 138/87 | HR 76 | Temp 97.5°F | Resp 18 | Ht 72.0 in | Wt 162.4 lb

## 2022-04-28 DIAGNOSIS — C090 Malignant neoplasm of tonsillar fossa: Secondary | ICD-10-CM | POA: Diagnosis present

## 2022-04-28 DIAGNOSIS — C4492 Squamous cell carcinoma of skin, unspecified: Secondary | ICD-10-CM

## 2022-04-28 DIAGNOSIS — Z510 Encounter for antineoplastic radiation therapy: Secondary | ICD-10-CM | POA: Insufficient documentation

## 2022-04-28 MED ORDER — LORAZEPAM 0.5 MG PO TABS
ORAL_TABLET | ORAL | 0 refills | Status: DC
Start: 2022-04-28 — End: 2022-06-24

## 2022-04-28 MED ORDER — LORAZEPAM 1 MG PO TABS
0.5000 mg | ORAL_TABLET | Freq: Once | ORAL | Status: AC
  Administered 2022-04-28: 0.5 mg via ORAL
  Filled 2022-04-28: qty 0.5

## 2022-04-28 MED ORDER — SODIUM CHLORIDE 0.9% FLUSH
10.0000 mL | Freq: Once | INTRAVENOUS | Status: AC
  Administered 2022-04-28: 10 mL via INTRAVENOUS

## 2022-04-28 MED ORDER — LIDOCAINE VISCOUS HCL 2 % MT SOLN
OROMUCOSAL | 3 refills | Status: DC
Start: 2022-04-28 — End: 2022-06-01

## 2022-04-28 NOTE — Progress Notes (Signed)
I met with the patient and discussed his PET results.   We signed a consent form to proceed with radiation planning today.  There were some findings in his anal canal that I discussed with him.  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  We will refer him back to general surgery for management of this, though it is not as urgent as getting him through his head and neck cancer treatments, first. -----------------------------------  Lonie Peak, MD

## 2022-04-28 NOTE — Progress Notes (Signed)
Has armband been applied?  Yes.    Does patient have an allergy to IV contrast dye?: No.   Has patient ever received premedication for IV contrast dye?: No.   Date of lab work: April 22, 2022 BUN: 11 CR: 0.83 eGFR: >60  Does patient take metformin?: No.  Is eGFR >60?: Yes.   If no, when can patient resume? (Must be 48 hrs AFTER they receive IV contrast):  N/A  IV site: forearm right, condition patent and no redness  Has IV site been added to flowsheet?  Yes.    BP 138/87 (BP Location: Left Arm, Patient Position: Sitting)   Pulse 76   Temp (!) 97.5 F (36.4 C) (Temporal)   Resp 18   Ht 6' (1.829 m)   Wt 162 lb 6 oz (73.7 kg)   SpO2 99%   BMI 22.02 kg/m

## 2022-05-01 ENCOUNTER — Other Ambulatory Visit: Payer: Self-pay

## 2022-05-01 ENCOUNTER — Telehealth: Payer: Self-pay

## 2022-05-01 ENCOUNTER — Other Ambulatory Visit: Payer: Self-pay | Admitting: Hematology and Oncology

## 2022-05-01 DIAGNOSIS — C760 Malignant neoplasm of head, face and neck: Secondary | ICD-10-CM

## 2022-05-01 MED ORDER — HYDROCODONE-ACETAMINOPHEN 5-325 MG PO TABS: 1.0000 | ORAL_TABLET | Freq: Four times a day (QID) | ORAL | 0 refills | Status: DC | PRN

## 2022-05-01 NOTE — Progress Notes (Signed)
Referral placed for general surgery Dr. Abigail Miyamoto

## 2022-05-01 NOTE — Telephone Encounter (Signed)
Kat from Dr. Eliberto Ivory office called RN back to inform her that the pt did not want to schedule an appointment with them at this time. He stated he had too much going on right now, and did not want to schedule an appointment today. He stated to [facility] that he would call them back. She stated he sounded very upset and irritated by the phone call (though RN had told pt just minutes ago they would be calling him). He sounded receptive to this call at that time. She stated he did have someone in the car with him and that may have been an issue too. Rn will continue to monitor and keep everyone updated.

## 2022-05-01 NOTE — Telephone Encounter (Signed)
RN called pt after voicemail was left by pt concerning his throat pain. On return phone call pt stated that he was out of his hydrocodone/acetaminophen (it was originally prescribed by hospitalist on a recent visit). He does report it helped when he had it. He did state he is using his lidocaine solution which is helping him eat and swallow better overall. Rn will reach out to Dr. Basilio Cairo about a refill for this medication.  Next he stated he wanted to make sure his rides were set up for his treatments to start next week. He stated he is eager to start. Rn also reminded him he has appointments this week as well with Dr. Al Pimple.  Finally Rn reached out to referral coordinator with Dr. Abigail Miyamoto to have pt seen for PET scan concerns. They stated they would call him to set up an appointment with him.  RN will continue to follow up on all these concerns and update once more feedback is given.

## 2022-05-01 NOTE — Telephone Encounter (Signed)
RN called pt to let him that his pain medication prescription had been called into the [facility] on [address]. He was grateful for this being placed by Dr. Leonides Schanz. Rn also asked if pt had heard from [place] about transportation and he had not. Rn reached back out to [place] via email to obtain an update on this for pt. He has appointments this week with medical oncology. Rn will keep pt updated on this concern.

## 2022-05-02 ENCOUNTER — Inpatient Hospital Stay
Admission: RE | Admit: 2022-05-02 | Discharge: 2022-05-02 | Disposition: A | Discharge status: Home / Self Care | Payer: Self-pay | Source: Ambulatory Visit | Attending: Radiation Oncology | Admitting: Radiation Oncology

## 2022-05-02 ENCOUNTER — Other Ambulatory Visit: Payer: Self-pay | Admitting: Radiation Oncology

## 2022-05-02 ENCOUNTER — Telehealth: Payer: Self-pay | Admitting: Radiation Oncology

## 2022-05-02 ENCOUNTER — Telehealth: Payer: Self-pay

## 2022-05-02 DIAGNOSIS — C099 Malignant neoplasm of tonsil, unspecified: Secondary | ICD-10-CM

## 2022-05-02 NOTE — Telephone Encounter (Signed)
Notified Patient by voicemail of response from [employer] regarding Hydrocodone. Hydrocodone does not require a prior authorization at this time. Advised Patient to contact his insurance company and have them contact his Pharmacy.

## 2022-05-02 NOTE — Telephone Encounter (Signed)
4/23 @ 11:08 f/u call to [facility]/Dr. Surapaneni's office spoke to [place], only received images for [place] bx of cervical done 03/15/22, need all images done for Head and Neck/Chest.  She will f/u to ensure we received all images on cd or through powershare.

## 2022-05-03 ENCOUNTER — Telehealth: Payer: Self-pay

## 2022-05-03 NOTE — Telephone Encounter (Signed)
Patient called in and states, " I am really depressed. I don't know what to do. It seems like all my thoughts are running together and nothing makes sense." Asked patient if he felt suicidal  patient states, " Yes. Kept patient on the phone and allowed him to speak about his feelings. Patient then states, " I'm not going to hurt myself. " Its just a lot to take in right now offered to have social work reach out to patient about support groups,  patient states, " I think I will be fine."  Instructed patient to call 911 if he felt suicidal. Patient voiced understanding.

## 2022-05-04 ENCOUNTER — Inpatient Hospital Stay: Payer: Medicare Other

## 2022-05-04 ENCOUNTER — Inpatient Hospital Stay (HOSPITAL_BASED_OUTPATIENT_CLINIC_OR_DEPARTMENT_OTHER): Payer: Medicare Other | Admitting: Hematology and Oncology

## 2022-05-04 ENCOUNTER — Telehealth: Payer: Self-pay

## 2022-05-04 ENCOUNTER — Inpatient Hospital Stay: Payer: Medicare Other | Attending: Licensed Clinical Social Worker | Admitting: Licensed Clinical Social Worker

## 2022-05-04 VITALS — BP 134/75 | HR 75 | Temp 98.1°F | Resp 18 | Ht 72.0 in | Wt 159.7 lb

## 2022-05-04 DIAGNOSIS — A630 Anogenital (venereal) warts: Secondary | ICD-10-CM

## 2022-05-04 DIAGNOSIS — C090 Malignant neoplasm of tonsillar fossa: Secondary | ICD-10-CM | POA: Insufficient documentation

## 2022-05-04 DIAGNOSIS — B20 Human immunodeficiency virus [HIV] disease: Secondary | ICD-10-CM | POA: Diagnosis not present

## 2022-05-04 NOTE — Telephone Encounter (Signed)
Rn called pt to let him know that the list of Primary care Providers has been sent to Dr. Remonia Richter nurse Vikki Ports for him to pick up (with his appointment). He was grateful for the call and support.

## 2022-05-04 NOTE — Progress Notes (Addendum)
Oncology Nurse Navigator Documentation   [name] has not shown for his 2:00 appointment with Dr. Al Pimple today. Transportation had been set up for him through the cancer center. I called and left a VM on his phone explaining that we would have to reschedule and provided my direct contact information to call me as soon as possible.   Hedda Slade RN, BSN, OCN Head & Neck Oncology Nurse Navigator [facility] Phone # [phone]  Fax # [phone]

## 2022-05-04 NOTE — Progress Notes (Signed)
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  He was admitted to the hospital and was seen by radiation oncology and one of my colleagues.  He had a PET/CT which did not show any definitive evidence of metastatic disease.  There was some comment of polypoid soft tissue density mass in the left mainstem bronchus noted on a CT chest from an outside hospital however this was not necessarily called as hypermetabolic in the PET/CT. I have had a detailed conversation with Dr. Basilio [name] as well as the patient.  We will reviewed the PET images as well as a CT chest findings in the upcoming tumor board.  I have also talked to Dr. Ilsa [name] from HIV clinic, apparently his HIV medication needs to be adjusted if he needs to consider weekly cisplatin.  Dr. Ilsa [name] will try to accommodate him as quickly as possible to make this change.  We have discussed that at this time given locally advanced squamous cell carcinoma, treatment options that has been suggested was to consider concurrent chemotherapy and radiation.  We have discussed about weekly cisplatin, adverse effects including but not limited to fatigue, nausea, being immunocompromise, risk of severe infections especially with his underlying HIV, nephrotoxicity,  ototoxicity and neuropathy.  He understands that some of the side effects can be life-threatening and permanent.  We have discussed about a port and G-tube.  He is reluctant to do a G-tube he is agreeable for port.  He understands that without proper nutrition, he may not heal well, he is blood counts may not be adequate enough to move forward with chemotherapy every week.  I will have a low threshold to stop chemotherapy if he has too many adverse effects or becomes noncompliant.  Worried about lack of proper social support although he assures me that his baby sister will be here for him.  She currently lives in [place] and he lives with a roommate.  We have discussed that the treatment can be very tough but we are hoping to have him cured with that.  If he does not achieve cure with concurrent chemoradiation which can occasionally happen, he understands that he may have to then proceed with surgery.  All his questions were answered to the best my knowledge.  I will plan to see him back and anticipate starting chemotherapy 5 6 with second week of radiation.  No orders of the defined types were placed in this encounter.    HISTORY OF PRESENTING ILLNESS:  [name] 62 y.o. male is here because of SCC palatine tonsil.  Patient arrived 30 minutes late to his appointment.  This is a patient with T3 N3 HPV mediated p16 positive oropharyngeal cancer with past medical history also significant for HIV who started noticing a new right neck mass and sore throat, had some imaging in early February demonstrating a large left neck mass and a tonsillar  mass.   The scanned report is partially unreadable due to low quality, however findings detailed a a 6.1 cm mass-like area of matted left level 2 and level 3 lymph nodes with areas of necrosis, and a 6.2 cm fatty neoplasm posterior to the right sternocleidomastoid muscle.   The patient was then referred to Dr. Arlester [name] at [facility]  for further management. Dr. Arlester [name] ordered a biopsy of the left neck mass which was performed on 03/15/22 and showed metastatic squamous cell carcinoma; p16 positive.  He was advised to have a PET/CT but was unable to do this because of financial limitations.  He had a PET/CT on April 18 and this showed left tonsillar mass measuring 3.6 x 2.5 x 4.8 cm extending into the epiglottis compatible with malignancy, bulky conglomerate left level 2 adenopathy hypermetabolic and compatible with malignancy fatty mass along the superficial fascia margin of right posterior neck, possibilities include low-grade liposarcoma and atypical lipoma.  There is hypermetabolic activity along an exophytic density in the vicinity of the distal anal canal.]  He appears to have had CT chest which showed polypoid 2.4 x 1.6 x 2.1 cm soft tissue density mass in the left mainstem bronchus concerning for malignancy recommend bronchoscopy.  Small precarinal and right hilar lymph nodes nonspecific  He arrived to the appointment today by himself.  He lives with a roommate, his baby sister lives in [place] who according to him is his social support.  He was very frustrated, answered questions with much detail, tells me that he is frustrated with the system and having to see a lot of people to get started.  He reports some history of anal condylomata in the past for which he required some surgical procedures.  He tells me that he is very compliant with all his medications, his HIV is well-controlled.  He follows up with Dr. Ilsa [name].  He does not want to consider G-tube for feeding, he wants to be able to eat and he is hoping to continue eating throughout the chemoradiation he says. He is able to eat currently, denies any major weight loss.  No recent opportunistic infections.  No history of hearing loss or kidney problems or neuropathy.  Rest of the pertinent 10 point ROS reviewed and negative  MEDICAL HISTORY:  Past Medical History:   Diagnosis Date   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   Hemorrhoids    HIV (human immunodeficiency virus infection) (HCC)    Stenosis of cervical spine    withb myelopathy    SURGICAL HISTORY: Past Surgical History:  Procedure Laterality Date   ANTERIOR CERVICAL DECOMP/DISCECTOMY FUSION N/A 07/06/2017   Procedure: ANTERIOR CERVICAL DECOMPRESSION/DISCECTOMY FUSION C3-4/C4-5 2 LEVELS;  Surgeon: Lisbeth Renshaw, MD;  Location: [facility] OR;  Service: Neurosurgery;  Laterality: N/A;   HERNIA REPAIR  97   lt ing   RECTAL EXAM UNDER ANESTHESIA N/A 04/24/2014   Procedure:  ligation of bleeding vessels;  Surgeon: Almond Lint, MD;  Location: [facility];  Service: General;  Laterality: N/A;   TENDON REPAIR     2006-lt index   WART FULGURATION  07/06/2011   Procedure: FULGURATION ANAL WART;  Surgeon: Shelly Rubenstein, MD;  Location: [facility];  Service: General;  Laterality: N/A;  excision anal condyloma   WART FULGURATION N/A 04/23/2014   Procedure: EXCISION OF ANAL CONDYLOMA;  Surgeon: Abigail Miyamoto, MD;  Location: [facility];  Service: General;  Laterality: N/A;    SOCIAL HISTORY: Social History   Socioeconomic History  Marital status: Single    Spouse name: Not on file   Number of children: Not on file   Years of education: Not on file   Highest education level: Not on file  Occupational History   Occupation: Unemployed  Tobacco Use   Smoking status: Every Day    Packs/day: 0.50    Years: 25.00    Additional pack years: 0.00    Total pack years: 12.50    Types: Cigarettes   Smokeless tobacco: Never  Vaping Use   Vaping Use: Never used  Substance and Sexual Activity   Alcohol use: Yes    Alcohol/week: 24.0 standard drinks of alcohol    Types: 24 Cans of beer per week    Comment: ''I can drink a case a day''   Drug use: Yes    Types: "Crack" cocaine    Comment: pt reports last use was Wednesday   Sexual activity: Not Currently    Comment: pt declined condoms 09/22/19  Other  Topics Concern   Not on file  Social History Narrative   Pt lives alone; receives Tree surgeon.   Social Determinants of Health   Financial Resource Strain: Not on file  Food Insecurity: Not on file  Transportation Needs: Not on file  Physical Activity: Not on file  Stress: Not on file  Social Connections: Not on file  Intimate Partner Violence: Not on file    FAMILY HISTORY: Family History  Problem Relation Age of Onset   Cancer Mother        brain   Cancer Sister        breast    ALLERGIES:  is allergic to shellfish allergy, sulfa antibiotics, fish allergy, and clindamycin.  MEDICATIONS:  Current Outpatient Medications  Medication Sig Dispense Refill   aprepitant (EMEND) 80 & 125 MG capsule Take by mouth.     aspirin EC 81 MG tablet Take 81 mg by mouth daily.     baclofen (LIORESAL) 20 MG tablet Take 20 mg by mouth 3 (three) times daily as needed for muscle spasms.     buPROPion (WELLBUTRIN XL) 300 MG 24 hr tablet Take 300 mg by mouth daily.     cloNIDine (CATAPRES) 0.1 MG tablet Take 0.1 mg by mouth 2 (two) times daily.     Darunavir-Cobicistat-Emtricitabine-Tenofovir Alafenamide (SYMTUZA) 800-150-200-10 MG TABS Take 1 tablet by mouth daily with breakfast. 30 tablet 2   dexamethasone (DECADRON) 6 MG tablet Take 1 tablet (6 mg total) by mouth daily for 14 days. 14 tablet 0   diphenhydrAMINE (BENADRYL) 25 MG tablet Take 25 mg by mouth every 6 (six) hours as needed for sleep.     gabapentin (NEURONTIN) 800 MG tablet Take 800 mg by mouth 3 (three) times daily.     granisetron (KYTRIL) 1 MG tablet Take by mouth.     HYDROcodone-acetaminophen (NORCO/VICODIN) 5-325 MG tablet Take 1 tablet by mouth every 6 (six) hours as needed for severe pain. If pain uncontrolled may take extra dose before next dose due 60 tablet 0   lidocaine (XYLOCAINE) 2 % solution Patient: Mix 1part 2% viscous lidocaine, 1part H20. Swish & swallow 10mL of diluted mixture, before meals and at  bedtime, up to QID 200 mL 3   LORazepam (ATIVAN) 0.5 MG tablet Take one tablet - dissolve under tongue - PRN claustrophobia, 20 min prior to radiation. 10 tablet 0   losartan (COZAAR) 25 MG tablet Take 25 mg by mouth daily.     rosuvastatin (CRESTOR)  5 MG tablet Take 5 mg by mouth daily.     No current facility-administered medications for this visit.     PHYSICAL EXAMINATION: ECOG PERFORMANCE STATUS: 1 - Symptomatic but completely ambulatory  Vitals:   05/04/22 1440  BP: 134/75  Pulse: 75  Resp: 18  Temp: 98.1 F (36.7 C)  SpO2: 100%   Filed Weights   05/04/22 1440  Weight: 159 lb 11.2 oz (72.4 kg)    GENERAL:alert, no distress and comfortable, walks with a limp Neck: Visible left cervical adenopathy.  Large right lipoma.   No visible tonsil mass on oral exam without using a tongue depressor.   LUNGS: clear to auscultation and percussion with normal breathing effort HEART: regular rate & rhythm and no murmurs and no lower extremity edema ABDOMEN:abdomen soft, non-tender and normal bowel sounds Musculoskeletal:no cyanosis of digits and no clubbing  PSYCH: alert & oriented x 3 with fluent speech NEURO: no focal motor/sensory deficits  LABORATORY DATA:  I have reviewed the data as listed Lab Results  Component Value Date   WBC 5.3 04/22/2022   HGB 11.2 (L) 04/22/2022   HCT 33.3 (L) 04/22/2022   MCV 81.8 04/22/2022   PLT 413 (H) 04/22/2022     Chemistry      Component Value Date/Time   NA 130 (L) 04/22/2022 0741   K 4.4 04/22/2022 0741   CL 94 (L) 04/22/2022 0741   CO2 26 04/22/2022 0741   BUN 11 04/22/2022 0741   CREATININE 0.83 04/22/2022 0741   CREATININE 1.07 03/01/2021 1649      Component Value Date/Time   CALCIUM 9.2 04/22/2022 0741   ALKPHOS 44 04/21/2022 1957   AST 31 04/21/2022 1957   ALT 21 04/21/2022 1957   BILITOT 0.6 04/21/2022 1957       RADIOGRAPHIC STUDIES: I have personally reviewed the radiological images as listed and agreed with the  findings in the report. NM PET Image Initial (PI) Skull Base To Thigh  Result Date: 04/28/2022 CLINICAL DATA:  Subsequent treatment strategy for left tonsillar squamous cell carcinoma. Left cervical adenopathy. EXAM: NUCLEAR MEDICINE PET SKULL BASE TO THIGH TECHNIQUE: 9.5 mCi F-18 FDG was injected intravenously. Full-ring PET imaging was performed from the skull base to thigh after the radiotracer. CT data was obtained and used for attenuation correction and anatomic localization. Fasting blood glucose: 119 mg/dl COMPARISON:  CT neck 16/10/9602 FINDINGS: Mediastinal blood pool activity: SUV max 1.8 Liver activity: SUV max NA NECK: Left tonsillar mass extending into the epiglottis has maximum SUV of 13.9 and hypermetabolic activity measuring 3.6 by 2.5 by 4.8 cm, compatible with malignancy. Bulky conglomerate left level IIa adenopathy measuring 3.4 cm in diameter has a maximum SUV of 13.5. Bilateral masseter and right pterygoid and anterior tongue activity is likely benign. 5.0 by 2.7 cm fatty mass along the superficial fascia margin of the right posterior neck primarily splaying the posterior margin of the sternocleidomastoid muscle, with internal stranding and mild internal nodularity as reported previously. Low-grade associated activity, maximum SUV 1.4. Possibilities include low-grade liposarcoma and atypical lipoma. Incidental CT findings: Postoperative findings in the lower cervical spine. CHEST: No significant abnormal hypermetabolic activity in this region. Incidental CT findings: Mild biapical pleuroparenchymal scarring. Centrilobular emphysema. ABDOMEN/PELVIS: Hypermetabolic activity along irregular exophytic density in the vicinity of the distal anal canal and perianal region, maximum SUV 8.6, this region of accentuated activity measures 3.5 by 0.9 by 4.6 cm. Exophytic anal mass such as squamous cell carcinoma not excluded. Incidental CT findings:  Photopenic cyst in the left hepatic lobe.  Atherosclerosis is present, including aortoiliac atherosclerotic disease. SKELETON: No significant abnormal hypermetabolic activity in this region. Incidental CT findings: Mesoacromial os acromiale bilaterally, with degenerative findings on the right side. Severe right degenerative glenohumeral arthropathy with a 2.4 cm free osteochondral fragment in the subscapular recess of the joint and a smaller free fragment in the biceps recess of the joint, with suspected right glenohumeral joint effusion. There is also severe asymmetric degenerative right hip arthropathy. Degenerative endplate sclerosis at L5-S1. Mid cervical plate and screw fixator noted. IMPRESSION: 1. 3.6 by 2.5 by 4.8 cm left tonsillar mass extending into the epiglottis, maximum SUV 13.9, compatible with malignancy. 2. Bulky conglomerate left level IIA adenopathy is hypermetabolic and compatible with malignancy. 3. 5.0 by 2.7 cm fatty mass along the superficial fascia margin of the right posterior neck, with internal stranding and mild internal nodularity. Low-grade associated activity, maximum SUV 1.4. Possibilities include low-grade liposarcoma and atypical lipoma. 4. Hypermetabolic activity along an exophytic density in the vicinity of the distal anal canal and perianal region, maximum SUV 8.6. Exophytic anal mass such as squamous cell carcinoma not excluded. Visual inspection and digital rectal exam recommended for further assessment. 5. Severe right degenerative glenohumeral arthropathy with free osteochondral fragment in the subscapular recess of the joint and a smaller free fragment in the biceps recess of the joint, with suspected right glenohumeral joint effusion. There is also severe asymmetric degenerative right hip arthropathy. Mesoacromial os acromiale bilaterally, with associated degenerative findings on the right side. 6. Aortic atherosclerosis. 7. Emphysema. Aortic Atherosclerosis (ICD10-I70.0) and Emphysema (ICD10-J43.9). Electronically  Signed   By: Gaylyn Rong M.D.   On: 04/28/2022 10:26   DG Chest Port 1 View  Result Date: 04/22/2022 CLINICAL DATA:  Airway compromise EXAM: PORTABLE CHEST 1 VIEW COMPARISON:  05/09/2020 FINDINGS: Normal heart size and mediastinal contours. There is no edema, consolidation, effusion, or pneumothorax. Mild atelectasis at the lung bases. Ossified body in the sub coracoid recess on the right. IMPRESSION: Mild atelectasis at the lung bases. Electronically Signed   By: Tiburcio Pea M.D.   On: 04/22/2022 05:07   CT Soft Tissue Neck W Contrast  Result Date: 04/21/2022 CLINICAL DATA:  Initial evaluation for head neck cancer. EXAM: CT NECK WITH CONTRAST TECHNIQUE: Multidetector CT imaging of the neck was performed using the standard protocol following the bolus administration of intravenous contrast. RADIATION DOSE REDUCTION: This exam was performed according to the departmental dose-optimization program which includes automated exposure control, adjustment of the mA and/or kV according to patient size and/or use of iterative reconstruction technique. CONTRAST:  75mL OMNIPAQUE IOHEXOL 300 MG/ML  SOLN COMPARISON:  None Available. FINDINGS: Pharynx and larynx: Ill-defined mass centered at the left palatine tonsil is seen. Lesion is difficult to measure given the infiltrative and poorly defined nature of this lesion, but measures approximately 4.0 x 5.1 x 5.4 cm in greatest trans axial dimensions. Lesion appears to cross the midline, abutting and involving the uvula. Anterior extension to involve the floor of mouth and base of tongue. Additional superior extension towards the left nasopharynx. Oropharyngeal airway is narrowed and deviated to the right at the level of the lesion. No retropharyngeal collection. Epiglottis within normal limits. Remainder of the hypopharynx and supraglottic larynx within normal limits. Negative glottis. Subglottic airway clear. Salivary glands: Salivary glands including the  parotid and submandibular glands are within normal limits. Thyroid: Normal. Lymph nodes: Extensive abnormal bulky adenopathy seen at left level 2, with largest nodal  conglomerate measuring 3.7 x 3.8 cm (series 2, image 46). Scattered areas of internal hypodensity consistent with necrosis. Mildly prominent left level 1 B nodes measure up to 1 cm, also potentially involved (series 2, image 54). No right-sided adenopathy. Vascular: Normal intravascular enhancement seen throughout the neck. Mild atheromatous change about the carotid bifurcations. Strongly dominant right vertebral artery noted. Left IJ is compressed at the level of the left cervical adenopathy. Limited intracranial: Unremarkable. Visualized orbits: Unremarkable. Mastoids and visualized paranasal sinuses: Visualized paranasal sinuses are clear. Mastoid air cells and middle ear cavities are well pneumatized and free of fluid. Skeleton: No discrete or worrisome osseous lesions. Prior ACDF at C3 through C5. Advanced spondylosis present at C5-6 and C6-7 with prominent anterior endplate osteophytic spurring. Upper chest: Mild emphysematous changes.  No acute findings. Other: Heterogeneous fat density mass measuring up to 5 cm present at the right posterolateral neck (series 2, image 41). While this could reflect a benign lipoma, internal heterogeneity is greater than is typically seen. IMPRESSION: 1. Approximate 4.0 x 5.1 x 5.4 cm mass centered at the left palatine tonsil, consistent with primary head and neck malignancy. 2. Abnormal bulky left level 2 adenopathy, consistent with nodal metastases. Additional prominent left level 1b nodes may be involved as well. 3. 5 cm fat density mass at the right posterolateral neck, indeterminate. While this could reflect a benign lipoma, internal heterogeneity is greater than is typically seen. Correlation with physical exam recommended. Further evaluation dedicated nonemergent MRI could be performed for further  evaluation as warranted. 4. Prior ACDF at C3 through C5 with advanced spondylosis at C5-6 and C6-7. Emphysema (ICD10-J43.9). Electronically Signed   By: Rise Mu M.D.   On: 04/21/2022 21:22    All questions were answered. The patient knows to call the clinic with any problems, questions or concerns. I spent 60 minutes in the care of this patient including H and P, review of records, counseling and coordination of care, all of this about 30 minutes was face-to-face     Rachel Moulds, MD 05/05/2022 2:58 PM

## 2022-05-04 NOTE — Progress Notes (Signed)
[facility] Clinical Social Work  Initial Assessment   [name] is a 62 y.o. year old male presenting alone. Clinical Social Work was referred by nurse for assessment of psychosocial needs.   SDOH (Social Determinants of Health) assessments performed: No   SDOH Screenings   Alcohol Screen: Medium Risk (04/21/2018)  Depression (PHQ2-9): Low Risk  (03/01/2021)  Tobacco Use: High Risk (04/21/2022)    Family/Social Information:  Family members/support persons in your life? Family Transportation concerns: yes, set up with [facility] transportation  Employment: Legally disabled .  Income source: Secretary/administrator concerns:  unsure Religious or spiritual practice: Not known Services Currently in place:  Medicare & medicaid.  [facility] transportation. Disability  Coping/ Adjustment to diagnosis: Patient understands treatment plan and what happens next? Patient is processing what is happening. He has radiation starting next week and is overwhelmed and depressed. Per pt, his support people are just telling him "not to worry" which is not helpful.  He was minimally engaged with CSW today after his visit with Dr. Al Pimple but denied any plan to harm himself.  Concerns about diagnosis and/or treatment: Overwhelmed by information Patient reported stressors: Depression and Adjusting to my illness  Suicide Risk Assessment  Current Mental Status: Suicidal ideation and No plan to harm self or others Loss Factors: Decline in physical health CLINICAL FACTORS:  Depression:   Severe  SUICIDE RISK:  Low. Suicidal ideation, thoughts of being better of dead related to current health condition (cancer). There are no identifiable plans and no associated intent.  PLAN OF CARE:  Crisis resources given. Pt agreed to call resources as needed    SUMMARY: Current SDOH Barriers:  Mental Health Concerns   Clinical Social Work Clinical Goal(s):  Patient will work with SW to address concerns related to  mental health and adjustment to cancer  Interventions: Discussed common feeling and emotions when being diagnosed with cancer, and the importance of support during treatment Informed patient of the support team roles and support services at [facility] Provided CSW contact information and encouraged patient to call with any questions or concerns Provided patient with information about suicide crisis resources Encouraged pt to call as needed between visits for emotional support   Follow Up Plan: Patient will call crisis numbers if having thoughts of hurting himself. CSW will follow-up with pt when he is on site for other appointments Patient verbalizes understanding of plan: Yes    [name] Peyton E Yamilee Harmes, LCSW   Suicide Resources  Who to Call Call 911 National Suicide Prevention Hotline - Dial 988 Crisis Text Line:  text SAVE to 0011001100 [facility]:  [phone] or [phone] [facility]: [phone] or walk-in to [address], [place], [place] [address] [facility]: [phone]

## 2022-05-04 NOTE — Progress Notes (Signed)
Oncology Nurse Navigator Documentation   Met with patient during initial consult with Dr. Al Pimple.  Further introduced myself as his Navigator, explained my role as a member of the Care Team. Assisted with post-consult appt scheduling.  He verbalized understanding of information provided. I encouraged them to call with questions/concerns moving forward.He knows that I will meet him on Monday when he starts radiation.   Hedda Slade, RN, BSN, OCN Head & Neck Oncology Nurse Navigator [facility] [phone]

## 2022-05-05 ENCOUNTER — Other Ambulatory Visit: Payer: Self-pay

## 2022-05-05 ENCOUNTER — Other Ambulatory Visit: Payer: Self-pay | Admitting: Pharmacist

## 2022-05-05 ENCOUNTER — Encounter: Payer: Self-pay | Admitting: Hematology and Oncology

## 2022-05-05 DIAGNOSIS — C090 Malignant neoplasm of tonsillar fossa: Secondary | ICD-10-CM

## 2022-05-05 DIAGNOSIS — Z510 Encounter for antineoplastic radiation therapy: Secondary | ICD-10-CM | POA: Diagnosis not present

## 2022-05-05 NOTE — Assessment & Plan Note (Signed)
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  He was admitted to the hospital and was seen by radiation oncology and one of my colleagues.  He had a PET/CT which did not show any definitive evidence of metastatic disease.  There was some comment of polypoid soft tissue density mass in the left mainstem bronchus noted on a CT chest from an outside hospital however this was not necessarily called as hypermetabolic in the PET/CT. I have had a detailed conversation with Dr. Basilio Cairo as well as the patient.  We will reviewed the PET images as well as a CT chest findings in the upcoming tumor board.  I have also talked to Dr. Ilsa Iha from HIV clinic, apparently his HIV medication needs to be adjusted if he needs to consider weekly cisplatin.  Dr. Ilsa Iha will try to accommodate him as quickly as possible to make this change.  We have discussed that at this time given locally advanced squamous cell carcinoma, treatment options that has been suggested was to consider concurrent chemotherapy and radiation.  We have discussed about weekly cisplatin, adverse effects including but not limited to fatigue, nausea, being immunocompromise, risk of severe infections especially with his underlying HIV, nephrotoxicity, ototoxicity and neuropathy.  He understands that some of the side effects can be life-threatening and permanent.  We have discussed about a port and G-tube.  He is reluctant to do a G-tube he is agreeable for port.  He understands that without proper nutrition, he may not heal well, he is blood counts may not be adequate enough to move forward with  chemotherapy every week.  I will have a low threshold to stop chemotherapy if he has too many adverse effects or becomes noncompliant.  Worried about lack of proper social support although he assures me that his baby sister will be here for him.  She currently lives in [place] and he lives with a roommate.  We have discussed that the treatment can be very tough but we are hoping to have him cured with that.  If he does not achieve cure with concurrent chemoradiation which can occasionally happen, he understands that he may have to then proceed with surgery.  All his questions were answered to the best my knowledge.  I will plan to see him back and anticipate starting chemotherapy 5 6 with second week of radiation.

## 2022-05-05 NOTE — Progress Notes (Signed)
START ON PATHWAY REGIMEN - Head and Neck     A cycle is every 7 days:     Cisplatin   **Always confirm dose/schedule in your pharmacy ordering system**  Patient Characteristics: Oropharynx, HPV Positive, Preoperative or Nonsurgical Candidate (Clinical Staging), cT0-4, cN1-3 or cT3-4, cN0 Disease Classification: Oropharynx HPV Status: Positive (+) Therapeutic Status: Preoperative or Nonsurgical Candidate (Clinical Staging) AJCC T Category: cT3 AJCC 8 Stage Grouping: III AJCC N Category: cN3 AJCC M Category: cM0 Intent of Therapy: Curative Intent, Discussed with Patient 

## 2022-05-06 ENCOUNTER — Other Ambulatory Visit: Payer: Self-pay

## 2022-05-07 ENCOUNTER — Encounter: Payer: Self-pay | Admitting: Hematology and Oncology

## 2022-05-08 ENCOUNTER — Other Ambulatory Visit: Payer: Self-pay

## 2022-05-08 ENCOUNTER — Ambulatory Visit: Payer: Medicare Other

## 2022-05-08 ENCOUNTER — Telehealth: Payer: Self-pay | Admitting: Radiation Oncology

## 2022-05-08 ENCOUNTER — Ambulatory Visit
Admission: RE | Admit: 2022-05-08 | Discharge: 2022-05-08 | Disposition: A | Discharge status: Home / Self Care | Payer: Medicare Other | Source: Ambulatory Visit | Attending: Radiation Oncology | Admitting: Radiation Oncology

## 2022-05-08 DIAGNOSIS — C090 Malignant neoplasm of tonsillar fossa: Secondary | ICD-10-CM

## 2022-05-08 DIAGNOSIS — Z510 Encounter for antineoplastic radiation therapy: Secondary | ICD-10-CM | POA: Diagnosis not present

## 2022-05-08 LAB — RAD ONC ARIA SESSION SUMMARY
Course Elapsed Days: 0
Plan Fractions Treated to Date: 1
Plan Prescribed Dose Per Fraction: 2 Gy
Plan Total Fractions Prescribed: 35
Plan Total Prescribed Dose: 70 Gy
Reference Point Dosage Given to Date: 2 Gy
Reference Point Session Dosage Given: 2 Gy
Session Number: 1

## 2022-05-08 MED ORDER — RADIAPLEXRX EX GEL: Freq: Once | CUTANEOUS | Status: AC

## 2022-05-08 NOTE — Telephone Encounter (Signed)
4/29 @ 3:58 pm patient called to let someone know that his original transportation never show up, so he was able to get another ride to his xrt appt and will be here shortly.  Called L3 machine spoke to [place] so they are aware.

## 2022-05-08 NOTE — Progress Notes (Addendum)
Pt here for patient teaching.  Pt given Radiation and You booklet, Managing Acute Radiation Side Effects for Head and Neck Cancer handout, skin care instructions, and Radiaplex gel.  Reviewed areas of pertinence such as fatigue, hair loss, mouth changes, nausea and vomiting, skin changes, throat changes, headache, cough, shortness of breath, earaches, and taste changes . Pt able to give teach back of to pat skin, use unscented/gentle soap, and drink plenty of water,apply Radiaplex bid, avoid applying anything to skin within 4 hours of treatment, and to use an electric razor if they must shave. Pt verbalizes understanding of information given and will contact nursing with any questions or concerns.     Patient allergic to Sonafine. Radiaplex given.   [email or web address]

## 2022-05-09 ENCOUNTER — Other Ambulatory Visit: Payer: Self-pay

## 2022-05-09 ENCOUNTER — Encounter: Payer: Self-pay | Admitting: Dietician

## 2022-05-09 ENCOUNTER — Telehealth: Payer: Self-pay | Admitting: *Deleted

## 2022-05-09 ENCOUNTER — Other Ambulatory Visit: Payer: Self-pay | Admitting: Hematology and Oncology

## 2022-05-09 ENCOUNTER — Ambulatory Visit: Payer: Medicare Other

## 2022-05-09 ENCOUNTER — Ambulatory Visit
Admission: RE | Admit: 2022-05-09 | Discharge: 2022-05-09 | Disposition: A | Discharge status: Home / Self Care | Payer: Medicare Other | Source: Ambulatory Visit | Attending: Radiation Oncology | Admitting: Radiation Oncology

## 2022-05-09 DIAGNOSIS — C090 Malignant neoplasm of tonsillar fossa: Secondary | ICD-10-CM

## 2022-05-09 DIAGNOSIS — Z510 Encounter for antineoplastic radiation therapy: Secondary | ICD-10-CM | POA: Diagnosis not present

## 2022-05-09 LAB — RAD ONC ARIA SESSION SUMMARY
Course Elapsed Days: 1
Plan Fractions Treated to Date: 2
Plan Prescribed Dose Per Fraction: 2 Gy
Plan Total Fractions Prescribed: 35
Plan Total Prescribed Dose: 70 Gy
Reference Point Dosage Given to Date: 4 Gy
Reference Point Session Dosage Given: 2 Gy
Session Number: 2

## 2022-05-09 MED ORDER — LORAZEPAM 1 MG PO TABS
0.5000 mg | ORAL_TABLET | Freq: Once | ORAL | Status: AC
  Administered 2022-05-09: 0.5 mg via ORAL
  Filled 2022-05-09: qty 0.5

## 2022-05-09 MED ORDER — HYDROCODONE-ACETAMINOPHEN 5-325 MG PO TABS: 2.0000 | ORAL_TABLET | Freq: Four times a day (QID) | ORAL | 0 refills | Status: DC | PRN

## 2022-05-09 MED ORDER — LORAZEPAM 1 MG PO TABS
0.5000 mg | ORAL_TABLET | Freq: Once | ORAL | Status: AC
  Filled 2022-05-09: qty 0.5

## 2022-05-09 NOTE — Telephone Encounter (Signed)
This RN received request for hydrocodone refill -called and discussed with pt.  Noted last dispensed 60 tablets on 05/01/2022 of hydrocodone 5mg /APAP 325mg  with dosing of 1 tablet every 6 hours and may increase if needed.  Pt is now taking 2 tablets every 6 hours approximately with good control and able to swallow without difficulty.  Pt also stated he needs a refill on the decadron - noted 6 mg tablets dispensed on 4/16 by hospitalist for 14 tabs - take 1 tablet daily.  [name] stated above was given for swelling.  This RN discussed above - including goal of therapy is cure- and validation of medications needed for support. With invitation for pt to call for any concerns or symptoms so we can help him.  Pt verbalized understanding.

## 2022-05-09 NOTE — Progress Notes (Signed)
One bag of food from [facility] pantry and Ensure samples left at registration. Patient to pick up on 4/30 after treatment. Patient is scheduled to see nutrition Friday 5/3.

## 2022-05-10 ENCOUNTER — Ambulatory Visit
Admission: RE | Admit: 2022-05-10 | Discharge: 2022-05-10 | Disposition: A | Discharge status: Home / Self Care | Payer: Medicare Other | Source: Ambulatory Visit | Attending: Radiation Oncology | Admitting: Radiation Oncology

## 2022-05-10 ENCOUNTER — Inpatient Hospital Stay: Payer: Medicare Other | Attending: Licensed Clinical Social Worker

## 2022-05-10 ENCOUNTER — Ambulatory Visit: Payer: Medicare Other

## 2022-05-10 ENCOUNTER — Other Ambulatory Visit: Payer: Self-pay

## 2022-05-10 DIAGNOSIS — I700 Atherosclerosis of aorta: Secondary | ICD-10-CM | POA: Insufficient documentation

## 2022-05-10 DIAGNOSIS — C090 Malignant neoplasm of tonsillar fossa: Secondary | ICD-10-CM | POA: Insufficient documentation

## 2022-05-10 DIAGNOSIS — K30 Functional dyspepsia: Secondary | ICD-10-CM | POA: Diagnosis not present

## 2022-05-10 DIAGNOSIS — Z510 Encounter for antineoplastic radiation therapy: Secondary | ICD-10-CM | POA: Insufficient documentation

## 2022-05-10 DIAGNOSIS — Z5111 Encounter for antineoplastic chemotherapy: Secondary | ICD-10-CM | POA: Insufficient documentation

## 2022-05-10 DIAGNOSIS — Z79899 Other long term (current) drug therapy: Secondary | ICD-10-CM | POA: Diagnosis not present

## 2022-05-10 DIAGNOSIS — G893 Neoplasm related pain (acute) (chronic): Secondary | ICD-10-CM | POA: Insufficient documentation

## 2022-05-10 DIAGNOSIS — E860 Dehydration: Secondary | ICD-10-CM | POA: Insufficient documentation

## 2022-05-10 DIAGNOSIS — E43 Unspecified severe protein-calorie malnutrition: Secondary | ICD-10-CM | POA: Diagnosis not present

## 2022-05-10 DIAGNOSIS — J439 Emphysema, unspecified: Secondary | ICD-10-CM | POA: Insufficient documentation

## 2022-05-10 DIAGNOSIS — E871 Hypo-osmolality and hyponatremia: Secondary | ICD-10-CM | POA: Diagnosis not present

## 2022-05-10 LAB — RAD ONC ARIA SESSION SUMMARY
Course Elapsed Days: 2
Plan Fractions Treated to Date: 3
Plan Prescribed Dose Per Fraction: 2 Gy
Plan Total Fractions Prescribed: 35
Plan Total Prescribed Dose: 70 Gy
Reference Point Dosage Given to Date: 6 Gy
Reference Point Session Dosage Given: 2 Gy
Session Number: 3

## 2022-05-11 ENCOUNTER — Inpatient Hospital Stay: Payer: Medicare Other

## 2022-05-11 ENCOUNTER — Other Ambulatory Visit: Payer: Self-pay

## 2022-05-11 ENCOUNTER — Ambulatory Visit: Payer: Medicare Other

## 2022-05-11 ENCOUNTER — Telehealth: Payer: Self-pay

## 2022-05-11 ENCOUNTER — Ambulatory Visit (INDEPENDENT_AMBULATORY_CARE_PROVIDER_SITE_OTHER): Payer: Medicare Other | Admitting: Internal Medicine

## 2022-05-11 ENCOUNTER — Encounter: Payer: Self-pay | Admitting: Internal Medicine

## 2022-05-11 ENCOUNTER — Ambulatory Visit
Admission: RE | Admit: 2022-05-11 | Discharge: 2022-05-11 | Disposition: A | Discharge status: Home / Self Care | Payer: Medicare Other | Source: Ambulatory Visit | Attending: Radiation Oncology | Admitting: Radiation Oncology

## 2022-05-11 VITALS — BP 130/78 | HR 73 | Resp 16 | Ht 72.0 in | Wt 154.0 lb

## 2022-05-11 DIAGNOSIS — Z510 Encounter for antineoplastic radiation therapy: Secondary | ICD-10-CM | POA: Diagnosis not present

## 2022-05-11 DIAGNOSIS — B20 Human immunodeficiency virus [HIV] disease: Secondary | ICD-10-CM | POA: Diagnosis present

## 2022-05-11 LAB — RAD ONC ARIA SESSION SUMMARY
Course Elapsed Days: 3
Plan Fractions Treated to Date: 4
Plan Prescribed Dose Per Fraction: 2 Gy
Plan Total Fractions Prescribed: 35
Plan Total Prescribed Dose: 70 Gy
Reference Point Dosage Given to Date: 8 Gy
Reference Point Session Dosage Given: 2 Gy
Session Number: 4

## 2022-05-11 MED ORDER — SYMTUZA 800-150-200-10 MG PO TABS
1.0000 | ORAL_TABLET | Freq: Every day | ORAL | 11 refills | Status: AC
Start: 2022-05-11 — End: ?

## 2022-05-11 NOTE — Progress Notes (Signed)
Oncology Nurse Navigator Documentation   I met [name] after his radiation today. He was late to radiation and treated later because of that so he was unable to keep his appointment with Speech Therapy today at 1:45. I provided Mr. Jutte with a calendar of his upcoming appointments. I explained to him that his first chemotherapy is scheduled for Monday 5/6 starting at 8:15. I told him that I would notify our transportation coordinator of the need to arrive earlier that day. I also reminded him that our IR scheduler has been trying to contact him to schedule his PAC. I also provided him with her direct number. I explained that getting the PAC was very important and he may not be able to receive all his treatment as recommended if he does not have it inserted. I once again explained to him that he would need a responsible driver to take him home that our transportation services do not qualify. Mr. Courington has my contact number and I encouraged him to call me with questions or concerns.  Hedda Slade RN, BSN, OCN Head & Neck Oncology Nurse Navigator [facility] Phone # [phone]  Fax # [phone]

## 2022-05-11 NOTE — Therapy (Deleted)
OUTPATIENT SPEECH LANGUAGE PATHOLOGY ONCOLOGY EVALUATION   Patient Name: [name] MRN: [id number] DOB:Nov 24, 1960, 62 y.o., male Today's Date: 05/11/2022  PCP: *** REFERRING PROVIDER: ***  END OF SESSION:   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   Hemorrhoids    HIV (human immunodeficiency virus infection) (HCC)    Stenosis of cervical spine    withb myelopathy   Past Surgical History:  Procedure Laterality Date   ANTERIOR CERVICAL DECOMP/DISCECTOMY FUSION N/A 07/06/2017   Procedure: ANTERIOR CERVICAL DECOMPRESSION/DISCECTOMY FUSION C3-4/C4-5 2 LEVELS;  Surgeon: Lisbeth Renshaw, MD;  Location: [facility] OR;  Service: Neurosurgery;  Laterality: N/A;   HERNIA REPAIR  97   lt ing   RECTAL EXAM UNDER ANESTHESIA N/A 04/24/2014   Procedure:  ligation of bleeding vessels;  Surgeon: Almond Lint, MD;  Location: [facility] ORS;  Service: General;  Laterality: N/A;   TENDON REPAIR     2006-lt index   WART FULGURATION  07/06/2011   Procedure: FULGURATION ANAL WART;  Surgeon: Shelly Rubenstein, MD;  Location: [facility];  Service: General;  Laterality: N/A;  excision anal condyloma   WART FULGURATION N/A 04/23/2014   Procedure: EXCISION OF ANAL CONDYLOMA;  Surgeon: Abigail Miyamoto, MD;  Location: [facility];  Service: General;  Laterality: N/A;   Patient Active Problem List   Diagnosis Date Noted   Cancer of tonsillar fossa (HCC) 04/25/2022   SCC (squamous cell carcinoma) 04/24/2022   Normocytic anemia 04/24/2022   Chronic back pain 02/24/2020   Ileus (HCC) 03/24/2019   MDD (major depressive disorder), recurrent episode (HCC) 04/20/2018   Chronic right-sided low back pain 04/16/2018   Weakness of right lower extremity 04/16/2018   Healthcare maintenance 02/04/2018   Blood loss anemia 01/28/2018   Rectal bleeding 01/28/2018   Alcohol dependence with alcohol-induced mood disorder (HCC)    Major depressive disorder, recurrent severe without psychotic  features (HCC) 08/01/2017   Dysphagia 07/08/2017   Stenosis of cervical spine with myelopathy (HCC) 07/06/2017   Left-sided weakness 06/27/2017   Tricompartment osteoarthritis of right knee 03/27/2017   Alcohol abuse with alcohol-induced mood disorder (HCC) 09/25/2016   Polysubstance dependence (HCC) 09/25/2016   Substance use disorder 09/19/2016   MDD (major depressive disorder), recurrent severe, without psychosis (HCC) 09/18/2016   Cocaine abuse (HCC) 07/27/2015   Suicidal ideation 07/27/2015   Cocaine dependence with intoxication with complication (HCC) 07/23/2015   Feeling like committing suicide 07/23/2015   Metabolic acidosis 07/23/2015   Tobacco abuse 07/23/2015   Substance induced mood disorder (HCC) 07/19/2015   Bipolar 1 disorder, depressed (HCC) 07/18/2015   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   HIV disease (HCC) 11/09/2008    ONSET DATE: ***   REFERRING DIAG: ***  THERAPY DIAG:  No diagnosis found.  Rationale for Evaluation and Treatment: {HABREHAB:27488}  SUBJECTIVE:   SUBJECTIVE STATEMENT: *** Pt accompanied by: {accompnied:27141}  PERTINENT HISTORY:    PAIN:  Are you having pain? {OPRCPAIN:27236}  FALLS: Has patient fallen in last 6 months?  {WUJWJXBJ:47829}  LIVING ENVIRONMENT: Lives with: {OPRC lives with:25569::"lives with their family"} Lives in: {Lives in:25570}  PLOF:  Level of assistance: {FAOZHYQ:65784} Employment: {SLPemployment:25674}  PATIENT GOALS: ***  OBJECTIVE:   DIAGNOSTIC FINDINGS: ***  RECOMMENDATIONS FROM OBJECTIVE SWALLOW STUDY (MBSS/FEES):  *** Objective swallow impairments: *** Objective recommended compensations: ***  COGNITION: Overall cognitive status: {cognition:24006} Areas of impairment:  {cognitiveimpairmentslp:27409} Functional deficits: ***  LANGUAGE: Receptive and Expressive language appeared WNL.***  ORAL MOTOR EXAMINATION: Overall status: {OMESLP2:27645} Comments: ***  MOTOR SPEECH: Overall motor  speech: {slpimpaired:27210} Level of impairment: {SLP level of impairment:25441} Respiration: {respbreathing:27195} Phonation: {SLP phonation:25439} Resonance: {SLP resonance:25440} Articulation: {SLParticulation:27218} Intelligibility: {SLP Intelligible:25442} Motor planning: {slpmotorspeecherrors:27220} Motor speech errors: {SLP motor speech errors:25443} Interfering components: {SLP Interfering components (MS):25444} Effective technique: {SLP effective technique (MS):25445}  CLINICAL SWALLOW ASSESSMENT:   Current diet: {slpdiet:27196} Objective swallow impairments: *** Objective recommended compensations: *** Dentition: {dentition:27197} Patient directly observed with POs: {POobserved:27199} Feeding: {slp feeding:27200} Liquids provided by: {SLPliquids:27201} Oral phase signs and symptoms: {SLPoralphase:27202} Pharyngeal phase signs and symptoms: {SLPpharyngealphase:27203}   PATIENT REPORTED OUTCOME MEASURES (PROM): {SLPPROM:27095}  TODAY'S TREATMENT:                                                                                                                                         DATE:   04/13/22 (eval): Research states the risk for dysphagia increases due to radiation and/or chemotherapy treatment due to a variety of factors, so SLP educated the pt about the possibility of reduced/limited ability for PO intake during rad tx. SLP also educated pt regarding possible changes to swallowing musculature after rad tx, and why adherence to dysphagia HEP provided today and PO consumption was necessary to inhibit muscle fibrosis following rad tx and to mitigate muscle disuse atrophy. SLP informed pt why this would be detrimental to their swallowing status and to their pulmonary health. Pt demonstrated understanding of these things to SLP. SLP encouraged pt to safely eat and drink as deep into their radiation/chemotherapy as possible to provide the best possible long-term swallowing outcome  for pt.    SLP then developed an individualized HEP for pt involving oral and pharyngeal strengthening and ROM and pt was instructed how to perform these exercises, including SLP demonstration. After SLP demonstration, pt return demonstrated each exercise. SLP ensured pt performance was correct prior to educating pt on next exercise. Pt required usual min-mod cues faded to modified independent to perform HEP. Pt was instructed to complete this program 6-7 days/week, at least 2 times a day (thinking to perform at least 20 reps of SWALLOWING exercises -all but [place]) until 6 months after his last day of rad tx, and then x2 a week after that, indefinitely. Among other modifications for days when pt cannot functionally swallow, SLP also suggested pt to perform only non-swallowing tasks on the handout/HEP, and if necessary to cycle through the swallowing portion so the full program of exercises can be completed instead of fatiguing on one of the swallowing exercises and being unable to perform the other swallowing exercises. SLP instructed that swallowing exercises should then be added back into the regimen as pt is able to do so.    PATIENT EDUCATION: Education details: late effects head/neck radiation on swallow function, HEP procedure, and modification to HEP when difficulty experienced with swallowing during and after radiation course Person educated: Patient and spouse Education method: Explanation, Demonstration, Verbal cues, and  Handouts Education comprehension: verbalized understanding, returned demonstration, verbal cues required, and needs further education     ASSESSMENT:   CLINICAL IMPRESSION: Patient is a 62y.o. male who was seen today for treatment of swallowing as they undergo chemoradiation therapy. Today pt ate XXXXXXXXXX without overt s/s oral or pharyngeal difficulty.  At this time pt swallowing is deemed WNL/WFL with these POs. There are no overt s/s aspiration PNA observed by SLP nor  any reported by pt at this time. Data indicate that pt's swallow ability will likely decrease over the course of radiation/chemoradiation therapy and could very well decline over time following the conclusion of that therapy due to muscle disuse atrophy and/or muscle fibrosis. Pt will cont to need to be seen by SLP in order to assess safety of PO intake, assess the need for recommending any objective swallow assessment, and ensuring pt is correctly completing the individualized HEP.   OBJECTIVE IMPAIRMENTS: include dysphagia. These impairments are limiting patient from safety when swallowing. Factors affecting potential to achieve goals and functional outcome are  none noted today . Patient will benefit from skilled SLP services to address above impairments and improve overall function.   REHAB POTENTIAL: Good     GOALS: Goals reviewed with patient? Yes   SHORT TERM GOALS: Target: 3rd total session   Pt will compelte HEP with modified independence in 2 sessions Baseline: Goal status: Initial   2.  pt will tell SLP why pt is completing HEP with modified independence Baseline:  Goal status: Initial     3.  pt will describe 3 overt s/s aspiration PNA with modified independence Baseline:  Goal status: Initial   4.  pt will tell SLP how a food journal could hasten return to a more normalized diet Baseline:  Goal status: Initial     LONG TERM GOALS: Target: 7th total session   1.  pt will complete HEP with independence over two visits Baseline:  Goal status: Initial   2.  pt will describe how to modify HEP over time, and the timeline associated with reduction in HEP frequency with modified independence over two sessions Baseline:  Goal status: Initial   PLAN:   SLP FREQUENCY:  once approx every 4 weeks   SLP DURATION:  6 sessions (7 total sessions)   PLANNED INTERVENTIONS: Aspiration precaution training, Pharyngeal strengthening exercises, Diet toleration management ,  Environmental controls, Trials of upgraded texture/liquids, Cueing hierachy, Internal/external aids, SLP instruction and feedback, Compensatory strategies, and Patient/family education    [facility], CCC-SLP 05/11/2022, 9:06 AM

## 2022-05-11 NOTE — Telephone Encounter (Signed)
Attempted to contact patient to reschedule after No Show on 5/224 with Dr.Snider. Unable to leave a message.

## 2022-05-11 NOTE — Progress Notes (Signed)
RFV: follow up on hiv disease Patient ID: [name], male   DOB: 10-31-1960, 62 y.o.   MRN: [id number]  HPI 62 Yo M with hiv disease on symtuza. Recently now in care for cancer center for X. Plan to start on cisplatin on 05/14/22.ran out yesterday of his hiv meds  Outpatient Encounter Medications as of 05/11/2022  Medication Sig   amoxicillin-clavulanate (AUGMENTIN) 875-125 MG tablet Take 1 tablet by mouth 2 (two) times daily.   aprepitant (EMEND) 80 & 125 MG capsule Take by mouth.   aspirin EC 81 MG tablet Take 81 mg by mouth daily.   baclofen (LIORESAL) 20 MG tablet Take 20 mg by mouth 3 (three) times daily as needed for muscle spasms.   buPROPion (WELLBUTRIN XL) 300 MG 24 hr tablet Take 300 mg by mouth daily.   cloNIDine (CATAPRES) 0.1 MG tablet Take 0.1 mg by mouth 2 (two) times daily.   Darunavir-Cobicistat-Emtricitabine-Tenofovir Alafenamide (SYMTUZA) 800-150-200-10 MG TABS Take 1 tablet by mouth daily with breakfast.   diphenhydrAMINE (BENADRYL) 25 MG tablet Take 25 mg by mouth every 6 (six) hours as needed for sleep.   gabapentin (NEURONTIN) 800 MG tablet Take 800 mg by mouth 3 (three) times daily.   granisetron (KYTRIL) 1 MG tablet Take by mouth.   HYDROcodone-acetaminophen (NORCO/VICODIN) 5-325 MG tablet Take 2 tablets by mouth every 6 (six) hours as needed for severe pain. If pain uncontrolled may take extra dose before next dose due   lidocaine (XYLOCAINE) 2 % solution Patient: Mix 1part 2% viscous lidocaine, 1part H20. Swish & swallow 10mL of diluted mixture, before meals and at bedtime, up to QID   LORazepam (ATIVAN) 0.5 MG tablet Take one tablet - dissolve under tongue - PRN claustrophobia, 20 min prior to radiation.   losartan (COZAAR) 25 MG tablet Take 25 mg by mouth daily.   rosuvastatin (CRESTOR) 5 MG tablet Take 5 mg by mouth daily.   Facility-Administered Encounter Medications as of 05/11/2022  Medication   LORazepam (ATIVAN) tablet 0.5 mg     Patient  Active Problem List   Diagnosis Date Noted   Cancer of tonsillar fossa (HCC) 04/25/2022   SCC (squamous cell carcinoma) 04/24/2022   Normocytic anemia 04/24/2022   Chronic back pain 02/24/2020   Ileus (HCC) 03/24/2019   MDD (major depressive disorder), recurrent episode (HCC) 04/20/2018   Chronic right-sided low back pain 04/16/2018   Weakness of right lower extremity 04/16/2018   Healthcare maintenance 02/04/2018   Blood loss anemia 01/28/2018   Rectal bleeding 01/28/2018   Alcohol dependence with alcohol-induced mood disorder (HCC)    Major depressive disorder, recurrent severe without psychotic features (HCC) 08/01/2017   Dysphagia 07/08/2017   Stenosis of cervical spine with myelopathy (HCC) 07/06/2017   Left-sided weakness 06/27/2017   Tricompartment osteoarthritis of right knee 03/27/2017   Alcohol abuse with alcohol-induced mood disorder (HCC) 09/25/2016   Polysubstance dependence (HCC) 09/25/2016   Substance use disorder 09/19/2016   MDD (major depressive disorder), recurrent severe, without psychosis (HCC) 09/18/2016   Cocaine abuse (HCC) 07/27/2015   Suicidal ideation 07/27/2015   Cocaine dependence with intoxication with complication (HCC) 07/23/2015   Feeling like committing suicide 07/23/2015   Metabolic acidosis 07/23/2015   Tobacco abuse 07/23/2015   Substance induced mood disorder (HCC) 07/19/2015   Bipolar 1 disorder, depressed (HCC) 07/18/2015   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   HIV disease (HCC) 11/09/2008     Health Maintenance Due  Topic Date Due   Medicare Annual Wellness (AWV)  Never done   COVID-19 Vaccine (1) Never done   Hepatitis C Screening  Never done   DTaP/Tdap/Td (1 - Tdap) Never done   Zoster Vaccines- Shingrix (1 of 2) Never done   COLONOSCOPY (Pts 45-65yrs Insurance coverage will need to be confirmed)  Never done     Review of Systems Review of Systems  Constitutional: Negative for fever, chills, diaphoresis, activity change, appetite  change, fatigue and unexpected weight change.  HENT: Negative for congestion, sore throat, rhinorrhea, sneezing, trouble swallowing and sinus pressure.  Eyes: Negative for photophobia and visual disturbance.  Respiratory: Negative for cough, chest tightness, shortness of breath, wheezing and stridor.  Cardiovascular: Negative for chest pain, palpitations and leg swelling.  Gastrointestinal: Negative for nausea, vomiting, abdominal pain, diarrhea, constipation, blood in stool, abdominal distention and anal bleeding.  Genitourinary: Negative for dysuria, hematuria, flank pain and difficulty urinating.  Musculoskeletal: Negative for myalgias, back pain, joint swelling, arthralgias and gait problem.  Skin: Negative for color change, pallor, rash and wound.  Neurological: Negative for dizziness, tremors, weakness and light-headedness.  Hematological: Negative for adenopathy. Does not bruise/bleed easily.  Psychiatric/Behavioral: Negative for behavioral problems, confusion, sleep disturbance, dysphoric mood, decreased concentration and agitation.   Physical Exam   Ht 6' (1.829 m)   Wt 154 lb (69.9 kg)   BMI 20.89 kg/m   Physical Exam  Constitutional: He is oriented to person, place, and time. He appears well-developed and well-nourished. No distress.  HENT:  Mouth/Throat: Oropharynx is clear and moist. No oropharyngeal exudate.  Cardiovascular: Normal rate, regular rhythm and normal heart sounds. Exam reveals no gallop and no friction rub.  No murmur heard.  Pulmonary/Chest: Effort normal and breath sounds normal. No respiratory distress. He has no wheezes.  Abdominal: Soft. Bowel sounds are normal. He exhibits no distension. There is no tenderness.  Lymphadenopathy:  He has no cervical adenopathy.  Neurological: He is alert and oriented to person, place, and time.  Skin: Skin is warm and dry. No rash noted. No erythema.  Psychiatric: He has a normal mood and affect. His behavior is normal.    Lab Results  Component Value Date   CD4TCELL 20 (L) 04/24/2022   Lab Results  Component Value Date   CD4TABS 225 (L) 04/24/2022   CD4TABS 515 09/22/2019   CD4TABS 433 06/18/2019   Lab Results  Component Value Date   HIV1RNAQUANT 30 04/22/2022   No results found for: "HEPBSAB" Lab Results  Component Value Date   LABRPR NON-REACTIVE 03/01/2021    CBC Lab Results  Component Value Date   WBC 5.3 04/22/2022   RBC 4.07 (L) 04/22/2022   HGB 11.2 (L) 04/22/2022   HCT 33.3 (L) 04/22/2022   PLT 413 (H) 04/22/2022   MCV 81.8 04/22/2022   MCH 27.5 04/22/2022   MCHC 33.6 04/22/2022   RDW 14.4 04/22/2022   LYMPHSABS 1.8 04/21/2022   MONOABS 0.7 04/21/2022   EOSABS 0.1 04/21/2022    BMET Lab Results  Component Value Date   NA 130 (L) 04/22/2022   K 4.4 04/22/2022   CL 94 (L) 04/22/2022   CO2 26 04/22/2022   GLUCOSE 123 (H) 04/22/2022   BUN 11 04/22/2022   CREATININE 0.83 04/22/2022   CALCIUM 9.2 04/22/2022   GFRNONAA >60 04/22/2022   GFRAA 99 09/22/2019    Assessment and Plan  Reviewed his drug resistance and medication that does not appear to have drug interaction with his upcoming chemo regimen  Continue on symtuza for now and will  continue to monitor closely and when he may need to start oi proph.  See back in 2 months

## 2022-05-12 ENCOUNTER — Ambulatory Visit: Payer: Medicare Other

## 2022-05-12 ENCOUNTER — Inpatient Hospital Stay: Payer: Medicare Other

## 2022-05-12 ENCOUNTER — Inpatient Hospital Stay: Payer: Medicare Other | Admitting: Dietician

## 2022-05-12 MED FILL — Fosaprepitant Dimeglumine For IV Infusion 150 MG (Base Eq): INTRAVENOUS | Qty: 5 | Status: AC

## 2022-05-12 MED FILL — Dexamethasone Sodium Phosphate Inj 100 MG/10ML: INTRAMUSCULAR | Qty: 1 | Status: AC

## 2022-05-13 ENCOUNTER — Other Ambulatory Visit: Payer: Self-pay

## 2022-05-15 ENCOUNTER — Ambulatory Visit
Admission: RE | Admit: 2022-05-15 | Discharge: 2022-05-15 | Disposition: A | Discharge status: Home / Self Care | Payer: Medicare Other | Source: Ambulatory Visit | Attending: Radiation Oncology | Admitting: Radiation Oncology

## 2022-05-15 ENCOUNTER — Inpatient Hospital Stay (HOSPITAL_BASED_OUTPATIENT_CLINIC_OR_DEPARTMENT_OTHER): Payer: Medicare Other | Admitting: Adult Health

## 2022-05-15 ENCOUNTER — Inpatient Hospital Stay: Payer: Medicare Other | Admitting: Licensed Clinical Social Worker

## 2022-05-15 ENCOUNTER — Inpatient Hospital Stay: Payer: Medicare Other

## 2022-05-15 ENCOUNTER — Other Ambulatory Visit: Payer: Medicare Other

## 2022-05-15 ENCOUNTER — Other Ambulatory Visit: Payer: Self-pay

## 2022-05-15 ENCOUNTER — Ambulatory Visit: Payer: Medicare Other

## 2022-05-15 VITALS — BP 114/79 | HR 58 | Temp 98.3°F | Resp 18 | Ht 72.0 in | Wt 148.8 lb

## 2022-05-15 DIAGNOSIS — G893 Neoplasm related pain (acute) (chronic): Secondary | ICD-10-CM | POA: Diagnosis not present

## 2022-05-15 DIAGNOSIS — C090 Malignant neoplasm of tonsillar fossa: Secondary | ICD-10-CM

## 2022-05-15 DIAGNOSIS — E860 Dehydration: Secondary | ICD-10-CM

## 2022-05-15 DIAGNOSIS — Z510 Encounter for antineoplastic radiation therapy: Secondary | ICD-10-CM | POA: Diagnosis not present

## 2022-05-15 DIAGNOSIS — R5381 Other malaise: Secondary | ICD-10-CM

## 2022-05-15 LAB — RAD ONC ARIA SESSION SUMMARY
Course Elapsed Days: 7
Plan Fractions Treated to Date: 5
Plan Prescribed Dose Per Fraction: 2 Gy
Plan Total Fractions Prescribed: 35
Plan Total Prescribed Dose: 70 Gy
Reference Point Dosage Given to Date: 10 Gy
Reference Point Session Dosage Given: 2 Gy
Session Number: 5

## 2022-05-15 LAB — TSH: TSH: 0.473 u[IU]/mL (ref 0.350–4.500)

## 2022-05-15 LAB — CBC WITH DIFFERENTIAL (CANCER CENTER ONLY)
Abs Immature Granulocytes: 0.08 10*3/uL — ABNORMAL HIGH (ref 0.00–0.07)
Basophils Absolute: 0 10*3/uL (ref 0.0–0.1)
Basophils Relative: 0 %
Eosinophils Absolute: 0.1 10*3/uL (ref 0.0–0.5)
Eosinophils Relative: 1 %
HCT: 35.2 % — ABNORMAL LOW (ref 39.0–52.0)
Hemoglobin: 11.9 g/dL — ABNORMAL LOW (ref 13.0–17.0)
Immature Granulocytes: 1 %
Lymphocytes Relative: 3 %
Lymphs Abs: 0.3 10*3/uL — ABNORMAL LOW (ref 0.7–4.0)
MCH: 28.3 pg (ref 26.0–34.0)
MCHC: 33.8 g/dL (ref 30.0–36.0)
MCV: 83.6 fL (ref 80.0–100.0)
Monocytes Absolute: 0.8 10*3/uL (ref 0.1–1.0)
Monocytes Relative: 7 %
Neutro Abs: 9.3 10*3/uL — ABNORMAL HIGH (ref 1.7–7.7)
Neutrophils Relative %: 88 %
Platelet Count: 217 10*3/uL (ref 150–400)
RBC: 4.21 MIL/uL — ABNORMAL LOW (ref 4.22–5.81)
RDW: 15.5 % (ref 11.5–15.5)
WBC Count: 10.5 10*3/uL (ref 4.0–10.5)
nRBC: 0 % (ref 0.0–0.2)

## 2022-05-15 LAB — BASIC METABOLIC PANEL - CANCER CENTER ONLY
Anion gap: 7 (ref 5–15)
BUN: 14 mg/dL (ref 8–23)
CO2: 29 mmol/L (ref 22–32)
Calcium: 8.9 mg/dL (ref 8.9–10.3)
Chloride: 95 mmol/L — ABNORMAL LOW (ref 98–111)
Creatinine: 0.79 mg/dL (ref 0.61–1.24)
GFR, Estimated: >60 mL/min (ref >60)
Glucose, Bld: 105 mg/dL — ABNORMAL HIGH (ref 70–99)
Potassium: 4.1 mmol/L (ref 3.5–5.1)
Sodium: 131 mmol/L — ABNORMAL LOW (ref 135–145)

## 2022-05-15 LAB — MAGNESIUM: Magnesium: 1.9 mg/dL (ref 1.7–2.4)

## 2022-05-15 MED ORDER — OXYCODONE HCL 5 MG PO TABS: 5.0000 mg | ORAL_TABLET | Freq: Once | ORAL | Status: DC

## 2022-05-15 MED ORDER — OXYCODONE HCL 5 MG PO TABS
5.0000 mg | ORAL_TABLET | ORAL | 0 refills | Status: DC | PRN
Start: 2022-05-15 — End: 2022-05-18

## 2022-05-15 MED ORDER — SODIUM CHLORIDE 0.9 % IV SOLN: INTRAVENOUS | Status: DC

## 2022-05-15 MED ORDER — OXYCODONE HCL 5 MG PO TABS
5.0000 mg | ORAL_TABLET | Freq: Once | ORAL | Status: AC
  Administered 2022-05-15: 5 mg via ORAL
  Filled 2022-05-15: qty 1

## 2022-05-15 NOTE — Assessment & Plan Note (Signed)
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  Stage III squamous cell carcinoma: He is tolerating the radiation moderately well however due to his dehydration, fatigue, and pain he refuses to receive chemotherapy with cisplatin today.  He wants to receive this another day this week.  I discussed with him the importance to go ahead and get started with the treatment today as it helps make the radiation worked better.  He still declined and I have asked for this to be rescheduled to another day this week. Neck pain: He is still in persistent pain with the hydrocodone that he is taking.  He will get some oxycodone today at 5 mg.  There is a potential interaction between this and his Symtuza.  I reviewed this with his infectious disease team and they recommended a 20% dose reduction at most--I will keep this in mind if I prescribe him this to take at home since the hydrocodone-acetaminophen is not helping his pain. Dehydration: He needs IV fluids today.  I have written for 1 L of normal saline.  He appears to have euvolemic hyponatremia noted on his labs. Severe protein calorie malnutrition: He has had a an 11 pound weight loss in the past 2 weeks.  He is meeting with nutrition today. Health literacy: I spoke with the head neck navigator about my concern that he was under the impression that he needed to go to the emergency room to have his port placed.  My nurse is reaching out to the IR scheduler so hopefully we will be able to coordinate a time.  I reaffirmed with the patient not to go to the ER for port placement but that we will get him scheduled for it and in the meantime we can give him an IV.  Will likely need additional IV fluids and follow-up every week since he has declined G-tube placement as well.  We will see what day infusion can get him in for treatment and will adjust his treatment  plan accordingly.

## 2022-05-15 NOTE — Addendum Note (Signed)
Addended by: Lillard Anes C on: 05/15/2022 12:05 PM   Modules accepted: Orders

## 2022-05-15 NOTE — Progress Notes (Signed)
Nutrition Assessment   Reason for Assessment:  SCC of palatine tonsil   ASSESSMENT:  62 year old male with SCC of palatine tonsil, HPV+.  Past medical history of HIV, anal condyloma, cervical stenosis with myelopathy.  Planning concurrent chemotherapy and radiation.  Declined receiving first chemo today.  Received IV fluids.    Met with patient during IV fluids.  Patient reports that he has never been a breakfast eater.  Eats eggs, bacon, grits about 2 times a week.  Lunch is usually a sandwich.  Dinner last night was 15 chicken nuggets and macaroni and cheese.  Says that he is using lidocaine to help with pain when eating.  Says that he is tolerating all consistency of foods.  Drinking ensure/boost shakes.  Likes boost better.    Refused G-tube placement  Medications: lidocaine, ativan   Labs: Na 131, glucose 105   Anthropometrics:   Height: 72 inches Weight: 148 lb 12.8 oz 165 lb on 4/12 BMI: 20  10% weight loss in the last 3 weeks, significant   Estimated Energy Needs  Kcals: 9147-8295 Protein: 100-117 g Fluid: 2000-2345 ml   NUTRITION DIAGNOSIS: Unintentional weight loss related to SCC of palatine tonsil as evidenced by 10% weight loss in the last 3 weeks and reduced oral intake    INTERVENTION:  Discussed importance of nutrition during treatment and weight maintenance Discussed ways to add calories and protein in diet Reviewed foods that are soft, moist and high in calories and protein. Handout provided. Recommend 350+ calorie shake.  Complimentary case of ensure given to patient today and coupons   MONITORING, EVALUATION, GOAL: weight trends, intake   Next Visit: Monday, May 20 after radiation  [name], RD, LDN Registered Dietitian [phone]

## 2022-05-15 NOTE — Progress Notes (Signed)
[facility] CSW Progress Note  Visual merchandiser met with patient to provide supportive counseling in infusion. Pt feeling poor physically today but in much better spirits mentally and emotionally. He feels he is getting back to himself (more positive, realistic) since processing more information about his diagnosis. He is looking forward to starting treatments and understands he will feel worse initially but with end goal of feeling better.  Pt is concerned about his weight loss and getting nutrition supplements and will be meeting with RD today.  Otherwise, pt shared more about his family support, sisters, nieces/nephews and looking forward to meeting his almost 1yo grand-nephew soon.  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  CSW will continue to meet with pt through his treatment to offer coping support.    Adis Sturgill E Cledith Kamiya, LCSW Clinical Social Worker [facility]

## 2022-05-15 NOTE — Progress Notes (Addendum)
[facility] Cancer Follow up:    Pcp, No No address on file   DIAGNOSIS:  Cancer Staging  Cancer of tonsillar fossa (HCC) Staging form: Pharynx - HPV-Mediated Oropharynx, AJCC 8th Edition - Clinical stage from 04/25/2022: Stage III (cT3, cN3, cM0, p16+) - Unsigned Stage prefix: Initial diagnosis   SUMMARY OF ONCOLOGIC HISTORY: Oncology History  Cancer of tonsillar fossa (HCC)  02/2022 Initial Diagnosis   The patient initially presented to a provider located in [place] this past February for evaluation of a new right neck mass and sore throat. He was subsequently referred to ENT in [place] and had a soft tissue neck CT performed on 03/03/22 which demonstrated a large left neck mass and a tonsillar mass. Involvement of the left soft palate and possible invasion of the left tongue base were also appreciated. The scanned report is partially unreadable due to low quality, however findings detailed a a 6.1 cm mass-like area of matted left level 2 and level 3 lymph nodes with areas of necrosis, and a 6.2 cm fatty neoplasm posterior to the right sternocleidomastoid muscle.    03/15/2022 Initial Biopsy   Biopsy of the left neck mass: metastatic squamous cell carcinoma; p16 positive.    04/28/2022 PET scan   IMPRESSION: 1. 3.6 by 2.5 by 4.8 cm left tonsillar mass extending into the epiglottis, maximum SUV 13.9, compatible with malignancy. 2. Bulky conglomerate left level IIA adenopathy is hypermetabolic and compatible with malignancy. 3. 5.0 by 2.7 cm fatty mass along the superficial fascia margin of the right posterior neck, with internal stranding and mild internal nodularity. Low-grade associated activity, maximum SUV 1.4. Possibilities include low-grade liposarcoma and atypical lipoma. 4. Hypermetabolic activity along an exophytic density in the vicinity of the distal anal canal and perianal region, maximum SUV 8.6. Exophytic anal mass such as squamous cell carcinoma  not excluded. Visual inspection and digital rectal exam recommended for further assessment. 5. Severe right degenerative glenohumeral arthropathy with free osteochondral fragment in the subscapular recess of the joint and a smaller free fragment in the biceps recess of the joint, with suspected right glenohumeral joint effusion. There is also severe asymmetric degenerative right hip arthropathy. Mesoacromial os acromiale bilaterally, with associated degenerative findings on the right side. 6. Aortic atherosclerosis. 7. Emphysema.   Aortic Atherosclerosis (ICD10-I70.0) and Emphysema (ICD10-J43.9).     Electronically Signed   By: Gaylyn Rong M.D.   On: 04/28/2022 10:26   05/15/2022 -  Chemotherapy   Patient is on Treatment Plan : HEAD/NECK Cisplatin (40) q7d       CURRENT THERAPY: concurrent chemoradiation   INTERVAL HISTORY: [name] 62 y.o. male returns for f/u and evaluation prior to beginning concurrent chemoradiation.  [name] tells me that he is feeling very poorly today.  He did begin radiation today however he feels fatigued, has had difficulty eating and drinking over the past few days.  He has significant pain in his neck that extends up to his ear.  He is down 6 pounds since May 2--just 4 days prior to today.  He is prescribed hydrocodone-acetaminophen 2 tablets every 6 hours as needed for pain.  He says that his pain is a 10 and it may decrease his pain to an on but that does not last.  This is impacting his ability to swallow food and water.  He denies any constipation or increased somnolence from the hydrocodone that he was prescribed.  [name] tells me that he does not want to receive chemotherapy today  but is hopeful to receive it a different day this week.   Patient Active Problem List   Diagnosis Date Noted   Cancer of tonsillar fossa (HCC) 04/25/2022   SCC (squamous cell carcinoma) 04/24/2022   Normocytic anemia 04/24/2022   Chronic back pain  02/24/2020   Ileus (HCC) 03/24/2019   MDD (major depressive disorder), recurrent episode (HCC) 04/20/2018   Chronic right-sided low back pain 04/16/2018   Weakness of right lower extremity 04/16/2018   Healthcare maintenance 02/04/2018   Blood loss anemia 01/28/2018   Rectal bleeding 01/28/2018   Alcohol dependence with alcohol-induced mood disorder (HCC)    Major depressive disorder, recurrent severe without psychotic features (HCC) 08/01/2017   Dysphagia 07/08/2017   Stenosis of cervical spine with myelopathy (HCC) 07/06/2017   Left-sided weakness 06/27/2017   Tricompartment osteoarthritis of right knee 03/27/2017   Alcohol abuse with alcohol-induced mood disorder (HCC) 09/25/2016   Polysubstance dependence (HCC) 09/25/2016   Substance use disorder 09/19/2016   MDD (major depressive disorder), recurrent severe, without psychosis (HCC) 09/18/2016   Cocaine abuse (HCC) 07/27/2015   Suicidal ideation 07/27/2015   Cocaine dependence with intoxication with complication (HCC) 07/23/2015   Feeling like committing suicide 07/23/2015   Metabolic acidosis 07/23/2015   Tobacco abuse 07/23/2015   Substance induced mood disorder (HCC) 07/19/2015   Bipolar 1 disorder, depressed (HCC) 07/18/2015   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   Hemorrhoids    HIV (human immunodeficiency virus infection) (HCC)    Stenosis of cervical spine    withb myelopathy    SURGICAL HISTORY: Past Surgical History:  Procedure Laterality Date   ANTERIOR CERVICAL DECOMP/DISCECTOMY FUSION N/A 07/06/2017   Procedure: ANTERIOR CERVICAL DECOMPRESSION/DISCECTOMY FUSION C3-4/C4-5 2 LEVELS;  Surgeon: Lisbeth Renshaw, MD;  Location: [facility];  Service: Neurosurgery;  Laterality: N/A;   HERNIA REPAIR  97   lt ing   RECTAL EXAM  UNDER ANESTHESIA N/A 04/24/2014   Procedure:  ligation of bleeding vessels;  Surgeon: Almond Lint, MD;  Location: [facility];  Service: General;  Laterality: N/A;   TENDON REPAIR     2006-lt index   WART FULGURATION  07/06/2011   Procedure: FULGURATION ANAL WART;  Surgeon: Shelly Rubenstein, MD;  Location: [facility];  Service: General;  Laterality: N/A;  excision anal condyloma   WART FULGURATION N/A 04/23/2014   Procedure: EXCISION OF ANAL CONDYLOMA;  Surgeon: Abigail Miyamoto, MD;  Location: [facility];  Service: General;  Laterality: N/A;    SOCIAL HISTORY: Social History   Socioeconomic History   Marital status: Single    Spouse name: Not on file   Number of children: Not on file   Years of education: Not on file   Highest education level: Not on file  Occupational History   Occupation: Unemployed  Tobacco Use   Smoking status: Every Day    Packs/day: 0.50    Years: 25.00    Additional pack years: 0.00    Total pack years: 12.50    Types: Cigarettes   Smokeless tobacco: Never  Vaping Use   Vaping Use: Never used  Substance and Sexual Activity   Alcohol use: Yes    Alcohol/week: 24.0 standard drinks of alcohol    Types: 24 Cans of beer per week    Comment: ''I can drink a case  a day''   Drug use: Yes    Types: "Crack" cocaine    Comment: pt reports last use was Wednesday   Sexual activity: Not Currently    Comment: pt declined condoms 09/22/19  Other Topics Concern   Not on file  Social History Narrative   Pt lives alone; receives Tree surgeon.   Social Determinants of Health   Financial Resource Strain: Not on file  Food Insecurity: Not on file  Transportation Needs: Not on file  Physical Activity: Not on file  Stress: Not on file  Social Connections: Not on file  Intimate Partner Violence: Not on file    FAMILY HISTORY: Family History  Problem Relation Age of Onset   Cancer Mother        brain   Cancer Sister        breast    Review of  Systems  Constitutional:  Positive for fatigue and unexpected weight change. Negative for appetite change, chills and fever.  HENT:   Positive for sore throat and trouble swallowing. Negative for hearing loss and lump/mass.   Eyes:  Negative for eye problems and icterus.  Respiratory:  Negative for chest tightness, cough and shortness of breath.   Cardiovascular:  Negative for chest pain, leg swelling and palpitations.  Gastrointestinal:  Negative for abdominal distention, abdominal pain, constipation, diarrhea, nausea and vomiting.  Endocrine: Negative for hot flashes.  Genitourinary:  Negative for difficulty urinating.   Musculoskeletal:  Negative for arthralgias.  Skin:  Negative for itching and rash.  Neurological:  Negative for dizziness, extremity weakness, headaches and numbness.  Hematological:  Negative for adenopathy. Does not bruise/bleed easily.  Psychiatric/Behavioral:  Negative for depression. The patient is not nervous/anxious.       PHYSICAL EXAMINATION    Vitals:   05/15/22 0940  BP: 114/79  Pulse: (!) 58  Resp: 18  Temp: 98.3 F (36.8 C)  SpO2: 100%    Physical Exam Constitutional:      General: He is not in acute distress.    Appearance: Normal appearance. He is not toxic-appearing.  HENT:     Head: Normocephalic and atraumatic.     Mouth/Throat:     Mouth: Mucous membranes are dry.     Pharynx: Oropharynx is clear.  Eyes:     General: No scleral icterus. Cardiovascular:     Rate and Rhythm: Normal rate and regular rhythm.     Pulses: Normal pulses.     Heart sounds: Normal heart sounds.  Pulmonary:     Effort: Pulmonary effort is normal.     Breath sounds: Normal breath sounds.  Abdominal:     General: Abdomen is flat. Bowel sounds are normal. There is no distension.     Palpations: Abdomen is soft.     Tenderness: There is no abdominal tenderness.  Musculoskeletal:        General: No swelling.     Cervical back: Neck supple.   Lymphadenopathy:     Cervical: Cervical adenopathy (Right neck cervical adenopathy, left neck with hard mass over cervical lymph nodes.) present.  Skin:    General: Skin is warm and dry.     Findings: No rash.  Neurological:     General: No focal deficit present.     Mental Status: He is alert.  Psychiatric:        Mood and Affect: Mood normal.        Behavior: Behavior normal.     LABORATORY DATA:  CBC  Component Value Date/Time   WBC 10.5 05/15/2022 0916   WBC 5.3 04/22/2022 0741   RBC 4.21 (L) 05/15/2022 0916   HGB 11.9 (L) 05/15/2022 0916   HCT 35.2 (L) 05/15/2022 0916   PLT 217 05/15/2022 0916   MCV 83.6 05/15/2022 0916   MCH 28.3 05/15/2022 0916   MCHC 33.8 05/15/2022 0916   RDW 15.5 05/15/2022 0916   LYMPHSABS 0.3 (L) 05/15/2022 0916   MONOABS 0.8 05/15/2022 0916   EOSABS 0.1 05/15/2022 0916   BASOSABS 0.0 05/15/2022 0916    CMP     Component Value Date/Time   NA 131 (L) 05/15/2022 0916   K 4.1 05/15/2022 0916   CL 95 (L) 05/15/2022 0916   CO2 29 05/15/2022 0916   GLUCOSE 105 (H) 05/15/2022 0916   BUN 14 05/15/2022 0916   CREATININE 0.79 05/15/2022 0916   CREATININE 1.07 03/01/2021 1649   CALCIUM 8.9 05/15/2022 0916   PROT 7.4 04/21/2022 1957   ALBUMIN 3.9 04/21/2022 1957   AST 31 04/21/2022 1957   ALT 21 04/21/2022 1957   ALKPHOS 44 04/21/2022 1957   BILITOT 0.6 04/21/2022 1957   GFRNONAA >60 05/15/2022 0916   GFRNONAA 86 09/22/2019 1121   GFRAA 99 09/22/2019 1121        ASSESSMENT and THERAPY PLAN:   Cancer of tonsillar fossa (HCC) [name] is a 62 year old man with 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  Stage III squamous cell carcinoma: He is tolerating the radiation moderately well however due to his dehydration, fatigue, and pain he refuses to receive chemotherapy with cisplatin today.  He wants to receive this another day this week.  I discussed with  him the importance to go ahead and get started with the treatment today as it helps make the radiation worked better.  He still declined and I have asked for this to be rescheduled to another day this week. Neck pain: He is still in persistent pain with the hydrocodone that he is taking.  He will get some oxycodone today at 5 mg.  There is a potential interaction between this and his Symtuza.  I reviewed this with his infectious disease team and they recommended a 20% dose reduction at most--I will keep this in mind if I prescribe him this to take at home since the hydrocodone-acetaminophen is not helping his pain. Dehydration: He needs IV fluids today.  I have written for 1 L of normal saline.  He appears to have euvolemic hyponatremia noted on his labs. Severe protein calorie malnutrition: He has had a an 11 pound weight loss in the past 2 weeks.  He is meeting with nutrition today. Health literacy: I spoke with the head neck navigator about my concern that he was under the impression that he needed to go to the emergency room to have his port placed.  My nurse is reaching out to the IR scheduler so hopefully we will be able to coordinate a time.  I reaffirmed with the patient not to go to the ER for port placement but that we will get him scheduled for it and in the meantime we can give him an IV.  Will likely need additional IV fluids and follow-up every week since he has declined G-tube placement as well.  We will see what day infusion can get him in for treatment and will adjust his treatment plan accordingly.   All questions were answered. The patient knows to  call the clinic with any problems, questions or concerns. We can certainly see the patient much sooner if necessary.  Total encounter time:45 minutes*in face-to-face visit time, chart review, lab review, care coordination, order entry, and documentation of the encounter time.    [name] Anes, NP 05/15/22 10:30 AM Medical Oncology and  Hematology [facility] [address] [place], [place] [address] Tel. [phone]    Fax. [phone]  *Total Encounter Time as defined by the Centers for Medicare and Medicaid Services includes, in addition to the face-to-face time of a patient visit (documented in the note above) non-face-to-face time: obtaining and reviewing outside history, ordering and reviewing medications, tests or procedures, care coordination (communications with other health care professionals or caregivers) and documentation in the medical record.   ADDENDUM:   Per Vista Deck RN who administered Oxycodone 5 mg PO to him it improved his pain in his neck and from his neck mass.  I prescribed Oxycodone 5mg  #16 for him to take and will see him later this week to re-assess his pain and potential adverse effects.    [name] Anes, NP 05/15/22 12:05 PM

## 2022-05-15 NOTE — Progress Notes (Signed)
Patient's treatment HELD today d/t Patient request. Per Mardella Layman NP Patient to receive IVFs today and 1 dose of Oxycodone PO 5 mg in infusion. Patient stated to this RN "today is not a good day for me, I do not feel good".   This RN educated Pt on new at home pain medication per [place] NP's recommendation. Pt was made aware to discontinue hydrocodone prescription and to take oxycodone for pain management. Pt verbalized understanding and was agreeable with change in pain meds. This RN educated Pt on new appointments and provided Pt a copy of updated schedule.Pt was agreebable with new schedule.

## 2022-05-15 NOTE — Patient Instructions (Signed)
Dehydration, Adult Dehydration is a condition in which there is not enough water or other fluids in the body. This happens when a person loses more fluids than they take in. Important organs cannot work right without the right amount of fluids. Any loss of fluids from the body can cause dehydration. Dehydration can be mild, worse, or very bad. It should be treated right away to keep it from getting very bad. What are the causes? Conditions that cause loss of water in the body. They include: Watery poop (diarrhea). Vomiting. Sweating a lot. Fever. Infection. Peeing (urinating) a lot. Not drinking enough fluids. Certain medicines, such as medicines that take extra fluid out of the body (diuretics). Lack of safe drinking water. Not being able to get enough water and food. What increases the risk? Having a long-term (chronic) illness that has not been treated the right way, such as: Diabetes. Heart disease. Kidney disease. Being 65 years of age or older. Having a disability. Living in a place that is high above the ground or sea (high in altitude). The thinner, drier air causes more fluid loss. Doing exercises that put stress on your body for a long time. Being active when in hot places. What are the signs or symptoms? Symptoms of dehydration depend on how bad it is. Mild or worse dehydration Thirst. Dry lips or dry mouth. Feeling dizzy or light-headed. Muscle cramps. Passing little pee or dark pee. Pee may be the color of tea. Headache. Very bad dehydration Changes in skin. Skin may: Be cold to the touch (clammy). Be blotchy or pale. Not go back to normal right after you pinch it and let it go. Little or no tears, pee, or sweat. Fast breathing. Low blood pressure. Weak pulse. Pulse that is more than 100 beats a minute when you are sitting still. Other changes, such as: Feeling very thirsty. Eyes that look hollow (sunken). Cold hands and feet. Being confused. Being very  tired (lethargic) or having trouble waking from sleep. Losing weight. Loss of consciousness. How is this treated? Treatment for this condition depends on how bad your dehydration is. Treatment should start right away. Do not wait until your condition gets very bad. Very bad dehydration is an emergency. You will need to go to a hospital. Mild or worse dehydration can be treated at home. You may be asked to: Drink more fluids. Drink an oral rehydration solution (ORS). This drink gives you the right amount of fluids, salts, and minerals (electrolytes). Very bad dehydration can be treated: With fluids through an IV tube. By correcting low levels of electrolytes in the body. By treating the problem that caused your dehydration. Follow these instructions at home: Oral rehydration solution If told by your doctor, drink an ORS: Make an ORS. Use instructions on the package. Start by drinking small amounts, about  cup (120 mL) every 5-10 minutes. Slowly drink more until you have had the amount that your doctor said to have.  Eating and drinking  Drink enough clear fluid to keep your pee pale yellow. If you were told to drink an ORS, finish the ORS first. Then, start slowly drinking other clear fluids. Drink fluids such as: Water. Do not drink only water. Doing that can make the salt (sodium) level in your body get too low. Water from ice chips you suck on. Fruit juice that you have added water to (diluted). Low-calorie sports drinks. Eat foods that have the right amounts of salts and minerals, such as bananas, oranges, potatoes,   tomatoes, or spinach. Do not drink alcohol. Avoid drinks that have caffeine or sugar. These include:: High-calorie sports drinks. Fruit juice that you did not add water to. Soda. Coffee or energy drinks. Avoid foods that are greasy or have a lot of fat or sugar. General instructions Take over-the-counter and prescription medicines only as told by your doctor. Do  not take sodium tablets. Doing that can make the salt level in your body get too high. Return to your normal activities as told by your doctor. Ask your doctor what activities are safe for you. Keep all follow-up visits. Your doctor may check and change your treatment. Contact a doctor if: You have pain in your belly (abdomen) and the pain: Gets worse. Stays in one place. You have a rash. You have a stiff neck. You get angry or annoyed more easily than normal. You are more tired or have a harder time waking than normal. You feel weak or dizzy. You feel very thirsty. Get help right away if: You have any symptoms of very bad dehydration. You vomit every time you eat or drink. Your vomiting gets worse, does not go away, or you vomit blood or green stuff. You are getting treatment, but symptoms are getting worse. You have a fever. You have a very bad headache. You have: Diarrhea that gets worse or does not go away. Blood in your poop (stool). This may cause poop to look black and tarry. No pee in 6-8 hours. Only a small amount of pee in 6-8 hours, and the pee is very dark. You have trouble breathing. These symptoms may be an emergency. Get help right away. Call 911. Do not wait to see if the symptoms will go away. Do not drive yourself to the hospital. This information is not intended to replace advice given to you by your health care provider. Make sure you discuss any questions you have with your health care provider.  Rehydration, Adult Rehydration is the replacement of fluids, salts, and minerals in the body (electrolytes) that are lost during dehydration. Dehydration is when there is not enough water or other fluids in the body. This happens when you lose more fluids than you take in. Common causes of dehydration include: Not drinking enough fluids. This can occur when you are ill or doing activities that require a lot of energy, especially in hot weather. Conditions that cause  loss of water or other fluids. These include diarrhea, vomiting, sweating, and urinating a lot. Other illnesses, such as fever or infection. Certain medicines, such as those that remove excess fluid from the body (diuretics). Symptoms of mild or moderate dehydration may include thirst, dry lips and mouth, and dizziness. Symptoms of severe dehydration may include increased heart rate, confusion, fainting, and not urinating. In severe cases, you may need to get fluids through an IV at the hospital. For mild or moderate cases, you can usually rehydrate at home by drinking certain fluids as told by your health care provider. What are the risks? Your health care provider will talk with you about risks. Your health care provider will talk with you about risks. This may include taking in too much fluid (overhydration). This is rare. Overhydration can cause an imbalance of electrolytes in the body, kidney failure, or a decrease in salt (sodium) levels in the body. Supplies needed: You will need an oral rehydration solution (ORS) if your health care provider tells you to use one. This is a drink to treat dehydration. It can be found  in pharmacies and retail stores. How to rehydrate Fluids Follow instructions from your health care provider about what to drink. The kind of fluid and the amount you should drink depend on your condition. In general, you should choose drinks that you prefer. If told by your health care provider, drink an ORS. Make an ORS by following instructions on the package. Start by drinking small amounts, about  cup (120 mL) every 5-10 minutes. Slowly increase how much you drink until you have taken in the amount recommended by your health care provider. Drink enough clear fluids to keep your urine pale yellow. If you were told to drink an ORS, finish it first, then start slowly drinking other clear fluids. Drink fluids such as: Water. This includes sparkling and flavored water. Drinking  only water can lead to having too little sodium in your body (hyponatremia). Follow the advice of your health care provider. Water from ice chips you suck on. Fruit juice with water added to it (diluted). Sports drinks. Hot or cold herbal teas. Broth-based soups. Milk or milk products. Food Follow instructions from your health care provider about what to eat while you rehydrate. Your health care provider may recommend that you slowly begin eating regular foods in small amounts. Eat foods that contain a healthy balance of electrolytes, such as bananas, oranges, potatoes, tomatoes, and spinach. Avoid foods that are greasy or contain a lot of sugar. In some cases, you may get nutrition through a feeding tube that is passed through your nose and into your stomach (nasogastric tube, or NG tube). This may be done if you have uncontrolled vomiting or diarrhea. Drinks to avoid  Certain drinks may make dehydration worse. While you rehydrate, avoid drinking alcohol. How to tell if you are recovering from dehydration You may be getting better if: You are urinating more often than before you started rehydrating. Your urine is pale yellow. Your energy level improves. You vomit less often. You have diarrhea less often. Your appetite improves or returns to normal. You feel less dizzy or light-headed. Your skin tone and color start to look more normal. Follow these instructions at home: Take over-the-counter and prescription medicines only as told by your health care provider. Do not take sodium tablets. Doing this can lead to having too much sodium in your body (hypernatremia). Contact a health care provider if: You continue to have symptoms of mild or moderate dehydration, such as: Thirst. Dry lips. Slightly dry mouth. Dizziness. Dark urine or less urine than normal. Muscle cramps. You continue to vomit or have diarrhea. Get help right away if: You have symptoms of dehydration that get  worse. You have a fever. You have a severe headache. You have been vomiting and have problems, such as: Your vomiting gets worse or does not go away. Your vomit includes blood or green matter (bile). You cannot eat or drink without vomiting. You have problems with urination or bowel movements, such as: Diarrhea that gets worse or does not go away. Blood in your stool (feces). This may cause stool to look black and tarry. Not urinating, or urinating only a small amount of very dark urine, within 6-8 hours. You have trouble breathing. You have symptoms that get worse with treatment. These symptoms may be an emergency. Get help right away. Call 911. Do not wait to see if the symptoms will go away. Do not drive yourself to the hospital. This information is not intended to replace advice given to you by your health care  provider. Make sure you discuss any questions you have with your health care provider. Document Revised: 05/09/2021 Document Reviewed: 05/09/2021 Elsevier Patient Education  2023 [employer]  Document Revised: 07/25/2021 Document Reviewed: 07/25/2021 Elsevier Patient Education  2023 [employer].

## 2022-05-16 ENCOUNTER — Other Ambulatory Visit: Payer: Self-pay

## 2022-05-16 ENCOUNTER — Other Ambulatory Visit: Payer: Self-pay | Admitting: Radiation Oncology

## 2022-05-16 ENCOUNTER — Ambulatory Visit: Payer: Medicare Other

## 2022-05-16 ENCOUNTER — Telehealth: Payer: Self-pay | Admitting: Adult Health

## 2022-05-16 ENCOUNTER — Inpatient Hospital Stay
Admission: RE | Admit: 2022-05-16 | Discharge: 2022-05-16 | Disposition: A | Discharge status: Home / Self Care | Payer: Self-pay | Source: Ambulatory Visit | Attending: Radiation Oncology | Admitting: Radiation Oncology

## 2022-05-16 ENCOUNTER — Ambulatory Visit: Admission: RE | Admit: 2022-05-16 | Payer: Medicare Other | Source: Ambulatory Visit

## 2022-05-16 DIAGNOSIS — C099 Malignant neoplasm of tonsil, unspecified: Secondary | ICD-10-CM

## 2022-05-16 NOTE — Telephone Encounter (Signed)
Scheduled appointments per staff message. Left detailed voicemail.

## 2022-05-17 ENCOUNTER — Other Ambulatory Visit: Payer: Self-pay

## 2022-05-17 ENCOUNTER — Ambulatory Visit: Payer: Medicare Other

## 2022-05-17 ENCOUNTER — Ambulatory Visit
Admission: RE | Admit: 2022-05-17 | Discharge: 2022-05-17 | Disposition: A | Discharge status: Home / Self Care | Payer: Medicare Other | Source: Ambulatory Visit | Attending: Radiation Oncology | Admitting: Radiation Oncology

## 2022-05-17 DIAGNOSIS — Z510 Encounter for antineoplastic radiation therapy: Secondary | ICD-10-CM | POA: Diagnosis not present

## 2022-05-17 DIAGNOSIS — C090 Malignant neoplasm of tonsillar fossa: Secondary | ICD-10-CM

## 2022-05-17 DIAGNOSIS — E860 Dehydration: Secondary | ICD-10-CM

## 2022-05-17 DIAGNOSIS — G893 Neoplasm related pain (acute) (chronic): Secondary | ICD-10-CM

## 2022-05-17 DIAGNOSIS — C760 Malignant neoplasm of head, face and neck: Secondary | ICD-10-CM

## 2022-05-17 LAB — RAD ONC ARIA SESSION SUMMARY
Course Elapsed Days: 9
Plan Fractions Treated to Date: 6
Plan Prescribed Dose Per Fraction: 2 Gy
Plan Total Fractions Prescribed: 35
Plan Total Prescribed Dose: 70 Gy
Reference Point Dosage Given to Date: 12 Gy
Reference Point Session Dosage Given: 2 Gy
Session Number: 6

## 2022-05-18 ENCOUNTER — Encounter: Payer: Self-pay | Admitting: Adult Health

## 2022-05-18 ENCOUNTER — Ambulatory Visit
Admission: RE | Admit: 2022-05-18 | Discharge: 2022-05-18 | Disposition: A | Discharge status: Home / Self Care | Payer: Medicare Other | Source: Ambulatory Visit | Attending: Radiation Oncology | Admitting: Radiation Oncology

## 2022-05-18 ENCOUNTER — Inpatient Hospital Stay: Payer: Medicare Other

## 2022-05-18 ENCOUNTER — Encounter: Payer: Medicare Other | Admitting: Nutrition

## 2022-05-18 ENCOUNTER — Inpatient Hospital Stay (HOSPITAL_BASED_OUTPATIENT_CLINIC_OR_DEPARTMENT_OTHER): Payer: Medicare Other | Admitting: Adult Health

## 2022-05-18 ENCOUNTER — Other Ambulatory Visit: Payer: Self-pay

## 2022-05-18 ENCOUNTER — Other Ambulatory Visit: Payer: Self-pay | Admitting: Internal Medicine

## 2022-05-18 ENCOUNTER — Ambulatory Visit: Payer: Medicare Other

## 2022-05-18 ENCOUNTER — Inpatient Hospital Stay: Payer: Medicare Other | Admitting: Nutrition

## 2022-05-18 DIAGNOSIS — C090 Malignant neoplasm of tonsillar fossa: Secondary | ICD-10-CM

## 2022-05-18 DIAGNOSIS — E860 Dehydration: Secondary | ICD-10-CM

## 2022-05-18 DIAGNOSIS — G893 Neoplasm related pain (acute) (chronic): Secondary | ICD-10-CM | POA: Diagnosis not present

## 2022-05-18 DIAGNOSIS — C760 Malignant neoplasm of head, face and neck: Secondary | ICD-10-CM

## 2022-05-18 DIAGNOSIS — Z510 Encounter for antineoplastic radiation therapy: Secondary | ICD-10-CM | POA: Diagnosis not present

## 2022-05-18 LAB — CBC WITH DIFFERENTIAL (CANCER CENTER ONLY)
Abs Immature Granulocytes: 0.03 10*3/uL (ref 0.00–0.07)
Basophils Absolute: 0 10*3/uL (ref 0.0–0.1)
Basophils Relative: 0 %
Eosinophils Absolute: 0.1 10*3/uL (ref 0.0–0.5)
Eosinophils Relative: 2 %
HCT: 30.7 % — ABNORMAL LOW (ref 39.0–52.0)
Hemoglobin: 10.3 g/dL — ABNORMAL LOW (ref 13.0–17.0)
Immature Granulocytes: 1 %
Lymphocytes Relative: 7 %
Lymphs Abs: 0.5 10*3/uL — ABNORMAL LOW (ref 0.7–4.0)
MCH: 27.9 pg (ref 26.0–34.0)
MCHC: 33.6 g/dL (ref 30.0–36.0)
MCV: 83.2 fL (ref 80.0–100.0)
Monocytes Absolute: 0.7 10*3/uL (ref 0.1–1.0)
Monocytes Relative: 11 %
Neutro Abs: 4.7 10*3/uL (ref 1.7–7.7)
Neutrophils Relative %: 79 %
Platelet Count: 297 10*3/uL (ref 150–400)
RBC: 3.69 MIL/uL — ABNORMAL LOW (ref 4.22–5.81)
RDW: 15.5 % (ref 11.5–15.5)
WBC Count: 6.1 10*3/uL (ref 4.0–10.5)
nRBC: 0 % (ref 0.0–0.2)

## 2022-05-18 LAB — BASIC METABOLIC PANEL - CANCER CENTER ONLY
Anion gap: 7 (ref 5–15)
BUN: 8 mg/dL (ref 8–23)
CO2: 29 mmol/L (ref 22–32)
Calcium: 8.6 mg/dL — ABNORMAL LOW (ref 8.9–10.3)
Chloride: 94 mmol/L — ABNORMAL LOW (ref 98–111)
Creatinine: 0.66 mg/dL (ref 0.61–1.24)
GFR, Estimated: >60 mL/min (ref >60)
Glucose, Bld: 80 mg/dL (ref 70–99)
Potassium: 3.8 mmol/L (ref 3.5–5.1)
Sodium: 130 mmol/L — ABNORMAL LOW (ref 135–145)

## 2022-05-18 LAB — RAD ONC ARIA SESSION SUMMARY
Course Elapsed Days: 10
Plan Fractions Treated to Date: 7
Plan Prescribed Dose Per Fraction: 2 Gy
Plan Total Fractions Prescribed: 35
Plan Total Prescribed Dose: 70 Gy
Reference Point Dosage Given to Date: 14 Gy
Reference Point Session Dosage Given: 2 Gy
Session Number: 7

## 2022-05-18 LAB — GLUCOSE 6 PHOSPHATE DEHYDROGENASE: G-6PDH: 18.1 U/g Hgb (ref 7.0–20.5)

## 2022-05-18 MED ORDER — OXYCODONE HCL 5 MG PO TABS
5.0000 mg | ORAL_TABLET | ORAL | 0 refills | Status: DC | PRN
Start: 2022-05-18 — End: 2022-05-25

## 2022-05-18 MED ORDER — SODIUM CHLORIDE 0.9 % IV SOLN: INTRAVENOUS | Status: DC

## 2022-05-18 NOTE — Progress Notes (Signed)
Pharmacist Chemotherapy Monitoring - Initial Assessment    Anticipated start date: 05/25/22   The following has been reviewed per standard work regarding the patient's treatment regimen: The patient's diagnosis, treatment plan and drug doses, and organ/hematologic function Lab orders and baseline tests specific to treatment regimen  The treatment plan start date, drug sequencing, and pre-medications Prior authorization status  Patient's documented medication list, including drug-drug interaction screen and prescriptions for anti-emetics and supportive care specific to the treatment regimen The drug concentrations, fluid compatibility, administration routes, and timing of the medications to be used The patient's access for treatment and lifetime cumulative dose history, if applicable  The patient's medication allergies and previous infusion related reactions, if applicable   Changes made to treatment plan:  N/A  Follow up needed:  drug-drug interaction between Fosaprepitant (EMEND) & darunavir + cobicistat (components of SYMTUZA).  MD notified to consider alternate plan for antiemetic.   Ebony Hail, Pharm.D., CPP 05/18/2022@3 :24 PM

## 2022-05-18 NOTE — Patient Instructions (Signed)

## 2022-05-18 NOTE — Progress Notes (Signed)
Oncology Nurse Navigator Documentation   I met [name] after his radiation appointment today and assisted upstairs to receive his IVFs as scheduled. He tells me that he is dehydrated and not feeling well today. He will meet with Lillard Anes NP today as well while receiving IVFs. I reminded him about his PAC placement tomorrow with an arrival time of 9:30 to [facility]. I also reminded him that he needed to be NPO after midnight and have a responsible driver to bring him home. He voiced his understanding. I encouraged him to call me with any questions or concerns at any time.   Hedda Slade RN, BSN, OCN Head & Neck Oncology Nurse Navigator [facility] Phone # [phone]  Fax # [phone]

## 2022-05-18 NOTE — Progress Notes (Signed)
Per Dr Al Pimple, ok to d/c Emend since drug interaction w/ Symtuza.  Will monitor for CINV.  Ebony Hail, Pharm.D., CPP 05/18/2022@5 :12 PM

## 2022-05-18 NOTE — Consult Note (Signed)
Chief Complaint: Patient was seen in consultation today for port a cath placement  Referring Physician(s): Iruku,Praveena  Supervising Physician: Ruel Favors  Patient Status: [facility] - Out-pt  History of Present Illness: [name] is a 62 y.o. 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 He is scheduled today for port a cath placement to assist with treatment.    Past Surgical History:  Procedure Laterality Date   ANTERIOR CERVICAL DECOMP/DISCECTOMY FUSION N/A 07/06/2017   Procedure: ANTERIOR CERVICAL DECOMPRESSION/DISCECTOMY FUSION C3-4/C4-5 2 LEVELS;  Surgeon: Lisbeth Renshaw, MD;  Location: [facility] OR;  Service: Neurosurgery;  Laterality: N/A;   HERNIA REPAIR  97   lt ing   RECTAL EXAM UNDER ANESTHESIA N/A 04/24/2014   Procedure:  ligation of bleeding vessels;  Surgeon: Almond Lint, MD;  Location: [facility] ORS;  Service: General;  Laterality: N/A;   TENDON REPAIR     2006-lt index   WART FULGURATION  07/06/2011   Procedure: FULGURATION ANAL WART;  Surgeon: Shelly Rubenstein, MD;  Location: [facility];  Service: General;  Laterality: N/A;  excision anal condyloma   WART FULGURATION N/A 04/23/2014   Procedure: EXCISION OF ANAL CONDYLOMA;  Surgeon: Abigail Miyamoto, MD;  Location: [facility];  Service: General;  Laterality: N/A;    Allergies: Shellfish allergy, Sulfa antibiotics, Fish allergy, and Clindamycin  Medications: Prior to Admission medications   Medication Sig Start Date End Date Taking? Authorizing Provider  amoxicillin-clavulanate (AUGMENTIN) 875-125 MG tablet Take 1 tablet by mouth 2 (two) times daily. Patient not taking: Reported on 05/11/2022 03/31/22   [provider]  aprepitant (EMEND) 80 & 125 MG capsule Take by mouth. 04/11/22   [provider]  aspirin EC 81 MG tablet  Take 81 mg by mouth daily.    [provider]  baclofen (LIORESAL) 20 MG tablet Take 20 mg by mouth 3 (three) times daily as needed for muscle spasms. 06/16/19   [provider]  buPROPion (WELLBUTRIN XL) 300 MG 24 hr tablet Take 300 mg by mouth daily. 06/13/19   [provider]  cloNIDine (CATAPRES) 0.1 MG tablet Take 0.1 mg by mouth 2 (two) times daily.    [provider]  Darunavir-Cobicistat-Emtricitabine-Tenofovir Alafenamide (SYMTUZA) 800-150-200-10 MG TABS Take 1 tablet by mouth daily with breakfast. 05/11/22   [name], MD  diphenhydrAMINE (BENADRYL) 25 MG tablet Take 25 mg by mouth every 6 (six) hours as needed for sleep.    [provider]  gabapentin (NEURONTIN) 800 MG tablet Take 800 mg by mouth 3 (three) times daily. 02/12/21   [provider]  granisetron (KYTRIL) 1 MG tablet Take by mouth. 04/10/22   [provider]  lidocaine (XYLOCAINE) 2 % solution Patient: Mix 1part 2% viscous lidocaine, 1part H20. Swish & swallow 10mL of diluted mixture, before meals and at bedtime, up to QID 04/28/22   Lonie Peak, MD  LORazepam (ATIVAN) 0.5 MG tablet Take one tablet - dissolve under tongue - PRN claustrophobia, 20 min prior to radiation. 04/28/22   Lonie Peak, MD  losartan (COZAAR) 25 MG tablet Take 25 mg by mouth daily. 11/25/20   [provider]  oxyCODONE (OXY IR/ROXICODONE) 5 MG immediate release tablet Take 1 tablet (5 mg total) by mouth every 4 (four) hours as needed for severe pain. 05/15/22   Loa Socks, NP  rosuvastatin (  CRESTOR) 5 MG tablet Take 5 mg by mouth daily. 02/13/22   [provider]     Family History  Problem Relation Age of Onset   Cancer Mother        brain   Cancer Sister        breast    Social History   Socioeconomic History   Marital status: Single    Spouse name: Not on file   Number of children: Not on file   Years of education: Not on file   Highest  education level: Not on file  Occupational History   Occupation: Unemployed  Tobacco Use   Smoking status: Every Day    Packs/day: 0.50    Years: 25.00    Additional pack years: 0.00    Total pack years: 12.50    Types: Cigarettes   Smokeless tobacco: Never  Vaping Use   Vaping Use: Never used  Substance and Sexual Activity   Alcohol use: Yes    Alcohol/week: 24.0 standard drinks of alcohol    Types: 24 Cans of beer per week    Comment: ''I can drink a case a day''   Drug use: Yes    Types: "Crack" cocaine    Comment: pt reports last use was Wednesday   Sexual activity: Not Currently    Comment: pt declined condoms 09/22/19  Other Topics Concern   Not on file  Social History Narrative   Pt lives alone; receives Tree surgeon.   Social Determinants of Health   Financial Resource Strain: Not on file  Food Insecurity: Not on file  Transportation Needs: Not on file  Physical Activity: Not on file  Stress: Not on file  Social Connections: Not on file      Review of Systems denies fever,HA,CP,dyspnea, abd /back pain,N/V or bleeding; he does have cough, neck pain  Vital Signs: Vitals:   05/19/22 0917  BP: (!) 140/87  Pulse: 78  Resp: 18  Temp: 98 F (36.7 C)  SpO2: 97%      Code Status: FULL CODE    Physical Exam; awake/alert; chest- CTA bilat; heart- RRR; abd- soft,+BS,NT; no LE edema; prominent post neck adenopathy/fatty mass, left tonsillar mass  Imaging: NM PET Image Initial (PI) Skull Base To Thigh  Result Date: 04/28/2022 CLINICAL DATA:  Subsequent treatment strategy for left tonsillar squamous cell carcinoma. Left cervical adenopathy. EXAM: NUCLEAR MEDICINE PET SKULL BASE TO THIGH TECHNIQUE: 9.5 mCi F-18 FDG was injected intravenously. Full-ring PET imaging was performed from the skull base to thigh after the radiotracer. CT data was obtained and used for attenuation correction and anatomic localization. Fasting blood glucose: 119 mg/dl COMPARISON:  CT  neck 16/10/9602 FINDINGS: Mediastinal blood pool activity: SUV max 1.8 Liver activity: SUV max NA NECK: Left tonsillar mass extending into the epiglottis has maximum SUV of 13.9 and hypermetabolic activity measuring 3.6 by 2.5 by 4.8 cm, compatible with malignancy. Bulky conglomerate left level IIa adenopathy measuring 3.4 cm in diameter has a maximum SUV of 13.5. Bilateral masseter and right pterygoid and anterior tongue activity is likely benign. 5.0 by 2.7 cm fatty mass along the superficial fascia margin of the right posterior neck primarily splaying the posterior margin of the sternocleidomastoid muscle, with internal stranding and mild internal nodularity as reported previously. Low-grade associated activity, maximum SUV 1.4. Possibilities include low-grade liposarcoma and atypical lipoma. Incidental CT findings: Postoperative findings in the lower cervical spine. CHEST: No significant abnormal hypermetabolic activity in this region. Incidental CT findings: Mild  biapical pleuroparenchymal scarring. Centrilobular emphysema. ABDOMEN/PELVIS: Hypermetabolic activity along irregular exophytic density in the vicinity of the distal anal canal and perianal region, maximum SUV 8.6, this region of accentuated activity measures 3.5 by 0.9 by 4.6 cm. Exophytic anal mass such as squamous cell carcinoma not excluded. Incidental CT findings: Photopenic cyst in the left hepatic lobe. Atherosclerosis is present, including aortoiliac atherosclerotic disease. SKELETON: No significant abnormal hypermetabolic activity in this region. Incidental CT findings: Mesoacromial os acromiale bilaterally, with degenerative findings on the right side. Severe right degenerative glenohumeral arthropathy with a 2.4 cm free osteochondral fragment in the subscapular recess of the joint and a smaller free fragment in the biceps recess of the joint, with suspected right glenohumeral joint effusion. There is also severe asymmetric degenerative right  hip arthropathy. Degenerative endplate sclerosis at L5-S1. Mid cervical plate and screw fixator noted. IMPRESSION: 1. 3.6 by 2.5 by 4.8 cm left tonsillar mass extending into the epiglottis, maximum SUV 13.9, compatible with malignancy. 2. Bulky conglomerate left level IIA adenopathy is hypermetabolic and compatible with malignancy. 3. 5.0 by 2.7 cm fatty mass along the superficial fascia margin of the right posterior neck, with internal stranding and mild internal nodularity. Low-grade associated activity, maximum SUV 1.4. Possibilities include low-grade liposarcoma and atypical lipoma. 4. Hypermetabolic activity along an exophytic density in the vicinity of the distal anal canal and perianal region, maximum SUV 8.6. Exophytic anal mass such as squamous cell carcinoma not excluded. Visual inspection and digital rectal exam recommended for further assessment. 5. Severe right degenerative glenohumeral arthropathy with free osteochondral fragment in the subscapular recess of the joint and a smaller free fragment in the biceps recess of the joint, with suspected right glenohumeral joint effusion. There is also severe asymmetric degenerative right hip arthropathy. Mesoacromial os acromiale bilaterally, with associated degenerative findings on the right side. 6. Aortic atherosclerosis. 7. Emphysema. Aortic Atherosclerosis (ICD10-I70.0) and Emphysema (ICD10-J43.9). Electronically Signed   By: Gaylyn Rong M.D.   On: 04/28/2022 10:26   DG Chest Port 1 View  Result Date: 04/22/2022 CLINICAL DATA:  Airway compromise EXAM: PORTABLE CHEST 1 VIEW COMPARISON:  05/09/2020 FINDINGS: Normal heart size and mediastinal contours. There is no edema, consolidation, effusion, or pneumothorax. Mild atelectasis at the lung bases. Ossified body in the sub coracoid recess on the right. IMPRESSION: Mild atelectasis at the lung bases. Electronically Signed   By: Tiburcio Pea M.D.   On: 04/22/2022 05:07   CT Soft Tissue Neck W  Contrast  Result Date: 04/21/2022 CLINICAL DATA:  Initial evaluation for head neck cancer. EXAM: CT NECK WITH CONTRAST TECHNIQUE: Multidetector CT imaging of the neck was performed using the standard protocol following the bolus administration of intravenous contrast. RADIATION DOSE REDUCTION: This exam was performed according to the departmental dose-optimization program which includes automated exposure control, adjustment of the mA and/or kV according to patient size and/or use of iterative reconstruction technique. CONTRAST:  75mL OMNIPAQUE IOHEXOL 300 MG/ML  SOLN COMPARISON:  None Available. FINDINGS: Pharynx and larynx: Ill-defined mass centered at the left palatine tonsil is seen. Lesion is difficult to measure given the infiltrative and poorly defined nature of this lesion, but measures approximately 4.0 x 5.1 x 5.4 cm in greatest trans axial dimensions. Lesion appears to cross the midline, abutting and involving the uvula. Anterior extension to involve the floor of mouth and base of tongue. Additional superior extension towards the left nasopharynx. Oropharyngeal airway is narrowed and deviated to the right at the level of the lesion. No retropharyngeal  collection. Epiglottis within normal limits. Remainder of the hypopharynx and supraglottic larynx within normal limits. Negative glottis. Subglottic airway clear. Salivary glands: Salivary glands including the parotid and submandibular glands are within normal limits. Thyroid: Normal. Lymph nodes: Extensive abnormal bulky adenopathy seen at left level 2, with largest nodal conglomerate measuring 3.7 x 3.8 cm (series 2, image 46). Scattered areas of internal hypodensity consistent with necrosis. Mildly prominent left level 1 B nodes measure up to 1 cm, also potentially involved (series 2, image 54). No right-sided adenopathy. Vascular: Normal intravascular enhancement seen throughout the neck. Mild atheromatous change about the carotid bifurcations.  Strongly dominant right vertebral artery noted. Left IJ is compressed at the level of the left cervical adenopathy. Limited intracranial: Unremarkable. Visualized orbits: Unremarkable. Mastoids and visualized paranasal sinuses: Visualized paranasal sinuses are clear. Mastoid air cells and middle ear cavities are well pneumatized and free of fluid. Skeleton: No discrete or worrisome osseous lesions. Prior ACDF at C3 through C5. Advanced spondylosis present at C5-6 and C6-7 with prominent anterior endplate osteophytic spurring. Upper chest: Mild emphysematous changes.  No acute findings. Other: Heterogeneous fat density mass measuring up to 5 cm present at the right posterolateral neck (series 2, image 41). While this could reflect a benign lipoma, internal heterogeneity is greater than is typically seen. IMPRESSION: 1. Approximate 4.0 x 5.1 x 5.4 cm mass centered at the left palatine tonsil, consistent with primary head and neck malignancy. 2. Abnormal bulky left level 2 adenopathy, consistent with nodal metastases. Additional prominent left level 1b nodes may be involved as well. 3. 5 cm fat density mass at the right posterolateral neck, indeterminate. While this could reflect a benign lipoma, internal heterogeneity is greater than is typically seen. Correlation with physical exam recommended. Further evaluation dedicated nonemergent MRI could be performed for further evaluation as warranted. 4. Prior ACDF at C3 through C5 with advanced spondylosis at C5-6 and C6-7. Emphysema (ICD10-J43.9). Electronically Signed   By: Rise Mu M.D.   On: 04/21/2022 21:22    Labs:  CBC: Recent Labs    04/21/22 1957 04/22/22 0741 05/15/22 0916 05/18/22 1344  WBC 8.5 5.3 10.5 6.1  HGB 10.2* 11.2* 11.9* 10.3*  HCT 31.6* 33.3* 35.2* 30.7*  PLT 418* 413* 217 297    COAGS: No results for input(s): "INR", "APTT" in the last 8760 hours.  BMP: Recent Labs    04/21/22 1957 04/22/22 0741 05/15/22 0916  05/18/22 1344  NA 127* 130* 131* 130*  K 4.0 4.4 4.1 3.8  CL 94* 94* 95* 94*  CO2 24 26 29 29   GLUCOSE 108* 123* 105* 80  BUN 16 11 14 8   CALCIUM 8.8* 9.2 8.9 8.6*  CREATININE 1.10 0.83 0.79 0.66  GFRNONAA >60 >60 >60 >60    LIVER FUNCTION TESTS: Recent Labs    04/21/22 1957  BILITOT 0.6  AST 31  ALT 21  ALKPHOS 44  PROT 7.4  ALBUMIN 3.9    TUMOR MARKERS: No results for input(s): "AFPTM", "CEA", "CA199", "CHROMGRNA" in the last 8760 hours.  Assessment and Plan: 62 y.o. 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 He is scheduled today for port a cath placement to assist with treatment.Risks and benefits of image guided port-a-catheter placement was discussed with the patient including, but not limited to bleeding, infection, pneumothorax, or fibrin sheath development and need for  additional procedures.  All of the patient's questions were answered, patient is agreeable to proceed. Consent signed and in chart.    Thank you for this interesting consult.  I greatly enjoyed meeting [facility] and look forward to participating in their care.  A copy of this report was sent to the requesting provider on this date.  Electronically Signed: D. Jeananne Rama, PA-C 05/18/2022, 3:18 PM   I spent a total of   25 minutes  in face to face in clinical consultation, greater than 50% of which was counseling/coordinating care for port a cath placement

## 2022-05-18 NOTE — Progress Notes (Signed)
This RN received V/O from Dr. Al Pimple to infuse IVFs over an hour and half at a rate of 750 mL/hr today.

## 2022-05-18 NOTE — Assessment & Plan Note (Signed)
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  Stage III squamous cell carcinoma: His left sided neck mass appears smaller than it did just a few days ago.  This is great sign of early clinical response.  The oxycodone is helping his pain.  I will send in a refill for this since I gave him a limited amount earlier this week as I was unsure it would help since the hydrocodone did not help.   Dehydration: He will receive another liter of normal saline today.  This has been helping him feel better. Severe protein calorie malnutrition: Is following up with nutrition.  Previously had 11 pound weight loss.  He will be reweighed next week.  He will return next week for labs, follow-up, and his first chemo.  He has his port placement tomorrow.  He will continue daily radiation.

## 2022-05-18 NOTE — Progress Notes (Signed)
[facility] Cancer Follow up:    Pcp, No No address on file   DIAGNOSIS:  Cancer Staging  Cancer of tonsillar fossa (HCC) Staging form: Pharynx - HPV-Mediated Oropharynx, AJCC 8th Edition - Clinical stage from 04/25/2022: Stage III (cT3, cN3, cM0, p16+) - Unsigned Stage prefix: Initial diagnosis   SUMMARY OF ONCOLOGIC HISTORY: Oncology History  Cancer of tonsillar fossa (HCC)  02/2022 Initial Diagnosis   The patient initially presented to a provider located in [place] this past February for evaluation of a new right neck mass and sore throat. He was subsequently referred to ENT in [place] and had a soft tissue neck CT performed on 03/03/22 which demonstrated a large left neck mass and a tonsillar mass. Involvement of the left soft palate and possible invasion of the left tongue base were also appreciated. The scanned report is partially unreadable due to low quality, however findings detailed a a 6.1 cm mass-like area of matted left level 2 and level 3 lymph nodes with areas of necrosis, and a 6.2 cm fatty neoplasm posterior to the right sternocleidomastoid muscle.    03/15/2022 Initial Biopsy   Biopsy of the left neck mass: metastatic squamous cell carcinoma; p16 positive.    04/28/2022 PET scan   IMPRESSION: 1. 3.6 by 2.5 by 4.8 cm left tonsillar mass extending into the epiglottis, maximum SUV 13.9, compatible with malignancy. 2. Bulky conglomerate left level IIA adenopathy is hypermetabolic and compatible with malignancy. 3. 5.0 by 2.7 cm fatty mass along the superficial fascia margin of the right posterior neck, with internal stranding and mild internal nodularity. Low-grade associated activity, maximum SUV 1.4. Possibilities include low-grade liposarcoma and atypical lipoma. 4. Hypermetabolic activity along an exophytic density in the vicinity of the distal anal canal and perianal region, maximum SUV 8.6. Exophytic anal mass such as squamous cell carcinoma  not excluded. Visual inspection and digital rectal exam recommended for further assessment. 5. Severe right degenerative glenohumeral arthropathy with free osteochondral fragment in the subscapular recess of the joint and a smaller free fragment in the biceps recess of the joint, with suspected right glenohumeral joint effusion. There is also severe asymmetric degenerative right hip arthropathy. Mesoacromial os acromiale bilaterally, with associated degenerative findings on the right side. 6. Aortic atherosclerosis. 7. Emphysema.   Aortic Atherosclerosis (ICD10-I70.0) and Emphysema (ICD10-J43.9).     Electronically Signed   By: Gaylyn Rong M.D.   On: 04/28/2022 10:26   05/25/2022 -  Chemotherapy   Patient is on Treatment Plan : HEAD/NECK Cisplatin (40) q7d       CURRENT THERAPY: radiation, to begin concurrent chemoradiation with cisplatin next week.   INTERVAL HISTORY: [name] 62 y.o. male returns for follow-up of his head neck cancer.  I saw him earlier this week and he was not doing well.  He tells me that he has been feeling better since beginning radiation and also since receiving IV fluids.  He tells me that the oxycodone brings his pain from a 10 to a 7.  It is working well to control his pain much better than the hydrocodone was.  He has stopped taking the hydrocodone altogether.   Patient Active Problem List   Diagnosis Date Noted   Cancer of tonsillar fossa (HCC) 04/25/2022   SCC (squamous cell carcinoma) 04/24/2022   Normocytic anemia 04/24/2022   Chronic back pain 02/24/2020   Ileus (HCC) 03/24/2019   MDD (major depressive disorder), recurrent episode (HCC) 04/20/2018   Chronic right-sided low back pain 04/16/2018  Weakness of right lower extremity 04/16/2018   Healthcare maintenance 02/04/2018   Blood loss anemia 01/28/2018   Rectal bleeding 01/28/2018   Alcohol dependence with alcohol-induced mood disorder (HCC)    Major depressive disorder,  recurrent severe without psychotic features (HCC) 08/01/2017   Dysphagia 07/08/2017   Stenosis of cervical spine with myelopathy (HCC) 07/06/2017   Left-sided weakness 06/27/2017   Tricompartment osteoarthritis of right knee 03/27/2017   Alcohol abuse with alcohol-induced mood disorder (HCC) 09/25/2016   Polysubstance dependence (HCC) 09/25/2016   Substance use disorder 09/19/2016   MDD (major depressive disorder), recurrent severe, without psychosis (HCC) 09/18/2016   Cocaine abuse (HCC) 07/27/2015   Suicidal ideation 07/27/2015   Cocaine dependence with intoxication with complication (HCC) 07/23/2015   Feeling like committing suicide 07/23/2015   Metabolic acidosis 07/23/2015   Tobacco abuse 07/23/2015   Substance induced mood disorder (HCC) 07/19/2015   Bipolar 1 disorder, depressed (HCC) 07/18/2015   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   Hemorrhoids    HIV (human immunodeficiency virus infection) (HCC)    Stenosis of cervical spine    withb myelopathy    SURGICAL HISTORY: Past Surgical History:  Procedure Laterality Date   ANTERIOR CERVICAL DECOMP/DISCECTOMY FUSION N/A 07/06/2017   Procedure: ANTERIOR CERVICAL DECOMPRESSION/DISCECTOMY FUSION C3-4/C4-5 2 LEVELS;  Surgeon: Lisbeth Renshaw, MD;  Location: [facility] OR;  Service: Neurosurgery;  Laterality: N/A;   HERNIA REPAIR  97   lt ing   RECTAL EXAM UNDER ANESTHESIA N/A 04/24/2014   Procedure:  ligation of bleeding vessels;  Surgeon: Almond Lint, MD;  Location: [facility];  Service: General;  Laterality: N/A;   TENDON REPAIR     2006-lt index   WART FULGURATION  07/06/2011   Procedure: FULGURATION ANAL WART;  Surgeon: Shelly Rubenstein, MD;  Location: [facility];  Service: General;  Laterality: N/A;  excision anal condyloma    WART FULGURATION N/A 04/23/2014   Procedure: EXCISION OF ANAL CONDYLOMA;  Surgeon: Abigail Miyamoto, MD;  Location: [facility];  Service: General;  Laterality: N/A;    SOCIAL HISTORY: Social History   Socioeconomic History   Marital status: Single    Spouse name: Not on file   Number of children: Not on file   Years of education: Not on file   Highest education level: Not on file  Occupational History   Occupation: Unemployed  Tobacco Use   Smoking status: Every Day    Packs/day: 0.50    Years: 25.00    Additional pack years: 0.00    Total pack years: 12.50    Types: Cigarettes   Smokeless tobacco: Never  Vaping Use   Vaping Use: Never used  Substance and Sexual Activity   Alcohol use: Yes    Alcohol/week: 24.0 standard drinks of alcohol    Types: 24 Cans of beer per week    Comment: ''I can drink a case a day''   Drug use: Yes    Types: "Crack" cocaine    Comment: pt reports last use was Wednesday   Sexual activity: Not Currently    Comment: pt declined condoms 09/22/19  Other Topics Concern   Not on file  Social History Narrative   Pt lives alone; receives [employer] surgeon.   Social Determinants of Health   Financial Resource Strain: Not on file  Food Insecurity: Not on file  Transportation Needs: Not on file  Physical Activity: Not on file  Stress: Not on file  Social Connections: Not on file  Intimate Partner Violence: Not on file    FAMILY HISTORY: Family History  Problem Relation Age of Onset   Cancer Mother        brain   Cancer Sister        breast    Review of Systems  Constitutional:  Negative for appetite change, chills, fatigue, fever and unexpected weight change.  HENT:   Negative for hearing loss, lump/mass and trouble swallowing.   Eyes:  Negative for eye problems and icterus.  Respiratory:  Negative for chest tightness, cough and shortness of breath.   Cardiovascular:  Negative for chest pain, leg swelling and palpitations.   Gastrointestinal:  Negative for abdominal distention, abdominal pain, constipation, diarrhea, nausea and vomiting.  Endocrine: Negative for hot flashes.  Genitourinary:  Negative for difficulty urinating.   Musculoskeletal:  Negative for arthralgias.  Skin:  Negative for itching and rash.  Neurological:  Negative for dizziness, extremity weakness, headaches and numbness.  Hematological:  Negative for adenopathy. Does not bruise/bleed easily.  Psychiatric/Behavioral:  Negative for depression. The patient is not nervous/anxious.       PHYSICAL EXAMINATION  Vitals were completed in the infusion room.  LABORATORY DATA:  CBC    Component Value Date/Time   WBC 6.1 05/18/2022 1344   WBC 5.3 04/22/2022 0741   RBC 3.69 (L) 05/18/2022 1344   HGB 10.3 (L) 05/18/2022 1344   HCT 30.7 (L) 05/18/2022 1344   PLT 297 05/18/2022 1344   MCV 83.2 05/18/2022 1344   MCH 27.9 05/18/2022 1344   MCHC 33.6 05/18/2022 1344   RDW 15.5 05/18/2022 1344   LYMPHSABS 0.5 (L) 05/18/2022 1344   MONOABS 0.7 05/18/2022 1344   EOSABS 0.1 05/18/2022 1344   BASOSABS 0.0 05/18/2022 1344    CMP     Component Value Date/Time   NA 130 (L) 05/18/2022 1344   K 3.8 05/18/2022 1344   CL 94 (L) 05/18/2022 1344   CO2 29 05/18/2022 1344   GLUCOSE 80 05/18/2022 1344   BUN 8 05/18/2022 1344   CREATININE 0.66 05/18/2022 1344   CREATININE 1.07 03/01/2021 1649   CALCIUM 8.6 (L) 05/18/2022 1344   PROT 7.4 04/21/2022 1957   ALBUMIN 3.9 04/21/2022 1957   AST 31 04/21/2022 1957   ALT 21 04/21/2022 1957   ALKPHOS 44 04/21/2022 1957   BILITOT 0.6 04/21/2022 1957   GFRNONAA >60 05/18/2022 1344   GFRNONAA 86 09/22/2019 1121   GFRAA 99 09/22/2019 1121     ASSESSMENT and THERAPY PLAN:   Cancer of tonsillar fossa (HCC) [name] is a 62 year old man with 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  Stage III squamous cell carcinoma: His  left sided neck mass appears smaller than it did just a few days ago.  This is great sign of early clinical response.  The oxycodone is helping his pain.  I will send in a refill for this since I gave him a limited amount earlier this week as I was unsure it would help since the hydrocodone did not help.   Dehydration: He will receive another liter of normal saline today.  This has been helping him feel better. Severe protein calorie malnutrition: Is following up with nutrition.  Previously had 11 pound weight loss.  He will be reweighed next  week.  He will return next week for labs, follow-up, and his first chemo.  He has his port placement tomorrow.  He will continue daily radiation.  All questions were answered. The patient knows to call the clinic with any problems, questions or concerns. We can certainly see the patient much sooner if necessary.  Total encounter time:20 minutes*in face-to-face visit time, chart review, lab review, care coordination, order entry, and documentation of the encounter time.  Lillard Anes, NP 05/18/22 3:49 PM [facility] [address] [place], [place] [address] Tel. [phone]    Fax. [phone]  *Total Encounter Time as defined by the Centers for Medicare and Medicaid Services includes, in addition to the face-to-face time of a patient visit (documented in the note above) non-face-to-face time: obtaining and reviewing outside history, ordering and reviewing medications, tests or procedures, care coordination (communications with other health care professionals or caregivers) and documentation in the medical record.

## 2022-05-19 ENCOUNTER — Other Ambulatory Visit: Payer: Self-pay

## 2022-05-19 ENCOUNTER — Ambulatory Visit (HOSPITAL_COMMUNITY)
Admission: RE | Admit: 2022-05-19 | Discharge: 2022-05-19 | Disposition: A | Discharge status: Home / Self Care | Payer: Medicare Other | Source: Ambulatory Visit | Attending: Hematology and Oncology | Admitting: Hematology and Oncology

## 2022-05-19 ENCOUNTER — Ambulatory Visit: Payer: Medicare Other

## 2022-05-19 ENCOUNTER — Encounter (HOSPITAL_COMMUNITY): Payer: Self-pay

## 2022-05-19 DIAGNOSIS — C090 Malignant neoplasm of tonsillar fossa: Secondary | ICD-10-CM | POA: Diagnosis present

## 2022-05-19 DIAGNOSIS — F17210 Nicotine dependence, cigarettes, uncomplicated: Secondary | ICD-10-CM | POA: Diagnosis not present

## 2022-05-19 DIAGNOSIS — Z21 Asymptomatic human immunodeficiency virus [HIV] infection status: Secondary | ICD-10-CM | POA: Diagnosis not present

## 2022-05-19 DIAGNOSIS — Z803 Family history of malignant neoplasm of breast: Secondary | ICD-10-CM | POA: Insufficient documentation

## 2022-05-19 DIAGNOSIS — Z808 Family history of malignant neoplasm of other organs or systems: Secondary | ICD-10-CM | POA: Insufficient documentation

## 2022-05-19 HISTORY — PX: IR IMAGING GUIDED PORT INSERTION: IMG5740

## 2022-05-19 MED ORDER — HEPARIN SOD (PORK) LOCK FLUSH 100 UNIT/ML IV SOLN
INTRAVENOUS | Status: AC
  Filled 2022-05-19: qty 5

## 2022-05-19 MED ORDER — FENTANYL CITRATE (PF) 100 MCG/2ML IJ SOLN
INTRAMUSCULAR | Status: AC
  Filled 2022-05-19: qty 2

## 2022-05-19 MED ORDER — FENTANYL CITRATE (PF) 100 MCG/2ML IJ SOLN
INTRAMUSCULAR | Status: AC | PRN
  Administered 2022-05-19 (×3): 25 ug via INTRAVENOUS

## 2022-05-19 MED ORDER — MIDAZOLAM HCL 2 MG/2ML IJ SOLN
INTRAMUSCULAR | Status: AC | PRN
  Administered 2022-05-19 (×3): .5 mg via INTRAVENOUS

## 2022-05-19 MED ORDER — HEPARIN SOD (PORK) LOCK FLUSH 100 UNIT/ML IV SOLN
500.0000 [IU] | Freq: Once | INTRAVENOUS | Status: AC
  Administered 2022-05-19: 500 [IU]

## 2022-05-19 MED ORDER — LIDOCAINE-EPINEPHRINE 1 %-1:100000 IJ SOLN
INTRAMUSCULAR | Status: AC
  Filled 2022-05-19: qty 1

## 2022-05-19 MED ORDER — LIDOCAINE-EPINEPHRINE 1 %-1:100000 IJ SOLN
20.0000 mL | Freq: Once | INTRAMUSCULAR | Status: AC
  Administered 2022-05-19: 20 mL

## 2022-05-19 MED ORDER — HEPARIN SOD (PORK) LOCK FLUSH 100 UNIT/ML IV SOLN
INTRAVENOUS | Status: AC | PRN
  Administered 2022-05-19: 500 [IU] via INTRAVENOUS

## 2022-05-19 MED ORDER — SODIUM CHLORIDE 0.9 % IV SOLN: INTRAVENOUS | Status: DC

## 2022-05-19 MED ORDER — MIDAZOLAM HCL 2 MG/2ML IJ SOLN
INTRAMUSCULAR | Status: AC
  Filled 2022-05-19: qty 2

## 2022-05-19 NOTE — Procedures (Signed)
Interventional Radiology Procedure Note  Procedure: RT IJ POWER PORT    Complications: None  Estimated Blood Loss:  MIN  Findings: TIP SVCRA    M. TREVOR [name] Beckner, MD    

## 2022-05-19 NOTE — Discharge Instructions (Signed)
Discharge Instructions:   Please call Interventional Radiology clinic [phone] with any questions or concerns.  You may remove your dressing and shower tomorrow.  Do not use EMLA / Lidocaine cream for 2 weeks post Port Insertion for this will remove the surgical glue.  Implanted Port Insertion, Care After The following information offers guidance on how to care for yourself after your procedure. Your health care provider may also give you more specific instructions. If you have problems or questions, contact your health care provider. What can I expect after the procedure? After the procedure, it is common to have: Discomfort at the port insertion site. Bruising on the skin over the port. This should improve over 3-4 days. Follow these instructions at home: [facility] care After your port is placed, you will get a manufacturer's information card. The card has information about your port. Keep this card with you at all times. Take care of the port as told by your health care provider. Ask your health care provider if you or a family member can get training for taking care of the port at home. A home health care nurse will be be available to help care for the port. Make sure to remember what type of port you have. Incision care     Follow instructions from your health care provider about how to take care of your port insertion site. Make sure you: Wash your hands with soap and water for at least 20 seconds before and after you change your bandage (dressing). If soap and water are not available, use hand sanitizer. Change your dressing as told by your health care provider. Leave stitches (sutures), skin glue, or adhesive strips in place. These skin closures may need to stay in place for 2 weeks or longer. If adhesive strip edges start to loosen and curl up, you may trim the loose edges. Do not remove adhesive strips completely unless your health care provider tells you to do that. Check your  port insertion site every day for signs of infection. Check for: Redness, swelling, or pain. Fluid or blood. Warmth. Pus or a bad smell. Activity Return to your normal activities as told by your health care provider. Ask your health care provider what activities are safe for you. You may have to avoid lifting. Ask your health care provider how much you can safely lift. General instructions Take over-the-counter and prescription medicines only as told by your health care provider. Do not take baths, swim, or use a hot tub until your health care provider approves. Ask your health care provider if you may take showers. You may only be allowed to take sponge baths. If you were given a sedative during the procedure, it can affect you for several hours. Do not drive or operate machinery until your health care provider says that it is safe. Wear a medical alert bracelet in case of an emergency. This will tell any health care providers that you have a port. Keep all follow-up visits. This is important. Contact a health care provider if: You cannot flush your port with saline as directed, or you cannot draw blood from the port. You have a fever or chills. You have redness, swelling, or pain around your port insertion site. You have fluid or blood coming from your port insertion site. Your port insertion site feels warm to the touch. You have pus or a bad smell coming from the port insertion site. Get help right away if: You have chest pain or shortness of  breath. You have bleeding from your port that you cannot control. These symptoms may be an emergency. Get help right away. Call 911. Do not wait to see if the symptoms will go away. Do not drive yourself to the hospital. Summary Take care of the port as told by your health care provider. Keep the manufacturer's information card with you at all times. Change your dressing as told by your health care provider. Contact a health care provider if  you have a fever or chills or if you have redness, swelling, or pain around your port insertion site. Keep all follow-up visits. This information is not intended to replace advice given to you by your health care provider. Make sure you discuss any questions you have with your health care provider. Document Revised: 06/29/2020 Document Reviewed: 06/29/2020 Elsevier Patient Education  2023 Elsevier Inc.   Moderate Conscious Sedation, Adult, Care After This sheet gives you information about how to care for yourself after your procedure. Your health care provider may also give you more specific instructions. If you have problems or questions, contact your health care provider. What can I expect after the procedure? After the procedure, it is common to have: Sleepiness for several hours. Impaired judgment for several hours. Difficulty with balance. Vomiting if you eat too soon. Follow these instructions at home: For the time period you were told by your health care provider: Rest. Do not participate in activities where you could fall or become injured. Do not drive or use machinery. Do not drink alcohol. Do not take sleeping pills or medicines that cause drowsiness. Do not make important decisions or sign legal documents. Do not take care of children on your own. Eating and drinking  Follow the diet recommended by your health care provider. Drink enough fluid to keep your urine pale yellow. If you vomit: Drink water, juice, or soup when you can drink without vomiting. Make sure you have little or no nausea before eating solid foods. General instructions Take over-the-counter and prescription medicines only as told by your health care provider. Have a responsible adult stay with you for the time you are told. It is important to have someone help care for you until you are awake and alert. Do not smoke. Keep all follow-up visits as told by your health care provider. This is  important. Contact a health care provider if: You are still sleepy or having trouble with balance after 24 hours. You feel light-headed. You keep feeling nauseous or you keep vomiting. You develop a rash. You have a fever. You have redness or swelling around the IV site. Get help right away if: You have trouble breathing. You have new-onset confusion at home. Summary After the procedure, it is common to feel sleepy, have impaired judgment, or feel nauseous if you eat too soon. Rest after you get home. Know the things you should not do after the procedure. Follow the diet recommended by your health care provider and drink enough fluid to keep your urine pale yellow. Get help right away if you have trouble breathing or new-onset confusion at home. This information is not intended to replace advice given to you by your health care provider. Make sure you discuss any questions you have with your health care provider. Document Revised: 04/25/2019 Document Reviewed: 11/21/2018 Elsevier Patient Education  2023 [employer].

## 2022-05-21 ENCOUNTER — Other Ambulatory Visit: Payer: Self-pay

## 2022-05-22 ENCOUNTER — Inpatient Hospital Stay: Payer: Medicare Other

## 2022-05-22 ENCOUNTER — Ambulatory Visit: Payer: Medicare Other

## 2022-05-22 ENCOUNTER — Telehealth: Payer: Self-pay | Admitting: Radiation Oncology

## 2022-05-22 NOTE — Telephone Encounter (Signed)
5/13 @ 11:51 am Patient call to cancel treatment appt for today, due to not feeling well and to weak to come to his appointment.  Patient stated will be here tomorrow.  Called [facility] machine no answer; email has been sent to [facility] machine copied Zerita Boers so they are aware.

## 2022-05-23 ENCOUNTER — Other Ambulatory Visit: Payer: Self-pay

## 2022-05-23 ENCOUNTER — Telehealth: Payer: Self-pay | Admitting: *Deleted

## 2022-05-23 ENCOUNTER — Ambulatory Visit
Admission: RE | Admit: 2022-05-23 | Discharge: 2022-05-23 | Disposition: A | Discharge status: Home / Self Care | Payer: Medicare Other | Source: Ambulatory Visit | Attending: Radiation Oncology | Admitting: Radiation Oncology

## 2022-05-23 ENCOUNTER — Ambulatory Visit: Payer: Medicare Other

## 2022-05-23 ENCOUNTER — Other Ambulatory Visit: Payer: Self-pay | Admitting: Radiation Oncology

## 2022-05-23 DIAGNOSIS — Z510 Encounter for antineoplastic radiation therapy: Secondary | ICD-10-CM | POA: Diagnosis not present

## 2022-05-23 DIAGNOSIS — F319 Bipolar disorder, unspecified: Secondary | ICD-10-CM

## 2022-05-23 DIAGNOSIS — F332 Major depressive disorder, recurrent severe without psychotic features: Secondary | ICD-10-CM

## 2022-05-23 DIAGNOSIS — C090 Malignant neoplasm of tonsillar fossa: Secondary | ICD-10-CM

## 2022-05-23 DIAGNOSIS — F339 Major depressive disorder, recurrent, unspecified: Secondary | ICD-10-CM

## 2022-05-23 LAB — RAD ONC ARIA SESSION SUMMARY
Course Elapsed Days: 15
Plan Fractions Treated to Date: 8
Plan Prescribed Dose Per Fraction: 2 Gy
Plan Total Fractions Prescribed: 35
Plan Total Prescribed Dose: 70 Gy
Reference Point Dosage Given to Date: 16 Gy
Reference Point Session Dosage Given: 2 Gy
Session Number: 8

## 2022-05-23 MED ORDER — GABAPENTIN 800 MG PO TABS
800.0000 mg | ORAL_TABLET | Freq: Three times a day (TID) | ORAL | 0 refills | Status: DC
Start: 2022-05-23 — End: 2022-08-03

## 2022-05-23 MED ORDER — BUPROPION HCL ER (XL) 300 MG PO TB24
300.0000 mg | ORAL_TABLET | Freq: Every day | ORAL | 0 refills | Status: DC
Start: 2022-05-23 — End: 2022-06-06

## 2022-05-23 MED ORDER — FLUCONAZOLE 100 MG PO TABS
ORAL_TABLET | ORAL | 0 refills | Status: DC
Start: 2022-05-23 — End: 2022-06-09

## 2022-05-23 NOTE — Telephone Encounter (Signed)
This RN called pt today to discuss upcoming infusion and education - pt states he would like to obtain handout information morning of treatment (5/16).  This RN discussed primary need presently is for him to hydrate aggressively tomorrow after his radiation treatment due to need for labs and urine measuring during his treatment.  Jabin stated understanding as well as ability to hydrate well.  This RN gave positive affirmation regarding pending treatment and that our goal is to get him strong and healthy - and that we will be able to support him thru this treatment.  No further needs at this time.

## 2022-05-24 ENCOUNTER — Ambulatory Visit
Admission: RE | Admit: 2022-05-24 | Discharge: 2022-05-24 | Disposition: A | Discharge status: Home / Self Care | Payer: Medicare Other | Source: Ambulatory Visit | Attending: Radiation Oncology | Admitting: Radiation Oncology

## 2022-05-24 ENCOUNTER — Ambulatory Visit: Payer: Medicare Other

## 2022-05-24 ENCOUNTER — Other Ambulatory Visit: Payer: Self-pay

## 2022-05-24 DIAGNOSIS — Z510 Encounter for antineoplastic radiation therapy: Secondary | ICD-10-CM | POA: Diagnosis not present

## 2022-05-24 LAB — RAD ONC ARIA SESSION SUMMARY
Course Elapsed Days: 16
Plan Fractions Treated to Date: 9
Plan Prescribed Dose Per Fraction: 2 Gy
Plan Total Fractions Prescribed: 35
Plan Total Prescribed Dose: 70 Gy
Reference Point Dosage Given to Date: 18 Gy
Reference Point Session Dosage Given: 2 Gy
Session Number: 9

## 2022-05-24 MED FILL — Dexamethasone Sodium Phosphate Inj 100 MG/10ML: INTRAMUSCULAR | Qty: 1 | Status: AC

## 2022-05-24 NOTE — Progress Notes (Deleted)
[facility] Cancer Follow up:    Pcp, No No address on file   DIAGNOSIS: Cancer Staging  Cancer of tonsillar fossa (HCC) Staging form: Pharynx - HPV-Mediated Oropharynx, AJCC 8th Edition - Clinical stage from 04/25/2022: Stage III (cT3, cN3, cM0, p16+) - Unsigned Stage prefix: Initial diagnosis   SUMMARY OF ONCOLOGIC HISTORY: Oncology History  Cancer of tonsillar fossa (HCC)  02/2022 Initial Diagnosis   The patient initially presented to a provider located in [place] this past February for evaluation of a new right neck mass and sore throat. He was subsequently referred to ENT in [place] and had a soft tissue neck CT performed on 03/03/22 which demonstrated a large left neck mass and a tonsillar mass. Involvement of the left soft palate and possible invasion of the left tongue base were also appreciated. The scanned report is partially unreadable due to low quality, however findings detailed a a 6.1 cm mass-like area of matted left level 2 and level 3 lymph nodes with areas of necrosis, and a 6.2 cm fatty neoplasm posterior to the right sternocleidomastoid muscle.    03/15/2022 Initial Biopsy   Biopsy of the left neck mass: metastatic squamous cell carcinoma; p16 positive.    04/28/2022 PET scan   IMPRESSION: 1. 3.6 by 2.5 by 4.8 cm left tonsillar mass extending into the epiglottis, maximum SUV 13.9, compatible with malignancy. 2. Bulky conglomerate left level IIA adenopathy is hypermetabolic and compatible with malignancy. 3. 5.0 by 2.7 cm fatty mass along the superficial fascia margin of the right posterior neck, with internal stranding and mild internal nodularity. Low-grade associated activity, maximum SUV 1.4. Possibilities include low-grade liposarcoma and atypical lipoma. 4. Hypermetabolic activity along an exophytic density in the vicinity of the distal anal canal and perianal region, maximum SUV 8.6. Exophytic anal mass such as squamous cell carcinoma  not excluded. Visual inspection and digital rectal exam recommended for further assessment. 5. Severe right degenerative glenohumeral arthropathy with free osteochondral fragment in the subscapular recess of the joint and a smaller free fragment in the biceps recess of the joint, with suspected right glenohumeral joint effusion. There is also severe asymmetric degenerative right hip arthropathy. Mesoacromial os acromiale bilaterally, with associated degenerative findings on the right side. 6. Aortic atherosclerosis. 7. Emphysema.   Aortic Atherosclerosis (ICD10-I70.0) and Emphysema (ICD10-J43.9).     Electronically Signed   By: Gaylyn Rong M.D.   On: 04/28/2022 10:26   05/25/2022 -  Chemotherapy   Patient is on Treatment Plan : HEAD/NECK Cisplatin (40) q7d       CURRENT THERAPY:  INTERVAL HISTORY: [name] 62 y.o. male returns for    Patient Active Problem List   Diagnosis Date Noted   Cancer of tonsillar fossa (HCC) 04/25/2022   SCC (squamous cell carcinoma) 04/24/2022   Normocytic anemia 04/24/2022   Chronic back pain 02/24/2020   Ileus (HCC) 03/24/2019   MDD (major depressive disorder), recurrent episode (HCC) 04/20/2018   Chronic right-sided low back pain 04/16/2018   Weakness of right lower extremity 04/16/2018   Healthcare maintenance 02/04/2018   Blood loss anemia 01/28/2018   Rectal bleeding 01/28/2018   Alcohol dependence with alcohol-induced mood disorder (HCC)    Major depressive disorder, recurrent severe without psychotic features (HCC) 08/01/2017   Dysphagia 07/08/2017   Stenosis of cervical spine with myelopathy (HCC) 07/06/2017   Left-sided weakness 06/27/2017   Tricompartment osteoarthritis of right knee 03/27/2017   Alcohol abuse with alcohol-induced mood disorder (HCC) 09/25/2016   Polysubstance dependence (  HCC) 09/25/2016   Substance use disorder 09/19/2016   MDD (major depressive disorder), recurrent severe, without psychosis (HCC)  09/18/2016   Cocaine abuse (HCC) 07/27/2015   Suicidal ideation 07/27/2015   Cocaine dependence with intoxication with complication (HCC) 07/23/2015   Feeling like committing suicide 07/23/2015   Metabolic acidosis 07/23/2015   Tobacco abuse 07/23/2015   Substance induced mood disorder (HCC) 07/19/2015   Bipolar 1 disorder, depressed (HCC) 07/18/2015   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   Hemorrhoids    HIV (human immunodeficiency virus infection) (HCC)    Stenosis of cervical spine    withb myelopathy    SURGICAL HISTORY: Past Surgical History:  Procedure Laterality Date   ANTERIOR CERVICAL DECOMP/DISCECTOMY FUSION N/A 07/06/2017   Procedure: ANTERIOR CERVICAL DECOMPRESSION/DISCECTOMY FUSION C3-4/C4-5 2 LEVELS;  Surgeon: Lisbeth Renshaw, MD;  Location: [facility] OR;  Service: Neurosurgery;  Laterality: N/A;   HERNIA REPAIR  97   lt ing   IR IMAGING GUIDED PORT INSERTION  05/19/2022   RECTAL EXAM UNDER ANESTHESIA N/A 04/24/2014   Procedure:  ligation of bleeding vessels;  Surgeon: Almond Lint, MD;  Location: [facility];  Service: General;  Laterality: N/A;   TENDON REPAIR     2006-lt index   WART FULGURATION  07/06/2011   Procedure: FULGURATION ANAL WART;  Surgeon: Shelly Rubenstein, MD;  Location: [facility];  Service: General;  Laterality: N/A;  excision anal condyloma   WART FULGURATION N/A 04/23/2014   Procedure: EXCISION OF ANAL CONDYLOMA;  Surgeon: Abigail Miyamoto, MD;  Location: [facility];  Service: General;  Laterality: N/A;    SOCIAL HISTORY: Social History   Socioeconomic History   Marital status: Single    Spouse name: Not on file   Number of children: Not on file   Years of education: Not on file   Highest education level: Not on file  Occupational History    Occupation: Unemployed  Tobacco Use   Smoking status: Every Day    Packs/day: 0.50    Years: 25.00    Additional pack years: 0.00    Total pack years: 12.50    Types: Cigarettes   Smokeless tobacco: Never  Vaping Use   Vaping Use: Never used  Substance and Sexual Activity   Alcohol use: Yes    Alcohol/week: 24.0 standard drinks of alcohol    Types: 24 Cans of beer per week    Comment: ''I can drink a case a day''   Drug use: Yes    Types: "Crack" cocaine    Comment: pt reports last use was Wednesday   Sexual activity: Not Currently    Comment: pt declined condoms 09/22/19  Other Topics Concern   Not on file  Social History Narrative   Pt lives alone; receives Tree surgeon.   Social Determinants of Health   Financial Resource Strain: Not on file  Food Insecurity: Not on file  Transportation Needs: Not on file  Physical Activity: Not on file  Stress: Not on file  Social Connections: Not on file  Intimate Partner Violence: Not on file    FAMILY HISTORY: Family History  Problem Relation Age of Onset   Cancer Mother        brain   Cancer Sister        breast    Review  of Systems  Constitutional:  Negative for appetite change, chills, fatigue, fever and unexpected weight change.  HENT:   Negative for hearing loss, lump/mass and trouble swallowing.   Eyes:  Negative for eye problems and icterus.  Respiratory:  Negative for chest tightness, cough and shortness of breath.   Cardiovascular:  Negative for chest pain, leg swelling and palpitations.  Gastrointestinal:  Negative for abdominal distention, abdominal pain, constipation, diarrhea, nausea and vomiting.  Endocrine: Negative for hot flashes.  Genitourinary:  Negative for difficulty urinating.   Musculoskeletal:  Negative for arthralgias.  Skin:  Negative for itching and rash.  Neurological:  Negative for dizziness, extremity weakness, headaches and numbness.  Hematological:  Negative for adenopathy. Does not  bruise/bleed easily.  Psychiatric/Behavioral:  Negative for depression. The patient is not nervous/anxious.       PHYSICAL EXAMINATION    There were no vitals filed for this visit.  Physical Exam Constitutional:      General: He is not in acute distress.    Appearance: Normal appearance. He is not toxic-appearing.  HENT:     Head: Normocephalic and atraumatic.  Eyes:     General: No scleral icterus. Cardiovascular:     Rate and Rhythm: Normal rate and regular rhythm.     Pulses: Normal pulses.     Heart sounds: Normal heart sounds.  Pulmonary:     Effort: Pulmonary effort is normal.     Breath sounds: Normal breath sounds.  Abdominal:     General: Abdomen is flat. Bowel sounds are normal. There is no distension.     Palpations: Abdomen is soft.     Tenderness: There is no abdominal tenderness.  Musculoskeletal:        General: No swelling.     Cervical back: Neck supple.  Lymphadenopathy:     Cervical: No cervical adenopathy.  Skin:    General: Skin is warm and dry.     Findings: No rash.  Neurological:     General: No focal deficit present.     Mental Status: He is alert.  Psychiatric:        Mood and Affect: Mood normal.        Behavior: Behavior normal.     LABORATORY DATA:  CBC    Component Value Date/Time   WBC 6.1 05/18/2022 1344   WBC 5.3 04/22/2022 0741   RBC 3.69 (L) 05/18/2022 1344   HGB 10.3 (L) 05/18/2022 1344   HCT 30.7 (L) 05/18/2022 1344   PLT 297 05/18/2022 1344   MCV 83.2 05/18/2022 1344   MCH 27.9 05/18/2022 1344   MCHC 33.6 05/18/2022 1344   RDW 15.5 05/18/2022 1344   LYMPHSABS 0.5 (L) 05/18/2022 1344   MONOABS 0.7 05/18/2022 1344   EOSABS 0.1 05/18/2022 1344   BASOSABS 0.0 05/18/2022 1344    CMP     Component Value Date/Time   NA 130 (L) 05/18/2022 1344   K 3.8 05/18/2022 1344   CL 94 (L) 05/18/2022 1344   CO2 29 05/18/2022 1344   GLUCOSE 80 05/18/2022 1344   BUN 8 05/18/2022 1344   CREATININE 0.66 05/18/2022 1344    CREATININE 1.07 03/01/2021 1649   CALCIUM 8.6 (L) 05/18/2022 1344   PROT 7.4 04/21/2022 1957   ALBUMIN 3.9 04/21/2022 1957   AST 31 04/21/2022 1957   ALT 21 04/21/2022 1957   ALKPHOS 44 04/21/2022 1957   BILITOT 0.6 04/21/2022 1957   GFRNONAA >60 05/18/2022 1344   GFRNONAA 86 09/22/2019 1121  GFRAA 99 09/22/2019 1121        ASSESSMENT and THERAPY PLAN:   No problem-specific Assessment & Plan notes found for this encounter.    All questions were answered. The patient knows to call the clinic with any problems, questions or concerns. We can certainly see the patient much sooner if necessary.  Total encounter time:*** minutes*in face-to-face visit time, chart review, lab review, care coordination, order entry, and documentation of the encounter time.  Lillard Anes, NP 05/24/22 6:55 PM Medical Oncology and Hematology [facility] [address] [place], [place] [address] Tel. [phone]    Fax. [phone]  *Total Encounter Time as defined by the Centers for Medicare and Medicaid Services includes, in addition to the face-to-face time of a patient visit (documented in the note above) non-face-to-face time: obtaining and reviewing outside history, ordering and reviewing medications, tests or procedures, care coordination (communications with other health care professionals or caregivers) and documentation in the medical record.

## 2022-05-25 ENCOUNTER — Inpatient Hospital Stay: Payer: Medicare Other | Admitting: Licensed Clinical Social Worker

## 2022-05-25 ENCOUNTER — Other Ambulatory Visit: Payer: Self-pay

## 2022-05-25 ENCOUNTER — Other Ambulatory Visit: Payer: Self-pay | Admitting: *Deleted

## 2022-05-25 ENCOUNTER — Inpatient Hospital Stay: Payer: Medicare Other | Admitting: Adult Health

## 2022-05-25 ENCOUNTER — Encounter: Payer: Self-pay | Admitting: Adult Health

## 2022-05-25 ENCOUNTER — Inpatient Hospital Stay: Payer: Medicare Other

## 2022-05-25 ENCOUNTER — Ambulatory Visit: Payer: Medicare Other

## 2022-05-25 ENCOUNTER — Ambulatory Visit: Payer: Medicare Other | Attending: Radiation Oncology

## 2022-05-25 ENCOUNTER — Telehealth: Payer: Self-pay | Admitting: *Deleted

## 2022-05-25 ENCOUNTER — Ambulatory Visit: Payer: Medicare Other | Attending: Radiation Oncology | Admitting: Physical Therapy

## 2022-05-25 ENCOUNTER — Ambulatory Visit: Payer: Medicare Other | Admitting: Nutrition

## 2022-05-25 ENCOUNTER — Other Ambulatory Visit: Payer: Self-pay | Admitting: Adult Health

## 2022-05-25 ENCOUNTER — Encounter: Payer: Self-pay | Admitting: Physical Therapy

## 2022-05-25 ENCOUNTER — Ambulatory Visit
Admission: RE | Admit: 2022-05-25 | Discharge: 2022-05-25 | Disposition: A | Discharge status: Home / Self Care | Payer: Medicare Other | Source: Ambulatory Visit | Attending: Radiation Oncology | Admitting: Radiation Oncology

## 2022-05-25 VITALS — BP 129/95 | HR 83 | Temp 98.4°F | Resp 18

## 2022-05-25 DIAGNOSIS — R293 Abnormal posture: Secondary | ICD-10-CM | POA: Diagnosis present

## 2022-05-25 DIAGNOSIS — C090 Malignant neoplasm of tonsillar fossa: Secondary | ICD-10-CM | POA: Diagnosis present

## 2022-05-25 DIAGNOSIS — R1310 Dysphagia, unspecified: Secondary | ICD-10-CM | POA: Insufficient documentation

## 2022-05-25 DIAGNOSIS — G893 Neoplasm related pain (acute) (chronic): Secondary | ICD-10-CM

## 2022-05-25 DIAGNOSIS — Z95828 Presence of other vascular implants and grafts: Secondary | ICD-10-CM | POA: Insufficient documentation

## 2022-05-25 DIAGNOSIS — Z510 Encounter for antineoplastic radiation therapy: Secondary | ICD-10-CM | POA: Diagnosis not present

## 2022-05-25 LAB — CBC WITH DIFFERENTIAL (CANCER CENTER ONLY)
Abs Immature Granulocytes: 0.06 10*3/uL (ref 0.00–0.07)
Basophils Absolute: 0 10*3/uL (ref 0.0–0.1)
Basophils Relative: 0 %
Eosinophils Absolute: 0 10*3/uL (ref 0.0–0.5)
Eosinophils Relative: 1 %
HCT: 28.4 % — ABNORMAL LOW (ref 39.0–52.0)
Hemoglobin: 9.7 g/dL — ABNORMAL LOW (ref 13.0–17.0)
Immature Granulocytes: 1 %
Lymphocytes Relative: 8 %
Lymphs Abs: 0.4 10*3/uL — ABNORMAL LOW (ref 0.7–4.0)
MCH: 27.6 pg (ref 26.0–34.0)
MCHC: 34.2 g/dL (ref 30.0–36.0)
MCV: 80.9 fL (ref 80.0–100.0)
Monocytes Absolute: 0.6 10*3/uL (ref 0.1–1.0)
Monocytes Relative: 11 %
Neutro Abs: 3.9 10*3/uL (ref 1.7–7.7)
Neutrophils Relative %: 79 %
Platelet Count: 618 10*3/uL — ABNORMAL HIGH (ref 150–400)
RBC: 3.51 MIL/uL — ABNORMAL LOW (ref 4.22–5.81)
RDW: 14.8 % (ref 11.5–15.5)
WBC Count: 5 10*3/uL (ref 4.0–10.5)
nRBC: 0 % (ref 0.0–0.2)

## 2022-05-25 LAB — RAD ONC ARIA SESSION SUMMARY
Course Elapsed Days: 17
Plan Fractions Treated to Date: 10
Plan Prescribed Dose Per Fraction: 2 Gy
Plan Total Fractions Prescribed: 35
Plan Total Prescribed Dose: 70 Gy
Reference Point Dosage Given to Date: 20 Gy
Reference Point Session Dosage Given: 2 Gy
Session Number: 10

## 2022-05-25 LAB — BASIC METABOLIC PANEL - CANCER CENTER ONLY
Anion gap: 8 (ref 5–15)
BUN: 7 mg/dL — ABNORMAL LOW (ref 8–23)
CO2: 32 mmol/L (ref 22–32)
Calcium: 8.9 mg/dL (ref 8.9–10.3)
Chloride: 87 mmol/L — ABNORMAL LOW (ref 98–111)
Creatinine: 0.97 mg/dL (ref 0.61–1.24)
GFR, Estimated: >60 mL/min (ref >60)
Glucose, Bld: 124 mg/dL — ABNORMAL HIGH (ref 70–99)
Potassium: 3.4 mmol/L — ABNORMAL LOW (ref 3.5–5.1)
Sodium: 127 mmol/L — ABNORMAL LOW (ref 135–145)

## 2022-05-25 LAB — MAGNESIUM: Magnesium: 1.8 mg/dL (ref 1.7–2.4)

## 2022-05-25 MED ORDER — SODIUM CHLORIDE 0.9% FLUSH: 10.0000 mL | INTRAVENOUS | Status: DC | PRN

## 2022-05-25 MED ORDER — SODIUM CHLORIDE 0.9 % IV SOLN: Freq: Once | INTRAVENOUS | Status: AC

## 2022-05-25 MED ORDER — POLYETHYLENE GLYCOL 3350 17 GM/SCOOP PO POWD: ORAL | 3 refills | Status: DC

## 2022-05-25 MED ORDER — PROCHLORPERAZINE MALEATE 10 MG PO TABS
10.0000 mg | ORAL_TABLET | Freq: Four times a day (QID) | ORAL | 1 refills | Status: DC | PRN
Start: 2022-05-25 — End: 2022-07-20

## 2022-05-25 MED ORDER — LIDOCAINE-PRILOCAINE 2.5-2.5 % EX CREA
TOPICAL_CREAM | CUTANEOUS | 3 refills | Status: DC
Start: 2022-05-25 — End: 2022-07-20

## 2022-05-25 MED ORDER — SODIUM CHLORIDE 0.9 % IV SOLN
10.0000 mg | Freq: Once | INTRAVENOUS | Status: AC
  Administered 2022-05-25: 10 mg via INTRAVENOUS
  Filled 2022-05-25: qty 10

## 2022-05-25 MED ORDER — SODIUM CHLORIDE 0.9 % IV SOLN
40.0000 mg/m2 | Freq: Once | INTRAVENOUS | Status: AC
  Administered 2022-05-25: 77 mg via INTRAVENOUS
  Filled 2022-05-25: qty 77

## 2022-05-25 MED ORDER — MAGNESIUM SULFATE 2 GM/50ML IV SOLN
2.0000 g | Freq: Once | INTRAVENOUS | Status: AC
  Administered 2022-05-25: 2 g via INTRAVENOUS
  Filled 2022-05-25: qty 50

## 2022-05-25 MED ORDER — SODIUM CHLORIDE 0.9% FLUSH
10.0000 mL | Freq: Once | INTRAVENOUS | Status: AC
  Administered 2022-05-25: 10 mL

## 2022-05-25 MED ORDER — ONDANSETRON HCL 8 MG PO TABS
8.0000 mg | ORAL_TABLET | Freq: Three times a day (TID) | ORAL | 1 refills | Status: DC | PRN
Start: 2022-05-25 — End: 2022-06-24

## 2022-05-25 MED ORDER — DEXAMETHASONE 4 MG PO TABS
ORAL_TABLET | ORAL | 1 refills | Status: DC
Start: 2022-05-25 — End: 2022-06-09

## 2022-05-25 MED ORDER — HEPARIN SOD (PORK) LOCK FLUSH 100 UNIT/ML IV SOLN
500.0000 [IU] | Freq: Once | INTRAVENOUS | Status: AC | PRN
  Administered 2022-05-25: 500 [IU]

## 2022-05-25 MED ORDER — OXYCODONE HCL 5 MG PO TABS
5.0000 mg | ORAL_TABLET | ORAL | 0 refills | Status: DC | PRN
Start: 2022-05-25 — End: 2022-06-06

## 2022-05-25 MED ORDER — PALONOSETRON HCL INJECTION 0.25 MG/5ML
0.2500 mg | Freq: Once | INTRAVENOUS | Status: AC
  Administered 2022-05-25: 0.25 mg via INTRAVENOUS
  Filled 2022-05-25: qty 5

## 2022-05-25 MED ORDER — POTASSIUM CHLORIDE IN NACL 20-0.9 MEQ/L-% IV SOLN
Freq: Once | INTRAVENOUS | Status: AC
  Filled 2022-05-25: qty 1000

## 2022-05-25 NOTE — Telephone Encounter (Signed)
This RNmet with pt - provided pt with chemo education per weekly cisplatin to enhance radiation therapy.  Pt was listening to gospel music on his headphones when this RN approached him.  Note pt was guarded at beginning- stating uncertainty of treatment with previous reluctance to use chemotherapy.  This RN validated his concerns- but reviewed noted intent of therapy as curative with compliance of treatment plan.  Pt stated " ok that makes me feel better"-   Treatment room process discussed and reviewed including showing pt waiting area for flush/infusion. Discussed on treatment day of need of good urination to initiate therapy with request that day before he aggressively hydrate himself.  Reviewed infusion room process.  Written instructions given regarding drugs used today as well as printed information with schedule for at home medications.Printed information in packet given to pt.  Reviewed with pt need for good hydration through out therapy and if needed this office can arrange for IV fluids if needed- presently he does not have a g tube and declines obtaining one.  He states " you see I think I have been getting radiation so far and doing well that God's gonna take care of me "  This RN validated his statement and belief with including that this nurse feels God has allowed people in this office to be present to help him as well.  In the process of the discussion pt became less guarded, more communiative and asked this RN to repeat that he heard the word cure related to his plan of care.  This RN had printed his treatment plan and high lighted goal of care as curative.  Per end of education - pt stated appreciation of discussion- and had noted smile.

## 2022-05-25 NOTE — Patient Instructions (Signed)
[facility]  Discharge Instructions: Thank you for choosing [facility] to provide your oncology and hematology care.   If you have a lab appointment with the Cancer Center, please go directly to the Cancer Center and check in at the registration area.   Wear comfortable clothing and clothing appropriate for easy access to any Portacath or PICC line.   We strive to give you quality time with your provider. You may need to reschedule your appointment if you arrive late (15 or more minutes).  Arriving late affects you and other patients whose appointments are after yours.  Also, if you miss three or more appointments without notifying the office, you may be dismissed from the clinic at the provider's discretion.      For prescription refill requests, have your pharmacy contact our office and allow 72 hours for refills to be completed.    Today you received the following chemotherapy and/or immunotherapy agents Cisplatin      To help prevent nausea and vomiting after your treatment, we encourage you to take your nausea medication as directed.  BELOW ARE SYMPTOMS THAT SHOULD BE REPORTED IMMEDIATELY: *FEVER GREATER THAN 100.4 F (38 C) OR HIGHER *CHILLS OR SWEATING *NAUSEA AND VOMITING THAT IS NOT CONTROLLED WITH YOUR NAUSEA MEDICATION *UNUSUAL SHORTNESS OF BREATH *UNUSUAL BRUISING OR BLEEDING *URINARY PROBLEMS (pain or burning when urinating, or frequent urination) *BOWEL PROBLEMS (unusual diarrhea, constipation, pain near the anus) TENDERNESS IN MOUTH AND THROAT WITH OR WITHOUT PRESENCE OF ULCERS (sore throat, sores in mouth, or a toothache) UNUSUAL RASH, SWELLING OR PAIN  UNUSUAL VAGINAL DISCHARGE OR ITCHING   Items with * indicate a potential emergency and should be followed up as soon as possible or go to the Emergency Department if any problems should occur.  Please show the CHEMOTHERAPY ALERT CARD or IMMUNOTHERAPY ALERT CARD at  check-in to the Emergency Department and triage nurse.  Should you have questions after your visit or need to cancel or reschedule your appointment, please contact [facility]  Dept: [phone]  and follow the prompts.  Office hours are 8:00 a.m. to 4:30 p.m. Monday - Friday. Please note that voicemails left after 4:00 p.m. may not be returned until the following business day.  We are closed weekends and major holidays. You have access to a nurse at all times for urgent questions. Please call the main number to the clinic Dept: [phone] and follow the prompts.   For any non-urgent questions, you may also contact your provider using MyChart. We now offer e-Visits for anyone 18 and older to request care online for non-urgent symptoms. For details visit [email or web address].   Also download the MyChart app! Go to the app store, search "MyChart", open the app, select , and log in with your MyChart username and password.   Cisplatin Injection What is this medication? CISPLATIN (SIS pla tin) treats some types of cancer. It works by slowing down the growth of cancer cells. This medicine may be used for other purposes; ask your health care provider or pharmacist if you have questions. COMMON BRAND NAME(S): Platinol, Platinol -AQ What should I tell my care team before I take this medication? They need to know if you have any of these conditions: Eye disease, vision problems Hearing problems Kidney disease Low blood counts, such as low white cells, platelets, or red blood cells Tingling of the fingers or toes, or other nerve disorder An   unusual or allergic reaction to cisplatin, carboplatin, oxaliplatin, other medications, foods, dyes, or preservatives If you or your partner are pregnant or trying to get pregnant Breast-feeding How should I use this medication? This medication is injected into a vein. It is given by your care team in a hospital  or clinic setting. Talk to your care team about the use of this medication in children. Special care may be needed. Overdosage: If you think you have taken too much of this medicine contact a poison control center or emergency room at once. NOTE: This medicine is only for you. Do not share this medicine with others. What if I miss a dose? Keep appointments for follow-up doses. It is important not to miss your dose. Call your care team if you are unable to keep an appointment. What may interact with this medication? Do not take this medication with any of the following: Live virus vaccines This medication may also interact with the following: Certain antibiotics, such as amikacin, gentamicin, neomycin, polymyxin B, streptomycin, tobramycin, vancomycin Foscarnet This list may not describe all possible interactions. Give your health care provider a list of all the medicines, herbs, non-prescription drugs, or dietary supplements you use. Also tell them if you smoke, drink alcohol, or use illegal drugs. Some items may interact with your medicine. What should I watch for while using this medication? Your condition will be monitored carefully while you are receiving this medication. You may need blood work done while taking this medication. This medication may make you feel generally unwell. This is not uncommon, as chemotherapy can affect healthy cells as well as cancer cells. Report any side effects. Continue your course of treatment even though you feel ill unless your care team tells you to stop. This medication may increase your risk of getting an infection. Call your care team for advice if you get a fever, chills, sore throat, or other symptoms of a cold or flu. Do not treat yourself. Try to avoid being around people who are sick. Avoid taking medications that contain aspirin, acetaminophen, ibuprofen, naproxen, or ketoprofen unless instructed by your care team. These medications may hide a  fever. This medication may increase your risk to bruise or bleed. Call your care team if you notice any unusual bleeding. Be careful brushing or flossing your teeth or using a toothpick because you may get an infection or bleed more easily. If you have any dental work done, tell your dentist you are receiving this medication. Drink fluids as directed while you are taking this medication. This will help protect your kidneys. Call your care team if you get diarrhea. Do not treat yourself. Talk to your care team if you or your partner wish to become pregnant or think you might be pregnant. This medication can cause serious birth defects if taken during pregnancy and for 14 months after the last dose. A negative pregnancy test is required before starting this medication. A reliable form of contraception is recommended while taking this medication and for 14 months after the last dose. Talk to your care team about effective forms of contraception. Do not father a child while taking this medication and for 11 months after the last dose. Use a condom during sex during this time period. Do not breast-feed while taking this medication. This medication may cause infertility. Talk to your care team if you are concerned about your fertility. What side effects may I notice from receiving this medication? Side effects that you should report to your   care team as soon as possible: Allergic reactions--skin rash, itching, hives, swelling of the face, lips, tongue, or throat Eye pain, change in vision, vision loss Hearing loss, ringing in ears Infection--fever, chills, cough, sore throat, wounds that don't heal, pain or trouble when passing urine, general feeling of discomfort or being unwell Kidney injury--decrease in the amount of urine, swelling of the ankles, hands, or feet Low red blood cell level--unusual weakness or fatigue, dizziness, headache, trouble breathing Painful swelling, warmth, or redness of the skin,  blisters or sores at the infusion site Pain, tingling, or numbness in the hands or feet Unusual bruising or bleeding Side effects that usually do not require medical attention (report to your care team if they continue or are bothersome): Hair loss Nausea Vomiting This list may not describe all possible side effects. Call your doctor for medical advice about side effects. You may report side effects to FDA at 1-800-FDA-1088. Where should I keep my medication? This medication is given in a hospital or clinic. It will not be stored at home. NOTE: This sheet is a summary. It may not cover all possible information. If you have questions about this medicine, talk to your doctor, pharmacist, or health care provider.  2023 Elsevier/Gold Standard (2021-04-22 00:00:00)  

## 2022-05-25 NOTE — Progress Notes (Signed)
Nutrition follow-up completed with patient during infusion for tonsil cancer.  Patient has refused G-tube placement.  He is receiving concurrent chemoradiation therapy.  Weight documented as 148 pounds 13 ounces on May 10.  Noted labs: Magnesium 1.8, sodium 127, potassium 3.4, glucose 124, BUN 7.  Estimated nutrition needs: 2000-2345 cal, 100-117 g protein, greater than 2.3 L fluid.  Patient reports he struggles to eat adequately due to altered taste.  He states most food tastes bland or he cannot taste it at all.  He denies difficulty consuming Ensure shakes or equivalent.  Currently denies other nutrition impact symptoms.  Nutrition diagnosis: Unintentional weight loss, stable.  Intervention: Reviewed strategies for improving taste alterations and provided nutrition fact sheet. Encouraged frequent gargles with baking soda and salt water rinses. Encourage patient to consume 2-3 cartons of Ensure Plus or equivalent.  Provided samples.  Monitoring, evaluation, goals: Patient will tolerate adequate calories and protein to minimize weight loss.  Next visit: Monday, May 20 after radiation treatments with Joli.  **Disclaimer: This note was dictated with voice recognition software. Similar sounding words can inadvertently be transcribed and this note may contain transcription errors which may not have been corrected upon publication of note.**\

## 2022-05-25 NOTE — Therapy (Signed)
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   Hemorrhoids    HIV (human immunodeficiency virus infection) (HCC)    Stenosis of cervical spine    withb myelopathy   Past Surgical History:  Procedure Laterality Date   ANTERIOR CERVICAL DECOMP/DISCECTOMY FUSION N/A 07/06/2017   Procedure: ANTERIOR CERVICAL DECOMPRESSION/DISCECTOMY FUSION C3-4/C4-5 2 LEVELS;  Surgeon: Lisbeth Renshaw, MD;  Location: [facility];  Service: Neurosurgery;  Laterality: N/A;   HERNIA REPAIR  97   lt ing   IR IMAGING GUIDED PORT INSERTION  05/19/2022   RECTAL EXAM UNDER ANESTHESIA N/A 04/24/2014   Procedure:  ligation of bleeding vessels;  Surgeon: Almond Lint, MD;  Location: [facility];  Service: General;  Laterality: N/A;   TENDON REPAIR     2006-lt index   WART FULGURATION  07/06/2011   Procedure: FULGURATION ANAL WART;  Surgeon: Shelly Rubenstein, MD;  Location: [facility];  Service: General;  Laterality: N/A;  excision anal condyloma   WART FULGURATION N/A 04/23/2014   Procedure: EXCISION OF ANAL CONDYLOMA;  Surgeon: Abigail Miyamoto, MD;  Location: [facility];  Service: General;  Laterality: N/A;   Patient Active Problem List   Diagnosis Date Noted   Port-A-Cath in place 05/25/2022   Cancer of tonsillar fossa (HCC) 04/25/2022   SCC (squamous cell carcinoma) 04/24/2022   Normocytic anemia 04/24/2022   Chronic back pain 02/24/2020   Ileus (HCC) 03/24/2019   MDD (major  depressive disorder), recurrent episode (HCC) 04/20/2018   Chronic right-sided low back pain 04/16/2018   Weakness of right lower extremity 04/16/2018   Healthcare maintenance 02/04/2018   Blood loss anemia 01/28/2018   Rectal bleeding 01/28/2018   Alcohol dependence with alcohol-induced mood disorder (HCC)    Major depressive disorder, recurrent severe without psychotic features (HCC) 08/01/2017   Dysphagia 07/08/2017   Stenosis of cervical spine with myelopathy (HCC) 07/06/2017   Left-sided weakness 06/27/2017   Tricompartment osteoarthritis of right knee 03/27/2017   Alcohol abuse with alcohol-induced mood disorder (HCC) 09/25/2016   Polysubstance dependence (HCC) 09/25/2016   Substance use disorder 09/19/2016   MDD (major depressive disorder), recurrent severe, without psychosis (HCC) 09/18/2016   Cocaine abuse (HCC) 07/27/2015   Suicidal ideation 07/27/2015   Cocaine dependence with intoxication with complication (HCC) 07/23/2015   Feeling like committing suicide 07/23/2015   Metabolic acidosis 07/23/2015   Tobacco abuse 07/23/2015   Substance induced mood disorder (HCC) 07/19/2015   Bipolar 1 disorder, depressed (HCC) 07/18/2015   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 Give me these (exercises) and let's  get this over with." Pt wrote short phrases next to 3 of the 5 exercises as SLP assisted pt with procedure.  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 He presented for care in [place], [place]. He presented in February 2024 with a large left neck mass and sore throat. CT neck in [place] showed a large left neck mass and  a tonsillar mass. Involvement of the left soft palate and possible invasion of the left tongue base were also appreciated. The scanned report is partially unreadable due to low quality, however findings detailed a a 6.1 cm mass-like area of matted left level 2 and level 3 lymph nodes with areas of necrosis, and a 6.2 cm fatty neoplasm posterior to the right sternocleidomastoid muscle. He was referred to Dr. Arlester Marker at [facility] for further management and a biopsy was completed on 3/6/2 of the left neck mass which showed metastatic SCC, p 16 +. He was then referred to [facility] because he is from this area and has family support here. He was scheduled with Dr. Basilio Cairo on 4/12 for consult but presented to the ED for increasing pain, SOB, stridor, secretion issues and swelling in his neck and was admitted to the hospital. Dr. Basilio Cairo saw him while he was in the hospital on 4/15. He was also seen by Dr. Leonides Schanz, but care was transferred to Dr. Al Pimple. 04/27/22 PET completed showing 3.6x2.5x4.8 cm left tonsillar mass extending into the epiglottis, Bulky conglomerate left level IIA adenopathy. 04/28/22 CT simulation. 05/04/22 Consult with Dr. Al Pimple. Treatment plan:  He will receive 35 fractions of radiation to his oropharynx and bilateral neck and weekly chemotherapy. Radiation started 4/29 & chemo will begin 5/16. He will finish 6/21. He has missed several radiation treatments and delayed chemotherapy several times. Pretreatment procedures: PAC placed 05/19/22. He has declined PEG at this time.  PAIN:  Are you having pain? Yes: NPRS scale: 5/10 Pain location: throat Pain description: sore Aggravating factors: nothing Relieving factors: nothing  FALLS: Has patient fallen in last 6 months?  See PT evaluation for details  LIVING ENVIRONMENT: Lives with: lives alone Lives in: House/apartment  PLOF:  Level of assistance: Independent with ADLs, Independent with IADLs  PATIENT GOALS:  Maintain WNL swallowing  OBJECTIVE:    COGNITION: Overall cognitive status: Within functional limits for tasks assessed  LANGUAGE: Receptive and Expressive language appeared WNL.  ORAL MOTOR EXAMINATION: Overall status: Did not assess  pt preferred to just review exercises. However, SLP appreciated large mass on pt's lt neck.  MOTOR SPEECH: Overall motor speech: Appears intact  CLINICAL SWALLOW ASSESSMENT:   Current diet: regular and thin liquids Dentition:  did not assess - pt preferred to just review exercises Patient directly observed with POs: Yes: regular and thin liquids  soup, crackers, and thin liquid (water) Feeding: able to feed self Liquids provided by: cup Oral phase signs and symptoms:  none noted today Pharyngeal phase signs and symptoms:  none noted today  TODAY'S TREATMENT:  DATE:   05/25/22 (eval): Research states the risk for dysphagia increases due to radiation and/or chemotherapy treatment due to a variety of factors, so SLP educated the pt about the possibility of reduced/limited ability for PO intake during rad tx. SLP also educated pt regarding possible changes to swallowing musculature after rad tx, and why adherence to dysphagia HEP provided today was necessary to inhibit muscle fibrosis following rad tx. SLP informed pt why this would be detrimental to their swallowing status and to their pulmonary health. Pt demonstrated understanding of these things to SLP. SLP encouraged pt to safely eat and drink as deep into their radiation/chemotherapy as possible to provide the best possible long-term swallowing outcome for pt.    SLP then developed an individualized HEP for pt involving oral and pharyngeal strengthening and ROM and pt was instructed how to perform these exercises, including SLP demonstration. Pt wrote 2-3 words in margin  of 3/5 exercises. After SLP demonstration, pt return demonstrated each exercise except Masako. Upon SLP request for pt return demo of Masako, pt stated, "It's OK, I get it, we can move on." SLP ensured pt performance was correct (except Masako) prior to educating pt on procedure for and observing pt perform the next exercise. Pt required occasional mod cues faded to modified independent to perform HEP (except Masako). Pt was instructed to complete this program 6-7 days/week, at least 2 times a day until 6 months after his or her last day of rad tx, and then x2 a week after that, indefinitely. Pt was also given choice of performing HEP with 5 reps at a time up to a total of 20-30/daily (minus Shaker, performed at 6 reps/day). Among other modifications for days when pt cannot functionally swallow, SLP also suggested pt cycle through the swallowing portion so the full program of exercises can be completed instead of fatiguing on one of the swallowing exercises and being unable to perform the other swallowing exercises. SLP instructed that swallowing exercises should then be added back into the regimen as pt is able to do so.   PATIENT EDUCATION: Education details: late effects head/neck radiation on swallow function, HEP procedure, and modification to HEP when difficulty experienced with swallowing during and after radiation course Person educated: Patient Education method: Explanation, Demonstration, Verbal cues, and Handouts Education comprehension: verbalized understanding, returned demonstration, verbal cues required, and needs further education   ASSESSMENT:  CLINICAL IMPRESSION: Patient is a 63 y.o. M who was seen today for assessment of swallowing as they undergo radiation/chemoradiation therapy. Today pt refused oral motor assessment and preferred to have SLP just observe him eat and educate him about exercises. He ate items from regular diet and drank thin liquids without overt s/s oral or  pharyngeal difficulty. At this time pt swallowing is deemed WNL/WFL with these POs. No oral or overt s/sx pharyngeal deficits, including aspiration were observed. There are no overt s/s aspiration PNA observed by SLP nor any reported by pt at this time. Data indicate that pt's swallow ability will likely decrease over the course of radiation/chemoradiation therapy and could very well decline over time following the conclusion of that therapy due to muscle disuse atrophy and/or muscle fibrosis. Pt will cont to need to be seen by SLP in order to assess safety of PO intake, assess the need for recommending any objective swallow assessment, and ensuring pt is correctly completing the individualized HEP.  OBJECTIVE IMPAIRMENTS: include dysphagia. These impairments are limiting patient from safety when swallowing. Factors affecting potential to  achieve goals and functional outcome are cooperation/participation level. Patient will benefit from skilled SLP services to address above impairments and improve overall function.  REHAB POTENTIAL: Good  GOALS: Goals reviewed with patient? No   SHORT TERM GOALS: Target: 3rd total session     1. Pt will complete HEP with modified independence in 2 sessions Baseline: Goal status: Initial     2.  pt will tell SLP why pt is completing HEP with modified independence Baseline:  Goal status: Initial     3.  pt ill describe 3 overt s/s aspiration PNA with modified independence Baseline:  Goal status: Initial     4.  pt will tell SLP how a food journal may help return to a more normalized   diet Baseline:  Goal status: Initial     LONG TERM GOALS: Target: 7th total session   1.  pt will complete HEP with independence over two visits Baseline:  Goal status: Initial   2.  pt will describe how to modify HEP over time, and the timeline associated with reduction in HEP frequency with modified independence over two sessions Baseline:  Goal status: Initial    PLAN:   SLP FREQUENCY:  once approx every 4 weeks   SLP DURATION:  6 sessions (7 total sessions)   PLANNED INTERVENTIONS: Aspiration precaution training, Pharyngeal strengthening exercises, Diet toleration management , Environmental controls, Trials of upgraded texture/liquids, Cueing hierachy, Internal/external aids, SLP instruction and feedback, Compensatory strategies, and Patient/family education    [facility], CCC-SLP 05/25/2022, 10:12 PM

## 2022-05-25 NOTE — Telephone Encounter (Signed)
Per discussion with pt today in chemo room- he stated he has to use 2 of the 5mg  of oxy ir for good pain control which allows him to function well including intake of food and hydration.  Prescription is written to take 1 tab q 4 hours.  This RN informed him above will be reviewed with [facility]/DNP for appropriate documentation for refills.  Noted pt has approximately 20 tabs left in bottle he showed to this RN.  This RN also discussed concern with constipation per increasing of pain medication- he requested what to use- this RN informed him of miralax - with order being sent to his pharmacy to obtain.  Above discussed with [facility]/DNP who refilled medication with new instructions due to pt will need likely need refill over the weekend.  No further needs at this time.

## 2022-05-25 NOTE — Therapy (Signed)
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   Hemorrhoids    HIV (human immunodeficiency virus infection) (HCC)    Stenosis of cervical spine    withb myelopathy   Past Surgical History:  Procedure Laterality Date   ANTERIOR CERVICAL DECOMP/DISCECTOMY FUSION N/A 07/06/2017   Procedure: ANTERIOR CERVICAL DECOMPRESSION/DISCECTOMY FUSION C3-4/C4-5 2 LEVELS;  Surgeon: Lisbeth Renshaw, MD;  Location: [facility] OR;  Service: Neurosurgery;  Laterality: N/A;   HERNIA REPAIR  97   lt ing   IR IMAGING GUIDED PORT INSERTION  05/19/2022   RECTAL EXAM UNDER ANESTHESIA N/A 04/24/2014   Procedure:  ligation of bleeding vessels;  Surgeon: Almond Lint, MD;  Location: [facility];  Service: General;  Laterality: N/A;   TENDON REPAIR     2006-lt index   WART FULGURATION  07/06/2011   Procedure: FULGURATION ANAL WART;  Surgeon: Shelly Rubenstein, MD;  Location: [facility];  Service: General;  Laterality: N/A;  excision anal condyloma   WART FULGURATION N/A 04/23/2014   Procedure: EXCISION OF ANAL CONDYLOMA;  Surgeon: Abigail Miyamoto, MD;  Location: [facility];  Service: General;  Laterality: N/A;   Patient Active Problem List   Diagnosis Date Noted   Port-A-Cath in place 05/25/2022   Cancer of tonsillar fossa (HCC) 04/25/2022   SCC (squamous cell carcinoma) 04/24/2022   Normocytic anemia 04/24/2022   Chronic back pain 02/24/2020   Ileus (HCC) 03/24/2019   MDD (major  depressive disorder), recurrent episode (HCC) 04/20/2018   Chronic right-sided low back pain 04/16/2018   Weakness of right lower extremity 04/16/2018   Healthcare maintenance 02/04/2018   Blood loss anemia 01/28/2018   Rectal bleeding 01/28/2018   Alcohol dependence with alcohol-induced mood disorder (HCC)    Major depressive disorder, recurrent severe without psychotic features (HCC) 08/01/2017   Dysphagia 07/08/2017   Stenosis of cervical spine with myelopathy (HCC) 07/06/2017   Left-sided weakness 06/27/2017   Tricompartment osteoarthritis of right knee 03/27/2017   Alcohol abuse with alcohol-induced mood disorder (HCC) 09/25/2016   Polysubstance dependence (HCC) 09/25/2016   Substance use disorder 09/19/2016   MDD (major depressive disorder), recurrent severe, without psychosis (HCC) 09/18/2016   Cocaine abuse (HCC) 07/27/2015   Suicidal ideation 07/27/2015   Cocaine dependence with intoxication with complication (HCC) 07/23/2015   Feeling like committing suicide 07/23/2015   Metabolic acidosis 07/23/2015   Tobacco abuse 07/23/2015   Substance induced mood disorder (HCC) 07/19/2015   Bipolar 1 disorder, depressed (HCC) 07/18/2015   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 He presented in February 2024 with a large left neck mass and sore throat. CT neck in [place] showed a large left neck mass and a tonsillar mass.  Involvement of the left soft palate and possible invasion of the left tongue base were also appreciated. The scanned report is partially unreadable due to low quality, however findings detailed a a 6.1 cm mass-like area of matted left level 2 and level 3 lymph nodes with areas of necrosis, and a 6.2 cm fatty neoplasm posterior to the right sternocleidomastoid muscle. He was referred to Dr. Arlester Marker at [facility] for further management and a biopsy was completed on 03/15/22 of the left neck mass which showed metastatic SCC, p 16 +. He was then referred to [facility] because he is from this area and has family support here. He was scheduled with Dr. Basilio Cairo on 4/12 for consult but presented to the ED for increasing pain, SOB, stridor, secretion issues and swelling in his neck and was admitted to the hospital. Dr. Basilio Cairo saw him while he was in the hospital on 4/15. He was also seen by Dr. Leonides Schanz, but care was transferred to Dr. Al Pimple. 04/27/22 PET completed showing 3.6x2.5x4.8 cm left tonsillar mass extending into the epiglottis, Bulky conglomerate left level IIA adenopathy. He will receive 35 fractions of radiation to his oropharynx and bilateral neck and weekly chemotherapy. Radiation started 4/29 & chemo will begin 5/16. He will finish 6/21. He has missed several radiation treatments and delayed chemotherapy several times. PAC placed 05/19/22. He has declined PEG at this time. He has stenosis of his cervical spine and walks using a cane with a profound limp.   PATIENT GOALS:   to be educated about the signs and symptoms of lymphedema and learn post op HEP.   PAIN:  Are you having pain? Yes: NPRS scale: did not rate on scale/10 Pain location: throat Pain description: "just hurts" Aggravating factors: nothing Relieving factors: nothing  PRECAUTIONS: Active CA  WEIGHT BEARING RESTRICTIONS: No  FALLS:  Has patient fallen in last 6 months? No Does the patient have a fear of  falling that limits activity? No Is the patient reluctant to leave the house due to a fear of falling?No  LIVING ENVIRONMENT: Patient lives with: alone Lives in: House/apartment Has following equipment at home: None   LEISURE: does stretches and push ups at counter in the morning  PRIOR LEVEL OF FUNCTION: Independent   OBJECTIVE:  COGNITION: Overall cognitive status: Within functional limits for tasks assessed                  POSTURE:  Forward head and rounded shoulders posture  30 SEC SIT TO STAND: Unable to assess - pt receiving chemo  SHOULDER AROM:   Not tested   CERVICAL AROM: limited by tumor bulk   Percent limited  Flexion WFL  Extension 50% limited  Right lateral flexion 50% limited  Left lateral flexion 50% limited  Right rotation 50% limited  Left rotation 50% limited    (Blank rows=not tested)  GAIT: Unable to assess as pt was receiving chemo  PATIENT EDUCATION:  Education details: Neck ROM, importance of posture when sitting, standing and lying down, deep breathing,  walking program and importance of staying active throughout treatment, CURE article on staying active, "Why exercise?" flyer, lymphedema and PT info Person educated: Patient Education method: Explanation, Demonstration, Handout Education comprehension: Patient verbalized understanding and returned demonstration  HOME EXERCISE PROGRAM: Patient was instructed today in a home exercise program today for head and neck range of motion exercises. These included active cervical flexion, active cervical extension, active cervical rotation to each direction, upper trap stretch, and shoulder retraction. Patient was encouraged to do these 2-3 times a day, holding for 5 sec each and completing for 5 reps. Pt was educated that once this becomes easier then hold the stretches for 30-60 seconds.    ASSESSMENT:  CLINICAL IMPRESSION: Pt arrives to PT with recently diagnosed tonsillar cancer. He will  receive 35 fractions of radiation to his oropharynx and bilateral neck and weekly chemotherapy. Radiation started 4/29 & chemo will begin 5/16. He will finish 6/21. Pt's cervical ROM was limited approx 50% at baseline due to tumor bulk. Educated pt about signs and symptoms of lymphedema as well as anatomy and physiology of lymphatic system. Educated pt in importance of staying as active as possible throughout treatment to decrease fatigue as well as head and neck ROM exercises to decrease loss of ROM. Will see pt after completion of radiation to reassess ROM and assess for lymphedema and to determine therapy needs at that time.  Pt will benefit from skilled therapeutic intervention to improve on the following deficits: Decreased knowledge of precautions and postural dysfunction, decreased ROM  PT treatment/interventions: ADL/self-care home management, pt/family education, therapeutic exercise.   REHAB POTENTIAL: Good  CLINICAL DECISION MAKING: Stable/uncomplicated  EVALUATION COMPLEXITY: Low   GOALS: Goals reviewed with patient? YES  LONG TERM GOALS: (STG=LTG)   Name Target Date  Goal status  1 Patient will be able to verbalize understanding of a home exercise program for cervical range of motion, posture, and walking.   Baseline:  No knowledge 05/25/2022 Achieved at eval  2 Patient will be able to verbalize understanding of proper sitting and standing posture. Baseline:  No knowledge 05/25/2022 Achieved at eval  3 Patient will be able to verbalize understanding of lymphedema risk and availability of treatment for this condition Baseline:  No knowledge 05/25/2022 Achieved at eval  4 Pt will demonstrate a return to full cervical ROM and function post operatively compared to baselines and not demonstrate any signs or symptoms of lymphedema.  Baseline: See objective measurements taken today. 08/03/22 New    PLAN:  PT FREQUENCY/DURATION: EVAL and 1 follow up appointment.   PLAN FOR NEXT  SESSION: will reassess 2 weeks after completion of radiation to determine needs.  Patient will follow up at outpatient cancer rehab 2 weeks after completion of radiation.  If the patient requires physical therapy at that time, a specific plan will be dictated and sent to the referring physician for approval. The patient was educated today on appropriate basic range of motion exercises to begin now and continue throughout radiation and educated on the signs and symptoms of lymphedema. Patient verbalized good understanding.     Physical Therapy Information for During and After Head/Neck Cancer Treatment: Lymphedema is a swelling condition that you may be at risk for in your neck and/or face if you have radiation treatment to the area and/or if you have surgery that includes removing lymph nodes.  There is treatment available for this condition and it is not life-threatening.  Contact your physician or physical therapist with concerns. An excellent  resource for those seeking information on lymphedema is the [employer] Lymphedema [employer].  It can be accessed at www.lymphnet.org If you notice swelling in your neck or face at any time following surgery (even if it is many years from now), please contact your doctor or physical therapist to discuss this.  Lymphedema can be treated at any time but it is easier for you if it is treated early on. If you have had surgery to your neck, please check with your surgeon about how soon to start doing neck range of motion exercises.  If you are not having surgery, I encourage you to start doing neck range of motion exercises today and continue these while undergoing treatment, UNLESS you have irritation of your skin or soft tissue that is aggravated by doing them.  These exercises are intended to help you prevent loss of range of motion and/or to gain range of motion in your neck (which can be limited by tightening effects of radiation), and NOT to aggravate these  tissues if they develop sensitivities from treatment. Neck range of motion exercises should be done to the point of feeling a GENTLE, TOLERABLE stretch only.  You are encouraged to start a walking or other exercise program tomorrow and continue this as much as you are able through and after treatment.  Please feel free to call me with any questions. Leonette Most, PT, CLT Physical Therapist and Certified Lymphedema Therapist [facility] [address], [address], [place], [place] [address] [phone] [email or web address]  WALKING  Walking is a great form of exercise to increase your strength, endurance and overall fitness.  A walking program can help you start slowly and gradually build endurance as you go.  Everyone's ability is different, so each person's starting point will be different.  You do not have to follow them exactly.  The are just samples. You should simply find out what's right for you and stick to that program.   In the beginning, you'll start off walking 2-3 times a day for short distances.  As you get stronger, you'll be walking further at just 1-2 times per day.  A. You Can Walk For A Certain Length Of Time Each Day    Walk 5 minutes 3 times per day.  Increase 2 minutes every 2 days (3 times per day).  Work up to 25-30 minutes (1-2 times per day).   Example:   Day 1-2 5 minutes 3 times per day   Day 7-8 12 minutes 2-3 times per day   Day 13-14 25 minutes 1-2 times per day  B. You Can Walk For a Certain Distance Each Day     Distance can be substituted for time.    Example:   3 trips to mailbox (at road)   3 trips to corner of block   3 trips around the block  C. Go to local high school and use the track.    Walk for distance ____ around track  Or time ____ minutes  D. Walk ____ Jog ____ Run ___   Why exercise?  So many benefits! Here are SOME of them: Heart health, including raising your good cholesterol level  and reducing heart rate and blood pressure Lung health, including improved lung capacity It burns fats, and most of [place] can stand to be leaner, whether or not we are overweight. It increases the body's natural painkillers and mood elevators, so makes you feel better. Not only makes you feel better, but look  better too Improves sleep Takes a bite out of stress May decrease your risk of many types of cancer If you are currently undergoing cancer treatment, exercise may improve your ability to tolerate treatments including chemotherapy. For everybody, it can improve your energy level. Those with cancer-related fatigue report a 40-50% reduction in this symptom when exercising regularly. If you are a survivor of breast, colon, or prostate cancer, it may decrease your risk of a recurrence. (This may hold for other cancers too, but so far we have data just for these three types.)  How to exercise: Get your doctor's okay. Pick something you enjoy doing, like walking, Zumba, biking, swimming, or whatever. Start at low intensity and time, then gradually increase.  (See walking program handout.) Set a goal to achieve over time.  The American Cancer Society, American Heart Association, and U.S. Dept. of Health and Human Services recommend 150 minutes of moderate exercise, 75 minutes of vigorous exercise, or a combination of both per week. This should be done in episodes at least 10 minutes long, spread throughout the week.  Need help being motivated? Pick something you enjoy doing, because you'll be more inclined to stick with that activity than something that feels like a chore. Do it with a friend so that you are accountable to each other. Schedule it into your day. Place it on your calendar and keep that appointment just like you do any appointment that you make. Join an exercise group that meets at a specific time.  That way, you have to show up on time, and that makes it harder to procrastinate about  doing your workout.  It also keeps you accountable--people begin to expect you to be there. Join a gym where you feel comfortable and not intimidated, at the right cost. Sign up for something that you'll need to be in shape for on a specific date, like a 1K or a 5K to walk or run, a 20 or 30 mile bike ride, a mud run or something like that. If the date is looming, you know you'll need to train to be ready for it.  An added benefit is that many of these are fundraisers for good causes. If you've already paid for a gym membership, group exercise class or event, you might as well work out, so you haven't wasted your money!    [facility], PT 05/25/2022, 12:29 PM

## 2022-05-25 NOTE — Patient Instructions (Signed)
SWALLOWING EXERCISES ?Do these until 6 months after your last day of radiation, then 2 times per week afterwards ? ?Effortful Swallows ?- Press your tongue against the roof of your mouth for 3 seconds, then squeeze the muscles in your neck while you swallow your saliva or a sip of water ?- Repeat 10-15 times, 2-3 times a day, and use whenever you eat or drink ? ?Masako Swallow - swallow with your tongue sticking out ?- Stick tongue out past your lips and gently bite tongue with your teeth ?- Swallow, while holding your tongue with your teeth ?- Repeat 10-15 times, 2-3 times a day ?*use a wet spoon if your mouth gets dry* ? ?Shaker Exercise - head lift ?- Lie flat on your back in your bed or on a couch without pillows ?- Raise your head and look at your feet - KEEP YOUR SHOULDERS DOWN ?- HOLD FOR 45-60 SECONDS, then lower your head back down ?- Repeat 3 times, 2-3 times a day ? ?Mendelsohn Maneuver - ?half swallow? exercise ?- Start to swallow, and keep your Adam?s apple up by squeezing hard with the muscles of the throat ?- Hold the squeeze for 5-7 seconds and then relax ?- Repeat 10-15 times, 2-3 times a day ?*use a wet spoon if your mouth gets dry* ? ?      5.   "Super Swallow" ? - Take a breath and hold it ? - Swallow then IMMEDIATELY cough ? - Repeat 10 times, 2-3 times a day ? ?

## 2022-05-25 NOTE — Progress Notes (Signed)
[facility] CSW Progress Note  Visual merchandiser met with patient briefly in infusion for coping support. Pt reports to be in good spirits today even after meeting with many members of his treatment team and receiving first infusion. He was on the phone with his younger sister who is a strong support for him as well.  No needs today. CSW will provide periodic check-ins with pt during treatment and is available as needed between visits.    [name] E Dmarion Perfect, LCSW Clinical Social Worker [facility]

## 2022-05-25 NOTE — Progress Notes (Signed)
Received call from patient after receiving my card per my request.  Introduced myself as Dance movement psychotherapist and to offer available resources. Discussed one-time $1000 Marketing executive to assist with personal expenses while going through treatment. Advised what is needed to apply. Patient will contact me once he is able to provide information to proceed to apply.  He has my card to do so and for any additional financial questions or concerns.

## 2022-05-26 ENCOUNTER — Ambulatory Visit
Admission: RE | Admit: 2022-05-26 | Discharge: 2022-05-26 | Disposition: A | Discharge status: Home / Self Care | Payer: Medicare Other | Source: Ambulatory Visit | Attending: Radiation Oncology | Admitting: Radiation Oncology

## 2022-05-26 ENCOUNTER — Other Ambulatory Visit: Payer: Self-pay

## 2022-05-26 ENCOUNTER — Ambulatory Visit: Payer: Medicare Other

## 2022-05-26 ENCOUNTER — Telehealth: Payer: Self-pay | Admitting: Radiation Oncology

## 2022-05-26 DIAGNOSIS — Z510 Encounter for antineoplastic radiation therapy: Secondary | ICD-10-CM | POA: Diagnosis not present

## 2022-05-26 LAB — RAD ONC ARIA SESSION SUMMARY
Course Elapsed Days: 18
Plan Fractions Treated to Date: 11
Plan Prescribed Dose Per Fraction: 2 Gy
Plan Total Fractions Prescribed: 35
Plan Total Prescribed Dose: 70 Gy
Reference Point Dosage Given to Date: 22 Gy
Reference Point Session Dosage Given: 2 Gy
Session Number: 11

## 2022-05-26 NOTE — Telephone Encounter (Signed)
5/17 @ 2:13 pm Christian from [employer] called to let someone know that Mr. [name] missed his ride and was getting someone else to bring him for his treatment appt today and had a fall while getting in his 2nd ride, but is still coming to his appt. running late, but will be here.  Called L3 machine spoke to [place] so they are aware.

## 2022-05-29 ENCOUNTER — Ambulatory Visit
Admission: RE | Admit: 2022-05-29 | Discharge: 2022-05-29 | Disposition: A | Discharge status: Home / Self Care | Payer: Medicare Other | Source: Ambulatory Visit | Attending: Radiation Oncology | Admitting: Radiation Oncology

## 2022-05-29 ENCOUNTER — Ambulatory Visit: Payer: Medicare Other

## 2022-05-29 ENCOUNTER — Encounter: Payer: Self-pay | Admitting: Hematology and Oncology

## 2022-05-29 ENCOUNTER — Inpatient Hospital Stay: Payer: Medicare Other

## 2022-05-29 ENCOUNTER — Encounter: Payer: Self-pay | Admitting: Adult Health

## 2022-05-29 ENCOUNTER — Other Ambulatory Visit: Payer: Self-pay

## 2022-05-29 DIAGNOSIS — Z510 Encounter for antineoplastic radiation therapy: Secondary | ICD-10-CM | POA: Diagnosis not present

## 2022-05-29 LAB — RAD ONC ARIA SESSION SUMMARY
Course Elapsed Days: 21
Plan Fractions Treated to Date: 12
Plan Prescribed Dose Per Fraction: 2 Gy
Plan Total Fractions Prescribed: 35
Plan Total Prescribed Dose: 70 Gy
Reference Point Dosage Given to Date: 24 Gy
Reference Point Session Dosage Given: 2 Gy
Session Number: 12

## 2022-05-29 NOTE — Telephone Encounter (Signed)
Called & spoke with pt & he reports doing well post chemo & denies problems.  He knows his next appt & how to reach [place] if needed.

## 2022-05-29 NOTE — Telephone Encounter (Signed)
-----   Message from Bradd Burner, RN sent at 05/25/2022  4:34 PM EDT ----- Regarding: Dr. Al Pimple - first time Cisplatin f/u call - pt tolerated well

## 2022-05-29 NOTE — Progress Notes (Signed)
Nutrition Follow-up:   Patient with tonsil cancer.  Patient has refused G-tube placement.  He is receiving concurrent chemotherapy and radiation.    Met with patient following radiation today.  He says that he is eating well.  Says that he ate 3 eggs, 4 pieces of bacon and grits this am.  Also eating oodles and noodles, frozen meals of chicken and mashed potatoes, lasagna, spaghetti, pot pies.  Drinking ensure 3 a day (likes chocolate).  Says that he has been doing some exercises at home (push ups on counter, shoulder rolls, arm circles, etc).  Denies nausea.  Says that he is having a little pain when swallowing.  Doing the lidocaine rinse and baking soda salt water rinses.  Says that he can taste eggs and bacon.     Medications: reviewed  Labs: reviewed  Anthropometrics:    Weight: 154 lb 8 oz today in RD office  148 lb 13 oz on 5/10   NUTRITION DIAGNOSIS: Unintentional weight loss stable    INTERVENTION:  Continue 350 calorie shake at least TID Continue high calorie foods and high protein foods.     MONITORING, EVALUATION, GOAL: weight trends, intake   NEXT VISIT: Friday, May 24 during infusion  Tamela Elsayed B. Freida Busman, RD, LDN Registered Dietitian [phone]

## 2022-05-30 ENCOUNTER — Inpatient Hospital Stay: Payer: Medicare Other

## 2022-05-30 ENCOUNTER — Ambulatory Visit: Payer: Medicare Other

## 2022-05-30 ENCOUNTER — Other Ambulatory Visit: Payer: Self-pay

## 2022-05-30 ENCOUNTER — Telehealth: Payer: Self-pay

## 2022-05-30 ENCOUNTER — Ambulatory Visit
Admission: RE | Admit: 2022-05-30 | Discharge: 2022-05-30 | Disposition: A | Discharge status: Home / Self Care | Payer: Medicare Other | Source: Ambulatory Visit | Attending: Radiation Oncology | Admitting: Radiation Oncology

## 2022-05-30 DIAGNOSIS — Z510 Encounter for antineoplastic radiation therapy: Secondary | ICD-10-CM | POA: Diagnosis not present

## 2022-05-30 LAB — RAD ONC ARIA SESSION SUMMARY
Course Elapsed Days: 22
Plan Fractions Treated to Date: 13
Plan Prescribed Dose Per Fraction: 2 Gy
Plan Total Fractions Prescribed: 35
Plan Total Prescribed Dose: 70 Gy
Reference Point Dosage Given to Date: 26 Gy
Reference Point Session Dosage Given: 2 Gy
Session Number: 13

## 2022-05-30 NOTE — Telephone Encounter (Signed)
Notified Patient of prior authorization approval for Oxycodone HCL 5mg  Tablets. Medication is approved until further notice. No other needs or concerns voiced at this time.

## 2022-05-31 ENCOUNTER — Ambulatory Visit
Admission: RE | Admit: 2022-05-31 | Discharge: 2022-05-31 | Disposition: A | Discharge status: Home / Self Care | Payer: Medicare Other | Source: Ambulatory Visit | Attending: Radiation Oncology | Admitting: Radiation Oncology

## 2022-05-31 ENCOUNTER — Inpatient Hospital Stay: Payer: Medicare Other

## 2022-05-31 ENCOUNTER — Ambulatory Visit: Payer: Medicare Other

## 2022-05-31 ENCOUNTER — Other Ambulatory Visit: Payer: Self-pay

## 2022-05-31 DIAGNOSIS — Z510 Encounter for antineoplastic radiation therapy: Secondary | ICD-10-CM | POA: Diagnosis not present

## 2022-05-31 LAB — RAD ONC ARIA SESSION SUMMARY
Course Elapsed Days: 23
Plan Fractions Treated to Date: 14
Plan Prescribed Dose Per Fraction: 2 Gy
Plan Total Fractions Prescribed: 35
Plan Total Prescribed Dose: 70 Gy
Reference Point Dosage Given to Date: 28 Gy
Reference Point Session Dosage Given: 2 Gy
Session Number: 14

## 2022-06-01 ENCOUNTER — Ambulatory Visit
Admission: RE | Admit: 2022-06-01 | Discharge: 2022-06-01 | Disposition: A | Discharge status: Home / Self Care | Payer: Medicare Other | Source: Ambulatory Visit | Attending: Radiation Oncology | Admitting: Radiation Oncology

## 2022-06-01 ENCOUNTER — Telehealth: Payer: Self-pay

## 2022-06-01 ENCOUNTER — Other Ambulatory Visit: Payer: Self-pay | Admitting: Radiation Oncology

## 2022-06-01 ENCOUNTER — Ambulatory Visit: Payer: Medicare Other

## 2022-06-01 ENCOUNTER — Inpatient Hospital Stay: Payer: Medicare Other

## 2022-06-01 ENCOUNTER — Other Ambulatory Visit: Payer: Self-pay

## 2022-06-01 DIAGNOSIS — Z510 Encounter for antineoplastic radiation therapy: Secondary | ICD-10-CM | POA: Diagnosis not present

## 2022-06-01 DIAGNOSIS — C4492 Squamous cell carcinoma of skin, unspecified: Secondary | ICD-10-CM

## 2022-06-01 LAB — RAD ONC ARIA SESSION SUMMARY
Course Elapsed Days: 24
Plan Fractions Treated to Date: 15
Plan Prescribed Dose Per Fraction: 2 Gy
Plan Total Fractions Prescribed: 35
Plan Total Prescribed Dose: 70 Gy
Reference Point Dosage Given to Date: 30 Gy
Reference Point Session Dosage Given: 2 Gy
Session Number: 15

## 2022-06-01 MED ORDER — LIDOCAINE VISCOUS HCL 2 % MT SOLN
OROMUCOSAL | 3 refills | Status: DC
Start: 2022-06-01 — End: 2022-06-09

## 2022-06-01 NOTE — Telephone Encounter (Signed)
Rn called pt to inform him that his Viscous Lidocaine has been refilled by Dr. Roselind Messier and he can pick it up at the pharmacy (when filled). He was grateful for the quick response.

## 2022-06-01 NOTE — Telephone Encounter (Signed)
Rn called pt after voicemail was left for nursing staff. Pt stated on return phone call he needed his Viscous Lidocaine refilled. He stated he was completely out of his supply. Rn is reaching out to Dr. Roselind Messier for this refill as Dr. Basilio Cairo is off today. Rn will call pt once refill has been submitted for pt.

## 2022-06-02 ENCOUNTER — Inpatient Hospital Stay: Payer: Medicare Other | Admitting: Dietician

## 2022-06-02 ENCOUNTER — Inpatient Hospital Stay: Payer: Medicare Other

## 2022-06-02 ENCOUNTER — Encounter: Payer: Self-pay | Admitting: Hematology and Oncology

## 2022-06-02 ENCOUNTER — Ambulatory Visit
Admission: RE | Admit: 2022-06-02 | Discharge: 2022-06-02 | Disposition: A | Discharge status: Home / Self Care | Payer: Medicare Other | Source: Ambulatory Visit | Attending: Radiation Oncology | Admitting: Radiation Oncology

## 2022-06-02 ENCOUNTER — Encounter: Payer: Self-pay | Admitting: Adult Health

## 2022-06-02 ENCOUNTER — Ambulatory Visit: Payer: Medicare Other

## 2022-06-02 ENCOUNTER — Other Ambulatory Visit: Payer: Self-pay

## 2022-06-02 ENCOUNTER — Inpatient Hospital Stay (HOSPITAL_BASED_OUTPATIENT_CLINIC_OR_DEPARTMENT_OTHER): Payer: Medicare Other | Admitting: Adult Health

## 2022-06-02 VITALS — BP 134/73 | HR 78 | Temp 98.1°F | Resp 16

## 2022-06-02 VITALS — BP 133/64 | HR 80 | Temp 98.1°F | Resp 19 | Ht 72.0 in | Wt 149.1 lb

## 2022-06-02 DIAGNOSIS — C760 Malignant neoplasm of head, face and neck: Secondary | ICD-10-CM

## 2022-06-02 DIAGNOSIS — C090 Malignant neoplasm of tonsillar fossa: Secondary | ICD-10-CM

## 2022-06-02 DIAGNOSIS — Z510 Encounter for antineoplastic radiation therapy: Secondary | ICD-10-CM | POA: Diagnosis not present

## 2022-06-02 LAB — RAD ONC ARIA SESSION SUMMARY
Course Elapsed Days: 25
Plan Fractions Treated to Date: 16
Plan Prescribed Dose Per Fraction: 2 Gy
Plan Total Fractions Prescribed: 35
Plan Total Prescribed Dose: 70 Gy
Reference Point Dosage Given to Date: 32 Gy
Reference Point Session Dosage Given: 2 Gy
Session Number: 16

## 2022-06-02 LAB — COMPREHENSIVE METABOLIC PANEL
ALT: 49 U/L — ABNORMAL HIGH (ref 0–44)
AST: 39 U/L (ref 15–41)
Albumin: 3.8 g/dL (ref 3.5–5.0)
Alkaline Phosphatase: 45 U/L (ref 38–126)
Anion gap: 9 (ref 5–15)
BUN: 29 mg/dL — ABNORMAL HIGH (ref 8–23)
CO2: 27 mmol/L (ref 22–32)
Calcium: 8.9 mg/dL (ref 8.9–10.3)
Chloride: 91 mmol/L — ABNORMAL LOW (ref 98–111)
Creatinine, Ser: 1.17 mg/dL (ref 0.61–1.24)
GFR, Estimated: >60 mL/min (ref >60)
Glucose, Bld: 118 mg/dL — ABNORMAL HIGH (ref 70–99)
Potassium: 4.3 mmol/L (ref 3.5–5.1)
Sodium: 127 mmol/L — ABNORMAL LOW (ref 135–145)
Total Bilirubin: 0.5 mg/dL (ref 0.3–1.2)
Total Protein: 6.4 g/dL — ABNORMAL LOW (ref 6.5–8.1)

## 2022-06-02 LAB — CBC WITH DIFFERENTIAL/PLATELET
Abs Immature Granulocytes: 0.51 10*3/uL — ABNORMAL HIGH (ref 0.00–0.07)
Basophils Absolute: 0 10*3/uL (ref 0.0–0.1)
Basophils Relative: 0 %
Eosinophils Absolute: 0 10*3/uL (ref 0.0–0.5)
Eosinophils Relative: 0 %
HCT: 28.9 % — ABNORMAL LOW (ref 39.0–52.0)
Hemoglobin: 9.9 g/dL — ABNORMAL LOW (ref 13.0–17.0)
Immature Granulocytes: 5 %
Lymphocytes Relative: 1 %
Lymphs Abs: 0.1 10*3/uL — ABNORMAL LOW (ref 0.7–4.0)
MCH: 27.2 pg (ref 26.0–34.0)
MCHC: 34.3 g/dL (ref 30.0–36.0)
MCV: 79.4 fL — ABNORMAL LOW (ref 80.0–100.0)
Monocytes Absolute: 0.4 10*3/uL (ref 0.1–1.0)
Monocytes Relative: 4 %
Neutro Abs: 9.5 10*3/uL — ABNORMAL HIGH (ref 1.7–7.7)
Neutrophils Relative %: 90 %
Platelets: 500 10*3/uL — ABNORMAL HIGH (ref 150–400)
RBC: 3.64 MIL/uL — ABNORMAL LOW (ref 4.22–5.81)
RDW: 15.3 % (ref 11.5–15.5)
WBC: 10.6 10*3/uL — ABNORMAL HIGH (ref 4.0–10.5)
nRBC: 0 % (ref 0.0–0.2)

## 2022-06-02 LAB — MAGNESIUM: Magnesium: 2 mg/dL (ref 1.7–2.4)

## 2022-06-02 MED ORDER — SODIUM CHLORIDE 0.9 % IV SOLN
40.0000 mg/m2 | Freq: Once | INTRAVENOUS | Status: AC
  Administered 2022-06-02: 77 mg via INTRAVENOUS
  Filled 2022-06-02: qty 77

## 2022-06-02 MED ORDER — PALONOSETRON HCL INJECTION 0.25 MG/5ML
0.2500 mg | Freq: Once | INTRAVENOUS | Status: AC
  Administered 2022-06-02: 0.25 mg via INTRAVENOUS
  Filled 2022-06-02: qty 5

## 2022-06-02 MED ORDER — SODIUM CHLORIDE 0.9 % IV SOLN
10.0000 mg | Freq: Once | INTRAVENOUS | Status: AC
  Administered 2022-06-02: 10 mg via INTRAVENOUS
  Filled 2022-06-02: qty 10

## 2022-06-02 MED ORDER — SODIUM CHLORIDE 0.9 % IV SOLN
10.0000 mg | Freq: Once | INTRAVENOUS | Status: DC
Start: 2022-06-02 — End: 2022-06-02

## 2022-06-02 MED ORDER — SODIUM CHLORIDE 0.9% FLUSH
10.0000 mL | INTRAVENOUS | Status: DC | PRN
  Administered 2022-06-02: 10 mL

## 2022-06-02 MED ORDER — HEPARIN SOD (PORK) LOCK FLUSH 100 UNIT/ML IV SOLN
500.0000 [IU] | Freq: Once | INTRAVENOUS | Status: AC | PRN
  Administered 2022-06-02: 500 [IU]

## 2022-06-02 MED ORDER — SODIUM CHLORIDE 0.9 % IV SOLN: Freq: Once | INTRAVENOUS | Status: AC

## 2022-06-02 MED ORDER — POTASSIUM CHLORIDE IN NACL 20-0.9 MEQ/L-% IV SOLN
Freq: Once | INTRAVENOUS | Status: AC
  Filled 2022-06-02: qty 1000

## 2022-06-02 MED ORDER — MAGNESIUM SULFATE 2 GM/50ML IV SOLN
2.0000 g | Freq: Once | INTRAVENOUS | Status: AC
  Administered 2022-06-02: 2 g via INTRAVENOUS
  Filled 2022-06-02: qty 50

## 2022-06-02 MED ORDER — ALUM & MAG HYDROXIDE-SIMETH 200-200-20 MG/5ML PO SUSP
30.0000 mL | Freq: Once | ORAL | Status: AC
  Administered 2022-06-02: 30 mL via ORAL
  Filled 2022-06-02: qty 30

## 2022-06-02 NOTE — Progress Notes (Signed)
Per Lillard Anes, okay to proceed with pre-hydration pending Mag level.

## 2022-06-02 NOTE — Progress Notes (Unsigned)
Nutrition Follow-up:  Patient with tonsil cancer. Patient has refused G-tube placement. He is receiving concurrent chemotherapy and radiation.   Met with patient in infusion. He reports good appetite despite diminished taste. Patient says he is sure to snack often. States he brought in snacks for everyone today. Patient had bacon, eggs, and grits for breakfast. He was unable to taste this, but ate all of it. Recalls 2 bowls of ramen for dinner and brownies for dessert. He is drinking 3-4 Ensure. Patient prefers chocolate and requesting samples. He reports thick saliva and mild sore throat. He is drinking 2 bottles of water as well as juice. Patient is doing baking soda salt water rinses. He denies nausea, vomiting, diarrhea, constipation.    Medications: reviewed  Labs: Na 127, BUN 29  Anthropometrics: Wt 149 lb 2 oz today - stable x 3 weeks  5/10 - 148 lb 13 oz 5/6 - 148 lb 12.8 oz 5/2 - 154 lb    NUTRITION DIAGNOSIS: Unintentional weight loss stable    INTERVENTION:  Continue strategies for increasing calories and protein with small frequent meals and snacks Continue drinking 3-4 Ensure Plus HP/equivalent One case Ensure Plus HP provided - suggested adding chocolate syrup to create preferred flavor + adding to chocolate ice cream for high calorie shake (pt liked these ideas) Discussed importance of hydration - pt will work to increase this  Suggested trying sprite/ginger ale rinse for thick saliva Continue baking soda salt water rinses. Encouraged trying before meals for altered taste    MONITORING, EVALUATION, GOAL: weight trends, intake   NEXT VISIT: Friday May 31 during infusion

## 2022-06-02 NOTE — Assessment & Plan Note (Signed)
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  Stage III squamous cell carcinoma: His neck mass is improving he continues on oxycodone which has allowed him to stay functional with his pain.  He denies any significant side effects from this such as somnolence or constipation. Dehydration: He received fluids before his treatment.  A magnesium level was not drawn today which is in the chemo orders.  I requested this be drawn and entered an additional order. Severe protein calorie malnutrition: His weight has been stable this week as compared to last.  He continues to see nutrition weekly.  He did eat a good meal this morning. Indigestion: He will receive Maalox today in infusion.  He will return next week for labs, follow-up, and chemotherapy will continue on daily radiation.

## 2022-06-02 NOTE — Progress Notes (Signed)
[facility] Cancer Follow up:    Pcp, No No address on file   DIAGNOSIS:  Cancer Staging  Cancer of tonsillar fossa (HCC) Staging form: Pharynx - HPV-Mediated Oropharynx, AJCC 8th Edition - Clinical stage from 04/25/2022: Stage III (cT3, cN3, cM0, p16+) - Unsigned Stage prefix: Initial diagnosis   SUMMARY OF ONCOLOGIC HISTORY: Oncology History  Cancer of tonsillar fossa (HCC)  02/2022 Initial Diagnosis   The patient initially presented to a provider located in [place] this past February for evaluation of a new right neck mass and sore throat. He was subsequently referred to ENT in [place] and had a soft tissue neck CT performed on 03/03/22 which demonstrated a large left neck mass and a tonsillar mass. Involvement of the left soft palate and possible invasion of the left tongue base were also appreciated. The scanned report is partially unreadable due to low quality, however findings detailed a a 6.1 cm mass-like area of matted left level 2 and level 3 lymph nodes with areas of necrosis, and a 6.2 cm fatty neoplasm posterior to the right sternocleidomastoid muscle.    03/15/2022 Initial Biopsy   Biopsy of the left neck mass: metastatic squamous cell carcinoma; p16 positive.    04/28/2022 PET scan   IMPRESSION: 1. 3.6 by 2.5 by 4.8 cm left tonsillar mass extending into the epiglottis, maximum SUV 13.9, compatible with malignancy. 2. Bulky conglomerate left level IIA adenopathy is hypermetabolic and compatible with malignancy. 3. 5.0 by 2.7 cm fatty mass along the superficial fascia margin of the right posterior neck, with internal stranding and mild internal nodularity. Low-grade associated activity, maximum SUV 1.4. Possibilities include low-grade liposarcoma and atypical lipoma. 4. Hypermetabolic activity along an exophytic density in the vicinity of the distal anal canal and perianal region, maximum SUV 8.6. Exophytic anal mass such as squamous cell carcinoma  not excluded. Visual inspection and digital rectal exam recommended for further assessment. 5. Severe right degenerative glenohumeral arthropathy with free osteochondral fragment in the subscapular recess of the joint and a smaller free fragment in the biceps recess of the joint, with suspected right glenohumeral joint effusion. There is also severe asymmetric degenerative right hip arthropathy. Mesoacromial os acromiale bilaterally, with associated degenerative findings on the right side. 6. Aortic atherosclerosis. 7. Emphysema.   Aortic Atherosclerosis (ICD10-I70.0) and Emphysema (ICD10-J43.9).     Electronically Signed   By: Gaylyn Rong M.D.   On: 04/28/2022 10:26   05/25/2022 -  Chemotherapy   Patient is on Treatment Plan : HEAD/NECK Cisplatin (40) q7d       CURRENT THERAPY: Cisplatin/concurrent radiation  INTERVAL HISTORY: Jozef Zahnow 62 y.o. male returns for follow up of his head and neck cancer receiving chemoradiation.  He tells me that he is doing moderately well.  He ate grits, bacon, and eggs this morning and is now experiencing indigestion.  He is requesting some medicaltion to help alleviate this.    He tells me that he is swallowing ok and he is working on his intake.  He denies any fever or chills.  He has pain in his left neck from his tumor and the oxycodone PRN helps relieve his pain.   He is seeing nutrition today.     Patient Active Problem List   Diagnosis Date Noted   Port-A-Cath in place 05/25/2022   Cancer of tonsillar fossa (HCC) 04/25/2022   SCC (squamous cell carcinoma) 04/24/2022   Normocytic anemia 04/24/2022   Chronic back pain 02/24/2020   Ileus (HCC) 03/24/2019  MDD (major depressive disorder), recurrent episode (HCC) 04/20/2018   Chronic right-sided low back pain 04/16/2018   Weakness of right lower extremity 04/16/2018   Healthcare maintenance 02/04/2018   Blood loss anemia 01/28/2018   Rectal bleeding 01/28/2018   Alcohol  dependence with alcohol-induced mood disorder (HCC)    Major depressive disorder, recurrent severe without psychotic features (HCC) 08/01/2017   Dysphagia 07/08/2017   Stenosis of cervical spine with myelopathy (HCC) 07/06/2017   Left-sided weakness 06/27/2017   Tricompartment osteoarthritis of right knee 03/27/2017   Alcohol abuse with alcohol-induced mood disorder (HCC) 09/25/2016   Polysubstance dependence (HCC) 09/25/2016   Substance use disorder 09/19/2016   MDD (major depressive disorder), recurrent severe, without psychosis (HCC) 09/18/2016   Cocaine abuse (HCC) 07/27/2015   Suicidal ideation 07/27/2015   Cocaine dependence with intoxication with complication (HCC) 07/23/2015   Feeling like committing suicide 07/23/2015   Metabolic acidosis 07/23/2015   Tobacco abuse 07/23/2015   Substance induced mood disorder (HCC) 07/19/2015   Bipolar 1 disorder, depressed (HCC) 07/18/2015   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   Hemorrhoids    HIV (human immunodeficiency virus infection) (HCC)    Stenosis of cervical spine    withb myelopathy    SURGICAL HISTORY: Past Surgical History:  Procedure Laterality Date   ANTERIOR CERVICAL DECOMP/DISCECTOMY FUSION N/A 07/06/2017   Procedure: ANTERIOR CERVICAL DECOMPRESSION/DISCECTOMY FUSION C3-4/C4-5 2 LEVELS;  Surgeon: Lisbeth Renshaw, MD;  Location: [facility] OR;  Service: Neurosurgery;  Laterality: N/A;   HERNIA REPAIR  97   lt ing   IR IMAGING GUIDED PORT INSERTION  05/19/2022   RECTAL EXAM UNDER ANESTHESIA N/A 04/24/2014   Procedure:  ligation of bleeding vessels;  Surgeon: Almond Lint, MD;  Location: [facility];  Service: General;  Laterality: N/A;   TENDON REPAIR     2006-lt index   WART FULGURATION  07/06/2011   Procedure: FULGURATION ANAL WART;   Surgeon: Shelly Rubenstein, MD;  Location: [facility];  Service: General;  Laterality: N/A;  excision anal condyloma   WART FULGURATION N/A 04/23/2014   Procedure: EXCISION OF ANAL CONDYLOMA;  Surgeon: Abigail Miyamoto, MD;  Location: [facility];  Service: General;  Laterality: N/A;    SOCIAL HISTORY: Social History   Socioeconomic History   Marital status: Single    Spouse name: Not on file   Number of children: Not on file   Years of education: Not on file   Highest education level: Not on file  Occupational History   Occupation: Unemployed  Tobacco Use   Smoking status: Every Day    Packs/day: 0.50    Years: 25.00    Additional pack years: 0.00    Total pack years: 12.50    Types: Cigarettes   Smokeless tobacco: Never  Vaping Use   Vaping Use: Never used  Substance and Sexual Activity   Alcohol use: Yes    Alcohol/week: 24.0 standard drinks of alcohol    Types: 24 Cans of beer per week    Comment: ''I can drink a case a day''   Drug use: Yes    Types: "Crack" cocaine    Comment: pt reports last use was Wednesday   Sexual activity: Not Currently    Comment: pt declined condoms 09/22/19  Other Topics Concern   Not on file  Social History Narrative   Pt lives alone; receives Tree surgeon.   Social Determinants of Health   Financial Resource Strain: Not on file  Food Insecurity: Not on file  Transportation Needs: Not on file  Physical Activity: Not on file  Stress: Not on file  Social Connections: Not on file  Intimate Partner Violence: Not on file    FAMILY HISTORY: Family History  Problem Relation Age of Onset   Cancer Mother        brain   Cancer Sister        breast    Review of Systems  Constitutional:  Positive for fatigue. Negative for appetite change, chills, fever and unexpected weight change.  HENT:   Negative for hearing loss, lump/mass, mouth sores and trouble swallowing.   Eyes:  Negative for eye problems and icterus.   Respiratory:  Negative for chest tightness, cough and shortness of breath.   Cardiovascular:  Negative for chest pain, leg swelling and palpitations.  Gastrointestinal:  Negative for abdominal distention, abdominal pain, constipation, diarrhea, nausea and vomiting.  Endocrine: Negative for hot flashes.  Genitourinary:  Negative for difficulty urinating.   Musculoskeletal:  Negative for arthralgias.  Skin:  Negative for itching and rash.  Neurological:  Negative for dizziness, extremity weakness, headaches and numbness.  Hematological:  Negative for adenopathy. Does not bruise/bleed easily.  Psychiatric/Behavioral:  Negative for depression. The patient is not nervous/anxious.       PHYSICAL EXAMINATION   Onc Performance Status - 06/02/22 0900       ECOG Perf Status   ECOG Perf Status Ambulatory and capable of all selfcare but unable to carry out any work activities.  Up and about more than 50% of waking hours             Vitals:   06/02/22 0918  BP: 133/64  Pulse: 80  Resp: 19  Temp: 98.1 F (36.7 C)  SpO2: 100%    Physical Exam Constitutional:      General: He is not in acute distress.    Appearance: Normal appearance. He is not toxic-appearing.  HENT:     Head: Normocephalic and atraumatic.     Mouth/Throat:     Mouth: Mucous membranes are moist.     Pharynx: Oropharynx is clear. No oropharyngeal exudate or posterior oropharyngeal erythema.  Eyes:     General: No scleral icterus. Cardiovascular:     Rate and Rhythm: Normal rate and regular rhythm.     Pulses: Normal pulses.     Heart sounds: Normal heart sounds.  Pulmonary:     Effort: Pulmonary effort is normal.     Breath sounds: Normal breath sounds.  Abdominal:     General: Abdomen is flat. Bowel sounds are normal. There is no distension.     Palpations: Abdomen is soft.     Tenderness: There is no abdominal tenderness.  Musculoskeletal:        General: No swelling.     Cervical back: Neck supple.   Lymphadenopathy:     Cervical: No cervical adenopathy.  Skin:    General: Skin is warm and dry.     Findings: No rash.  Neurological:     General: No focal deficit present.     Mental Status: He is alert.  Psychiatric:        Mood and Affect: Mood normal.        Behavior: Behavior normal.     LABORATORY DATA:  CBC    Component Value  Date/Time   WBC 10.6 (H) 06/02/2022 0815   RBC 3.64 (L) 06/02/2022 0815   HGB 9.9 (L) 06/02/2022 0815   HGB 9.7 (L) 05/25/2022 0910   HCT 28.9 (L) 06/02/2022 0815   PLT 500 (H) 06/02/2022 0815   PLT 618 (H) 05/25/2022 0910   MCV 79.4 (L) 06/02/2022 0815   MCH 27.2 06/02/2022 0815   MCHC 34.3 06/02/2022 0815   RDW 15.3 06/02/2022 0815   LYMPHSABS 0.1 (L) 06/02/2022 0815   MONOABS 0.4 06/02/2022 0815   EOSABS 0.0 06/02/2022 0815   BASOSABS 0.0 06/02/2022 0815    CMP     Component Value Date/Time   NA 127 (L) 06/02/2022 0815   K 4.3 06/02/2022 0815   CL 91 (L) 06/02/2022 0815   CO2 27 06/02/2022 0815   GLUCOSE 118 (H) 06/02/2022 0815   BUN 29 (H) 06/02/2022 0815   CREATININE 1.17 06/02/2022 0815   CREATININE 0.97 05/25/2022 0910   CREATININE 1.07 03/01/2021 1649   CALCIUM 8.9 06/02/2022 0815   PROT 6.4 (L) 06/02/2022 0815   ALBUMIN 3.8 06/02/2022 0815   AST 39 06/02/2022 0815   ALT 49 (H) 06/02/2022 0815   ALKPHOS 45 06/02/2022 0815   BILITOT 0.5 06/02/2022 0815   GFRNONAA >60 06/02/2022 0815   GFRNONAA >60 05/25/2022 0910   GFRNONAA 86 09/22/2019 1121   GFRAA 99 09/22/2019 1121    ASSESSMENT and THERAPY PLAN:   Cancer of tonsillar fossa (HCC) Jasim is a 62 year old man with 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  Stage III squamous cell carcinoma: His neck mass is improving he continues on oxycodone which has allowed him to stay functional with his pain.  He denies any significant side effects from this such as somnolence or  constipation. Dehydration: He received fluids before his treatment.  A magnesium level was not drawn today which is in the chemo orders.  I requested this be drawn and entered an additional order. Severe protein calorie malnutrition: His weight has been stable this week as compared to last.  He continues to see nutrition weekly.  He did eat a good meal this morning. Indigestion: He will receive Maalox today in infusion.  He will return next week for labs, follow-up, and chemotherapy will continue on daily radiation.  All questions were answered. The patient knows to call the clinic with any problems, questions or concerns. We can certainly see the patient much sooner if necessary.  Total encounter time:30 minutes*in face-to-face visit time, chart review, lab review, care coordination, order entry, and documentation of the encounter time.    Lillard Anes, NP 06/02/22 9:50 AM Medical Oncology and Hematology [facility] [address] [place], [place] [address] Tel. [phone]    Fax. [phone]  *Total Encounter Time as defined by the Centers for Medicare and Medicaid Services includes, in addition to the face-to-face time of a patient visit (documented in the note above) non-face-to-face time: obtaining and reviewing outside history, ordering and reviewing medications, tests or procedures, care coordination (communications with other health care professionals or caregivers) and documentation in the medical record.

## 2022-06-02 NOTE — Patient Instructions (Signed)
[facility]  Discharge Instructions: Thank you for choosing [facility] to provide your oncology and hematology care.   If you have a lab appointment with the Cancer Center, please go directly to the Cancer Center and check in at the registration area.   Wear comfortable clothing and clothing appropriate for easy access to any Portacath or PICC line.   We strive to give you quality time with your provider. You may need to reschedule your appointment if you arrive late (15 or more minutes).  Arriving late affects you and other patients whose appointments are after yours.  Also, if you miss three or more appointments without notifying the office, you may be dismissed from the clinic at the provider's discretion.      For prescription refill requests, have your pharmacy contact our office and allow 72 hours for refills to be completed.    Today you received the following chemotherapy and/or immunotherapy agents Cisplatin      To help prevent nausea and vomiting after your treatment, we encourage you to take your nausea medication as directed.  BELOW ARE SYMPTOMS THAT SHOULD BE REPORTED IMMEDIATELY: *FEVER GREATER THAN 100.4 F (38 C) OR HIGHER *CHILLS OR SWEATING *NAUSEA AND VOMITING THAT IS NOT CONTROLLED WITH YOUR NAUSEA MEDICATION *UNUSUAL SHORTNESS OF BREATH *UNUSUAL BRUISING OR BLEEDING *URINARY PROBLEMS (pain or burning when urinating, or frequent urination) *BOWEL PROBLEMS (unusual diarrhea, constipation, pain near the anus) TENDERNESS IN MOUTH AND THROAT WITH OR WITHOUT PRESENCE OF ULCERS (sore throat, sores in mouth, or a toothache) UNUSUAL RASH, SWELLING OR PAIN  UNUSUAL VAGINAL DISCHARGE OR ITCHING   Items with * indicate a potential emergency and should be followed up as soon as possible or go to the Emergency Department if any problems should occur.  Please show the CHEMOTHERAPY ALERT CARD or IMMUNOTHERAPY ALERT CARD at  check-in to the Emergency Department and triage nurse.  Should you have questions after your visit or need to cancel or reschedule your appointment, please contact [facility]  Dept: [phone]  and follow the prompts.  Office hours are 8:00 a.m. to 4:30 p.m. Monday - Friday. Please note that voicemails left after 4:00 p.m. may not be returned until the following business day.  We are closed weekends and major holidays. You have access to a nurse at all times for urgent questions. Please call the main number to the clinic Dept: [phone] and follow the prompts.   For any non-urgent questions, you may also contact your provider using MyChart. We now offer e-Visits for anyone 18 and older to request care online for non-urgent symptoms. For details visit [email or web address].   Also download the MyChart app! Go to the app store, search "MyChart", open the app, select [facility], and log in with your MyChart username and password.   Cisplatin Injection What is this medication? CISPLATIN (SIS pla tin) treats some types of cancer. It works by slowing down the growth of cancer cells. This medicine may be used for other purposes; ask your health care provider or pharmacist if you have questions. COMMON BRAND NAME(S): Platinol, Platinol -AQ What should I tell my care team before I take this medication? They need to know if you have any of these conditions: Eye disease, vision problems Hearing problems Kidney disease Low blood counts, such as low white cells, platelets, or red blood cells Tingling of the fingers or toes, or other nerve disorder An   unusual or allergic reaction to cisplatin, carboplatin, oxaliplatin, other medications, foods, dyes, or preservatives If you or your partner are pregnant or trying to get pregnant Breast-feeding How should I use this medication? This medication is injected into a vein. It is given by your care team in a hospital  or clinic setting. Talk to your care team about the use of this medication in children. Special care may be needed. Overdosage: If you think you have taken too much of this medicine contact a poison control center or emergency room at once. NOTE: This medicine is only for you. Do not share this medicine with others. What if I miss a dose? Keep appointments for follow-up doses. It is important not to miss your dose. Call your care team if you are unable to keep an appointment. What may interact with this medication? Do not take this medication with any of the following: Live virus vaccines This medication may also interact with the following: Certain antibiotics, such as amikacin, gentamicin, neomycin, polymyxin B, streptomycin, tobramycin, vancomycin Foscarnet This list may not describe all possible interactions. Give your health care provider a list of all the medicines, herbs, non-prescription drugs, or dietary supplements you use. Also tell them if you smoke, drink alcohol, or use illegal drugs. Some items may interact with your medicine. What should I watch for while using this medication? Your condition will be monitored carefully while you are receiving this medication. You may need blood work done while taking this medication. This medication may make you feel generally unwell. This is not uncommon, as chemotherapy can affect healthy cells as well as cancer cells. Report any side effects. Continue your course of treatment even though you feel ill unless your care team tells you to stop. This medication may increase your risk of getting an infection. Call your care team for advice if you get a fever, chills, sore throat, or other symptoms of a cold or flu. Do not treat yourself. Try to avoid being around people who are sick. Avoid taking medications that contain aspirin, acetaminophen, ibuprofen, naproxen, or ketoprofen unless instructed by your care team. These medications may hide a  fever. This medication may increase your risk to bruise or bleed. Call your care team if you notice any unusual bleeding. Be careful brushing or flossing your teeth or using a toothpick because you may get an infection or bleed more easily. If you have any dental work done, tell your dentist you are receiving this medication. Drink fluids as directed while you are taking this medication. This will help protect your kidneys. Call your care team if you get diarrhea. Do not treat yourself. Talk to your care team if you or your partner wish to become pregnant or think you might be pregnant. This medication can cause serious birth defects if taken during pregnancy and for 14 months after the last dose. A negative pregnancy test is required before starting this medication. A reliable form of contraception is recommended while taking this medication and for 14 months after the last dose. Talk to your care team about effective forms of contraception. Do not father a child while taking this medication and for 11 months after the last dose. Use a condom during sex during this time period. Do not breast-feed while taking this medication. This medication may cause infertility. Talk to your care team if you are concerned about your fertility. What side effects may I notice from receiving this medication? Side effects that you should report to your   care team as soon as possible: Allergic reactions--skin rash, itching, hives, swelling of the face, lips, tongue, or throat Eye pain, change in vision, vision loss Hearing loss, ringing in ears Infection--fever, chills, cough, sore throat, wounds that don't heal, pain or trouble when passing urine, general feeling of discomfort or being unwell Kidney injury--decrease in the amount of urine, swelling of the ankles, hands, or feet Low red blood cell level--unusual weakness or fatigue, dizziness, headache, trouble breathing Painful swelling, warmth, or redness of the skin,  blisters or sores at the infusion site Pain, tingling, or numbness in the hands or feet Unusual bruising or bleeding Side effects that usually do not require medical attention (report to your care team if they continue or are bothersome): Hair loss Nausea Vomiting This list may not describe all possible side effects. Call your doctor for medical advice about side effects. You may report side effects to FDA at 1-800-FDA-1088. Where should I keep my medication? This medication is given in a hospital or clinic. It will not be stored at home. NOTE: This sheet is a summary. It may not cover all possible information. If you have questions about this medicine, talk to your doctor, pharmacist, or health care provider.  2023 Elsevier/Gold Standard (2021-04-22 00:00:00)  

## 2022-06-05 ENCOUNTER — Encounter: Payer: Self-pay | Admitting: Hematology and Oncology

## 2022-06-05 ENCOUNTER — Encounter: Payer: Self-pay | Admitting: Adult Health

## 2022-06-06 ENCOUNTER — Inpatient Hospital Stay: Payer: Medicare Other

## 2022-06-06 ENCOUNTER — Other Ambulatory Visit: Payer: Self-pay

## 2022-06-06 ENCOUNTER — Ambulatory Visit
Admission: RE | Admit: 2022-06-06 | Discharge: 2022-06-06 | Disposition: A | Discharge status: Home / Self Care | Payer: Medicare Other | Source: Ambulatory Visit | Attending: Radiation Oncology | Admitting: Radiation Oncology

## 2022-06-06 ENCOUNTER — Encounter (HOSPITAL_COMMUNITY): Payer: Self-pay

## 2022-06-06 ENCOUNTER — Emergency Department (HOSPITAL_COMMUNITY): Payer: Medicare Other

## 2022-06-06 ENCOUNTER — Other Ambulatory Visit: Payer: Self-pay | Admitting: Radiation Oncology

## 2022-06-06 ENCOUNTER — Emergency Department (HOSPITAL_COMMUNITY)
Admission: EM | Admit: 2022-06-06 | Discharge: 2022-06-06 | Disposition: A | Discharge status: Home / Self Care | Payer: Medicare Other | Attending: Emergency Medicine | Admitting: Emergency Medicine

## 2022-06-06 ENCOUNTER — Other Ambulatory Visit: Payer: Self-pay | Admitting: Adult Health

## 2022-06-06 DIAGNOSIS — W232XXA Caught, crushed, jammed or pinched between a moving and stationary object, initial encounter: Secondary | ICD-10-CM | POA: Diagnosis not present

## 2022-06-06 DIAGNOSIS — M79672 Pain in left foot: Secondary | ICD-10-CM | POA: Diagnosis present

## 2022-06-06 DIAGNOSIS — R339 Retention of urine, unspecified: Secondary | ICD-10-CM

## 2022-06-06 DIAGNOSIS — F332 Major depressive disorder, recurrent severe without psychotic features: Secondary | ICD-10-CM

## 2022-06-06 DIAGNOSIS — Z0000 Encounter for general adult medical examination without abnormal findings: Secondary | ICD-10-CM

## 2022-06-06 DIAGNOSIS — C4492 Squamous cell carcinoma of skin, unspecified: Secondary | ICD-10-CM

## 2022-06-06 DIAGNOSIS — B20 Human immunodeficiency virus [HIV] disease: Secondary | ICD-10-CM

## 2022-06-06 DIAGNOSIS — Z720 Tobacco use: Secondary | ICD-10-CM

## 2022-06-06 DIAGNOSIS — C090 Malignant neoplasm of tonsillar fossa: Secondary | ICD-10-CM

## 2022-06-06 DIAGNOSIS — G893 Neoplasm related pain (acute) (chronic): Secondary | ICD-10-CM

## 2022-06-06 DIAGNOSIS — S9032XA Contusion of left foot, initial encounter: Secondary | ICD-10-CM | POA: Diagnosis not present

## 2022-06-06 DIAGNOSIS — F339 Major depressive disorder, recurrent, unspecified: Secondary | ICD-10-CM

## 2022-06-06 DIAGNOSIS — Z510 Encounter for antineoplastic radiation therapy: Secondary | ICD-10-CM | POA: Diagnosis not present

## 2022-06-06 LAB — CMP (CANCER CENTER ONLY)
ALT: 51 U/L — ABNORMAL HIGH (ref 0–44)
AST: 42 U/L — ABNORMAL HIGH (ref 15–41)
Albumin: 4 g/dL (ref 3.5–5.0)
Alkaline Phosphatase: 44 U/L (ref 38–126)
Anion gap: 7 (ref 5–15)
BUN: 33 mg/dL — ABNORMAL HIGH (ref 8–23)
CO2: 30 mmol/L (ref 22–32)
Calcium: 9 mg/dL (ref 8.9–10.3)
Chloride: 88 mmol/L — ABNORMAL LOW (ref 98–111)
Creatinine: 1.2 mg/dL (ref 0.61–1.24)
GFR, Estimated: >60 mL/min (ref >60)
Glucose, Bld: 102 mg/dL — ABNORMAL HIGH (ref 70–99)
Potassium: 4.8 mmol/L (ref 3.5–5.1)
Sodium: 125 mmol/L — ABNORMAL LOW (ref 135–145)
Total Bilirubin: 0.7 mg/dL (ref 0.3–1.2)
Total Protein: 6.3 g/dL — ABNORMAL LOW (ref 6.5–8.1)

## 2022-06-06 LAB — RAD ONC ARIA SESSION SUMMARY
Course Elapsed Days: 29
Plan Fractions Treated to Date: 17
Plan Prescribed Dose Per Fraction: 2 Gy
Plan Total Fractions Prescribed: 35
Plan Total Prescribed Dose: 70 Gy
Reference Point Dosage Given to Date: 34 Gy
Reference Point Session Dosage Given: 2 Gy
Session Number: 17

## 2022-06-06 LAB — URINALYSIS, COMPLETE (UACMP) WITH MICROSCOPIC
Bacteria, UA: NONE SEEN
Bilirubin Urine: NEGATIVE
Glucose, UA: NEGATIVE mg/dL
Hgb urine dipstick: NEGATIVE
Ketones, ur: NEGATIVE mg/dL
Leukocytes,Ua: NEGATIVE
Nitrite: NEGATIVE
Protein, ur: NEGATIVE mg/dL
Specific Gravity, Urine: 1.011 (ref 1.005–1.030)
pH: 6 (ref 5.0–8.0)

## 2022-06-06 LAB — CBC WITH DIFFERENTIAL (CANCER CENTER ONLY)
Abs Immature Granulocytes: 0.07 10*3/uL (ref 0.00–0.07)
Basophils Absolute: 0 10*3/uL (ref 0.0–0.1)
Basophils Relative: 0 %
Eosinophils Absolute: 0 10*3/uL (ref 0.0–0.5)
Eosinophils Relative: 0 %
HCT: 29 % — ABNORMAL LOW (ref 39.0–52.0)
Hemoglobin: 9.9 g/dL — ABNORMAL LOW (ref 13.0–17.0)
Immature Granulocytes: 1 %
Lymphocytes Relative: 1 %
Lymphs Abs: 0 10*3/uL — ABNORMAL LOW (ref 0.7–4.0)
MCH: 27.5 pg (ref 26.0–34.0)
MCHC: 34.1 g/dL (ref 30.0–36.0)
MCV: 80.6 fL (ref 80.0–100.0)
Monocytes Absolute: 0.3 10*3/uL (ref 0.1–1.0)
Monocytes Relative: 5 %
Neutro Abs: 6.8 10*3/uL (ref 1.7–7.7)
Neutrophils Relative %: 93 %
Platelet Count: 305 10*3/uL (ref 150–400)
RBC: 3.6 MIL/uL — ABNORMAL LOW (ref 4.22–5.81)
RDW: 16.1 % — ABNORMAL HIGH (ref 11.5–15.5)
WBC Count: 7.2 10*3/uL (ref 4.0–10.5)
nRBC: 0.4 % — ABNORMAL HIGH (ref 0.0–0.2)

## 2022-06-06 LAB — RAPID URINE DRUG SCREEN, HOSP PERFORMED
Amphetamines: NOT DETECTED
Barbiturates: NOT DETECTED
Benzodiazepines: NOT DETECTED
Cocaine: NOT DETECTED
Opiates: NOT DETECTED
Tetrahydrocannabinol: NOT DETECTED

## 2022-06-06 LAB — MAGNESIUM: Magnesium: 2.1 mg/dL (ref 1.7–2.4)

## 2022-06-06 MED ORDER — LOSARTAN POTASSIUM 25 MG PO TABS
25.0000 mg | ORAL_TABLET | Freq: Every day | ORAL | 0 refills | Status: AC
Start: 2022-06-06 — End: ?

## 2022-06-06 MED ORDER — OXYCODONE HCL 5 MG PO TABS
5.0000 mg | ORAL_TABLET | ORAL | 0 refills | Status: DC | PRN
Start: 2022-06-06 — End: 2022-06-13

## 2022-06-06 MED ORDER — BUPROPION HCL ER (XL) 300 MG PO TB24
300.0000 mg | ORAL_TABLET | Freq: Every day | ORAL | 0 refills | Status: DC
Start: 2022-06-06 — End: 2022-08-03

## 2022-06-06 NOTE — Discharge Instructions (Signed)
Return for any problem.  ?
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
## 2022-06-07 ENCOUNTER — Inpatient Hospital Stay: Payer: Medicare Other

## 2022-06-07 ENCOUNTER — Other Ambulatory Visit: Payer: Self-pay

## 2022-06-07 ENCOUNTER — Ambulatory Visit
Admission: RE | Admit: 2022-06-07 | Discharge: 2022-06-07 | Disposition: A | Discharge status: Home / Self Care | Payer: Medicare Other | Source: Ambulatory Visit | Attending: Radiation Oncology | Admitting: Radiation Oncology

## 2022-06-07 ENCOUNTER — Other Ambulatory Visit: Payer: Self-pay | Admitting: Physician Assistant

## 2022-06-07 ENCOUNTER — Telehealth: Payer: Self-pay

## 2022-06-07 ENCOUNTER — Inpatient Hospital Stay: Payer: Medicare Other | Admitting: Licensed Clinical Social Worker

## 2022-06-07 VITALS — BP 128/72 | HR 73 | Resp 17

## 2022-06-07 DIAGNOSIS — M7989 Other specified soft tissue disorders: Secondary | ICD-10-CM

## 2022-06-07 DIAGNOSIS — Z510 Encounter for antineoplastic radiation therapy: Secondary | ICD-10-CM | POA: Diagnosis not present

## 2022-06-07 DIAGNOSIS — C4492 Squamous cell carcinoma of skin, unspecified: Secondary | ICD-10-CM

## 2022-06-07 DIAGNOSIS — E860 Dehydration: Secondary | ICD-10-CM

## 2022-06-07 DIAGNOSIS — Z95828 Presence of other vascular implants and grafts: Secondary | ICD-10-CM

## 2022-06-07 LAB — RAD ONC ARIA SESSION SUMMARY
Course Elapsed Days: 30
Plan Fractions Treated to Date: 18
Plan Prescribed Dose Per Fraction: 2 Gy
Plan Total Fractions Prescribed: 35
Plan Total Prescribed Dose: 70 Gy
Reference Point Dosage Given to Date: 36 Gy
Reference Point Session Dosage Given: 2 Gy
Session Number: 18

## 2022-06-07 LAB — URINE CULTURE: Culture: <10000 — AB

## 2022-06-07 MED ORDER — SODIUM CHLORIDE 0.9 % IV SOLN: INTRAVENOUS | Status: DC

## 2022-06-07 MED ORDER — SODIUM CHLORIDE 0.9% FLUSH
10.0000 mL | Freq: Once | INTRAVENOUS | Status: AC
  Administered 2022-06-07: 10 mL

## 2022-06-07 MED ORDER — HEPARIN SOD (PORK) LOCK FLUSH 100 UNIT/ML IV SOLN
500.0000 [IU] | Freq: Once | INTRAVENOUS | Status: AC
  Administered 2022-06-07: 500 [IU]

## 2022-06-07 NOTE — Patient Instructions (Signed)
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
## 2022-06-07 NOTE — Telephone Encounter (Signed)
Per NP, call placed to pt to offer IVF. He accepted and will come in today after rxt.

## 2022-06-07 NOTE — Progress Notes (Signed)
Patient reported new RLE to RN after receiving IVF today. He is unable to stay for ultrasound today, however is returning tomorrow for radiation. DVT study ordered  with plan to obtain after radiation due to transportation issues. Patient will follow up in [facility] afterwards to discuss results. Brief exam shows non pitting RLE, compartments are soft and DP pulse 2+. Patient denies SOB or CP. Discussed symptoms that would warrant sooner ED evaluation. Patient agreeable with plan.
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
## 2022-06-08 ENCOUNTER — Ambulatory Visit
Admission: RE | Admit: 2022-06-08 | Discharge: 2022-06-08 | Disposition: A | Discharge status: Home / Self Care | Payer: Medicare Other | Source: Ambulatory Visit | Attending: Radiation Oncology | Admitting: Radiation Oncology

## 2022-06-08 ENCOUNTER — Encounter (HOSPITAL_COMMUNITY): Payer: Medicare Other

## 2022-06-08 ENCOUNTER — Inpatient Hospital Stay: Payer: Medicare Other | Admitting: Physician Assistant

## 2022-06-08 ENCOUNTER — Telehealth: Payer: Self-pay

## 2022-06-08 ENCOUNTER — Other Ambulatory Visit: Payer: Self-pay

## 2022-06-08 ENCOUNTER — Inpatient Hospital Stay: Payer: Medicare Other

## 2022-06-08 ENCOUNTER — Ambulatory Visit (HOSPITAL_COMMUNITY): Admission: RE | Admit: 2022-06-08 | Payer: Medicare Other | Source: Ambulatory Visit

## 2022-06-08 DIAGNOSIS — Z510 Encounter for antineoplastic radiation therapy: Secondary | ICD-10-CM | POA: Diagnosis not present

## 2022-06-08 LAB — RAD ONC ARIA SESSION SUMMARY
Course Elapsed Days: 31
Plan Fractions Treated to Date: 19
Plan Prescribed Dose Per Fraction: 2 Gy
Plan Total Fractions Prescribed: 35
Plan Total Prescribed Dose: 70 Gy
Reference Point Dosage Given to Date: 38 Gy
Reference Point Session Dosage Given: 2 Gy
Session Number: 19

## 2022-06-08 MED FILL — Dexamethasone Sodium Phosphate Inj 100 MG/10ML: INTRAMUSCULAR | Qty: 1 | Status: AC

## 2022-06-08 NOTE — Telephone Encounter (Signed)
This RN called patient regarding missed DVT study appointment for today at 0900. No answer, left voicemail.

## 2022-06-09 ENCOUNTER — Inpatient Hospital Stay: Payer: Medicare Other

## 2022-06-09 ENCOUNTER — Telehealth: Payer: Self-pay | Admitting: *Deleted

## 2022-06-09 ENCOUNTER — Telehealth: Payer: Self-pay

## 2022-06-09 ENCOUNTER — Inpatient Hospital Stay: Payer: Medicare Other | Admitting: Licensed Clinical Social Worker

## 2022-06-09 ENCOUNTER — Inpatient Hospital Stay (HOSPITAL_BASED_OUTPATIENT_CLINIC_OR_DEPARTMENT_OTHER): Payer: Medicare Other | Admitting: Adult Health

## 2022-06-09 ENCOUNTER — Ambulatory Visit
Admission: RE | Admit: 2022-06-09 | Discharge: 2022-06-09 | Disposition: A | Discharge status: Home / Self Care | Payer: Medicare Other | Source: Ambulatory Visit | Attending: Radiation Oncology | Admitting: Radiation Oncology

## 2022-06-09 ENCOUNTER — Other Ambulatory Visit: Payer: Self-pay

## 2022-06-09 ENCOUNTER — Inpatient Hospital Stay: Payer: Medicare Other | Admitting: Dietician

## 2022-06-09 ENCOUNTER — Ambulatory Visit (HOSPITAL_COMMUNITY): Admission: RE | Admit: 2022-06-09 | Payer: Medicare Other | Source: Ambulatory Visit

## 2022-06-09 VITALS — BP 120/73 | HR 84 | Resp 17

## 2022-06-09 DIAGNOSIS — C090 Malignant neoplasm of tonsillar fossa: Secondary | ICD-10-CM | POA: Diagnosis not present

## 2022-06-09 DIAGNOSIS — C4492 Squamous cell carcinoma of skin, unspecified: Secondary | ICD-10-CM | POA: Diagnosis not present

## 2022-06-09 DIAGNOSIS — Z95828 Presence of other vascular implants and grafts: Secondary | ICD-10-CM

## 2022-06-09 DIAGNOSIS — Z510 Encounter for antineoplastic radiation therapy: Secondary | ICD-10-CM | POA: Diagnosis not present

## 2022-06-09 LAB — CBC WITH DIFFERENTIAL (CANCER CENTER ONLY)
Abs Immature Granulocytes: 0.14 10*3/uL — ABNORMAL HIGH (ref 0.00–0.07)
Basophils Absolute: 0 10*3/uL (ref 0.0–0.1)
Basophils Relative: 0 %
Eosinophils Absolute: 0 10*3/uL (ref 0.0–0.5)
Eosinophils Relative: 0 %
HCT: 30.4 % — ABNORMAL LOW (ref 39.0–52.0)
Hemoglobin: 10.2 g/dL — ABNORMAL LOW (ref 13.0–17.0)
Immature Granulocytes: 2 %
Lymphocytes Relative: 0 %
Lymphs Abs: 0 10*3/uL — ABNORMAL LOW (ref 0.7–4.0)
MCH: 27.3 pg (ref 26.0–34.0)
MCHC: 33.6 g/dL (ref 30.0–36.0)
MCV: 81.5 fL (ref 80.0–100.0)
Monocytes Absolute: 0.3 10*3/uL (ref 0.1–1.0)
Monocytes Relative: 3 %
Neutro Abs: 8.3 10*3/uL — ABNORMAL HIGH (ref 1.7–7.7)
Neutrophils Relative %: 95 %
Platelet Count: 218 10*3/uL (ref 150–400)
RBC: 3.73 MIL/uL — ABNORMAL LOW (ref 4.22–5.81)
RDW: 16.5 % — ABNORMAL HIGH (ref 11.5–15.5)
WBC Count: 8.7 10*3/uL (ref 4.0–10.5)
nRBC: 0 % (ref 0.0–0.2)

## 2022-06-09 LAB — BASIC METABOLIC PANEL - CANCER CENTER ONLY
Anion gap: 5 (ref 5–15)
BUN: 33 mg/dL — ABNORMAL HIGH (ref 8–23)
CO2: 29 mmol/L (ref 22–32)
Calcium: 8.8 mg/dL — ABNORMAL LOW (ref 8.9–10.3)
Chloride: 94 mmol/L — ABNORMAL LOW (ref 98–111)
Creatinine: 1.01 mg/dL (ref 0.61–1.24)
GFR, Estimated: >60 mL/min (ref >60)
Glucose, Bld: 116 mg/dL — ABNORMAL HIGH (ref 70–99)
Potassium: 4 mmol/L (ref 3.5–5.1)
Sodium: 128 mmol/L — ABNORMAL LOW (ref 135–145)

## 2022-06-09 LAB — RAD ONC ARIA SESSION SUMMARY
Course Elapsed Days: 32
Plan Fractions Treated to Date: 20
Plan Prescribed Dose Per Fraction: 2 Gy
Plan Total Fractions Prescribed: 35
Plan Total Prescribed Dose: 70 Gy
Reference Point Dosage Given to Date: 40 Gy
Reference Point Session Dosage Given: 2 Gy
Session Number: 20

## 2022-06-09 LAB — MAGNESIUM: Magnesium: 2.2 mg/dL (ref 1.7–2.4)

## 2022-06-09 MED ORDER — HEPARIN SOD (PORK) LOCK FLUSH 100 UNIT/ML IV SOLN
500.0000 [IU] | Freq: Once | INTRAVENOUS | Status: AC | PRN
  Administered 2022-06-09: 500 [IU]

## 2022-06-09 MED ORDER — SODIUM CHLORIDE 0.9% FLUSH
10.0000 mL | Freq: Once | INTRAVENOUS | Status: AC
  Administered 2022-06-09: 10 mL

## 2022-06-09 MED ORDER — SODIUM CHLORIDE 0.9 % IV SOLN
10.0000 mg | Freq: Once | INTRAVENOUS | Status: AC
  Administered 2022-06-09: 10 mg via INTRAVENOUS
  Filled 2022-06-09: qty 10

## 2022-06-09 MED ORDER — FLUCONAZOLE 100 MG PO TABS
ORAL_TABLET | ORAL | 0 refills | Status: AC
Start: 2022-06-09 — End: ?

## 2022-06-09 MED ORDER — SODIUM CHLORIDE 0.9 % IV SOLN
40.0000 mg/m2 | Freq: Once | INTRAVENOUS | Status: AC
  Administered 2022-06-09: 77 mg via INTRAVENOUS
  Filled 2022-06-09: qty 77

## 2022-06-09 MED ORDER — POTASSIUM CHLORIDE IN NACL 20-0.9 MEQ/L-% IV SOLN
Freq: Once | INTRAVENOUS | Status: AC
  Filled 2022-06-09: qty 1000

## 2022-06-09 MED ORDER — PALONOSETRON HCL INJECTION 0.25 MG/5ML
0.2500 mg | Freq: Once | INTRAVENOUS | Status: AC
  Administered 2022-06-09: 0.25 mg via INTRAVENOUS
  Filled 2022-06-09: qty 5

## 2022-06-09 MED ORDER — DEXAMETHASONE 4 MG PO TABS
ORAL_TABLET | ORAL | 1 refills | Status: DC
Start: 2022-06-09 — End: 2022-07-20

## 2022-06-09 MED ORDER — LIDOCAINE VISCOUS HCL 2 % MT SOLN
15.0000 mL | Freq: Once | OROMUCOSAL | Status: AC
  Administered 2022-06-09: 15 mL via OROMUCOSAL
  Filled 2022-06-09: qty 15

## 2022-06-09 MED ORDER — VALACYCLOVIR HCL 500 MG PO TABS
500.0000 mg | ORAL_TABLET | Freq: Two times a day (BID) | ORAL | 0 refills | Status: AC
Start: 2022-06-09 — End: ?

## 2022-06-09 MED ORDER — SODIUM CHLORIDE 0.9 % IV SOLN: Freq: Once | INTRAVENOUS | Status: AC

## 2022-06-09 MED ORDER — MAGNESIUM SULFATE 2 GM/50ML IV SOLN
2.0000 g | Freq: Once | INTRAVENOUS | Status: AC
  Administered 2022-06-09: 2 g via INTRAVENOUS
  Filled 2022-06-09: qty 50

## 2022-06-09 MED ORDER — SODIUM CHLORIDE 0.9% FLUSH
10.0000 mL | INTRAVENOUS | Status: DC | PRN
  Administered 2022-06-09: 10 mL

## 2022-06-09 MED ORDER — LIDOCAINE VISCOUS HCL 2 % MT SOLN
OROMUCOSAL | 3 refills | Status: DC
Start: 2022-06-09 — End: 2022-08-03

## 2022-06-09 NOTE — Telephone Encounter (Signed)
Pt scheduled for 1 L NS per NP 06/12/22 at 1030 with [facility] at [facility]. He knows to arrive at 1015 for check in.

## 2022-06-09 NOTE — Telephone Encounter (Signed)
-----   Message from Loa Socks, NP sent at 06/09/2022 10:53 AM EDT ----- Please see if we can get him set up for IV fluids on Monday ----- Message ----- From: Interface, Lab In [place] Sent: 06/09/2022  10:20 AM EDT To: Loa Socks, NP

## 2022-06-09 NOTE — Telephone Encounter (Signed)
May run hydration fluids with chemotherapy per compatibility.

## 2022-06-09 NOTE — Progress Notes (Signed)
[facility] CSW Progress Note  Visual merchandiser met with patient to follow-up on housing. Pt reports he has stayed at the hotel the last couple of nights and that has worked well. He is going to take the [employer] train to stay in [place] with his sister this weekend. Pt then reported that he plans to stay with her for the last 3 weeks of treatment and take the train from [place] to [place] daily for his treatments.  CSW re-iterated hotel option as daily travel may become taxing for him but pt stated he will be fine with it. Also encouraged pt to check his VM as he will be receiving a call from community agencies regarding applying for more long-term housing.  CSW will continue to check in with pt during remainder of treatment.    Evva Din E Nazim Kadlec, LCSW Clinical Social Worker [facility]

## 2022-06-09 NOTE — Progress Notes (Signed)
Per Dr. Al Pimple, OK to administer post-hydration IVF concurrently with cisplatin.

## 2022-06-09 NOTE — Patient Instructions (Signed)
[facility] [facility]  Discharge Instructions: Thank you for choosing [facility] to provide your oncology and hematology care.   If you have a lab appointment with the Cancer Center, please go directly to the Cancer Center and check in at the registration area.   Wear comfortable clothing and clothing appropriate for easy access to any Portacath or PICC line.   We strive to give you quality time with your provider. You may need to reschedule your appointment if you arrive late (15 or more minutes).  Arriving late affects you and other patients whose appointments are after yours.  Also, if you miss three or more appointments without notifying the office, you may be dismissed from the clinic at the provider's discretion.      For prescription refill requests, have your pharmacy contact our office and allow 72 hours for refills to be completed.    Today you received the following chemotherapy and/or immunotherapy agents Cisplatin      To help prevent nausea and vomiting after your treatment, we encourage you to take your nausea medication as directed.  BELOW ARE SYMPTOMS THAT SHOULD BE REPORTED IMMEDIATELY: *FEVER GREATER THAN 100.4 F (38 C) OR HIGHER *CHILLS OR SWEATING *NAUSEA AND VOMITING THAT IS NOT CONTROLLED WITH YOUR NAUSEA MEDICATION *UNUSUAL SHORTNESS OF BREATH *UNUSUAL BRUISING OR BLEEDING *URINARY PROBLEMS (pain or burning when urinating, or frequent urination) *BOWEL PROBLEMS (unusual diarrhea, constipation, pain near the anus) TENDERNESS IN MOUTH AND THROAT WITH OR WITHOUT PRESENCE OF ULCERS (sore throat, sores in mouth, or a toothache) UNUSUAL RASH, SWELLING OR PAIN  UNUSUAL VAGINAL DISCHARGE OR ITCHING   Items with * indicate a potential emergency and should be followed up as soon as possible or go to the Emergency Department if any problems should occur.  Please show the CHEMOTHERAPY ALERT CARD or IMMUNOTHERAPY ALERT CARD at  check-in to the Emergency Department and triage nurse.  Should you have questions after your visit or need to cancel or reschedule your appointment, please contact [facility]  Dept: [phone]  and follow the prompts.  Office hours are 8:00 a.m. to 4:30 p.m. Monday - Friday. Please note that voicemails left after 4:00 p.m. may not be returned until the following business day.  We are closed weekends and major holidays. You have access to a nurse at all times for urgent questions. Please call the main number to the clinic Dept: [phone] and follow the prompts.   For any non-urgent questions, you may also contact your provider using MyChart. We now offer e-Visits for anyone 18 and older to request care online for non-urgent symptoms. For details visit [email or web address].   Also download the MyChart app! Go to the app store, search "MyChart", open the app, select [facility], and log in with your MyChart username and password.   

## 2022-06-09 NOTE — Progress Notes (Signed)
DVT study rescheduled for 6/3 at 1pm. Patient made aware of date, time and location of appointment. Patient given ER precautions should he develop chest pain or shortness of breath in the interim. No further questions or concerns at this time.

## 2022-06-09 NOTE — Assessment & Plan Note (Signed)
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  1.  Stage III squamous cell carcinoma: He will continue on concurrent chemoradiation.  He will proceed with treatment today. 2.  Dehydration: Will receive prehydration and post hydration with his cisplatin today.  I sent my nurse a message to see if we can get him in on Monday for IV fluids as well.  He received IV fluids earlier this week and his sodium level has improved. 3.  Cancer related pain: I reviewed his urine drug screen that was not positive for opiates.  However he had not taken his oxycodone since the day prior.  The good thing is that he did not test positive for any street drugs.  He has his oxycodone in his book bag and tells me that he plans to bring it to every appointment so we can follow along and make sure he has his pills. 4.  Weight changes: He will continue to follow-up with our registered dietitian during his treatments.  He continues to drink Ensure with good tolerance. 5.  Oral ulcerations: I sent in Valtrex for him to take twice daily.  [name] will return in 1 week for labs and follow-up along with his next treatment.

## 2022-06-09 NOTE — Progress Notes (Signed)
[facility] Cancer Follow up:    Pcp, No No address on file   DIAGNOSIS:  Cancer Staging  Cancer of tonsillar fossa (HCC) Staging form: Pharynx - HPV-Mediated Oropharynx, AJCC 8th Edition - Clinical stage from 04/25/2022: Stage III (cT3, cN3, cM0, p16+) - Unsigned Stage prefix: Initial diagnosis   SUMMARY OF ONCOLOGIC HISTORY: Oncology History  Cancer of tonsillar fossa (HCC)  02/2022 Initial Diagnosis   The patient initially presented to a provider located in [place] this past February for evaluation of a new right neck mass and sore throat. He was subsequently referred to ENT in [place] and had a soft tissue neck CT performed on 03/03/22 which demonstrated a large left neck mass and a tonsillar mass. Involvement of the left soft palate and possible invasion of the left tongue base were also appreciated. The scanned report is partially unreadable due to low quality, however findings detailed a a 6.1 cm mass-like area of matted left level 2 and level 3 lymph nodes with areas of necrosis, and a 6.2 cm fatty neoplasm posterior to the right sternocleidomastoid muscle.    03/15/2022 Initial Biopsy   Biopsy of the left neck mass: metastatic squamous cell carcinoma; p16 positive.    04/28/2022 PET scan   IMPRESSION: 1. 3.6 by 2.5 by 4.8 cm left tonsillar mass extending into the epiglottis, maximum SUV 13.9, compatible with malignancy. 2. Bulky conglomerate left level IIA adenopathy is hypermetabolic and compatible with malignancy. 3. 5.0 by 2.7 cm fatty mass along the superficial fascia margin of the right posterior neck, with internal stranding and mild internal nodularity. Low-grade associated activity, maximum SUV 1.4. Possibilities include low-grade liposarcoma and atypical lipoma. 4. Hypermetabolic activity along an exophytic density in the vicinity of the distal anal canal and perianal region, maximum SUV 8.6. Exophytic anal mass such as squamous cell carcinoma  not excluded. Visual inspection and digital rectal exam recommended for further assessment. 5. Severe right degenerative glenohumeral arthropathy with free osteochondral fragment in the subscapular recess of the joint and a smaller free fragment in the biceps recess of the joint, with suspected right glenohumeral joint effusion. There is also severe asymmetric degenerative right hip arthropathy. Mesoacromial os acromiale bilaterally, with associated degenerative findings on the right side. 6. Aortic atherosclerosis. 7. Emphysema.   Aortic Atherosclerosis (ICD10-I70.0) and Emphysema (ICD10-J43.9).     Electronically Signed   By: Gaylyn Rong M.D.   On: 04/28/2022 10:26   05/25/2022 -  Chemotherapy   Patient is on Treatment Plan : HEAD/NECK Cisplatin (40) q7d       CURRENT THERAPY: concurrent chemoradiation  INTERVAL HISTORY: [name] 62 y.o. male returns for follow-up and evaluation while receiving concurrent chemoradiation.  He is tolerating this well.  This past week he moved out of his current housing situation and our social workers help get him set up at an extended stay hotel.    He continues to take oxycodone 5 mg 2 tablets every 4-6 hours as needed for his pain.  He brought all of his pills in with him today.  He had a full bottle of his oxycodone left that we have prescribed him earlier this week.  He does have some taste changes and pain in his oropharynx and is requesting a refill on his lidocaine.  He tells me that he continues to drink Ensure and today he is down 4 pounds from his weight last week.  Patient Active Problem List   Diagnosis Date Noted   Port-A-Cath in place  05/25/2022   Cancer of tonsillar fossa (HCC) 04/25/2022   SCC (squamous cell carcinoma) 04/24/2022   Normocytic anemia 04/24/2022   Chronic back pain 02/24/2020   Ileus (HCC) 03/24/2019   MDD (major depressive disorder), recurrent episode (HCC) 04/20/2018   Chronic right-sided low back  pain 04/16/2018   Weakness of right lower extremity 04/16/2018   Healthcare maintenance 02/04/2018   Blood loss anemia 01/28/2018   Rectal bleeding 01/28/2018   Alcohol dependence with alcohol-induced mood disorder (HCC)    Major depressive disorder, recurrent severe without psychotic features (HCC) 08/01/2017   Dysphagia 07/08/2017   Stenosis of cervical spine with myelopathy (HCC) 07/06/2017   Left-sided weakness 06/27/2017   Tricompartment osteoarthritis of right knee 03/27/2017   Alcohol abuse with alcohol-induced mood disorder (HCC) 09/25/2016   Polysubstance dependence (HCC) 09/25/2016   Substance use disorder 09/19/2016   MDD (major depressive disorder), recurrent severe, without psychosis (HCC) 09/18/2016   Cocaine abuse (HCC) 07/27/2015   Suicidal ideation 07/27/2015   Cocaine dependence with intoxication with complication (HCC) 07/23/2015   Feeling like committing suicide 07/23/2015   Metabolic acidosis 07/23/2015   Tobacco abuse 07/23/2015   Substance induced mood disorder (HCC) 07/19/2015   Bipolar 1 disorder, depressed (HCC) 07/18/2015   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   Hemorrhoids    HIV (human immunodeficiency virus infection) (HCC)    Stenosis of cervical spine    withb myelopathy    SURGICAL HISTORY: Past Surgical History:  Procedure Laterality Date   ANTERIOR CERVICAL DECOMP/DISCECTOMY FUSION N/A 07/06/2017   Procedure: ANTERIOR CERVICAL DECOMPRESSION/DISCECTOMY FUSION C3-4/C4-5 2 LEVELS;  Surgeon: Lisbeth Renshaw, MD;  Location: [facility] OR;  Service: Neurosurgery;  Laterality: N/A;   HERNIA REPAIR  97   lt ing   IR IMAGING GUIDED PORT INSERTION  05/19/2022   RECTAL EXAM UNDER ANESTHESIA N/A 04/24/2014   Procedure:  ligation of bleeding vessels;  Surgeon: Almond Lint, MD;  Location: [facility];  Service: General;  Laterality: N/A;   TENDON REPAIR     2006-lt index   WART FULGURATION  07/06/2011   Procedure: FULGURATION ANAL WART;  Surgeon: Shelly Rubenstein, MD;  Location: [facility];  Service: General;  Laterality: N/A;  excision anal condyloma   WART FULGURATION N/A 04/23/2014   Procedure: EXCISION OF ANAL CONDYLOMA;  Surgeon: Abigail Miyamoto, MD;  Location: [facility];  Service: General;  Laterality: N/A;    SOCIAL HISTORY: Social History   Socioeconomic History   Marital status: Single    Spouse name: Not on file   Number of children: Not on file   Years of education: Not on file   Highest education level: Not on file  Occupational History   Occupation: Unemployed  Tobacco Use   Smoking status: Every Day    Packs/day: 0.50    Years: 25.00    Additional pack years: 0.00    Total pack years: 12.50    Types: Cigarettes   Smokeless tobacco: Never  Vaping Use   Vaping Use: Never used  Substance and Sexual Activity   Alcohol use: Yes    Alcohol/week: 24.0 standard drinks of alcohol    Types: 24 Cans of beer per week    Comment: ''I can drink a case a day''   Drug use: Yes    Types: "Crack"  cocaine    Comment: pt reports last use was Wednesday   Sexual activity: Not Currently    Comment: pt declined condoms 09/22/19  Other Topics Concern   Not on file  Social History Narrative   Pt lives alone; receives Tree surgeon.   Social Determinants of Health   Financial Resource Strain: Not on file  Food Insecurity: Not on file  Transportation Needs: Not on file  Physical Activity: Not on file  Stress: Not on file  Social Connections: Not on file  Intimate Partner Violence: Not on file    FAMILY HISTORY: Family History  Problem Relation Age of Onset   Cancer Mother        brain   Cancer Sister        breast    Review of Systems  Constitutional:  Negative for appetite change, chills, fatigue, fever and  unexpected weight change.  HENT:   Positive for mouth sores and sore throat. Negative for hearing loss, lump/mass and trouble swallowing.   Eyes:  Negative for eye problems and icterus.  Respiratory:  Negative for chest tightness, cough and shortness of breath.   Cardiovascular:  Negative for chest pain, leg swelling and palpitations.  Gastrointestinal:  Negative for abdominal distention, abdominal pain, constipation, diarrhea, nausea and vomiting.  Endocrine: Negative for hot flashes.  Genitourinary:  Negative for difficulty urinating.   Musculoskeletal:  Negative for arthralgias.  Skin:  Negative for itching and rash.  Neurological:  Negative for dizziness, extremity weakness, headaches and numbness.  Hematological:  Negative for adenopathy. Does not bruise/bleed easily.  Psychiatric/Behavioral:  Negative for depression. The patient is not nervous/anxious.       PHYSICAL EXAMINATION    Vitals:   06/09/22 1035  BP: 123/65  Pulse: 76  Resp: 18  Temp: 97.8 F (36.6 C)  SpO2: 100%    Physical Exam Constitutional:      General: He is not in acute distress.    Appearance: Normal appearance. He is not toxic-appearing.  HENT:     Head: Normocephalic and atraumatic.     Mouth/Throat:     Mouth: Mucous membranes are moist.     Pharynx: No oropharyngeal exudate or posterior oropharyngeal erythema.     Comments: Multiple ulcerations noted in his posterior oropharynx. Eyes:     General: No scleral icterus. Cardiovascular:     Rate and Rhythm: Normal rate and regular rhythm.     Pulses: Normal pulses.     Heart sounds: Normal heart sounds.  Pulmonary:     Effort: Pulmonary effort is normal.     Breath sounds: Normal breath sounds.  Abdominal:     General: Abdomen is flat. Bowel sounds are normal. There is no distension.     Palpations: Abdomen is soft.     Tenderness: There is no abdominal tenderness.  Musculoskeletal:        General: No swelling.     Cervical back: Neck  supple.  Lymphadenopathy:     Cervical: Cervical adenopathy present.  Skin:    General: Skin is warm and dry.     Findings: No rash.  Neurological:     General: No focal deficit present.     Mental Status: He is alert.  Psychiatric:        Mood and Affect: Mood normal.        Behavior: Behavior normal.     LABORATORY DATA:  CBC    Component Value Date/Time   WBC 8.7 06/09/2022 0906  WBC 10.6 (H) 06/02/2022 0815   RBC 3.73 (L) 06/09/2022 0906   HGB 10.2 (L) 06/09/2022 0906   HCT 30.4 (L) 06/09/2022 0906   PLT 218 06/09/2022 0906   MCV 81.5 06/09/2022 0906   MCH 27.3 06/09/2022 0906   MCHC 33.6 06/09/2022 0906   RDW 16.5 (H) 06/09/2022 0906   LYMPHSABS 0.0 (L) 06/09/2022 0906   MONOABS 0.3 06/09/2022 0906   EOSABS 0.0 06/09/2022 0906   BASOSABS 0.0 06/09/2022 0906    CMP     Component Value Date/Time   NA 128 (L) 06/09/2022 0906   K 4.0 06/09/2022 0906   CL 94 (L) 06/09/2022 0906   CO2 29 06/09/2022 0906   GLUCOSE 116 (H) 06/09/2022 0906   BUN 33 (H) 06/09/2022 0906   CREATININE 1.01 06/09/2022 0906   CREATININE 1.07 03/01/2021 1649   CALCIUM 8.8 (L) 06/09/2022 0906   PROT 6.3 (L) 06/06/2022 1522   ALBUMIN 4.0 06/06/2022 1522   AST 42 (H) 06/06/2022 1522   ALT 51 (H) 06/06/2022 1522   ALKPHOS 44 06/06/2022 1522   BILITOT 0.7 06/06/2022 1522   GFRNONAA >60 06/09/2022 0906   GFRNONAA 86 09/22/2019 1121   GFRAA 99 09/22/2019 1121       ASSESSMENT and THERAPY PLAN:   Cancer of tonsillar fossa (HCC) [name] is a 63 year old man with 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  1.  Stage III squamous cell carcinoma: He will continue on concurrent chemoradiation.  He will proceed with treatment today. 2.  Dehydration: Will receive prehydration and post hydration with his cisplatin today.  I sent my nurse a message to see if we can get him in on Monday for IV  fluids as well.  He received IV fluids earlier this week and his sodium level has improved. 3.  Cancer related pain: I reviewed his urine drug screen that was not positive for opiates.  However he had not taken his oxycodone since the day prior.  The good thing is that he did not test positive for any street drugs.  He has his oxycodone in his book bag and tells me that he plans to bring it to every appointment so we can follow along and make sure he has his pills. 4.  Weight changes: He will continue to follow-up with our registered dietitian during his treatments.  He continues to drink Ensure with good tolerance. 5.  Oral ulcerations: I sent in Valtrex for him to take twice daily.  [name] will return in 1 week for labs and follow-up along with his next treatment.   All questions were answered. The patient knows to call the clinic with any problems, questions or concerns. We can certainly see the patient much sooner if necessary.  Total encounter time:30 minutes*in face-to-face visit time, chart review, lab review, care coordination, order entry, and documentation of the encounter time.    Lillard Anes, NP 06/09/22 10:59 AM Medical Oncology and Hematology [facility] [address] [place], [place] [address] Tel. [phone]    Fax. [phone]  *Total Encounter Time as defined by the Centers for Medicare and Medicaid Services includes, in addition to the face-to-face time of a patient visit (documented in the note above) non-face-to-face time: obtaining and reviewing outside history, ordering and reviewing medications, tests or procedures, care coordination (communications with other health care professionals or caregivers) and documentation in the medical record.

## 2022-06-09 NOTE — Progress Notes (Signed)
Nutrition Follow-up:  Patient with tonsil cancer. Patient has refused G-tube placement. He is receiving concurrent chemotherapy and radiation.   Met with patient in infusion. He continues to have altered taste. Recalls 2 packages beef ramen and 3 Ensure/Boost. Patient is snacking on honey buns and double stuff Oreo's. He has brought snacks as well as ramen with him today. Pt request RD to heat this for him. He also requested large coffee with lots of sugar. He can taste the sugar if enough is in there.  Patient is drinking 1-2 bottles of water. States he knows he needs to drink more. Patient reports mild sore throat. He plans to go to pharmacy after treatment to pick up lidocaine. He denies nausea, vomiting, diarrhea, constipation.   Medications: xylocaine, diflucan, valtrex  Labs: Na 128, glucose 116, BUN 33  Anthropometrics: Wt 145 lb 4.8 oz today decreased   5/24 - 149 lb 2 oz 5/10 - 148 lb 13 oz 5/2 - 154 lb   NUTRITION DIAGNOSIS: Unintended weight loss continues    INTERVENTION:  Reinforced importance of adequate calorie and protein energy intake to maintain weights/strength during treatment Continue soft moist textures for ease of intake, suggested pt have glass of whole fat milk with snacks + offered suggestions on high calorie/high protein soft foods (pudding, ice cream, creamy based soups) Continue drinking Ensure Plus HP/equivalent, recommend increasing 4-5/day given wt loss Reinforced importance of hydration, recommend 4-5 bottles  Snack ideas + recommendations written down for patient    MONITORING, EVALUATION, GOAL: weight trends, intake   NEXT VISIT: Friday June 7 during infusion

## 2022-06-12 ENCOUNTER — Ambulatory Visit (HOSPITAL_BASED_OUTPATIENT_CLINIC_OR_DEPARTMENT_OTHER)
Admission: RE | Admit: 2022-06-12 | Discharge: 2022-06-12 | Disposition: A | Discharge status: Home / Self Care | Payer: Medicare Other | Source: Ambulatory Visit | Attending: Physician Assistant | Admitting: Physician Assistant

## 2022-06-12 ENCOUNTER — Inpatient Hospital Stay: Payer: Medicare Other

## 2022-06-12 ENCOUNTER — Other Ambulatory Visit: Payer: Self-pay

## 2022-06-12 ENCOUNTER — Ambulatory Visit
Admission: RE | Admit: 2022-06-12 | Discharge: 2022-06-12 | Disposition: A | Discharge status: Home / Self Care | Payer: Medicare Other | Source: Ambulatory Visit | Attending: Radiation Oncology | Admitting: Radiation Oncology

## 2022-06-12 VITALS — BP 117/77 | HR 75 | Temp 98.5°F | Resp 16

## 2022-06-12 DIAGNOSIS — D72819 Decreased white blood cell count, unspecified: Secondary | ICD-10-CM | POA: Insufficient documentation

## 2022-06-12 DIAGNOSIS — E860 Dehydration: Secondary | ICD-10-CM | POA: Insufficient documentation

## 2022-06-12 DIAGNOSIS — I700 Atherosclerosis of aorta: Secondary | ICD-10-CM | POA: Insufficient documentation

## 2022-06-12 DIAGNOSIS — Z452 Encounter for adjustment and management of vascular access device: Secondary | ICD-10-CM | POA: Insufficient documentation

## 2022-06-12 DIAGNOSIS — D649 Anemia, unspecified: Secondary | ICD-10-CM | POA: Insufficient documentation

## 2022-06-12 DIAGNOSIS — C4492 Squamous cell carcinoma of skin, unspecified: Secondary | ICD-10-CM

## 2022-06-12 DIAGNOSIS — C090 Malignant neoplasm of tonsillar fossa: Secondary | ICD-10-CM | POA: Insufficient documentation

## 2022-06-12 DIAGNOSIS — R339 Retention of urine, unspecified: Secondary | ICD-10-CM | POA: Diagnosis not present

## 2022-06-12 DIAGNOSIS — Z510 Encounter for antineoplastic radiation therapy: Secondary | ICD-10-CM | POA: Diagnosis not present

## 2022-06-12 DIAGNOSIS — M7989 Other specified soft tissue disorders: Secondary | ICD-10-CM | POA: Insufficient documentation

## 2022-06-12 DIAGNOSIS — K5900 Constipation, unspecified: Secondary | ICD-10-CM | POA: Insufficient documentation

## 2022-06-12 DIAGNOSIS — G893 Neoplasm related pain (acute) (chronic): Secondary | ICD-10-CM | POA: Insufficient documentation

## 2022-06-12 DIAGNOSIS — J439 Emphysema, unspecified: Secondary | ICD-10-CM | POA: Insufficient documentation

## 2022-06-12 DIAGNOSIS — F17210 Nicotine dependence, cigarettes, uncomplicated: Secondary | ICD-10-CM | POA: Diagnosis not present

## 2022-06-12 DIAGNOSIS — Z95828 Presence of other vascular implants and grafts: Secondary | ICD-10-CM

## 2022-06-12 DIAGNOSIS — K5903 Drug induced constipation: Secondary | ICD-10-CM | POA: Insufficient documentation

## 2022-06-12 LAB — RAD ONC ARIA SESSION SUMMARY
Course Elapsed Days: 35
Plan Fractions Treated to Date: 21
Plan Prescribed Dose Per Fraction: 2 Gy
Plan Total Fractions Prescribed: 35
Plan Total Prescribed Dose: 70 Gy
Reference Point Dosage Given to Date: 42 Gy
Reference Point Session Dosage Given: 2 Gy
Session Number: 21

## 2022-06-12 MED ORDER — HEPARIN SOD (PORK) LOCK FLUSH 100 UNIT/ML IV SOLN
500.0000 [IU] | Freq: Once | INTRAVENOUS | Status: AC
  Administered 2022-06-12: 500 [IU]

## 2022-06-12 MED ORDER — RADIAPLEXRX EX GEL: Freq: Once | CUTANEOUS | Status: AC

## 2022-06-12 MED ORDER — SODIUM CHLORIDE 0.9 % IV SOLN: INTRAVENOUS | Status: DC

## 2022-06-12 MED ORDER — SODIUM CHLORIDE 0.9% FLUSH
10.0000 mL | Freq: Once | INTRAVENOUS | Status: AC
  Administered 2022-06-12: 10 mL

## 2022-06-12 NOTE — Progress Notes (Signed)
Patient asked if this RN could assess his elbow abrasion from a recent fall. Small abrasion noted, approximately 1cm diameter circle. Upon closer inspection, the abrasion was noted to have a pinhole appearing depth with which subcutaneous and muscular structure could be visualized. The wound was not bleeding but was weeping some clear fluid from the "hole". Patient denies severe pain or fevers. Namon Cirri, PA-C to assess patient. It was recommended for patient to follow up with urgent care, which he declined. Wound was cleaned and dressed with Vaseline gauze, nonstick dressing and gauze wrap. Patient was advised to clean wound with soap and water. Patient was strongly advised to go to the ER if he develops pain out of proportion to the injury or a fever, indicating infection- he verbalized thanks and understanding.

## 2022-06-13 ENCOUNTER — Inpatient Hospital Stay: Payer: Medicare Other

## 2022-06-13 ENCOUNTER — Other Ambulatory Visit: Payer: Self-pay | Admitting: Adult Health

## 2022-06-13 ENCOUNTER — Encounter: Payer: Self-pay | Admitting: Radiation Oncology

## 2022-06-13 ENCOUNTER — Ambulatory Visit
Admission: RE | Admit: 2022-06-13 | Discharge: 2022-06-13 | Disposition: A | Discharge status: Home / Self Care | Payer: Medicare Other | Source: Ambulatory Visit | Attending: Radiation Oncology | Admitting: Radiation Oncology

## 2022-06-13 ENCOUNTER — Other Ambulatory Visit: Payer: Self-pay

## 2022-06-13 VITALS — BP 133/81 | HR 75 | Temp 98.0°F | Resp 18

## 2022-06-13 DIAGNOSIS — E860 Dehydration: Secondary | ICD-10-CM

## 2022-06-13 DIAGNOSIS — G893 Neoplasm related pain (acute) (chronic): Secondary | ICD-10-CM

## 2022-06-13 DIAGNOSIS — Z510 Encounter for antineoplastic radiation therapy: Secondary | ICD-10-CM | POA: Diagnosis not present

## 2022-06-13 DIAGNOSIS — C090 Malignant neoplasm of tonsillar fossa: Secondary | ICD-10-CM

## 2022-06-13 DIAGNOSIS — Z95828 Presence of other vascular implants and grafts: Secondary | ICD-10-CM

## 2022-06-13 LAB — RAD ONC ARIA SESSION SUMMARY
Course Elapsed Days: 36
Plan Fractions Treated to Date: 22
Plan Prescribed Dose Per Fraction: 2 Gy
Plan Total Fractions Prescribed: 35
Plan Total Prescribed Dose: 70 Gy
Reference Point Dosage Given to Date: 44 Gy
Reference Point Session Dosage Given: 2 Gy
Session Number: 22

## 2022-06-13 LAB — BASIC METABOLIC PANEL - CANCER CENTER ONLY
Anion gap: 5 (ref 5–15)
BUN: 21 mg/dL (ref 8–23)
CO2: 30 mmol/L (ref 22–32)
Calcium: 8.7 mg/dL — ABNORMAL LOW (ref 8.9–10.3)
Chloride: 93 mmol/L — ABNORMAL LOW (ref 98–111)
Creatinine: 0.92 mg/dL (ref 0.61–1.24)
GFR, Estimated: >60 mL/min (ref >60)
Glucose, Bld: 88 mg/dL (ref 70–99)
Potassium: 4.5 mmol/L (ref 3.5–5.1)
Sodium: 128 mmol/L — ABNORMAL LOW (ref 135–145)

## 2022-06-13 LAB — MAGNESIUM: Magnesium: 1.9 mg/dL (ref 1.7–2.4)

## 2022-06-13 MED ORDER — OXYCODONE HCL 5 MG PO TABS
5.0000 mg | ORAL_TABLET | ORAL | 0 refills | Status: DC | PRN
Start: 2022-06-13 — End: 2022-06-22

## 2022-06-13 MED ORDER — SODIUM CHLORIDE 0.9% FLUSH
10.0000 mL | Freq: Once | INTRAVENOUS | Status: AC
  Administered 2022-06-13: 10 mL

## 2022-06-13 MED ORDER — HEPARIN SOD (PORK) LOCK FLUSH 100 UNIT/ML IV SOLN
500.0000 [IU] | Freq: Once | INTRAVENOUS | Status: AC
  Administered 2022-06-13: 500 [IU]

## 2022-06-13 NOTE — Progress Notes (Signed)
Refilling Oxycodone per patient request.  Patient reviewed with radiation oncology RN yesterday who also confirmed remaining pill count.  PMP aware reviewed and no red flags noted.  Patient is taking oxycodone for cancer related pain.  He is able to remain functional with his pain regimen.  He denies constipation.  He denies increased somnolence.    Oxycodone #60 sent to his pharmacy.    Lillard Anes, NP 06/13/22 8:57 AM Medical Oncology and Hematology [facility] [address] [place], [place] [address] Tel. [phone]    Fax. [phone]

## 2022-06-13 NOTE — Progress Notes (Signed)
Today we performed a resimulation of Mr. Lehr as he has significant anatomic changes.  His mask was not fitting snugly any longer and his set up was not optimal.  Replanning will take place to account for his anatomic changes. -----------------------------------  Lonie Peak, MD

## 2022-06-14 ENCOUNTER — Ambulatory Visit
Admission: RE | Admit: 2022-06-14 | Discharge: 2022-06-14 | Disposition: A | Discharge status: Home / Self Care | Payer: Medicare Other | Source: Ambulatory Visit | Attending: Radiation Oncology | Admitting: Radiation Oncology

## 2022-06-14 ENCOUNTER — Inpatient Hospital Stay: Payer: Medicare Other

## 2022-06-14 ENCOUNTER — Other Ambulatory Visit: Payer: Self-pay

## 2022-06-14 DIAGNOSIS — Z510 Encounter for antineoplastic radiation therapy: Secondary | ICD-10-CM | POA: Diagnosis not present

## 2022-06-14 LAB — RAD ONC ARIA SESSION SUMMARY
Course Elapsed Days: 37
Plan Fractions Treated to Date: 1
Plan Prescribed Dose Per Fraction: 2 Gy
Plan Total Fractions Prescribed: 13
Plan Total Prescribed Dose: 26 Gy
Reference Point Dosage Given to Date: 2 Gy
Reference Point Session Dosage Given: 2 Gy
Session Number: 23

## 2022-06-15 ENCOUNTER — Other Ambulatory Visit: Payer: Medicare Other

## 2022-06-15 ENCOUNTER — Inpatient Hospital Stay: Payer: Medicare Other

## 2022-06-15 ENCOUNTER — Other Ambulatory Visit (HOSPITAL_COMMUNITY): Payer: Self-pay

## 2022-06-15 ENCOUNTER — Inpatient Hospital Stay (HOSPITAL_BASED_OUTPATIENT_CLINIC_OR_DEPARTMENT_OTHER): Payer: Medicare Other | Admitting: Adult Health

## 2022-06-15 ENCOUNTER — Encounter: Payer: Self-pay | Admitting: Hematology and Oncology

## 2022-06-15 ENCOUNTER — Ambulatory Visit
Admission: RE | Admit: 2022-06-15 | Discharge: 2022-06-15 | Disposition: A | Discharge status: Home / Self Care | Payer: Medicare Other | Source: Ambulatory Visit | Attending: Radiation Oncology | Admitting: Radiation Oncology

## 2022-06-15 ENCOUNTER — Other Ambulatory Visit: Payer: Self-pay

## 2022-06-15 ENCOUNTER — Encounter: Payer: Self-pay | Admitting: Adult Health

## 2022-06-15 DIAGNOSIS — C090 Malignant neoplasm of tonsillar fossa: Secondary | ICD-10-CM | POA: Diagnosis not present

## 2022-06-15 DIAGNOSIS — Z510 Encounter for antineoplastic radiation therapy: Secondary | ICD-10-CM | POA: Diagnosis not present

## 2022-06-15 DIAGNOSIS — Z95828 Presence of other vascular implants and grafts: Secondary | ICD-10-CM

## 2022-06-15 LAB — CBC WITH DIFFERENTIAL (CANCER CENTER ONLY)
Abs Immature Granulocytes: 0.04 10*3/uL (ref 0.00–0.07)
Basophils Absolute: 0 10*3/uL (ref 0.0–0.1)
Basophils Relative: 1 %
Eosinophils Absolute: 0 10*3/uL (ref 0.0–0.5)
Eosinophils Relative: 0 %
HCT: 27.4 % — ABNORMAL LOW (ref 39.0–52.0)
Hemoglobin: 9.5 g/dL — ABNORMAL LOW (ref 13.0–17.0)
Immature Granulocytes: 2 %
Lymphocytes Relative: 3 %
Lymphs Abs: 0.1 10*3/uL — ABNORMAL LOW (ref 0.7–4.0)
MCH: 28.4 pg (ref 26.0–34.0)
MCHC: 34.7 g/dL (ref 30.0–36.0)
MCV: 81.8 fL (ref 80.0–100.0)
Monocytes Absolute: 0.1 10*3/uL (ref 0.1–1.0)
Monocytes Relative: 5 %
Neutro Abs: 1.8 10*3/uL (ref 1.7–7.7)
Neutrophils Relative %: 89 %
Platelet Count: 104 10*3/uL — ABNORMAL LOW (ref 150–400)
RBC: 3.35 MIL/uL — ABNORMAL LOW (ref 4.22–5.81)
RDW: 17.4 % — ABNORMAL HIGH (ref 11.5–15.5)
Smear Review: DECREASED
WBC Count: 2 10*3/uL — ABNORMAL LOW (ref 4.0–10.5)
nRBC: 0 % (ref 0.0–0.2)

## 2022-06-15 LAB — RAD ONC ARIA SESSION SUMMARY
Course Elapsed Days: 38
Plan Fractions Treated to Date: 2
Plan Prescribed Dose Per Fraction: 2 Gy
Plan Total Fractions Prescribed: 13
Plan Total Prescribed Dose: 26 Gy
Reference Point Dosage Given to Date: 4 Gy
Reference Point Session Dosage Given: 2 Gy
Session Number: 24

## 2022-06-15 LAB — MAGNESIUM: Magnesium: 1.8 mg/dL (ref 1.7–2.4)

## 2022-06-15 LAB — BASIC METABOLIC PANEL - CANCER CENTER ONLY
Anion gap: 5 (ref 5–15)
BUN: 21 mg/dL (ref 8–23)
CO2: 29 mmol/L (ref 22–32)
Calcium: 8.3 mg/dL — ABNORMAL LOW (ref 8.9–10.3)
Chloride: 93 mmol/L — ABNORMAL LOW (ref 98–111)
Creatinine: 1 mg/dL (ref 0.61–1.24)
GFR, Estimated: >60 mL/min (ref >60)
Glucose, Bld: 98 mg/dL (ref 70–99)
Potassium: 4 mmol/L (ref 3.5–5.1)
Sodium: 127 mmol/L — ABNORMAL LOW (ref 135–145)

## 2022-06-15 MED ORDER — POLYETHYLENE GLYCOL 3350 17 GM/SCOOP PO POWD
ORAL | 3 refills | Status: AC
Start: 2022-06-15 — End: ?
  Filled 2022-06-15: qty 238, 14d supply, fill #0

## 2022-06-15 MED ORDER — HEPARIN SOD (PORK) LOCK FLUSH 100 UNIT/ML IV SOLN
500.0000 [IU] | Freq: Once | INTRAVENOUS | Status: AC
  Administered 2022-06-15: 500 [IU]

## 2022-06-15 MED ORDER — SENNOSIDES-DOCUSATE SODIUM 8.6-50 MG PO TABS
1.0000 | ORAL_TABLET | Freq: Two times a day (BID) | ORAL | 0 refills | Status: AC
Start: 2022-06-15 — End: ?
  Filled 2022-06-15: qty 100, 50d supply, fill #0

## 2022-06-15 MED ORDER — SODIUM CHLORIDE 0.9% FLUSH
10.0000 mL | Freq: Once | INTRAVENOUS | Status: AC
  Administered 2022-06-15: 10 mL

## 2022-06-15 MED FILL — Dexamethasone Sodium Phosphate Inj 100 MG/10ML: INTRAMUSCULAR | Qty: 1 | Status: AC

## 2022-06-15 NOTE — Progress Notes (Signed)
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 He was subsequently referred to ENT in [place] and had a soft tissue neck CT performed on 03/03/22 which demonstrated a large left neck mass and a tonsillar mass. Involvement of the left soft palate and possible invasion of the left tongue base were also appreciated. The scanned report is partially unreadable due to low quality, however findings detailed a a 6.1 cm mass-like area of matted left level 2 and level 3 lymph nodes with areas of necrosis, and a 6.2 cm fatty neoplasm posterior to the right sternocleidomastoid muscle.    03/15/2022 Initial Biopsy   Biopsy of the left neck mass: metastatic squamous cell carcinoma; p16 positive.    04/28/2022 PET scan   IMPRESSION: 1. 3.6 by 2.5 by 4.8 cm left tonsillar mass extending into the epiglottis, maximum SUV 13.9, compatible with malignancy. 2. Bulky conglomerate left level IIA adenopathy is hypermetabolic and compatible with malignancy. 3. 5.0 by 2.7 cm fatty mass along the superficial fascia margin of the right posterior neck, with internal stranding and mild internal nodularity. Low-grade associated activity, maximum SUV 1.4. Possibilities include low-grade liposarcoma and atypical lipoma. 4. Hypermetabolic activity along an exophytic density in the vicinity of the distal anal canal and perianal region, maximum SUV 8.6. Exophytic anal mass such as squamous cell carcinoma  not excluded. Visual inspection and digital rectal exam recommended for further assessment. 5. Severe right degenerative glenohumeral arthropathy with free osteochondral fragment in the subscapular recess of the joint and a smaller free fragment in the biceps recess of the joint, with suspected right glenohumeral joint effusion. There is also severe asymmetric degenerative right hip arthropathy. Mesoacromial os acromiale bilaterally, with associated degenerative findings on the right side. 6. Aortic atherosclerosis. 7. Emphysema.   Aortic Atherosclerosis (ICD10-I70.0) and Emphysema (ICD10-J43.9).     Electronically Signed   By: Gaylyn Rong M.D.   On: 04/28/2022 10:26   05/25/2022 -  Chemotherapy   Patient is on Treatment Plan : HEAD/NECK Cisplatin (40) q7d       CURRENT THERAPY: Concurrent chemoradiation  INTERVAL HISTORY: [name] 62 y.o. male returns for follow-up and evaluation prior to receiving weekly cisplatin therapy.  He continues to tolerate it well.  He denies any peripheral neuropathy.    He does have cancer related pain in his neck and continues on oxycodone with good tolerance.  His pain is under control and he is able to function well.  He denies increased somnolence however he has had some constipation.  His last bowel movement was 2 days ago however it was hard.  He is requesting something for his constipation however he is unable to go by the pharmacy [facility] today due to transportation difficulties.  He tells me that he is continuing to eat and drink without difficulty.  He monitors his weight and tell me today he understands his weight has decreased somewhat and he is going to work on eating more.  He sees nutrition with  each of his chemo appointments.  He also notes some skin breakdown on the right side of his neck from the radiation.  He denies any other significant issues today.  He noted the past few days his legs were swollen.  This is slowly  improving.  He denies any cough or shortness of breath..   Patient Active Problem List   Diagnosis Date Noted   Port-A-Cath in place 05/25/2022   Cancer of tonsillar fossa (HCC) 04/25/2022   SCC (squamous cell carcinoma) 04/24/2022   Normocytic anemia 04/24/2022   Chronic back pain 02/24/2020   Ileus (HCC) 03/24/2019   MDD (major depressive disorder), recurrent episode (HCC) 04/20/2018   Chronic right-sided low back pain 04/16/2018   Weakness of right lower extremity 04/16/2018   Healthcare maintenance 02/04/2018   Blood loss anemia 01/28/2018   Rectal bleeding 01/28/2018   Alcohol dependence with alcohol-induced mood disorder (HCC)    Major depressive disorder, recurrent severe without psychotic features (HCC) 08/01/2017   Dysphagia 07/08/2017   Stenosis of cervical spine with myelopathy (HCC) 07/06/2017   Left-sided weakness 06/27/2017   Tricompartment osteoarthritis of right knee 03/27/2017   Alcohol abuse with alcohol-induced mood disorder (HCC) 09/25/2016   Polysubstance dependence (HCC) 09/25/2016   Substance use disorder 09/19/2016   MDD (major depressive disorder), recurrent severe, without psychosis (HCC) 09/18/2016   Cocaine abuse (HCC) 07/27/2015   Suicidal ideation 07/27/2015   Cocaine dependence with intoxication with complication (HCC) 07/23/2015   Feeling like committing suicide 07/23/2015   Metabolic acidosis 07/23/2015   Tobacco abuse 07/23/2015   Substance induced mood disorder (HCC) 07/19/2015   Bipolar 1 disorder, depressed (HCC) 07/18/2015   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   Hemorrhoids    HIV (human immunodeficiency virus infection) (HCC)    Stenosis of cervical spine    withb myelopathy    SURGICAL HISTORY: Past Surgical History:  Procedure  Laterality Date   ANTERIOR CERVICAL DECOMP/DISCECTOMY FUSION N/A 07/06/2017   Procedure: ANTERIOR CERVICAL DECOMPRESSION/DISCECTOMY FUSION C3-4/C4-5 2 LEVELS;  Surgeon: Lisbeth Renshaw, MD;  Location: [facility] OR;  Service: Neurosurgery;  Laterality: N/A;   HERNIA REPAIR  97   lt ing   IR IMAGING GUIDED PORT INSERTION  05/19/2022   RECTAL EXAM UNDER ANESTHESIA N/A 04/24/2014   Procedure:  ligation of bleeding vessels;  Surgeon: Almond Lint, MD;  Location: [facility];  Service: General;  Laterality: N/A;   TENDON REPAIR     2006-lt index   WART FULGURATION  07/06/2011   Procedure: FULGURATION ANAL WART;  Surgeon: Shelly Rubenstein, MD;  Location: [facility];  Service: General;  Laterality: N/A;  excision anal condyloma   WART FULGURATION N/A 04/23/2014   Procedure: EXCISION OF ANAL CONDYLOMA;  Surgeon: Abigail Miyamoto, MD;  Location: [facility];  Service: General;  Laterality: N/A;    SOCIAL HISTORY: Social History   Socioeconomic History   Marital status: Single    Spouse name: Not on file   Number of children: Not on file   Years of education: Not on file   Highest education level: Not on file  Occupational History   Occupation: Unemployed  Tobacco Use   Smoking status: Every Day    Packs/day: 0.50    Years: 25.00    Additional pack years: 0.00    Total pack  years: 12.50    Types: Cigarettes   Smokeless tobacco: Never  Vaping Use   Vaping Use: Never used  Substance and Sexual Activity   Alcohol use: Yes    Alcohol/week: 24.0 standard drinks of alcohol    Types: 24 Cans of beer per week    Comment: ''I can drink a case a day''   Drug use: Yes    Types: "Crack" cocaine    Comment: pt reports last use was Wednesday   Sexual activity: Not Currently    Comment: pt declined condoms 09/22/19  Other Topics Concern   Not on file  Social History Narrative   Pt lives alone; receives Tree surgeon.   Social Determinants of Health   Financial Resource Strain: Not on file   Food Insecurity: Not on file  Transportation Needs: Not on file  Physical Activity: Not on file  Stress: Not on file  Social Connections: Not on file  Intimate Partner Violence: Not on file    FAMILY HISTORY: Family History  Problem Relation Age of Onset   Cancer Mother        brain   Cancer Sister        breast    Review of Systems  Constitutional:  Positive for fatigue. Negative for appetite change, chills, fever and unexpected weight change.  HENT:   Negative for hearing loss, lump/mass and trouble swallowing.   Eyes:  Negative for eye problems and icterus.  Respiratory:  Negative for chest tightness, cough and shortness of breath.   Cardiovascular:  Negative for chest pain, leg swelling and palpitations.  Gastrointestinal:  Positive for constipation. Negative for abdominal distention, abdominal pain, blood in stool, diarrhea, nausea, rectal pain and vomiting.  Endocrine: Negative for hot flashes.  Genitourinary:  Negative for difficulty urinating.   Musculoskeletal:  Negative for arthralgias.  Skin:  Negative for itching and rash.  Neurological:  Negative for dizziness, extremity weakness, headaches and numbness.  Hematological:  Negative for adenopathy. Does not bruise/bleed easily.  Psychiatric/Behavioral:  Negative for depression. The patient is not nervous/anxious.       PHYSICAL EXAMINATION   Onc Performance Status - 06/15/22 1315       ECOG Perf Status   ECOG Perf Status Ambulatory and capable of all selfcare but unable to carry out any work activities.  Up and about more than 50% of waking hours      KPS SCALE   KPS % SCORE Normal activity with effort, some s/s of disease             Vitals:   06/15/22 1253  BP: (!) 115/51  Pulse: 81  Resp: 18  Temp: 97.9 F (36.6 C)  SpO2: 100%    Physical Exam Constitutional:      General: He is not in acute distress.    Appearance: Normal appearance. He is not toxic-appearing.  HENT:     Head:  Normocephalic and atraumatic.     Mouth/Throat:     Mouth: Mucous membranes are moist.     Pharynx: Oropharynx is clear. No oropharyngeal exudate or posterior oropharyngeal erythema.  Eyes:     General: No scleral icterus. Cardiovascular:     Rate and Rhythm: Normal rate and regular rhythm.     Pulses: Normal pulses.     Heart sounds: Normal heart sounds.  Pulmonary:     Effort: Pulmonary effort is normal.     Breath sounds: Normal breath sounds.  Abdominal:     General: Abdomen is  flat. Bowel sounds are normal. There is no distension.     Palpations: Abdomen is soft.     Tenderness: There is no abdominal tenderness.  Musculoskeletal:        General: Swelling (Bilateral lower extremity edema noted.) present.     Cervical back: Neck supple.  Lymphadenopathy:     Cervical: No cervical adenopathy.  Skin:    General: Skin is warm and dry.     Findings: No rash.     Comments: Small area of dry desquamation noted in right lateral neck fold.  Neurological:     General: No focal deficit present.     Mental Status: He is alert.  Psychiatric:        Mood and Affect: Mood normal.        Behavior: Behavior normal.     LABORATORY DATA:  CBC    Component Value Date/Time   WBC 2.0 (L) 06/15/2022 1216   WBC 10.6 (H) 06/02/2022 0815   RBC 3.35 (L) 06/15/2022 1216   HGB 9.5 (L) 06/15/2022 1216   HCT 27.4 (L) 06/15/2022 1216   PLT 104 (L) 06/15/2022 1216   MCV 81.8 06/15/2022 1216   MCH 28.4 06/15/2022 1216   MCHC 34.7 06/15/2022 1216   RDW 17.4 (H) 06/15/2022 1216   LYMPHSABS 0.1 (L) 06/15/2022 1216   MONOABS 0.1 06/15/2022 1216   EOSABS 0.0 06/15/2022 1216   BASOSABS 0.0 06/15/2022 1216    CMP     Component Value Date/Time   NA 127 (L) 06/15/2022 1216   K 4.0 06/15/2022 1216   CL 93 (L) 06/15/2022 1216   CO2 29 06/15/2022 1216   GLUCOSE 98 06/15/2022 1216   BUN 21 06/15/2022 1216   CREATININE 1.00 06/15/2022 1216   CREATININE 1.07 03/01/2021 1649   CALCIUM 8.3 (L)  06/15/2022 1216   PROT 6.3 (L) 06/06/2022 1522   ALBUMIN 4.0 06/06/2022 1522   AST 42 (H) 06/06/2022 1522   ALT 51 (H) 06/06/2022 1522   ALKPHOS 44 06/06/2022 1522   BILITOT 0.7 06/06/2022 1522   GFRNONAA >60 06/15/2022 1216   GFRNONAA 86 09/22/2019 1121   GFRAA 99 09/22/2019 1121     ASSESSMENT and THERAPY PLAN:   Cancer of tonsillar fossa (HCC) [name] is a 62 year old man with 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  1.  Stage III squamous cell carcinoma: He will continue on concurrent tomorrow.  He has some dry desquamation noted to the right lateral neck fold this is early and mild. 2.  Dehydration: This is improved from prior.  He will continue to receive pre-hydration before and after treatment per his Cisplatin treatment regimen. 3.  Cancer related pain: He continues on Oxycodone PRN.  He reports pain management and denies increased somnolence.   4.  Weight changes: He will continue to follow-up with our registered dietitian during his treatments.  Oral intake continued to be encouraged. 5.  Opioid-induced constipation: I will give him Senokot S1 tablet p.o. twice daily and MiraLAX 1 capful to take daily as needed.  I reviewed this with him in detail.  Since he cannot go to the pharmacy today I prescribed these to go to [facility] outpatient pharmacy and they delivered him today to clinic. 6.  Leg swelling: This is likely from the fluids that he has received.  His BUN has returned to normal.  We do not need to give him supplemental IV fluids  on other days of the week others in his treatment day unless that level changes.  I commended that he elevate his legs when he is at home or when he is sitting down.  I reassured him that the swelling would improve over time.  [name] will return in 1 week for labs and follow-up along with his next treatment.  He knows to call for any questions  or concerns that may arise between now and his next visit.   All questions were answered. The patient knows to call the clinic with any problems, questions or concerns. We can certainly see the patient much sooner if necessary.  Total encounter time:45 minutes*in face-to-face visit time, chart review, lab review, care coordination, order entry, and documentation of the encounter time.    Lillard Anes, NP 06/16/22 10:08 AM Medical Oncology and Hematology [facility] [address] [place], [place] [address] Tel. [phone]    Fax. [phone]  *Total Encounter Time as defined by the Centers for Medicare and Medicaid Services includes, in addition to the face-to-face time of a patient visit (documented in the note above) non-face-to-face time: obtaining and reviewing outside history, ordering and reviewing medications, tests or procedures, care coordination (communications with other health care professionals or caregivers) and documentation in the medical record.

## 2022-06-16 ENCOUNTER — Inpatient Hospital Stay: Payer: Medicare Other | Admitting: Dietician

## 2022-06-16 ENCOUNTER — Inpatient Hospital Stay: Payer: Medicare Other

## 2022-06-16 ENCOUNTER — Encounter: Payer: Self-pay | Admitting: Adult Health

## 2022-06-16 ENCOUNTER — Other Ambulatory Visit: Payer: Self-pay

## 2022-06-16 ENCOUNTER — Other Ambulatory Visit: Payer: Self-pay | Admitting: *Deleted

## 2022-06-16 ENCOUNTER — Ambulatory Visit
Admission: RE | Admit: 2022-06-16 | Discharge: 2022-06-16 | Disposition: A | Discharge status: Home / Self Care | Payer: Medicare Other | Source: Ambulatory Visit | Attending: Radiation Oncology | Admitting: Radiation Oncology

## 2022-06-16 ENCOUNTER — Encounter: Payer: Self-pay | Admitting: Hematology and Oncology

## 2022-06-16 ENCOUNTER — Other Ambulatory Visit: Payer: Self-pay | Admitting: Adult Health

## 2022-06-16 ENCOUNTER — Inpatient Hospital Stay: Payer: Medicare Other | Admitting: Licensed Clinical Social Worker

## 2022-06-16 VITALS — BP 110/68 | HR 78 | Temp 98.5°F | Resp 16

## 2022-06-16 DIAGNOSIS — C090 Malignant neoplasm of tonsillar fossa: Secondary | ICD-10-CM

## 2022-06-16 DIAGNOSIS — R1310 Dysphagia, unspecified: Secondary | ICD-10-CM

## 2022-06-16 DIAGNOSIS — R339 Retention of urine, unspecified: Secondary | ICD-10-CM

## 2022-06-16 DIAGNOSIS — Z510 Encounter for antineoplastic radiation therapy: Secondary | ICD-10-CM | POA: Diagnosis not present

## 2022-06-16 DIAGNOSIS — C4492 Squamous cell carcinoma of skin, unspecified: Secondary | ICD-10-CM

## 2022-06-16 LAB — RAD ONC ARIA SESSION SUMMARY
Course Elapsed Days: 39
Plan Fractions Treated to Date: 3
Plan Prescribed Dose Per Fraction: 2 Gy
Plan Total Fractions Prescribed: 13
Plan Total Prescribed Dose: 26 Gy
Reference Point Dosage Given to Date: 6 Gy
Reference Point Session Dosage Given: 2 Gy
Session Number: 25

## 2022-06-16 LAB — BASIC METABOLIC PANEL - CANCER CENTER ONLY
Anion gap: 4 — ABNORMAL LOW (ref 5–15)
BUN: 15 mg/dL (ref 8–23)
CO2: 28 mmol/L (ref 22–32)
Calcium: 8.1 mg/dL — ABNORMAL LOW (ref 8.9–10.3)
Chloride: 98 mmol/L (ref 98–111)
Creatinine: 1.02 mg/dL (ref 0.61–1.24)
GFR, Estimated: >60 mL/min (ref >60)
Glucose, Bld: 104 mg/dL — ABNORMAL HIGH (ref 70–99)
Potassium: 4 mmol/L (ref 3.5–5.1)
Sodium: 130 mmol/L — ABNORMAL LOW (ref 135–145)

## 2022-06-16 LAB — URINALYSIS, COMPLETE (UACMP) WITH MICROSCOPIC
Bacteria, UA: NONE SEEN
Bilirubin Urine: NEGATIVE
Glucose, UA: NEGATIVE mg/dL
Hgb urine dipstick: NEGATIVE
Ketones, ur: NEGATIVE mg/dL
Leukocytes,Ua: NEGATIVE
Nitrite: NEGATIVE
Protein, ur: NEGATIVE mg/dL
Specific Gravity, Urine: 1.017 (ref 1.005–1.030)
pH: 6 (ref 5.0–8.0)

## 2022-06-16 LAB — RAPID URINE DRUG SCREEN, HOSP PERFORMED
Amphetamines: NOT DETECTED
Barbiturates: NOT DETECTED
Benzodiazepines: NOT DETECTED
Cocaine: NOT DETECTED
Opiates: POSITIVE — AB
Tetrahydrocannabinol: NOT DETECTED

## 2022-06-16 MED ORDER — SODIUM CHLORIDE 0.9 % IV SOLN
10.0000 mg | Freq: Once | INTRAVENOUS | Status: DC
  Filled 2022-06-16: qty 1

## 2022-06-16 MED ORDER — POTASSIUM CHLORIDE IN NACL 20-0.9 MEQ/L-% IV SOLN
Freq: Once | INTRAVENOUS | Status: AC
  Filled 2022-06-16: qty 1000

## 2022-06-16 MED ORDER — MAGNESIUM SULFATE 2 GM/50ML IV SOLN
2.0000 g | Freq: Once | INTRAVENOUS | Status: AC
  Administered 2022-06-16: 2 g via INTRAVENOUS
  Filled 2022-06-16: qty 50

## 2022-06-16 MED ORDER — ACETAMINOPHEN 325 MG PO TABS
650.0000 mg | ORAL_TABLET | Freq: Once | ORAL | Status: AC
  Administered 2022-06-16: 650 mg via ORAL
  Filled 2022-06-16: qty 2

## 2022-06-16 MED ORDER — SODIUM CHLORIDE 0.9 % IV SOLN: Freq: Once | INTRAVENOUS | Status: AC

## 2022-06-16 MED ORDER — SODIUM CHLORIDE 0.9% FLUSH
10.0000 mL | INTRAVENOUS | Status: DC | PRN
  Administered 2022-06-16: 10 mL

## 2022-06-16 MED ORDER — PALONOSETRON HCL INJECTION 0.25 MG/5ML
0.2500 mg | Freq: Once | INTRAVENOUS | Status: DC
  Filled 2022-06-16: qty 5

## 2022-06-16 MED ORDER — SODIUM CHLORIDE 0.9 % IV SOLN
40.0000 mg/m2 | Freq: Once | INTRAVENOUS | Status: DC
  Filled 2022-06-16: qty 77

## 2022-06-16 MED ORDER — HEPARIN SOD (PORK) LOCK FLUSH 100 UNIT/ML IV SOLN
500.0000 [IU] | Freq: Once | INTRAVENOUS | Status: AC | PRN
  Administered 2022-06-16: 500 [IU]

## 2022-06-16 NOTE — Progress Notes (Signed)
[facility] CSW Progress Note  Visual merchandiser met with patient to follow-up on housing. Pt has been staying with his sister in [place] and taking the train daily, but will now be staying at the [facility] for the end of treatment (sister assisting until 6/14 when he receives his disability check). Pt is also on the list for [employer] and is continuing to look for other stable housing.  No other needs at this time.    Aubrina Nieman E Athalie Newhard, LCSW Clinical Social Worker [facility]

## 2022-06-16 NOTE — Assessment & Plan Note (Signed)
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  1.  Stage III squamous cell carcinoma: He will continue on concurrent tomorrow.  He has some dry desquamation noted to the right lateral neck fold this is early and mild. 2.  Dehydration: This is improved from prior.  He will continue to receive pre-hydration before and after treatment per his Cisplatin treatment regimen. 3.  Cancer related pain: He continues on Oxycodone PRN.  He reports pain management and denies increased somnolence.   4.  Weight changes: He will continue to follow-up with our registered dietitian during his treatments.  Oral intake continued to be encouraged. 5.  Opioid-induced constipation: I will give him Senokot S1 tablet p.o. twice daily and MiraLAX 1 capful to take daily as needed.  I reviewed this with him in detail.  Since he cannot go to the pharmacy today I prescribed these to go to [facility] outpatient pharmacy and they delivered him today to clinic. 6.  Leg swelling: This is likely from the fluids that he has received.  His BUN has returned to normal.  We do not need to give him supplemental IV fluids on other days of the week others in his treatment day unless that level changes.  I commended that he elevate his legs when he is at home or when he is sitting down.  I reassured him that the swelling would improve over time.  [name] will return in 1 week for labs and follow-up along with his next treatment.  He knows to call for any questions or concerns that may arise between now and his next visit.

## 2022-06-16 NOTE — Patient Instructions (Addendum)

## 2022-06-16 NOTE — Progress Notes (Signed)
Nutrition Follow-up:  Patient with tonsil cancer. Patient has refused G-tube placement. He is receiving concurrent chemotherapy and radiation.   Met with patient in infusion. He is adding 12 packets of sugar to coffee this morning. Patient says he has had a rough few days. Patient reports episode of "massive diarrhea" after radiation yesterday. He did not have any further episodes. Patient reports intermittent nausea. Antiemetics are working well. He reports his taste has come back. His throat is mildly sore. Patient using lidocaine rinse prior to meals. He is swallowing "just fine." Yesterday he had grits, 2 Ensure, 3 pieces of toast with apple jelly, 3 muffins. Patient states he drank 5 bottles of water. Patient is planning to get a burger on his way home today. He has requested strawberry Ensure to drink. Patient also requested additional cup of coffee with 12 packets of sugar because his cup had gotten cold.    Medications: baclofen, glycolax, senna, oxycodone  Labs: Na 127  Anthropometrics: Wt 139 lb 4.8 oz on 6/6 decreased 4% in one week - this is severe  5/31 - 145 lb 4.8 oz  5/24 - 149 lb 2 oz 5/10 - 148 lb 13 oz 5/2 - 154 lb   Estimated Energy Needs  Kcals: 1610-9604 Protein: 100-117 Fluid: 2.3 L  NUTRITION DIAGNOSIS: Unintentional weight loss continues     INTERVENTION:  Reinforced importance of adequate calories and protein for weight maintenance Continue drinking Ensure Plus/equivalent, recommend 4-5/day. Provided Ensure Complete to drink during treatment Suggested pt consider decreasing amount of added sugar given diarrhea Reinforced importance of hydration   MONITORING, EVALUATION, GOAL: weight trends, intake   NEXT VISIT: Thursday June 13 during infusion

## 2022-06-16 NOTE — Progress Notes (Signed)
Reviewed patient's concern about wounds on neck and fingertips with Lillard Anes, NP. Small fingertip splits and sores, radiation issue with skin on his neck. Episode of diarrhea. Ok to treat today.  Ok to run post hydration fluids with cisplatin.  Patient has had so much trouble urinating today that it was decided that there will be no treatment today. Patient took 50-17ml PO fluids, about IVFs. Patient peed finally. UA, BMP sent for results. All per Lillard Anes, NP.  Refused VS prior to leaving due to wanting to go out and smoke.

## 2022-06-17 LAB — URINE CULTURE: Culture: <10000 — AB

## 2022-06-19 ENCOUNTER — Other Ambulatory Visit: Payer: Self-pay | Admitting: Adult Health

## 2022-06-19 ENCOUNTER — Other Ambulatory Visit: Payer: Self-pay

## 2022-06-19 ENCOUNTER — Telehealth: Payer: Self-pay | Admitting: Hematology and Oncology

## 2022-06-19 ENCOUNTER — Ambulatory Visit
Admission: RE | Admit: 2022-06-19 | Discharge: 2022-06-19 | Disposition: A | Discharge status: Home / Self Care | Payer: Medicare Other | Source: Ambulatory Visit | Attending: Radiation Oncology | Admitting: Radiation Oncology

## 2022-06-19 DIAGNOSIS — Z510 Encounter for antineoplastic radiation therapy: Secondary | ICD-10-CM | POA: Diagnosis not present

## 2022-06-19 LAB — RAD ONC ARIA SESSION SUMMARY
Course Elapsed Days: 42
Plan Fractions Treated to Date: 4
Plan Prescribed Dose Per Fraction: 2 Gy
Plan Total Fractions Prescribed: 13
Plan Total Prescribed Dose: 26 Gy
Reference Point Dosage Given to Date: 8 Gy
Reference Point Session Dosage Given: 2 Gy
Session Number: 26

## 2022-06-19 NOTE — Telephone Encounter (Signed)
Scheduled appointments per staff message. Left voicemail. 

## 2022-06-20 ENCOUNTER — Inpatient Hospital Stay: Payer: Medicare Other

## 2022-06-20 ENCOUNTER — Ambulatory Visit
Admission: RE | Admit: 2022-06-20 | Discharge: 2022-06-20 | Disposition: A | Discharge status: Home / Self Care | Payer: Medicare Other | Source: Ambulatory Visit | Attending: Radiation Oncology | Admitting: Radiation Oncology

## 2022-06-20 ENCOUNTER — Inpatient Hospital Stay (HOSPITAL_BASED_OUTPATIENT_CLINIC_OR_DEPARTMENT_OTHER): Payer: Medicare Other | Admitting: Hematology and Oncology

## 2022-06-20 ENCOUNTER — Other Ambulatory Visit: Payer: Self-pay

## 2022-06-20 VITALS — BP 117/65 | HR 113 | Temp 97.7°F | Resp 18 | Ht 72.0 in | Wt 138.0 lb

## 2022-06-20 DIAGNOSIS — M7989 Other specified soft tissue disorders: Secondary | ICD-10-CM | POA: Diagnosis not present

## 2022-06-20 DIAGNOSIS — Z510 Encounter for antineoplastic radiation therapy: Secondary | ICD-10-CM | POA: Diagnosis not present

## 2022-06-20 DIAGNOSIS — C090 Malignant neoplasm of tonsillar fossa: Secondary | ICD-10-CM

## 2022-06-20 DIAGNOSIS — Z95828 Presence of other vascular implants and grafts: Secondary | ICD-10-CM

## 2022-06-20 LAB — CBC WITH DIFFERENTIAL (CANCER CENTER ONLY)
Abs Immature Granulocytes: 0.01 10*3/uL (ref 0.00–0.07)
Basophils Absolute: 0 10*3/uL (ref 0.0–0.1)
Basophils Relative: 0 %
Eosinophils Absolute: 0 10*3/uL (ref 0.0–0.5)
Eosinophils Relative: 0 %
HCT: 26.1 % — ABNORMAL LOW (ref 39.0–52.0)
Hemoglobin: 8.5 g/dL — ABNORMAL LOW (ref 13.0–17.0)
Immature Granulocytes: 2 %
Lymphocytes Relative: 20 %
Lymphs Abs: 0.1 10*3/uL — ABNORMAL LOW (ref 0.7–4.0)
MCH: 27.3 pg (ref 26.0–34.0)
MCHC: 32.6 g/dL (ref 30.0–36.0)
MCV: 83.9 fL (ref 80.0–100.0)
Monocytes Absolute: 0.1 10*3/uL (ref 0.1–1.0)
Monocytes Relative: 23 %
Neutro Abs: 0.3 10*3/uL — CL (ref 1.7–7.7)
Neutrophils Relative %: 55 %
Platelet Count: 105 10*3/uL — ABNORMAL LOW (ref 150–400)
RBC: 3.11 MIL/uL — ABNORMAL LOW (ref 4.22–5.81)
RDW: 18.1 % — ABNORMAL HIGH (ref 11.5–15.5)
Smear Review: NORMAL
WBC Count: 0.6 10*3/uL — CL (ref 4.0–10.5)
nRBC: 0 % (ref 0.0–0.2)

## 2022-06-20 LAB — RAD ONC ARIA SESSION SUMMARY
Course Elapsed Days: 43
Plan Fractions Treated to Date: 5
Plan Prescribed Dose Per Fraction: 2 Gy
Plan Total Fractions Prescribed: 13
Plan Total Prescribed Dose: 26 Gy
Reference Point Dosage Given to Date: 10 Gy
Reference Point Session Dosage Given: 2 Gy
Session Number: 27

## 2022-06-20 LAB — BASIC METABOLIC PANEL - CANCER CENTER ONLY
Anion gap: 5 (ref 5–15)
BUN: 14 mg/dL (ref 8–23)
CO2: 29 mmol/L (ref 22–32)
Calcium: 8.4 mg/dL — ABNORMAL LOW (ref 8.9–10.3)
Chloride: 99 mmol/L (ref 98–111)
Creatinine: 0.95 mg/dL (ref 0.61–1.24)
GFR, Estimated: >60 mL/min (ref >60)
Glucose, Bld: 130 mg/dL — ABNORMAL HIGH (ref 70–99)
Potassium: 4 mmol/L (ref 3.5–5.1)
Sodium: 133 mmol/L — ABNORMAL LOW (ref 135–145)

## 2022-06-20 LAB — MAGNESIUM: Magnesium: 1.9 mg/dL (ref 1.7–2.4)

## 2022-06-20 MED ORDER — SODIUM CHLORIDE 0.9% FLUSH
10.0000 mL | Freq: Once | INTRAVENOUS | Status: AC
  Administered 2022-06-20: 10 mL

## 2022-06-20 MED ORDER — HEPARIN SOD (PORK) LOCK FLUSH 100 UNIT/ML IV SOLN
500.0000 [IU] | Freq: Once | INTRAVENOUS | Status: AC
  Administered 2022-06-20: 500 [IU]

## 2022-06-20 MED FILL — Dexamethasone Sodium Phosphate Inj 100 MG/10ML: INTRAMUSCULAR | Qty: 1 | Status: AC

## 2022-06-20 NOTE — Assessment & Plan Note (Signed)
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  He was admitted to the hospital and was seen by radiation oncology and one of my colleagues.  He had a PET/CT which did not show any definitive evidence of metastatic disease.  There was some comment of polypoid soft tissue density mass in the left mainstem bronchus noted on a CT chest from an outside hospital however this was not necessarily called as hypermetabolic in the PET/CT. After coordination with ID team, we agreed to proceed with weekly cisplatin. He completed 4 cycles so far, tolerating extremely well. His pain is well controlled, requested prescription for lidocaine today. CBC today shows severe leukopenia and hence we recommended to defer chemo tomorrow to next week. He understands Encouraged his caloric intake, he is happy he gained weight. No concern for infections today RTC in 1 week.

## 2022-06-20 NOTE — Progress Notes (Signed)
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 He was subsequently referred to ENT in [place] and had a soft tissue neck CT performed on 03/03/22 which demonstrated a large left neck mass and a tonsillar mass. Involvement of the left soft palate and possible invasion of the left tongue base were also appreciated. The scanned report is partially unreadable due to low quality, however findings detailed a a 6.1 cm mass-like area of matted left level 2 and level 3 lymph nodes with areas of necrosis, and a 6.2 cm fatty neoplasm posterior to the right sternocleidomastoid muscle.    03/15/2022 Initial Biopsy   Biopsy of the left neck mass: metastatic squamous cell carcinoma; p16 positive.    04/28/2022 PET scan   IMPRESSION: 1. 3.6 by 2.5 by 4.8 cm left tonsillar mass extending into the epiglottis, maximum SUV 13.9, compatible with malignancy. 2. Bulky conglomerate left level IIA adenopathy is hypermetabolic and compatible with malignancy. 3. 5.0 by 2.7 cm fatty mass along the superficial fascia margin of the right posterior neck, with internal stranding and mild internal nodularity. Low-grade associated activity, maximum SUV 1.4. Possibilities include low-grade liposarcoma and atypical lipoma. 4. Hypermetabolic activity along an exophytic density in the vicinity of the distal anal canal and perianal region, maximum SUV 8.6. Exophytic anal mass such as squamous cell carcinoma  not excluded. Visual inspection and digital rectal exam recommended for further assessment. 5. Severe right degenerative glenohumeral arthropathy with free osteochondral fragment in the subscapular recess of the joint and a smaller free fragment in the biceps recess of the joint, with suspected right glenohumeral joint effusion. There is also severe asymmetric degenerative right hip arthropathy. Mesoacromial os acromiale bilaterally, with associated degenerative findings on the right side. 6. Aortic atherosclerosis. 7. Emphysema.   Aortic Atherosclerosis (ICD10-I70.0) and Emphysema (ICD10-J43.9).     Electronically Signed   By: Gaylyn Rong M.D.   On: 04/28/2022 10:26   05/25/2022 -  Chemotherapy   Patient is on Treatment Plan : HEAD/NECK Cisplatin (40) q7d       CURRENT THERAPY: Concurrent chemoradiation  INTERVAL HISTORY:  [name] 62 y.o. male returns for follow-up and evaluation prior to receiving weekly cisplatin therapy.  He received 4 weekly cycles so far. He has tolerated it extremely well. No nausea, vomiting, diarrhea, change in hearing or neuropathy reported.He is quite thrilled by the response in the mass on left side. He says he has been drinking water, boost, gained about 3 lbs. Pain appears to be well controlled he says. No other concerns today Rest of the pertinent 10 point ROS reviewed and neg.   Patient Active Problem List   Diagnosis Date Noted   Port-A-Cath in place 05/25/2022   Cancer of tonsillar fossa (HCC) 04/25/2022   SCC (squamous cell carcinoma) 04/24/2022   Normocytic anemia 04/24/2022   Chronic back pain 02/24/2020   Ileus (HCC) 03/24/2019   MDD (major depressive disorder), recurrent episode (HCC) 04/20/2018   Chronic right-sided low back pain  04/16/2018   Weakness of right lower extremity 04/16/2018   Healthcare maintenance 02/04/2018   Blood loss anemia 01/28/2018   Rectal bleeding 01/28/2018   Alcohol dependence with  alcohol-induced mood disorder (HCC)    Major depressive disorder, recurrent severe without psychotic features (HCC) 08/01/2017   Dysphagia 07/08/2017   Stenosis of cervical spine with myelopathy (HCC) 07/06/2017   Left-sided weakness 06/27/2017   Tricompartment osteoarthritis of right knee 03/27/2017   Alcohol abuse with alcohol-induced mood disorder (HCC) 09/25/2016   Polysubstance dependence (HCC) 09/25/2016   Substance use disorder 09/19/2016   MDD (major depressive disorder), recurrent severe, without psychosis (HCC) 09/18/2016   Cocaine abuse (HCC) 07/27/2015   Suicidal ideation 07/27/2015   Cocaine dependence with intoxication with complication (HCC) 07/23/2015   Feeling like committing suicide 07/23/2015   Metabolic acidosis 07/23/2015   Tobacco abuse 07/23/2015   Substance induced mood disorder (HCC) 07/19/2015   Bipolar 1 disorder, depressed (HCC) 07/18/2015   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   Hemorrhoids    HIV (human immunodeficiency virus infection) (HCC)    Stenosis of cervical spine    withb myelopathy    SURGICAL HISTORY: Past Surgical History:  Procedure Laterality Date   ANTERIOR CERVICAL DECOMP/DISCECTOMY FUSION N/A 07/06/2017   Procedure: ANTERIOR CERVICAL DECOMPRESSION/DISCECTOMY FUSION C3-4/C4-5 2 LEVELS;  Surgeon: Lisbeth Renshaw, MD;  Location: [facility] OR;  Service: Neurosurgery;  Laterality: N/A;   HERNIA REPAIR  97   lt ing   IR IMAGING GUIDED PORT INSERTION  05/19/2022   RECTAL EXAM UNDER ANESTHESIA N/A 04/24/2014   Procedure:  ligation of bleeding vessels;  Surgeon: Almond Lint, MD;  Location: [facility];  Service: General;  Laterality: N/A;   TENDON REPAIR     2006-lt index   WART FULGURATION  07/06/2011   Procedure: FULGURATION ANAL WART;  Surgeon: Shelly Rubenstein, MD;  Location: [facility];  Service: General;  Laterality: N/A;  excision anal condyloma   WART FULGURATION N/A 04/23/2014   Procedure: EXCISION OF ANAL CONDYLOMA;  Surgeon: Abigail Miyamoto, MD;  Location: [facility];  Service: General;  Laterality: N/A;    SOCIAL HISTORY: Social History   Socioeconomic History   Marital status: Single    Spouse name: Not on file   Number of children: Not on file   Years of education: Not on file   Highest education level: Not on file  Occupational History   Occupation: Unemployed  Tobacco Use   Smoking status: Every Day    Packs/day: 0.50    Years: 25.00    Additional pack years: 0.00    Total pack years: 12.50    Types: Cigarettes   Smokeless tobacco: Never  Vaping Use   Vaping Use: Never used  Substance and Sexual Activity   Alcohol use: Yes    Alcohol/week: 24.0 standard drinks of alcohol    Types: 24 Cans of beer per week    Comment: ''I can drink a case a day''   Drug use: Yes    Types: "Crack" cocaine    Comment: pt reports last use was Wednesday   Sexual activity: Not Currently    Comment: pt declined condoms 09/22/19  Other Topics Concern   Not on file  Social History Narrative   Pt lives alone; receives Tree surgeon.   Social  Determinants of Health   Financial Resource Strain: Not on file  Food Insecurity: Not on file  Transportation Needs: Not on file  Physical Activity: Not on file  Stress: Not on file  Social Connections: Not on file  Intimate Partner Violence: Not on file    FAMILY HISTORY: Family History  Problem Relation Age of Onset   Cancer Mother        brain   Cancer Sister        breast    Review of Systems  Constitutional:  Positive for fatigue. Negative for appetite change, chills, fever and unexpected weight change.  HENT:   Negative for hearing loss, lump/mass and trouble swallowing.   Eyes:  Negative for eye problems and icterus.  Respiratory:  Negative for chest  tightness, cough and shortness of breath.   Cardiovascular:  Negative for chest pain, leg swelling and palpitations.  Gastrointestinal:  Positive for constipation. Negative for abdominal distention, abdominal pain, blood in stool, diarrhea, nausea, rectal pain and vomiting.  Endocrine: Negative for hot flashes.  Genitourinary:  Negative for difficulty urinating.   Musculoskeletal:  Negative for arthralgias.  Skin:  Positive for rash. Negative for itching.  Neurological:  Negative for dizziness, extremity weakness, headaches and numbness.  Hematological:  Negative for adenopathy. Does not bruise/bleed easily.  Psychiatric/Behavioral:  Negative for depression. The patient is not nervous/anxious.       PHYSICAL EXAMINATION     Vitals:   06/20/22 1406  BP: 117/65  Pulse: (!) 113  Resp: 18  Temp: 97.7 F (36.5 C)  SpO2: 97%    Physical Exam Constitutional:      General: He is not in acute distress.    Appearance: Normal appearance. He is not toxic-appearing.  HENT:     Head: Normocephalic and atraumatic.     Mouth/Throat:     Mouth: Mucous membranes are moist.     Pharynx: Oropharynx is clear. No oropharyngeal exudate or posterior oropharyngeal erythema.  Eyes:     General: No scleral icterus. Cardiovascular:     Rate and Rhythm: Normal rate and regular rhythm.     Pulses: Normal pulses.     Heart sounds: Normal heart sounds.  Pulmonary:     Effort: Pulmonary effort is normal.     Breath sounds: Normal breath sounds.  Abdominal:     General: Abdomen is flat. Bowel sounds are normal. There is no distension.     Palpations: Abdomen is soft.     Tenderness: There is no abdominal tenderness.  Musculoskeletal:        General: Swelling (Bilateral lower extremity edema noted. right greater than left) present.     Cervical back: Neck supple.  Lymphadenopathy:     Cervical: No cervical adenopathy.  Skin:    General: Skin is warm and dry.     Findings: No rash.      Comments: Small area of dry desquamation noted in right lateral neck fold.  Neurological:     General: No focal deficit present.     Mental Status: He is alert.  Psychiatric:        Mood and Affect: Mood normal.        Behavior: Behavior normal.     LABORATORY DATA:  CBC    Component Value Date/Time   WBC 0.6 (LL) 06/20/2022 1241   WBC 10.6 (H) 06/02/2022 0815   RBC 3.11 (L) 06/20/2022 1241   HGB 8.5 (L) 06/20/2022 1241   HCT 26.1 (L) 06/20/2022 1241  PLT 105 (L) 06/20/2022 1241   MCV 83.9 06/20/2022 1241   MCH 27.3 06/20/2022 1241   MCHC 32.6 06/20/2022 1241   RDW 18.1 (H) 06/20/2022 1241   LYMPHSABS 0.1 (L) 06/20/2022 1241   MONOABS 0.1 06/20/2022 1241   EOSABS 0.0 06/20/2022 1241   BASOSABS 0.0 06/20/2022 1241    CMP     Component Value Date/Time   NA 133 (L) 06/20/2022 1241   K 4.0 06/20/2022 1241   CL 99 06/20/2022 1241   CO2 29 06/20/2022 1241   GLUCOSE 130 (H) 06/20/2022 1241   BUN 14 06/20/2022 1241   CREATININE 0.95 06/20/2022 1241   CREATININE 1.07 03/01/2021 1649   CALCIUM 8.4 (L) 06/20/2022 1241   PROT 6.3 (L) 06/06/2022 1522   ALBUMIN 4.0 06/06/2022 1522   AST 42 (H) 06/06/2022 1522   ALT 51 (H) 06/06/2022 1522   ALKPHOS 44 06/06/2022 1522   BILITOT 0.7 06/06/2022 1522   GFRNONAA >60 06/20/2022 1241   GFRNONAA 86 09/22/2019 1121   GFRAA 99 09/22/2019 1121     ASSESSMENT and THERAPY PLAN:   Cancer of tonsillar fossa (HCC) This is a 62 year old male patient with past medical history significant for HIV, anal condyloma, cervical stenosis with myelopathy who is now 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  He was admitted to the hospital and was seen by radiation oncology and one of my colleagues.  He had a PET/CT which  did not show any definitive evidence of metastatic disease.  There was some comment of polypoid soft tissue density mass in the left mainstem bronchus noted on a CT chest from an outside hospital however this was not necessarily called as hypermetabolic in the PET/CT. After coordination with ID team, we agreed to proceed with weekly cisplatin. He completed 4 cycles so far, tolerating extremely well. His pain is well controlled, requested prescription for lidocaine today. CBC today shows severe leukopenia and hence we recommended to defer chemo tomorrow to next week. He understands Encouraged his caloric intake, he is happy he gained weight. No concern for infections today RTC in 1 week.   All questions were answered. The patient knows to call the clinic with any problems, questions or concerns. We can certainly see the patient much sooner if necessary.  Total encounter time:30 minutes*in face-to-face visit time, chart review, lab review, care coordination, order entry, and documentation of the encounter time.  *Total Encounter Time as defined by the Centers for Medicare and Medicaid Services includes, in addition to the face-to-face time of a patient visit (documented in the note above) non-face-to-face time: obtaining and reviewing outside history, ordering and reviewing medications, tests or procedures, care coordination (communications with other health care professionals or caregivers) and documentation in the medical record.

## 2022-06-21 ENCOUNTER — Other Ambulatory Visit: Payer: Self-pay

## 2022-06-21 ENCOUNTER — Emergency Department (HOSPITAL_COMMUNITY): Payer: Medicare Other

## 2022-06-21 ENCOUNTER — Telehealth: Payer: Self-pay

## 2022-06-21 ENCOUNTER — Inpatient Hospital Stay: Payer: Medicare Other

## 2022-06-21 ENCOUNTER — Emergency Department (HOSPITAL_BASED_OUTPATIENT_CLINIC_OR_DEPARTMENT_OTHER): Payer: Medicare Other

## 2022-06-21 ENCOUNTER — Emergency Department (HOSPITAL_COMMUNITY)
Admission: EM | Admit: 2022-06-21 | Discharge: 2022-06-21 | Discharge status: Against Medical Advice | Payer: Medicare Other | Source: Home / Self Care | Attending: Emergency Medicine | Admitting: Emergency Medicine

## 2022-06-21 ENCOUNTER — Ambulatory Visit: Payer: Medicare Other

## 2022-06-21 DIAGNOSIS — R55 Syncope and collapse: Secondary | ICD-10-CM | POA: Insufficient documentation

## 2022-06-21 DIAGNOSIS — R519 Headache, unspecified: Secondary | ICD-10-CM | POA: Insufficient documentation

## 2022-06-21 DIAGNOSIS — Z8521 Personal history of malignant neoplasm of larynx: Secondary | ICD-10-CM | POA: Insufficient documentation

## 2022-06-21 DIAGNOSIS — Z5329 Procedure and treatment not carried out because of patient's decision for other reasons: Secondary | ICD-10-CM | POA: Insufficient documentation

## 2022-06-21 DIAGNOSIS — Z21 Asymptomatic human immunodeficiency virus [HIV] infection status: Secondary | ICD-10-CM | POA: Insufficient documentation

## 2022-06-21 DIAGNOSIS — J189 Pneumonia, unspecified organism: Secondary | ICD-10-CM | POA: Diagnosis not present

## 2022-06-21 DIAGNOSIS — D170 Benign lipomatous neoplasm of skin and subcutaneous tissue of head, face and neck: Secondary | ICD-10-CM | POA: Insufficient documentation

## 2022-06-21 DIAGNOSIS — Z7982 Long term (current) use of aspirin: Secondary | ICD-10-CM | POA: Insufficient documentation

## 2022-06-21 DIAGNOSIS — R0602 Shortness of breath: Secondary | ICD-10-CM | POA: Diagnosis not present

## 2022-06-21 DIAGNOSIS — M7989 Other specified soft tissue disorders: Secondary | ICD-10-CM

## 2022-06-21 LAB — BASIC METABOLIC PANEL
Anion gap: 8 (ref 5–15)
BUN: 13 mg/dL (ref 8–23)
CO2: 26 mmol/L (ref 22–32)
Calcium: 8.1 mg/dL — ABNORMAL LOW (ref 8.9–10.3)
Chloride: 98 mmol/L (ref 98–111)
Creatinine, Ser: 1 mg/dL (ref 0.61–1.24)
GFR, Estimated: >60 mL/min (ref >60)
Glucose, Bld: 91 mg/dL (ref 70–99)
Potassium: 3.6 mmol/L (ref 3.5–5.1)
Sodium: 132 mmol/L — ABNORMAL LOW (ref 135–145)

## 2022-06-21 LAB — CBC
HCT: 25.3 % — ABNORMAL LOW (ref 39.0–52.0)
Hemoglobin: 8 g/dL — ABNORMAL LOW (ref 13.0–17.0)
MCH: 27.1 pg (ref 26.0–34.0)
MCHC: 31.6 g/dL (ref 30.0–36.0)
MCV: 85.8 fL (ref 80.0–100.0)
Platelets: 122 10*3/uL — ABNORMAL LOW (ref 150–400)
RBC: 2.95 MIL/uL — ABNORMAL LOW (ref 4.22–5.81)
RDW: 18.3 % — ABNORMAL HIGH (ref 11.5–15.5)
WBC: 0.6 10*3/uL — CL (ref 4.0–10.5)
nRBC: 0 % (ref 0.0–0.2)

## 2022-06-21 LAB — CBG MONITORING, ED: Glucose-Capillary: 73 mg/dL (ref 70–99)

## 2022-06-21 MED ORDER — HEPARIN SOD (PORK) LOCK FLUSH 100 UNIT/ML IV SOLN
500.0000 [IU] | Freq: Once | INTRAVENOUS | Status: AC
  Administered 2022-06-21: 500 [IU]
  Filled 2022-06-21: qty 5

## 2022-06-21 NOTE — ED Triage Notes (Signed)
Pt arrives from cafeteria via wheelchair. Witness syncopal event, fell backwards and hit head. Denies head pain, other pain. Endorses dizziness, has also been ongoing. Hx head and neck cancer, been getting radiation and was on his way there today. After fall, was noted to be 'off' and having some difficulty with movement. A&Ox4 in triage.

## 2022-06-21 NOTE — ED Notes (Signed)
Patient found standing on the side of the patient with belongings and clothes on. He states he has to leave now due to an emergency.

## 2022-06-21 NOTE — Progress Notes (Signed)
RLE venous duplex has been completed.  Preliminary results given to [name], PA-C.   Results can be found under chart review under CV PROC. 06/21/2022 12:22 PM Minie Roadcap RVT, RDMS

## 2022-06-21 NOTE — ED Provider Notes (Signed)
[place] EMERGENCY DEPARTMENT AT [facility] Provider Note   CSN: [id number] Arrival date & time: 06/21/22  4098     History  Chief Complaint  Patient presents with   Loss of Consciousness   Fall    Gennady Mossbarger is a 62 y.o. male.  The history is provided by the patient and medical records. No language interpreter was used.  Loss of Consciousness Fall     62 year old male with extensive past medical history which includes HIV, depression, substance abuse, throat cancer brought here from the hospital cafeteria in a wheelchair for evaluation of a witnessed syncopal episode.  Patient report he was at the cafeteria today when he felt lightheadedness and subsequently had a syncopal episode.  He did hit the back of his head and endorsed some mild throbbing pain to the back of his head.  He mentioned that he has been experiencing episodes of lightheadedness and dizziness for the past 2 to 3 days.  Symptoms brought on by change in position.  Also mentioned that he has been losing weight rapidly and is not having much of an appetite because he cannot taste his food well.  He is trying to eat.  He also noticed swelling of his right lower extremity for the past several days as well as having shortness of breath.  He is scheduled to have an ultrasound of her leg today and thus why he came to the hospital but states that the ultrasound appointment was canceled.  He is unsure why.  He does not endorse any exertional chest pain prior to his syncope.  Denies any neck pain or pain elsewhere.  No urinary symptoms.  Home Medications Prior to Admission medications   Medication Sig Start Date End Date Taking? Authorizing Provider  aprepitant (EMEND) 80 & 125 MG capsule Take by mouth. 04/11/22   [provider]  aspirin EC 81 MG tablet Take 81 mg by mouth daily.    [provider]  baclofen (LIORESAL) 20 MG tablet Take 20 mg by mouth 3 (three) times daily as needed for muscle  spasms. 06/16/19   [provider]  buPROPion (WELLBUTRIN XL) 300 MG 24 hr tablet Take 1 tablet (300 mg total) by mouth daily. 06/06/22   Lonie Peak, MD  cloNIDine (CATAPRES) 0.1 MG tablet Take 0.1 mg by mouth 2 (two) times daily.    [provider]  Darunavir-Cobicistat-Emtricitabine-Tenofovir Alafenamide (SYMTUZA) 800-150-200-10 MG TABS Take 1 tablet by mouth daily with breakfast. 05/11/22   [name], MD  dexamethasone (DECADRON) 4 MG tablet Take 2 tablets (8 mg) by mouth daily x 3 days starting the day after cisplatin chemotherapy. Take with food. 06/09/22   Loa Socks, NP  diphenhydrAMINE (BENADRYL) 25 MG tablet Take 25 mg by mouth every 6 (six) hours as needed for sleep.    [provider]  fluconazole (DIFLUCAN) 100 MG tablet Take 2 tablets today, then 1 tablet daily x 20 more days. Hold Rosuvastatin while on this. 06/09/22   Loa Socks, NP  gabapentin (NEURONTIN) 800 MG tablet Take 1 tablet (800 mg total) by mouth 3 (three) times daily. 05/23/22   Lonie Peak, MD  granisetron (KYTRIL) 1 MG tablet Take by mouth. 04/10/22   [provider]  lidocaine (XYLOCAINE) 2 % solution Patient: Mix 1part 2% viscous lidocaine, 1part H20. Swish & swallow 10mL of diluted mixture, before meals and at bedtime, up to QID 06/09/22   Causey, Larna Daughters, NP  lidocaine-prilocaine (EMLA) cream  Apply to affected area once 05/25/22   Rachel Moulds, MD  LORazepam (ATIVAN) 0.5 MG tablet Take one tablet - dissolve under tongue - PRN claustrophobia, 20 min prior to radiation. Patient not taking: Reported on 06/15/2022 04/28/22   Lonie Peak, MD  losartan (COZAAR) 25 MG tablet Take 1 tablet (25 mg total) by mouth daily. 06/06/22   Lonie Peak, MD  ondansetron (ZOFRAN) 8 MG tablet Take 1 tablet (8 mg total) by mouth every 8 (eight) hours as needed for nausea or vomiting. Start on the third day after cisplatin. 05/25/22   Rachel Moulds, MD   oxyCODONE (OXY IR/ROXICODONE) 5 MG immediate release tablet Take 1-2 tablets (5-10 mg total) by mouth every 4 (four) hours as needed for severe pain. 06/13/22   Loa Socks, NP  polyethylene glycol powder (GLYCOLAX) 17 GM/SCOOP powder Take 1/2 to 1 capsule dissolved in liquid daily for bowel regulation while on pain medication 06/15/22   Loa Socks, NP  prochlorperazine (COMPAZINE) 10 MG tablet Take 1 tablet (10 mg total) by mouth every 6 (six) hours as needed (Nausea or vomiting). 05/25/22   Rachel Moulds, MD  rosuvastatin (CRESTOR) 5 MG tablet Take 5 mg by mouth daily. 02/13/22   [provider]  senna-docusate (SENOKOT-S) 8.6-50 MG tablet Take 1 tablet by mouth 2 (two) times daily. 06/15/22   Loa Socks, NP  valACYclovir (VALTREX) 500 MG tablet Take 1 tablet (500 mg total) by mouth 2 (two) times daily. 06/09/22   Loa Socks, NP      Allergies    Shellfish allergy, Sulfa antibiotics, Fish allergy, and Clindamycin    Review of Systems   Review of Systems  Cardiovascular:  Positive for syncope.  All other systems reviewed and are negative.   Physical Exam Updated Vital Signs BP 111/86 (BP Location: Right Arm)   Pulse 96   Temp 98.6 F (37 C) (Oral)   Resp 18   SpO2 99%  Physical Exam Vitals and nursing note reviewed.  Constitutional:      General: He is not in acute distress.    Appearance: He is well-developed.     Comments: Voice is hoarse  HENT:     Head: Atraumatic.     Comments: Mild tenderness to occipital scalp without signs of trauma. Eyes:     Conjunctiva/sclera: Conjunctivae normal.  Neck:     Comments: Lipoma noted to right side of neck Cardiovascular:     Rate and Rhythm: Normal rate and regular rhythm.     Pulses: Normal pulses.     Heart sounds: Normal heart sounds.  Pulmonary:     Effort: Pulmonary effort is normal.     Breath sounds: Normal breath sounds.  Abdominal:     Palpations: Abdomen is  soft.     Tenderness: There is no abdominal tenderness.  Musculoskeletal:        General: Swelling (2+ pitting noted to lower extremity compared to left.) present.     Cervical back: Neck supple.     Comments: 3/5 strength right lower extremity, 4/5 strength to left lower extremity  Skin:    Findings: No rash.  Neurological:     Mental Status: He is alert and oriented to person, place, and time.  Psychiatric:        Mood and Affect: Mood normal.     ED Results / Procedures / Treatments   Labs (all labs ordered are listed, but only abnormal results are displayed) Labs Reviewed  BASIC  METABOLIC PANEL - Abnormal; Notable for the following components:      Result Value   Sodium 132 (*)    Calcium 8.1 (*)    All other components within normal limits  CBC - Abnormal; Notable for the following components:   WBC 0.6 (*)    RBC 2.95 (*)    Hemoglobin 8.0 (*)    HCT 25.3 (*)    RDW 18.3 (*)    Platelets 122 (*)    All other components within normal limits  URINALYSIS, ROUTINE W REFLEX MICROSCOPIC  CBG MONITORING, ED    EKG None ED ECG REPORT   Date: 06/21/2022  Rate: 91  Rhythm: normal sinus rhythm  QRS Axis: normal  Intervals: normal  ST/T Wave abnormalities: normal  Conduction Disutrbances:none  Narrative Interpretation: LVH  Old EKG Reviewed: unchanged  I have personally reviewed the EKG tracing and agree with the computerized printout as noted.   Radiology CT HEAD WO CONTRAST  Result Date: 06/21/2022 CLINICAL DATA:  62 year old male status post syncope and fall backwards. EXAM: CT HEAD WITHOUT CONTRAST TECHNIQUE: Contiguous axial images were obtained from the base of the skull through the vertex without intravenous contrast. RADIATION DOSE REDUCTION: This exam was performed according to the departmental dose-optimization program which includes automated exposure control, adjustment of the mA and/or kV according to patient size and/or use of iterative reconstruction  technique. COMPARISON:  Brain MRI 06/27/2017.  Head CT 05/09/2020. FINDINGS: Brain: Cerebral volume is not significantly changed since 2022. Asymmetry of the lateral ventricles appears stable from the 2019 MRI and appears to be normal variation. No midline shift, ventriculomegaly, mass effect, evidence of mass lesion, intracranial hemorrhage or evidence of cortically based acute infarction. Gray-white differentiation is stable and within normal limits for age. Vascular: No suspicious intracranial vascular hyperdensity. Mild Calcified atherosclerosis at the skull base. Skull: No acute osseous abnormality identified. Sinuses/Orbits: Visualized paranasal sinuses and mastoids are stable and well aerated. Other: No orbit or scalp soft tissue injury identified. IMPRESSION: 1. No acute traumatic injury identified. 2. Stable and normal for age non contrast CT appearance of the brain. Electronically Signed   By: Odessa Fleming M.D.   On: 06/21/2022 11:28    Procedures Procedures    Medications Ordered in ED Medications  heparin lock flush 100 unit/mL (has no administration in time range)    ED Course/ Medical Decision Making/ A&P                             Medical Decision Making Amount and/or Complexity of Data Reviewed Labs: ordered. Radiology: ordered.  Risk Prescription drug management.   BP 111/86 (BP Location: Right Arm)   Pulse 96   Temp 98.6 F (37 C) (Oral)   Resp 18   SpO2 99%   41:52 AM 62 year old male with extensive past medical history which includes HIV, depression, substance abuse, throat cancer brought here from the hospital cafeteria in a wheelchair for evaluation of a witnessed syncopal episode.  Patient report he was at the cafeteria today when he felt lightheadedness and subsequently had a syncopal episode.  He did hit the back of his head and endorsed some mild throbbing pain to the back of his head.  He mentioned that he has been experiencing episodes of lightheadedness and  dizziness for the past 2 to 3 days.  Symptoms brought on by change in position.  Also mentioned that he has been losing weight rapidly and  is not having much of an appetite because he cannot taste his food well.  He is trying to eat.  He also noticed swelling of his right lower extremity for the past several days as well as having shortness of breath.  He is scheduled to have an ultrasound of her leg today and thus why he came to the hospital but states that the ultrasound appointment was canceled.  He is unsure why.  He does not endorse any exertional chest pain prior to his syncope.  Denies any neck pain or pain elsewhere.  No urinary symptoms.  On exam, patient has of a hoarse voice but this is not unusual for him.  He is resting comfortably while trying to shifting himself up in bed.  He does have some mild tenderness to the occipital scalp without significant signs of trauma.  No midline spine tenderness.  Heart with normal rate and rhythm, lungs are clear to auscultation abdomen is soft and nontender.  Weakness noted to his right lower extremity compared to left with associated edema to right lower extremity but no signs of cellulitis.  No foot drops.  Patient is globally weak.  Vital signs reviewed and overall reassuring.  Patient has precipitating symptoms prior to the fall, workup initiated.  -Labs ordered, independently viewed and interpreted by me.  Labs remarkable for WBC 0.6, Hgb 8.0 -The patient was maintained on a cardiac monitor.  I personally viewed and interpreted the cardiac monitored which showed an underlying rhythm of: NSR -Imaging independently viewed and interpreted by me and I agree with radiologist's interpretation.  Result remarkable for vascular US of LE showing no DVT -This patient presents to the ED for concern of fall, LOC, this involves an extensive number of treatment options, and is a complaint that carries with it a high risk of complications and morbidity.  The  differential diagnosis includes brain tumor, mechanical fall, stroke, electrolytes derangement, chemo induced fatigue -Co morbidities that complicate the patient evaluation includes cancer -Treatment includes none as pt left AMA -Reevaluation of the patient after these medicines showed that the patient stayed the same -PCP office notes or outside notes reviewed Pt left AMA.         Final Clinical Impression(s) / ED Diagnoses Final diagnoses:  Syncope and collapse    Rx / DC Orders ED Discharge Orders     None         [name], PA-C 06/22/22 1517    Melene Plan, DO 06/24/22 1501

## 2022-06-21 NOTE — Telephone Encounter (Signed)
CRITICAL VALUE STICKER  CRITICAL VALUE:  WBC 0.6, ANC 0.3  RECEIVER (on-site recipient of call): Preesha Benjamin P. LPN   DATE & TIME NOTIFIED: 06/20/22 138 pm  MESSENGER (representative from lab): Mindi Junker  MD NOTIFIED: Dr. Al Pimple  TIME OF NOTIFICATION: 140 pm  RESPONSE: No Treatment.

## 2022-06-22 ENCOUNTER — Ambulatory Visit: Payer: Medicare Other

## 2022-06-22 ENCOUNTER — Inpatient Hospital Stay: Payer: Medicare Other | Admitting: Dietician

## 2022-06-22 ENCOUNTER — Other Ambulatory Visit: Payer: Self-pay

## 2022-06-22 ENCOUNTER — Telehealth: Payer: Self-pay

## 2022-06-22 ENCOUNTER — Other Ambulatory Visit: Payer: Self-pay | Admitting: Adult Health

## 2022-06-22 ENCOUNTER — Encounter: Payer: Self-pay | Admitting: *Deleted

## 2022-06-22 ENCOUNTER — Telehealth: Payer: Self-pay | Admitting: Radiation Oncology

## 2022-06-22 DIAGNOSIS — C090 Malignant neoplasm of tonsillar fossa: Secondary | ICD-10-CM

## 2022-06-22 DIAGNOSIS — C760 Malignant neoplasm of head, face and neck: Secondary | ICD-10-CM

## 2022-06-22 DIAGNOSIS — G893 Neoplasm related pain (acute) (chronic): Secondary | ICD-10-CM

## 2022-06-22 MED ORDER — OXYCODONE HCL 5 MG PO TABS
5.0000 mg | ORAL_TABLET | ORAL | 0 refills | Status: DC | PRN
Start: 2022-06-22 — End: 2022-07-18

## 2022-06-22 NOTE — Telephone Encounter (Signed)
6/13 @ 8:35 am patient call upset about why his appointment has been cancel and need prescriptions refill also wanted to confirmed if he suppose to be here today for his treatments and the rest of this week and what time.  Confirmed date/time with patient.  He also requested to speak to Dr. Remonia Richter nurse and then Milagros Reap.  I let him know Miki Kins is not Dr. Colletta Maryland nurse, but he stated she always been a help to him.  Then he requested to speak to Dr. Remonia Richter nurse and was forward to voicemail and secure chat sent to Nelly Rout, LPN and Miki Kins so they aware.

## 2022-06-22 NOTE — Progress Notes (Signed)
CBC with diff ordered. Lab seat placed. Pt added back on to rad onc schedule for Friday.

## 2022-06-22 NOTE — Telephone Encounter (Signed)
RN left message for pt in an attempt to inform him that his Lidocaine Rx refill should be ready in an hour and a half at his pharmacy. Rn also included in the message left on voicemail that medical oncology was working on his refill for oxycodone. Finally Rn left details about a new plan for treatment that would include him coming in for labs tomorrow and potentially treatment if labs were ok. Rn left call back number for pt to callback. Rn will attempt to reach out to pt again if no return phone call is received.

## 2022-06-22 NOTE — Telephone Encounter (Signed)
Rn called pt to let him know that we will holding radiation treatments due to his low ANC count. He was informed that radiation treatments would resume on Monday the 17th. He understood this plan. He did also mention to RN that he remained weak and tired overall. He stated he felt "alone" in this journey with his family being away as well. Rn encouraged pt that we were there for him in this journey. Finally he mentioned he is beginning to get his sense of taste back and his food intake is starting to improve slightly. He was excited he was able to eat chicken yesterday. Rn is also reaching out the medical oncology team in assistance with refills for pt. Pt stated on the phone is completley out of his oxycodone and lidocaine solution. Rn will continue to follow up on these needs and concerns.

## 2022-06-22 NOTE — Progress Notes (Signed)
Refilling oxycodone #60 for cancer related pain.  PMP aware reviewed.    Lillard Anes, NP 06/22/22 11:44 AM Medical Oncology and Hematology [facility] [address] [place], [place] [address] Tel. [phone]    Fax. [phone]

## 2022-06-22 NOTE — Telephone Encounter (Signed)
Rn called pt to check to see if he got previous message and he stated he had not. Rn then informed pt that both of his medications should be ready at his pharmacy. Rn also discussed plan for labs and radiation tomorrow as well. He was agreeable to come in for labs and radiation treatment (if labs allow) tomorrow. Rn placed necessary orders and appointments.

## 2022-06-22 NOTE — Telephone Encounter (Signed)
Rn called pt back to inquire about how much Oxycodone he had left. He stated He had 6 left. RN relayed this information to RN [employer]. She will work with Lillard Anes NP on oxycodone refill.

## 2022-06-23 ENCOUNTER — Telehealth: Payer: Self-pay

## 2022-06-23 ENCOUNTER — Ambulatory Visit: Payer: Medicare Other

## 2022-06-23 ENCOUNTER — Telehealth: Payer: Self-pay | Admitting: *Deleted

## 2022-06-23 ENCOUNTER — Other Ambulatory Visit: Payer: Medicare Other

## 2022-06-23 NOTE — Telephone Encounter (Signed)
RN called pt and was finally able to contact pt. He reported that he is not coming today due to not feeling well. He reported leaving a message. Rn will now work on a plan for Monday for his cbc with diff and radiation treatment. Pt is requesting we move up radiation time for Monday due to the increased heat bothering him. Rn reaching out to [facility] and will arrange lab draw to work with this appointment.

## 2022-06-23 NOTE — Telephone Encounter (Signed)
Rn called pt when he did not arrive to this lab appointment that was discussed with pt yesterday. Pt did not answer the phone and detailed voicemail was left for pt.

## 2022-06-23 NOTE — Telephone Encounter (Signed)
RN called pt to inform him of his lab appointment at 0900 and radiation appointment at 1030. Pt understood these appointment times and stated he would be here. [name] to notify transportation services as well.

## 2022-06-23 NOTE — Telephone Encounter (Signed)
Rn still unable to reach pt on the phone. Pt has still not arrived for his lab appointment this. Rn will continue to attempt to reach out to pt.

## 2022-06-23 NOTE — Telephone Encounter (Signed)
CALLED PATIENT TO INFORM OF APPT. WITH DR. AMY STEVEN FOR 08-02-22- ARRIVAL TIME- 7:50 AM, ADDRESS- [id number], , [place] [id number], PH. NO. - [phone], SPOKE WITH PATIENT AND HE IS AWARE OF THIS APPT.

## 2022-06-23 NOTE — Telephone Encounter (Signed)
Rn attempted to call pt again without success (pt has not yet arrived for his lab appt). This lab appointment and potential for radiation therapy were discussed with the pt yesterday. He agreed he would come at that time. Rn has left message for pt on his voicemail and will continue to follow up with pt.

## 2022-06-24 ENCOUNTER — Emergency Department (HOSPITAL_COMMUNITY): Payer: Medicare Other

## 2022-06-24 ENCOUNTER — Other Ambulatory Visit: Payer: Self-pay

## 2022-06-24 ENCOUNTER — Inpatient Hospital Stay (HOSPITAL_COMMUNITY)
Admission: EM | Admit: 2022-06-24 | Discharge: 2022-06-30 | DRG: 975 | Disposition: A | Discharge status: Home / Self Care | Payer: Medicare Other | Attending: Internal Medicine | Admitting: Internal Medicine

## 2022-06-24 DIAGNOSIS — C4492 Squamous cell carcinoma of skin, unspecified: Secondary | ICD-10-CM

## 2022-06-24 DIAGNOSIS — E876 Hypokalemia: Secondary | ICD-10-CM | POA: Diagnosis present

## 2022-06-24 DIAGNOSIS — I10 Essential (primary) hypertension: Secondary | ICD-10-CM | POA: Diagnosis present

## 2022-06-24 DIAGNOSIS — F419 Anxiety disorder, unspecified: Secondary | ICD-10-CM | POA: Diagnosis present

## 2022-06-24 DIAGNOSIS — Z85818 Personal history of malignant neoplasm of other sites of lip, oral cavity, and pharynx: Secondary | ICD-10-CM | POA: Diagnosis not present

## 2022-06-24 DIAGNOSIS — A630 Anogenital (venereal) warts: Secondary | ICD-10-CM | POA: Diagnosis present

## 2022-06-24 DIAGNOSIS — Z7982 Long term (current) use of aspirin: Secondary | ICD-10-CM

## 2022-06-24 DIAGNOSIS — F17210 Nicotine dependence, cigarettes, uncomplicated: Secondary | ICD-10-CM | POA: Diagnosis not present

## 2022-06-24 DIAGNOSIS — Z91013 Allergy to seafood: Secondary | ICD-10-CM | POA: Diagnosis not present

## 2022-06-24 DIAGNOSIS — G992 Myelopathy in diseases classified elsewhere: Secondary | ICD-10-CM | POA: Diagnosis present

## 2022-06-24 DIAGNOSIS — J9811 Atelectasis: Secondary | ICD-10-CM | POA: Diagnosis present

## 2022-06-24 DIAGNOSIS — J189 Pneumonia, unspecified organism: Secondary | ICD-10-CM | POA: Diagnosis not present

## 2022-06-24 DIAGNOSIS — F1090 Alcohol use, unspecified, uncomplicated: Secondary | ICD-10-CM | POA: Diagnosis present

## 2022-06-24 DIAGNOSIS — C7989 Secondary malignant neoplasm of other specified sites: Secondary | ICD-10-CM | POA: Diagnosis present

## 2022-06-24 DIAGNOSIS — F1920 Other psychoactive substance dependence, uncomplicated: Secondary | ICD-10-CM | POA: Diagnosis present

## 2022-06-24 DIAGNOSIS — G8929 Other chronic pain: Secondary | ICD-10-CM | POA: Diagnosis present

## 2022-06-24 DIAGNOSIS — R918 Other nonspecific abnormal finding of lung field: Secondary | ICD-10-CM | POA: Diagnosis present

## 2022-06-24 DIAGNOSIS — Z882 Allergy status to sulfonamides status: Secondary | ICD-10-CM | POA: Diagnosis not present

## 2022-06-24 DIAGNOSIS — J188 Other pneumonia, unspecified organism: Secondary | ICD-10-CM

## 2022-06-24 DIAGNOSIS — Z79899 Other long term (current) drug therapy: Secondary | ICD-10-CM

## 2022-06-24 DIAGNOSIS — F1024 Alcohol dependence with alcohol-induced mood disorder: Secondary | ICD-10-CM | POA: Diagnosis present

## 2022-06-24 DIAGNOSIS — R531 Weakness: Secondary | ICD-10-CM

## 2022-06-24 DIAGNOSIS — E785 Hyperlipidemia, unspecified: Secondary | ICD-10-CM | POA: Diagnosis present

## 2022-06-24 DIAGNOSIS — C090 Malignant neoplasm of tonsillar fossa: Secondary | ICD-10-CM | POA: Diagnosis present

## 2022-06-24 DIAGNOSIS — B20 Human immunodeficiency virus [HIV] disease: Secondary | ICD-10-CM | POA: Diagnosis not present

## 2022-06-24 DIAGNOSIS — F1014 Alcohol abuse with alcohol-induced mood disorder: Secondary | ICD-10-CM | POA: Diagnosis present

## 2022-06-24 DIAGNOSIS — Z720 Tobacco use: Secondary | ICD-10-CM | POA: Diagnosis present

## 2022-06-24 DIAGNOSIS — Z87891 Personal history of nicotine dependence: Secondary | ICD-10-CM

## 2022-06-24 DIAGNOSIS — M1711 Unilateral primary osteoarthritis, right knee: Secondary | ICD-10-CM | POA: Diagnosis present

## 2022-06-24 DIAGNOSIS — F32A Depression, unspecified: Secondary | ICD-10-CM | POA: Diagnosis present

## 2022-06-24 DIAGNOSIS — Z560 Unemployment, unspecified: Secondary | ICD-10-CM

## 2022-06-24 DIAGNOSIS — R0602 Shortness of breath: Secondary | ICD-10-CM | POA: Diagnosis present

## 2022-06-24 DIAGNOSIS — F1990 Other psychoactive substance use, unspecified, uncomplicated: Secondary | ICD-10-CM | POA: Diagnosis present

## 2022-06-24 DIAGNOSIS — R5081 Fever presenting with conditions classified elsewhere: Secondary | ICD-10-CM | POA: Diagnosis not present

## 2022-06-24 DIAGNOSIS — D709 Neutropenia, unspecified: Secondary | ICD-10-CM | POA: Diagnosis not present

## 2022-06-24 DIAGNOSIS — L8990 Pressure ulcer of unspecified site, unspecified stage: Secondary | ICD-10-CM | POA: Insufficient documentation

## 2022-06-24 DIAGNOSIS — F332 Major depressive disorder, recurrent severe without psychotic features: Secondary | ICD-10-CM | POA: Diagnosis present

## 2022-06-24 DIAGNOSIS — I509 Heart failure, unspecified: Secondary | ICD-10-CM | POA: Insufficient documentation

## 2022-06-24 LAB — CBC WITH DIFFERENTIAL/PLATELET
Abs Immature Granulocytes: 0.17 10*3/uL — ABNORMAL HIGH (ref 0.00–0.07)
Basophils Absolute: 0 10*3/uL (ref 0.0–0.1)
Basophils Relative: 1 %
Eosinophils Absolute: 0 10*3/uL (ref 0.0–0.5)
Eosinophils Relative: 0 %
HCT: 27.6 % — ABNORMAL LOW (ref 39.0–52.0)
Hemoglobin: 8.7 g/dL — ABNORMAL LOW (ref 13.0–17.0)
Immature Granulocytes: 7 %
Lymphocytes Relative: 5 %
Lymphs Abs: 0.1 10*3/uL — ABNORMAL LOW (ref 0.7–4.0)
MCH: 26.8 pg (ref 26.0–34.0)
MCHC: 31.5 g/dL (ref 30.0–36.0)
MCV: 84.9 fL (ref 80.0–100.0)
Monocytes Absolute: 0.4 10*3/uL (ref 0.1–1.0)
Monocytes Relative: 18 %
Neutro Abs: 1.6 10*3/uL — ABNORMAL LOW (ref 1.7–7.7)
Neutrophils Relative %: 69 %
Platelets: 394 10*3/uL (ref 150–400)
RBC: 3.25 MIL/uL — ABNORMAL LOW (ref 4.22–5.81)
RDW: 18.4 % — ABNORMAL HIGH (ref 11.5–15.5)
WBC: 2.4 10*3/uL — ABNORMAL LOW (ref 4.0–10.5)
nRBC: 0 % (ref 0.0–0.2)

## 2022-06-24 LAB — TROPONIN I (HIGH SENSITIVITY): Troponin I (High Sensitivity): 12 ng/L (ref <18)

## 2022-06-24 LAB — BASIC METABOLIC PANEL
Anion gap: 9 (ref 5–15)
BUN: 22 mg/dL (ref 8–23)
CO2: 27 mmol/L (ref 22–32)
Calcium: 8.1 mg/dL — ABNORMAL LOW (ref 8.9–10.3)
Chloride: 97 mmol/L — ABNORMAL LOW (ref 98–111)
Creatinine, Ser: 0.95 mg/dL (ref 0.61–1.24)
GFR, Estimated: >60 mL/min (ref >60)
Glucose, Bld: 118 mg/dL — ABNORMAL HIGH (ref 70–99)
Potassium: 3.3 mmol/L — ABNORMAL LOW (ref 3.5–5.1)
Sodium: 133 mmol/L — ABNORMAL LOW (ref 135–145)

## 2022-06-24 LAB — BRAIN NATRIURETIC PEPTIDE: B Natriuretic Peptide: 84.1 pg/mL (ref 0.0–100.0)

## 2022-06-24 LAB — CREATININE, SERUM
Creatinine, Ser: 0.92 mg/dL (ref 0.61–1.24)
GFR, Estimated: >60 mL/min (ref >60)

## 2022-06-24 MED ORDER — OXYCODONE HCL 5 MG PO TABS
5.0000 mg | ORAL_TABLET | ORAL | Status: DC | PRN
  Administered 2022-06-24 – 2022-06-28 (×10): 5 mg via ORAL
  Filled 2022-06-24 (×10): qty 1

## 2022-06-24 MED ORDER — ACETAMINOPHEN 650 MG RE SUPP: 650.0000 mg | Freq: Four times a day (QID) | RECTAL | Status: DC | PRN

## 2022-06-24 MED ORDER — DARUN-COBIC-EMTRICIT-TENOFAF 800-150-200-10 MG PO TABS
1.0000 | ORAL_TABLET | Freq: Every day | ORAL | Status: DC
  Administered 2022-06-25 – 2022-06-29 (×5): 1 via ORAL
  Filled 2022-06-24 (×6): qty 1

## 2022-06-24 MED ORDER — ROSUVASTATIN CALCIUM 5 MG PO TABS
5.0000 mg | ORAL_TABLET | Freq: Every day | ORAL | Status: DC
  Administered 2022-06-25 – 2022-06-30 (×6): 5 mg via ORAL
  Filled 2022-06-24 (×6): qty 1

## 2022-06-24 MED ORDER — ACETAMINOPHEN 325 MG PO TABS
650.0000 mg | ORAL_TABLET | Freq: Four times a day (QID) | ORAL | Status: DC | PRN
  Administered 2022-06-27: 650 mg via ORAL
  Filled 2022-06-24: qty 2

## 2022-06-24 MED ORDER — ONDANSETRON HCL 4 MG PO TABS: 4.0000 mg | ORAL_TABLET | Freq: Four times a day (QID) | ORAL | Status: DC | PRN

## 2022-06-24 MED ORDER — SODIUM CHLORIDE 0.9 % IV SOLN
1.0000 g | Freq: Once | INTRAVENOUS | Status: AC
  Administered 2022-06-24: 1 g via INTRAVENOUS
  Filled 2022-06-24: qty 10

## 2022-06-24 MED ORDER — GABAPENTIN 400 MG PO CAPS
800.0000 mg | ORAL_CAPSULE | Freq: Three times a day (TID) | ORAL | Status: DC
  Administered 2022-06-25 – 2022-06-30 (×16): 800 mg via ORAL
  Filled 2022-06-24 (×16): qty 2

## 2022-06-24 MED ORDER — ONDANSETRON HCL 4 MG/2ML IJ SOLN
4.0000 mg | Freq: Four times a day (QID) | INTRAMUSCULAR | Status: DC | PRN
  Administered 2022-06-27: 4 mg via INTRAVENOUS
  Filled 2022-06-24: qty 2

## 2022-06-24 MED ORDER — ENOXAPARIN SODIUM 40 MG/0.4ML IJ SOSY
40.0000 mg | PREFILLED_SYRINGE | INTRAMUSCULAR | Status: DC
  Administered 2022-06-25 – 2022-06-29 (×5): 40 mg via SUBCUTANEOUS
  Filled 2022-06-24 (×6): qty 0.4

## 2022-06-24 MED ORDER — BUPROPION HCL ER (XL) 150 MG PO TB24
300.0000 mg | ORAL_TABLET | Freq: Every day | ORAL | Status: DC
  Administered 2022-06-25 – 2022-06-30 (×6): 300 mg via ORAL
  Filled 2022-06-24 (×4): qty 1
  Filled 2022-06-24: qty 2
  Filled 2022-06-24: qty 1

## 2022-06-24 MED ORDER — LOSARTAN POTASSIUM 25 MG PO TABS
25.0000 mg | ORAL_TABLET | Freq: Every day | ORAL | Status: DC
  Administered 2022-06-25 – 2022-06-26 (×2): 25 mg via ORAL
  Filled 2022-06-24 (×2): qty 1

## 2022-06-24 MED ORDER — SODIUM CHLORIDE 0.9 % IV SOLN
100.0000 mg | Freq: Once | INTRAVENOUS | Status: AC
  Administered 2022-06-25: 100 mg via INTRAVENOUS
  Filled 2022-06-24: qty 100

## 2022-06-24 MED ORDER — CLONIDINE HCL 0.1 MG PO TABS
0.1000 mg | ORAL_TABLET | Freq: Two times a day (BID) | ORAL | Status: DC
  Administered 2022-06-25 – 2022-06-26 (×4): 0.1 mg via ORAL
  Filled 2022-06-24 (×4): qty 1

## 2022-06-24 NOTE — ED Provider Notes (Signed)
[place] EMERGENCY DEPARTMENT AT [facility] Provider Note   CSN: [id number] Arrival date & time: 06/24/22  1944     History  Chief Complaint  Patient presents with   Shortness of Breath    [name] is a 62 y.o. male with a history of oropharyngeal cancer, tonsillar mass, on radiation therapy, presented to ED with complaint of persistent and productive cough and weakness.  Patient reports abrupt began feeling weak about 3 days ago.  He says he is a lot of phlegm, swelling bilaterally in his legs, very short of breath, coughing constantly.  He reports pleuritic chest pain.  He says he is not currently on chemotherapy because "my blood cell counts got too low".  Per review of care everywhere records, 3 to 4 days ago the patient was noted to have worsening leukopenia with white blood cell count 0.6, hgb 8.0 (from 8.5), platelets 122, abs neutopril 0.3.  HPI     Home Medications Prior to Admission medications   Medication Sig Start Date End Date Taking? Authorizing Provider  aprepitant (EMEND) 80 & 125 MG capsule Take by mouth. 04/11/22   [provider]  aspirin EC 81 MG tablet Take 81 mg by mouth daily.    [provider]  baclofen (LIORESAL) 20 MG tablet Take 20 mg by mouth 3 (three) times daily as needed for muscle spasms. 06/16/19   [provider]  buPROPion (WELLBUTRIN XL) 300 MG 24 hr tablet Take 1 tablet (300 mg total) by mouth daily. 06/06/22   Lonie Peak, MD  cloNIDine (CATAPRES) 0.1 MG tablet Take 0.1 mg by mouth 2 (two) times daily.    [provider]  Darunavir-Cobicistat-Emtricitabine-Tenofovir Alafenamide (SYMTUZA) 800-150-200-10 MG TABS Take 1 tablet by mouth daily with breakfast. 05/11/22   [name], MD  dexamethasone (DECADRON) 4 MG tablet Take 2 tablets (8 mg) by mouth daily x 3 days starting the day after cisplatin chemotherapy. Take with food. 06/09/22   Loa Socks, NP  diphenhydrAMINE (BENADRYL)  25 MG tablet Take 25 mg by mouth every 6 (six) hours as needed for sleep.    [provider]  fluconazole (DIFLUCAN) 100 MG tablet Take 2 tablets today, then 1 tablet daily x 20 more days. Hold Rosuvastatin while on this. 06/09/22   Loa Socks, NP  gabapentin (NEURONTIN) 800 MG tablet Take 1 tablet (800 mg total) by mouth 3 (three) times daily. 05/23/22   Lonie Peak, MD  granisetron (KYTRIL) 1 MG tablet Take by mouth. 04/10/22   [provider]  lidocaine (XYLOCAINE) 2 % solution Patient: Mix 1part 2% viscous lidocaine, 1part H20. Swish & swallow 10mL of diluted mixture, before meals and at bedtime, up to QID 06/09/22   Loa Socks, NP  lidocaine-prilocaine (EMLA) cream Apply to affected area once 05/25/22   Rachel Moulds, MD  LORazepam (ATIVAN) 0.5 MG tablet Take one tablet - dissolve under tongue - PRN claustrophobia, 20 min prior to radiation. Patient not taking: Reported on 06/15/2022 04/28/22   Lonie Peak, MD  losartan (COZAAR) 25 MG tablet Take 1 tablet (25 mg total) by mouth daily. 06/06/22   Lonie Peak, MD  ondansetron (ZOFRAN) 8 MG tablet Take 1 tablet (8 mg total) by mouth every 8 (eight) hours as needed for nausea or vomiting. Start on the third day after cisplatin. 05/25/22   Rachel Moulds, MD  oxyCODONE (OXY IR/ROXICODONE) 5 MG immediate release tablet Take 1-2 tablets (5-10 mg total) by mouth every 4 (  four) hours as needed for severe pain. 06/22/22   Loa Socks, NP  polyethylene glycol powder (GLYCOLAX) 17 GM/SCOOP powder Take 1/2 to 1 capsule dissolved in liquid daily for bowel regulation while on pain medication 06/15/22   Loa Socks, NP  prochlorperazine (COMPAZINE) 10 MG tablet Take 1 tablet (10 mg total) by mouth every 6 (six) hours as needed (Nausea or vomiting). 05/25/22   Rachel Moulds, MD  rosuvastatin (CRESTOR) 5 MG tablet Take 5 mg by mouth daily. 02/13/22   [provider]  senna-docusate  (SENOKOT-S) 8.6-50 MG tablet Take 1 tablet by mouth 2 (two) times daily. 06/15/22   Loa Socks, NP  valACYclovir (VALTREX) 500 MG tablet Take 1 tablet (500 mg total) by mouth 2 (two) times daily. 06/09/22   Loa Socks, NP      Allergies    Shellfish allergy, Sulfa antibiotics, Fish allergy, and Clindamycin    Review of Systems   Review of Systems  Physical Exam Updated Vital Signs BP (!) 127/111   Pulse 99   Temp 98.7 F (37.1 C) (Oral)   Resp 19   SpO2 98%  Physical Exam Constitutional:      General: He is not in acute distress. HENT:     Head: Normocephalic and atraumatic.  Eyes:     Conjunctiva/sclera: Conjunctivae normal.     Pupils: Pupils are equal, round, and reactive to light.  Cardiovascular:     Rate and Rhythm: Normal rate and regular rhythm.  Pulmonary:     Effort: Pulmonary effort is normal. No respiratory distress.     Comments: Rhonchi in the bilateral middle lower lobe, productive cough Abdominal:     General: There is no distension.     Tenderness: There is no abdominal tenderness.  Musculoskeletal:     Comments: Symmetrical pitting edema of the lower extremities  Skin:    General: Skin is warm and dry.  Neurological:     General: No focal deficit present.     Mental Status: He is alert. Mental status is at baseline.  Psychiatric:        Mood and Affect: Mood normal.        Behavior: Behavior normal.     ED Results / Procedures / Treatments   Labs (all labs ordered are listed, but only abnormal results are displayed) Labs Reviewed  BASIC METABOLIC PANEL - Abnormal; Notable for the following components:      Result Value   Sodium 133 (*)    Potassium 3.3 (*)    Chloride 97 (*)    Glucose, Bld 118 (*)    Calcium 8.1 (*)    All other components within normal limits  CBC WITH DIFFERENTIAL/PLATELET - Abnormal; Notable for the following components:   WBC 2.4 (*)    RBC 3.25 (*)    Hemoglobin 8.7 (*)    HCT 27.6 (*)     RDW 18.4 (*)    Neutro Abs 1.6 (*)    Lymphs Abs 0.1 (*)    Abs Immature Granulocytes 0.17 (*)    All other components within normal limits  BRAIN NATRIURETIC PEPTIDE  CREATININE, SERUM  BASIC METABOLIC PANEL  CBC  CBC  TROPONIN I (HIGH SENSITIVITY)  TROPONIN I (HIGH SENSITIVITY)    EKG EKG Interpretation  Date/Time:  Saturday June 24 2022 20:44:16 EDT Ventricular Rate:  95 PR Interval:  100 QRS Duration: 94 QT Interval:  352 QTC Calculation: 443 R Axis:   74 Text Interpretation:  Sinus rhythm Short PR interval LVH with secondary repolarization abnormality Confirmed by Alvester Chou [phone]) on 06/24/2022 10:37:22 PM  Radiology DG Chest 2 View  Result Date: 06/24/2022 CLINICAL DATA:  Shortness of breath two days EXAM: CHEST - 2 VIEW COMPARISON:  04/22/2022 FINDINGS: Cardiac shadow is within normal limits. Right chest wall port is again seen. Calcified loose body is noted about the right shoulder joint stable from the prior study. New left basilar atelectasis is seen without sizable effusion. IMPRESSION: Left basilar atelectasis. Electronically Signed   By: Alcide Clever M.D.   On: 06/24/2022 21:09    Procedures Procedures    Medications Ordered in ED Medications  cefTRIAXone (ROCEPHIN) 1 g in sodium chloride 0.9 % 100 mL IVPB (1 g Intravenous New Bag/Given 06/24/22 2208)  doxycycline (VIBRAMYCIN) 100 mg in sodium chloride 0.9 % 250 mL IVPB (has no administration in time range)  Darunavir-Cobicistat-Emtricitabine-Tenofovir Alafenamide (SYMTUZA) 800-150-200-10 MG TABS 1 tablet (has no administration in time range)  cloNIDine (CATAPRES) tablet 0.1 mg (has no administration in time range)  losartan (COZAAR) tablet 25 mg (has no administration in time range)  rosuvastatin (CRESTOR) tablet 5 mg (has no administration in time range)  buPROPion (WELLBUTRIN XL) 24 hr tablet 300 mg (has no administration in time range)  gabapentin (NEURONTIN) tablet 800 mg (has no administration in  time range)  enoxaparin (LOVENOX) injection 40 mg (has no administration in time range)  acetaminophen (TYLENOL) tablet 650 mg (has no administration in time range)    Or  acetaminophen (TYLENOL) suppository 650 mg (has no administration in time range)  oxyCODONE (Oxy IR/ROXICODONE) immediate release tablet 5 mg (has no administration in time range)  ondansetron (ZOFRAN) tablet 4 mg (has no administration in time range)    Or  ondansetron (ZOFRAN) injection 4 mg (has no administration in time range)    ED Course/ Medical Decision Making/ A&P Clinical Course as of 06/24/22 2238  Sat Jun 24, 2022  2223 Admitted to hospitalist for suspected PNA.  Patient is afebrile, does not have evidence of neutropenic fever at this time.  He is not requiring blood cultures. [MT]    Clinical Course User Index [MT] Dorthy Hustead, Kermit Balo, MD                             Medical Decision Making Amount and/or Complexity of Data Reviewed Labs: ordered. Radiology: ordered. ECG/medicine tests: ordered.  Risk Decision regarding hospitalization.   This patient presents to the ED with concern for cough, shortness of breath, extremity edema. This involves an extensive number of treatment options, and is a complaint that carries with it a high risk of complications and morbidity.  The differential diagnosis includes new onset congestive heart failure versus pleural effusion versus bacterial pneumonia versus viral URI versus other  Patient reporting negative COVID test performed as an outpatient.  Co-morbidities that complicate the patient evaluation: Immunocompromised with cancer treatment, at high risk of infection  External records from outside source obtained and reviewed including outpatient records from oncology office including recent lab work earlier this week  I ordered and personally interpreted labs.  The pertinent results include:  wbc 2.4, hgb stable 8.7, neut# 1.6, BMP unremarkable, trop and BNP  wnl  I ordered imaging studies including dg chest I independently visualized and interpreted imaging which showed left lower lobe atelectasis versus infection I agree with the radiologist interpretation  The patient was maintained on a cardiac monitor.  I personally viewed and interpreted the cardiac monitored which showed an underlying rhythm of: Sinus rhythm and sinus tachycardia  Per my interpretation the patient's ECG shows sinus tachycardia with no acute ischemic findings  I ordered medication including antibiotics for suspected community pneumonia  I have reviewed the patients home medicines and have made adjustments as needed  Test Considered: Lower suspicion for acute PE in this clinical setting.  Given his productive cough and his x-ray findings I suspect is more likely an infection of the lung.  After the interventions noted above, I reevaluated the patient and found that they have: stayed the same  Patient remained stable on room air but continues to have a persistent productive cough.  Plan for medical admission  Dispostion:  After consideration of the diagnostic results and the patients response to treatment, I feel that the patent would benefit from hospitalization.         Final Clinical Impression(s) / ED Diagnoses Final diagnoses:  Shortness of breath  Pneumonia of lower lobe due to infectious organism, unspecified laterality    Rx / DC Orders ED Discharge Orders     None         Terald Sleeper, MD 06/24/22 2238

## 2022-06-24 NOTE — ED Triage Notes (Signed)
Pt BIB [facility] from home. Pt c/o flu like symptoms that started 2 days ago. Pt c/o shortness of breath and productive cough.   100% 4L [place] 113/66 100HR 16RR

## 2022-06-25 ENCOUNTER — Inpatient Hospital Stay (HOSPITAL_COMMUNITY): Payer: Medicare Other

## 2022-06-25 ENCOUNTER — Encounter (HOSPITAL_COMMUNITY): Payer: Self-pay | Admitting: Internal Medicine

## 2022-06-25 DIAGNOSIS — J189 Pneumonia, unspecified organism: Secondary | ICD-10-CM | POA: Diagnosis not present

## 2022-06-25 DIAGNOSIS — D709 Neutropenia, unspecified: Secondary | ICD-10-CM | POA: Diagnosis not present

## 2022-06-25 DIAGNOSIS — R5081 Fever presenting with conditions classified elsewhere: Secondary | ICD-10-CM | POA: Diagnosis not present

## 2022-06-25 LAB — RAPID URINE DRUG SCREEN, HOSP PERFORMED
Amphetamines: NOT DETECTED
Barbiturates: NOT DETECTED
Benzodiazepines: NOT DETECTED
Cocaine: NOT DETECTED
Opiates: POSITIVE — AB
Tetrahydrocannabinol: NOT DETECTED

## 2022-06-25 LAB — BASIC METABOLIC PANEL
Anion gap: 8 (ref 5–15)
BUN: 16 mg/dL (ref 8–23)
CO2: 28 mmol/L (ref 22–32)
Calcium: 8.2 mg/dL — ABNORMAL LOW (ref 8.9–10.3)
Chloride: 98 mmol/L (ref 98–111)
Creatinine, Ser: 0.95 mg/dL (ref 0.61–1.24)
GFR, Estimated: >60 mL/min (ref >60)
Glucose, Bld: 120 mg/dL — ABNORMAL HIGH (ref 70–99)
Potassium: 3.7 mmol/L (ref 3.5–5.1)
Sodium: 134 mmol/L — ABNORMAL LOW (ref 135–145)

## 2022-06-25 LAB — CBC
HCT: 26.1 % — ABNORMAL LOW (ref 39.0–52.0)
HCT: 29.6 % — ABNORMAL LOW (ref 39.0–52.0)
Hemoglobin: 8.3 g/dL — ABNORMAL LOW (ref 13.0–17.0)
Hemoglobin: 9.1 g/dL — ABNORMAL LOW (ref 13.0–17.0)
MCH: 26.8 pg (ref 26.0–34.0)
MCH: 27.4 pg (ref 26.0–34.0)
MCHC: 30.7 g/dL (ref 30.0–36.0)
MCHC: 31.8 g/dL (ref 30.0–36.0)
MCV: 86.1 fL (ref 80.0–100.0)
MCV: 87.1 fL (ref 80.0–100.0)
Platelets: 393 10*3/uL (ref 150–400)
Platelets: 434 10*3/uL — ABNORMAL HIGH (ref 150–400)
RBC: 3.03 MIL/uL — ABNORMAL LOW (ref 4.22–5.81)
RBC: 3.4 MIL/uL — ABNORMAL LOW (ref 4.22–5.81)
RDW: 18.6 % — ABNORMAL HIGH (ref 11.5–15.5)
RDW: 18.6 % — ABNORMAL HIGH (ref 11.5–15.5)
WBC: 3.4 10*3/uL — ABNORMAL LOW (ref 4.0–10.5)
WBC: 3.4 10*3/uL — ABNORMAL LOW (ref 4.0–10.5)
nRBC: 0 % (ref 0.0–0.2)
nRBC: 0 % (ref 0.0–0.2)

## 2022-06-25 LAB — TROPONIN I (HIGH SENSITIVITY): Troponin I (High Sensitivity): 12 ng/L (ref <18)

## 2022-06-25 MED ORDER — THIAMINE HCL 100 MG/ML IJ SOLN
100.0000 mg | Freq: Every day | INTRAMUSCULAR | Status: DC
  Administered 2022-06-25 – 2022-06-27 (×2): 100 mg via INTRAVENOUS
  Filled 2022-06-25 (×2): qty 2

## 2022-06-25 MED ORDER — FOLIC ACID 1 MG PO TABS
1.0000 mg | ORAL_TABLET | Freq: Every day | ORAL | Status: DC
  Administered 2022-06-26 – 2022-06-30 (×5): 1 mg via ORAL
  Filled 2022-06-25 (×5): qty 1

## 2022-06-25 MED ORDER — SODIUM CHLORIDE 0.9 % IV SOLN
2.0000 g | Freq: Three times a day (TID) | INTRAVENOUS | Status: DC
  Administered 2022-06-25 – 2022-06-28 (×11): 2 g via INTRAVENOUS
  Filled 2022-06-25 (×11): qty 12.5

## 2022-06-25 MED ORDER — LORAZEPAM 2 MG/ML IJ SOLN
1.0000 mg | INTRAMUSCULAR | Status: DC | PRN
  Administered 2022-06-25: 3 mg via INTRAVENOUS
  Filled 2022-06-25: qty 2

## 2022-06-25 MED ORDER — ADULT MULTIVITAMIN W/MINERALS CH
1.0000 | ORAL_TABLET | Freq: Every day | ORAL | Status: DC
  Administered 2022-06-26 – 2022-06-30 (×5): 1 via ORAL
  Filled 2022-06-25 (×5): qty 1

## 2022-06-25 MED ORDER — CHLORHEXIDINE GLUCONATE CLOTH 2 % EX PADS
6.0000 | MEDICATED_PAD | Freq: Every day | CUTANEOUS | Status: DC
  Administered 2022-06-25 – 2022-06-30 (×6): 6 via TOPICAL

## 2022-06-25 MED ORDER — POTASSIUM CHLORIDE 20 MEQ PO PACK
40.0000 meq | PACK | Freq: Once | ORAL | Status: AC
  Administered 2022-06-25: 40 meq via ORAL
  Filled 2022-06-25: qty 2

## 2022-06-25 MED ORDER — SODIUM CHLORIDE 0.9% FLUSH: 10.0000 mL | INTRAVENOUS | Status: DC | PRN

## 2022-06-25 MED ORDER — THIAMINE MONONITRATE 100 MG PO TABS
100.0000 mg | ORAL_TABLET | Freq: Every day | ORAL | Status: DC
  Administered 2022-06-26 – 2022-06-30 (×4): 100 mg via ORAL
  Filled 2022-06-25 (×5): qty 1

## 2022-06-25 MED ORDER — MAGIC MOUTHWASH W/LIDOCAINE
5.0000 mL | Freq: Four times a day (QID) | ORAL | Status: DC
  Administered 2022-06-25 – 2022-06-30 (×11): 5 mL via ORAL
  Filled 2022-06-25 (×24): qty 5

## 2022-06-25 MED ORDER — LORAZEPAM 1 MG PO TABS: 1.0000 mg | ORAL_TABLET | ORAL | Status: DC | PRN

## 2022-06-25 MED ORDER — ORAL CARE MOUTH RINSE: 15.0000 mL | OROMUCOSAL | Status: DC | PRN

## 2022-06-25 NOTE — Progress Notes (Signed)
Pt gave RN pack of cigarettes and a bottle of prescribed oxycodone. Cigarettes locked in security bin on [place] side. Bottle of medication counted in front of patient and taken to pharmacy for safe keeping until discharge. Pt verbalized understanding of the no smoking policy and to not leave the unit.

## 2022-06-25 NOTE — Progress Notes (Signed)
Patient admitted after MN. 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  Chest x-ray shows left basilar atelectasis.  Chest CT is pending. Continue vancomycin and cefepime.  Follow-up blood cultures.

## 2022-06-25 NOTE — H&P (Signed)
History and Physical    [name] ZOX:[id number] DOB: 1960/04/28 DOA: 06/24/2022  PCP: Oneita Hurt, No   Chief Complaint: sob  HPI: [name] is a 62 y.o. male with medical history significant of with history of HIV, Oropharyngeal malignancy status postchemotherapy who presents emergency department with cough and weakness.  Patient receiving chemotherapy however was put on hold due to neutropenia.  His labs several days ago demonstrated neutrophil count of 300.  He has not been on prophylactic antibiotics.  He presented to the ER with chest pain and cough.  He was found to be afebrile and hemodynamically stable.  Labs were obtained on presentation which showed sodium 133, potassium 3.3, wbc 2.4, hgb 8.7. cxr showed possible atelectasis.  BNP 84, trop 12.  Patient was started on Ceftin and doxycycline and admitted for further management. Neck pain on admission patient had productive cough that was painful.  Due to history of neutropenia he was transitioned to vancomycin and cefepime.  Patient endorses fever at home.  CT chest was ordered to further assess presence of infiltrates.  Other than shortness of breath, fever, and cough he denies other complaints.    Review of Systems: Review of Systems  All other systems reviewed and are negative.    As per HPI otherwise 10 point review of systems negative.   Allergies  Allergen Reactions   Shellfish Allergy Anaphylaxis and Swelling    Swelling everywhere   Sulfa Antibiotics Anaphylaxis, Hives, Itching and Swelling    Swelling all over    Fish Allergy Swelling   Clindamycin Diarrhea    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   Hemorrhoids    HIV (human immunodeficiency virus infection) (HCC)    Stenosis of cervical spine    withb myelopathy    Past Surgical History:  Procedure Laterality Date   ANTERIOR CERVICAL DECOMP/DISCECTOMY FUSION N/A 07/06/2017   Procedure: ANTERIOR CERVICAL DECOMPRESSION/DISCECTOMY  FUSION C3-4/C4-5 2 LEVELS;  Surgeon: Lisbeth Renshaw, MD;  Location: [facility] OR;  Service: Neurosurgery;  Laterality: N/A;   HERNIA REPAIR  97   lt ing   IR IMAGING GUIDED PORT INSERTION  05/19/2022   RECTAL EXAM UNDER ANESTHESIA N/A 04/24/2014   Procedure:  ligation of bleeding vessels;  Surgeon: Almond Lint, MD;  Location: [facility]S;  Service: General;  Laterality: N/A;   TENDON REPAIR     2006-lt index   WART FULGURATION  07/06/2011   Procedure: FULGURATION ANAL WART;  Surgeon: Shelly Rubenstein, MD;  Location: [facility];  Service: General;  Laterality: N/A;  excision anal condyloma   WART FULGURATION N/A 04/23/2014   Procedure: EXCISION OF ANAL CONDYLOMA;  Surgeon: Abigail Miyamoto, MD;  Location: [facility];  Service: General;  Laterality: N/A;     reports that he has been smoking cigarettes. He has a 12.50 pack-year smoking history. He has never used smokeless tobacco. He reports current alcohol use of about 24.0 standard drinks of alcohol per week. He reports current drug use. Drug: "Crack" cocaine.  Family History  Problem Relation Age of Onset   Cancer Mother        brain   Cancer Sister        breast    Prior to Admission medications   Medication Sig Start Date End Date Taking? Authorizing Provider  aprepitant (EMEND) 80 & 125 MG capsule Take by mouth. 04/11/22  Yes [provider]  aspirin EC 81 MG tablet Take 81 mg by mouth daily.  Yes [provider]  buPROPion (WELLBUTRIN XL) 300 MG 24 hr tablet Take 1 tablet (300 mg total) by mouth daily. 06/06/22  Yes Lonie Peak, MD  cloNIDine (CATAPRES) 0.1 MG tablet Take 0.1 mg by mouth daily.   Yes [provider]  Darunavir-Cobicistat-Emtricitabine-Tenofovir Alafenamide (SYMTUZA) 800-150-200-10 MG TABS Take 1 tablet by mouth daily with breakfast. 05/11/22  Yes [name], MD  gabapentin (NEURONTIN) 800 MG tablet Take 1 tablet (800 mg total) by mouth 3 (three) times daily. 05/23/22  Yes Lonie Peak, MD  losartan (COZAAR) 25 MG tablet Take 1 tablet (25 mg total) by mouth daily. 06/06/22  Yes Lonie Peak, MD  oxyCODONE (OXY IR/ROXICODONE) 5 MG immediate release tablet Take 1-2 tablets (5-10 mg total) by mouth every 4 (four) hours as needed for severe pain. 06/22/22  Yes Causey, Larna Daughters, NP  polyethylene glycol powder (GLYCOLAX) 17 GM/SCOOP powder Take 1/2 to 1 capsule dissolved in liquid daily for bowel regulation while on pain medication 06/15/22  Yes Causey, Larna Daughters, NP  prochlorperazine (COMPAZINE) 10 MG tablet Take 1 tablet (10 mg total) by mouth every 6 (six) hours as needed (Nausea or vomiting). 05/25/22  Yes Rachel Moulds, MD  rosuvastatin (CRESTOR) 5 MG tablet Take 5 mg by mouth daily. 02/13/22  Yes [provider]  senna-docusate (SENOKOT-S) 8.6-50 MG tablet Take 1 tablet by mouth 2 (two) times daily. 06/15/22  Yes Causey, Larna Daughters, NP  valACYclovir (VALTREX) 500 MG tablet Take 1 tablet (500 mg total) by mouth 2 (two) times daily. 06/09/22  Yes Causey, Larna Daughters, NP  dexamethasone (DECADRON) 4 MG tablet Take 2 tablets (8 mg) by mouth daily x 3 days starting the day after cisplatin chemotherapy. Take with food. 06/09/22   Loa Socks, NP  fluconazole (DIFLUCAN) 100 MG tablet Take 2 tablets today, then 1 tablet daily x 20 more days. Hold Rosuvastatin while on this. 06/09/22   Loa Socks, NP  lidocaine (XYLOCAINE) 2 % solution Patient: Mix 1part 2% viscous lidocaine, 1part H20. Swish & swallow 10mL of diluted mixture, before meals and at bedtime, up to QID Patient not taking: Reported on 06/24/2022 06/09/22   Loa Socks, NP  lidocaine-prilocaine (EMLA) cream Apply to affected area once Patient not taking: Reported on 06/24/2022 05/25/22   Rachel Moulds, MD    Physical Exam: Vitals:   06/24/22 1956 06/24/22 1957 06/24/22 2337  BP: (!) 127/111  121/71  Pulse: 99  85  Resp:  19 17  Temp: 98.7 F (37.1  C)  98.9 F (37.2 C)  TempSrc: Oral  Oral  SpO2: 98%  96%   Physical Exam Vitals reviewed.  Constitutional:      Appearance: He is normal weight.  HENT:     Head: Normocephalic.     Mouth/Throat:     Mouth: Mucous membranes are moist.  Eyes:     Pupils: Pupils are equal, round, and reactive to light.  Cardiovascular:     Rate and Rhythm: Normal rate and regular rhythm.  Pulmonary:     Effort: Pulmonary effort is normal.     Breath sounds: Normal breath sounds.  Abdominal:     Palpations: Abdomen is soft.  Musculoskeletal:        General: Normal range of motion.     Cervical back: Normal range of motion.  Skin:    General: Skin is warm.     Capillary Refill: Capillary refill takes less than 2 seconds.  Neurological:  General: No focal deficit present.     Mental Status: He is alert and oriented to person, place, and time.  Psychiatric:        Mood and Affect: Mood normal.        Labs on Admission: I have personally reviewed the patients's labs and imaging studies.  Assessment/Plan Principal Problem:   Acute exacerbation of CHF (congestive heart failure) (HCC)   # Neutropenic fever most likely secondary to community acquired pneumonia, POA, active # Chemotherapy-induced bicytopenia, POA, active -Subjective fever - Productive cough and shortness of breath - Atelectasis on chest x-ray  Plan: Obtain CT chest to further assess presence of infiltrates Patient's not had a fever while in the hospital but will closely monitor Broadened to vancomycin and cefepime due to recent neutropenia.  Patient will likely need to be discharged with prophylactic antibiotics if his counts do not recover FU Bcx  #Hx of oropharyngeal SCC- ctm. Currently not on chemotherapy  # Hypokalemia-replete with potassium  # Hyperlipidemia-continue Crestor   #Chronic pain-continue gabapentin  #HIV- Continue HIV medications  #Anxiety-continue bupropion  # Hypertension-continue  clonidine, losartan   Admission status: Inpatient Telemetry  Certification: The appropriate patient status for this patient is INPATIENT. Inpatient status is judged to be reasonable and necessary in order to provide the required intensity of service to ensure the patient's safety. The patient's presenting symptoms, physical exam findings, and initial radiographic and laboratory data in the context of their chronic comorbidities is felt to place them at high risk for further clinical deterioration. Furthermore, it is not anticipated that the patient will be medically stable for discharge from the hospital within 2 midnights of admission.   * I certify that at the point of admission it is my clinical judgment that the patient will require inpatient hospital care spanning beyond 2 midnights from the point of admission due to high intensity of service, high risk for further deterioration and high frequency of surveillance required.Alan Mulder MD [facility] If 7PM-7AM, please contact night-coverage [email or web address]  06/25/2022, 12:09 AM   A

## 2022-06-25 NOTE — Progress Notes (Signed)
Patient slid down off edge of bed while eating after ativan administration. Assisted patient back into bed and telemetry monitor reapplied. Patient is resting now with VS stable. No injury noted. No change from previous assessment. Sister called and notified.

## 2022-06-25 NOTE — Progress Notes (Signed)
Pharmacy Antibiotic Note  [name] is a 62 y.o. male admitted on 06/24/2022 with medical history significant of with history of HIV, Oropharyngeal malignancy status postchemotherapy who presents ED with cough and weakness.  Patient receiving chemotherapy however was put on hold due to neutropenia..  Pharmacy has been consulted for cefepime dosing.  Plan: Cefepime 2gm IV q8h Follow renal function, cultures and clinical course     Temp (24hrs), Avg:98.8 F (37.1 C), Min:98.7 F (37.1 C), Max:98.9 F (37.2 C)  Recent Labs  Lab 06/20/22 1241 06/21/22 1045 06/24/22 2054 06/24/22 2230  WBC 0.6* 0.6* 2.4*  --   CREATININE 0.95 1.00 0.95 0.92    Estimated Creatinine Clearance: 74.7 mL/min (by C-G formula based on SCr of 0.92 mg/dL).    Allergies  Allergen Reactions   Shellfish Allergy Anaphylaxis and Swelling    Swelling everywhere   Sulfa Antibiotics Anaphylaxis, Hives, Itching and Swelling    Swelling all over    Fish Allergy Swelling   Clindamycin Diarrhea    Antimicrobials this admission: 6/15 CTX x1 6/16 doxy  x1 6/16 cefepime >>   Dose adjustments this admission:   Microbiology results: 6/15 BCx:  6/15 UCx:     Thank you for allowing pharmacy to be a part of this patient's care.  Arley Phenix RPh 06/25/2022, 12:36 AM

## 2022-06-25 NOTE — Progress Notes (Signed)
Pt caught smoking near MD entrance beside cafeteria by [employer] operational officer. Pt had IV still intact and heart monitor on. Pt back to unit and advised about policy. MD notified.

## 2022-06-26 ENCOUNTER — Inpatient Hospital Stay: Payer: Medicare Other

## 2022-06-26 ENCOUNTER — Encounter (HOSPITAL_COMMUNITY): Payer: Self-pay | Admitting: Internal Medicine

## 2022-06-26 ENCOUNTER — Other Ambulatory Visit: Payer: Self-pay

## 2022-06-26 ENCOUNTER — Ambulatory Visit
Admission: RE | Admit: 2022-06-26 | Discharge: 2022-06-26 | Disposition: A | Discharge status: Home / Self Care | Payer: Medicare Other | Source: Ambulatory Visit | Attending: Radiation Oncology | Admitting: Radiation Oncology

## 2022-06-26 ENCOUNTER — Ambulatory Visit: Payer: Medicare Other

## 2022-06-26 DIAGNOSIS — B20 Human immunodeficiency virus [HIV] disease: Secondary | ICD-10-CM | POA: Diagnosis not present

## 2022-06-26 DIAGNOSIS — J189 Pneumonia, unspecified organism: Secondary | ICD-10-CM

## 2022-06-26 DIAGNOSIS — F17210 Nicotine dependence, cigarettes, uncomplicated: Secondary | ICD-10-CM

## 2022-06-26 LAB — RAD ONC ARIA SESSION SUMMARY
Course Elapsed Days: 49
Plan Fractions Treated to Date: 6
Plan Prescribed Dose Per Fraction: 2 Gy
Plan Total Fractions Prescribed: 13
Plan Total Prescribed Dose: 26 Gy
Reference Point Dosage Given to Date: 12 Gy
Reference Point Session Dosage Given: 2 Gy
Session Number: 28

## 2022-06-26 LAB — CULTURE, RESPIRATORY W GRAM STAIN

## 2022-06-26 LAB — URINE CULTURE

## 2022-06-26 LAB — STREP PNEUMONIAE URINARY ANTIGEN: Strep Pneumo Urinary Antigen: NEGATIVE

## 2022-06-26 LAB — CULTURE, BLOOD (ROUTINE X 2)
Special Requests: ADEQUATE
Special Requests: ADEQUATE

## 2022-06-26 LAB — MRSA NEXT GEN BY PCR, NASAL: MRSA by PCR Next Gen: NOT DETECTED

## 2022-06-26 MED ORDER — FLUCONAZOLE 100 MG PO TABS
200.0000 mg | ORAL_TABLET | Freq: Every day | ORAL | Status: DC
  Administered 2022-06-26 – 2022-06-30 (×5): 200 mg via ORAL
  Filled 2022-06-26 (×5): qty 2

## 2022-06-26 MED ORDER — METRONIDAZOLE 500 MG PO TABS
500.0000 mg | ORAL_TABLET | Freq: Two times a day (BID) | ORAL | Status: DC
  Administered 2022-06-26 – 2022-06-28 (×4): 500 mg via ORAL
  Filled 2022-06-26 (×4): qty 1

## 2022-06-26 MED ORDER — SODIUM CHLORIDE 0.9 % IV SOLN: INTRAVENOUS | Status: DC

## 2022-06-26 MED ORDER — SODIUM CHLORIDE 0.9 % IV SOLN
INTRAVENOUS | Status: DC
  Administered 2022-06-27: 75 mL/h via INTRAVENOUS

## 2022-06-26 NOTE — TOC Initial Note (Signed)
Transition of Care [facility]) - Initial/Assessment Note    Patient Details  Name: [name] MRN: [id number] Date of Birth: 1960/11/16  Transition of Care [facility]) CM/SW Contact:    Howell Rucks, RN Phone Number: 06/26/2022, 10:38 AM  Clinical Narrative:   Met with pt at bedside to introduce role of TOC/NCM and review for dc needs. Pt drowsy, NCM will speak with pt when pt more alert.                      Patient Goals and CMS Choice            Expected Discharge Plan and Services                                              Prior Living Arrangements/Services                       Activities of Daily Living Home Assistive Devices/Equipment: None ADL Screening (condition at time of admission) Patient's cognitive ability adequate to safely complete daily activities?: Yes Is the patient deaf or have difficulty hearing?: No Does the patient have difficulty seeing, even when wearing glasses/contacts?: No Does the patient have difficulty concentrating, remembering, or making decisions?: No Patient able to express need for assistance with ADLs?: Yes Does the patient have difficulty dressing or bathing?: No Independently performs ADLs?: Yes (appropriate for developmental age) Does the patient have difficulty walking or climbing stairs?: Yes Weakness of Legs: Both Weakness of Arms/Hands: None  Permission Sought/Granted                  Emotional Assessment              Admission diagnosis:  Shortness of breath [R06.02] Acute exacerbation of CHF (congestive heart failure) (HCC) [I50.9] Pneumonia of lower lobe due to infectious organism, unspecified laterality [J18.9] Patient Active Problem List   Diagnosis Date Noted   Pneumonia of lower lobe due to infectious organism 06/25/2022   Acute exacerbation of CHF (congestive heart failure) (HCC) 06/24/2022   Port-A-Cath in place 05/25/2022   Cancer of tonsillar fossa (HCC) 04/25/2022   SCC  (squamous cell carcinoma) 04/24/2022   Normocytic anemia 04/24/2022   Chronic back pain 02/24/2020   Ileus (HCC) 03/24/2019   MDD (major depressive disorder), recurrent episode (HCC) 04/20/2018   Chronic right-sided low back pain 04/16/2018   Weakness of right lower extremity 04/16/2018   Healthcare maintenance 02/04/2018   Blood loss anemia 01/28/2018   Rectal bleeding 01/28/2018   Alcohol dependence with alcohol-induced mood disorder (HCC)    Major depressive disorder, recurrent severe without psychotic features (HCC) 08/01/2017   Dysphagia 07/08/2017   Stenosis of cervical spine with myelopathy (HCC) 07/06/2017   Left-sided weakness 06/27/2017   Tricompartment osteoarthritis of right knee 03/27/2017   Alcohol abuse with alcohol-induced mood disorder (HCC) 09/25/2016   Polysubstance dependence (HCC) 09/25/2016   Substance use disorder 09/19/2016   MDD (major depressive disorder), recurrent severe, without psychosis (HCC) 09/18/2016   Cocaine abuse (HCC) 07/27/2015   Suicidal ideation 07/27/2015   Cocaine dependence with intoxication with complication (HCC) 07/23/2015   Feeling like committing suicide 07/23/2015   Metabolic acidosis 07/23/2015   Tobacco abuse 07/23/2015   Substance induced mood disorder (HCC) 07/19/2015   Bipolar 1 disorder, depressed (HCC) 07/18/2015   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  HIV disease (HCC) 11/09/2008   PCP:  Pcp, No Pharmacy:   [facility][id number] [place], [place] - [phone] AT [facility] OF [facility] & [address] [place] [address] Phone: [phone] Fax: [phone]  [facility] #[id number] [facility], [place] - [address] AT [facility] OF [facility] [address] [place] [address] Phone: [phone] Fax: [phone]  [facility] #[id number] [place], [place] - [address] AT [facility] OF [address] [place]  [address] Phone: [phone] Fax: [phone]  [facility] [id number] - [place], [place] - [address] [facility] [address] [place] [address] Phone: [phone] Fax: [phone]  [facility] [address] [address] Phone: [phone] Fax: [phone]     Social Determinants of Health (SDOH) Social History: SDOH Screenings   Food Insecurity: No Food Insecurity (06/25/2022)  Housing: Low Risk  (06/25/2022)  Recent Concern: Housing - Medium Risk (06/07/2022)  Transportation Needs: No Transportation Needs (06/25/2022)  Utilities: Not At Risk (06/25/2022)  Alcohol Screen: Medium Risk (04/21/2018)  Depression (PHQ2-9): Low Risk  (05/11/2022)  Tobacco Use: High Risk (06/25/2022)   SDOH Interventions:     Readmission Risk Interventions     No data to display

## 2022-06-26 NOTE — Consult Note (Signed)
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 Date: 06/06/2022 CLINICAL DATA:  Pain after injury EXAM: DG HIP (WITH OR WITHOUT PELVIS) 3V RIGHT COMPARISON:  X-ray 05/23/2021 FINDINGS: Once again advanced degenerative changes of the right hip with sclerosis, joint space loss and subchondral cyst formation. Slight roundness of the femoral head as well. Please correlate for any underlying changes of AVN. Otherwise no fracture or dislocation. Preserved joint spaces and bone mineralization. Hypertrophic changes along the left lesser trochanter. There are some well rounded densities in the pelvis which are indeterminate although possibly vascular.  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 Progressive from the prior examination. There is also some loss of roundness of the femoral head. Please correlate for any developing AVN. Electronically Signed   By: Karen Kays M.D.   On: 06/06/2022 12:19     I have personally spent 100 minutes involved in face-to-face and non-face-to-face activities for this patient on the day of the visit. Professional time spent includes the following activities: Preparing to see the patient (review of tests), Obtaining and/or reviewing separately obtained history (admission/discharge record), Performing a medically appropriate examination and/or evaluation , Ordering medications/tests/procedures, referring and communicating with other health care professionals, Documenting clinical information in the EMR, Independently interpreting results (not separately reported), Communicating results to the patient/family/caregiver, Counseling and educating the patient/family/caregiver and Care coordination (not separately reported).  Electronically signed by:   Plan d/w requesting provider as well as ID pharm D  Note: This document was prepared using dragon voice recognition software and may include unintentional dictation errors.   Odette Fraction, MD Infectious Disease Physician [facility] Pager: [phone]

## 2022-06-26 NOTE — Progress Notes (Signed)
   06/25/22 1346  What Happened  Was fall witnessed? Yes  Who witnessed fall? RN  Patients activity before fall to/from bed, chair, or stretcher  Point of contact buttocks  Was patient injured? No  Provider Notification  Provider Name/Title Jerolyn Center, RN  Date Provider Notified 06/25/22  Time Provider Notified 1350  Method of Notification Page  Notification Reason Fall  Provider response Evaluate remotely  Date of Provider Response 06/25/22  Time of Provider Response 1356  Follow Up  Family notified No - patient refusal  Additional tests No  Progress note created (see row info) Yes  Adult Fall Risk Assessment  Risk Factor Category (scoring not indicated) High fall risk per protocol (document High fall risk)  Patient Fall Risk Level High fall risk  Adult Fall Risk Interventions  Required Bundle Interventions *See Row Information* High fall risk - low, moderate, and high requirements implemented  Additional Interventions Use of appropriate toileting equipment (bedpan, BSC, etc.)  Fall intervention(s) refused/Patient educated regarding refusal Nonskid socks;Bed alarm;Yellow bracelet;Open door if unsupervised  Screening for Fall Injury Risk (To be completed on HIGH fall risk patients) - Assessing Need for Floor Mats  Risk For Fall Injury- Criteria for Floor Mats Admitted as a result of a fall  Will Implement Floor Mats Yes  Vitals  BP (!) 85/61  MAP (mmHg) 68  BP Location Right Arm  BP Method Automatic  Patient Position (if appropriate) Lying  Pulse Rate 75  Pulse Rate Source Monitor  Resp 15  Oxygen Therapy  SpO2 91 %  O2 Device Room Air  Pain Assessment  Pain Scale 0-10  Pain Score 0  Neurological  Neuro (WDL) WDL  Level of Consciousness Alert  Musculoskeletal  Musculoskeletal (WDL) X  Assistive Device Front wheel walker  Generalized Weakness Yes  Weight Bearing Restrictions No  Integumentary  Integumentary (WDL) X  Skin Color Appropriate for ethnicity  Skin  Condition Dry  Skin Integrity Other (Comment) (burn on R side of neck from radiation)  Skin Turgor Non-tenting

## 2022-06-26 NOTE — Progress Notes (Addendum)
Patient transferred for radiation

## 2022-06-26 NOTE — Progress Notes (Signed)
PHARMACY NOTE -  Cefepime  Pharmacy has been assisting with dosing of cefepime for febrile neutropenia. Dosage remains stable at 2g IV q8 hr and further renal adjustments per institutional Pharmacy antibiotic protocol  Pharmacy will sign off, following peripherally for culture results, dose adjustments, and length of therapy. Please reconsult if a change in clinical status warrants re-evaluation of dosage.  Bernadene Person, PharmD, BCPS [phone] 06/26/2022, 12:45 PM

## 2022-06-26 NOTE — Progress Notes (Signed)
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 Chest x-ray shows left basilar atelectasis.  Chest CT - Multifocal peribronchovascular ground-glass opacities throughout the RIGHT upper lobe, RIGHT middle lobe and bilateral lower lobes. This is most confluent at the lung bases. Findings are favored to reflect multifocal infection. Recommend follow-up CT in 3 months to assess for resolution given the presence of an 8 mm atypical cyst in the RIGHT upper lobe.Endobronchial masslike area within the LEFT mainstem bronchus measuring up to 19 mm. This area was not FDG avid on recent PET CT. This is favored to subtly have been present on prior CT in 2009 with minimal increase in size since then suggesting underlying benign etiology. This may reflect a benign etiology such as a leiomyoma or polyp. Further evaluation with dedicated bronchoscopy and tissue sampling could be considered given moderate narrowing of LEFT mainstem bronchus due to this mass.  Assessment & Plan:  1 multifocal pneumonia in the setting of HIV/Status post chemotherapy for squamous cell ca of oropharynx-CT chest shows entire right along with ground glass capacities and the left lower lobe opacities. Patient was initially started on vancomycin and [place]. Vancomycin was stopped once mrsa PCR screen came back negative. id was consulted and they recommended to add Flagyl to cover aspiration possibility.   2 history of cancer of the tonsillar fossa/squamous cell ca of the oropharynx followed by oncology. Currently not in chemotherapy due to neutropenia. Last chemo May 2024.  3 HIV on symtusa, last CD4  count 735 from February 2023.  ID consulted.  4 HTN on cozaar blood pressure remains soft 112/81 will DC Cozaar and Catapres.  Follow-up closely to restart these medications as needed  5 hyperlipidemia on Crestor continue 6 anxiety on Wellbutrin continue  7 etoh abuse-on CIWA protocol  8 Thrush on diflucan/nystatin mouth wash  Estimated body mass index is 18.69 kg/m as calculated from the following:   Height as of this encounter: 6' (1.829 m).   Weight as of this encounter: 62.5 kg.  DVT prophylaxis: lovenox Code Status: full Family Communication: none Disposition Plan:  Status is: Inpatient Remains inpatient appropriate because: Multifocal pneumonia in immunosuppressed patient    Consultants:  id  Procedures: none Antimicrobials: Anti-infectives (From admission, onward)    Start     Dose/Rate Route Frequency Ordered Stop   06/26/22 1515  fluconazole (DIFLUCAN) tablet 200 mg        200 mg Oral Daily 06/26/22 1418 07/03/22 0959   06/25/22 0800  Darunavir-Cobicistat-Emtricitabine-Tenofovir Alafenamide (SYMTUZA) 800-150-200-10 MG TABS 1 tablet        1 tablet Oral Daily with breakfast 06/24/22 2211     06/25/22 0200  ceFEPIme (MAXIPIME) 2 g in sodium chloride 0.9 % 100 mL IVPB        2 g 200 mL/hr over 30 Minutes Intravenous Every 8 hours 06/25/22 0033     06/24/22 2200  cefTRIAXone (ROCEPHIN) 1 g in sodium chloride 0.9 % 100 mL IVPB        1 g 200 mL/hr over 30 Minutes Intravenous  Once 06/24/22 2155 06/24/22 2306   06/24/22 2200  doxycycline (VIBRAMYCIN) 100 mg in sodium chloride 0.9 % 250 mL IVPB  100 mg 125 mL/hr over 120 Minutes Intravenous  Once 06/24/22 2155 06/25/22 0701        Subjective: Very drowsy Got ativan per ciwa  Uds opiates  Objective: Vitals:   06/25/22 2026 06/26/22 0506 06/26/22 1140 06/26/22 1341  BP: (!) 157/78 (!) 124/93  112/81  Pulse: 67 82  80  Resp: 19 18  16   Temp: 98.1 F (36.7 C) (!) 97.5 F (36.4 C)  97.8 F (36.6 C)   TempSrc: Oral Oral  Oral  SpO2: 100% 97%  100%  Weight:   62.5 kg   Height:   6' (1.829 m)     Intake/Output Summary (Last 24 hours) at 06/26/2022 1531 Last data filed at 06/26/2022 0939 Gross per 24 hour  Intake 928 ml  Output --  Net 928 ml   Filed Weights   06/26/22 1140  Weight: 62.5 kg    Examination: Right neck swelling possible lymph nodes? General exam: Appears drowsy Respiratory system: Rhonchi right more than left to auscultation. Respiratory effort normal. Cardiovascular system: S1 & S2 heard, RRR. No JVD, murmurs, rubs, gallops or clicks. No pedal edema. Gastrointestinal system: Abdomen is nondistended, soft and nontender. No organomegaly or masses felt. Normal bowel sounds heard. Central nervous system: Alert and oriented.  Extremities: 2 plus edema    Data Reviewed: I have personally reviewed following labs and imaging studies  CBC: Recent Labs  Lab 06/20/22 1241 06/21/22 1045 06/24/22 2054 06/24/22 2348 06/25/22 0129  WBC 0.6* 0.6* 2.4* 3.4* 3.4*  NEUTROABS 0.3*  --  1.6*  --   --   HGB 8.5* 8.0* 8.7* 9.1* 8.3*  HCT 26.1* 25.3* 27.6* 29.6* 26.1*  MCV 83.9 85.8 84.9 87.1 86.1  PLT 105* 122* 394 434* 393   Basic Metabolic Panel: Recent Labs  Lab 06/20/22 1241 06/21/22 1045 06/24/22 2054 06/24/22 2230 06/25/22 0129  NA 133* 132* 133*  --  134*  K 4.0 3.6 3.3*  --  3.7  CL 99 98 97*  --  98  CO2 29 26 27   --  28  GLUCOSE 130* 91 118*  --  120*  BUN 14 13 22   --  16  CREATININE 0.95 1.00 0.95 0.92 0.95  CALCIUM 8.4* 8.1* 8.1*  --  8.2*  MG 1.9  --   --   --   --    GFR: Estimated Creatinine Clearance: 72.2 mL/min (by C-G formula based on SCr of 0.95 mg/dL). Liver Function Tests: No results for input(s): "AST", "ALT", "ALKPHOS", "BILITOT", "PROT", "ALBUMIN" in the last 168 hours. No results for input(s): "LIPASE", "AMYLASE" in the last 168 hours. No results for input(s): "AMMONIA" in the last 168 hours. Coagulation Profile: No results for  input(s): "INR", "PROTIME" in the last 168 hours. Cardiac Enzymes: No results for input(s): "CKTOTAL", "CKMB", "CKMBINDEX", "TROPONINI" in the last 168 hours. BNP (last 3 results) No results for input(s): "PROBNP" in the last 8760 hours. HbA1C: No results for input(s): "HGBA1C" in the last 72 hours. CBG: Recent Labs  Lab 06/21/22 1011  GLUCAP 73   Lipid Profile: No results for input(s): "CHOL", "HDL", "LDLCALC", "TRIG", "CHOLHDL", "LDLDIRECT" in the last 72 hours. Thyroid Function Tests: No results for input(s): "TSH", "T4TOTAL", "FREET4", "T3FREE", "THYROIDAB" in the last 72 hours. Anemia Panel: No results for input(s): "VITAMINB12", "FOLATE", "FERRITIN", "TIBC", "IRON", "RETICCTPCT" in the last 72 hours. Sepsis Labs: No results for input(s): "PROCALCITON", "LATICACIDVEN" in the last 168 hours.  Recent Results (from the past 240 hour(s))  Urine Culture     Status: Abnormal   Collection Time: 06/16/22  3:32 PM   Specimen: Urine, Clean Catch  Result Value Ref Range Status   Specimen Description   Final    URINE, CLEAN CATCH Performed at [facility], [address], [place], [place] [address]    Special Requests   Final    NONE Performed at [facility], [address] [place], [place] [address]    Culture (A)  Final    <10,000 COLONIES/mL INSIGNIFICANT GROWTH Performed at [facility], [address] [place], [place] [address]    Report Status 06/17/2022 FINAL  Final  Culture, blood (Routine X 2) w Reflex to ID Panel     Status: None (Preliminary result)   Collection Time: 06/25/22  1:29 AM   Specimen: BLOOD  Result Value Ref Range Status   Specimen Description   Final    BLOOD BLOOD LEFT ARM Performed at [facility], [address] [place], [place] [address]    Special Requests   Final    BOTTLES DRAWN AEROBIC AND ANAEROBIC Blood Culture adequate volume Performed at [facility], [address], [place], [place] [address]    Culture   Final    NO GROWTH 1 DAY Performed at [facility], [address], [place], [place] [address]    Report Status PENDING  Incomplete  Culture, blood (Routine X 2) w Reflex to ID Panel     Status: None (Preliminary result)   Collection Time: 06/25/22  1:29 AM   Specimen: BLOOD  Result Value Ref Range Status   Specimen Description   Final    BLOOD BLOOD LEFT HAND Performed at [facility], [address] [place], [place] [address]    Special Requests   Final    BOTTLES DRAWN AEROBIC ONLY Blood Culture adequate volume Performed at [facility], [address] [place], [place] [address]    Culture   Final    NO GROWTH 1 DAY Performed at [facility], [address] [place], [place] [address]    Report Status PENDING  Incomplete  Urine Culture (for pregnant, neutropenic or urologic patients or patients with an indwelling urinary catheter)     Status: Abnormal   Collection Time: 06/25/22  2:11 AM   Specimen: Urine, Clean Catch  Result Value Ref Range Status   Specimen Description   Final    URINE, CLEAN CATCH Performed at [facility], [address], [place], [place] [address]    Special Requests   Final    NONE Performed at [facility], [address] [place], [place] [address]    Culture MULTIPLE SPECIES PRESENT, SUGGEST RECOLLECTION (A)  Final   Report Status 06/26/2022 FINAL  Final  Culture, Respiratory w Gram Stain     Status: None (Preliminary result)   Collection Time: 06/26/22 11:55 AM   Specimen: Sputum; Respiratory  Result Value Ref Range Status   Specimen Description   Final    SPU Performed at [facility], [address], [place], [place] [address]    Special Requests   Final    Immunocompromised Performed at [facility], [address] [place], [place] [address]     Gram Stain   Final    ABUNDANT WBC PRESENT, PREDOMINANTLY PMN FEW SQUAMOUS EPITHELIAL CELLS PRESENT RARE GRAM POSITIVE  RODS MODERATE YEAST Performed at [facility], [address], [place], [place] [address]    Culture PENDING  Incomplete   Report Status PENDING  Incomplete  MRSA Next Gen by PCR, Nasal     Status: None   Collection Time: 06/26/22 11:55 AM   Specimen: Nasal Mucosa; Nasal Swab  Result Value Ref Range Status   MRSA by PCR Next Gen NOT DETECTED NOT DETECTED Final    Comment: (NOTE) The GeneXpert MRSA Assay (FDA approved for NASAL specimens only), is one component of a comprehensive MRSA colonization surveillance program. It is not intended to diagnose MRSA infection nor to guide or monitor treatment for MRSA infections. Test performance is not FDA approved in patients less than 18 years old. Performed at [facility], [address] [place], [place] [address]          Radiology Studies: CT CHEST WO CONTRAST  Result Date: 06/25/2022 CLINICAL DATA:  Chest pain, nonspecific sob * Tracking Code: BO * history of tonsillar cancer EXAM: CT CHEST WITHOUT CONTRAST TECHNIQUE: Multidetector CT imaging of the chest was performed following the standard protocol without IV contrast. RADIATION DOSE REDUCTION: This exam was performed according to the departmental dose-optimization program which includes automated exposure control, adjustment of the mA and/or kV according to patient size and/or use of iterative reconstruction technique. COMPARISON:  April 27, 2022 FINDINGS: Cardiovascular: Heart is normal in size. Trace pericardial fluid. RIGHT chest port with tip terminating at the superior cavoatrial junction. Predominately LEFT-sided coronary artery atherosclerotic calcifications. Scattered atherosclerotic calcifications of the nonaneurysmal thoracic aorta. Mediastinum/Nodes: Visualized thyroid is unremarkable. No axillary adenopathy. Mildly enlarged  precarinal mediastinal lymph node measures 11 mm in the short axis (series 2, image 74). Lungs/Pleura: No pleural effusion or pneumothorax. Moderate centrilobular emphysema. Peribronchovascular ground-glass opacities throughout the RIGHT upper lobe, RIGHT middle lobe and bilateral lower lobes. This is most confluent at the lung bases. Mild bronchial wall thickening. Endobronchial masslike area within the LEFT mainstem bronchus measuring approximately 16 x 19 mm (series 7, image 57; series 2, image 81)). This was not FDG avid on recent PET CT. This is favored to subtly have been present on prior CT in 2009 with minimal increase in size since then suggesting underlying benign etiology. It moderately narrows the bronchus. Atypical RIGHT apical cyst with a thick rind measuring 8 by 5 mm (series 2, image 54). LEFT upper lobe pulmonary nodule measures 5 mm (series 2, image 62). Upper Abdomen: No acute abnormality. Musculoskeletal: No acute osseous abnormality. IMPRESSION: 1. Multifocal peribronchovascular ground-glass opacities throughout the RIGHT upper lobe, RIGHT middle lobe and bilateral lower lobes. This is most confluent at the lung bases. Findings are favored to reflect multifocal infection. Recommend follow-up CT in 3 months to assess for resolution given the presence of an 8 mm atypical cyst in the RIGHT upper lobe. 2. Endobronchial masslike area within the LEFT mainstem bronchus measuring up to 19 mm. This area was not FDG avid on recent PET CT. This is favored to subtly have been present on prior CT in 2009 with minimal increase in size since then suggesting underlying benign etiology. This may reflect a benign etiology such as a leiomyoma or polyp. Further evaluation with dedicated bronchoscopy and tissue sampling could be considered given moderate narrowing of LEFT mainstem bronchus due to this mass. Aortic Atherosclerosis (ICD10-I70.0) and Emphysema (ICD10-J43.9). Electronically Signed   By: Meda Klinefelter M.D.   On: 06/25/2022 15:23   DG Chest 2 View  Result Date: 06/24/2022 CLINICAL DATA:  Shortness of breath two days EXAM: CHEST - 2 VIEW COMPARISON:  04/22/2022 FINDINGS: Cardiac shadow is within normal limits. Right chest wall port is again seen. Calcified loose body is noted about the right shoulder joint stable from the prior study. New left basilar atelectasis is seen without sizable effusion. IMPRESSION: Left basilar atelectasis. Electronically Signed   By: Alcide Clever M.D.   On: 06/24/2022 21:09        Scheduled Meds:  buPROPion  300 mg Oral Daily   Chlorhexidine Gluconate Cloth  6 each Topical Daily   cloNIDine  0.1 mg Oral BID   Darunavir-Cobicistat-Emtricitabine-Tenofovir Alafenamide  1 tablet Oral Q breakfast   enoxaparin (LOVENOX) injection  40 mg Subcutaneous Q24H   fluconazole  200 mg Oral Daily   folic acid  1 mg Oral Daily   gabapentin  800 mg Oral TID   losartan  25 mg Oral Daily   magic mouthwash w/lidocaine  5 mL Oral QID   multivitamin with minerals  1 tablet Oral Daily   rosuvastatin  5 mg Oral Daily   thiamine  100 mg Oral Daily   Or   thiamine  100 mg Intravenous Daily   Continuous Infusions:  sodium chloride 75 mL/hr at 06/26/22 1420   ceFEPime (MAXIPIME) IV 2 g (06/26/22 0909)     LOS: 2 days    Time spent: 39 min  Alwyn Ren, MD  06/26/2022, 3:31 PM

## 2022-06-26 NOTE — Progress Notes (Addendum)
Patient returned from radiation.

## 2022-06-27 ENCOUNTER — Other Ambulatory Visit: Payer: Self-pay

## 2022-06-27 ENCOUNTER — Encounter: Payer: Medicare Other | Admitting: Dietician

## 2022-06-27 ENCOUNTER — Ambulatory Visit: Payer: Medicare Other

## 2022-06-27 ENCOUNTER — Ambulatory Visit
Admission: RE | Admit: 2022-06-27 | Discharge: 2022-06-27 | Disposition: A | Discharge status: Home / Self Care | Payer: Medicare Other | Source: Ambulatory Visit | Attending: Radiation Oncology | Admitting: Radiation Oncology

## 2022-06-27 DIAGNOSIS — R0602 Shortness of breath: Secondary | ICD-10-CM | POA: Diagnosis not present

## 2022-06-27 LAB — RAD ONC ARIA SESSION SUMMARY
Course Elapsed Days: 50
Plan Fractions Treated to Date: 7
Plan Prescribed Dose Per Fraction: 2 Gy
Plan Total Fractions Prescribed: 13
Plan Total Prescribed Dose: 26 Gy
Reference Point Dosage Given to Date: 14 Gy
Reference Point Session Dosage Given: 2 Gy
Session Number: 29

## 2022-06-27 LAB — CBC
HCT: 26.4 % — ABNORMAL LOW (ref 39.0–52.0)
Hemoglobin: 8.3 g/dL — ABNORMAL LOW (ref 13.0–17.0)
MCH: 26.9 pg (ref 26.0–34.0)
MCHC: 31.4 g/dL (ref 30.0–36.0)
MCV: 85.7 fL (ref 80.0–100.0)
Platelets: 527 10*3/uL — ABNORMAL HIGH (ref 150–400)
RBC: 3.08 MIL/uL — ABNORMAL LOW (ref 4.22–5.81)
RDW: 18.8 % — ABNORMAL HIGH (ref 11.5–15.5)
WBC: 6.5 10*3/uL (ref 4.0–10.5)
nRBC: 0 % (ref 0.0–0.2)

## 2022-06-27 LAB — CULTURE, BLOOD (ROUTINE X 2): Culture: NO GROWTH

## 2022-06-27 LAB — COMPREHENSIVE METABOLIC PANEL
ALT: 21 U/L (ref 0–44)
AST: 18 U/L (ref 15–41)
Albumin: 2.1 g/dL — ABNORMAL LOW (ref 3.5–5.0)
Alkaline Phosphatase: 70 U/L (ref 38–126)
Anion gap: 6 (ref 5–15)
BUN: 15 mg/dL (ref 8–23)
CO2: 28 mmol/L (ref 22–32)
Calcium: 8 mg/dL — ABNORMAL LOW (ref 8.9–10.3)
Chloride: 101 mmol/L (ref 98–111)
Creatinine, Ser: 0.76 mg/dL (ref 0.61–1.24)
GFR, Estimated: >60 mL/min (ref >60)
Glucose, Bld: 144 mg/dL — ABNORMAL HIGH (ref 70–99)
Potassium: 3.5 mmol/L (ref 3.5–5.1)
Sodium: 135 mmol/L (ref 135–145)
Total Bilirubin: 0.4 mg/dL (ref 0.3–1.2)
Total Protein: 5.7 g/dL — ABNORMAL LOW (ref 6.5–8.1)

## 2022-06-27 LAB — CULTURE, RESPIRATORY W GRAM STAIN

## 2022-06-27 NOTE — Progress Notes (Signed)
Mobility Specialist - Progress Note   06/27/22 1210  Mobility  Activity Ambulated with assistance in hallway  Level of Assistance Standby assist, set-up cues, supervision of patient - no hands on  Assistive Device Other (Comment) (rollator)  Distance Ambulated (ft) 300 ft  Range of Motion/Exercises Active  Activity Response Tolerated well  Mobility Referral Yes  $Mobility charge 1 Mobility  Mobility Specialist Start Time (ACUTE ONLY) 1140  Mobility Specialist Stop Time (ACUTE ONLY) 1208  Mobility Specialist Time Calculation (min) (ACUTE ONLY) 28 min   Pt was found standing beside bed wanting to ambulate. No complaints and returned to use bathroom. NT and RN notified.  Billey Chang Mobility Specialist

## 2022-06-27 NOTE — TOC Progression Note (Signed)
Transition of Care [facility]) - Progression Note    Patient Details  Name: [name] MRN: [id number] Date of Birth: 1960/03/09  [facility] CM/SW Contact  Howell Rucks, RN Phone Number: 06/27/2022, 10:06 AM  Clinical Narrative:  Met with pt at bedside to introduce role of TOC/NCM and review for dc needs. Pt reports he has a PCP and pharmacy in place, pt reports he has a cane and rollator  he uses for functional mobility, pt reports he is receiving no home services at this time, pt reports he will need assistance with transport at discharge. TOC consult received for SA, pt declines resources. TOC will continue to follow.      Expected Discharge Plan: Home/Self Care Barriers to Discharge: Continued Medical Work up  Expected Discharge Plan and Services   Discharge Planning Services: CM Consult                                           Social Determinants of Health (SDOH) Interventions SDOH Screenings   Food Insecurity: No Food Insecurity (06/25/2022)  Housing: Low Risk  (06/25/2022)  Recent Concern: Housing - Medium Risk (06/07/2022)  Transportation Needs: No Transportation Needs (06/25/2022)  Utilities: Not At Risk (06/25/2022)  Alcohol Screen: Medium Risk (04/21/2018)  Depression (PHQ2-9): Low Risk  (05/11/2022)  Tobacco Use: High Risk (06/26/2022)    Readmission Risk Interventions    06/27/2022   10:03 AM  Readmission Risk Prevention Plan  Transportation Screening Complete  Medication Review (RN Care Manager) Complete  PCP or Specialist appointment within 3-5 days of discharge Complete  HRI or Home Care Consult Complete  SW Recovery Care/Counseling Consult Complete  Palliative Care Screening Not Applicable  Skilled Nursing Facility Not Applicable

## 2022-06-27 NOTE — Progress Notes (Signed)
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 Chest x-ray shows left basilar atelectasis.   Chest CT - Multifocal peribronchovascular ground-glass opacities throughout the RIGHT upper lobe, RIGHT middle lobe and bilateral lower lobes. This is most confluent at the lung bases. Findings are favored to reflect multifocal infection. Recommend follow-up CT in 3 months to assess for resolution given the presence of an 8 mm atypical cyst in the RIGHT upper lobe.Endobronchial masslike area within the LEFT mainstem bronchus measuring up to 19 mm. This area was not FDG avid on recent PET CT. This is favored to subtly have been present on prior CT in 2009 with minimal increase in size since then suggesting underlying benign etiology. This may reflect a benign etiology such as a leiomyoma or polyp. Further evaluation with dedicated bronchoscopy and tissue sampling could be considered given moderate narrowing of LEFT mainstem bronchus due to this mass.  Assessment & Plan:  1 multifocal pneumonia in the setting of HIV/Status post chemotherapy for squamous cell ca of oropharynx-CT chest shows entire right along with ground glass capacities and the left lower lobe opacities. Patient was initially started on vancomycin and cefepime.  Vancomycin was stopped once mrsa PCR screen came back negative. id was consulted and they recommended to add Flagyl to cover aspiration possibility. ID recommended cefepime Flagyl and Diflucan.   2 history of cancer of the tonsillar fossa/squamous cell ca of the oropharynx followed by oncology. Currently not in chemotherapy due to neutropenia.  Last chemo May 2024.  3 HIV on symtusa, last CD4 count 735 from February 2023.  ID following.  4 HTN on cozaar blood pressure remains soft 112/81 will DC Cozaar and Catapres.  Follow-up closely to restart these medications as needed  5 hyperlipidemia on Crestor continue  6 anxiety on Wellbutrin continue  7 etoh abuse-on CIWA protocol  8 Thrush on diflucan/nystatin mouth wash  Estimated body mass index is 18.69 kg/m as calculated from the following:   Height as of this encounter: 6' (1.829 m).   Weight as of this encounter: 62.5 kg.  DVT prophylaxis: lovenox Code Status: full Family Communication: none Disposition Plan:  Status is: Inpatient Remains inpatient appropriate because: Multifocal pneumonia in immunosuppressed patient    Consultants:  id  Procedures: none Antimicrobials: Anti-infectives (From admission, onward)    Start     Dose/Rate Route Frequency Ordered Stop   06/26/22 2200  metroNIDAZOLE (FLAGYL) tablet 500 mg        500 mg Oral Every 12 hours 06/26/22 1702     06/26/22 1515  fluconazole (DIFLUCAN) tablet 200 mg        200 mg Oral Daily 06/26/22 1418 07/03/22 0959   06/25/22 0800  Darunavir-Cobicistat-Emtricitabine-Tenofovir Alafenamide (SYMTUZA) 800-150-200-10 MG TABS 1 tablet        1 tablet Oral Daily with breakfast 06/24/22 2211     06/25/22 0200  ceFEPIme (MAXIPIME) 2 g in sodium chloride 0.9 % 100 mL IVPB        2 g 200 mL/hr over 30 Minutes Intravenous Every 8 hours 06/25/22 0033     06/24/22 2200  cefTRIAXone (ROCEPHIN) 1 g in sodium chloride 0.9 % 100 mL IVPB  1 g 200 mL/hr over 30 Minutes Intravenous  Once 06/24/22 2155 06/24/22 2306   06/24/22 2200  doxycycline (VIBRAMYCIN) 100 mg in sodium chloride 0.9 % 250 mL IVPB        100 mg 125 mL/hr over 120 Minutes Intravenous  Once 06/24/22 2155 06/25/22 0701        Subjective: Awake alert Moving around with walker Asking for regular diet does not want a cardiac  diet  Objective: Vitals:   06/25/22 2026 06/26/22 0506 06/26/22 1140 06/26/22 1341  BP: (!) 157/78 (!) 124/93  112/81  Pulse: 67 82  80  Resp: 19 18  16   Temp: 98.1 F (36.7 C) (!) 97.5 F (36.4 C)  97.8 F (36.6 C)  TempSrc: Oral Oral  Oral  SpO2: 100% 97%  100%  Weight:   62.5 kg   Height:   6' (1.829 m)     Intake/Output Summary (Last 24 hours) at 06/27/2022 1111 Last data filed at 06/27/2022 0731 Gross per 24 hour  Intake 1659.56 ml  Output 700 ml  Net 959.56 ml   Filed Weights   06/26/22 1140  Weight: 62.5 kg    Examination: Left neck lipoma General exam: Appears drowsy Respiratory system: Rhonchi right more than left to auscultation. Respiratory effort normal. Cardiovascular system: S1 & S2 heard, RRR. No JVD, murmurs, rubs, gallops or clicks. No pedal edema. Gastrointestinal system: Abdomen is nondistended, soft and nontender. No organomegaly or masses felt. Normal bowel sounds heard. Central nervous system: Alert and oriented.  Extremities: 2 plus edema    Data Reviewed: I have personally reviewed following labs and imaging studies  CBC: Recent Labs  Lab 06/20/22 1241 06/21/22 1045 06/24/22 2054 06/24/22 2348 06/25/22 0129 06/27/22 0408  WBC 0.6* 0.6* 2.4* 3.4* 3.4* 6.5  NEUTROABS 0.3*  --  1.6*  --   --   --   HGB 8.5* 8.0* 8.7* 9.1* 8.3* 8.3*  HCT 26.1* 25.3* 27.6* 29.6* 26.1* 26.4*  MCV 83.9 85.8 84.9 87.1 86.1 85.7  PLT 105* 122* 394 434* 393 527*   Basic Metabolic Panel: Recent Labs  Lab 06/20/22 1241 06/21/22 1045 06/24/22 2054 06/24/22 2230 06/25/22 0129 06/27/22 0408  NA 133* 132* 133*  --  134* 135  K 4.0 3.6 3.3*  --  3.7 3.5  CL 99 98 97*  --  98 101  CO2 29 26 27   --  28 28  GLUCOSE 130* 91 118*  --  120* 144*  BUN 14 13 22   --  16 15  CREATININE 0.95 1.00 0.95 0.92 0.95 0.76  CALCIUM 8.4* 8.1* 8.1*  --  8.2* 8.0*  MG 1.9  --   --   --   --   --    GFR: Estimated Creatinine Clearance: 85.7 mL/min (by C-G formula based on  SCr of 0.76 mg/dL). Liver Function Tests: Recent Labs  Lab 06/27/22 0408  AST 18  ALT 21  ALKPHOS 70  BILITOT 0.4  PROT 5.7*  ALBUMIN 2.1*   No results for input(s): "LIPASE", "AMYLASE" in the last 168 hours. No results for input(s): "AMMONIA" in the last 168 hours. Coagulation Profile: No results for input(s): "INR", "PROTIME" in the last 168 hours. Cardiac Enzymes: No results for input(s): "CKTOTAL", "CKMB", "CKMBINDEX", "TROPONINI" in the last 168 hours. BNP (last 3 results) No results for input(s): "PROBNP" in the last 8760 hours. HbA1C: No results for input(s): "HGBA1C" in the last 72 hours. CBG: Recent Labs  Lab 06/21/22 1011  GLUCAP 73   Lipid Profile: No results for input(s): "CHOL", "HDL", "LDLCALC", "TRIG", "CHOLHDL", "LDLDIRECT" in the last 72 hours. Thyroid Function Tests: No results for input(s): "TSH", "T4TOTAL", "FREET4", "T3FREE", "THYROIDAB" in the last 72 hours. Anemia Panel: No results for input(s): "VITAMINB12", "FOLATE", "FERRITIN", "TIBC", "IRON", "RETICCTPCT" in the last 72 hours. Sepsis Labs: No results for input(s): "PROCALCITON", "LATICACIDVEN" in the last 168 hours.  Recent Results (from the past 240 hour(s))  Culture, blood (Routine X 2) w Reflex to ID Panel     Status: None (Preliminary result)   Collection Time: 06/25/22  1:29 AM   Specimen: BLOOD  Result Value Ref Range Status   Specimen Description   Final    BLOOD BLOOD LEFT ARM Performed at [facility], [address], [place], [place] [address]    Special Requests   Final    BOTTLES DRAWN AEROBIC AND ANAEROBIC Blood Culture adequate volume Performed at [facility], [address] [place], [place] [address]    Culture   Final    NO GROWTH 2 DAYS Performed at [facility], [address], [place], [place] [address]    Report Status PENDING  Incomplete  Culture, blood (Routine X 2) w Reflex to ID Panel     Status: None (Preliminary  result)   Collection Time: 06/25/22  1:29 AM   Specimen: BLOOD  Result Value Ref Range Status   Specimen Description   Final    BLOOD BLOOD LEFT HAND Performed at [facility], [address], [place], [place] [address]    Special Requests   Final    BOTTLES DRAWN AEROBIC ONLY Blood Culture adequate volume Performed at [facility], [address] [place], [place] [address]    Culture   Final    NO GROWTH 2 DAYS Performed at [facility], [address], [place], [place] [address]    Report Status PENDING  Incomplete  Urine Culture (for pregnant, neutropenic or urologic patients or patients with an indwelling urinary catheter)     Status: Abnormal   Collection Time: 06/25/22  2:11 AM   Specimen: Urine, Clean Catch  Result Value Ref Range Status   Specimen Description   Final    URINE, CLEAN CATCH Performed at [facility], [address] [place], [place] [address]    Special Requests   Final    NONE Performed at [facility], [address], [place], [place] [address]    Culture MULTIPLE SPECIES PRESENT, SUGGEST RECOLLECTION (A)  Final   Report Status 06/26/2022 FINAL  Final  Culture, Respiratory w Gram Stain     Status: None (Preliminary result)   Collection Time: 06/26/22 11:55 AM   Specimen: Sputum; Respiratory  Result Value Ref Range Status   Specimen Description   Final    SPU Performed at [facility], [address] [place], [place] [address]    Special Requests   Final    Immunocompromised Performed at [facility], [address] [place], [place] [address]    Gram Stain   Final    ABUNDANT WBC PRESENT, PREDOMINANTLY PMN FEW SQUAMOUS EPITHELIAL CELLS PRESENT RARE GRAM POSITIVE RODS MODERATE YEAST Performed at [facility], [address] [place], [place] [address]    Culture PENDING  Incomplete   Report Status PENDING  Incomplete   MRSA Next Gen by PCR, Nasal  Status: None   Collection Time: 06/26/22 11:55 AM   Specimen: Nasal Mucosa; Nasal Swab  Result Value Ref Range Status   MRSA by PCR Next Gen NOT DETECTED NOT DETECTED Final    Comment: (NOTE) The GeneXpert MRSA Assay (FDA approved for NASAL specimens only), is one component of a comprehensive MRSA colonization surveillance program. It is not intended to diagnose MRSA infection nor to guide or monitor treatment for MRSA infections. Test performance is not FDA approved in patients less than 60 years old. Performed at [facility], [address], [place], [place] [address]          Radiology Studies: No results found.      Scheduled Meds:  buPROPion  300 mg Oral Daily   Chlorhexidine Gluconate Cloth  6 each Topical Daily   Darunavir-Cobicistat-Emtricitabine-Tenofovir Alafenamide  1 tablet Oral Q breakfast   enoxaparin (LOVENOX) injection  40 mg Subcutaneous Q24H   fluconazole  200 mg Oral Daily   folic acid  1 mg Oral Daily   gabapentin  800 mg Oral TID   magic mouthwash w/lidocaine  5 mL Oral QID   metroNIDAZOLE  500 mg Oral Q12H   multivitamin with minerals  1 tablet Oral Daily   rosuvastatin  5 mg Oral Daily   thiamine  100 mg Oral Daily   Or   thiamine  100 mg Intravenous Daily   Continuous Infusions:  sodium chloride 75 mL/hr at 06/27/22 0531   ceFEPime (MAXIPIME) IV 2 g (06/27/22 1052)     LOS: 3 days    Time spent: 39 min  Alwyn Ren, MD  06/27/2022, 11:11 AM

## 2022-06-27 NOTE — Progress Notes (Signed)
Mobility Specialist - Progress Note   06/27/22 0915  Mobility  Activity Ambulated with assistance in hallway  Level of Assistance Minimal assist, patient does 75% or more  Assistive Device Other (Comment) (rollator)  Distance Ambulated (ft) 150 ft  Range of Motion/Exercises Active Assistive  Activity Response Tolerated well  Mobility Referral Yes  $Mobility charge 1 Mobility  Mobility Specialist Start Time (ACUTE ONLY) 0855  Mobility Specialist Stop Time (ACUTE ONLY) 0915  Mobility Specialist Time Calculation (min) (ACUTE ONLY) 20 min   Pt was found in bed and agreeable to ambulate. Had no complaints and at EOS returned to sit EOB with all needs met. Call bell in reach.  Billey Chang Mobility Specialist

## 2022-06-28 ENCOUNTER — Ambulatory Visit: Payer: Medicare Other

## 2022-06-28 ENCOUNTER — Other Ambulatory Visit: Payer: Self-pay

## 2022-06-28 ENCOUNTER — Ambulatory Visit
Admission: RE | Admit: 2022-06-28 | Discharge: 2022-06-28 | Disposition: A | Discharge status: Home / Self Care | Payer: Medicare Other | Source: Ambulatory Visit | Attending: Radiation Oncology | Admitting: Radiation Oncology

## 2022-06-28 ENCOUNTER — Inpatient Hospital Stay: Payer: Medicare Other

## 2022-06-28 ENCOUNTER — Ambulatory Visit: Payer: Medicare Other | Admitting: Adult Health

## 2022-06-28 DIAGNOSIS — C4492 Squamous cell carcinoma of skin, unspecified: Secondary | ICD-10-CM

## 2022-06-28 DIAGNOSIS — L8990 Pressure ulcer of unspecified site, unspecified stage: Secondary | ICD-10-CM | POA: Insufficient documentation

## 2022-06-28 DIAGNOSIS — J189 Pneumonia, unspecified organism: Secondary | ICD-10-CM | POA: Diagnosis not present

## 2022-06-28 DIAGNOSIS — B20 Human immunodeficiency virus [HIV] disease: Secondary | ICD-10-CM | POA: Diagnosis not present

## 2022-06-28 DIAGNOSIS — F17210 Nicotine dependence, cigarettes, uncomplicated: Secondary | ICD-10-CM | POA: Diagnosis not present

## 2022-06-28 LAB — COMPREHENSIVE METABOLIC PANEL
ALT: 23 U/L (ref 0–44)
AST: 20 U/L (ref 15–41)
Albumin: 2.2 g/dL — ABNORMAL LOW (ref 3.5–5.0)
Alkaline Phosphatase: 74 U/L (ref 38–126)
Anion gap: 7 (ref 5–15)
BUN: 16 mg/dL (ref 8–23)
CO2: 26 mmol/L (ref 22–32)
Calcium: 7.9 mg/dL — ABNORMAL LOW (ref 8.9–10.3)
Chloride: 101 mmol/L (ref 98–111)
Creatinine, Ser: 0.89 mg/dL (ref 0.61–1.24)
GFR, Estimated: >60 mL/min (ref >60)
Glucose, Bld: 98 mg/dL (ref 70–99)
Potassium: 3.6 mmol/L (ref 3.5–5.1)
Sodium: 134 mmol/L — ABNORMAL LOW (ref 135–145)
Total Bilirubin: 0.4 mg/dL (ref 0.3–1.2)
Total Protein: 5.9 g/dL — ABNORMAL LOW (ref 6.5–8.1)

## 2022-06-28 LAB — RAD ONC ARIA SESSION SUMMARY
Course Elapsed Days: 51
Plan Fractions Treated to Date: 8
Plan Prescribed Dose Per Fraction: 2 Gy
Plan Total Fractions Prescribed: 13
Plan Total Prescribed Dose: 26 Gy
Reference Point Dosage Given to Date: 16 Gy
Reference Point Session Dosage Given: 2 Gy
Session Number: 30

## 2022-06-28 LAB — CBC
HCT: 25.5 % — ABNORMAL LOW (ref 39.0–52.0)
Hemoglobin: 7.9 g/dL — ABNORMAL LOW (ref 13.0–17.0)
MCH: 26.8 pg (ref 26.0–34.0)
MCHC: 31 g/dL (ref 30.0–36.0)
MCV: 86.4 fL (ref 80.0–100.0)
Platelets: 447 10*3/uL — ABNORMAL HIGH (ref 150–400)
RBC: 2.95 MIL/uL — ABNORMAL LOW (ref 4.22–5.81)
RDW: 18.7 % — ABNORMAL HIGH (ref 11.5–15.5)
WBC: 5.7 10*3/uL (ref 4.0–10.5)
nRBC: 0 % (ref 0.0–0.2)

## 2022-06-28 LAB — RESPIRATORY PANEL BY PCR

## 2022-06-28 LAB — LEGIONELLA PNEUMOPHILA SEROGP 1 UR AG: L. pneumophila Serogp 1 Ur Ag: NEGATIVE

## 2022-06-28 LAB — CULTURE, RESPIRATORY W GRAM STAIN: Culture: NORMAL

## 2022-06-28 MED ORDER — SODIUM CHLORIDE 0.9 % IV SOLN
3.0000 g | Freq: Four times a day (QID) | INTRAVENOUS | Status: DC
  Administered 2022-06-28 – 2022-06-29 (×3): 3 g via INTRAVENOUS
  Filled 2022-06-28 (×5): qty 8

## 2022-06-28 MED ORDER — OXYCODONE HCL 5 MG PO TABS
10.0000 mg | ORAL_TABLET | ORAL | Status: DC | PRN
  Administered 2022-06-28 – 2022-06-30 (×8): 10 mg via ORAL
  Filled 2022-06-28 (×8): qty 2

## 2022-06-28 MED ORDER — OXYCODONE HCL 5 MG PO TABS: 10.0000 mg | ORAL_TABLET | ORAL | Status: DC | PRN

## 2022-06-28 MED ORDER — OXYCODONE HCL 5 MG PO TABS
5.0000 mg | ORAL_TABLET | ORAL | Status: AC
  Administered 2022-06-28: 5 mg via ORAL
  Filled 2022-06-28: qty 1

## 2022-06-28 MED FILL — Dexamethasone Sodium Phosphate Inj 100 MG/10ML: INTRAMUSCULAR | Qty: 1 | Status: AC

## 2022-06-28 NOTE — Progress Notes (Signed)
Per lab, no respiratory virus panel/swab received. Test repeated and sent to lab at this time.

## 2022-06-28 NOTE — Progress Notes (Signed)
[facility] Infectious Diseases Follow Up Note  Patient Identification: Patient Name: [name] MRN: [id number] Admit Date: 06/24/2022  7:47 PM Age: 62 y.o.Today's Date: 06/28/2022  Reason for Visit: Multifocal pneumonia  Principal Problem:   Acute exacerbation of CHF (congestive heart failure) (HCC) Active Problems:   HIV disease (HCC)   Anal condyloma   MDD (major depressive disorder), recurrent severe, without psychosis (HCC)   Substance use disorder   Alcohol abuse with alcohol-induced mood disorder (HCC)   Polysubstance dependence (HCC)   Tricompartment osteoarthritis of right knee   Left-sided weakness   Stenosis of cervical spine with myelopathy (HCC)   Alcohol dependence with alcohol-induced mood disorder (HCC)   Tobacco abuse   SCC (squamous cell carcinoma)   Pneumonia of lower lobe due to infectious organism   Multifocal pneumonia   Shortness of breath  Antibiotics:  Doxycycline 6/15 Cefepime 6/15-c Metronidazole 6/17-c Ceftriaxone 6/15   Symtuza 6/16-c   Lines/Hardware: RT chest port     Interval Events: Afebrile Labs remarkable for no leukocytosis Remains on room air Getting radiation therapy    Assessment 62 year old male with PMH of HIV well controlled on Symtuza, anal condyloma,cervical stenosis w myelopathy, depression, substance abuse, 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   # Multifocal pna Atypical cyst in RUL Endobronchial mass in left main stem bronchus, considered benign etiology  Suspect community acquired etiology vs aspiration, less likely HAP or Ois 6/17 respiratory cx normal respiratory flora.    # HIV  Well controlled on symtuza, not on bactrim  ppx as CD4 count mostly over 200 Lab Results  Component Value Date   HIV1RNAQUANT 30 04/22/2022   Lab Results  Component Value Date   CD4TABS 225 (L) 04/24/2022   CD4TABS 515 09/22/2019   CD4TABS 433 06/18/2019   # Thrush ? Esophageal candidiasis  Appears patient was started on Fluconazole on 5/31 for 3 weeks course  On Fluconazole   Recommendations Will switch IV cefepime and metronidazole to Unasyn. Suspect source of PNA aspiration related given h/o oropharyngeal ca and some difficulty in swallowing. Low suspicion for OIS as HIV is well controlled and neutropenia is resolved. Plan for augmentin at discharge. EOT 6/29.  Fu RVP He has some questions about his radiation treatment which I will defer to primary/Onc team  Complete 3 weeks course of fluconazole Plan to follow-up CT chest in 3 months to assess for resolution of abnormalities given the presence of an 8 mm atypical cyst in the RIGHT upper lobe.  Rest of the management as per the primary team. Thank you for the consult. Please page with pertinent questions or concerns.  ______________________________________________________________________ Subjective patient seen and examined at the bedside. Coughing, reports productive with thick yellowish phlegm.  Appetite is still not good.  Feels however better.  Spoke to his sister over phone per his request.  Per RN, he is anxious for his radiation  Vitals BP 114/67 (BP Location: Left Arm)   Pulse 79   Temp 98 F (36.7 C) (Oral)   Resp 18   Ht 6' (1.829 m)   Wt 62.5 kg   SpO2 99%   BMI 18.69 kg/m     Physical Exam Constitutional: Elderly male sitting in the bed and coughing  Comments: No thrush in oropharynx  Cardiovascular:     Rate and Rhythm: Normal rate and regular rhythm.     Heart sounds:  Pulmonary:     Effort: Pulmonary effort is normal on room air     Comments:   Abdominal:     Palpations: Abdomen is soft.     Tenderness: Nondistended and  nontender  Musculoskeletal:        General: No swelling or tenderness.  Bilateral pedal swelling with no signs of cellulitis  Skin:    Comments: No rashes, right chest port with no signs of infection  Neurological:     General: Awake, alert and oriented, following commands  Psychiatric:        Mood and Affect: Mood normal.   Pertinent labs     Latest Ref Rng & Units 06/28/2022    3:21 AM 06/27/2022    4:08 AM 06/25/2022    1:29 AM  CBC  WBC 4.0 - 10.5 K/uL 5.7  6.5  3.4   Hemoglobin 13.0 - 17.0 g/dL 7.9  8.3  8.3   Hematocrit 39.0 - 52.0 % 25.5  26.4  26.1   Platelets 150 - 400 K/uL 447  527  393       Latest Ref Rng & Units 06/28/2022    3:21 AM 06/27/2022    4:08 AM 06/25/2022    1:29 AM  CMP  Glucose 70 - 99 mg/dL 98  161  096   BUN 8 - 23 mg/dL 16  15  16    Creatinine 0.61 - 1.24 mg/dL 0.45  4.09  8.11   Sodium 135 - 145 mmol/L 134  135  134   Potassium 3.5 - 5.1 mmol/L 3.6  3.5  3.7   Chloride 98 - 111 mmol/L 101  101  98   CO2 22 - 32 mmol/L 26  28  28    Calcium 8.9 - 10.3 mg/dL 7.9  8.0  8.2   Total Protein 6.5 - 8.1 g/dL 5.9  5.7    Total Bilirubin 0.3 - 1.2 mg/dL 0.4  0.4    Alkaline Phos 38 - 126 U/L 74  70    AST 15 - 41 U/L 20  18    ALT 0 - 44 U/L 23  21     Pertinent Microbiology Results for orders placed or performed during the hospital encounter of 06/24/22  Culture, blood (Routine X 2) w Reflex to ID Panel     Status: None (Preliminary result)   Collection Time: 06/25/22  1:29 AM   Specimen: BLOOD  Result Value Ref Range Status   Specimen Description   Final    BLOOD BLOOD LEFT ARM Performed at [facility], [address], [place], [place] [address]    Special Requests   Final    BOTTLES DRAWN AEROBIC AND ANAEROBIC Blood Culture adequate volume Performed at [facility], [address] [place], [place] [address]    Culture   Final    NO GROWTH 3 DAYS Performed at [facility], [address]  [place], [place] [address]    Report Status PENDING  Incomplete  Culture, blood (Routine X 2) w Reflex to ID Panel     Status: None (Preliminary result)   Collection Time: 06/25/22  1:29 AM   Specimen: BLOOD  Result Value Ref Range Status   Specimen Description   Final    BLOOD BLOOD LEFT HAND Performed at [facility], [address] [place], [place] [address]    Special Requests   Final    BOTTLES DRAWN AEROBIC ONLY Blood Culture adequate volume Performed at [facility], [address], [place], [place] [address]    Culture   Final    NO GROWTH 3 DAYS Performed at [facility], [address], [place], [place] [address]    Report Status PENDING  Incomplete  Urine Culture (for pregnant, neutropenic or urologic patients or patients with an indwelling urinary catheter)     Status: Abnormal   Collection Time: 06/25/22  2:11 AM   Specimen: Urine, Clean Catch  Result Value Ref Range Status   Specimen Description   Final    URINE, CLEAN CATCH Performed at [facility], [address], [place], [place] [address]    Special Requests   Final    NONE Performed at [facility], [address] [place], [place] [address]    Culture MULTIPLE SPECIES PRESENT, SUGGEST RECOLLECTION (A)  Final   Report Status 06/26/2022 FINAL  Final  Culture, Respiratory w Gram Stain     Status: None (Preliminary result)   Collection Time: 06/26/22 11:55 AM   Specimen: Sputum; Respiratory  Result Value Ref Range Status   Specimen Description   Final    SPU Performed at [facility], [address], [place], [place] [address]    Special Requests   Final    Immunocompromised Performed at [facility], [address], [place], [place] [address]    Gram Stain   Final    ABUNDANT WBC PRESENT, PREDOMINANTLY PMN FEW SQUAMOUS EPITHELIAL CELLS PRESENT RARE GRAM POSITIVE RODS MODERATE YEAST     Culture   Final    CULTURE REINCUBATED FOR BETTER GROWTH Performed at [facility], [address], [place], [place] [address]    Report Status PENDING  Incomplete  MRSA Next Gen by PCR, Nasal     Status: None   Collection Time: 06/26/22 11:55 AM   Specimen: Nasal Mucosa; Nasal Swab  Result Value Ref Range Status   MRSA by PCR Next Gen NOT DETECTED NOT DETECTED Final    Comment: (NOTE) The GeneXpert MRSA Assay (FDA approved for NASAL specimens only), is one component of a comprehensive MRSA colonization surveillance program. It is not intended to diagnose MRSA infection nor to guide or monitor treatment for MRSA infections. Test performance is not FDA approved in patients less than 31 years old. Performed at [facility], [address] [place], [place] [address]     Pertinent Imaging today Plain films and CT images have been personally visualized and interpreted; radiology reports have been reviewed. Decision making incorporated into the Impression  No results found.   I have personally spent 52 minutes involved in face-to-face and non-face-to-face activities for this patient on the day of the visit. Professional time spent includes the following activities: Preparing to see the patient (review of tests), Obtaining and/or reviewing separately obtained history (admission/discharge record), Performing a medically appropriate examination and/or evaluation , Ordering medications/tests/procedures, referring and communicating with other health care professionals, Documenting clinical information in the EMR, Independently interpreting results (not separately reported), Communicating results to the patient/family/caregiver, Counseling and educating the patient/family/caregiver and Care coordination (not separately reported).   Plan d/w requesting provider as well as ID pharm D  Note: This document was prepared using dragon voice recognition software and may include  unintentional dictation  errors.   Electronically signed by:   Odette Fraction, MD Infectious Disease Physician [facility] Pager: [phone]

## 2022-06-28 NOTE — Hospital Course (Addendum)
Mr. Lyles is a 62 yo male with PMH SCC left neck recently diagnosed on 03/14/2022 (left palatine tonsil).  Also has adenopathy consistent with nodal metastasis.  Also PMH of HIV. He states he has since quit smoking since his April hospitalization.  He has been undergoing outpatient radiation treatments.  He presented to the hospital due to generalized malaise, difficulty with swallowing at times, pleuritic chest pain, and cough. He underwent imaging with CT chest which showed multifocal peribronchovascular groundglass opacities throughout both lungs; most confluent at the lung bases.  He had also recently been started on Diflucan for presumed Candida. Infectious disease was consulted and he was started on antibiotics.  He was initiated on Unasyn which was transitioned to Augmentin to complete course at discharge.  Prior fluconazole course was also recommended to be completed.

## 2022-06-28 NOTE — Progress Notes (Signed)
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  Also has adenopathy consistent with nodal metastasis.  Also PMH of HIV. He states he has since quit smoking since his April hospitalization.  He has been undergoing outpatient radiation treatments.  He presented to the hospital due to generalized malaise, difficulty with swallowing at times, pleuritic chest pain, and cough. He underwent imaging with CT chest which showed multifocal peribronchovascular groundglass opacities throughout both lungs; most confluent at the lung bases.  He had also recently been started on Diflucan for presumed Candida. Infectious disease was consulted and he was started on antibiotics.  Interval History:  No events overnight.  Patient well-known to me from April hospitalization.  He is still feeling poorly when seen today and having multiple secretions and using [name] for suction.  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 Currently not in chemotherapy due to neutropenia. Last chemo May 2024. - currently getting radiation    HIV on symtusa, last CD4 count 735 from February 2023.  ID following.   HTN - BP currently controlled; home meds on hold  Hyperlipidemia  - on Crestor   Anxiety  - on Wellbutrin   Etoh use - no s/s w/d - d/c ciwa   Old records reviewed in assessment of this patient  Antimicrobials: Fluconazole 06/26/2022 >> current Cefepime 06/25/2022 >> 6/19 Rocephin 6/15  x 1 Doxy 6/16 x 1 Unasyn 6/19 >> current   DVT prophylaxis:  enoxaparin (LOVENOX) injection 40 mg Start: 06/24/22 2215 SCDs Start: 06/24/22 2210   Code Status:   Code Status: Full Code  Mobility Assessment (last 72 hours)     Mobility Assessment     Row Name 06/28/22 0807 06/27/22 1930 06/27/22 1000 06/26/22 2000 06/26/22 0910   Does patient have an order for bedrest or is patient medically unstable No - Continue assessment No - Continue assessment No - Continue assessment No - Continue assessment No - Continue assessment   What is the highest level of mobility based on the progressive mobility assessment? Level 5 (Walks with assist in room/hall) - Balance while stepping forward/back and can walk in room with assist - Complete Level 5 (Walks with assist in room/hall) - Balance while stepping forward/back and can walk in room with assist - Complete Level 4 (Walks with assist in room) - Balance while marching in place and cannot step forward and back - Complete Level 4 (Walks with assist in room) - Balance while marching in place and cannot step forward and back - Complete Level 1 (Bedfast) - Unable to balance while sitting on edge of bed   Is the above level different from baseline mobility prior to current illness? -- Yes - Recommend PT order Yes - Recommend PT order Yes - Recommend PT order Yes - Recommend PT order    Row Name 06/25/22 2100           Does patient have an order for bedrest or  is patient medically unstable No - Continue assessment       What is the highest level of mobility based on the progressive mobility assessment? Level 5 (Walks with assist in room/hall) - Balance while stepping forward/back and can walk in room with assist - Complete                Barriers to discharge: none Disposition Plan:  Home Status is: Inpt  Objective: Blood pressure 124/87, pulse 82, temperature 98.1 F (36.7 C), temperature source Oral, resp. rate 16, height 6' (1.829 m), weight  62.5 kg, SpO2 99 %.  Examination:  Physical Exam Constitutional:      General: He is not in acute distress.    Appearance: Normal appearance.     Comments: Fatigued appearing and uncomfortable  HENT:     Head: Normocephalic and atraumatic.     Mouth/Throat:     Mouth: Mucous membranes are moist.  Eyes:     Extraocular Movements: Extraocular movements intact.  Neck:     Comments: R neck noted with lipoma (stable); left neck is indurated and firm c/w radiation treatments Cardiovascular:     Rate and Rhythm: Normal rate and regular rhythm.  Pulmonary:     Effort: Pulmonary effort is normal. No respiratory distress.     Breath sounds: Normal breath sounds. No wheezing.  Abdominal:     General: Bowel sounds are normal. There is no distension.     Palpations: Abdomen is soft.     Tenderness: There is no abdominal tenderness.  Musculoskeletal:        General: Normal range of motion.     Cervical back: Normal range of motion and neck supple.  Skin:    General: Skin is warm and dry.  Neurological:     General: No focal deficit present.  Psychiatric:        Mood and Affect: Mood normal.        Behavior: Behavior normal.      Consultants:    Procedures:    Data Reviewed: Results for orders placed or performed during the hospital encounter of 06/24/22 (from the past 24 hour(s))  CBC     Status: Abnormal   Collection Time: 06/28/22  3:21 AM  Result Value Ref Range   WBC 5.7 4.0 - 10.5 K/uL   RBC 2.95 (L) 4.22 - 5.81 MIL/uL   Hemoglobin 7.9 (L) 13.0 - 17.0 g/dL   HCT 16.1 (L) 09.6 - 04.5 %   MCV 86.4 80.0 - 100.0 fL   MCH 26.8 26.0 - 34.0 pg   MCHC 31.0 30.0 - 36.0 g/dL   RDW 40.9 (H) 81.1 - 91.4 %   Platelets 447 (H) 150 - 400 K/uL   nRBC 0.0 0.0 - 0.2 %  Comprehensive metabolic panel     Status: Abnormal   Collection Time: 06/28/22  3:21 AM  Result Value Ref Range   Sodium 134 (L) 135 - 145 mmol/L   Potassium 3.6 3.5 - 5.1 mmol/L   Chloride 101 98 - 111 mmol/L    CO2 26 22 - 32 mmol/L   Glucose, Bld 98 70 - 99 mg/dL   BUN 16 8 - 23 mg/dL   Creatinine, Ser 7.82 0.61 - 1.24 mg/dL   Calcium 7.9 (L) 8.9 - 10.3 mg/dL   Total Protein 5.9 (L) 6.5 - 8.1 g/dL   Albumin 2.2 (L) 3.5 - 5.0 g/dL   AST 20 15 - 41 U/L   ALT 23 0 -  44 U/L   Alkaline Phosphatase 74 38 - 126 U/L   Total Bilirubin 0.4 0.3 - 1.2 mg/dL   GFR, Estimated >46 >96 mL/min   Anion gap 7 5 - 15  Respiratory (~20 pathogens) panel by PCR     Status: None   Collection Time: 06/28/22 11:55 AM   Specimen: Nasopharyngeal Swab; Respiratory  Result Value Ref Range   Adenovirus NOT DETECTED NOT DETECTED   Coronavirus 229E NOT DETECTED NOT DETECTED   Coronavirus HKU1 NOT DETECTED NOT DETECTED   Coronavirus NL63 NOT DETECTED NOT DETECTED   Coronavirus OC43 NOT DETECTED NOT DETECTED   Metapneumovirus NOT DETECTED NOT DETECTED   Rhinovirus / Enterovirus NOT DETECTED NOT DETECTED   Influenza A NOT DETECTED NOT DETECTED   Influenza B NOT DETECTED NOT DETECTED   Parainfluenza Virus 1 NOT DETECTED NOT DETECTED   Parainfluenza Virus 2 NOT DETECTED NOT DETECTED   Parainfluenza Virus 3 NOT DETECTED NOT DETECTED   Parainfluenza Virus 4 NOT DETECTED NOT DETECTED   Respiratory Syncytial Virus NOT DETECTED NOT DETECTED   Bordetella pertussis NOT DETECTED NOT DETECTED   Bordetella Parapertussis NOT DETECTED NOT DETECTED   Chlamydophila pneumoniae NOT DETECTED NOT DETECTED   Mycoplasma pneumoniae NOT DETECTED NOT DETECTED    I have reviewed pertinent nursing notes, vitals, labs, and images as necessary. I have ordered labwork to follow up on as indicated.  I have reviewed the last notes from staff over past 24 hours. I have discussed patient's care plan and test results with nursing staff, CM/SW, and other staff as appropriate.  Time spent: Greater than 50% of the 55 minute visit was spent in counseling/coordination of care for the patient as laid out in the A&P.   LOS: 4 days   Lewie Chamber,  MD [facility] 06/28/2022, 5:30 PM

## 2022-06-29 ENCOUNTER — Ambulatory Visit: Payer: Medicare Other

## 2022-06-29 ENCOUNTER — Other Ambulatory Visit: Payer: Self-pay

## 2022-06-29 ENCOUNTER — Inpatient Hospital Stay: Payer: Medicare Other | Admitting: Adult Health

## 2022-06-29 ENCOUNTER — Inpatient Hospital Stay: Payer: Medicare Other | Admitting: Nutrition

## 2022-06-29 ENCOUNTER — Ambulatory Visit
Admission: RE | Admit: 2022-06-29 | Discharge: 2022-06-29 | Disposition: A | Discharge status: Home / Self Care | Payer: Medicare Other | Source: Ambulatory Visit | Attending: Radiation Oncology | Admitting: Radiation Oncology

## 2022-06-29 ENCOUNTER — Inpatient Hospital Stay: Payer: Medicare Other

## 2022-06-29 DIAGNOSIS — J189 Pneumonia, unspecified organism: Secondary | ICD-10-CM | POA: Diagnosis not present

## 2022-06-29 DIAGNOSIS — C4492 Squamous cell carcinoma of skin, unspecified: Secondary | ICD-10-CM | POA: Diagnosis not present

## 2022-06-29 DIAGNOSIS — B20 Human immunodeficiency virus [HIV] disease: Secondary | ICD-10-CM | POA: Diagnosis not present

## 2022-06-29 DIAGNOSIS — F17210 Nicotine dependence, cigarettes, uncomplicated: Secondary | ICD-10-CM | POA: Diagnosis not present

## 2022-06-29 LAB — COMPREHENSIVE METABOLIC PANEL
ALT: 22 U/L (ref 0–44)
AST: 20 U/L (ref 15–41)
Albumin: 2.2 g/dL — ABNORMAL LOW (ref 3.5–5.0)
Alkaline Phosphatase: 68 U/L (ref 38–126)
Anion gap: 6 (ref 5–15)
BUN: 14 mg/dL (ref 8–23)
CO2: 28 mmol/L (ref 22–32)
Calcium: 7.8 mg/dL — ABNORMAL LOW (ref 8.9–10.3)
Chloride: 101 mmol/L (ref 98–111)
Creatinine, Ser: 1.04 mg/dL (ref 0.61–1.24)
GFR, Estimated: >60 mL/min (ref >60)
Glucose, Bld: 94 mg/dL (ref 70–99)
Potassium: 3.1 mmol/L — ABNORMAL LOW (ref 3.5–5.1)
Sodium: 135 mmol/L (ref 135–145)
Total Bilirubin: 0.3 mg/dL (ref 0.3–1.2)
Total Protein: 5.6 g/dL — ABNORMAL LOW (ref 6.5–8.1)

## 2022-06-29 LAB — RAD ONC ARIA SESSION SUMMARY
Course Elapsed Days: 52
Plan Fractions Treated to Date: 9
Plan Prescribed Dose Per Fraction: 2 Gy
Plan Total Fractions Prescribed: 13
Plan Total Prescribed Dose: 26 Gy
Reference Point Dosage Given to Date: 18 Gy
Reference Point Session Dosage Given: 2 Gy
Session Number: 31

## 2022-06-29 LAB — CBC
HCT: 24.1 % — ABNORMAL LOW (ref 39.0–52.0)
Hemoglobin: 7.5 g/dL — ABNORMAL LOW (ref 13.0–17.0)
MCH: 27 pg (ref 26.0–34.0)
MCHC: 31.1 g/dL (ref 30.0–36.0)
MCV: 86.7 fL (ref 80.0–100.0)
Platelets: 415 10*3/uL — ABNORMAL HIGH (ref 150–400)
RBC: 2.78 MIL/uL — ABNORMAL LOW (ref 4.22–5.81)
RDW: 18.8 % — ABNORMAL HIGH (ref 11.5–15.5)
WBC: 6.1 10*3/uL (ref 4.0–10.5)
nRBC: 0 % (ref 0.0–0.2)

## 2022-06-29 LAB — CULTURE, BLOOD (ROUTINE X 2)

## 2022-06-29 LAB — MAGNESIUM: Magnesium: 1.5 mg/dL — ABNORMAL LOW (ref 1.7–2.4)

## 2022-06-29 MED ORDER — MAGNESIUM SULFATE 2 GM/50ML IV SOLN
2.0000 g | Freq: Once | INTRAVENOUS | Status: AC
  Administered 2022-06-29: 2 g via INTRAVENOUS
  Filled 2022-06-29: qty 50

## 2022-06-29 MED ORDER — POTASSIUM CHLORIDE CRYS ER 20 MEQ PO TBCR
40.0000 meq | EXTENDED_RELEASE_TABLET | Freq: Once | ORAL | Status: AC
  Administered 2022-06-29: 40 meq via ORAL
  Filled 2022-06-29: qty 2

## 2022-06-29 MED ORDER — AMOXICILLIN-POT CLAVULANATE 875-125 MG PO TABS
1.0000 | ORAL_TABLET | Freq: Two times a day (BID) | ORAL | Status: DC
  Administered 2022-06-29 – 2022-06-30 (×3): 1 via ORAL
  Filled 2022-06-29 (×3): qty 1

## 2022-06-29 NOTE — Progress Notes (Signed)
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  Also has adenopathy consistent with nodal metastasis.  Also PMH of HIV. He states he has since quit smoking since his April hospitalization.  He has been undergoing outpatient radiation treatments.  He presented to the hospital due to generalized malaise, difficulty with swallowing at times, pleuritic chest pain, and cough. He underwent imaging with CT chest which showed multifocal peribronchovascular groundglass opacities throughout both lungs; most confluent at the lung bases.  He had also recently been started on Diflucan for presumed Candida. Infectious disease was consulted and he was started on antibiotics.  Interval History:  No events overnight.  Sitting on edge of bed with [name] this afternoon.  Patient unable to get into his house and family out of town therefore he was unable to discharge today.  Assessment and Plan:  Multifocal pneumonia - B/L opacities noted on CT chest -RVP negative - ID also following; some risk for aspiration given swallowing issues - abx transitioned to Unasyn today; plan is for Augmentin at discharge with completion date of 07/08/2022 - per CT chest: "Recommend follow-up CT in 3 months to assess for resolution given the presence of an 8 mm atypical cyst in the RIGHT upper lobe.   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 Currently not in chemotherapy due to neutropenia. Last chemo May 2024. - currently getting radiation    HIV on symtusa, last CD4 count 735 from February 2023.  ID following.   HTN - BP currently controlled; home meds on hold  Hyperlipidemia  - on Crestor   Anxiety  - on Wellbutrin   Etoh use - no s/s w/d - d/c ciwa    Old records reviewed in assessment of this patient  Antimicrobials: Fluconazole 06/26/2022 >> current Cefepime 06/25/2022 >> 6/19 Rocephin 6/15 x 1 Doxy 6/16 x 1 Unasyn 6/19 >> 6/20 Augmentin 6/20 >> current    DVT prophylaxis:  enoxaparin (LOVENOX) injection 40 mg Start: 06/24/22 2215 SCDs Start: 06/24/22 2210   Code Status:   Code Status: Full Code  Mobility Assessment (last 72 hours)     Mobility Assessment     Row Name 06/28/22 2100 06/28/22 0807 06/27/22 1930 06/27/22 1000 06/26/22 2000   Does patient have an order for bedrest or is patient medically unstable No - Continue assessment No - Continue assessment No - Continue assessment No - Continue assessment No - Continue assessment   What is the highest level of mobility based on the progressive mobility assessment? Level 5 (Walks with assist in room/hall) - Balance while stepping forward/back and can walk in room with assist - Complete Level 5 (Walks with assist in room/hall) - Balance while stepping forward/back and can walk in room with assist - Complete Level 5 (Walks with assist in room/hall) - Balance while stepping forward/back and can walk in room with assist - Complete Level 4 (Walks with assist in room) - Balance while marching in place and cannot step forward and back - Complete Level 4 (Walks with assist in room) - Balance while marching in place and cannot step forward and back - Complete   Is the above level different from baseline mobility prior to current illness? -- --  Yes - Recommend PT order Yes - Recommend PT order Yes - Recommend PT order            Barriers to discharge: none Disposition Plan:  Home Status is: Inpt  Objective: Blood pressure 120/82, pulse 74, temperature 97.7 F (36.5 C), temperature source Oral, resp. rate 18, height 6' (1.829 m), weight 62.5 kg, SpO2 99 %.  Examination:  Physical Exam Constitutional:      General: He is not in acute distress.    Appearance: Normal appearance.      Comments: Fatigued appearing and uncomfortable  HENT:     Head: Normocephalic and atraumatic.     Mouth/Throat:     Mouth: Mucous membranes are moist.  Eyes:     Extraocular Movements: Extraocular movements intact.  Neck:     Comments: R neck noted with lipoma (stable); left neck is mildly indurated c/w radiation treatments Cardiovascular:     Rate and Rhythm: Normal rate and regular rhythm.  Pulmonary:     Effort: Pulmonary effort is normal. No respiratory distress.     Breath sounds: Normal breath sounds. No wheezing.  Abdominal:     General: Bowel sounds are normal. There is no distension.     Palpations: Abdomen is soft.     Tenderness: There is no abdominal tenderness.  Musculoskeletal:        General: Normal range of motion.     Cervical back: Normal range of motion and neck supple.  Skin:    General: Skin is warm and dry.  Neurological:     General: No focal deficit present.  Psychiatric:        Mood and Affect: Mood normal.        Behavior: Behavior normal.      Consultants:    Procedures:    Data Reviewed: Results for orders placed or performed during the hospital encounter of 06/24/22 (from the past 24 hour(s))  CBC     Status: Abnormal   Collection Time: 06/29/22  4:04 AM  Result Value Ref Range   WBC 6.1 4.0 - 10.5 K/uL   RBC 2.78 (L) 4.22 - 5.81 MIL/uL   Hemoglobin 7.5 (L) 13.0 - 17.0 g/dL   HCT 14.7 (L) 82.9 - 56.2 %   MCV 86.7 80.0 - 100.0 fL   MCH 27.0 26.0 - 34.0 pg   MCHC 31.1 30.0 - 36.0 g/dL   RDW 13.0 (H) 86.5 - 78.4 %   Platelets 415 (H) 150 - 400 K/uL   nRBC 0.0 0.0 - 0.2 %  Comprehensive metabolic panel     Status: Abnormal   Collection Time: 06/29/22  4:04 AM  Result Value Ref Range   Sodium 135 135 - 145 mmol/L   Potassium 3.1 (L) 3.5 - 5.1 mmol/L   Chloride 101 98 - 111 mmol/L   CO2 28 22 - 32 mmol/L   Glucose, Bld 94 70 - 99 mg/dL   BUN 14 8 - 23 mg/dL   Creatinine, Ser 6.96 0.61 - 1.24 mg/dL   Calcium 7.8 (L) 8.9 - 10.3  mg/dL   Total Protein 5.6 (L) 6.5 - 8.1 g/dL   Albumin 2.2 (L) 3.5 - 5.0 g/dL   AST 20 15 - 41 U/L   ALT 22 0 - 44 U/L   Alkaline Phosphatase 68 38 - 126 U/L   Total Bilirubin 0.3 0.3 - 1.2 mg/dL   GFR, Estimated >29 >52 mL/min   Anion gap 6 5 - 15  Magnesium  Status: Abnormal   Collection Time: 06/29/22  4:04 AM  Result Value Ref Range   Magnesium 1.5 (L) 1.7 - 2.4 mg/dL    I have reviewed pertinent nursing notes, vitals, labs, and images as necessary. I have ordered labwork to follow up on as indicated.  I have reviewed the last notes from staff over past 24 hours. I have discussed patient's care plan and test results with nursing staff, CM/SW, and other staff as appropriate.  Time spent: Greater than 50% of the 55 minute visit was spent in counseling/coordination of care for the patient as laid out in the A&P.   LOS: 5 days   Lewie Chamber, MD [facility] 06/29/2022, 4:39 PM

## 2022-06-29 NOTE — Progress Notes (Signed)
[facility] Infectious Diseases Follow Up Note  Patient Identification: Patient Name: [name] MRN: [id number] Admit Date: 06/24/2022  7:47 PM Age: 62 y.o.Today's Date: 06/29/2022  Reason for Visit: Multifocal pneumonia  Principal Problem:   Multifocal pneumonia Active Problems:   HIV disease (HCC)   Anal condyloma   MDD (major depressive disorder), recurrent severe, without psychosis (HCC)   Substance use disorder   Alcohol abuse with alcohol-induced mood disorder (HCC)   Polysubstance dependence (HCC)   Tricompartment osteoarthritis of right knee   Left-sided weakness   Stenosis of cervical spine with myelopathy (HCC)   Alcohol dependence with alcohol-induced mood disorder (HCC)   Tobacco abuse   SCC (squamous cell carcinoma)   Pneumonia of lower lobe due to infectious organism   Shortness of breath   Pressure injury of skin  Antibiotics:  Doxycycline 6/15 Cefepime 6/15-6/19 Metronidazole 6/17-6/19 Ceftriaxone 6/15 Unasyn 6/19-c   Symtuza 6/16-c   Lines/Hardware: RT chest port     Interval Events: Afebrile Labs remarkable hypokalemia, hypomagnesemia, no leukocytosis Remains on room air  Assessment 62 year old male with PMH of HIV well controlled on Symtuza, anal condyloma,cervical stenosis w myelopathy, depression, substance abuse, 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   # Multifocal pna Atypical cyst in RUL Endobronchial mass in left main stem bronchus, considered benign etiology  Suspect community acquired etiology vs aspiration, less likely HAP or Ois 6/17 respiratory cx normal respiratory flora 6/19 RVP negative Clinically improving   # HIV  Well controlled on  symtuza, not on bactrim ppx as CD4 count mostly over 200  # Thrush ? Esophageal candidiasis  Appears patient was started on Fluconazole on 5/31 for 3 weeks course  On Fluconazole   Recommendations Continue IV Unasyn IP. Suspect source of PNA aspiration related given h/o oropharyngeal ca and some difficulty in swallowing. Low suspicion for OIS as HIV is well controlled and neutropenia is resolved. Plan for augmentin at discharge. EOT 6/29. Complete 3 weeks course of fluconazole Plan to follow-up CT chest in 3 months to assess for resolution of abnormalities given the presence of an 8 mm atypical cyst in the RIGHT upper lobe.  Rest of the management as per the primary team. Thank you for the consult. Please page with pertinent questions or concerns.  ______________________________________________________________________ Subjective Patient was seen walking with the help of walker at hallway. Reports he is improving and has produced a lot of productive phlegm  Vitals BP 120/82 (BP Location: Right Arm)   Pulse 74   Temp 97.7 F (36.5 C) (Oral)   Resp 18   Ht 6' (1.829 m)   Wt 62.5 kg   SpO2 99%   BMI 18.69 kg/m    Pertinent labs     Latest Ref Rng & Units 06/29/2022    4:04 AM 06/28/2022    3:21 AM 06/27/2022    4:08 AM  CBC  WBC 4.0 - 10.5 K/uL 6.1  5.7  6.5   Hemoglobin 13.0 - 17.0 g/dL 7.5  7.9  8.3   Hematocrit 39.0 - 52.0 % 24.1  25.5  26.4   Platelets 150 - 400 K/uL 415  447  527       Latest Ref Rng & Units 06/29/2022    4:04  AM 06/28/2022    3:21 AM 06/27/2022    4:08 AM  CMP  Glucose 70 - 99 mg/dL 94  98  161   BUN 8 - 23 mg/dL 14  16  15    Creatinine 0.61 - 1.24 mg/dL 0.96  0.45  4.09   Sodium 135 - 145 mmol/L 135  134  135   Potassium 3.5 - 5.1 mmol/L 3.1  3.6  3.5   Chloride 98 - 111 mmol/L 101  101  101   CO2 22 - 32 mmol/L 28  26  28    Calcium 8.9 - 10.3 mg/dL 7.8  7.9  8.0   Total Protein 6.5 - 8.1 g/dL 5.6  5.9  5.7   Total Bilirubin 0.3 - 1.2 mg/dL 0.3   0.4  0.4   Alkaline Phos 38 - 126 U/L 68  74  70   AST 15 - 41 U/L 20  20  18    ALT 0 - 44 U/L 22  23  21     Pertinent Microbiology Results for orders placed or performed during the hospital encounter of 06/24/22  Culture, blood (Routine X 2) w Reflex to ID Panel     Status: None (Preliminary result)   Collection Time: 06/25/22  1:29 AM   Specimen: BLOOD  Result Value Ref Range Status   Specimen Description   Final    BLOOD BLOOD LEFT ARM Performed at [facility], [address] [place], [place] [address]    Special Requests   Final    BOTTLES DRAWN AEROBIC AND ANAEROBIC Blood Culture adequate volume Performed at [facility], [address] [place], [place] [address]    Culture   Final    NO GROWTH 4 DAYS Performed at [facility], [address], [place], [place] [address]    Report Status PENDING  Incomplete  Culture, blood (Routine X 2) w Reflex to ID Panel     Status: None (Preliminary result)   Collection Time: 06/25/22  1:29 AM   Specimen: BLOOD  Result Value Ref Range Status   Specimen Description   Final    BLOOD BLOOD LEFT HAND Performed at [facility], [address] [place], [place] [address]    Special Requests   Final    BOTTLES DRAWN AEROBIC ONLY Blood Culture adequate volume Performed at [facility], [address], [place], [place] [address]    Culture   Final    NO GROWTH 4 DAYS Performed at [facility], [address], [place], [place] [address]    Report Status PENDING  Incomplete  Urine Culture (for pregnant, neutropenic or urologic patients or patients with an indwelling urinary catheter)     Status: Abnormal   Collection Time: 06/25/22  2:11 AM   Specimen: Urine, Clean Catch  Result Value Ref Range Status   Specimen Description   Final    URINE, CLEAN CATCH Performed at [facility], [address] [place], [place] [address]     Special Requests   Final    NONE Performed at [facility], [address] [place], [place] [address]    Culture MULTIPLE SPECIES PRESENT, SUGGEST RECOLLECTION (A)  Final   Report Status 06/26/2022 FINAL  Final  Culture, Respiratory w Gram Stain     Status: None   Collection Time: 06/26/22 11:55 AM   Specimen: Sputum; Respiratory  Result Value Ref Range Status  Specimen Description   Final    SPU Performed at [facility], [address], [place], [place] [address]    Special Requests   Final    Immunocompromised Performed at [facility], [address], [place], [place] [address]    Gram Stain   Final    ABUNDANT WBC PRESENT, PREDOMINANTLY PMN FEW SQUAMOUS EPITHELIAL CELLS PRESENT RARE GRAM POSITIVE RODS MODERATE YEAST    Culture   Final    FEW Normal respiratory flora-no Staph aureus or Pseudomonas seen Performed at [facility], [address], [place], [place] [address]    Report Status 06/28/2022 FINAL  Final  MRSA Next Gen by PCR, Nasal     Status: None   Collection Time: 06/26/22 11:55 AM   Specimen: Nasal Mucosa; Nasal Swab  Result Value Ref Range Status   MRSA by PCR Next Gen NOT DETECTED NOT DETECTED Final    Comment: (NOTE) The GeneXpert MRSA Assay (FDA approved for NASAL specimens only), is one component of a comprehensive MRSA colonization surveillance program. It is not intended to diagnose MRSA infection nor to guide or monitor treatment for MRSA infections. Test performance is not FDA approved in patients less than 37 years old. Performed at [facility], [address], [place], [place] [address]   Respiratory (~20 pathogens) panel by PCR     Status: None   Collection Time: 06/28/22 11:55 AM   Specimen: Nasopharyngeal Swab; Respiratory  Result Value Ref Range Status   Adenovirus NOT DETECTED NOT DETECTED Final   Coronavirus 229E NOT DETECTED NOT DETECTED Final     Comment: (NOTE) The Coronavirus on the Respiratory Panel, DOES NOT test for the novel  Coronavirus (2019 nCoV)    Coronavirus HKU1 NOT DETECTED NOT DETECTED Final   Coronavirus NL63 NOT DETECTED NOT DETECTED Final   Coronavirus OC43 NOT DETECTED NOT DETECTED Final   Metapneumovirus NOT DETECTED NOT DETECTED Final   Rhinovirus / Enterovirus NOT DETECTED NOT DETECTED Final   Influenza A NOT DETECTED NOT DETECTED Final   Influenza B NOT DETECTED NOT DETECTED Final   Parainfluenza Virus 1 NOT DETECTED NOT DETECTED Final   Parainfluenza Virus 2 NOT DETECTED NOT DETECTED Final   Parainfluenza Virus 3 NOT DETECTED NOT DETECTED Final   Parainfluenza Virus 4 NOT DETECTED NOT DETECTED Final   Respiratory Syncytial Virus NOT DETECTED NOT DETECTED Final   Bordetella pertussis NOT DETECTED NOT DETECTED Final   Bordetella Parapertussis NOT DETECTED NOT DETECTED Final   Chlamydophila pneumoniae NOT DETECTED NOT DETECTED Final   Mycoplasma pneumoniae NOT DETECTED NOT DETECTED Final    Comment: Performed at [facility], [address], [place], [place] [address]    Pertinent Imaging today Plain films and CT images have been personally visualized and interpreted; radiology reports have been reviewed. Decision making incorporated into the Impression  No results found.   I have personally spent 50 minutes involved in face-to-face and non-face-to-face activities for this patient on the day of the visit. Professional time spent includes the following activities: Preparing to see the patient (review of tests), Obtaining and/or reviewing separately obtained history (admission/discharge record), Performing a medically appropriate examination and/or evaluation , Ordering medications/tests/procedures, referring and communicating with other health care professionals, Documenting clinical information in the EMR, Independently interpreting results (not separately reported), Communicating results to the  patient/family/caregiver, Counseling and educating the patient/family/caregiver and Care coordination (not separately reported).   Plan d/w requesting provider as well  as ID pharm D  Note: This document was prepared using dragon voice recognition software and may include unintentional dictation errors.   Electronically signed by:   Odette Fraction, MD Infectious Disease Physician [facility] Pager: [phone]

## 2022-06-29 NOTE — Care Management Important Message (Signed)
Important Message  Patient Details IM Letter given. Name: [name] MRN: [id number] Date of Birth: Oct 17, 1960   Medicare Important Message Given:  Yes     Caren Macadam 06/29/2022, 12:51 PM

## 2022-06-29 NOTE — Progress Notes (Signed)
Report given to Angela,RN 3E.

## 2022-06-29 NOTE — Progress Notes (Signed)
Pt requesting bed alarm be turned off. This RN reminded patient that he had a previous fall this admission, and that he remains a high fall risk per protocol. Pt still wanting bed alarm off. Pt given RN direct number to call if pt feels dizzy, SOB, or unable to get up alone, and will provide assistance.

## 2022-06-30 ENCOUNTER — Ambulatory Visit
Admission: RE | Admit: 2022-06-30 | Discharge: 2022-06-30 | Disposition: A | Discharge status: Home / Self Care | Payer: Medicare Other | Source: Ambulatory Visit | Attending: Radiation Oncology | Admitting: Radiation Oncology

## 2022-06-30 ENCOUNTER — Ambulatory Visit: Payer: Medicare Other

## 2022-06-30 ENCOUNTER — Encounter: Payer: Self-pay | Admitting: Hematology and Oncology

## 2022-06-30 ENCOUNTER — Other Ambulatory Visit: Payer: Self-pay

## 2022-06-30 ENCOUNTER — Encounter: Payer: Self-pay | Admitting: Adult Health

## 2022-06-30 DIAGNOSIS — F17210 Nicotine dependence, cigarettes, uncomplicated: Secondary | ICD-10-CM | POA: Diagnosis not present

## 2022-06-30 DIAGNOSIS — B20 Human immunodeficiency virus [HIV] disease: Secondary | ICD-10-CM | POA: Diagnosis not present

## 2022-06-30 DIAGNOSIS — C090 Malignant neoplasm of tonsillar fossa: Secondary | ICD-10-CM

## 2022-06-30 DIAGNOSIS — J189 Pneumonia, unspecified organism: Secondary | ICD-10-CM | POA: Diagnosis not present

## 2022-06-30 LAB — COMPREHENSIVE METABOLIC PANEL
ALT: 22 U/L (ref 0–44)
AST: 22 U/L (ref 15–41)
Albumin: 2.5 g/dL — ABNORMAL LOW (ref 3.5–5.0)
Alkaline Phosphatase: 74 U/L (ref 38–126)
Anion gap: 7 (ref 5–15)
BUN: 11 mg/dL (ref 8–23)
CO2: 29 mmol/L (ref 22–32)
Calcium: 8.4 mg/dL — ABNORMAL LOW (ref 8.9–10.3)
Chloride: 99 mmol/L (ref 98–111)
Creatinine, Ser: 0.93 mg/dL (ref 0.61–1.24)
GFR, Estimated: >60 mL/min (ref >60)
Glucose, Bld: 117 mg/dL — ABNORMAL HIGH (ref 70–99)
Potassium: 3.6 mmol/L (ref 3.5–5.1)
Sodium: 135 mmol/L (ref 135–145)
Total Bilirubin: 0.5 mg/dL (ref 0.3–1.2)
Total Protein: 6.4 g/dL — ABNORMAL LOW (ref 6.5–8.1)

## 2022-06-30 LAB — RAD ONC ARIA SESSION SUMMARY
Course Elapsed Days: 53
Plan Fractions Treated to Date: 10
Plan Prescribed Dose Per Fraction: 2 Gy
Plan Total Fractions Prescribed: 13
Plan Total Prescribed Dose: 26 Gy
Reference Point Dosage Given to Date: 20 Gy
Reference Point Session Dosage Given: 2 Gy
Session Number: 32

## 2022-06-30 LAB — CBC
HCT: 27.4 % — ABNORMAL LOW (ref 39.0–52.0)
Hemoglobin: 8.4 g/dL — ABNORMAL LOW (ref 13.0–17.0)
MCH: 26.8 pg (ref 26.0–34.0)
MCHC: 30.7 g/dL (ref 30.0–36.0)
MCV: 87.5 fL (ref 80.0–100.0)
Platelets: 497 10*3/uL — ABNORMAL HIGH (ref 150–400)
RBC: 3.13 MIL/uL — ABNORMAL LOW (ref 4.22–5.81)
RDW: 19.1 % — ABNORMAL HIGH (ref 11.5–15.5)
WBC: 5.8 10*3/uL (ref 4.0–10.5)
nRBC: 0 % (ref 0.0–0.2)

## 2022-06-30 LAB — MAGNESIUM: Magnesium: 2.1 mg/dL (ref 1.7–2.4)

## 2022-06-30 LAB — CULTURE, BLOOD (ROUTINE X 2): Culture: NO GROWTH

## 2022-06-30 MED ORDER — AMOXICILLIN-POT CLAVULANATE 875-125 MG PO TABS: 1.0000 | ORAL_TABLET | Freq: Two times a day (BID) | ORAL | 0 refills | Status: AC

## 2022-06-30 MED ORDER — HEPARIN SOD (PORK) LOCK FLUSH 100 UNIT/ML IV SOLN
500.0000 [IU] | INTRAVENOUS | Status: AC | PRN
  Administered 2022-06-30: 500 [IU]
  Filled 2022-06-30: qty 5

## 2022-06-30 MED ORDER — RADIAPLEXRX EX GEL: Freq: Once | CUTANEOUS | Status: AC

## 2022-06-30 NOTE — Progress Notes (Signed)
Discharge instructions discussed with patient, verbalized agreement and understanding, medication and personal belongings returned to patient

## 2022-06-30 NOTE — Discharge Summary (Signed)
Physician Discharge Summary   [name] BJY:[id number] DOB: 11-15-1960 DOA: 06/24/2022  PCP: [name], No  Admit date: 06/24/2022 Discharge date: 06/30/2022   Admitted From: Home Disposition:  Home Discharging physician: [name] Chamber, MD Barriers to discharge: none  Recommendations at discharge: Follow up with ID 7/19 Repeat CT chest around September to follow up 8 mm atypical cyst in RUL Continue following with radiation oncology and oncology    Discharge Condition: stable CODE STATUS: Full Diet recommendation:  Diet Orders (From admission, onward)     Start     Ordered   06/30/22 0000  Diet general        06/30/22 1219   06/27/22 0816  Diet regular Room service appropriate? Yes; Fluid consistency: Thin  Diet effective now       Question Answer Comment  Room service appropriate? 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  Also has adenopathy consistent with nodal metastasis.  Also PMH of HIV. He states he has since quit smoking since his April hospitalization.  He has been undergoing outpatient radiation treatments.  He presented to the hospital due to generalized malaise, difficulty with swallowing at times, pleuritic chest pain, and cough. He underwent imaging with CT chest which showed multifocal peribronchovascular groundglass opacities throughout both lungs; most confluent at the lung bases.  He had also recently been started on Diflucan for presumed Candida. Infectious disease was consulted and he was started on antibiotics.  He was initiated on Unasyn which was transitioned to Augmentin to complete course at discharge.  Prior fluconazole course was also recommended to be completed.  Assessment and Plan: Multifocal pneumonia - B/L opacities noted on CT chest -RVP negative - ID also following; some risk for aspiration given  swallowing issues - abx transitioned to Unasyn today; plan is for Augmentin at discharge with completion date of 07/08/2022 - per CT chest: "Recommend follow-up CT in 3 months to assess for resolution given the presence of an 8 mm atypical cyst in the RIGHT upper lobe.    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 Currently not in chemotherapy due to neutropenia. Last chemo May 2024. - currently getting radiation    HIV on symtusa, last CD4 count 735 from February 2023.  ID following.   HTN - resume home regimen    Hyperlipidemia  - on Crestor   Anxiety  - on Wellbutrin   Etoh use - no s/s w/d - d/c ciwa   The patient's chronic medical conditions were treated accordingly per the patient's home medication regimen except as noted.  On day of discharge, patient was felt deemed stable for discharge. Patient/family member advised to call PCP or come back to ER if needed.   Principal Diagnosis: Multifocal pneumonia  Discharge Diagnoses: Active Hospital Problems   Diagnosis Date Noted   Multifocal pneumonia 06/26/2022    Priority: 1.   SCC (squamous cell carcinoma) 04/24/2022    Priority: 2.   Pressure injury of skin 06/28/2022   Shortness of breath 06/27/2022   Pneumonia of lower lobe due to infectious organism 06/25/2022   Alcohol dependence with alcohol-induced mood disorder (HCC)    Stenosis of cervical spine with myelopathy (HCC) 07/06/2017   Left-sided weakness 06/27/2017   Tricompartment  osteoarthritis of right knee 03/27/2017   Alcohol abuse with alcohol-induced mood disorder (HCC) 09/25/2016   Polysubstance dependence (HCC) 09/25/2016   Substance use disorder 09/19/2016   MDD (major depressive disorder), recurrent severe, without psychosis (HCC) 09/18/2016   Tobacco abuse 07/23/2015   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      Discharge Instructions     Diet general   Complete by: As directed    Increase activity slowly   Complete by: As directed    No wound care   Complete by: As directed       Allergies as of 06/30/2022       Reactions   Shellfish Allergy Anaphylaxis, Swelling   Swelling everywhere   Sulfa Antibiotics Anaphylaxis, Hives, Itching, Swelling   Swelling all over    Fish Allergy Swelling   Clindamycin Diarrhea        Medication List     TAKE these medications    amoxicillin-clavulanate 875-125 MG tablet Commonly known as: AUGMENTIN Take 1 tablet by mouth 2 (two) times daily for 8 days.   aprepitant 80 & 125 MG capsule Commonly known as: EMEND Take by mouth.   aspirin EC 81 MG tablet Take 81 mg by mouth daily.   buPROPion 300 MG 24 hr tablet Commonly known as: WELLBUTRIN XL Take 1 tablet (300 mg total) by mouth daily.   cloNIDine 0.1 MG tablet Commonly known as: CATAPRES Take 0.1 mg by mouth daily.   dexamethasone 4 MG tablet Commonly known as: DECADRON Take 2 tablets (8 mg) by mouth daily x 3 days starting the day after cisplatin chemotherapy. Take with food.   fluconazole 100 MG tablet Commonly known as: DIFLUCAN Take 2 tablets today, then 1 tablet daily x 20 more days. Hold Rosuvastatin while on this.   gabapentin 800 MG tablet Commonly known as: NEURONTIN Take 1 tablet (800 mg total) by mouth 3 (three) times daily.   lidocaine 2 % solution Commonly known as: XYLOCAINE Patient: Mix 1part 2% viscous lidocaine, 1part H20. Swish & swallow 10mL of diluted mixture, before meals and at bedtime, up to QID   lidocaine-prilocaine cream Commonly known as: EMLA Apply to affected area once   losartan 25 MG tablet Commonly known as: COZAAR Take 1 tablet (25 mg total) by mouth daily.   oxyCODONE 5 MG immediate release tablet Commonly known as: Oxy IR/ROXICODONE Take 1-2 tablets (5-10 mg total) by mouth every 4 (four) hours as needed for severe pain.    polyethylene glycol powder 17 GM/SCOOP powder Commonly known as: GlycoLax Take 1/2 to 1 capsule dissolved in liquid daily for bowel regulation while on pain medication   prochlorperazine 10 MG tablet Commonly known as: COMPAZINE Take 1 tablet (10 mg total) by mouth every 6 (six) hours as needed (Nausea or vomiting).   rosuvastatin 5 MG tablet Commonly known as: CRESTOR Take 5 mg by mouth daily.   Stool Softener/Laxative 8.6-50 MG tablet Generic drug: senna-docusate Take 1 tablet by mouth 2 (two) times daily.   Symtuza 800-150-200-10 MG Tabs Generic drug: Darunavir-Cobicistat-Emtricitabine-Tenofovir Alafenamide Take 1 tablet by mouth daily with breakfast.   valACYclovir 500 MG tablet Commonly known as: VALTREX Take 1 tablet (500 mg total) by mouth 2 (two) times daily.        Allergies  Allergen Reactions   Shellfish Allergy Anaphylaxis and Swelling    Swelling everywhere   Sulfa Antibiotics Anaphylaxis, Hives,  Itching and Swelling    Swelling all over    Fish Allergy Swelling   Clindamycin Diarrhea    Consultations:   Procedures:   Discharge Exam: BP 118/84 (BP Location: Right Arm)   Pulse 78   Temp (!) 97.5 F (36.4 C) (Oral)   Resp 18   Ht 6' (1.829 m)   Wt 62.5 kg   SpO2 100%   BMI 18.69 kg/m  Physical Exam Constitutional:      General: He is not in acute distress.    Appearance: Normal appearance.     Comments: Fatigued appearing and uncomfortable  HENT:     Head: Normocephalic and atraumatic.     Mouth/Throat:     Mouth: Mucous membranes are moist.  Eyes:     Extraocular Movements: Extraocular movements intact.  Neck:     Comments: R neck noted with lipoma (stable); left neck is mildly indurated c/w radiation treatments Cardiovascular:     Rate and Rhythm: Normal rate and regular rhythm.  Pulmonary:     Effort: Pulmonary effort is normal. No respiratory distress.     Breath sounds: Normal breath sounds. No wheezing.  Abdominal:      General: Bowel sounds are normal. There is no distension.     Palpations: Abdomen is soft.     Tenderness: There is no abdominal tenderness.  Musculoskeletal:        General: Normal range of motion.     Cervical back: Normal range of motion and neck supple.  Skin:    General: Skin is warm and dry.  Neurological:     General: No focal deficit present.  Psychiatric:        Mood and Affect: Mood normal.        Behavior: Behavior normal.      The results of significant diagnostics from this hospitalization (including imaging, microbiology, ancillary and laboratory) are listed below for reference.   Microbiology: Recent Results (from the past 240 hour(s))  Culture, blood (Routine X 2) w Reflex to ID Panel     Status: None   Collection Time: 06/25/22  1:29 AM   Specimen: BLOOD  Result Value Ref Range Status   Specimen Description   Final    BLOOD BLOOD LEFT ARM Performed at [facility], [address], [place], [place] [address]    Special Requests   Final    BOTTLES DRAWN AEROBIC AND ANAEROBIC Blood Culture adequate volume Performed at [facility], [address] [place], [place] [address]    Culture   Final    NO GROWTH 5 DAYS Performed at [facility], [address] [place], [place] [address]    Report Status 06/30/2022 FINAL  Final  Culture, blood (Routine X 2) w Reflex to ID Panel     Status: None   Collection Time: 06/25/22  1:29 AM   Specimen: BLOOD  Result Value Ref Range Status   Specimen Description   Final    BLOOD BLOOD LEFT HAND Performed at [facility], [address] [place], [place] [address]    Special Requests   Final    BOTTLES DRAWN AEROBIC ONLY Blood Culture adequate volume Performed at [facility], [address] [place], [place] [address]    Culture   Final    NO GROWTH 5 DAYS Performed at [facility], [address] [place], [place] [address]     Report Status 06/30/2022 FINAL  Final  Urine Culture (for pregnant, neutropenic or urologic patients or patients with an indwelling urinary catheter)     Status: Abnormal   Collection Time: 06/25/22  2:11 AM   Specimen: Urine, Clean Catch  Result Value Ref Range Status   Specimen Description   Final    URINE, CLEAN CATCH Performed at [facility], [address], [place], [place] [address]    Special Requests   Final    NONE Performed at [facility], [address] [place], [place] [address]    Culture MULTIPLE SPECIES PRESENT, SUGGEST RECOLLECTION (A)  Final   Report Status 06/26/2022 FINAL  Final  Culture, Respiratory w Gram Stain     Status: None   Collection Time: 06/26/22 11:55 AM   Specimen: Sputum; Respiratory  Result Value Ref Range Status   Specimen Description   Final    SPU Performed at [facility], [address] [place], [place] [address]    Special Requests   Final    Immunocompromised Performed at [facility], [address], [place], [place] [address]    Gram Stain   Final    ABUNDANT WBC PRESENT, PREDOMINANTLY PMN FEW SQUAMOUS EPITHELIAL CELLS PRESENT RARE GRAM POSITIVE RODS MODERATE YEAST    Culture   Final    FEW Normal respiratory flora-no Staph aureus or Pseudomonas seen Performed at [facility], [address], [place], [place] [address]    Report Status 06/28/2022 FINAL  Final  MRSA Next Gen by PCR, Nasal     Status: None   Collection Time: 06/26/22 11:55 AM   Specimen: Nasal Mucosa; Nasal Swab  Result Value Ref Range Status   MRSA by PCR Next Gen NOT DETECTED NOT DETECTED Final    Comment: (NOTE) The GeneXpert MRSA Assay (FDA approved for NASAL specimens only), is one component of a comprehensive MRSA colonization surveillance program. It is not intended to diagnose MRSA infection nor to guide or monitor treatment for MRSA infections. Test performance is not  FDA approved in patients less than 57 years old. Performed at [facility], [address], [place], [place] [address]   Respiratory (~20 pathogens) panel by PCR     Status: None   Collection Time: 06/28/22 11:55 AM   Specimen: Nasopharyngeal Swab; Respiratory  Result Value Ref Range Status   Adenovirus NOT DETECTED NOT DETECTED Final   Coronavirus 229E NOT DETECTED NOT DETECTED Final    Comment: (NOTE) The Coronavirus on the Respiratory Panel, DOES NOT test for the novel  Coronavirus (2019 nCoV)    Coronavirus HKU1 NOT DETECTED NOT DETECTED Final   Coronavirus NL63 NOT DETECTED NOT DETECTED Final   Coronavirus OC43 NOT DETECTED NOT DETECTED Final   Metapneumovirus NOT DETECTED NOT DETECTED Final   Rhinovirus / Enterovirus NOT DETECTED NOT DETECTED Final   Influenza A NOT DETECTED NOT DETECTED Final   Influenza B NOT DETECTED NOT DETECTED Final   Parainfluenza Virus 1 NOT DETECTED NOT DETECTED Final   Parainfluenza Virus 2 NOT DETECTED NOT DETECTED Final   Parainfluenza Virus 3 NOT DETECTED NOT DETECTED Final   Parainfluenza Virus 4 NOT DETECTED NOT DETECTED Final   Respiratory Syncytial Virus NOT DETECTED NOT DETECTED Final   Bordetella pertussis NOT DETECTED NOT DETECTED Final   Bordetella Parapertussis NOT DETECTED NOT DETECTED Final   Chlamydophila pneumoniae NOT DETECTED NOT DETECTED Final   Mycoplasma pneumoniae NOT DETECTED NOT DETECTED Final    Comment:  Performed at [facility], [address] [place], [place] [address]     Labs: BNP (last 3 results) Recent Labs    06/24/22 2055  BNP 84.1   Basic Metabolic Panel: Recent Labs  Lab 06/25/22 0129 06/27/22 0408 06/28/22 0321 06/29/22 0404 06/30/22 0330  NA 134* 135 134* 135 135  K 3.7 3.5 3.6 3.1* 3.6  CL 98 101 101 101 99  CO2 28 28 26 28 29   GLUCOSE 120* 144* 98 94 117*  BUN 16 15 16 14 11   CREATININE 0.95 0.76 0.89 1.04 0.93  CALCIUM 8.2* 8.0* 7.9* 7.8* 8.4*  MG  --   --   --   1.5* 2.1   Liver Function Tests: Recent Labs  Lab 06/27/22 0408 06/28/22 0321 06/29/22 0404 06/30/22 0330  AST 18 20 20 22   ALT 21 23 22 22   ALKPHOS 70 74 68 74  BILITOT 0.4 0.4 0.3 0.5  PROT 5.7* 5.9* 5.6* 6.4*  ALBUMIN 2.1* 2.2* 2.2* 2.5*   No results for input(s): "LIPASE", "AMYLASE" in the last 168 hours. No results for input(s): "AMMONIA" in the last 168 hours. CBC: Recent Labs  Lab 06/24/22 2054 06/24/22 2348 06/25/22 0129 06/27/22 0408 06/28/22 0321 06/29/22 0404 06/30/22 0330  WBC 2.4*   < > 3.4* 6.5 5.7 6.1 5.8  NEUTROABS 1.6*  --   --   --   --   --   --   HGB 8.7*   < > 8.3* 8.3* 7.9* 7.5* 8.4*  HCT 27.6*   < > 26.1* 26.4* 25.5* 24.1* 27.4*  MCV 84.9   < > 86.1 85.7 86.4 86.7 87.5  PLT 394   < > 393 527* 447* 415* 497*   < > = values in this interval not displayed.   Cardiac Enzymes: No results for input(s): "CKTOTAL", "CKMB", "CKMBINDEX", "TROPONINI" in the last 168 hours. BNP: Invalid input(s): "POCBNP" CBG: No results for input(s): "GLUCAP" in the last 168 hours. D-Dimer No results for input(s): "DDIMER" in the last 72 hours. Hgb A1c No results for input(s): "HGBA1C" in the last 72 hours. Lipid Profile No results for input(s): "CHOL", "HDL", "LDLCALC", "TRIG", "CHOLHDL", "LDLDIRECT" in the last 72 hours. Thyroid function studies No results for input(s): "TSH", "T4TOTAL", "T3FREE", "THYROIDAB" in the last 72 hours.  Invalid input(s): "FREET3" Anemia work up No results for input(s): "VITAMINB12", "FOLATE", "FERRITIN", "TIBC", "IRON", "RETICCTPCT" in the last 72 hours. Urinalysis    Component Value Date/Time   COLORURINE YELLOW 06/16/2022 1534   APPEARANCEUR CLEAR 06/16/2022 1534   LABSPEC 1.017 06/16/2022 1534   PHURINE 6.0 06/16/2022 1534   GLUCOSEU NEGATIVE 06/16/2022 1534   HGBUR NEGATIVE 06/16/2022 1534   BILIRUBINUR NEGATIVE 06/16/2022 1534   KETONESUR NEGATIVE 06/16/2022 1534   PROTEINUR NEGATIVE 06/16/2022 1534   UROBILINOGEN 0.2  05/08/2011 0915   NITRITE NEGATIVE 06/16/2022 1534   LEUKOCYTESUR NEGATIVE 06/16/2022 1534   Sepsis Labs Recent Labs  Lab 06/27/22 0408 06/28/22 0321 06/29/22 0404 06/30/22 0330  WBC 6.5 5.7 6.1 5.8   Microbiology Recent Results (from the past 240 hour(s))  Culture, blood (Routine X 2) w Reflex to ID Panel     Status: None   Collection Time: 06/25/22  1:29 AM   Specimen: BLOOD  Result Value Ref Range Status   Specimen Description   Final    BLOOD BLOOD LEFT ARM Performed at [facility], [address] [place], [place] [address]    Special Requests   Final  BOTTLES DRAWN AEROBIC AND ANAEROBIC Blood Culture adequate volume Performed at [facility], [address], [place], [place] [address]    Culture   Final    NO GROWTH 5 DAYS Performed at [facility], [address], [place], [place] [address]    Report Status 06/30/2022 FINAL  Final  Culture, blood (Routine X 2) w Reflex to ID Panel     Status: None   Collection Time: 06/25/22  1:29 AM   Specimen: BLOOD  Result Value Ref Range Status   Specimen Description   Final    BLOOD BLOOD LEFT HAND Performed at [facility], [address] [place], [place] [address]    Special Requests   Final    BOTTLES DRAWN AEROBIC ONLY Blood Culture adequate volume Performed at [facility], [address] [place], [place] [address]    Culture   Final    NO GROWTH 5 DAYS Performed at [facility], [address], [place], [place] [address]    Report Status 06/30/2022 FINAL  Final  Urine Culture (for pregnant, neutropenic or urologic patients or patients with an indwelling urinary catheter)     Status: Abnormal   Collection Time: 06/25/22  2:11 AM   Specimen: Urine, Clean Catch  Result Value Ref Range Status   Specimen Description   Final    URINE, CLEAN CATCH Performed at [facility], [address], [place],  [place] [address]    Special Requests   Final    NONE Performed at [facility], [address] [place], [place] [address]    Culture MULTIPLE SPECIES PRESENT, SUGGEST RECOLLECTION (A)  Final   Report Status 06/26/2022 FINAL  Final  Culture, Respiratory w Gram Stain     Status: None   Collection Time: 06/26/22 11:55 AM   Specimen: Sputum; Respiratory  Result Value Ref Range Status   Specimen Description   Final    SPU Performed at [facility], [address], [place], [place] [address]    Special Requests   Final    Immunocompromised Performed at [facility], [address] [place], [place] [address]    Gram Stain   Final    ABUNDANT WBC PRESENT, PREDOMINANTLY PMN FEW SQUAMOUS EPITHELIAL CELLS PRESENT RARE GRAM POSITIVE RODS MODERATE YEAST    Culture   Final    FEW Normal respiratory flora-no Staph aureus or Pseudomonas seen Performed at [facility], [address] [place], [place] [address]    Report Status 06/28/2022 FINAL  Final  MRSA Next Gen by PCR, Nasal     Status: None   Collection Time: 06/26/22 11:55 AM   Specimen: Nasal Mucosa; Nasal Swab  Result Value Ref Range Status   MRSA by PCR Next Gen NOT DETECTED NOT DETECTED Final    Comment: (NOTE) The GeneXpert MRSA Assay (FDA approved for NASAL specimens only), is one component of a comprehensive MRSA colonization surveillance program. It is not intended to diagnose MRSA infection nor to guide or monitor treatment for MRSA infections. Test performance is not FDA approved in patients less than 1 years old. Performed at [facility], [address], [place], [place] [address]   Respiratory (~20 pathogens) panel by PCR     Status: None   Collection Time: 06/28/22 11:55 AM   Specimen: Nasopharyngeal Swab; Respiratory  Result Value Ref Range Status   Adenovirus NOT DETECTED  NOT DETECTED Final   Coronavirus 229E NOT DETECTED NOT DETECTED  Final    Comment: (NOTE) The Coronavirus on the Respiratory Panel, DOES NOT test for the novel  Coronavirus (2019 nCoV)    Coronavirus HKU1 NOT DETECTED NOT DETECTED Final   Coronavirus NL63 NOT DETECTED NOT DETECTED Final   Coronavirus OC43 NOT DETECTED NOT DETECTED Final   Metapneumovirus NOT DETECTED NOT DETECTED Final   Rhinovirus / Enterovirus NOT DETECTED NOT DETECTED Final   Influenza A NOT DETECTED NOT DETECTED Final   Influenza B NOT DETECTED NOT DETECTED Final   Parainfluenza Virus 1 NOT DETECTED NOT DETECTED Final   Parainfluenza Virus 2 NOT DETECTED NOT DETECTED Final   Parainfluenza Virus 3 NOT DETECTED NOT DETECTED Final   Parainfluenza Virus 4 NOT DETECTED NOT DETECTED Final   Respiratory Syncytial Virus NOT DETECTED NOT DETECTED Final   Bordetella pertussis NOT DETECTED NOT DETECTED Final   Bordetella Parapertussis NOT DETECTED NOT DETECTED Final   Chlamydophila pneumoniae NOT DETECTED NOT DETECTED Final   Mycoplasma pneumoniae NOT DETECTED NOT DETECTED Final    Comment: Performed at [facility], [address], [place], [place] [address]    Procedures/Studies: CT CHEST WO CONTRAST  Result Date: 06/25/2022 CLINICAL DATA:  Chest pain, nonspecific sob * Tracking Code: BO * history of tonsillar cancer EXAM: CT CHEST WITHOUT CONTRAST TECHNIQUE: Multidetector CT imaging of the chest was performed following the standard protocol without IV contrast. RADIATION DOSE REDUCTION: This exam was performed according to the departmental dose-optimization program which includes automated exposure control, adjustment of the mA and/or kV according to patient size and/or use of iterative reconstruction technique. COMPARISON:  April 27, 2022 FINDINGS: Cardiovascular: Heart is normal in size. Trace pericardial fluid. RIGHT chest port with tip terminating at the superior cavoatrial junction. Predominately LEFT-sided coronary artery atherosclerotic calcifications. Scattered atherosclerotic  calcifications of the nonaneurysmal thoracic aorta. Mediastinum/Nodes: Visualized thyroid is unremarkable. No axillary adenopathy. Mildly enlarged precarinal mediastinal lymph node measures 11 mm in the short axis (series 2, image 74). Lungs/Pleura: No pleural effusion or pneumothorax. Moderate centrilobular emphysema. Peribronchovascular ground-glass opacities throughout the RIGHT upper lobe, RIGHT middle lobe and bilateral lower lobes. This is most confluent at the lung bases. Mild bronchial wall thickening. Endobronchial masslike area within the LEFT mainstem bronchus measuring approximately 16 x 19 mm (series 7, image 57; series 2, image 81)). This was not FDG avid on recent PET CT. This is favored to subtly have been present on prior CT in 2009 with minimal increase in size since then suggesting underlying benign etiology. It moderately narrows the bronchus. Atypical RIGHT apical cyst with a thick rind measuring 8 by 5 mm (series 2, image 54). LEFT upper lobe pulmonary nodule measures 5 mm (series 2, image 62). Upper Abdomen: No acute abnormality. Musculoskeletal: No acute osseous abnormality. IMPRESSION: 1. Multifocal peribronchovascular ground-glass opacities throughout the RIGHT upper lobe, RIGHT middle lobe and bilateral lower lobes. This is most confluent at the lung bases. Findings are favored to reflect multifocal infection. Recommend follow-up CT in 3 months to assess for resolution given the presence of an 8 mm atypical cyst in the RIGHT upper lobe. 2. Endobronchial masslike area within the LEFT mainstem bronchus measuring up to 19 mm. This area was not FDG avid on recent PET CT. This is favored to subtly have been present on prior CT in 2009 with minimal increase in size since then suggesting underlying benign etiology. This may reflect a benign etiology such as a leiomyoma  or polyp. Further evaluation with dedicated bronchoscopy and tissue sampling could be considered given moderate narrowing of LEFT  mainstem bronchus due to this mass. Aortic Atherosclerosis (ICD10-I70.0) and Emphysema (ICD10-J43.9). Electronically Signed   By: Meda Klinefelter M.D.   On: 06/25/2022 15:23   DG Chest 2 View  Result Date: 06/24/2022 CLINICAL DATA:  Shortness of breath two days EXAM: CHEST - 2 VIEW COMPARISON:  04/22/2022 FINDINGS: Cardiac shadow is within normal limits. Right chest wall port is again seen. Calcified loose body is noted about the right shoulder joint stable from the prior study. New left basilar atelectasis is seen without sizable effusion. IMPRESSION: Left basilar atelectasis. Electronically Signed   By: Alcide Clever M.D.   On: 06/24/2022 21:09   VAS [place] LOWER EXTREMITY VENOUS (DVT) (7a-7p)  Result Date: 06/21/2022  Lower Venous DVT Study Patient Name:  PANTELIS ELGERSMA  Date of Exam:   06/21/2022 Medical Rec #: [id number]       Accession #:    [id number] Date of Birth: 06-22-1960       Patient Gender: M Patient Age:   73 years Exam Location:  [facility] Procedure:      VAS [place] LOWER EXTREMITY VENOUS (DVT) Referring Phys: [name] --------------------------------------------------------------------------------  Indications: Swelling.  Comparison Study: Previous exam 06/12/22 was negative for DVT Performing Technologist: Ernestene Mention RVT, RDMS  Examination Guidelines: A complete evaluation includes B-mode imaging, spectral Doppler, color Doppler, and power Doppler as needed of all accessible portions of each vessel. Bilateral testing is considered an integral part of a complete examination. Limited examinations for reoccurring indications may be performed as noted. The reflux portion of the exam is performed with the patient in reverse Trendelenburg.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

## 2022-06-30 NOTE — Progress Notes (Signed)
[facility] Infectious Diseases Follow Up Note  Patient Identification: Patient Name: [name] MRN: [id number] Admit Date: 06/24/2022  7:47 PM Age: 62 y.o.Today's Date: 06/30/2022  Reason for Visit: Multifocal pneumonia  Principal Problem:   Multifocal pneumonia Active Problems:   HIV disease (HCC)   Anal condyloma   MDD (major depressive disorder), recurrent severe, without psychosis (HCC)   Substance use disorder   Alcohol abuse with alcohol-induced mood disorder (HCC)   Polysubstance dependence (HCC)   Tricompartment osteoarthritis of right knee   Left-sided weakness   Stenosis of cervical spine with myelopathy (HCC)   Alcohol dependence with alcohol-induced mood disorder (HCC)   Tobacco abuse   SCC (squamous cell carcinoma)   Pneumonia of lower lobe due to infectious organism   Shortness of breath   Pressure injury of skin  Antibiotics:  Doxycycline 6/15 Cefepime 6/15-6/19 Metronidazole 6/17-6/19 Ceftriaxone 6/15 Unasyn 6/19-c   Symtuza 6/16-c   Lines/Hardware: RT chest port     Interval Events: Afebrile CBC and CMP reviewed  Remains on room air  Assessment 62 year old male with PMH of HIV well controlled on Symtuza, anal condyloma,cervical stenosis w myelopathy, depression, substance abuse, 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   # Multifocal pna Atypical cyst in RUL Endobronchial mass in left main stem bronchus, considered benign etiology  Suspect most likely 2/2 aspiration in the setting of some dysphagia and throat ca 6/17 respiratory cx normal respiratory flora 6/19 RVP negative Clinically improving   # HIV  Well controlled on symtuza, not on bactrim ppx as CD4  count mostly over 200  # Thrush ? Esophageal candidiasis  Appears patient was started on Fluconazole on 5/31 for 3 weeks course  On Fluconazole   Recommendations Continue IV Unasyn IP. Suspect source of PNA aspiration related given h/o oropharyngeal ca and some difficulty in swallowing. Low suspicion for OIS as HIV is well controlled and neutropenia is resolved. Plan for augmentin at discharge. EOT 6/29. He needs chest PT to help clear his secretions  Complete 3 weeks course of fluconazole Plan to follow-up CT chest in 3 months to assess for resolution of abnormalities given the presence of an 8 mm atypical cyst in the RIGHT upper lobe. Patient has an appt with Dr Drue Second on 7/19 at 2: 15 pm ID will so, recall if needed    Rest of the management as per the primary team. Thank you for the consult. Please page with pertinent questions or concerns.  ______________________________________________________________________ Subjective Seen and examined at bedside. Feels better, Breathing is better. Cough is less frequent and phlegm production also improving  Vitals BP 118/84 (BP Location: Right Arm)   Pulse 78   Temp (!) 97.5 F (36.4 C) (Oral)   Resp 18   Ht 6' (1.829 m)   Wt 62.5 kg   SpO2 100%   BMI 18.69 kg/m   Exam  Adult male sitting in the bed and appears comfortable  Able to speak in full sentences  Chest - bilateral air entry with occasional crepts CVS - regular rate and rhythm Abdomen - soft and non distended  Ext - no pedal edema Skin - rt chest port with no concerns    Pertinent labs     Latest Ref Rng &  Units 06/30/2022    3:30 AM 06/29/2022    4:04 AM 06/28/2022    3:21 AM  CBC  WBC 4.0 - 10.5 K/uL 5.8  6.1  5.7   Hemoglobin 13.0 - 17.0 g/dL 8.4  7.5  7.9   Hematocrit 39.0 - 52.0 % 27.4  24.1  25.5   Platelets 150 - 400 K/uL 497  415  447       Latest Ref Rng & Units 06/30/2022    3:30 AM 06/29/2022    4:04 AM 06/28/2022    3:21 AM  CMP  Glucose 70 - 99  mg/dL 478  94  98   BUN 8 - 23 mg/dL 11  14  16    Creatinine 0.61 - 1.24 mg/dL 2.95  6.21  3.08   Sodium 135 - 145 mmol/L 135  135  134   Potassium 3.5 - 5.1 mmol/L 3.6  3.1  3.6   Chloride 98 - 111 mmol/L 99  101  101   CO2 22 - 32 mmol/L 29  28  26    Calcium 8.9 - 10.3 mg/dL 8.4  7.8  7.9   Total Protein 6.5 - 8.1 g/dL 6.4  5.6  5.9   Total Bilirubin 0.3 - 1.2 mg/dL 0.5  0.3  0.4   Alkaline Phos 38 - 126 U/L 74  68  74   AST 15 - 41 U/L 22  20  20    ALT 0 - 44 U/L 22  22  23     Pertinent Microbiology Results for orders placed or performed during the hospital encounter of 06/24/22  Culture, blood (Routine X 2) w Reflex to ID Panel     Status: None   Collection Time: 06/25/22  1:29 AM   Specimen: BLOOD  Result Value Ref Range Status   Specimen Description   Final    BLOOD BLOOD LEFT ARM Performed at [facility] [address], [place], [place] [address]    Special Requests   Final    BOTTLES DRAWN AEROBIC AND ANAEROBIC Blood Culture adequate volume Performed at [facility], [address] [place], [place] [address]    Culture   Final    NO GROWTH 5 DAYS Performed at [facility], [address], [place], [place] [address]    Report Status 06/30/2022 FINAL  Final  Culture, blood (Routine X 2) w Reflex to ID Panel     Status: None   Collection Time: 06/25/22  1:29 AM   Specimen: BLOOD  Result Value Ref Range Status   Specimen Description   Final    BLOOD BLOOD LEFT HAND Performed at [facility], [address] [place], [place] [address]    Special Requests   Final    BOTTLES DRAWN AEROBIC ONLY Blood Culture adequate volume Performed at [facility], [address] [place], [place] [address]    Culture   Final    NO GROWTH 5 DAYS Performed at [facility], [address], [place], [place] [address]    Report Status 06/30/2022 FINAL  Final  Urine Culture (for pregnant,  neutropenic or urologic patients or patients with an indwelling urinary catheter)     Status: Abnormal   Collection Time: 06/25/22  2:11 AM   Specimen: Urine, Clean Catch  Result Value Ref Range Status   Specimen Description   Final    URINE, CLEAN CATCH Performed at [facility], [address], [place], [place] [address]    Special Requests   Final    NONE Performed at [facility], [address], [place], [place] [address]    Culture MULTIPLE SPECIES PRESENT, SUGGEST RECOLLECTION (A)  Final   Report Status 06/26/2022 FINAL  Final  Culture, Respiratory w Gram Stain     Status: None   Collection Time: 06/26/22 11:55 AM   Specimen: Sputum; Respiratory  Result Value Ref Range Status   Specimen Description   Final    SPU Performed at [facility], [address], [place], [place] [address]    Special Requests   Final    Immunocompromised Performed at [facility], [address], [place], [place] [address]    Gram Stain   Final    ABUNDANT WBC PRESENT, PREDOMINANTLY PMN FEW SQUAMOUS EPITHELIAL CELLS PRESENT RARE GRAM POSITIVE RODS MODERATE YEAST    Culture   Final    FEW Normal respiratory flora-no Staph aureus or Pseudomonas seen Performed at [facility], [address], [place], [place] [address]    Report Status 06/28/2022 FINAL  Final  MRSA Next Gen by PCR, Nasal     Status: None   Collection Time: 06/26/22 11:55 AM   Specimen: Nasal Mucosa; Nasal Swab  Result Value Ref Range Status   MRSA by PCR Next Gen NOT DETECTED NOT DETECTED Final    Comment: (NOTE) The GeneXpert MRSA Assay (FDA approved for NASAL specimens only), is one component of a comprehensive MRSA colonization surveillance program. It is not intended to diagnose MRSA infection nor to guide or monitor treatment for MRSA infections. Test performance is not FDA approved in patients less than 85 years old. Performed at [facility], [address] [place], [place] [address]   Respiratory (~20 pathogens) panel by PCR     Status: None   Collection Time: 06/28/22 11:55 AM   Specimen: Nasopharyngeal Swab; Respiratory  Result Value Ref Range Status   Adenovirus NOT DETECTED NOT DETECTED Final   Coronavirus 229E NOT DETECTED NOT DETECTED Final    Comment: (NOTE) The Coronavirus on the Respiratory Panel, DOES NOT test for the novel  Coronavirus (2019 nCoV)    Coronavirus HKU1 NOT DETECTED NOT DETECTED Final   Coronavirus NL63 NOT DETECTED NOT DETECTED Final   Coronavirus OC43 NOT DETECTED NOT DETECTED Final   Metapneumovirus NOT DETECTED NOT DETECTED Final   Rhinovirus / Enterovirus NOT DETECTED NOT DETECTED Final   Influenza A NOT DETECTED NOT DETECTED Final   Influenza B NOT DETECTED NOT DETECTED Final   Parainfluenza Virus 1 NOT DETECTED NOT DETECTED Final   Parainfluenza Virus 2 NOT DETECTED NOT DETECTED Final   Parainfluenza Virus 3 NOT DETECTED NOT DETECTED Final   Parainfluenza Virus 4 NOT DETECTED NOT DETECTED Final   Respiratory Syncytial Virus NOT DETECTED NOT DETECTED Final   Bordetella pertussis NOT DETECTED NOT DETECTED Final   Bordetella Parapertussis NOT DETECTED NOT DETECTED Final   Chlamydophila pneumoniae NOT DETECTED NOT DETECTED Final   Mycoplasma pneumoniae NOT DETECTED NOT DETECTED Final    Comment: Performed at [facility], [address] [place], [place] [address]    Pertinent Imaging today Plain films and CT images have been personally visualized and interpreted; radiology reports have been reviewed. Decision making incorporated into the Impression  No results found.   I have personally spent 50 minutes involved in face-to-face and non-face-to-face activities for this patient on the  day of the visit. Professional time spent includes the following activities: Preparing to see the patient (review of tests), Obtaining and/or reviewing separately obtained  history (admission/discharge record), Performing a medically appropriate examination and/or evaluation , Ordering medications/tests/procedures, referring and communicating with other health care professionals, Documenting clinical information in the EMR, Independently interpreting results (not separately reported), Communicating results to the patient/family/caregiver, Counseling and educating the patient/family/caregiver and Care coordination (not separately reported).   Plan d/w requesting provider as well as ID pharm D  Note: This document was prepared using dragon voice recognition software and may include unintentional dictation errors.   Electronically signed by:   Odette Fraction, MD Infectious Disease Physician [facility] for Infectious Disease Pager: [phone]

## 2022-07-03 ENCOUNTER — Ambulatory Visit: Payer: Medicare Other

## 2022-07-03 ENCOUNTER — Telehealth: Payer: Self-pay

## 2022-07-03 NOTE — Telephone Encounter (Signed)
Rn called pt sister when it was discovered that the pt was admitted at [facility] in [place].  According to his sister the pt had been too weak and not feeling and short of breath at home. She also reported that pt had swollen feet as well. She reported that she went to the laundry mat and came home and found him "slumped over" with rolled eyes. She called 911 and they took him to the hospital. She also reported to Rn that the Md in [place] was concerned for Aspiration pna as well based on x rays. He is now requiring thickener for his liquids for this concern. Rn educated family to let us know when he is discharged and we can evaluate for future radiation. He has four treatments left. Pt family was grateful for the phone call and check up.

## 2022-07-04 ENCOUNTER — Telehealth: Payer: Self-pay

## 2022-07-04 ENCOUNTER — Ambulatory Visit: Payer: Medicare Other

## 2022-07-04 NOTE — Telephone Encounter (Signed)
Rn called pt sister to check on him and how he is doing in the hospital. Pt sister stated that he is doing ok overall. His feet remain swollen and they are running test to see what is going on there. Also he is able to eat a small amount now, which is in an improvement. Family is concerned about him being able to come back to [place] to finish treatment. Rn encouraged family to focus on his current healing and we would discuss radiation plans after he is discharged from the hospital.

## 2022-07-05 ENCOUNTER — Ambulatory Visit: Payer: Medicare Other

## 2022-07-06 ENCOUNTER — Ambulatory Visit: Payer: Medicare Other

## 2022-07-07 ENCOUNTER — Ambulatory Visit: Payer: Medicare Other

## 2022-07-07 ENCOUNTER — Telehealth: Payer: Self-pay

## 2022-07-07 NOTE — Telephone Encounter (Signed)
Rn called to pt sister to see how is was doing in the hospital in [place]. She reported he is improving daily and eating more daily as well. He remains in the hospital while they are working on him gaining his strength and increasing nutrition. Rn offered our support if anything is needed from [place].

## 2022-07-10 ENCOUNTER — Telehealth: Payer: Self-pay

## 2022-07-10 ENCOUNTER — Ambulatory Visit: Payer: Medicare Other

## 2022-07-10 NOTE — Telephone Encounter (Signed)
Rn called pt sister to check on him. He remains in the hospital in [place]. According to his sister he is getting stronger daily and increasing his food intake well (he continues to not want a feeding tube). She also reports he will need rehab for a couple of weeks after d/c to get stronger. Finally she reports he will also need a hip replacement at some point due to his many falls lately. Rn sent an inbasket to Dr. Basilio Cairo and [facility] with this information. Rn will let pt sister know about finishing treatment.

## 2022-07-10 NOTE — Telephone Encounter (Signed)
Pt wanted Dr. Basilio Cairo and Hedda Slade to know he is feeling so much better. He was in good spirits on this phone call and sounded very happy. He reported he is eating well "I ate two portions of pot roast"  he said.He was so proud of how he was eating.  He said is feeling stronger everyday. He was also glad to hear that his cancer should be cured with his treatment. He was grateful for the care in which we provided him and wanted to give everyone his love. He said the support he felt meant so much to him.

## 2022-07-10 NOTE — Telephone Encounter (Signed)
Rn called sister back with an update on the timeline for radiation treatment (based on conversation with Dr. Basilio Cairo). When talking with pt sister she stated that the attending Md today stated he could finish his radiation treatments while in the hospital. She did however want to know if these radiation treatments would cure the patient and how they would know if it is cured. Rn educated pt sister that scans would be done in the future to look at disease progression and this would tell us how he is doing overall with disease progression. Pt sister would like to hear from Dr. Basilio Cairo or Rn about the timeline of more scans and also about the overall prognosis. Rn will send this updated information to Dr. Basilio Cairo for review.

## 2022-07-10 NOTE — Telephone Encounter (Signed)
Rn called pt sister to let her know that the radiation treatment received will hopefully cure his cancer. She was pleased with this news. Rn also informed her that the would get a pet scan after three months of completion of radiation therapy to monitor for disease progression. Rn asked that the pt sister email her when pt has finished his last radiation treatment (this should be finished in [place]), according to pt sister. Pt sister stated she would do so. Rn encouraged family to reach out with any needs or questions.

## 2022-07-11 ENCOUNTER — Other Ambulatory Visit: Payer: Self-pay

## 2022-07-11 ENCOUNTER — Ambulatory Visit: Payer: Medicare Other

## 2022-07-12 ENCOUNTER — Ambulatory Visit: Payer: Medicare Other

## 2022-07-13 ENCOUNTER — Ambulatory Visit: Payer: Medicare Other

## 2022-07-14 ENCOUNTER — Ambulatory Visit: Payer: Medicare Other

## 2022-07-14 NOTE — Progress Notes (Signed)
Oncology Nurse Navigator Documentation   I have spoken with Mr. Risenhoover sister Ahyan Mosbrucker twice today on the telephone. I have explained to her that Dr. Basilio Cairo recommends that he complete his radiation treatment in [facility] as he is scheduled for 4 more treatments next week starting 7/8 at 11:15 am. Mr. Aldred is scheduled for discharge today from [facility] in [place] and she plans to call him and let him know Dr. Colletta Maryland recommendations but is concerned about how he will travel to [place] to complete his treatment. I also explained to her that he will need oncology follow up either here in [place] or in [place] if he moves there permanently. She voiced her understanding of the above and will call me after she has spoken with Mr. Marik regarding his decision to complete radiation. She knows to leave me a VM if she does not talk to me directly.   Hedda Slade RN, BSN, OCN Head & Neck Oncology Nurse Navigator [facility] Phone # [phone]  Fax # [phone]

## 2022-07-17 ENCOUNTER — Ambulatory Visit: Payer: Medicare Other

## 2022-07-17 ENCOUNTER — Ambulatory Visit: Payer: Medicare Other | Admitting: Physical Therapy

## 2022-07-17 NOTE — Progress Notes (Signed)
Oncology Nurse Navigator Documentation   [name] called me this morning to inquire about his last radiation appointments. I explained that he was scheduled today through Thursday and then he would be done with his treatments. At this time he is in [place] staying with his sister after his recent hospitalization at a [facility] area hospital. He is unable to make his treatment today but plans to ride the train to [facility] tomorrow and the rest of this week to complete his treatments. He has asked me to contact the transportation coordinator to have somebody pick him up at the train station at 12:30 tomorrow which I have done. I have also notified the [facility] that he will not be here for treatment today and will arrive later than his scheduled 11:15 appointment tomorrow. He has my direct number to call if I can do anything else to help him.   Hedda Slade RN, BSN, OCN Head & Neck Oncology Nurse Navigator [facility] at [facility] Phone # [phone]  Fax # [phone]

## 2022-07-18 ENCOUNTER — Other Ambulatory Visit: Payer: Self-pay | Admitting: Adult Health

## 2022-07-18 ENCOUNTER — Ambulatory Visit: Payer: Medicare Other

## 2022-07-18 ENCOUNTER — Inpatient Hospital Stay: Payer: Medicare Other | Admitting: Internal Medicine

## 2022-07-18 ENCOUNTER — Ambulatory Visit
Admission: RE | Admit: 2022-07-18 | Discharge: 2022-07-18 | Disposition: A | Discharge status: Home / Self Care | Payer: Medicare Other | Source: Ambulatory Visit | Attending: Radiation Oncology | Admitting: Radiation Oncology

## 2022-07-18 ENCOUNTER — Other Ambulatory Visit: Payer: Self-pay

## 2022-07-18 DIAGNOSIS — C090 Malignant neoplasm of tonsillar fossa: Secondary | ICD-10-CM

## 2022-07-18 DIAGNOSIS — E860 Dehydration: Secondary | ICD-10-CM | POA: Insufficient documentation

## 2022-07-18 DIAGNOSIS — K5903 Drug induced constipation: Secondary | ICD-10-CM | POA: Insufficient documentation

## 2022-07-18 DIAGNOSIS — R1310 Dysphagia, unspecified: Secondary | ICD-10-CM | POA: Insufficient documentation

## 2022-07-18 DIAGNOSIS — Z510 Encounter for antineoplastic radiation therapy: Secondary | ICD-10-CM | POA: Diagnosis not present

## 2022-07-18 DIAGNOSIS — D72819 Decreased white blood cell count, unspecified: Secondary | ICD-10-CM | POA: Insufficient documentation

## 2022-07-18 DIAGNOSIS — D649 Anemia, unspecified: Secondary | ICD-10-CM | POA: Insufficient documentation

## 2022-07-18 DIAGNOSIS — G893 Neoplasm related pain (acute) (chronic): Secondary | ICD-10-CM | POA: Insufficient documentation

## 2022-07-18 DIAGNOSIS — M7989 Other specified soft tissue disorders: Secondary | ICD-10-CM | POA: Insufficient documentation

## 2022-07-18 DIAGNOSIS — R339 Retention of urine, unspecified: Secondary | ICD-10-CM | POA: Insufficient documentation

## 2022-07-18 DIAGNOSIS — F17210 Nicotine dependence, cigarettes, uncomplicated: Secondary | ICD-10-CM | POA: Insufficient documentation

## 2022-07-18 DIAGNOSIS — C4492 Squamous cell carcinoma of skin, unspecified: Secondary | ICD-10-CM

## 2022-07-18 DIAGNOSIS — C760 Malignant neoplasm of head, face and neck: Secondary | ICD-10-CM

## 2022-07-18 LAB — RAD ONC ARIA SESSION SUMMARY
Course Elapsed Days: 71
Plan Fractions Treated to Date: 1
Plan Prescribed Dose Per Fraction: 2 Gy
Plan Total Fractions Prescribed: 4
Plan Total Prescribed Dose: 8 Gy
Reference Point Dosage Given to Date: 22 Gy
Reference Point Session Dosage Given: 2 Gy
Session Number: 33

## 2022-07-18 MED ORDER — OXYCODONE HCL 5 MG PO TABS
5.0000 mg | ORAL_TABLET | ORAL | 0 refills | Status: DC | PRN
Start: 2022-07-18 — End: 2022-07-20

## 2022-07-18 NOTE — Progress Notes (Signed)
Refill of Oxycodone #10 sent to pharmacy requested.  I will need to see the patient for f/u prior to further refills.

## 2022-07-19 ENCOUNTER — Ambulatory Visit
Admission: RE | Admit: 2022-07-19 | Discharge: 2022-07-19 | Disposition: A | Discharge status: Home / Self Care | Payer: Medicare Other | Source: Ambulatory Visit | Attending: Radiation Oncology | Admitting: Radiation Oncology

## 2022-07-19 ENCOUNTER — Telehealth: Payer: Self-pay | Admitting: *Deleted

## 2022-07-19 ENCOUNTER — Ambulatory Visit: Payer: Medicare Other

## 2022-07-19 ENCOUNTER — Other Ambulatory Visit: Payer: Self-pay

## 2022-07-19 DIAGNOSIS — Z510 Encounter for antineoplastic radiation therapy: Secondary | ICD-10-CM | POA: Diagnosis not present

## 2022-07-19 LAB — RAD ONC ARIA SESSION SUMMARY
Course Elapsed Days: 72
Plan Fractions Treated to Date: 2
Plan Prescribed Dose Per Fraction: 2 Gy
Plan Total Fractions Prescribed: 4
Plan Total Prescribed Dose: 8 Gy
Reference Point Dosage Given to Date: 24 Gy
Reference Point Session Dosage Given: 2 Gy
Session Number: 34

## 2022-07-19 NOTE — Telephone Encounter (Signed)
Pt called requesting refill for oxycodone. Per Lillard Anes, DNP notes, pt would need appt in office before refill is initiated. Pt agreed to come in for office visit. Pt notified of appt and verbalized understanding.

## 2022-07-20 ENCOUNTER — Ambulatory Visit: Payer: Medicare Other

## 2022-07-20 ENCOUNTER — Ambulatory Visit
Admission: RE | Admit: 2022-07-20 | Discharge: 2022-07-20 | Disposition: A | Discharge status: Home / Self Care | Payer: Medicare Other | Source: Ambulatory Visit | Attending: Radiation Oncology | Admitting: Radiation Oncology

## 2022-07-20 ENCOUNTER — Other Ambulatory Visit: Payer: Self-pay | Admitting: *Deleted

## 2022-07-20 ENCOUNTER — Encounter: Payer: Self-pay | Admitting: Adult Health

## 2022-07-20 ENCOUNTER — Inpatient Hospital Stay: Payer: Medicare Other

## 2022-07-20 ENCOUNTER — Other Ambulatory Visit: Payer: Self-pay

## 2022-07-20 ENCOUNTER — Inpatient Hospital Stay: Payer: Medicare Other | Attending: Licensed Clinical Social Worker | Admitting: Adult Health

## 2022-07-20 DIAGNOSIS — C090 Malignant neoplasm of tonsillar fossa: Secondary | ICD-10-CM

## 2022-07-20 DIAGNOSIS — Z510 Encounter for antineoplastic radiation therapy: Secondary | ICD-10-CM | POA: Diagnosis not present

## 2022-07-20 DIAGNOSIS — C760 Malignant neoplasm of head, face and neck: Secondary | ICD-10-CM

## 2022-07-20 DIAGNOSIS — G893 Neoplasm related pain (acute) (chronic): Secondary | ICD-10-CM

## 2022-07-20 LAB — RAD ONC ARIA SESSION SUMMARY
Course Elapsed Days: 73
Plan Fractions Treated to Date: 3
Plan Prescribed Dose Per Fraction: 2 Gy
Plan Total Fractions Prescribed: 4
Plan Total Prescribed Dose: 8 Gy
Reference Point Dosage Given to Date: 26 Gy
Reference Point Session Dosage Given: 2 Gy
Session Number: 35

## 2022-07-20 LAB — CBC WITH DIFFERENTIAL (CANCER CENTER ONLY)
Abs Immature Granulocytes: 0.05 10*3/uL (ref 0.00–0.07)
Basophils Absolute: 0 10*3/uL (ref 0.0–0.1)
Basophils Relative: 1 %
Eosinophils Absolute: 0.1 10*3/uL (ref 0.0–0.5)
Eosinophils Relative: 2 %
HCT: 25.8 % — ABNORMAL LOW (ref 39.0–52.0)
Hemoglobin: 8 g/dL — ABNORMAL LOW (ref 13.0–17.0)
Immature Granulocytes: 1 %
Lymphocytes Relative: 13 %
Lymphs Abs: 0.6 10*3/uL — ABNORMAL LOW (ref 0.7–4.0)
MCH: 27.4 pg (ref 26.0–34.0)
MCHC: 31 g/dL (ref 30.0–36.0)
MCV: 88.4 fL (ref 80.0–100.0)
Monocytes Absolute: 0.6 10*3/uL (ref 0.1–1.0)
Monocytes Relative: 13 %
Neutro Abs: 3.5 10*3/uL (ref 1.7–7.7)
Neutrophils Relative %: 70 %
Platelet Count: 303 10*3/uL (ref 150–400)
RBC: 2.92 MIL/uL — ABNORMAL LOW (ref 4.22–5.81)
RDW: 20.6 % — ABNORMAL HIGH (ref 11.5–15.5)
WBC Count: 4.9 10*3/uL (ref 4.0–10.5)
nRBC: 0 % (ref 0.0–0.2)

## 2022-07-20 LAB — BASIC METABOLIC PANEL - CANCER CENTER ONLY
Anion gap: 5 (ref 5–15)
BUN: 13 mg/dL (ref 8–23)
CO2: 30 mmol/L (ref 22–32)
Calcium: 8.8 mg/dL — ABNORMAL LOW (ref 8.9–10.3)
Chloride: 103 mmol/L (ref 98–111)
Creatinine: 0.82 mg/dL (ref 0.61–1.24)
GFR, Estimated: >60 mL/min (ref >60)
Glucose, Bld: 62 mg/dL — ABNORMAL LOW (ref 70–99)
Potassium: 3.5 mmol/L (ref 3.5–5.1)
Sodium: 138 mmol/L (ref 135–145)

## 2022-07-20 LAB — MAGNESIUM: Magnesium: 1.8 mg/dL (ref 1.7–2.4)

## 2022-07-20 MED ORDER — VALACYCLOVIR HCL 500 MG PO TABS
500.0000 mg | ORAL_TABLET | Freq: Two times a day (BID) | ORAL | 0 refills | Status: DC
Start: 2022-07-20 — End: 2022-08-03

## 2022-07-20 MED ORDER — OXYCODONE HCL 5 MG PO TABS
5.0000 mg | ORAL_TABLET | ORAL | 0 refills | Status: DC | PRN
Start: 2022-07-20 — End: 2022-07-20

## 2022-07-20 MED ORDER — OXYCODONE HCL 5 MG PO TABS
5.0000 mg | ORAL_TABLET | ORAL | 0 refills | Status: DC | PRN
Start: 2022-07-20 — End: 2022-07-28

## 2022-07-20 NOTE — Addendum Note (Signed)
Addended by: Noreene Filbert on: 07/20/2022 03:59 PM   Modules accepted: Orders

## 2022-07-20 NOTE — Progress Notes (Signed)
[facility] Cancer Follow up:    Pcp, No No address on file   DIAGNOSIS:  Cancer Staging  Cancer of tonsillar fossa (HCC) Staging form: Pharynx - HPV-Mediated Oropharynx, AJCC 8th Edition - Clinical stage from 04/25/2022: Stage III (cT3, cN3, cM0, p16+) - Unsigned Stage prefix: Initial diagnosis   SUMMARY OF ONCOLOGIC HISTORY: Oncology History  Cancer of tonsillar fossa (HCC)  02/2022 Initial Diagnosis   The patient initially presented to a provider located in [place] this past February for evaluation of a new right neck mass and sore throat. He was subsequently referred to ENT in [place] and had a soft tissue neck CT performed on 03/03/22 which demonstrated a large left neck mass and a tonsillar mass. Involvement of the left soft palate and possible invasion of the left tongue base were also appreciated. The scanned report is partially unreadable due to low quality, however findings detailed a a 6.1 cm mass-like area of matted left level 2 and level 3 lymph nodes with areas of necrosis, and a 6.2 cm fatty neoplasm posterior to the right sternocleidomastoid muscle.    03/15/2022 Initial Biopsy   Biopsy of the left neck mass: metastatic squamous cell carcinoma; p16 positive.    04/28/2022 PET scan   IMPRESSION: 1. 3.6 by 2.5 by 4.8 cm left tonsillar mass extending into the epiglottis, maximum SUV 13.9, compatible with malignancy. 2. Bulky conglomerate left level IIA adenopathy is hypermetabolic and compatible with malignancy. 3. 5.0 by 2.7 cm fatty mass along the superficial fascia margin of the right posterior neck, with internal stranding and mild internal nodularity. Low-grade associated activity, maximum SUV 1.4. Possibilities include low-grade liposarcoma and atypical lipoma. 4. Hypermetabolic activity along an exophytic density in the vicinity of the distal anal canal and perianal region, maximum SUV 8.6. Exophytic anal mass such as squamous cell carcinoma  not excluded. Visual inspection and digital rectal exam recommended for further assessment. 5. Severe right degenerative glenohumeral arthropathy with free osteochondral fragment in the subscapular recess of the joint and a smaller free fragment in the biceps recess of the joint, with suspected right glenohumeral joint effusion. There is also severe asymmetric degenerative right hip arthropathy. Mesoacromial os acromiale bilaterally, with associated degenerative findings on the right side. 6. Aortic atherosclerosis. 7. Emphysema.   Aortic Atherosclerosis (ICD10-I70.0) and Emphysema (ICD10-J43.9).     Electronically Signed   By: Gaylyn Rong M.D.   On: 04/28/2022 10:26   05/08/2022 - 07/21/2022 Radiation Therapy   Concurrent radiation   06/09/2022 Concurrent Chemotherapy   Cisplatin weekly x 3, discontinued due to pneumonia.  Given weekly 5/16-5/31/2024.     CURRENT THERAPY: radiation (chemotherapy discontinued)  INTERVAL HISTORY: Maxten Shuler 62 y.o. male returns for follow-up as he completes his radiation therapy for his head neck cancer.  He was hospitalized from June 15 through June 21 due to multifocal pneumonia requiring IV antibiotics, he continues on radiation his chemotherapy was discontinued as he was neutropenic and then was admitted for pneumonia.  Berton tells me that he has been feeling well since his discharge from the hospital.  He notes that he is eating more and has been able to gain up to 146 pounds.  He still has pain in his throat that he takes oxycodone 10 mg every 4 hours to help with.  He denies any constipation or increased somnolence with his medication.   Patient Active Problem List   Diagnosis Date Noted   Pressure injury of skin 06/28/2022   Shortness of  breath 06/27/2022   Multifocal pneumonia 06/26/2022   Pneumonia of lower lobe due to infectious organism 06/25/2022   Acute exacerbation of CHF (congestive heart failure) (HCC) 06/24/2022    Port-A-Cath in place 05/25/2022   Cancer of tonsillar fossa (HCC) 04/25/2022   SCC (squamous cell carcinoma) 04/24/2022   Normocytic anemia 04/24/2022   Chronic back pain 02/24/2020   Ileus (HCC) 03/24/2019   MDD (major depressive disorder), recurrent episode (HCC) 04/20/2018   Chronic right-sided low back pain 04/16/2018   Weakness of right lower extremity 04/16/2018   Healthcare maintenance 02/04/2018   Blood loss anemia 01/28/2018   Rectal bleeding 01/28/2018   Alcohol dependence with alcohol-induced mood disorder (HCC)    Major depressive disorder, recurrent severe without psychotic features (HCC) 08/01/2017   Dysphagia 07/08/2017   Stenosis of cervical spine with myelopathy (HCC) 07/06/2017   Left-sided weakness 06/27/2017   Tricompartment osteoarthritis of right knee 03/27/2017   Alcohol abuse with alcohol-induced mood disorder (HCC) 09/25/2016   Polysubstance dependence (HCC) 09/25/2016   Substance use disorder 09/19/2016   MDD (major depressive disorder), recurrent severe, without psychosis (HCC) 09/18/2016   Cocaine abuse (HCC) 07/27/2015   Suicidal ideation 07/27/2015   Cocaine dependence with intoxication with complication (HCC) 07/23/2015   Feeling like committing suicide 07/23/2015   Metabolic acidosis 07/23/2015   Tobacco abuse 07/23/2015   Substance induced mood disorder (HCC) 07/19/2015   Bipolar 1 disorder, depressed (HCC) 07/18/2015   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   Hemorrhoids    HIV (human immunodeficiency virus infection) (HCC)    Stenosis of cervical spine    withb myelopathy    SURGICAL HISTORY: Past Surgical History:  Procedure Laterality Date   ANTERIOR CERVICAL DECOMP/DISCECTOMY FUSION N/A 07/06/2017   Procedure: ANTERIOR CERVICAL  DECOMPRESSION/DISCECTOMY FUSION C3-4/C4-5 2 LEVELS;  Surgeon: Lisbeth Renshaw, MD;  Location: [facility] OR;  Service: Neurosurgery;  Laterality: N/A;   HERNIA REPAIR  97   lt ing   IR IMAGING GUIDED PORT INSERTION  05/19/2022   RECTAL EXAM UNDER ANESTHESIA N/A 04/24/2014   Procedure:  ligation of bleeding vessels;  Surgeon: Almond Lint, MD;  Location: [facility];  Service: General;  Laterality: N/A;   TENDON REPAIR     2006-lt index   WART FULGURATION  07/06/2011   Procedure: FULGURATION ANAL WART;  Surgeon: Shelly Rubenstein, MD;  Location: [facility];  Service: General;  Laterality: N/A;  excision anal condyloma   WART FULGURATION N/A 04/23/2014   Procedure: EXCISION OF ANAL CONDYLOMA;  Surgeon: Abigail Miyamoto, MD;  Location: [facility];  Service: General;  Laterality: N/A;    SOCIAL HISTORY: Social History   Socioeconomic History   Marital status: Single    Spouse name: Not on file   Number of children: Not on file   Years of education: Not on file   Highest education level: Not on file  Occupational History   Occupation: Unemployed  Tobacco Use   Smoking status: Every Day    Current packs/day: 0.50    Average packs/day: 0.5 packs/day for 25.0 years (12.5 ttl pk-yrs)    Types: Cigarettes   Smokeless tobacco: Never  Vaping Use   Vaping status: Never Used  Substance and Sexual Activity   Alcohol use: Yes    Alcohol/week: 24.0 standard drinks of alcohol  Types: 24 Cans of beer per week    Comment: ''I can drink a case a day''   Drug use: Yes    Types: "Crack" cocaine    Comment: pt reports last use was Wednesday   Sexual activity: Not Currently    Comment: pt declined condoms 09/22/19  Other Topics Concern   Not on file  Social History Narrative   Pt lives alone; receives [employer] surgeon.   Social Determinants of Health   Financial Resource Strain: Not on file  Food Insecurity: Low Risk  (07/05/2022)   [facility]   Food vital  sign    Within the past 12 months, you worried that your food would run out before you got money to buy more: Never true    Within the past 12 months, the food you bought just didn't last and you didn't have money to get more. : Never true  Recent Concern: Food Insecurity - High Risk (07/02/2022)   Received from [facility]   Food vital sign    Within the past 12 months, you worried that your food would run out before you got money to buy more: Often true    Within the past 12 months, the food you bought just didn't last and you didn't have money to get more. : Never true  Transportation Needs: No Transportation Needs (07/05/2022)   [facility]   Transportation    In the past 12 months, has lack of reliable transportation kept you from medical appointments, meetings, work or from getting things needed for daily living? : No  Recent Concern: Transportation Needs - Unmet Transportation Needs (07/02/2022)   Received from [facility]    In the past 12 months, has lack of reliable transportation kept you from medical appointments, meetings, work or from getting things needed for daily living? : Yes  Physical Activity: Not on file  Stress: Not on file  Social Connections: Unknown (05/22/2021)   Received from [facility]   Social Network    Social Network: Not on file  Intimate Partner Violence: Not At Risk (06/25/2022)   Humiliation, Afraid, Rape, and Kick questionnaire    Fear of Current or Ex-Partner: No    Emotionally Abused: No    Physically Abused: No    Sexually Abused: No    FAMILY HISTORY: Family History  Problem Relation Age of Onset   Cancer Mother        brain   Cancer Sister        breast    Review of Systems  Constitutional:  Negative for appetite change, chills, fatigue, fever and unexpected weight change.  HENT:   Negative for hearing loss, lump/mass, mouth sores and trouble swallowing.   Eyes:  Negative for eye  problems and icterus.  Respiratory:  Negative for chest tightness, cough and shortness of breath.   Cardiovascular:  Negative for chest pain, leg swelling and palpitations.  Gastrointestinal:  Negative for abdominal distention, abdominal pain, constipation, diarrhea, nausea and vomiting.  Endocrine: Negative for hot flashes.  Genitourinary:  Negative for difficulty urinating.   Musculoskeletal:  Negative for arthralgias.  Skin:  Negative for itching and rash.  Neurological:  Negative for dizziness, extremity weakness, headaches and numbness.  Hematological:  Negative for adenopathy. Does not bruise/bleed easily.  Psychiatric/Behavioral:  Negative for depression. The patient is not nervous/anxious.       PHYSICAL EXAMINATION    Vitals:   07/20/22  1323  BP: (!) 146/82  Pulse: 72  Resp: 18  Temp: 97.7 F (36.5 C)  SpO2: 100%    Physical Exam Constitutional:      General: He is not in acute distress.    Appearance: Normal appearance. He is not toxic-appearing.  HENT:     Head: Normocephalic and atraumatic.     Mouth/Throat:     Mouth: Mucous membranes are moist.     Pharynx: Oropharynx is clear. No oropharyngeal exudate or posterior oropharyngeal erythema.  Eyes:     General: No scleral icterus. Cardiovascular:     Rate and Rhythm: Normal rate and regular rhythm.     Pulses: Normal pulses.     Heart sounds: Normal heart sounds.  Pulmonary:     Effort: Pulmonary effort is normal.     Breath sounds: Normal breath sounds.  Abdominal:     General: Abdomen is flat. Bowel sounds are normal. There is no distension.     Palpations: Abdomen is soft.     Tenderness: There is no abdominal tenderness.  Musculoskeletal:        General: No swelling.     Cervical back: Neck supple.  Lymphadenopathy:     Cervical: No cervical adenopathy.  Skin:    General: Skin is warm and dry.     Findings: No rash.  Neurological:     General: No focal deficit present.     Mental Status: He  is alert.  Psychiatric:        Mood and Affect: Mood normal.        Behavior: Behavior normal.     LABORATORY DATA:  CBC    Component Value Date/Time   WBC 4.9 07/20/2022 1135   WBC 5.8 06/30/2022 0330   RBC 2.92 (L) 07/20/2022 1135   HGB 8.0 (L) 07/20/2022 1135   HCT 25.8 (L) 07/20/2022 1135   PLT 303 07/20/2022 1135   MCV 88.4 07/20/2022 1135   MCH 27.4 07/20/2022 1135   MCHC 31.0 07/20/2022 1135   RDW 20.6 (H) 07/20/2022 1135   LYMPHSABS 0.6 (L) 07/20/2022 1135   MONOABS 0.6 07/20/2022 1135   EOSABS 0.1 07/20/2022 1135   BASOSABS 0.0 07/20/2022 1135    CMP     Component Value Date/Time   NA 138 07/20/2022 1135   K 3.5 07/20/2022 1135   CL 103 07/20/2022 1135   CO2 30 07/20/2022 1135   GLUCOSE 62 (L) 07/20/2022 1135   BUN 13 07/20/2022 1135   CREATININE 0.82 07/20/2022 1135   CREATININE 1.07 03/01/2021 1649   CALCIUM 8.8 (L) 07/20/2022 1135   PROT 6.4 (L) 06/30/2022 0330   ALBUMIN 2.5 (L) 06/30/2022 0330   AST 22 06/30/2022 0330   AST 42 (H) 06/06/2022 1522   ALT 22 06/30/2022 0330   ALT 51 (H) 06/06/2022 1522   ALKPHOS 74 06/30/2022 0330   BILITOT 0.5 06/30/2022 0330   BILITOT 0.7 06/06/2022 1522   GFRNONAA >60 07/20/2022 1135   GFRNONAA 86 09/22/2019 1121   GFRAA 99 09/22/2019 1121       ASSESSMENT and THERAPY PLAN:   Cancer of tonsillar fossa (HCC) [name] is a 62 year old man with 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  Squamous cell carcinoma: He finishes up radiation tomorrow.  I let him know that he will get a posttreatment PET scan.  I recommended that he keep his port until his posttreatment PET scan has resulted so that if he needs additional treatment we  are able to use his port again. Dysphagia and odynophagia: He is eating and drinking well.  He continues on oxycodone which I refilled today.  I recommended that he try to decrease his intake of the oxycodone to 1 tablet every 4 hours as needed as there is a  finite time we will be able to continue his pain medicine as he heals from his treatment. Recent hospitalization: His labs today look good.  He appears to be breathing well.  Per his hospital discharge summary he was recommended to see infectious disease around July 19.  I sent a message to Dr. Ilsa Iha about his appointment because I do not see 1 scheduled.  Taris will return in 4 weeks for labs a port flush and follow-up.  All questions were answered. The patient knows to call the clinic with any problems, questions or concerns. We can certainly see the patient much sooner if necessary.  Total encounter time:30 minutes*in face-to-face visit time, chart review, lab review, care coordination, order entry, and documentation of the encounter time.    Lillard Anes, NP 07/20/22 2:30 PM Medical Oncology and Hematology [facility] [address] [place], [place] [address] Tel. [phone]    Fax. [phone]  *Total Encounter Time as defined by the Centers for Medicare and Medicaid Services includes, in addition to the face-to-face time of a patient visit (documented in the note above) non-face-to-face time: obtaining and reviewing outside history, ordering and reviewing medications, tests or procedures, care coordination (communications with other health care professionals or caregivers) and documentation in the medical record.

## 2022-07-20 NOTE — Assessment & Plan Note (Signed)
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  Squamous cell carcinoma: He finishes up radiation tomorrow.  I let him know that he will get a posttreatment PET scan.  I recommended that he keep his port until his posttreatment PET scan has resulted so that if he needs additional treatment we are able to use his port again. Dysphagia and odynophagia: He is eating and drinking well.  He continues on oxycodone which I refilled today.  I recommended that he try to decrease his intake of the oxycodone to 1 tablet every 4 hours as needed as there is a finite time we will be able to continue his pain medicine as he heals from his treatment. Recent hospitalization: His labs today look good.  He appears to be breathing well.  Per his hospital discharge summary he was recommended to see infectious disease around July 19.  I sent a message to Dr. Ilsa Iha about his appointment because I do not see 1 scheduled.  [name] will return in 4 weeks for labs a port flush and follow-up.

## 2022-07-21 ENCOUNTER — Other Ambulatory Visit: Payer: Self-pay

## 2022-07-21 ENCOUNTER — Ambulatory Visit
Admission: RE | Admit: 2022-07-21 | Discharge: 2022-07-21 | Disposition: A | Discharge status: Home / Self Care | Payer: Medicare Other | Source: Ambulatory Visit | Attending: Radiation Oncology | Admitting: Radiation Oncology

## 2022-07-21 ENCOUNTER — Telehealth: Payer: Self-pay | Admitting: Adult Health

## 2022-07-21 DIAGNOSIS — Z510 Encounter for antineoplastic radiation therapy: Secondary | ICD-10-CM | POA: Diagnosis not present

## 2022-07-21 LAB — RAD ONC ARIA SESSION SUMMARY
Course Elapsed Days: 74
Plan Fractions Treated to Date: 4
Plan Prescribed Dose Per Fraction: 2 Gy
Plan Total Fractions Prescribed: 4
Plan Total Prescribed Dose: 8 Gy
Reference Point Dosage Given to Date: 28 Gy
Reference Point Session Dosage Given: 2 Gy
Session Number: 36

## 2022-07-21 NOTE — Telephone Encounter (Signed)
Scheduled appointments per 7/11 los. Left voicemail for patient.

## 2022-07-24 NOTE — Radiation Completion Notes (Signed)
Patient Name: [name], [name] MRN: [id number] Date of Birth: 12-Mar-1960 Referring Physician: [name], M.D. Date of Service: 2022-07-24 Radiation Oncologist: Lonie Peak, M.D. [facility]                             RADIATION ONCOLOGY END OF TREATMENT NOTE     Diagnosis: C09.0 Malignant neoplasm of tonsillar fossa Staging on 2022-04-25: Cancer of tonsillar fossa (HCC) T=cT3, N=cN3, M=cM0 Intent: Curative     ==========DELIVERED PLANS==========  First Treatment Date: 2022-05-08 - Last Treatment Date: 2022-07-21   Plan Name: HN_L_Tonsil Site: Tonsil, Left Technique: IMRT Mode: Photon Dose Per Fraction: 2 Gy Prescribed Dose (Delivered / Prescribed): 44 Gy / 70 Gy Prescribed Fxs (Delivered / Prescribed): 22 / 35   Plan Name: HN_L_TonResim Site: Tonsil, Left Technique: IMRT Mode: Photon Dose Per Fraction: 2 Gy Prescribed Dose (Delivered / Prescribed): 20 Gy / 20 Gy Prescribed Fxs (Delivered / Prescribed): 10 / 10   Plan Name: HN_L_TonResim:1 Site: Tonsil, Left Technique: IMRT Mode: Photon Dose Per Fraction: 2 Gy Prescribed Dose (Delivered / Prescribed): 8 Gy / 8 Gy Prescribed Fxs (Delivered / Prescribed): 4 / 4     ==========ON TREATMENT VISIT DATES========== 2022-05-08, 2022-05-15, 2022-05-23, 2022-05-29, 2022-06-06, 2022-06-12, 2022-06-26, 2022-07-18     ==========UPCOMING VISITS==========       ==========APPENDIX - ON TREATMENT VISIT NOTES==========   See weekly On Treatment Notes in Epic for details.

## 2022-07-25 ENCOUNTER — Ambulatory Visit: Payer: Self-pay | Admitting: Radiation Oncology

## 2022-07-25 NOTE — Progress Notes (Signed)
Appointment for patient's 3:30pm-08/08/2022 appointment canceled, due to pt. being admitted to the [facility] ER in [place] on 08/08/2022.   Ruel Favors, LPN

## 2022-07-28 ENCOUNTER — Other Ambulatory Visit: Payer: Self-pay | Admitting: Hematology and Oncology

## 2022-07-28 DIAGNOSIS — G893 Neoplasm related pain (acute) (chronic): Secondary | ICD-10-CM

## 2022-07-28 DIAGNOSIS — C090 Malignant neoplasm of tonsillar fossa: Secondary | ICD-10-CM

## 2022-07-28 MED ORDER — OXYCODONE HCL 5 MG PO TABS
5.0000 mg | ORAL_TABLET | ORAL | 0 refills | Status: DC | PRN
Start: 2022-07-28 — End: 2022-08-04

## 2022-08-01 NOTE — Progress Notes (Signed)
Erroneous encounter-disregard

## 2022-08-02 ENCOUNTER — Encounter: Payer: Medicare Other | Admitting: Family

## 2022-08-02 ENCOUNTER — Ambulatory Visit: Payer: Medicare Other | Admitting: Family

## 2022-08-02 DIAGNOSIS — Z7689 Persons encountering health services in other specified circumstances: Secondary | ICD-10-CM

## 2022-08-03 ENCOUNTER — Telehealth: Payer: Self-pay | Admitting: *Deleted

## 2022-08-03 DIAGNOSIS — F339 Major depressive disorder, recurrent, unspecified: Secondary | ICD-10-CM

## 2022-08-03 DIAGNOSIS — B20 Human immunodeficiency virus [HIV] disease: Secondary | ICD-10-CM

## 2022-08-03 DIAGNOSIS — F332 Major depressive disorder, recurrent severe without psychotic features: Secondary | ICD-10-CM

## 2022-08-03 DIAGNOSIS — C4492 Squamous cell carcinoma of skin, unspecified: Secondary | ICD-10-CM

## 2022-08-03 DIAGNOSIS — Z720 Tobacco use: Secondary | ICD-10-CM

## 2022-08-03 DIAGNOSIS — C090 Malignant neoplasm of tonsillar fossa: Secondary | ICD-10-CM

## 2022-08-03 MED ORDER — BUPROPION HCL ER (XL) 300 MG PO TB24
300.0000 mg | ORAL_TABLET | Freq: Every day | ORAL | 0 refills | Status: AC
Start: 2022-08-03 — End: ?

## 2022-08-03 MED ORDER — LIDOCAINE VISCOUS HCL 2 % MT SOLN
OROMUCOSAL | 3 refills | Status: AC
Start: 2022-08-03 — End: ?

## 2022-08-03 MED ORDER — VALACYCLOVIR HCL 500 MG PO TABS
500.0000 mg | ORAL_TABLET | Freq: Two times a day (BID) | ORAL | 0 refills | Status: AC
Start: 2022-08-03 — End: ?

## 2022-08-03 MED ORDER — GABAPENTIN 800 MG PO TABS
800.0000 mg | ORAL_TABLET | Freq: Three times a day (TID) | ORAL | 0 refills | Status: AC
Start: 2022-08-03 — End: ?

## 2022-08-03 NOTE — Telephone Encounter (Signed)
This RN spoke with pt per his call stating his pain is not being well controlled with the current dosing of the Oxy ir.  Per discussion - reviewed with pt concern for pain issues due to completion of therapy with goal to be able to lower pain medication for his throat.  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   This RN discussed need to use other medications that can be causing throat discomfort - which when reviewed his MAR - he has not been given refills on several medications since being discharged and then relocating to [place].  He is out of the oral lidocaine, gabapentin and bupropion xl- he does have his Symtuza and is able to get refills of it.  He is scheduled to be established for primary care on 08/14/2022 as well as plan to now proceed to hip replacement.  [name] will also try coconut oil for oral moisturizing which he says is a source of his pain.  [name] understands if pain in his throat is not controlled well we would need to see him for evaluation for appropriate narcotic pain med changes.   Refills obtained for supportive meds and sent to verified pharmacy.  No further needs at this time.

## 2022-08-04 ENCOUNTER — Telehealth: Payer: Self-pay | Admitting: *Deleted

## 2022-08-04 ENCOUNTER — Other Ambulatory Visit: Payer: Self-pay | Admitting: Adult Health

## 2022-08-04 DIAGNOSIS — C090 Malignant neoplasm of tonsillar fossa: Secondary | ICD-10-CM

## 2022-08-04 DIAGNOSIS — G893 Neoplasm related pain (acute) (chronic): Secondary | ICD-10-CM

## 2022-08-04 MED ORDER — OXYCODONE HCL 5 MG PO TABS
5.0000 mg | ORAL_TABLET | Freq: Three times a day (TID) | ORAL | 0 refills | Status: AC
Start: 2022-08-04 — End: ?

## 2022-08-04 NOTE — Telephone Encounter (Signed)
This RN spoke with pt per his call today stating he forgot to ask for the Oxy IR to be refilled per call yesterday.  Informed him refill is being sent but Mardella Layman has lowered the dosing which is expected per his head and neck cancer regimen.  Reviewed issues discussed yesterday where he feels his pain is not well controlled but also that he was not taking (didn't have refills) on his other supportive meds,  Note he has not picked them up yet from the pharmacy.  This RN again reviewed that this office is treating his head and neck cancer and the pain associated with it. Reiterated that his pain from the AVM hip - is an issue that we are not able to manage - discussed hopefully with re initiation of supportive meds his overall pain is better controled until he can be seen by primary MD.

## 2022-08-04 NOTE — Progress Notes (Signed)
Placed order for refill of oxycodone.  Changed recommendations to 1 tab po every 8 hours PRN pain.  Disp 45, no refills. He should be tapering off of this for his cancer and treatment related pain.    Lillard Anes, NP 08/04/22 10:19 AM Medical Oncology and Hematology [facility] [address] [place], [place] [address] Tel. [phone]    Fax. [phone]

## 2022-08-08 ENCOUNTER — Ambulatory Visit
Admission: RE | Admit: 2022-08-08 | Discharge: 2022-08-08 | Disposition: A | Discharge status: Home / Self Care | Payer: Medicare Other | Source: Ambulatory Visit | Attending: Radiation Oncology | Admitting: Radiation Oncology

## 2022-08-17 ENCOUNTER — Inpatient Hospital Stay: Payer: 59

## 2022-08-17 ENCOUNTER — Encounter: Payer: Self-pay | Admitting: Adult Health

## 2022-08-17 ENCOUNTER — Inpatient Hospital Stay: Payer: 59 | Attending: Licensed Clinical Social Worker | Admitting: Adult Health

## 2022-08-17 ENCOUNTER — Telehealth: Payer: Self-pay | Admitting: *Deleted

## 2022-08-17 NOTE — Telephone Encounter (Signed)
VM left on this RN's phone from pt stating he is currently in the hospital - note connection was weak with multiple impairment of communication.  Per [facility] noted pt admitted to [facility] in [place].  Note pt had PET scan - results available.  This message will be forwarded to navigator and provider for review and appropriate follow up.

## 2022-08-28 ENCOUNTER — Inpatient Hospital Stay
Admission: RE | Admit: 2022-08-28 | Discharge: 2022-08-28 | Disposition: A | Discharge status: Home / Self Care | Payer: Self-pay | Source: Ambulatory Visit | Attending: Radiation Oncology | Admitting: Radiation Oncology

## 2022-08-28 ENCOUNTER — Other Ambulatory Visit: Payer: Self-pay

## 2022-08-28 DIAGNOSIS — C090 Malignant neoplasm of tonsillar fossa: Secondary | ICD-10-CM

## 2022-08-29 ENCOUNTER — Ambulatory Visit: Payer: Medicare Other | Admitting: Radiation Oncology

## 2022-09-01 ENCOUNTER — Ambulatory Visit: Payer: 59 | Admitting: Radiation Oncology

## 2022-09-01 ENCOUNTER — Telehealth: Payer: Self-pay

## 2022-09-01 NOTE — Telephone Encounter (Signed)
Noticed patient had not checked in for his 14:20 F/U appointment with Dr. Basilio Cairo. Called and left a VM inquiring if [facility] would still be able [facility] before [facility]:00, or if he would need to reschedule appointment. Direct call back number provided.

## 2022-09-10 ENCOUNTER — Other Ambulatory Visit: Payer: Self-pay | Admitting: Radiation Oncology

## 2022-09-10 DIAGNOSIS — B20 Human immunodeficiency virus [HIV] disease: Secondary | ICD-10-CM

## 2022-09-10 DIAGNOSIS — C090 Malignant neoplasm of tonsillar fossa: Secondary | ICD-10-CM

## 2022-09-12 NOTE — Telephone Encounter (Signed)
Patient moved to [place], [place]; Prescription to be managed by patient's PCP

## 2022-09-14 ENCOUNTER — Inpatient Hospital Stay: Payer: 59 | Attending: Licensed Clinical Social Worker

## 2022-10-04 ENCOUNTER — Encounter: Payer: Self-pay | Admitting: Adult Health

## 2022-10-12 ENCOUNTER — Inpatient Hospital Stay: Payer: 59 | Admitting: Hematology and Oncology

## 2022-10-12 ENCOUNTER — Inpatient Hospital Stay: Payer: 59

## 2023-02-26 ENCOUNTER — Other Ambulatory Visit: Payer: Self-pay

## 2023-02-26 DIAGNOSIS — C090 Malignant neoplasm of tonsillar fossa: Secondary | ICD-10-CM

## 2023-02-27 ENCOUNTER — Encounter: Payer: Self-pay | Admitting: Adult Health

## 2023-02-28 ENCOUNTER — Ambulatory Visit (HOSPITAL_COMMUNITY)
Admission: RE | Admit: 2023-02-28 | Discharge: 2023-02-28 | Discharge status: Home / Self Care | Payer: 59 | Source: Ambulatory Visit | Attending: Hematology and Oncology

## 2023-02-28 ENCOUNTER — Encounter: Payer: Self-pay | Admitting: Adult Health

## 2023-02-28 ENCOUNTER — Other Ambulatory Visit (HOSPITAL_COMMUNITY): Payer: Self-pay

## 2023-02-28 DIAGNOSIS — C090 Malignant neoplasm of tonsillar fossa: Secondary | ICD-10-CM | POA: Diagnosis present

## 2023-02-28 HISTORY — PX: IR REMOVAL TUN ACCESS W/ PORT W/O FL MOD SED: IMG2290

## 2023-02-28 MED ORDER — LIDOCAINE HCL 1 % IJ SOLN
INTRAMUSCULAR | Status: AC
  Filled 2023-02-28: qty 20

## 2023-02-28 MED ORDER — LIDOCAINE-EPINEPHRINE 1 %-1:100000 IJ SOLN
20.0000 mL | Freq: Once | INTRAMUSCULAR | Status: AC
  Administered 2023-02-28: 10 mL via INTRADERMAL

## 2023-02-28 MED ORDER — LIDOCAINE-EPINEPHRINE 1 %-1:100000 IJ SOLN
INTRAMUSCULAR | Status: AC
  Filled 2023-02-28: qty 1

## 2023-02-28 MED ORDER — HYDROCODONE-ACETAMINOPHEN 5-325 MG PO TABS
1.0000 | ORAL_TABLET | Freq: Four times a day (QID) | ORAL | 0 refills | Status: AC | PRN
Start: 2023-02-28 — End: ?
  Filled 2023-02-28: qty 8, 1d supply, fill #0

## 2023-02-28 NOTE — Progress Notes (Signed)
Patient ID: [name], male   DOB: January 01, 1961, 63 y.o.   MRN: [id number] Per order of Dr. Miles Costain, electronic prescription for Norco 5/325, # 8, no refills, 1-2 tabs every 6 hours sent to [facility]

## 2023-02-28 NOTE — Procedures (Signed)
Interventional Radiology Procedure Note  Procedure: RT internal jugular POWER PORT REMOVAL    Complications: None  Estimated Blood Loss:  MIN  Findings: FULL REPORT IN PACS     Sharen Counter, MD

## 2023-03-15 ENCOUNTER — Other Ambulatory Visit (HOSPITAL_COMMUNITY): Payer: 59
